# Patient Record
Sex: Male | Born: 1960
Health system: Southern US, Community
[De-identification: ages and names within clinical notes are randomized; demographics above are authoritative.]

## PROBLEM LIST (undated history)

## (undated) DIAGNOSIS — N183 Chronic kidney disease, stage 3 unspecified: Secondary | ICD-10-CM

## (undated) DIAGNOSIS — I255 Ischemic cardiomyopathy: Secondary | ICD-10-CM

## (undated) DIAGNOSIS — I10 Essential (primary) hypertension: Secondary | ICD-10-CM

## (undated) DIAGNOSIS — I951 Orthostatic hypotension: Secondary | ICD-10-CM

## (undated) DIAGNOSIS — E119 Type 2 diabetes mellitus without complications: Secondary | ICD-10-CM

## (undated) DIAGNOSIS — E1140 Type 2 diabetes mellitus with diabetic neuropathy, unspecified: Secondary | ICD-10-CM

## (undated) DIAGNOSIS — I2510 Atherosclerotic heart disease of native coronary artery without angina pectoris: Secondary | ICD-10-CM

## (undated) DIAGNOSIS — I739 Peripheral vascular disease, unspecified: Secondary | ICD-10-CM

## (undated) DIAGNOSIS — I5022 Chronic systolic (congestive) heart failure: Secondary | ICD-10-CM

## (undated) DIAGNOSIS — E7800 Pure hypercholesterolemia, unspecified: Secondary | ICD-10-CM

## (undated) DIAGNOSIS — T148XXA Other injury of unspecified body region, initial encounter: Secondary | ICD-10-CM

## (undated) DIAGNOSIS — D649 Anemia, unspecified: Secondary | ICD-10-CM

## (undated) DIAGNOSIS — L97509 Non-pressure chronic ulcer of other part of unspecified foot with unspecified severity: Secondary | ICD-10-CM

## (undated) DIAGNOSIS — E11621 Type 2 diabetes mellitus with foot ulcer: Secondary | ICD-10-CM

## (undated) DIAGNOSIS — I119 Hypertensive heart disease without heart failure: Secondary | ICD-10-CM

## (undated) DIAGNOSIS — I219 Acute myocardial infarction, unspecified: Secondary | ICD-10-CM

## (undated) DIAGNOSIS — I499 Cardiac arrhythmia, unspecified: Secondary | ICD-10-CM

## (undated) HISTORY — DX: Chronic kidney disease, stage 3 unspecified: N18.30

## (undated) HISTORY — PX: OTHER SURGICAL HISTORY: SHX169

## (undated) HISTORY — PX: CATARACT EXTRACTION: SUR2

## (undated) HISTORY — DX: Hypertensive heart disease without heart failure: I11.9

## (undated) HISTORY — DX: Chronic kidney disease, stage 3 (moderate): N18.3

## (undated) HISTORY — DX: Chronic systolic (congestive) heart failure: I50.22

## (undated) HISTORY — DX: Ischemic cardiomyopathy: I25.5

## (undated) HISTORY — PX: EYE SURGERY: SHX253

## (undated) HISTORY — DX: Atherosclerotic heart disease of native coronary artery without angina pectoris: I25.10

## (undated) HISTORY — DX: Type 2 diabetes mellitus without complications: E11.9

## (undated) HISTORY — PX: TONSILECTOMY/ADENOIDECTOMY WITH MYRINGOTOMY: SHX6125

## (undated) HISTORY — PX: TOE AMPUTATION: SHX809

## (undated) HISTORY — PX: CARDIAC CATHETERIZATION: SHX172

## (undated) HISTORY — DX: Essential (primary) hypertension: I10

## (undated) HISTORY — DX: Peripheral vascular disease, unspecified: I73.9

---

## 2013-07-18 LAB — CBC
HCT: 40.5 % (ref 40.0–52.0)
HGB: 13.7 g/dL (ref 13.0–18.0)
MCH: 30.2 pg (ref 26.0–34.0)
MCV: 89 fL (ref 80–100)
Platelet: 364 10*3/uL (ref 150–440)
RBC: 4.54 10*6/uL (ref 4.40–5.90)
RDW: 12.5 % (ref 11.5–14.5)
WBC: 23.4 10*3/uL — ABNORMAL HIGH (ref 3.8–10.6)

## 2013-07-18 LAB — COMPREHENSIVE METABOLIC PANEL
Alkaline Phosphatase: 101 U/L (ref 50–136)
BUN: 23 mg/dL — ABNORMAL HIGH (ref 7–18)
Chloride: 98 mmol/L (ref 98–107)
Creatinine: 1.18 mg/dL (ref 0.60–1.30)
EGFR (Non-African Amer.): >60
Glucose: 261 mg/dL — ABNORMAL HIGH (ref 65–99)
Potassium: 4 mmol/L (ref 3.5–5.1)
SGOT(AST): 11 U/L — ABNORMAL LOW (ref 15–37)
Sodium: 131 mmol/L — ABNORMAL LOW (ref 136–145)

## 2013-07-19 ENCOUNTER — Inpatient Hospital Stay: Payer: Self-pay | Admitting: Internal Medicine

## 2013-07-19 LAB — SEDIMENTATION RATE: Erythrocyte Sed Rate: 55 mm/hr — ABNORMAL HIGH (ref 0–20)

## 2013-07-19 LAB — HEMOGLOBIN A1C: Hemoglobin A1C: 12.2 % — ABNORMAL HIGH (ref 4.2–6.3)

## 2013-07-20 LAB — BASIC METABOLIC PANEL
Anion Gap: 6 — ABNORMAL LOW (ref 7–16)
Chloride: 100 mmol/L (ref 98–107)
Co2: 26 mmol/L (ref 21–32)
Creatinine: 0.96 mg/dL (ref 0.60–1.30)
EGFR (Non-African Amer.): >60
Glucose: 221 mg/dL — ABNORMAL HIGH (ref 65–99)
Sodium: 132 mmol/L — ABNORMAL LOW (ref 136–145)

## 2013-07-20 LAB — CBC WITH DIFFERENTIAL/PLATELET
Basophil #: 0 10*3/uL (ref 0.0–0.1)
Eosinophil #: 0.1 10*3/uL (ref 0.0–0.7)
HGB: 11.6 g/dL — ABNORMAL LOW (ref 13.0–18.0)
Lymphocyte %: 13.2 %
Monocyte #: 1.8 x10 3/mm — ABNORMAL HIGH (ref 0.2–1.0)
Monocyte %: 9.9 %
Neutrophil #: 13.6 10*3/uL — ABNORMAL HIGH (ref 1.4–6.5)
Platelet: 291 10*3/uL (ref 150–440)
WBC: 17.8 10*3/uL — ABNORMAL HIGH (ref 3.8–10.6)

## 2013-07-21 LAB — BASIC METABOLIC PANEL
Anion Gap: 8 (ref 7–16)
BUN: 13 mg/dL (ref 7–18)
Calcium, Total: 9.1 mg/dL (ref 8.5–10.1)
Chloride: 98 mmol/L (ref 98–107)
Co2: 26 mmol/L (ref 21–32)
Creatinine: 1.04 mg/dL (ref 0.60–1.30)
EGFR (African American): >60
Osmolality: 273 (ref 275–301)
Sodium: 132 mmol/L — ABNORMAL LOW (ref 136–145)

## 2013-07-21 LAB — CBC WITH DIFFERENTIAL/PLATELET
Basophil #: 0.1 10*3/uL (ref 0.0–0.1)
Eosinophil #: 0 10*3/uL (ref 0.0–0.7)
HGB: 12 g/dL — ABNORMAL LOW (ref 13.0–18.0)
Lymphocyte #: 1.8 10*3/uL (ref 1.0–3.6)
Lymphocyte %: 8.6 %
MCH: 31.2 pg (ref 26.0–34.0)
MCV: 87 fL (ref 80–100)
Monocyte %: 8.2 %
Neutrophil %: 82.7 %
Platelet: 296 10*3/uL (ref 150–440)
RBC: 3.86 10*6/uL — ABNORMAL LOW (ref 4.40–5.90)
RDW: 11.9 % (ref 11.5–14.5)
WBC: 21.3 10*3/uL — ABNORMAL HIGH (ref 3.8–10.6)

## 2013-07-21 LAB — VANCOMYCIN, TROUGH: Vancomycin, Trough: 22 ug/mL (ref 10–20)

## 2013-07-22 LAB — BASIC METABOLIC PANEL
Anion Gap: 7 (ref 7–16)
BUN: 16 mg/dL (ref 7–18)
Calcium, Total: 8.4 mg/dL — ABNORMAL LOW (ref 8.5–10.1)
Chloride: 99 mmol/L (ref 98–107)
Co2: 26 mmol/L (ref 21–32)
EGFR (Non-African Amer.): 42 — ABNORMAL LOW
Glucose: 200 mg/dL — ABNORMAL HIGH (ref 65–99)
Osmolality: 271 (ref 275–301)
Sodium: 132 mmol/L — ABNORMAL LOW (ref 136–145)

## 2013-07-22 LAB — VANCOMYCIN, TROUGH: Vancomycin, Trough: 33 ug/mL (ref 10–20)

## 2013-07-22 LAB — CBC WITH DIFFERENTIAL/PLATELET
Basophil #: 0.1 10*3/uL (ref 0.0–0.1)
Basophil %: 0.7 %
Eosinophil #: 0.1 10*3/uL (ref 0.0–0.7)
Eosinophil %: 0.3 %
HGB: 11.4 g/dL — ABNORMAL LOW (ref 13.0–18.0)
Lymphocyte #: 2.3 10*3/uL (ref 1.0–3.6)
MCH: 30 pg (ref 26.0–34.0)
MCHC: 34 g/dL (ref 32.0–36.0)
Monocyte #: 1.9 x10 3/mm — ABNORMAL HIGH (ref 0.2–1.0)
Platelet: 330 10*3/uL (ref 150–440)
RBC: 3.82 10*6/uL — ABNORMAL LOW (ref 4.40–5.90)
RDW: 12.3 % (ref 11.5–14.5)
WBC: 20.4 10*3/uL — ABNORMAL HIGH (ref 3.8–10.6)

## 2013-07-23 LAB — CBC WITH DIFFERENTIAL/PLATELET
Basophil %: 0.3 %
Eosinophil #: 0.1 10*3/uL (ref 0.0–0.7)
Eosinophil %: 0.8 %
HGB: 11.1 g/dL — ABNORMAL LOW (ref 13.0–18.0)
Lymphocyte #: 1.9 10*3/uL (ref 1.0–3.6)
Lymphocyte %: 10.9 %
MCV: 89 fL (ref 80–100)
Monocyte #: 1.8 x10 3/mm — ABNORMAL HIGH (ref 0.2–1.0)
Monocyte %: 10.3 %
Neutrophil #: 13.8 10*3/uL — ABNORMAL HIGH (ref 1.4–6.5)
Neutrophil %: 77.7 %
Platelet: 302 10*3/uL (ref 150–440)
RBC: 3.62 10*6/uL — ABNORMAL LOW (ref 4.40–5.90)
RDW: 12.6 % (ref 11.5–14.5)
WBC: 17.7 10*3/uL — ABNORMAL HIGH (ref 3.8–10.6)

## 2013-07-23 LAB — BASIC METABOLIC PANEL
Anion Gap: 8 (ref 7–16)
Co2: 24 mmol/L (ref 21–32)
Glucose: 127 mg/dL — ABNORMAL HIGH (ref 65–99)
Osmolality: 273 (ref 275–301)
Potassium: 4.9 mmol/L (ref 3.5–5.1)
Sodium: 133 mmol/L — ABNORMAL LOW (ref 136–145)

## 2013-07-23 LAB — CULTURE, BLOOD (SINGLE)

## 2013-07-24 LAB — COMPREHENSIVE METABOLIC PANEL
Albumin: 2 g/dL — ABNORMAL LOW (ref 3.4–5.0)
Alkaline Phosphatase: 99 U/L (ref 50–136)
Anion Gap: 10 (ref 7–16)
BUN: 35 mg/dL — ABNORMAL HIGH (ref 7–18)
Bilirubin,Total: 0.5 mg/dL (ref 0.2–1.0)
Chloride: 102 mmol/L (ref 98–107)
EGFR (African American): 20 — ABNORMAL LOW
EGFR (Non-African Amer.): 17 — ABNORMAL LOW
Glucose: 91 mg/dL (ref 65–99)
Potassium: 3.5 mmol/L (ref 3.5–5.1)
SGPT (ALT): 28 U/L (ref 12–78)
Sodium: 133 mmol/L — ABNORMAL LOW (ref 136–145)
Total Protein: 7.2 g/dL (ref 6.4–8.2)

## 2013-07-24 LAB — CBC WITH DIFFERENTIAL/PLATELET
Eosinophil #: 0.1 10*3/uL (ref 0.0–0.7)
Eosinophil %: 0.7 %
HCT: 34.1 % — ABNORMAL LOW (ref 40.0–52.0)
HGB: 11.6 g/dL — ABNORMAL LOW (ref 13.0–18.0)
MCV: 89 fL (ref 80–100)
Monocyte #: 1.5 x10 3/mm — ABNORMAL HIGH (ref 0.2–1.0)
Neutrophil #: 13.2 10*3/uL — ABNORMAL HIGH (ref 1.4–6.5)
Neutrophil %: 79.8 %
WBC: 16.5 10*3/uL — ABNORMAL HIGH (ref 3.8–10.6)

## 2013-07-24 LAB — VANCOMYCIN, RANDOM: Vancomycin, Random: 24 ug/mL

## 2013-07-25 LAB — URINALYSIS, COMPLETE
Bilirubin,UR: NEGATIVE
Glucose,UR: 50 mg/dL (ref 0–75)
Ketone: NEGATIVE
Leukocyte Esterase: NEGATIVE
Nitrite: NEGATIVE
Ph: 5 (ref 4.5–8.0)
Protein: 30
Specific Gravity: 1.008 (ref 1.003–1.030)
Squamous Epithelial: NONE SEEN
WBC UR: <1 /HPF (ref 0–5)

## 2013-07-25 LAB — BASIC METABOLIC PANEL
Anion Gap: 10 (ref 7–16)
BUN: 41 mg/dL — ABNORMAL HIGH (ref 7–18)
Chloride: 107 mmol/L (ref 98–107)
EGFR (African American): 17 — ABNORMAL LOW
EGFR (Non-African Amer.): 14 — ABNORMAL LOW
Potassium: 3.8 mmol/L (ref 3.5–5.1)

## 2013-07-25 LAB — CBC WITH DIFFERENTIAL/PLATELET
HGB: 11 g/dL — ABNORMAL LOW (ref 13.0–18.0)
Lymphocyte #: 1.5 10*3/uL (ref 1.0–3.6)
MCH: 30.3 pg (ref 26.0–34.0)
MCV: 88 fL (ref 80–100)
Monocyte #: 1.4 x10 3/mm — ABNORMAL HIGH (ref 0.2–1.0)
Neutrophil %: 80.4 %
RBC: 3.62 10*6/uL — ABNORMAL LOW (ref 4.40–5.90)
RDW: 12.8 % (ref 11.5–14.5)
WBC: 15.8 10*3/uL — ABNORMAL HIGH (ref 3.8–10.6)

## 2013-07-25 LAB — PROTEIN / CREATININE RATIO, URINE
Creatinine, Urine: 35.3 mg/dL (ref 30.0–125.0)
Protein, Random Urine: 35 mg/dL — ABNORMAL HIGH (ref 0–12)

## 2013-07-26 LAB — BASIC METABOLIC PANEL
BUN: 54 mg/dL — ABNORMAL HIGH (ref 7–18)
Calcium, Total: 9.2 mg/dL (ref 8.5–10.1)
Chloride: 104 mmol/L (ref 98–107)
Co2: 21 mmol/L (ref 21–32)
Creatinine: 4.88 mg/dL — ABNORMAL HIGH (ref 0.60–1.30)
EGFR (African American): 15 — ABNORMAL LOW
EGFR (Non-African Amer.): 13 — ABNORMAL LOW
Glucose: 130 mg/dL — ABNORMAL HIGH (ref 65–99)
Osmolality: 290 (ref 275–301)
Sodium: 137 mmol/L (ref 136–145)

## 2013-07-26 LAB — CBC WITH DIFFERENTIAL/PLATELET
Basophil #: 0.1 10*3/uL (ref 0.0–0.1)
Basophil %: 0.4 %
Eosinophil #: 0.2 10*3/uL (ref 0.0–0.7)
Eosinophil %: 1.4 %
HCT: 35.1 % — ABNORMAL LOW (ref 40.0–52.0)
Lymphocyte #: 2 10*3/uL (ref 1.0–3.6)
Lymphocyte %: 11.8 %
MCH: 29.8 pg (ref 26.0–34.0)
MCHC: 33.4 g/dL (ref 32.0–36.0)
Monocyte #: 1.2 x10 3/mm — ABNORMAL HIGH (ref 0.2–1.0)
Monocyte %: 7.1 %
Neutrophil #: 13.1 10*3/uL — ABNORMAL HIGH (ref 1.4–6.5)
Platelet: 439 10*3/uL (ref 150–440)
WBC: 16.6 10*3/uL — ABNORMAL HIGH (ref 3.8–10.6)

## 2013-07-27 LAB — CBC WITH DIFFERENTIAL/PLATELET
Basophil #: 0 10*3/uL (ref 0.0–0.1)
Eosinophil #: 0.3 10*3/uL (ref 0.0–0.7)
HCT: 31 % — ABNORMAL LOW (ref 40.0–52.0)
HGB: 10.4 g/dL — ABNORMAL LOW (ref 13.0–18.0)
MCH: 29.5 pg (ref 26.0–34.0)
MCHC: 33.5 g/dL (ref 32.0–36.0)
MCV: 88 fL (ref 80–100)
Monocyte %: 8.9 %
Neutrophil #: 12 10*3/uL — ABNORMAL HIGH (ref 1.4–6.5)
Platelet: 425 10*3/uL (ref 150–440)
RBC: 3.52 10*6/uL — ABNORMAL LOW (ref 4.40–5.90)
RDW: 12.5 % (ref 11.5–14.5)
WBC: 16.1 10*3/uL — ABNORMAL HIGH (ref 3.8–10.6)

## 2013-07-27 LAB — RENAL FUNCTION PANEL
Anion Gap: 11 (ref 7–16)
Calcium, Total: 8.3 mg/dL — ABNORMAL LOW (ref 8.5–10.1)
Co2: 22 mmol/L (ref 21–32)
Creatinine: 5.51 mg/dL — ABNORMAL HIGH (ref 0.60–1.30)
EGFR (African American): 13 — ABNORMAL LOW
Osmolality: 291 (ref 275–301)
Potassium: 3.7 mmol/L (ref 3.5–5.1)
Sodium: 137 mmol/L (ref 136–145)

## 2013-07-28 LAB — BASIC METABOLIC PANEL
BUN: 63 mg/dL — ABNORMAL HIGH (ref 7–18)
Calcium, Total: 8.3 mg/dL — ABNORMAL LOW (ref 8.5–10.1)
Co2: 20 mmol/L — ABNORMAL LOW (ref 21–32)
Glucose: 100 mg/dL — ABNORMAL HIGH (ref 65–99)
Potassium: 3.5 mmol/L (ref 3.5–5.1)

## 2013-07-28 LAB — CBC WITH DIFFERENTIAL/PLATELET
Basophil #: 0 10*3/uL (ref 0.0–0.1)
Basophil %: 0.2 %
Eosinophil #: 0.2 10*3/uL (ref 0.0–0.7)
Eosinophil %: 1.2 %
HCT: 30.6 % — ABNORMAL LOW (ref 40.0–52.0)
HGB: 10.3 g/dL — ABNORMAL LOW (ref 13.0–18.0)
Lymphocyte %: 13.5 %
Monocyte %: 8.9 %
Neutrophil %: 76.2 %
RBC: 3.47 10*6/uL — ABNORMAL LOW (ref 4.40–5.90)
RDW: 12.8 % (ref 11.5–14.5)

## 2013-07-28 LAB — WOUND CULTURE

## 2013-07-29 LAB — CBC WITH DIFFERENTIAL/PLATELET
Basophil #: 0.1 10*3/uL (ref 0.0–0.1)
Eosinophil %: 1.9 %
HCT: 32.8 % — ABNORMAL LOW (ref 40.0–52.0)
HGB: 11 g/dL — ABNORMAL LOW (ref 13.0–18.0)
MCH: 29.8 pg (ref 26.0–34.0)
MCHC: 33.6 g/dL (ref 32.0–36.0)
MCV: 89 fL (ref 80–100)
Monocyte #: 1 x10 3/mm (ref 0.2–1.0)
Monocyte %: 7.1 %

## 2013-07-29 LAB — BASIC METABOLIC PANEL
BUN: 67 mg/dL — ABNORMAL HIGH (ref 7–18)
Osmolality: 297 (ref 275–301)

## 2013-07-30 LAB — BASIC METABOLIC PANEL
Calcium, Total: 8.6 mg/dL (ref 8.5–10.1)
Creatinine: 5.4 mg/dL — ABNORMAL HIGH (ref 0.60–1.30)
EGFR (Non-African Amer.): 11 — ABNORMAL LOW
Glucose: 246 mg/dL — ABNORMAL HIGH (ref 65–99)
Osmolality: 301 (ref 275–301)
Potassium: 4.1 mmol/L (ref 3.5–5.1)
Sodium: 137 mmol/L (ref 136–145)

## 2013-07-31 LAB — CBC WITH DIFFERENTIAL/PLATELET
Basophil %: 0.7 %
Eosinophil #: 0.2 10*3/uL (ref 0.0–0.7)
Eosinophil %: 1.8 %
HGB: 10.8 g/dL — ABNORMAL LOW (ref 13.0–18.0)
Lymphocyte #: 2.7 10*3/uL (ref 1.0–3.6)
Lymphocyte %: 20.8 %
MCH: 29.8 pg (ref 26.0–34.0)
MCHC: 34 g/dL (ref 32.0–36.0)
MCV: 88 fL (ref 80–100)
Monocyte #: 1.2 x10 3/mm — ABNORMAL HIGH (ref 0.2–1.0)
Neutrophil #: 8.7 10*3/uL — ABNORMAL HIGH (ref 1.4–6.5)
Neutrophil %: 67.4 %
Platelet: 303 10*3/uL (ref 150–440)
RBC: 3.61 10*6/uL — ABNORMAL LOW (ref 4.40–5.90)
RDW: 13 % (ref 11.5–14.5)

## 2013-07-31 LAB — BASIC METABOLIC PANEL
Calcium, Total: 8.7 mg/dL (ref 8.5–10.1)
Creatinine: 5.13 mg/dL — ABNORMAL HIGH (ref 0.60–1.30)
Osmolality: 295 (ref 275–301)
Potassium: 3.7 mmol/L (ref 3.5–5.1)
Sodium: 138 mmol/L (ref 136–145)

## 2013-08-01 LAB — CBC WITH DIFFERENTIAL/PLATELET
Basophil #: 0.1 10*3/uL (ref 0.0–0.1)
Eosinophil #: 0.2 10*3/uL (ref 0.0–0.7)
HCT: 32.4 % — ABNORMAL LOW (ref 40.0–52.0)
HGB: 10.9 g/dL — ABNORMAL LOW (ref 13.0–18.0)
Lymphocyte #: 2.6 10*3/uL (ref 1.0–3.6)
Lymphocyte %: 20.4 %
Monocyte #: 1.2 x10 3/mm — ABNORMAL HIGH (ref 0.2–1.0)
Monocyte %: 8.9 %
Neutrophil %: 68.4 %
Platelet: 270 10*3/uL (ref 150–440)
WBC: 12.9 10*3/uL — ABNORMAL HIGH (ref 3.8–10.6)

## 2013-08-01 LAB — BASIC METABOLIC PANEL
Anion Gap: 9 (ref 7–16)
BUN: 69 mg/dL — ABNORMAL HIGH (ref 7–18)
Calcium, Total: 8.9 mg/dL (ref 8.5–10.1)
Chloride: 103 mmol/L (ref 98–107)
Co2: 26 mmol/L (ref 21–32)
EGFR (African American): 14 — ABNORMAL LOW
EGFR (Non-African Amer.): 12 — ABNORMAL LOW
Potassium: 3.9 mmol/L (ref 3.5–5.1)

## 2013-08-02 LAB — CBC WITH DIFFERENTIAL/PLATELET
Eosinophil #: 0.3 10*3/uL (ref 0.0–0.7)
Eosinophil %: 1.9 %
HCT: 30.5 % — ABNORMAL LOW (ref 40.0–52.0)
HGB: 10.3 g/dL — ABNORMAL LOW (ref 13.0–18.0)
Lymphocyte #: 2.7 10*3/uL (ref 1.0–3.6)
MCH: 29.8 pg (ref 26.0–34.0)
MCHC: 33.8 g/dL (ref 32.0–36.0)
MCV: 88 fL (ref 80–100)
Monocyte #: 1.3 x10 3/mm — ABNORMAL HIGH (ref 0.2–1.0)
Monocyte %: 8.9 %
Neutrophil #: 9.8 10*3/uL — ABNORMAL HIGH (ref 1.4–6.5)
Neutrophil %: 69.2 %
RBC: 3.45 10*6/uL — ABNORMAL LOW (ref 4.40–5.90)
WBC: 14.1 10*3/uL — ABNORMAL HIGH (ref 3.8–10.6)

## 2013-08-02 LAB — BASIC METABOLIC PANEL
Anion Gap: 9 (ref 7–16)
Chloride: 102 mmol/L (ref 98–107)
Co2: 26 mmol/L (ref 21–32)
Creatinine: 4.84 mg/dL — ABNORMAL HIGH (ref 0.60–1.30)
EGFR (Non-African Amer.): 13 — ABNORMAL LOW
Potassium: 3.9 mmol/L (ref 3.5–5.1)

## 2013-08-03 LAB — BASIC METABOLIC PANEL
Anion Gap: 7 (ref 7–16)
Chloride: 104 mmol/L (ref 98–107)
Sodium: 137 mmol/L (ref 136–145)

## 2013-08-03 LAB — CBC WITH DIFFERENTIAL/PLATELET
Basophil %: 0.8 %
Eosinophil #: 0.3 10*3/uL (ref 0.0–0.7)
Eosinophil %: 2.2 %
HGB: 10.8 g/dL — ABNORMAL LOW (ref 13.0–18.0)
Lymphocyte #: 2.4 10*3/uL (ref 1.0–3.6)
MCH: 30.2 pg (ref 26.0–34.0)
MCHC: 34.1 g/dL (ref 32.0–36.0)
MCV: 89 fL (ref 80–100)
Monocyte #: 1.1 x10 3/mm — ABNORMAL HIGH (ref 0.2–1.0)
Platelet: 246 10*3/uL (ref 150–440)
WBC: 11.6 10*3/uL — ABNORMAL HIGH (ref 3.8–10.6)

## 2013-08-04 LAB — BASIC METABOLIC PANEL
BUN: 67 mg/dL — ABNORMAL HIGH (ref 7–18)
Calcium, Total: 9 mg/dL (ref 8.5–10.1)
Chloride: 103 mmol/L (ref 98–107)
Co2: 28 mmol/L (ref 21–32)
Creatinine: 4.97 mg/dL — ABNORMAL HIGH (ref 0.60–1.30)
EGFR (African American): 15 — ABNORMAL LOW
EGFR (Non-African Amer.): 13 — ABNORMAL LOW
Glucose: 89 mg/dL (ref 65–99)
Osmolality: 295 (ref 275–301)
Sodium: 138 mmol/L (ref 136–145)

## 2013-08-04 LAB — CBC WITH DIFFERENTIAL/PLATELET
Basophil #: 0.1 10*3/uL (ref 0.0–0.1)
Basophil %: 0.9 %
Eosinophil #: 0.3 10*3/uL (ref 0.0–0.7)
Eosinophil %: 2.4 %
HCT: 31.7 % — ABNORMAL LOW (ref 40.0–52.0)
Lymphocyte #: 2.8 10*3/uL (ref 1.0–3.6)
MCH: 29.6 pg (ref 26.0–34.0)
MCHC: 33.4 g/dL (ref 32.0–36.0)
MCV: 89 fL (ref 80–100)
Monocyte #: 1.2 x10 3/mm — ABNORMAL HIGH (ref 0.2–1.0)
Monocyte %: 9.8 %
Neutrophil #: 7.6 10*3/uL — ABNORMAL HIGH (ref 1.4–6.5)
Neutrophil %: 63.8 %
Platelet: 257 10*3/uL (ref 150–440)
RBC: 3.57 10*6/uL — ABNORMAL LOW (ref 4.40–5.90)
WBC: 11.9 10*3/uL — ABNORMAL HIGH (ref 3.8–10.6)

## 2013-08-05 LAB — BASIC METABOLIC PANEL
Anion Gap: 6 — ABNORMAL LOW (ref 7–16)
BUN: 68 mg/dL — ABNORMAL HIGH (ref 7–18)
Calcium, Total: 8.8 mg/dL (ref 8.5–10.1)
Chloride: 102 mmol/L (ref 98–107)
Creatinine: 5.1 mg/dL — ABNORMAL HIGH (ref 0.60–1.30)
EGFR (Non-African Amer.): 12 — ABNORMAL LOW
Osmolality: 296 (ref 275–301)
Potassium: 3.9 mmol/L (ref 3.5–5.1)
Sodium: 137 mmol/L (ref 136–145)

## 2013-08-06 LAB — CBC WITH DIFFERENTIAL/PLATELET
Basophil %: 1.2 %
Eosinophil %: 2.5 %
HCT: 31.3 % — ABNORMAL LOW (ref 40.0–52.0)
HGB: 10.5 g/dL — ABNORMAL LOW (ref 13.0–18.0)
Lymphocyte #: 2.8 10*3/uL (ref 1.0–3.6)
Lymphocyte %: 27.6 %
MCH: 29.7 pg (ref 26.0–34.0)
MCHC: 33.6 g/dL (ref 32.0–36.0)
MCV: 89 fL (ref 80–100)
Monocyte #: 1 x10 3/mm (ref 0.2–1.0)
Platelet: 240 10*3/uL (ref 150–440)
RBC: 3.53 10*6/uL — ABNORMAL LOW (ref 4.40–5.90)
RDW: 12.8 % (ref 11.5–14.5)
WBC: 10.3 10*3/uL (ref 3.8–10.6)

## 2013-08-06 LAB — COMPREHENSIVE METABOLIC PANEL
Bilirubin,Total: 0.4 mg/dL (ref 0.2–1.0)
Calcium, Total: 8.9 mg/dL (ref 8.5–10.1)
Chloride: 102 mmol/L (ref 98–107)
Creatinine: 4.84 mg/dL — ABNORMAL HIGH (ref 0.60–1.30)
EGFR (African American): 15 — ABNORMAL LOW
EGFR (Non-African Amer.): 13 — ABNORMAL LOW
Glucose: 67 mg/dL (ref 65–99)
Osmolality: 293 (ref 275–301)
SGOT(AST): 13 U/L — ABNORMAL LOW (ref 15–37)
SGPT (ALT): 14 U/L (ref 12–78)
Sodium: 138 mmol/L (ref 136–145)

## 2013-08-07 LAB — CBC WITH DIFFERENTIAL/PLATELET
Eosinophil #: 0.3 10*3/uL (ref 0.0–0.7)
HGB: 10.6 g/dL — ABNORMAL LOW (ref 13.0–18.0)
Lymphocyte #: 2.8 10*3/uL (ref 1.0–3.6)
MCH: 29.3 pg (ref 26.0–34.0)
MCV: 88 fL (ref 80–100)
Monocyte #: 1.2 x10 3/mm — ABNORMAL HIGH (ref 0.2–1.0)
Neutrophil #: 7.2 10*3/uL — ABNORMAL HIGH (ref 1.4–6.5)
RBC: 3.62 10*6/uL — ABNORMAL LOW (ref 4.40–5.90)
WBC: 11.5 10*3/uL — ABNORMAL HIGH (ref 3.8–10.6)

## 2013-08-07 LAB — BASIC METABOLIC PANEL
Anion Gap: 6 — ABNORMAL LOW (ref 7–16)
BUN: 65 mg/dL — ABNORMAL HIGH (ref 7–18)
Creatinine: 4.67 mg/dL — ABNORMAL HIGH (ref 0.60–1.30)
EGFR (Non-African Amer.): 14 — ABNORMAL LOW
Glucose: 117 mg/dL — ABNORMAL HIGH (ref 65–99)
Osmolality: 292 (ref 275–301)
Sodium: 136 mmol/L (ref 136–145)

## 2013-08-07 LAB — PROTEIN / CREATININE RATIO, URINE: Creatinine, Urine: 65.8 mg/dL (ref 30.0–125.0)

## 2013-08-07 LAB — PROTIME-INR: INR: 1.1

## 2013-08-08 LAB — CBC WITH DIFFERENTIAL/PLATELET
Basophil #: 0.1 10*3/uL (ref 0.0–0.1)
Basophil %: 1.3 %
Eosinophil #: 0.3 10*3/uL (ref 0.0–0.7)
Eosinophil %: 2.5 %
HCT: 30.4 % — ABNORMAL LOW (ref 40.0–52.0)
Lymphocyte %: 24.1 %
MCH: 30.2 pg (ref 26.0–34.0)
Monocyte #: 1.1 x10 3/mm — ABNORMAL HIGH (ref 0.2–1.0)
Neutrophil #: 6.4 10*3/uL (ref 1.4–6.5)
Neutrophil %: 61.8 %
Platelet: 237 10*3/uL (ref 150–440)
RBC: 3.45 10*6/uL — ABNORMAL LOW (ref 4.40–5.90)
RDW: 12.9 % (ref 11.5–14.5)
WBC: 10.4 10*3/uL (ref 3.8–10.6)

## 2013-08-08 LAB — URINALYSIS, COMPLETE
Bacteria: NONE SEEN
Bilirubin,UR: NEGATIVE
Glucose,UR: NEGATIVE mg/dL (ref 0–75)
Ketone: NEGATIVE
Leukocyte Esterase: NEGATIVE
Nitrite: NEGATIVE
Ph: 7 (ref 4.5–8.0)
Protein: NEGATIVE
RBC,UR: 331 /HPF (ref 0–5)
Specific Gravity: 1.006 (ref 1.003–1.030)
Squamous Epithelial: NONE SEEN
WBC UR: NONE SEEN /HPF (ref 0–5)

## 2013-08-08 LAB — BASIC METABOLIC PANEL
Anion Gap: 8 (ref 7–16)
BUN: 60 mg/dL — ABNORMAL HIGH (ref 7–18)
Calcium, Total: 8.9 mg/dL (ref 8.5–10.1)
Creatinine: 4.72 mg/dL — ABNORMAL HIGH (ref 0.60–1.30)
EGFR (African American): 16 — ABNORMAL LOW
EGFR (Non-African Amer.): 13 — ABNORMAL LOW
Glucose: 64 mg/dL — ABNORMAL LOW (ref 65–99)
Osmolality: 289 (ref 275–301)
Potassium: 3.8 mmol/L (ref 3.5–5.1)

## 2013-08-09 LAB — CBC WITH DIFFERENTIAL/PLATELET
Basophil #: 0.1 10*3/uL (ref 0.0–0.1)
Basophil %: 1.4 %
Eosinophil %: 2.8 %
HGB: 10.7 g/dL — ABNORMAL LOW (ref 13.0–18.0)
Lymphocyte #: 2.8 10*3/uL (ref 1.0–3.6)
MCH: 30.2 pg (ref 26.0–34.0)
MCHC: 34.4 g/dL (ref 32.0–36.0)
Monocyte #: 1.1 x10 3/mm — ABNORMAL HIGH (ref 0.2–1.0)
Neutrophil #: 5.8 10*3/uL (ref 1.4–6.5)
Neutrophil %: 56.9 %
RDW: 12.9 % (ref 11.5–14.5)
WBC: 10.2 10*3/uL (ref 3.8–10.6)

## 2013-08-09 LAB — BASIC METABOLIC PANEL
Anion Gap: 6 — ABNORMAL LOW (ref 7–16)
Chloride: 100 mmol/L (ref 98–107)
Creatinine: 4.47 mg/dL — ABNORMAL HIGH (ref 0.60–1.30)
EGFR (Non-African Amer.): 14 — ABNORMAL LOW
Osmolality: 286 (ref 275–301)

## 2013-08-10 LAB — BASIC METABOLIC PANEL
BUN: 63 mg/dL — ABNORMAL HIGH (ref 7–18)
Calcium, Total: 9 mg/dL (ref 8.5–10.1)
Chloride: 101 mmol/L (ref 98–107)
Co2: 28 mmol/L (ref 21–32)
EGFR (Non-African Amer.): 14 — ABNORMAL LOW
Sodium: 135 mmol/L — ABNORMAL LOW (ref 136–145)

## 2013-09-19 ENCOUNTER — Ambulatory Visit: Payer: Self-pay | Admitting: Podiatry

## 2013-09-19 LAB — BASIC METABOLIC PANEL
BUN: 40 mg/dL — ABNORMAL HIGH (ref 7–18)
Chloride: 103 mmol/L (ref 98–107)
Co2: 27 mmol/L (ref 21–32)
EGFR (Non-African Amer.): 35 — ABNORMAL LOW
Glucose: 264 mg/dL — ABNORMAL HIGH (ref 65–99)
Osmolality: 289 (ref 275–301)
Potassium: 4.9 mmol/L (ref 3.5–5.1)

## 2013-09-22 ENCOUNTER — Ambulatory Visit: Payer: Self-pay | Admitting: Podiatry

## 2013-09-26 LAB — PATHOLOGY REPORT

## 2014-04-03 ENCOUNTER — Ambulatory Visit: Payer: Self-pay | Admitting: Cardiovascular Disease

## 2014-04-04 ENCOUNTER — Institutional Professional Consult (permissible substitution) (INDEPENDENT_AMBULATORY_CARE_PROVIDER_SITE_OTHER): Payer: BLUE CROSS/BLUE SHIELD | Admitting: Surgery

## 2014-04-04 ENCOUNTER — Encounter: Payer: Self-pay | Admitting: Surgery

## 2014-04-04 ENCOUNTER — Other Ambulatory Visit: Payer: Self-pay

## 2014-04-04 VITALS — BP 99/71 | HR 88 | Resp 20 | Ht 73.0 in | Wt 225.0 lb

## 2014-04-04 DIAGNOSIS — I2510 Atherosclerotic heart disease of native coronary artery without angina pectoris: Secondary | ICD-10-CM

## 2014-04-04 NOTE — Progress Notes (Signed)
Cardiothoracic Surgery consultation   PCP is Tracie Harrier, MD Referring Provider is Thresa Ross, MD  Chief Complaint  Patient presents with  . Coronary Artery Disease    Surgical eval, Cardiac Cath 04/03/14 @ [facility]    HPI:  The patient is a 53 year old gentleman with a long history of diabetes and hypertension and a family history of premature coronary artery disease who underwent surgery on his left foot in September 2014 for a non-healing wound. 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 Postop he reports having shortness of breath and pulmonary edema leading to a cardiac workup at [facility] with an echo showing a low ejection fraction of 15%. His wife says that he developed renal failure with a creatinine that rose to over 5 and it was felt that he had nephrotoxicity due to an antibiotic. He had a kidney biopsy and was treated with steroids with gradual improvement. He was in the hospital for several weeks and discharged with an external defibrillator. He was treated medically and was found to have an improvement in his EF. The external defibrillator was removed. He had a stress cardiolyte in March 2015 showing a fixed inferior and reversible lateral defect with an EF of 40%. He reports intermittent atypical chest discomfort that he describes as a pain on the outside of his chest with sensitivity of the skin to touch. He denies any chest pressure or heaviness. He has had some exertional dyspnea and has been very fatigued. He has had dizziness and some falls that he thinks is due to the medication he is on.  Past Medical History  Diagnosis Date  . CAD (coronary artery disease)   . Cardiomyopathy   . Chest pain   . Congestive heart failure   . Diabetes   . Dyspnea   . Hypertension   . Peripheral vascular disease     Past Surgical History  Procedure Laterality Date  . Left foot osteomyelitis and wound after surgery    . Tonsilectomy/adenoidectomy with myringotomy    .  Cataract extraction      Family History  Problem Relation Age of Onset  . Heart disease Father     CABG in his 83's  . Diabetes type II    . Kidney disease    . Hypertension      Social History History  Substance Use Topics  . Smoking status: Never Smoker   . Smokeless tobacco: Never Used  . Alcohol Use: No    Current Outpatient Prescriptions  Medication Sig Dispense Refill  . aspirin 81 MG tablet Take 81 mg by mouth daily.      . carvedilol (COREG) 25 MG tablet Take 25 mg by mouth 2 (two) times daily with a meal.       . gabapentin (NEURONTIN) 100 MG capsule Take 100 mg by mouth 2 (two) times daily. PRN      . glyBURIDE (DIABETA) 5 MG tablet Take 5 mg by mouth 2 (two) times daily with a meal.       . hydrALAZINE (APRESOLINE) 25 MG tablet Take 25 mg by mouth 3 (three) times daily.      . insulin aspart (NOVOLOG FLEXPEN) 100 UNIT/ML FlexPen Inject into the skin 3 (three) times daily with meals.      . isosorbide mononitrate (IMDUR) 30 MG 24 hr tablet Take 30 mg by mouth daily.        No current facility-administered medications for this visit.    No Known Allergies  Review of Systems  Constitutional: Positive for activity change, appetite change and fatigue. Negative for fever, chills, diaphoresis and unexpected weight change.  HENT: Negative.   Eyes:       Describes intermittent bilateral loss of vision when standing and only sees white color. No amaurosis.   Respiratory: Positive for shortness of breath. Negative for cough and chest tightness.   Cardiovascular: Positive for chest pain. Negative for palpitations and leg swelling.  Gastrointestinal: Negative.   Endocrine: Negative.   Genitourinary: Negative.   Musculoskeletal:       Had amputation of the left 3rd and 4th toe and has been told that he needs amputation of the left 2nd and 5th toes. He has poor vascular supply to the left foot.  Skin: Negative.   Allergic/Immunologic: Negative.   Neurological: Positive  for dizziness and light-headedness. Negative for seizures, syncope, speech difficulty, numbness and headaches.  Hematological: Negative.   Psychiatric/Behavioral: Negative.     BP 99/71  Pulse 88  Resp 20  Ht 6\' 1"  (1.854 m)  Wt 225 lb (102.059 kg)  BMI 29.69 kg/m2  SpO2 94% Physical Exam  Constitutional: He is oriented to person, place, and time.  Chronically-ill appearing white male in no distress.  HENT:  Head: Normocephalic and atraumatic.  Mouth/Throat: Oropharynx is clear and moist.  Eyes: EOM are normal. Pupils are equal, round, and reactive to light.  Neck: Normal range of motion. Neck supple. No JVD present. No thyromegaly present.  Cardiovascular: Normal rate, regular rhythm and normal heart sounds.   No murmur heard. Pedal pulses not palpable  Pulmonary/Chest: Effort normal and breath sounds normal. No respiratory distress. He has no rales. He exhibits no tenderness.  Abdominal: Soft. Bowel sounds are normal. He exhibits no distension and no mass. There is no tenderness.  Musculoskeletal: He exhibits no edema.  Left 3rd and 4th toe amputation with exposed cartilage or bone and open wound. Does not appear infected.  Lymphadenopathy:    He has no cervical adenopathy.  Neurological: He is alert and oriented to person, place, and time. He has normal strength. No cranial nerve deficit or sensory deficit.  Skin: Skin is warm and dry.  Psychiatric: He has a normal mood and affect.     Diagnostic Tests:  Cardiac Cath at [facility] reviewed on CD. This shows diffuse narrowing of the mid LAD to about 70%. The 2nd diagonal is a moderat-sized vessel with a 70% stenosis. The LCX gives off a single large OM with an 80% stenosis at the bifurcation of this vessel from the LCX. The distal LCX is small. The RCA has a 99% proximal stenosis and then is occluded in the mid portion with left to right collat filling the distal vessel. LVEF is 40%.   Impression:  He has severe multi-vessel  coronary artery disease with moderate LV dysfunction and CCS class III symptoms. I agree that CABG is the best treatment for him. His wife says that his creatinine was 1.7 before cath yesterday so I think we should wait at least a weak to let his kidneys recover from the cath before doing surgery. He and wife would like to wait until June 15th when she is finished the school year since she is a Control and instrumentation engineer. I discussed the operative procedure with the patient and his wife including alternatives, benefits and risks; including but not limited to bleeding, blood transfusion, infection, stroke, myocardial infarction, graft failure, heart block requiring a permanent pacemaker, organ dysfunction, and death.  Arnette Norris  Nunzio Cory understands and agrees to proceed.    Plan:  We will schedule CABG surgery for Monday 04/30/2014 at his request. He will contact us if his symptoms worsen so that the date can be moved up. He and wife understand that he could have more problems including an MI and death while waiting for surgery.

## 2014-04-24 ENCOUNTER — Other Ambulatory Visit: Payer: Self-pay | Admitting: *Deleted

## 2014-04-24 DIAGNOSIS — I2510 Atherosclerotic heart disease of native coronary artery without angina pectoris: Secondary | ICD-10-CM

## 2014-04-26 ENCOUNTER — Other Ambulatory Visit (HOSPITAL_COMMUNITY): Payer: BLUE CROSS/BLUE SHIELD

## 2014-04-26 ENCOUNTER — Ambulatory Visit (HOSPITAL_COMMUNITY): Payer: BC Managed Care – PPO

## 2014-04-26 ENCOUNTER — Encounter (HOSPITAL_COMMUNITY): Payer: BLUE CROSS/BLUE SHIELD

## 2014-05-01 ENCOUNTER — Encounter (HOSPITAL_COMMUNITY): Payer: Self-pay | Admitting: Pharmacy Technician

## 2014-05-03 ENCOUNTER — Encounter (HOSPITAL_COMMUNITY)
Admission: RE | Admit: 2014-05-03 | Discharge: 2014-05-03 | Disposition: A | Discharge status: Home / Self Care | Payer: BC Managed Care – PPO | Source: Ambulatory Visit | Attending: Surgery | Admitting: Surgery

## 2014-05-03 ENCOUNTER — Encounter (HOSPITAL_COMMUNITY): Payer: Self-pay

## 2014-05-03 ENCOUNTER — Ambulatory Visit (HOSPITAL_COMMUNITY)
Admission: RE | Admit: 2014-05-03 | Discharge: 2014-05-03 | Disposition: A | Discharge status: Home / Self Care | Payer: BC Managed Care – PPO | Source: Ambulatory Visit | Attending: Surgery | Admitting: Surgery

## 2014-05-03 VITALS — BP 95/68 | HR 79 | Temp 98.2°F | Resp 20 | Ht 73.0 in | Wt 228.0 lb

## 2014-05-03 DIAGNOSIS — I2510 Atherosclerotic heart disease of native coronary artery without angina pectoris: Secondary | ICD-10-CM

## 2014-05-03 DIAGNOSIS — Z01811 Encounter for preprocedural respiratory examination: Secondary | ICD-10-CM | POA: Insufficient documentation

## 2014-05-03 DIAGNOSIS — Z01810 Encounter for preprocedural cardiovascular examination: Secondary | ICD-10-CM

## 2014-05-03 HISTORY — DX: Type 2 diabetes mellitus with foot ulcer: L97.509

## 2014-05-03 HISTORY — DX: Type 2 diabetes mellitus with diabetic neuropathy, unspecified: E11.40

## 2014-05-03 HISTORY — DX: Type 2 diabetes mellitus with foot ulcer: E11.621

## 2014-05-03 HISTORY — DX: Pure hypercholesterolemia, unspecified: E78.00

## 2014-05-03 HISTORY — DX: Acute myocardial infarction, unspecified: I21.9

## 2014-05-03 LAB — URINALYSIS, ROUTINE W REFLEX MICROSCOPIC
GLUCOSE, UA: NEGATIVE mg/dL
Hgb urine dipstick: NEGATIVE
Ketones, ur: NEGATIVE mg/dL
Leukocytes, UA: NEGATIVE
Nitrite: NEGATIVE
Protein, ur: 30 mg/dL — AB
SPECIFIC GRAVITY, URINE: 1.028 (ref 1.005–1.030)
UROBILINOGEN UA: 1 mg/dL (ref 0.0–1.0)
pH: 5 (ref 5.0–8.0)

## 2014-05-03 LAB — CBC
HCT: 33.2 % — ABNORMAL LOW (ref 39.0–52.0)
Hemoglobin: 11 g/dL — ABNORMAL LOW (ref 13.0–17.0)
MCH: 28.9 pg (ref 26.0–34.0)
MCHC: 33.1 g/dL (ref 30.0–36.0)
MCV: 87.4 fL (ref 78.0–100.0)
PLATELETS: 230 10*3/uL (ref 150–400)
RBC: 3.8 MIL/uL — ABNORMAL LOW (ref 4.22–5.81)
RDW: 13.1 % (ref 11.5–15.5)
WBC: 7.4 10*3/uL (ref 4.0–10.5)

## 2014-05-03 LAB — COMPREHENSIVE METABOLIC PANEL
ALT: 12 U/L (ref 0–53)
AST: 14 U/L (ref 0–37)
Albumin: 3.9 g/dL (ref 3.5–5.2)
Alkaline Phosphatase: 61 U/L (ref 39–117)
BUN: 34 mg/dL — ABNORMAL HIGH (ref 6–23)
CALCIUM: 9.6 mg/dL (ref 8.4–10.5)
CO2: 17 mEq/L — ABNORMAL LOW (ref 19–32)
CREATININE: 1.62 mg/dL — AB (ref 0.50–1.35)
Chloride: 104 mEq/L (ref 96–112)
GFR calc Af Amer: 55 mL/min — ABNORMAL LOW (ref >90)
GFR calc non Af Amer: 48 mL/min — ABNORMAL LOW (ref >90)
Glucose, Bld: 128 mg/dL — ABNORMAL HIGH (ref 70–99)
Potassium: 4.5 mEq/L (ref 3.7–5.3)
SODIUM: 138 meq/L (ref 137–147)
TOTAL PROTEIN: 8 g/dL (ref 6.0–8.3)
Total Bilirubin: 0.4 mg/dL (ref 0.3–1.2)

## 2014-05-03 LAB — PROTIME-INR
INR: 1.05 (ref 0.00–1.49)
PROTHROMBIN TIME: 13.5 s (ref 11.6–15.2)

## 2014-05-03 LAB — URINE MICROSCOPIC-ADD ON

## 2014-05-03 LAB — BLOOD GAS, ARTERIAL
Acid-base deficit: 4.3 mmol/L — ABNORMAL HIGH (ref 0.0–2.0)
Bicarbonate: 19.8 mEq/L — ABNORMAL LOW (ref 20.0–24.0)
Drawn by: 344381
FIO2: 0.21 %
O2 Saturation: 99.8 %
PATIENT TEMPERATURE: 98.6
PO2 ART: 115 mmHg — AB (ref 80.0–100.0)
TCO2: 20.8 mmol/L (ref 0–100)
pCO2 arterial: 33.3 mmHg — ABNORMAL LOW (ref 35.0–45.0)
pH, Arterial: 7.392 (ref 7.350–7.450)

## 2014-05-03 LAB — APTT: aPTT: 33 seconds (ref 24–37)

## 2014-05-03 LAB — ABO/RH: ABO/RH(D): A POS

## 2014-05-03 LAB — SURGICAL PCR SCREEN
MRSA, PCR: NEGATIVE
Staphylococcus aureus: POSITIVE — AB

## 2014-05-03 LAB — HEMOGLOBIN A1C
Hgb A1c MFr Bld: 6.5 % — ABNORMAL HIGH (ref <5.7)
MEAN PLASMA GLUCOSE: 140 mg/dL — AB (ref <117)

## 2014-05-03 MED ORDER — ALBUTEROL SULFATE (2.5 MG/3ML) 0.083% IN NEBU
2.5000 mg | INHALATION_SOLUTION | Freq: Once | RESPIRATORY_TRACT | Status: AC
  Administered 2014-05-03: 2.5 mg via RESPIRATORY_TRACT

## 2014-05-03 NOTE — Pre-Procedure Instructions (Signed)
Basil Gadison  05/03/2014   Your procedure is scheduled on: Monday, May 07, 2014  Report to [facility] (use Main Entrance "A'') at 5:30 AM.  Call this number if you have problems the morning of surgery: [phone]   Remember: Bring cardiac booklet and Incentive Spirometer on day of procedure.   Do not eat food or drink liquids after midnight Sunday, May 06, 2014   Take these medicines the morning of surgery with A SIP OF WATER: carvedilol (COREG), gabapentin (NEURONTIN), hydrALAZINE (APRESOLINE)   Do not take any NSAIDs ie: Ibuprofen, Advil, Naproxen or any medication containing Aspirin ; stop now.  Do not wear jewelry, make-up or nail polish.  Do not wear lotions, powders, or perfumes. You may NOT wear deodorant.  Do not shave 48 hours prior to surgery. Men may shave face and neck.  Do not bring valuables to the hospital.  [facility] is not responsible for any belongings or valuables.               Contacts, dentures or bridgework may not be worn into surgery.  Leave suitcase in the car. After surgery it may be brought to your room.  For patients admitted to the hospital, discharge time is determined by your treatment team.               Patients discharged the day of surgery will not be allowed to drive home.  Name and phone number of your driver:   Special Instructions:  Special Instructions:Special Instructions: [facility] - Preparing for Surgery  Before surgery, you can play an important role.  Because skin is not sterile, your skin needs to be as free of germs as possible.  You can reduce the number of germs on you skin by washing with CHG (chlorahexidine gluconate) soap before surgery.  CHG is an antiseptic cleaner which kills germs and bonds with the skin to continue killing germs even after washing.  Please DO NOT use if you have an allergy to CHG or antibacterial soaps.  If your skin becomes reddened/irritated stop using the CHG and inform your nurse when you  arrive at Short Stay.  Do not shave (including legs and underarms) for at least 48 hours prior to the first CHG shower.  You may shave your face.  Please follow these instructions carefully:   1.  Shower with CHG Soap the night before surgery and the morning of Surgery.  2.  If you choose to wash your hair, wash your hair first as usual with your normal shampoo.  3.  After you shampoo, rinse your hair and body thoroughly to remove the Shampoo.  4.  Use CHG as you would any other liquid soap.  You can apply chg directly  to the skin and wash gently with scrungie or a clean washcloth.  5.  Apply the CHG Soap to your body ONLY FROM THE NECK DOWN.  Do not use on open wounds or open sores.  Avoid contact with your eyes, ears, mouth and genitals (private parts).  Wash genitals (private parts) with your normal soap.  6.  Wash thoroughly, paying special attention to the area where your surgery will be performed.  7.  Thoroughly rinse your body with warm water from the neck down.  8.  DO NOT shower/wash with your normal soap after using and rinsing off the CHG Soap.  9.  Pat yourself dry with a clean towel.  10.  Wear clean pajamas.            11.  Place clean sheets on your bed the night of your first shower and do not sleep with pets.  Day of Surgery  Do not apply any lotions/deodorants the morning of surgery.  Please wear clean clothes to the hospital/surgery center.   Please read over the following fact sheets that you were given: Pain Booklet, Coughing and Deep Breathing, Blood Transfusion Information, Open Heart Packet, MRSA Information and Surgical Site Infection Prevention

## 2014-05-03 NOTE — Progress Notes (Signed)
VASCULAR LAB PRELIMINARY  PRELIMINARY  PRELIMINARY  PRELIMINARY  Pre-op Cardiac Surgery  Carotid Findings:  Bilateral:  1-39% ICA stenosis.  Vertebral artery flow is antegrade.      Upper Extremity Right Left  Brachial Pressures 138 Triphasic 139 Triphasic  Radial Waveforms Triphasic Triphasic  Ulnar Waveforms Triphasic Triphasic  Palmar Arch (Allen's Test) Normal Normal   Findings:  Doppler waveforms remained normal bilaterally with both radial and ulnar compressions.    Lower  Extremity Right Left  Dorsalis Pedis 176 Triphasic 171 Triphasic  Posterior Tibial 141 Triphasic 159 Biphasic  Ankle/Brachial Indices 1.26 1.23    Findings:  ABIs and Doppler waveforms are within normal limits bilaterally at rest   SLAUGHTER, [place], RVS 05/03/2014, 1:41 PM

## 2014-05-03 NOTE — Progress Notes (Signed)
Pt stated that he is allergic to an antibiotic ( name unknown); pt will contact prescribing physician to get the name and will provide info on DOS. Pt stated that he had an EKG in May 2015 at [facility]. Results requested and sent; pt needs EKG on DOS. Pt chart forwarded to anesthesia regarding abnormal lab ( creatinine 1.62).

## 2014-05-06 MED ORDER — DEXTROSE 5 % IV SOLN
1.5000 g | INTRAVENOUS | Status: AC
  Administered 2014-05-07: .75 g via INTRAVENOUS
  Administered 2014-05-07: 1.5 g via INTRAVENOUS
  Filled 2014-05-06: qty 1.5

## 2014-05-06 MED ORDER — DEXMEDETOMIDINE HCL IN NACL 400 MCG/100ML IV SOLN
0.1000 ug/kg/h | INTRAVENOUS | Status: AC
  Administered 2014-05-07: 0.3 ug/kg/h via INTRAVENOUS
  Filled 2014-05-06: qty 100

## 2014-05-06 MED ORDER — DOPAMINE-DEXTROSE 3.2-5 MG/ML-% IV SOLN
2.0000 ug/kg/min | INTRAVENOUS | Status: AC
  Administered 2014-05-07: 2 ug/kg/min via INTRAVENOUS
  Filled 2014-05-06: qty 250

## 2014-05-06 MED ORDER — SODIUM CHLORIDE 0.9 % IV SOLN
INTRAVENOUS | Status: AC
  Administered 2014-05-07: 1 [IU]/h via INTRAVENOUS
  Filled 2014-05-06: qty 1

## 2014-05-06 MED ORDER — EPINEPHRINE HCL 1 MG/ML IJ SOLN
0.5000 ug/min | INTRAVENOUS | Status: DC
  Filled 2014-05-06: qty 4

## 2014-05-06 MED ORDER — NITROGLYCERIN IN D5W 200-5 MCG/ML-% IV SOLN
2.0000 ug/min | INTRAVENOUS | Status: AC
  Administered 2014-05-07: 5 ug/min via INTRAVENOUS
  Filled 2014-05-06 (×2): qty 250

## 2014-05-06 MED ORDER — SODIUM CHLORIDE 0.9 % IV SOLN
INTRAVENOUS | Status: AC
  Administered 2014-05-07: 69.8 mL/h via INTRAVENOUS
  Administered 2014-05-07: 10:00:00 via INTRAVENOUS
  Filled 2014-05-06: qty 40

## 2014-05-06 MED ORDER — VANCOMYCIN HCL 10 G IV SOLR
1500.0000 mg | INTRAVENOUS | Status: AC
  Administered 2014-05-07: 1500 mg via INTRAVENOUS
  Filled 2014-05-06: qty 1500

## 2014-05-06 MED ORDER — PHENYLEPHRINE HCL 10 MG/ML IJ SOLN
30.0000 ug/min | INTRAVENOUS | Status: AC
  Administered 2014-05-07: 20 ug/min via INTRAVENOUS
  Filled 2014-05-06: qty 2

## 2014-05-06 MED ORDER — MAGNESIUM SULFATE 50 % IJ SOLN
40.0000 meq | INTRAMUSCULAR | Status: DC
  Filled 2014-05-06: qty 10

## 2014-05-06 MED ORDER — DEXTROSE 5 % IV SOLN
750.0000 mg | INTRAVENOUS | Status: DC
  Filled 2014-05-06: qty 750

## 2014-05-06 MED ORDER — SODIUM CHLORIDE 0.9 % IV SOLN
INTRAVENOUS | Status: DC
  Filled 2014-05-06: qty 30

## 2014-05-06 MED ORDER — POTASSIUM CHLORIDE 2 MEQ/ML IV SOLN
80.0000 meq | INTRAVENOUS | Status: DC
  Filled 2014-05-06: qty 40

## 2014-05-06 MED ORDER — PLASMA-LYTE 148 IV SOLN
INTRAVENOUS | Status: AC
  Administered 2014-05-07: 10:00:00
  Filled 2014-05-06: qty 2.5

## 2014-05-07 ENCOUNTER — Encounter (HOSPITAL_COMMUNITY)
Admission: RE | Disposition: A | Discharge status: Rehabilitation Facility | Payer: BC Managed Care – PPO | Source: Ambulatory Visit | Attending: Surgery

## 2014-05-07 ENCOUNTER — Inpatient Hospital Stay (HOSPITAL_COMMUNITY)
Admission: RE | Admit: 2014-05-07 | Discharge: 2014-05-15 | DRG: 236 | Disposition: A | Discharge status: Rehabilitation Facility | Payer: BC Managed Care – PPO | Source: Ambulatory Visit | Attending: Surgery | Admitting: Surgery

## 2014-05-07 ENCOUNTER — Encounter (HOSPITAL_COMMUNITY): Payer: BC Managed Care – PPO | Admitting: Anesthesiology

## 2014-05-07 ENCOUNTER — Inpatient Hospital Stay (HOSPITAL_COMMUNITY): Payer: BC Managed Care – PPO | Admitting: Anesthesiology

## 2014-05-07 ENCOUNTER — Encounter (HOSPITAL_COMMUNITY): Payer: Self-pay | Admitting: *Deleted

## 2014-05-07 ENCOUNTER — Inpatient Hospital Stay (HOSPITAL_COMMUNITY): Payer: BC Managed Care – PPO

## 2014-05-07 DIAGNOSIS — L97509 Non-pressure chronic ulcer of other part of unspecified foot with unspecified severity: Secondary | ICD-10-CM | POA: Diagnosis present

## 2014-05-07 DIAGNOSIS — J9383 Other pneumothorax: Secondary | ICD-10-CM | POA: Diagnosis not present

## 2014-05-07 DIAGNOSIS — N486 Induration penis plastica: Secondary | ICD-10-CM | POA: Diagnosis present

## 2014-05-07 DIAGNOSIS — E7800 Pure hypercholesterolemia, unspecified: Secondary | ICD-10-CM | POA: Diagnosis present

## 2014-05-07 DIAGNOSIS — M9080 Osteopathy in diseases classified elsewhere, unspecified site: Secondary | ICD-10-CM | POA: Diagnosis present

## 2014-05-07 DIAGNOSIS — Z7982 Long term (current) use of aspirin: Secondary | ICD-10-CM

## 2014-05-07 DIAGNOSIS — I509 Heart failure, unspecified: Secondary | ICD-10-CM | POA: Diagnosis present

## 2014-05-07 DIAGNOSIS — I252 Old myocardial infarction: Secondary | ICD-10-CM

## 2014-05-07 DIAGNOSIS — Z79899 Other long term (current) drug therapy: Secondary | ICD-10-CM

## 2014-05-07 DIAGNOSIS — I129 Hypertensive chronic kidney disease with stage 1 through stage 4 chronic kidney disease, or unspecified chronic kidney disease: Secondary | ICD-10-CM | POA: Diagnosis present

## 2014-05-07 DIAGNOSIS — E1169 Type 2 diabetes mellitus with other specified complication: Secondary | ICD-10-CM

## 2014-05-07 DIAGNOSIS — E1165 Type 2 diabetes mellitus with hyperglycemia: Secondary | ICD-10-CM | POA: Diagnosis present

## 2014-05-07 DIAGNOSIS — Z951 Presence of aortocoronary bypass graft: Secondary | ICD-10-CM

## 2014-05-07 DIAGNOSIS — S98139A Complete traumatic amputation of one unspecified lesser toe, initial encounter: Secondary | ICD-10-CM

## 2014-05-07 DIAGNOSIS — Z888 Allergy status to other drugs, medicaments and biological substances status: Secondary | ICD-10-CM

## 2014-05-07 DIAGNOSIS — I739 Peripheral vascular disease, unspecified: Secondary | ICD-10-CM | POA: Diagnosis present

## 2014-05-07 DIAGNOSIS — Z833 Family history of diabetes mellitus: Secondary | ICD-10-CM

## 2014-05-07 DIAGNOSIS — IMO0002 Reserved for concepts with insufficient information to code with codable children: Secondary | ICD-10-CM | POA: Diagnosis present

## 2014-05-07 DIAGNOSIS — R339 Retention of urine, unspecified: Secondary | ICD-10-CM | POA: Diagnosis not present

## 2014-05-07 DIAGNOSIS — E118 Type 2 diabetes mellitus with unspecified complications: Secondary | ICD-10-CM

## 2014-05-07 DIAGNOSIS — R5381 Other malaise: Secondary | ICD-10-CM | POA: Diagnosis not present

## 2014-05-07 DIAGNOSIS — D62 Acute posthemorrhagic anemia: Secondary | ICD-10-CM | POA: Diagnosis not present

## 2014-05-07 DIAGNOSIS — E1142 Type 2 diabetes mellitus with diabetic polyneuropathy: Secondary | ICD-10-CM | POA: Diagnosis present

## 2014-05-07 DIAGNOSIS — E1149 Type 2 diabetes mellitus with other diabetic neurological complication: Secondary | ICD-10-CM | POA: Diagnosis present

## 2014-05-07 DIAGNOSIS — N189 Chronic kidney disease, unspecified: Secondary | ICD-10-CM | POA: Diagnosis present

## 2014-05-07 DIAGNOSIS — Z8249 Family history of ischemic heart disease and other diseases of the circulatory system: Secondary | ICD-10-CM

## 2014-05-07 DIAGNOSIS — I2510 Atherosclerotic heart disease of native coronary artery without angina pectoris: Principal | ICD-10-CM | POA: Diagnosis present

## 2014-05-07 DIAGNOSIS — M869 Osteomyelitis, unspecified: Secondary | ICD-10-CM | POA: Diagnosis present

## 2014-05-07 DIAGNOSIS — Z8614 Personal history of Methicillin resistant Staphylococcus aureus infection: Secondary | ICD-10-CM

## 2014-05-07 DIAGNOSIS — I428 Other cardiomyopathies: Secondary | ICD-10-CM | POA: Diagnosis present

## 2014-05-07 DIAGNOSIS — Z794 Long term (current) use of insulin: Secondary | ICD-10-CM

## 2014-05-07 DIAGNOSIS — J9819 Other pulmonary collapse: Secondary | ICD-10-CM | POA: Diagnosis not present

## 2014-05-07 DIAGNOSIS — I4949 Other premature depolarization: Secondary | ICD-10-CM | POA: Diagnosis not present

## 2014-05-07 HISTORY — PX: INTRAOPERATIVE TRANSESOPHAGEAL ECHOCARDIOGRAM: SHX5062

## 2014-05-07 HISTORY — PX: CORONARY ARTERY BYPASS GRAFT: SHX141

## 2014-05-07 LAB — POCT I-STAT, CHEM 8
BUN: 29 mg/dL — AB (ref 6–23)
BUN: 27 mg/dL — ABNORMAL HIGH (ref 6–23)
BUN: 27 mg/dL — ABNORMAL HIGH (ref 6–23)
BUN: 27 mg/dL — AB (ref 6–23)
BUN: 25 mg/dL — ABNORMAL HIGH (ref 6–23)
CALCIUM ION: 1.31 mmol/L — AB (ref 1.12–1.23)
CALCIUM ION: 1.13 mmol/L (ref 1.12–1.23)
CHLORIDE: 106 meq/L (ref 96–112)
CREATININE: 1.2 mg/dL (ref 0.50–1.35)
CREATININE: 1.2 mg/dL (ref 0.50–1.35)
Calcium, Ion: 1.15 mmol/L (ref 1.12–1.23)
Calcium, Ion: 1.13 mmol/L (ref 1.12–1.23)
Calcium, Ion: 1.27 mmol/L — ABNORMAL HIGH (ref 1.12–1.23)
Chloride: 107 mEq/L (ref 96–112)
Chloride: 102 mEq/L (ref 96–112)
Chloride: 102 mEq/L (ref 96–112)
Chloride: 103 mEq/L (ref 96–112)
Creatinine, Ser: 1.3 mg/dL (ref 0.50–1.35)
Creatinine, Ser: 1.3 mg/dL (ref 0.50–1.35)
Creatinine, Ser: 1.3 mg/dL (ref 0.50–1.35)
GLUCOSE: 122 mg/dL — AB (ref 70–99)
GLUCOSE: 142 mg/dL — AB (ref 70–99)
GLUCOSE: 200 mg/dL — AB (ref 70–99)
GLUCOSE: 117 mg/dL — AB (ref 70–99)
Glucose, Bld: 105 mg/dL — ABNORMAL HIGH (ref 70–99)
HCT: 31 % — ABNORMAL LOW (ref 39.0–52.0)
HCT: 25 % — ABNORMAL LOW (ref 39.0–52.0)
HCT: 33 % — ABNORMAL LOW (ref 39.0–52.0)
HEMATOCRIT: 22 % — AB (ref 39.0–52.0)
HEMATOCRIT: 28 % — AB (ref 39.0–52.0)
HEMOGLOBIN: 10.5 g/dL — AB (ref 13.0–17.0)
HEMOGLOBIN: 8.5 g/dL — AB (ref 13.0–17.0)
HEMOGLOBIN: 9.5 g/dL — AB (ref 13.0–17.0)
Hemoglobin: 7.5 g/dL — ABNORMAL LOW (ref 13.0–17.0)
Hemoglobin: 11.2 g/dL — ABNORMAL LOW (ref 13.0–17.0)
POTASSIUM: 3.9 meq/L (ref 3.7–5.3)
Potassium: 4.1 mEq/L (ref 3.7–5.3)
Potassium: 4.7 mEq/L (ref 3.7–5.3)
Potassium: 5.7 mEq/L — ABNORMAL HIGH (ref 3.7–5.3)
Potassium: 4.9 mEq/L (ref 3.7–5.3)
SODIUM: 137 meq/L (ref 137–147)
SODIUM: 142 meq/L (ref 137–147)
Sodium: 140 mEq/L (ref 137–147)
Sodium: 138 mEq/L (ref 137–147)
Sodium: 140 mEq/L (ref 137–147)
TCO2: 23 mmol/L (ref 0–100)
TCO2: 20 mmol/L (ref 0–100)
TCO2: 22 mmol/L (ref 0–100)
TCO2: 21 mmol/L (ref 0–100)
TCO2: 22 mmol/L (ref 0–100)

## 2014-05-07 LAB — POCT I-STAT 3, ART BLOOD GAS (G3+)
ACID-BASE DEFICIT: 2 mmol/L (ref 0.0–2.0)
ACID-BASE DEFICIT: 4 mmol/L — AB (ref 0.0–2.0)
Acid-base deficit: 2 mmol/L (ref 0.0–2.0)
Acid-base deficit: 3 mmol/L — ABNORMAL HIGH (ref 0.0–2.0)
BICARBONATE: 20.8 meq/L (ref 20.0–24.0)
Bicarbonate: 22.3 mEq/L (ref 20.0–24.0)
Bicarbonate: 22.8 mEq/L (ref 20.0–24.0)
Bicarbonate: 21.3 mEq/L (ref 20.0–24.0)
O2 SAT: 100 %
O2 SAT: 100 %
O2 SAT: 93 %
O2 Saturation: 98 %
PCO2 ART: 39 mmHg (ref 35.0–45.0)
PCO2 ART: 31.7 mmHg — AB (ref 35.0–45.0)
PO2 ART: 174 mmHg — AB (ref 80.0–100.0)
PO2 ART: 99 mmHg (ref 80.0–100.0)
Patient temperature: 33.3
Patient temperature: 37.2
Patient temperature: 36.2
TCO2: 23 mmol/L (ref 0–100)
TCO2: 24 mmol/L (ref 0–100)
TCO2: 22 mmol/L (ref 0–100)
TCO2: 22 mmol/L (ref 0–100)
pCO2 arterial: 30.5 mmHg — ABNORMAL LOW (ref 35.0–45.0)
pCO2 arterial: 37.1 mmHg (ref 35.0–45.0)
pH, Arterial: 7.457 — ABNORMAL HIGH (ref 7.350–7.450)
pH, Arterial: 7.375 (ref 7.350–7.450)
pH, Arterial: 7.423 (ref 7.350–7.450)
pH, Arterial: 7.364 (ref 7.350–7.450)
pO2, Arterial: 310 mmHg — ABNORMAL HIGH (ref 80.0–100.0)
pO2, Arterial: 62 mmHg — ABNORMAL LOW (ref 80.0–100.0)

## 2014-05-07 LAB — POCT I-STAT 4, (NA,K, GLUC, HGB,HCT)
GLUCOSE: 113 mg/dL — AB (ref 70–99)
GLUCOSE: 187 mg/dL — AB (ref 70–99)
Glucose, Bld: 116 mg/dL — ABNORMAL HIGH (ref 70–99)
Glucose, Bld: 168 mg/dL — ABNORMAL HIGH (ref 70–99)
HCT: 27 % — ABNORMAL LOW (ref 39.0–52.0)
HCT: 27 % — ABNORMAL LOW (ref 39.0–52.0)
HCT: 25 % — ABNORMAL LOW (ref 39.0–52.0)
HCT: 32 % — ABNORMAL LOW (ref 39.0–52.0)
HEMOGLOBIN: 9.2 g/dL — AB (ref 13.0–17.0)
Hemoglobin: 9.2 g/dL — ABNORMAL LOW (ref 13.0–17.0)
Hemoglobin: 8.5 g/dL — ABNORMAL LOW (ref 13.0–17.0)
Hemoglobin: 10.9 g/dL — ABNORMAL LOW (ref 13.0–17.0)
POTASSIUM: 4.1 meq/L (ref 3.7–5.3)
POTASSIUM: 4.2 meq/L (ref 3.7–5.3)
Potassium: 5.6 mEq/L — ABNORMAL HIGH (ref 3.7–5.3)
Potassium: 4.3 mEq/L (ref 3.7–5.3)
SODIUM: 141 meq/L (ref 137–147)
Sodium: 140 mEq/L (ref 137–147)
Sodium: 138 mEq/L (ref 137–147)
Sodium: 138 mEq/L (ref 137–147)

## 2014-05-07 LAB — POCT I-STAT 3, VENOUS BLOOD GAS (G3P V)
ACID-BASE DEFICIT: 4 mmol/L — AB (ref 0.0–2.0)
Bicarbonate: 21.5 mEq/L (ref 20.0–24.0)
O2 SAT: 75 %
PO2 VEN: 32 mmHg (ref 30.0–45.0)
TCO2: 23 mmol/L (ref 0–100)
pCO2, Ven: 32.6 mmHg — ABNORMAL LOW (ref 45.0–50.0)
pH, Ven: 7.41 — ABNORMAL HIGH (ref 7.250–7.300)

## 2014-05-07 LAB — CBC
HCT: 30.1 % — ABNORMAL LOW (ref 39.0–52.0)
HCT: 27 % — ABNORMAL LOW (ref 39.0–52.0)
Hemoglobin: 10.1 g/dL — ABNORMAL LOW (ref 13.0–17.0)
Hemoglobin: 9 g/dL — ABNORMAL LOW (ref 13.0–17.0)
MCH: 29.1 pg (ref 26.0–34.0)
MCH: 28.6 pg (ref 26.0–34.0)
MCHC: 33.6 g/dL (ref 30.0–36.0)
MCHC: 33.3 g/dL (ref 30.0–36.0)
MCV: 86.7 fL (ref 78.0–100.0)
MCV: 85.7 fL (ref 78.0–100.0)
Platelets: 206 10*3/uL (ref 150–400)
Platelets: 172 10*3/uL (ref 150–400)
RBC: 3.47 MIL/uL — AB (ref 4.22–5.81)
RBC: 3.15 MIL/uL — ABNORMAL LOW (ref 4.22–5.81)
RDW: 12.9 % (ref 11.5–15.5)
RDW: 12.8 % (ref 11.5–15.5)
WBC: 17.7 10*3/uL — ABNORMAL HIGH (ref 4.0–10.5)
WBC: 12.6 10*3/uL — ABNORMAL HIGH (ref 4.0–10.5)

## 2014-05-07 LAB — POCT I-STAT GLUCOSE
GLUCOSE: 125 mg/dL — AB (ref 70–99)
OPERATOR ID: 3390

## 2014-05-07 LAB — PROTIME-INR
INR: 1.36 (ref 0.00–1.49)
PROTHROMBIN TIME: 16.4 s — AB (ref 11.6–15.2)

## 2014-05-07 LAB — GLUCOSE, CAPILLARY
GLUCOSE-CAPILLARY: 145 mg/dL — AB (ref 70–99)
GLUCOSE-CAPILLARY: 127 mg/dL — AB (ref 70–99)
GLUCOSE-CAPILLARY: 82 mg/dL (ref 70–99)
Glucose-Capillary: 133 mg/dL — ABNORMAL HIGH (ref 70–99)
Glucose-Capillary: 162 mg/dL — ABNORMAL HIGH (ref 70–99)
Glucose-Capillary: 91 mg/dL (ref 70–99)

## 2014-05-07 LAB — MAGNESIUM: Magnesium: 2.9 mg/dL — ABNORMAL HIGH (ref 1.5–2.5)

## 2014-05-07 LAB — HEMOGLOBIN AND HEMATOCRIT, BLOOD
HCT: 20.2 % — ABNORMAL LOW (ref 39.0–52.0)
Hemoglobin: 6.9 g/dL — CL (ref 13.0–17.0)

## 2014-05-07 LAB — CREATININE, SERUM
Creatinine, Ser: 1.22 mg/dL (ref 0.50–1.35)
GFR calc Af Amer: 78 mL/min — ABNORMAL LOW (ref >90)
GFR, EST NON AFRICAN AMERICAN: 67 mL/min — AB (ref >90)

## 2014-05-07 LAB — PLATELET COUNT: PLATELETS: 129 10*3/uL — AB (ref 150–400)

## 2014-05-07 LAB — APTT: aPTT: 33 seconds (ref 24–37)

## 2014-05-07 SURGERY — CORONARY ARTERY BYPASS GRAFTING (CABG)
Anesthesia: General | Site: Chest

## 2014-05-07 MED ORDER — ARTIFICIAL TEARS OP OINT
TOPICAL_OINTMENT | OPHTHALMIC | Status: DC | PRN
  Administered 2014-05-07: 1 via OPHTHALMIC

## 2014-05-07 MED ORDER — SODIUM BICARBONATE 8.4 % IV SOLN
INTRAVENOUS | Status: AC
  Filled 2014-05-07: qty 50

## 2014-05-07 MED ORDER — VECURONIUM BROMIDE 10 MG IV SOLR
INTRAVENOUS | Status: AC
  Filled 2014-05-07: qty 10

## 2014-05-07 MED ORDER — ASPIRIN 81 MG PO CHEW: 324.0000 mg | CHEWABLE_TABLET | Freq: Every day | ORAL | Status: DC

## 2014-05-07 MED ORDER — LACTATED RINGERS IV SOLN
INTRAVENOUS | Status: DC | PRN
  Administered 2014-05-07: 07:00:00 via INTRAVENOUS

## 2014-05-07 MED ORDER — FENTANYL CITRATE 0.05 MG/ML IJ SOLN
INTRAMUSCULAR | Status: AC
  Filled 2014-05-07: qty 5

## 2014-05-07 MED ORDER — MORPHINE SULFATE 2 MG/ML IJ SOLN
2.0000 mg | INTRAMUSCULAR | Status: DC | PRN
  Administered 2014-05-07 – 2014-05-08 (×5): 2 mg via INTRAVENOUS
  Filled 2014-05-07 (×5): qty 1

## 2014-05-07 MED ORDER — ALBUMIN HUMAN 5 % IV SOLN
250.0000 mL | INTRAVENOUS | Status: AC | PRN
  Administered 2014-05-07 – 2014-05-08 (×3): 250 mL via INTRAVENOUS
  Filled 2014-05-07: qty 250

## 2014-05-07 MED ORDER — PROPOFOL 10 MG/ML IV BOLUS
INTRAVENOUS | Status: DC | PRN
  Administered 2014-05-07: 60 mg via INTRAVENOUS

## 2014-05-07 MED ORDER — SUCCINYLCHOLINE CHLORIDE 20 MG/ML IJ SOLN
INTRAMUSCULAR | Status: AC
  Filled 2014-05-07: qty 1

## 2014-05-07 MED ORDER — EPHEDRINE SULFATE 50 MG/ML IJ SOLN
INTRAMUSCULAR | Status: AC
  Filled 2014-05-07: qty 1

## 2014-05-07 MED ORDER — SODIUM CHLORIDE 0.9 % IV SOLN: 250.0000 mL | INTRAVENOUS | Status: DC

## 2014-05-07 MED ORDER — METOPROLOL TARTRATE 1 MG/ML IV SOLN: 2.5000 mg | INTRAVENOUS | Status: DC | PRN

## 2014-05-07 MED ORDER — SODIUM CHLORIDE 0.45 % IV SOLN
INTRAVENOUS | Status: DC
  Administered 2014-05-07: 14:00:00 via INTRAVENOUS

## 2014-05-07 MED ORDER — THROMBIN 20000 UNITS EX SOLR
CUTANEOUS | Status: AC
  Filled 2014-05-07: qty 20000

## 2014-05-07 MED ORDER — HEPARIN SODIUM (PORCINE) 1000 UNIT/ML IJ SOLN
INTRAMUSCULAR | Status: AC
  Filled 2014-05-07: qty 1

## 2014-05-07 MED ORDER — PHENYLEPHRINE HCL 10 MG/ML IJ SOLN
INTRAMUSCULAR | Status: DC | PRN
  Administered 2014-05-07 (×2): 40 ug via INTRAVENOUS

## 2014-05-07 MED ORDER — BIOTENE DRY MOUTH MT LIQD: 15.0000 mL | Freq: Two times a day (BID) | OROMUCOSAL | Status: DC

## 2014-05-07 MED ORDER — DEXMEDETOMIDINE HCL IN NACL 200 MCG/50ML IV SOLN
0.1000 ug/kg/h | INTRAVENOUS | Status: DC
  Administered 2014-05-07: 0.5 ug/kg/h via INTRAVENOUS
  Filled 2014-05-07: qty 50

## 2014-05-07 MED ORDER — GABAPENTIN 100 MG PO CAPS
100.0000 mg | ORAL_CAPSULE | Freq: Two times a day (BID) | ORAL | Status: DC
  Administered 2014-05-08 – 2014-05-15 (×14): 100 mg via ORAL
  Filled 2014-05-07 (×17): qty 1

## 2014-05-07 MED ORDER — NITROGLYCERIN IN D5W 200-5 MCG/ML-% IV SOLN
0.0000 ug/min | INTRAVENOUS | Status: DC
Start: 2014-05-07 — End: 2014-05-08

## 2014-05-07 MED ORDER — INSULIN REGULAR HUMAN 100 UNIT/ML IJ SOLN
INTRAMUSCULAR | Status: DC
  Administered 2014-05-07: 19:00:00 via INTRAVENOUS
  Filled 2014-05-07 (×2): qty 1

## 2014-05-07 MED ORDER — OXYCODONE HCL 5 MG PO TABS
5.0000 mg | ORAL_TABLET | ORAL | Status: DC | PRN
  Administered 2014-05-08: 5 mg via ORAL
  Administered 2014-05-08: 10 mg via ORAL
  Administered 2014-05-09: 5 mg via ORAL
  Administered 2014-05-09 – 2014-05-11 (×3): 10 mg via ORAL
  Filled 2014-05-07 (×3): qty 2
  Filled 2014-05-07 (×2): qty 1
  Filled 2014-05-07: qty 2

## 2014-05-07 MED ORDER — VECURONIUM BROMIDE 10 MG IV SOLR
INTRAVENOUS | Status: DC | PRN
  Administered 2014-05-07 (×2): 10 mg via INTRAVENOUS

## 2014-05-07 MED ORDER — SODIUM CHLORIDE 0.9 % IJ SOLN
3.0000 mL | Freq: Two times a day (BID) | INTRAMUSCULAR | Status: DC
  Administered 2014-05-08 – 2014-05-11 (×5): 3 mL via INTRAVENOUS

## 2014-05-07 MED ORDER — PANTOPRAZOLE SODIUM 40 MG PO TBEC
40.0000 mg | DELAYED_RELEASE_TABLET | Freq: Every day | ORAL | Status: DC
  Administered 2014-05-09 – 2014-05-11 (×3): 40 mg via ORAL
  Filled 2014-05-07 (×3): qty 1

## 2014-05-07 MED ORDER — ACETAMINOPHEN 160 MG/5ML PO SOLN: 650.0000 mg | Freq: Once | ORAL | Status: AC

## 2014-05-07 MED ORDER — BISACODYL 5 MG PO TBEC
10.0000 mg | DELAYED_RELEASE_TABLET | Freq: Every day | ORAL | Status: DC
  Administered 2014-05-08 – 2014-05-11 (×4): 10 mg via ORAL
  Filled 2014-05-07 (×4): qty 2

## 2014-05-07 MED ORDER — ONDANSETRON HCL 4 MG/2ML IJ SOLN
4.0000 mg | Freq: Four times a day (QID) | INTRAMUSCULAR | Status: DC | PRN
  Administered 2014-05-09: 4 mg via INTRAVENOUS
  Filled 2014-05-07: qty 2

## 2014-05-07 MED ORDER — SODIUM CHLORIDE 0.9 % IR SOLN
Status: DC | PRN
  Administered 2014-05-07: 1

## 2014-05-07 MED ORDER — SODIUM CHLORIDE 0.9 % IV SOLN: INTRAVENOUS | Status: DC

## 2014-05-07 MED ORDER — CARVEDILOL 12.5 MG PO TABS
ORAL_TABLET | ORAL | Status: AC
  Filled 2014-05-07: qty 2

## 2014-05-07 MED ORDER — CARVEDILOL 25 MG PO TABS
25.0000 mg | ORAL_TABLET | Freq: Once | ORAL | Status: AC
  Administered 2014-05-07: 25 mg via ORAL

## 2014-05-07 MED ORDER — LACTATED RINGERS IV SOLN
INTRAVENOUS | Status: DC
  Administered 2014-05-07: 20 mL/h via INTRAVENOUS

## 2014-05-07 MED ORDER — CHLORHEXIDINE GLUCONATE 4 % EX LIQD: 30.0000 mL | CUTANEOUS | Status: DC

## 2014-05-07 MED ORDER — ROCURONIUM BROMIDE 100 MG/10ML IV SOLN
INTRAVENOUS | Status: DC | PRN
  Administered 2014-05-07: 50 mg via INTRAVENOUS

## 2014-05-07 MED ORDER — PHENYLEPHRINE HCL 10 MG/ML IJ SOLN
0.0000 ug/min | INTRAMUSCULAR | Status: DC
  Administered 2014-05-07: 40 ug/min via INTRAVENOUS
  Administered 2014-05-08: 15 ug/min via INTRAVENOUS
  Filled 2014-05-07 (×3): qty 2

## 2014-05-07 MED ORDER — ROCURONIUM BROMIDE 50 MG/5ML IV SOLN
INTRAVENOUS | Status: AC
  Filled 2014-05-07: qty 1

## 2014-05-07 MED ORDER — ALBUMIN HUMAN 5 % IV SOLN
INTRAVENOUS | Status: DC | PRN
  Administered 2014-05-07: 12:00:00 via INTRAVENOUS

## 2014-05-07 MED ORDER — VECURONIUM BROMIDE 10 MG IV SOLR
INTRAVENOUS | Status: AC
  Filled 2014-05-07: qty 20

## 2014-05-07 MED ORDER — INSULIN REGULAR BOLUS VIA INFUSION
0.0000 [IU] | Freq: Three times a day (TID) | INTRAVENOUS | Status: DC
  Filled 2014-05-07: qty 10

## 2014-05-07 MED ORDER — PROTAMINE SULFATE 10 MG/ML IV SOLN
INTRAVENOUS | Status: AC
  Filled 2014-05-07: qty 5

## 2014-05-07 MED ORDER — PROTAMINE SULFATE 10 MG/ML IV SOLN
INTRAVENOUS | Status: AC
  Filled 2014-05-07: qty 25

## 2014-05-07 MED ORDER — HEPARIN SODIUM (PORCINE) 1000 UNIT/ML IJ SOLN
INTRAMUSCULAR | Status: DC | PRN
  Administered 2014-05-07: 34000 [IU] via INTRAVENOUS

## 2014-05-07 MED ORDER — ACETAMINOPHEN 160 MG/5ML PO SOLN
1000.0000 mg | Freq: Four times a day (QID) | ORAL | Status: DC
  Filled 2014-05-07: qty 40

## 2014-05-07 MED ORDER — MORPHINE SULFATE 2 MG/ML IJ SOLN: 1.0000 mg | INTRAMUSCULAR | Status: AC | PRN

## 2014-05-07 MED ORDER — FENTANYL CITRATE 0.05 MG/ML IJ SOLN
INTRAMUSCULAR | Status: DC | PRN
  Administered 2014-05-07: 250 ug via INTRAVENOUS
  Administered 2014-05-07: 100 ug via INTRAVENOUS
  Administered 2014-05-07: 50 ug via INTRAVENOUS
  Administered 2014-05-07: 100 ug via INTRAVENOUS
  Administered 2014-05-07 (×2): 150 ug via INTRAVENOUS
  Administered 2014-05-07: 250 ug via INTRAVENOUS
  Administered 2014-05-07: 150 ug via INTRAVENOUS
  Administered 2014-05-07 (×2): 100 ug via INTRAVENOUS
  Administered 2014-05-07: 250 ug via INTRAVENOUS
  Administered 2014-05-07 (×2): 50 ug via INTRAVENOUS

## 2014-05-07 MED ORDER — MIDAZOLAM HCL 2 MG/2ML IJ SOLN: 2.0000 mg | INTRAMUSCULAR | Status: DC | PRN

## 2014-05-07 MED ORDER — POTASSIUM CHLORIDE 10 MEQ/50ML IV SOLN: 10.0000 meq | INTRAVENOUS | Status: AC

## 2014-05-07 MED ORDER — PROTAMINE SULFATE 10 MG/ML IV SOLN
INTRAVENOUS | Status: DC | PRN
  Administered 2014-05-07 (×4): 50 mg via INTRAVENOUS
  Administered 2014-05-07: 20 mg via INTRAVENOUS
  Administered 2014-05-07: 60 mg via INTRAVENOUS
  Administered 2014-05-07: 50 mg via INTRAVENOUS

## 2014-05-07 MED ORDER — SODIUM CHLORIDE 0.9 % IJ SOLN: 3.0000 mL | INTRAMUSCULAR | Status: DC | PRN

## 2014-05-07 MED ORDER — SODIUM CHLORIDE 0.9 % IJ SOLN
INTRAMUSCULAR | Status: AC
  Filled 2014-05-07: qty 10

## 2014-05-07 MED ORDER — MIDAZOLAM HCL 5 MG/5ML IJ SOLN
INTRAMUSCULAR | Status: DC | PRN
  Administered 2014-05-07: 1 mg via INTRAVENOUS
  Administered 2014-05-07: 3 mg via INTRAVENOUS
  Administered 2014-05-07 (×2): 2 mg via INTRAVENOUS
  Administered 2014-05-07: 1 mg via INTRAVENOUS
  Administered 2014-05-07 (×2): 2 mg via INTRAVENOUS
  Administered 2014-05-07 (×2): 3 mg via INTRAVENOUS
  Administered 2014-05-07: 1 mg via INTRAVENOUS

## 2014-05-07 MED ORDER — ASPIRIN EC 325 MG PO TBEC
325.0000 mg | DELAYED_RELEASE_TABLET | Freq: Every day | ORAL | Status: DC
  Administered 2014-05-08 – 2014-05-11 (×4): 325 mg via ORAL
  Filled 2014-05-07 (×5): qty 1

## 2014-05-07 MED ORDER — PHENYLEPHRINE 40 MCG/ML (10ML) SYRINGE FOR IV PUSH (FOR BLOOD PRESSURE SUPPORT)
PREFILLED_SYRINGE | INTRAVENOUS | Status: AC
  Filled 2014-05-07: qty 10

## 2014-05-07 MED ORDER — FAMOTIDINE IN NACL 20-0.9 MG/50ML-% IV SOLN
20.0000 mg | Freq: Two times a day (BID) | INTRAVENOUS | Status: AC
  Administered 2014-05-07: 20 mg via INTRAVENOUS

## 2014-05-07 MED ORDER — MIDAZOLAM HCL 10 MG/2ML IJ SOLN
INTRAMUSCULAR | Status: AC
  Filled 2014-05-07: qty 2

## 2014-05-07 MED ORDER — DOCUSATE SODIUM 100 MG PO CAPS
200.0000 mg | ORAL_CAPSULE | Freq: Every day | ORAL | Status: DC
  Administered 2014-05-08 – 2014-05-11 (×4): 200 mg via ORAL
  Filled 2014-05-07 (×5): qty 2

## 2014-05-07 MED ORDER — DEXMEDETOMIDINE HCL IN NACL 200 MCG/50ML IV SOLN
0.4000 ug/kg/h | INTRAVENOUS | Status: DC
  Filled 2014-05-07: qty 50

## 2014-05-07 MED ORDER — LIDOCAINE HCL (CARDIAC) 20 MG/ML IV SOLN
INTRAVENOUS | Status: AC
  Filled 2014-05-07: qty 5

## 2014-05-07 MED ORDER — METOPROLOL TARTRATE 25 MG/10 ML ORAL SUSPENSION
12.5000 mg | Freq: Two times a day (BID) | ORAL | Status: DC
  Filled 2014-05-07 (×3): qty 5

## 2014-05-07 MED ORDER — PROPOFOL 10 MG/ML IV BOLUS
INTRAVENOUS | Status: AC
  Filled 2014-05-07: qty 20

## 2014-05-07 MED ORDER — MAGNESIUM SULFATE 4000MG/100ML IJ SOLN
4.0000 g | Freq: Once | INTRAMUSCULAR | Status: AC
  Administered 2014-05-07: 4 g via INTRAVENOUS
  Filled 2014-05-07: qty 100

## 2014-05-07 MED ORDER — SODIUM CHLORIDE 0.9 % IV SOLN
INTRAVENOUS | Status: DC | PRN
  Administered 2014-05-07: 13:00:00 via INTRAVENOUS

## 2014-05-07 MED ORDER — VANCOMYCIN HCL IN DEXTROSE 1-5 GM/200ML-% IV SOLN
1000.0000 mg | Freq: Once | INTRAVENOUS | Status: AC
  Administered 2014-05-07: 1000 mg via INTRAVENOUS
  Filled 2014-05-07: qty 200

## 2014-05-07 MED ORDER — CEFUROXIME SODIUM 1.5 G IJ SOLR
1.5000 g | Freq: Two times a day (BID) | INTRAMUSCULAR | Status: AC
  Administered 2014-05-07 – 2014-05-09 (×4): 1.5 g via INTRAVENOUS
  Filled 2014-05-07 (×4): qty 1.5

## 2014-05-07 MED ORDER — THROMBIN 20000 UNITS EX SOLR
OROMUCOSAL | Status: DC | PRN
  Administered 2014-05-07: 10:00:00 via TOPICAL

## 2014-05-07 MED ORDER — DOPAMINE-DEXTROSE 3.2-5 MG/ML-% IV SOLN
3.0000 ug/kg/min | INTRAVENOUS | Status: DC
  Administered 2014-05-09: 3 ug/kg/min via INTRAVENOUS
  Filled 2014-05-07: qty 250

## 2014-05-07 MED ORDER — SODIUM CHLORIDE 0.9 % IV SOLN
1.0000 g/h | INTRAVENOUS | Status: DC
  Filled 2014-05-07: qty 20

## 2014-05-07 MED ORDER — CHLORHEXIDINE GLUCONATE 0.12 % MT SOLN
15.0000 mL | Freq: Two times a day (BID) | OROMUCOSAL | Status: DC
  Administered 2014-05-07: 15 mL via OROMUCOSAL
  Filled 2014-05-07: qty 15

## 2014-05-07 MED ORDER — LACTATED RINGERS IV SOLN
INTRAVENOUS | Status: DC | PRN
  Administered 2014-05-07 (×2): via INTRAVENOUS

## 2014-05-07 MED ORDER — ARTIFICIAL TEARS OP OINT
TOPICAL_OINTMENT | OPHTHALMIC | Status: AC
  Filled 2014-05-07: qty 3.5

## 2014-05-07 MED ORDER — ACETAMINOPHEN 650 MG RE SUPP
650.0000 mg | Freq: Once | RECTAL | Status: AC
  Administered 2014-05-07: 650 mg via RECTAL

## 2014-05-07 MED ORDER — METOPROLOL TARTRATE 12.5 MG HALF TABLET
12.5000 mg | ORAL_TABLET | Freq: Two times a day (BID) | ORAL | Status: DC
  Filled 2014-05-07 (×3): qty 1

## 2014-05-07 MED ORDER — ACETAMINOPHEN 500 MG PO TABS
1000.0000 mg | ORAL_TABLET | Freq: Four times a day (QID) | ORAL | Status: DC
  Administered 2014-05-08 – 2014-05-11 (×15): 1000 mg via ORAL
  Filled 2014-05-07 (×20): qty 2

## 2014-05-07 MED ORDER — METOPROLOL TARTRATE 12.5 MG HALF TABLET: 12.5000 mg | ORAL_TABLET | Freq: Once | ORAL | Status: DC

## 2014-05-07 MED ORDER — HEMOSTATIC AGENTS (NO CHARGE) OPTIME
TOPICAL | Status: DC | PRN
  Administered 2014-05-07: 1 via TOPICAL

## 2014-05-07 MED ORDER — BISACODYL 10 MG RE SUPP: 10.0000 mg | Freq: Every day | RECTAL | Status: DC

## 2014-05-07 MED ORDER — LACTATED RINGERS IV SOLN: 500.0000 mL | Freq: Once | INTRAVENOUS | Status: AC | PRN

## 2014-05-07 MED ORDER — LIDOCAINE HCL (CARDIAC) 20 MG/ML IV SOLN
INTRAVENOUS | Status: DC | PRN
  Administered 2014-05-07: 100 mg via INTRAVENOUS

## 2014-05-07 SURGICAL SUPPLY — 107 items
ATTRACTOMAT 16X20 MAGNETIC DRP (DRAPES) ×4 IMPLANT
BAG DECANTER FOR FLEXI CONT (MISCELLANEOUS) ×4 IMPLANT
BANDAGE ELASTIC 4 VELCRO ST LF (GAUZE/BANDAGES/DRESSINGS) ×4 IMPLANT
BANDAGE ELASTIC 6 VELCRO ST LF (GAUZE/BANDAGES/DRESSINGS) ×4 IMPLANT
BANDAGE GAUZE ELAST BULKY 4 IN (GAUZE/BANDAGES/DRESSINGS) ×4 IMPLANT
BASKET HEART  (ORDER IN 25'S) (MISCELLANEOUS) ×1
BASKET HEART (ORDER IN 25'S) (MISCELLANEOUS) ×1
BASKET HEART (ORDER IN 25S) (MISCELLANEOUS) ×2 IMPLANT
BLADE STERNUM SYSTEM 6 (BLADE) ×4 IMPLANT
BLADE SURG 11 STRL SS (BLADE) ×4 IMPLANT
CANISTER SUCTION 2500CC (MISCELLANEOUS) ×4 IMPLANT
CARDIAC SUCTION (MISCELLANEOUS) ×4 IMPLANT
CATH ROBINSON RED A/P 18FR (CATHETERS) ×8 IMPLANT
CATH THORACIC 28FR (CATHETERS) ×4 IMPLANT
CATH THORACIC 36FR (CATHETERS) ×4 IMPLANT
CATH THORACIC 36FR RT ANG (CATHETERS) ×4 IMPLANT
CLIP TI MEDIUM 24 (CLIP) IMPLANT
CLIP TI WIDE RED SMALL 24 (CLIP) ×4 IMPLANT
COVER SURGICAL LIGHT HANDLE (MISCELLANEOUS) ×4 IMPLANT
CRADLE DONUT ADULT HEAD (MISCELLANEOUS) ×4 IMPLANT
DERMABOND ADVANCED (GAUZE/BANDAGES/DRESSINGS) ×2
DERMABOND ADVANCED .7 DNX12 (GAUZE/BANDAGES/DRESSINGS) ×2 IMPLANT
DRAPE CARDIOVASCULAR INCISE (DRAPES) ×2
DRAPE SLUSH/WARMER DISC (DRAPES) ×4 IMPLANT
DRAPE SRG 135X102X78XABS (DRAPES) ×2 IMPLANT
DRSG COVADERM 4X14 (GAUZE/BANDAGES/DRESSINGS) ×4 IMPLANT
ELECT CAUTERY BLADE 6.4 (BLADE) ×4 IMPLANT
ELECT REM PT RETURN 9FT ADLT (ELECTROSURGICAL) ×8
ELECTRODE REM PT RTRN 9FT ADLT (ELECTROSURGICAL) ×4 IMPLANT
GLOVE BIO SURGEON STRL SZ 6 (GLOVE) IMPLANT
GLOVE BIO SURGEON STRL SZ 6.5 (GLOVE) IMPLANT
GLOVE BIO SURGEON STRL SZ7 (GLOVE) IMPLANT
GLOVE BIO SURGEON STRL SZ7.5 (GLOVE) IMPLANT
GLOVE BIO SURGEONS STRL SZ 6.5 (GLOVE)
GLOVE BIOGEL M 6.5 STRL (GLOVE) ×8 IMPLANT
GLOVE BIOGEL M STER SZ 6 (GLOVE) ×24 IMPLANT
GLOVE BIOGEL M STRL SZ7.5 (GLOVE) ×8 IMPLANT
GLOVE BIOGEL PI IND STRL 6 (GLOVE) IMPLANT
GLOVE BIOGEL PI IND STRL 6.5 (GLOVE) IMPLANT
GLOVE BIOGEL PI IND STRL 7.0 (GLOVE) ×6 IMPLANT
GLOVE BIOGEL PI INDICATOR 6 (GLOVE)
GLOVE BIOGEL PI INDICATOR 6.5 (GLOVE)
GLOVE BIOGEL PI INDICATOR 7.0 (GLOVE) ×6
GLOVE EUDERMIC 7 POWDERFREE (GLOVE) ×8 IMPLANT
GLOVE ORTHO TXT STRL SZ7.5 (GLOVE) IMPLANT
GOWN STRL REUS W/ TWL LRG LVL3 (GOWN DISPOSABLE) ×12 IMPLANT
GOWN STRL REUS W/ TWL XL LVL3 (GOWN DISPOSABLE) ×2 IMPLANT
GOWN STRL REUS W/TWL LRG LVL3 (GOWN DISPOSABLE) ×12
GOWN STRL REUS W/TWL XL LVL3 (GOWN DISPOSABLE) ×2
HEMOSTAT POWDER SURGIFOAM 1G (HEMOSTASIS) ×12 IMPLANT
HEMOSTAT SURGICEL 2X14 (HEMOSTASIS) ×4 IMPLANT
INSERT FOGARTY 61MM (MISCELLANEOUS) IMPLANT
INSERT FOGARTY XLG (MISCELLANEOUS) IMPLANT
KIT BASIN OR (CUSTOM PROCEDURE TRAY) ×4 IMPLANT
KIT CATH CPB BARTLE (MISCELLANEOUS) ×4 IMPLANT
KIT ROOM TURNOVER OR (KITS) ×4 IMPLANT
KIT SUCTION CATH 14FR (SUCTIONS) ×4 IMPLANT
KIT VASOVIEW W/TROCAR VH 2000 (KITS) ×4 IMPLANT
NS IRRIG 1000ML POUR BTL (IV SOLUTION) ×20 IMPLANT
PACK OPEN HEART (CUSTOM PROCEDURE TRAY) ×4 IMPLANT
PAD ARMBOARD 7.5X6 YLW CONV (MISCELLANEOUS) ×8 IMPLANT
PAD ELECT DEFIB RADIOL ZOLL (MISCELLANEOUS) ×4 IMPLANT
PENCIL BUTTON HOLSTER BLD 10FT (ELECTRODE) ×4 IMPLANT
PUNCH AORTIC ROTATE 4.0MM (MISCELLANEOUS) IMPLANT
PUNCH AORTIC ROTATE 4.5MM 8IN (MISCELLANEOUS) ×4 IMPLANT
PUNCH AORTIC ROTATE 5MM 8IN (MISCELLANEOUS) IMPLANT
SET CARDIOPLEGIA MPS 5001102 (MISCELLANEOUS) ×4 IMPLANT
SPONGE GAUZE 4X4 12PLY (GAUZE/BANDAGES/DRESSINGS) ×8 IMPLANT
SPONGE GAUZE 4X4 12PLY STER LF (GAUZE/BANDAGES/DRESSINGS) ×8 IMPLANT
SPONGE INTESTINAL PEANUT (DISPOSABLE) IMPLANT
SPONGE LAP 18X18 X RAY DECT (DISPOSABLE) ×8 IMPLANT
SPONGE LAP 4X18 X RAY DECT (DISPOSABLE) ×4 IMPLANT
SUT BONE WAX W31G (SUTURE) ×4 IMPLANT
SUT MNCRL AB 4-0 PS2 18 (SUTURE) ×4 IMPLANT
SUT PROLENE 3 0 SH DA (SUTURE) IMPLANT
SUT PROLENE 3 0 SH1 36 (SUTURE) ×4 IMPLANT
SUT PROLENE 4 0 RB 1 (SUTURE)
SUT PROLENE 4 0 SH DA (SUTURE) IMPLANT
SUT PROLENE 4-0 RB1 .5 CRCL 36 (SUTURE) IMPLANT
SUT PROLENE 5 0 C 1 36 (SUTURE) IMPLANT
SUT PROLENE 6 0 C 1 24 (SUTURE) ×12 IMPLANT
SUT PROLENE 6 0 C 1 30 (SUTURE) IMPLANT
SUT PROLENE 7 0 BV 1 (SUTURE) IMPLANT
SUT PROLENE 7 0 BV1 MDA (SUTURE) ×8 IMPLANT
SUT PROLENE 8 0 BV175 6 (SUTURE) IMPLANT
SUT SILK  1 MH (SUTURE)
SUT SILK 1 MH (SUTURE) IMPLANT
SUT STEEL STERNAL CCS#1 18IN (SUTURE) IMPLANT
SUT STEEL SZ 6 DBL 3X14 BALL (SUTURE) IMPLANT
SUT VIC AB 1 CTX 36 (SUTURE) ×4
SUT VIC AB 1 CTX36XBRD ANBCTR (SUTURE) ×4 IMPLANT
SUT VIC AB 2-0 CT1 27 (SUTURE) ×4
SUT VIC AB 2-0 CT1 TAPERPNT 27 (SUTURE) ×4 IMPLANT
SUT VIC AB 2-0 CTX 27 (SUTURE) ×4 IMPLANT
SUT VIC AB 3-0 SH 27 (SUTURE)
SUT VIC AB 3-0 SH 27X BRD (SUTURE) IMPLANT
SUT VIC AB 3-0 X1 27 (SUTURE) IMPLANT
SUT VICRYL 4-0 PS2 18IN ABS (SUTURE) IMPLANT
SUTURE E-PAK OPEN HEART (SUTURE) ×4 IMPLANT
SYSTEM SAHARA CHEST DRAIN ATS (WOUND CARE) ×4 IMPLANT
TAPE CLOTH SURG 4X10 WHT LF (GAUZE/BANDAGES/DRESSINGS) ×8 IMPLANT
TOWEL OR 17X24 6PK STRL BLUE (TOWEL DISPOSABLE) ×4 IMPLANT
TOWEL OR 17X26 10 PK STRL BLUE (TOWEL DISPOSABLE) ×4 IMPLANT
TRAY FOLEY IC TEMP SENS 16FR (CATHETERS) ×4 IMPLANT
TUBING INSUFFLATION 10FT LAP (TUBING) ×4 IMPLANT
UNDERPAD 30X30 INCONTINENT (UNDERPADS AND DIAPERS) ×4 IMPLANT
WATER STERILE IRR 1000ML POUR (IV SOLUTION) ×8 IMPLANT

## 2014-05-07 NOTE — Progress Notes (Signed)
CT surgery p.m. Rounds  53 year old severe diabetic status post CABG x4 Currently sedated on ventilator with stable hemodynamics Creatinine 1.2, urine output adequate Blood sugars adequately controlled

## 2014-05-07 NOTE — Transfer of Care (Signed)
Immediate Anesthesia Transfer of Care Note  Patient: [name] [name]  Procedure(s) Performed: Procedure(s) with comments: CORONARY ARTERY BYPASS GRAFTING (CABG) (N/A) - Times 4 using left internal mammary artery and endoscopically harvested right saphenous vein INTRAOPERATIVE TRANSESOPHAGEAL ECHOCARDIOGRAM (N/A)  Patient Location: SICU  Anesthesia Type:General  Level of Consciousness: sedated and Patient remains intubated per anesthesia plan  Airway & Oxygen Therapy: Patient remains intubated per anesthesia plan and Patient placed on Ventilator (see vital sign flow sheet for setting)  Post-op Assessment: Report given to PACU RN and Post -op Vital signs reviewed and stable  Post vital signs: Reviewed and stable  Complications: No apparent anesthesia complications

## 2014-05-07 NOTE — H&P (Signed)
[address]       [place] [address]             [phone]      Cardiothoracic Surgery History and Physical    PCP is Tracie Harrier, MD  Referring Provider is Thresa Ross, MD  Chief Complaint   Patient presents with   .  Coronary Artery Disease     Surgical eval, Cardiac Cath 04/03/14 @ [facility]    HPI:  The patient is a 53 year old gentleman with a long history of diabetes and hypertension and a family history of premature coronary artery disease who underwent surgery on his left foot in September 2014 for a non-healing wound. 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 Postop he reports having shortness of breath and pulmonary edema leading to a cardiac workup at [facility] with an echo showing a low ejection fraction of 15%. His wife says that he developed renal failure with a creatinine that rose to over 5 and it was felt that he had nephrotoxicity due to an antibiotic. He had a kidney biopsy and was treated with steroids with gradual improvement. He was in the hospital for several weeks and discharged with an external defibrillator. He was treated medically and was found to have an improvement in his EF. The external defibrillator was removed. He had a stress cardiolyte in March 2015 showing a fixed inferior and reversible lateral defect with an EF of 40%. He reports intermittent atypical chest discomfort that he describes as a pain on the outside of his chest with sensitivity of the skin to touch. He denies any chest pressure or heaviness. He has had some exertional dyspnea and has been very fatigued. He has had dizziness and some falls that he thinks is due to the medication he is on.  Past Medical History   Diagnosis  Date   .  CAD (coronary artery disease)    .  Cardiomyopathy    .  Chest pain    .  Congestive heart failure    .  Diabetes    .  Dyspnea    .  Hypertension    .  Peripheral vascular disease     Past Surgical History   Procedure   Laterality  Date   .  Left foot osteomyelitis and wound after surgery     .  Tonsilectomy/adenoidectomy with myringotomy     .  Cataract extraction      Family History   Problem  Relation  Age of Onset   .  Heart disease  Father      CABG in his 72's   .  Diabetes type II     .  Kidney disease     .  Hypertension     Social History  History   Substance Use Topics   .  Smoking status:  Never Smoker   .  Smokeless tobacco:  Never Used   .  Alcohol Use:  No    Current Outpatient Prescriptions   Medication  Sig  Dispense  Refill   .  aspirin 81 MG tablet  Take 81 mg by mouth daily.     .  carvedilol (COREG) 25 MG tablet  Take 25 mg by mouth 2 (two) times daily with a meal.     .  gabapentin (NEURONTIN) 100 MG capsule  Take 100 mg by mouth 2 (two) times daily. PRN     .  glyBURIDE (DIABETA)  5 MG tablet  Take 5 mg by mouth 2 (two) times daily with a meal.     .  hydrALAZINE (APRESOLINE) 25 MG tablet  Take 25 mg by mouth 3 (three) times daily.     .  insulin aspart (NOVOLOG FLEXPEN) 100 UNIT/ML FlexPen  Inject into the skin 3 (three) times daily with meals.     .  isosorbide mononitrate (IMDUR) 30 MG 24 hr tablet  Take 30 mg by mouth daily.      No current facility-administered medications for this visit.    No Known Allergies   Review of Systems   Constitutional: Positive for activity change, appetite change and fatigue. Negative for fever, chills, diaphoresis and unexpected weight change.  HENT: Negative.  Eyes:  Describes intermittent bilateral loss of vision when standing and only sees Dymon Summerhill color. No amaurosis.  Respiratory: Positive for shortness of breath. Negative for cough and chest tightness.  Cardiovascular: Positive for chest pain. Negative for palpitations and leg swelling.  Gastrointestinal: Negative.  Endocrine: Negative.  Genitourinary: Negative.  Musculoskeletal:  Had amputation of the left 3rd and 4th toe and has been told that he needs amputation of the left  2nd and 5th toes. He has poor vascular supply to the left foot.  Skin: Negative.  Allergic/Immunologic: Negative.  Neurological: Positive for dizziness and light-headedness. Negative for seizures, syncope, speech difficulty, numbness and headaches.  Hematological: Negative.  Psychiatric/Behavioral: Negative.   BP 99/71  Pulse 88  Resp 20  Ht 6\' 1"  (1.854 m)  Wt 225 lb (102.059 kg)  BMI 29.69 kg/m2  SpO2 94%  Physical Exam   Constitutional: He is oriented to person, place, and time.  Chronically-ill appearing Nikolus Marczak male in no distress.  HENT:  Head: Normocephalic and atraumatic.  Mouth/Throat: Oropharynx is clear and moist.  Eyes: EOM are normal. Pupils are equal, round, and reactive to light.  Neck: Normal range of motion. Neck supple. No JVD present. No thyromegaly present.  Cardiovascular: Normal rate, regular rhythm and normal heart sounds.  No murmur heard. Pedal pulses not palpable  Pulmonary/Chest: Effort normal and breath sounds normal. No respiratory distress. He has no rales. He exhibits no tenderness.  Abdominal: Soft. Bowel sounds are normal. He exhibits no distension and no mass. There is no tenderness.  Musculoskeletal: He exhibits no edema.  Left 3rd and 4th toe amputation with exposed cartilage or bone and open wound. Does not appear infected.  Lymphadenopathy:  He has no cervical adenopathy.  Neurological: He is alert and oriented to person, place, and time. He has normal strength. No cranial nerve deficit or sensory deficit.  Skin: Skin is warm and dry.  Psychiatric: He has a normal mood and affect.    Diagnostic Tests:   Cardiac Cath at [facility] reviewed on CD. This shows diffuse narrowing of the mid LAD to about 70%. The 2nd diagonal is a moderat-sized vessel with a 70% stenosis. The LCX gives off a single large OM with an 80% stenosis at the bifurcation of this vessel from the LCX. The distal LCX is small. The RCA has a 99% proximal stenosis and then is  occluded in the mid portion with left to right collat filling the distal vessel. LVEF is 40%.  Impression:   He has severe multi-vessel coronary artery disease with moderate LV dysfunction and CCS class III symptoms. I agree that CABG is the best treatment for him. His wife says that his creatinine was 1.7 before cath yesterday so I think we should  wait at least a weak to let his kidneys recover from the cath before doing surgery. He and wife would like to wait until June 15th when she is finished the school year since she is a Control and instrumentation engineer. I discussed the operative procedure with the patient and his wife including alternatives, benefits and risks; including but not limited to bleeding, blood transfusion, infection, stroke, myocardial infarction, graft failure, heart block requiring a permanent pacemaker, organ dysfunction, and death. Ivar Drape understands and agrees to proceed.   Plan:   CABG

## 2014-05-07 NOTE — Procedures (Signed)
Extubation Procedure Note  Patient Details:   Name: [name] [name] DOB: Apr 06, 1961 MRN: [id number]   Evaluation  O2 sats: stable throughout Complications: No apparent complications Patient did tolerate procedure well. BBS: Clear, Diminished  Pt ability to speak: Yes  Pt is able to breath around cuff. NIF -20, FVC 1.1L. ABG WNL. Pt weaned and extubated per SICU rapid wean. Pt placed on 3L N/C. Pt oriented to place and person. No stridor. Doroteo Bradford 05/07/2014, 8:33 PM

## 2014-05-07 NOTE — Op Note (Signed)
CARDIOVASCULAR SURGERY OPERATIVE NOTE  05/07/2014  Surgeon:  Gaye Pollack, MD  First Assistant: Lars Pinks,  PA-C   Preoperative Diagnosis:  Severe multi-vessel coronary artery disease   Postoperative Diagnosis:  Same   Procedure:  1. Median Sternotomy 2. Extracorporeal circulation 3.   Coronary artery bypass grafting x 4   Left internal mammary graft to the LAD  SVG to Diagonal  SVG to OM  SVG to PDA 4.   Endoscopic vein harvest from the right leg   Anesthesia:  General Endotracheal   Clinical History/Surgical Indication:  The patient is a 53 year old gentleman with a long history of diabetes and hypertension and a family history of premature coronary artery disease who underwent surgery on his left foot in September 2014 for a non-healing wound. 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 Postop he reports having shortness of breath and pulmonary edema leading to a cardiac workup at [facility] with an echo showing a low ejection fraction of 15%. His wife says that he developed renal failure with a creatinine that rose to over 5 and it was felt that he had nephrotoxicity due to an antibiotic. He had a kidney biopsy and was treated with steroids with gradual improvement. He was in the hospital for several weeks and discharged with an external defibrillator. He was treated medically and was found to have an improvement in his EF. The external defibrillator was removed. He had a stress cardiolyte in March 2015 showing a fixed inferior and reversible lateral defect with an EF of 40%. He reports intermittent atypical chest discomfort that he describes as a pain on the outside of his chest with sensitivity of the skin to touch. He denies any chest pressure or heaviness. He has had some exertional dyspnea and has been very fatigued. He has had dizziness and some falls that he thinks  is due to the medication he is on. Cardiac Cath at [facility] reviewed on CD. This shows diffuse narrowing of the mid LAD to about 70%. The 2nd diagonal is a moderat-sized vessel with a 70% stenosis. The LCX gives off a single large OM with an 80% stenosis at the bifurcation of this vessel from the LCX. The distal LCX is small. The RCA has a 99% proximal stenosis and then is occluded in the mid portion with left to right collat filling the distal vessel. LVEF is 40%. He has severe multi-vessel coronary artery disease with moderate LV dysfunction and CCS class III symptoms. I agree that CABG is the best treatment for him. His wife says that his creatinine was 1.7 before cath yesterday so I think we should wait at least a weak to let his kidneys recover from the cath before doing surgery. He and wife would like to wait until June 15th when she is finished the school year since she is a Control and instrumentation engineer. I discussed the operative procedure with the patient and his wife including alternatives, benefits and risks; including but not limited to bleeding, blood transfusion, infection, stroke, myocardial infarction, graft failure, heart block requiring a permanent pacemaker, organ dysfunction, and death. [name] [name] understands and agrees to proceed.    Preparation:  The patient was seen in the preoperative holding area and the correct patient, correct operation were confirmed with the patient after reviewing the medical record and catheterization. The consent was signed by me. Preoperative antibiotics were given. A pulmonary arterial line and radial arterial line were placed by the anesthesia team. The patient was taken back to  the operating room and positioned supine on the operating room table. After being placed under general endotracheal anesthesia by the anesthesia team a foley catheter was placed. The neck, chest, abdomen, and both legs were prepped with betadine soap and solution and draped in the usual  sterile manner. A surgical time-out was taken and the correct patient and operative procedure were confirmed with the nursing and anesthesia staff.   Cardiopulmonary Bypass:  A median sternotomy was performed. The pericardium was opened in the midline. Right ventricular function appeared normal. The ascending aorta was of normal size and had no palpable plaque. There were no contraindications to aortic cannulation or cross-clamping. The patient was fully systemically heparinized and the ACT was maintained > 400 sec. The proximal aortic arch was cannulated with a 20 F aortic cannula for arterial inflow. Venous cannulation was performed via the right atrial appendage using a two-staged venous cannula. An antegrade cardioplegia/vent cannula was inserted into the mid-ascending aorta. Aortic occlusion was performed with a single cross-clamp. Systemic cooling to 32 degrees Centigrade and topical cooling of the heart with iced saline were used. Hyperkalemic antegrade cold blood cardioplegia was used to induce diastolic arrest and was then given at about 20 minute intervals throughout the period of arrest to maintain myocardial temperature at or below 10 degrees centigrade. A temperature probe was inserted into the interventricular septum and an insulating pad was placed in the pericardium.   Left internal mammary harvest:  The left side of the sternum was retracted using the Rultract retractor. The left internal mammary artery was harvested as a pedicle graft. All side branches were clipped. It was a medium-sized vessel of good quality with excellent blood flow. It was ligated distally and divided. It was sprayed with topical papaverine solution to prevent vasospasm.   Endoscopic vein harvest:  The right greater saphenous vein was harvested endoscopically through a 2 cm incision medial to the right knee. It was harvested from the upper thigh to below the knee. It was a medium-sized vein of good quality. The  side branches were all ligated with 4-0 silk ties.    Coronary arteries:  The coronary arteries were examined. They were all fragile, relatively small, diabetic type vessels.   LAD:  Intramyocardial throughout its whole length. Located in the mid portion where it was free of disease. The diagonal was a moderate-sized vessel that was long and had no significant distal disease.  LCX:  The OM was a moderate-sized vessel with no distal disease. The distal LCX branch was small.  RCA:  The PDA was small but graftable. There were a few small PL branches that were too small to graft. The inferior wall had patchy scar.   Grafts:  1. LIMA to the LAD: 1.75 mm. It was sewn end to side using 8-0 prolene continuous suture. 2. SVG to Diagonal:  1.6 mm. It was sewn end to side using 7-0 prolene continuous suture. 3. SVG to OM:  1.75 mm. It was sewn end to side using 7-0 prolene continuous suture. 4. SVG to PDA:  1.6 mm. It was sewn end to side using 7-0 prolene continuous suture.  The proximal vein graft anastomoses were performed to the mid-ascending aorta using continuous 6-0 prolene suture. Graft markers were placed around the proximal anastomoses.   Completion:  The patient was rewarmed to 37 degrees Centigrade. The clamp was removed from the LIMA pedicle and there was rapid warming of the septum and return of ventricular fibrillation. The crossclamp was removed  with a time of 102 minutes. There was spontaneous return of sinus rhythm. The distal and proximal anastomoses were checked for hemostasis. The position of the grafts was satisfactory. Two temporary epicardial pacing wires were placed on the right atrium and two on the right ventricle. The patient was weaned from CPB without difficulty on no inotropes. CPB time was 122 minutes. Cardiac output was 8 LPM. Heparin was fully reversed with protamine and the aortic and venous cannulas removed. Hemostasis was achieved. Mediastinal and left pleural  drainage tubes were placed. The sternum was closed with double #6 stainless steel wires. The fascia was closed with continuous # 1 vicryl suture. The subcutaneous tissue was closed with 2-0 vicryl continuous suture. The skin was closed with 3-0 vicryl subcuticular suture. All sponge, needle, and instrument counts were reported correct at the end of the case. Dry sterile dressings were placed over the incisions and around the chest tubes which were connected to pleurevac suction. The patient was then transported to the surgical intensive care unit in critical but stable condition.

## 2014-05-07 NOTE — Anesthesia Preprocedure Evaluation (Addendum)
Anesthesia Evaluation  Patient identified by MRN, date of birth, ID band Patient awake    Reviewed: Allergy & Precautions, H&P , NPO status , Patient's Chart, lab work & pertinent test results  Airway Mallampati: I TM Distance: >3 FB Neck ROM: Full    Dental  (+) Teeth Intact, Dental Advisory Given   Pulmonary shortness of breath,  breath sounds clear to auscultation        Cardiovascular hypertension, Pt. on medications and Pt. on home beta blockers + CAD, + Past MI and +CHF Rhythm:Regular Rate:Normal     Neuro/Psych    GI/Hepatic   Endo/Other  diabetes, Well Controlled, Type 2, Oral Hypoglycemic Agents, Insulin Dependent  Renal/GU Renal disease     Musculoskeletal   Abdominal   Peds  Hematology   Anesthesia Other Findings   Reproductive/Obstetrics                          Anesthesia Physical Anesthesia Plan  ASA: IV  Anesthesia Plan: General   Post-op Pain Management:    Induction: Intravenous  Airway Management Planned: Oral ETT  Additional Equipment: Arterial line, PA Cath and TEE  Intra-op Plan:   Post-operative Plan: Post-operative intubation/ventilation  Informed Consent: I have reviewed the patients History and Physical, chart, labs and discussed the procedure including the risks, benefits and alternatives for the proposed anesthesia with the patient or authorized representative who has indicated his/her understanding and acceptance.   Dental advisory given  Plan Discussed with: CRNA, Anesthesiologist and Surgeon  Anesthesia Plan Comments:         Anesthesia Quick Evaluation

## 2014-05-07 NOTE — Interval H&P Note (Signed)
History and Physical Interval Note:  05/07/2014 7:26 AM  [name] [name]  has presented today for surgery, with the diagnosis of CAD  The various methods of treatment have been discussed with the patient and family. After consideration of risks, benefits and other options for treatment, the patient has consented to  Procedure(s): CORONARY ARTERY BYPASS GRAFTING (CABG) (N/A) INTRAOPERATIVE TRANSESOPHAGEAL ECHOCARDIOGRAM (N/A) as a surgical intervention .  The patient's history has been reviewed, patient examined, no change in status, stable for surgery.  I have reviewed the patient's chart and labs.  Questions were answered to the patient's satisfaction.     Gaye Pollack

## 2014-05-07 NOTE — OR Nursing (Signed)
SICU First call @ 1223

## 2014-05-07 NOTE — Anesthesia Procedure Notes (Addendum)
Procedure Name: Intubation Date/Time: 05/07/2014 7:44 AM Performed by: Susa Loffler Pre-anesthesia Checklist: Patient identified, Timeout performed, Emergency Drugs available, Suction available and Patient being monitored Patient Re-evaluated:Patient Re-evaluated prior to inductionOxygen Delivery Method: Circle system utilized Preoxygenation: Pre-oxygenation with 100% oxygen Intubation Type: IV induction Ventilation: Mask ventilation without difficulty and Oral airway inserted - appropriate to patient size Laryngoscope Size: Mac and 3 Grade View: Grade II Tube type: Oral Tube size: 8.0 mm Number of attempts: 1 Airway Equipment and Method: Stylet and Oral airway Placement Confirmation: ETT inserted through vocal cords under direct vision,  positive ETCO2 and breath sounds checked- equal and bilateral Secured at: 22 cm Tube secured with: Tape Dental Injury: Teeth and Oropharynx as per pre-operative assessment       Right IJ CV Introducer placement.

## 2014-05-07 NOTE — Brief Op Note (Signed)
05/07/2014  11:38 AM  PATIENT:  [name] [name]  53 y.o. male  PRE-OPERATIVE DIAGNOSIS:  CAD  POST-OPERATIVE DIAGNOSIS:  CAD  PROCEDURE:INTRAOPERATIVE TRANSESOPHAGEAL ECHOCARDIOGRAM, CORONARY ARTERY BYPASS GRAFTING (CABG) x  4 (LIMA to LAD, SVG to DIAGONAl, SVG to OM, SVG to PDA) using left internal mammary artery and endoscopically harvested right thigh and lower leg saphenous vein   SURGEON:  Surgeon(s) and Role:    * Gaye Pollack, MD - Primary  PHYSICIAN ASSISTANT: Lars Pinks PA-C   ANESTHESIA:   general  EBL:  Total I/O In: -  Out: 600 [Urine:600]   DRAINS: Chest tubes in the mediastinal and pleural spaces   COUNTS CORRECT:  YES  DICTATION: .Dragon Dictation  PLAN OF CARE: Admit to inpatient   PATIENT DISPOSITION:  ICU - intubated and hemodynamically stable.   Delay start of Pharmacological VTE agent (>24hrs) due to surgical blood loss or risk of bleeding: yes  BASELINE WEIGHT: 103 kg

## 2014-05-07 NOTE — Progress Notes (Signed)
Echocardiogram Echocardiogram Transesophageal has been performed.  Joelene Millin 05/07/2014, 9:17 AM

## 2014-05-08 ENCOUNTER — Inpatient Hospital Stay (HOSPITAL_COMMUNITY): Payer: BC Managed Care – PPO

## 2014-05-08 ENCOUNTER — Encounter (HOSPITAL_COMMUNITY): Payer: Self-pay | Admitting: Surgery

## 2014-05-08 DIAGNOSIS — E118 Type 2 diabetes mellitus with unspecified complications: Secondary | ICD-10-CM

## 2014-05-08 LAB — GLUCOSE, CAPILLARY
GLUCOSE-CAPILLARY: 103 mg/dL — AB (ref 70–99)
GLUCOSE-CAPILLARY: 101 mg/dL — AB (ref 70–99)
GLUCOSE-CAPILLARY: 100 mg/dL — AB (ref 70–99)
GLUCOSE-CAPILLARY: 112 mg/dL — AB (ref 70–99)
GLUCOSE-CAPILLARY: 95 mg/dL (ref 70–99)
GLUCOSE-CAPILLARY: 110 mg/dL — AB (ref 70–99)
Glucose-Capillary: 100 mg/dL — ABNORMAL HIGH (ref 70–99)
Glucose-Capillary: 100 mg/dL — ABNORMAL HIGH (ref 70–99)
Glucose-Capillary: 102 mg/dL — ABNORMAL HIGH (ref 70–99)
Glucose-Capillary: 104 mg/dL — ABNORMAL HIGH (ref 70–99)
Glucose-Capillary: 109 mg/dL — ABNORMAL HIGH (ref 70–99)
Glucose-Capillary: 113 mg/dL — ABNORMAL HIGH (ref 70–99)
Glucose-Capillary: 117 mg/dL — ABNORMAL HIGH (ref 70–99)
Glucose-Capillary: 117 mg/dL — ABNORMAL HIGH (ref 70–99)
Glucose-Capillary: 121 mg/dL — ABNORMAL HIGH (ref 70–99)
Glucose-Capillary: 144 mg/dL — ABNORMAL HIGH (ref 70–99)
Glucose-Capillary: 149 mg/dL — ABNORMAL HIGH (ref 70–99)
Glucose-Capillary: 184 mg/dL — ABNORMAL HIGH (ref 70–99)
Glucose-Capillary: 93 mg/dL (ref 70–99)

## 2014-05-08 LAB — CBC
HCT: 24.5 % — ABNORMAL LOW (ref 39.0–52.0)
HCT: 24 % — ABNORMAL LOW (ref 39.0–52.0)
HEMOGLOBIN: 8 g/dL — AB (ref 13.0–17.0)
Hemoglobin: 8.3 g/dL — ABNORMAL LOW (ref 13.0–17.0)
MCH: 29.3 pg (ref 26.0–34.0)
MCH: 29 pg (ref 26.0–34.0)
MCHC: 33.9 g/dL (ref 30.0–36.0)
MCHC: 33.3 g/dL (ref 30.0–36.0)
MCV: 86.6 fL (ref 78.0–100.0)
MCV: 87 fL (ref 78.0–100.0)
PLATELETS: 159 10*3/uL (ref 150–400)
PLATELETS: 150 10*3/uL (ref 150–400)
RBC: 2.83 MIL/uL — ABNORMAL LOW (ref 4.22–5.81)
RBC: 2.76 MIL/uL — ABNORMAL LOW (ref 4.22–5.81)
RDW: 12.9 % (ref 11.5–15.5)
RDW: 13 % (ref 11.5–15.5)
WBC: 11.9 10*3/uL — ABNORMAL HIGH (ref 4.0–10.5)
WBC: 12.7 10*3/uL — ABNORMAL HIGH (ref 4.0–10.5)

## 2014-05-08 LAB — POCT I-STAT, CHEM 8
BUN: 26 mg/dL — ABNORMAL HIGH (ref 6–23)
CHLORIDE: 105 meq/L (ref 96–112)
CREATININE: 1.4 mg/dL — AB (ref 0.50–1.35)
Calcium, Ion: 1.28 mmol/L — ABNORMAL HIGH (ref 1.12–1.23)
GLUCOSE: 166 mg/dL — AB (ref 70–99)
HEMATOCRIT: 27 % — AB (ref 39.0–52.0)
Hemoglobin: 9.2 g/dL — ABNORMAL LOW (ref 13.0–17.0)
Potassium: 4.7 mEq/L (ref 3.7–5.3)
Sodium: 140 mEq/L (ref 137–147)
TCO2: 20 mmol/L (ref 0–100)

## 2014-05-08 LAB — BASIC METABOLIC PANEL
BUN: 27 mg/dL — AB (ref 6–23)
CALCIUM: 8.4 mg/dL (ref 8.4–10.5)
CO2: 22 mEq/L (ref 19–32)
CREATININE: 1.34 mg/dL (ref 0.50–1.35)
Chloride: 106 mEq/L (ref 96–112)
GFR calc non Af Amer: 60 mL/min — ABNORMAL LOW (ref >90)
GFR, EST AFRICAN AMERICAN: 69 mL/min — AB (ref >90)
Glucose, Bld: 109 mg/dL — ABNORMAL HIGH (ref 70–99)
Potassium: 4.7 mEq/L (ref 3.7–5.3)
Sodium: 140 mEq/L (ref 137–147)

## 2014-05-08 LAB — POCT I-STAT 3, ART BLOOD GAS (G3+)
Acid-base deficit: 4 mmol/L — ABNORMAL HIGH (ref 0.0–2.0)
Bicarbonate: 22 mEq/L (ref 20.0–24.0)
O2 SAT: 97 %
TCO2: 23 mmol/L (ref 0–100)
pCO2 arterial: 40.2 mmHg (ref 35.0–45.0)
pH, Arterial: 7.344 — ABNORMAL LOW (ref 7.350–7.450)
pO2, Arterial: 97 mmHg (ref 80.0–100.0)

## 2014-05-08 LAB — CREATININE, SERUM
CREATININE: 1.36 mg/dL — AB (ref 0.50–1.35)
GFR calc Af Amer: 68 mL/min — ABNORMAL LOW (ref >90)
GFR calc non Af Amer: 59 mL/min — ABNORMAL LOW (ref >90)

## 2014-05-08 LAB — MAGNESIUM
Magnesium: 2.8 mg/dL — ABNORMAL HIGH (ref 1.5–2.5)
Magnesium: 2.8 mg/dL — ABNORMAL HIGH (ref 1.5–2.5)

## 2014-05-08 MED ORDER — MUPIROCIN 2 % EX OINT
1.0000 "application " | TOPICAL_OINTMENT | Freq: Two times a day (BID) | CUTANEOUS | Status: AC
  Administered 2014-05-08 – 2014-05-09 (×3): 1 via NASAL

## 2014-05-08 MED ORDER — INSULIN DETEMIR 100 UNIT/ML ~~LOC~~ SOLN
30.0000 [IU] | Freq: Every day | SUBCUTANEOUS | Status: DC
  Administered 2014-05-08 – 2014-05-09 (×2): 30 [IU] via SUBCUTANEOUS
  Filled 2014-05-08 (×3): qty 0.3

## 2014-05-08 MED ORDER — ENOXAPARIN SODIUM 40 MG/0.4ML ~~LOC~~ SOLN
40.0000 mg | Freq: Every day | SUBCUTANEOUS | Status: DC
  Administered 2014-05-08 – 2014-05-14 (×7): 40 mg via SUBCUTANEOUS
  Filled 2014-05-08 (×8): qty 0.4

## 2014-05-08 MED ORDER — CHLORHEXIDINE GLUCONATE CLOTH 2 % EX PADS
6.0000 | MEDICATED_PAD | Freq: Every day | CUTANEOUS | Status: AC
  Administered 2014-05-08 – 2014-05-11 (×3): 6 via TOPICAL

## 2014-05-08 MED ORDER — INSULIN DETEMIR 100 UNIT/ML ~~LOC~~ SOLN: 30.0000 [IU] | Freq: Every day | SUBCUTANEOUS | Status: DC

## 2014-05-08 MED ORDER — INSULIN ASPART 100 UNIT/ML ~~LOC~~ SOLN
0.0000 [IU] | SUBCUTANEOUS | Status: DC
  Administered 2014-05-08: 4 [IU] via SUBCUTANEOUS
  Administered 2014-05-08 – 2014-05-10 (×4): 2 [IU] via SUBCUTANEOUS

## 2014-05-08 MED FILL — Heparin Sodium (Porcine) Inj 1000 Unit/ML: INTRAMUSCULAR | Qty: 30 | Status: AC

## 2014-05-08 MED FILL — Sodium Chloride IV Soln 0.9%: INTRAVENOUS | Qty: 2000 | Status: AC

## 2014-05-08 MED FILL — Sodium Bicarbonate IV Soln 8.4%: INTRAVENOUS | Qty: 50 | Status: AC

## 2014-05-08 MED FILL — Lidocaine HCl IV Inj 20 MG/ML: INTRAVENOUS | Qty: 5 | Status: AC

## 2014-05-08 MED FILL — Potassium Chloride Inj 2 mEq/ML: INTRAVENOUS | Qty: 40 | Status: AC

## 2014-05-08 MED FILL — Mannitol IV Soln 20%: INTRAVENOUS | Qty: 500 | Status: AC

## 2014-05-08 MED FILL — Electrolyte-R (PH 7.4) Solution: INTRAVENOUS | Qty: 4000 | Status: AC

## 2014-05-08 MED FILL — Magnesium Sulfate Inj 50%: INTRAMUSCULAR | Qty: 10 | Status: AC

## 2014-05-08 NOTE — Progress Notes (Signed)
1 Day Post-Op Procedure(s) (LRB): CORONARY ARTERY BYPASS GRAFTING (CABG) (N/A) INTRAOPERATIVE TRANSESOPHAGEAL ECHOCARDIOGRAM (N/A) Subjective: No complaints   Objective: Vital signs in last 24 hours: Temp:  [96.4 F (35.8 C)-98.6 F (37 C)] 98.4 F (36.9 C) (06/23 0700) Pulse Rate:  [83-103] 90 (06/23 0700) Cardiac Rhythm:  [-] Normal sinus rhythm (06/23 0700) Resp:  [5-23] 15 (06/23 0700) BP: (86-115)/(51-69) 96/59 mmHg (06/23 0700) SpO2:  [95 %-100 %] 100 % (06/23 0700) Arterial Line BP: (85-119)/(49-83) 102/52 mmHg (06/23 0700) FiO2 (%):  [40 %-50 %] 40 % (06/22 2002) Weight:  [109.1 kg (240 lb 8.4 oz)] 109.1 kg (240 lb 8.4 oz) (06/23 0500)  Hemodynamic parameters for last 24 hours: PAP: (18-34)/(6-18) 21/10 mmHg CO:  [4.5 L/min-6.2 L/min] 5.3 L/min CI:  [2 L/min/m2-2.7 L/min/m2] 2.4 L/min/m2  Intake/Output from previous day: 06/22 0701 - 06/23 0700 In: 6776.7 [P.O.:200; I.V.:4736.7; Blood:390; IV Piggyback:1450] Out: [id number]; Blood:1855; Chest Tube:490] Intake/Output this shift:    General appearance: alert and cooperative Neurologic: intact Heart: regular rate and rhythm, S1, S2 normal, no murmur, click, rub or gallop Lungs: clear to auscultation bilaterally Extremities: edema mild Wound: dressings dry  Lab Results:  Recent Labs  05/07/14 1945 05/07/14 1954 05/08/14 0357  WBC 12.6*  --  11.9*  HGB 9.0* 9.5* 8.3*  HCT 27.0* 28.0* 24.5*  PLT 172  --  159   BMET:  Recent Labs  05/07/14 1954 05/08/14 0357  NA 142 140  [name] 3.9 4.7  CL 106 106  CO2  --  22  GLUCOSE 117* 109*  BUN 25* 27*  CREATININE 1.30 1.34  CALCIUM  --  8.4    PT/INR:  Recent Labs  05/07/14 1400  LABPROT 16.4*  INR 1.36   ABG    Component Value Date/Time   PHART 7.344* 05/07/2014 2132   HCO3 22.0 05/07/2014 2132   TCO2 23 05/07/2014 2132   ACIDBASEDEF 4.0* 05/07/2014 2132   O2SAT 97.0 05/07/2014 2132   CBG (last 3)   Recent Labs  05/08/14 0104 05/08/14 0156  05/08/14 0304  GLUCAP 100* 103* 101*    Assessment/Plan: S/P Procedure(s) (LRB): CORONARY ARTERY BYPASS GRAFTING (CABG) (N/A) INTRAOPERATIVE TRANSESOPHAGEAL ECHOCARDIOGRAM (N/A) He is still on low dose dopamine and neo to maintain BP. Wean as tolerated. Moderate LV dysfunction. Hold off on beta blocker until BP stable off pressors. Chronic kidney disease with baseline creatinine 1.62 preop. GFR 48. Observe. Mobilize Type II Diabetes: Start Levemir and SSI Q4.  Expected acute blood loss anemia: observe and start iron. Continue foley due to patient in ICU and urinary output monitoring DC chest tubes. See progression orders   LOS: 1 day    [name] [name],[name] [name] 05/08/2014

## 2014-05-08 NOTE — Anesthesia Postprocedure Evaluation (Signed)
  Anesthesia Post-op Note  Patient: Rakem Rabun  Procedure(s) Performed: Procedure(s) with comments: CORONARY ARTERY BYPASS GRAFTING (CABG) (N/A) - Times 4 using left internal mammary artery and endoscopically harvested right saphenous vein INTRAOPERATIVE TRANSESOPHAGEAL ECHOCARDIOGRAM (N/A)  Patient Location: SICU  Anesthesia Type:General  Level of Consciousness: awake and alert   Airway and Oxygen Therapy: Patient Spontanous Breathing  Post-op Pain: mild  Post-op Assessment: Post-op Vital signs reviewed, Patient's Cardiovascular Status Stable and Respiratory Function Stable  Post-op Vital Signs: Reviewed and stable  Last Vitals:  Filed Vitals:   05/08/14 1700  BP: 98/52  Pulse: 87  Temp:   Resp: 12    Complications: No apparent anesthesia complications

## 2014-05-08 NOTE — Op Note (Signed)
[name], [name] [name] NO.:  [id number]  MEDICAL RECORD NO.:  [id number]  LOCATION:  2S07C                        FACILITY:  [facility]  PHYSICIAN:  Ala Dach, M.D.DATE OF BIRTH:  1961-04-04  DATE OF PROCEDURE:  05/07/2014 DATE OF DISCHARGE:                              OPERATIVE REPORT   INDICATION FOR PROCEDURE:  [name] is a 53 year old gentleman who presents today for coronary artery bypass grafting to be performed by Dr. Gilford Raid.  It has been requested that we place the TEE probe for evaluation of cardiac structures and function.  The patient brought to the holding area on the morning of surgery where under local anesthesia with sedation pulmonary artery and radial arterial lines were started.  He was then to the OR for routine induction of general anesthesia, after which the TEE probe was prepared, then passed oropharyngeal into the stomach for imaging of the cardiac structures.  PRECARDIOPULMONARY BYPASS TEE EXAMINATION:  Left ventricle:  Left ventricular chamber is seen initially in the short axis view.  There is mild-to-moderate reduction of the left ventricular chamber function appreciated.  Overall, left ventricular diameter thickness is within normal.  There is an area pronounced hypokinesis in the septal area. There is an area of significant hypokinesis to akinesis in the inferior wall itself.  Overall, there is good satisfactory contractile pattern appreciated then turned laterally in lateral wall of this chamber seen in the, but the short and long axis views.  Estimated ejection fraction is approximately 35%.  Mitral valve:  Mitral valve is thin, compliant, normal apparatus.  There is satisfactory coaptation point.  There is only trivial mitral regurgitant flow appreciated.  Right and left atrium:  Left atrial chamber is interrogated.  There are no masses appreciated.  The left atrial appendage is clear.  Aortic valve:  Trileaflet  aortic valve.  The cusps were thin, compliant, mobile.  There is neither stenosis or aortic insufficiency appreciated.  Right ventricle:  This is addendum contractile right ventricular chamber structure.  Right atrial and tricuspid valves are normal.  The patient was placed on cardiopulmonary bypass.  Coronary artery bypass grafting is carried out.  POST CARDIOPULMONARY BYPASS TEE EXAMINATION:  Left ventricle:  The left ventricular chamber now is seen in the short axis view again.  The areas of the anterior and anterolateral walls are dynamic and contractile. There is severe hypokinesis noted in the inferior wall.  There is hypokinesis noted in the septal wall.  This is no change from the prebypass.  Overall, the contractile pattern with the addition of inotropes is improved and satisfactory.  The rest of the cardiac examination was unchanged, and the patient returned to the cardiac intensive care unit in stable condition.          ______________________________ Ala Dach, M.D.     JTM/MEDQ  D:  05/07/2014  T:  05/08/2014  Job:  [id number]

## 2014-05-08 NOTE — Consult Note (Signed)
WOC wound consult note Reason for Consult: foot wound. Wife provides most of history at the bedside today. Pt with history of multiple foot surgeries and loss of several toes on the left foot. He is followed by Dr. Vickki Muff in [place]. However with cardiac issues the foot wound has not been really a priority for them.   He has had a NPWT VAC in the past. He has not had any open wound recently.  He has been covering two "stable" areas per the wife for several weeks with dry dressings. He has palpable pulses and no significant edema.  He has neuropathy. Wound type: Neuropathic foot ulcer at the 2nd metatarsal, plantar surface. Pressure Ulcer POA: No Measurement: 2.0cm x 2.5cm x 0.5cm  Wound bed: pink, but very thick hyperkeratotic periwound skin  Drainage (amount, consistency, odor) minimal  Periwound: see above, some scarring noted dorsal foot from previous surgeries, well healed. Dressing procedure/placement/frequency: Silver hydrofiber packing into the open area while hospitalized, cover with dry dressing. Change 3x wk.  Off load if possible. Follow up with Dr. Vickki Muff once recovered from current surgery.   Discussed POC with patient and bedside nurse.  Re consult if needed, will not follow at this time. Thanks  Melody Kellogg, [place] [phone])

## 2014-05-08 NOTE — Progress Notes (Signed)
Utilization Review Completed.Donne Anon T6/23/2015

## 2014-05-09 ENCOUNTER — Inpatient Hospital Stay (HOSPITAL_COMMUNITY): Payer: BC Managed Care – PPO

## 2014-05-09 LAB — GLUCOSE, CAPILLARY
GLUCOSE-CAPILLARY: 103 mg/dL — AB (ref 70–99)
GLUCOSE-CAPILLARY: 83 mg/dL (ref 70–99)
GLUCOSE-CAPILLARY: 92 mg/dL (ref 70–99)
Glucose-Capillary: 157 mg/dL — ABNORMAL HIGH (ref 70–99)
Glucose-Capillary: 68 mg/dL — ABNORMAL LOW (ref 70–99)
Glucose-Capillary: 93 mg/dL (ref 70–99)

## 2014-05-09 LAB — CBC
HCT: 22.1 % — ABNORMAL LOW (ref 39.0–52.0)
Hemoglobin: 7.5 g/dL — ABNORMAL LOW (ref 13.0–17.0)
MCH: 30.1 pg (ref 26.0–34.0)
MCHC: 33.9 g/dL (ref 30.0–36.0)
MCV: 88.8 fL (ref 78.0–100.0)
PLATELETS: 118 10*3/uL — AB (ref 150–400)
RBC: 2.49 MIL/uL — ABNORMAL LOW (ref 4.22–5.81)
RDW: 13.1 % (ref 11.5–15.5)
WBC: 9 10*3/uL (ref 4.0–10.5)

## 2014-05-09 LAB — BASIC METABOLIC PANEL
BUN: 31 mg/dL — AB (ref 6–23)
CO2: 22 meq/L (ref 19–32)
CREATININE: 1.41 mg/dL — AB (ref 0.50–1.35)
Calcium: 8.5 mg/dL (ref 8.4–10.5)
Chloride: 104 mEq/L (ref 96–112)
GFR calc Af Amer: 65 mL/min — ABNORMAL LOW (ref >90)
GFR calc non Af Amer: 56 mL/min — ABNORMAL LOW (ref >90)
Glucose, Bld: 98 mg/dL (ref 70–99)
Potassium: 4.7 mEq/L (ref 3.7–5.3)
Sodium: 139 mEq/L (ref 137–147)

## 2014-05-09 LAB — PREPARE RBC (CROSSMATCH)

## 2014-05-09 MED ORDER — FUROSEMIDE 10 MG/ML IJ SOLN
80.0000 mg | Freq: Once | INTRAMUSCULAR | Status: AC
  Administered 2014-05-09: 80 mg via INTRAVENOUS
  Filled 2014-05-09: qty 8

## 2014-05-09 NOTE — Progress Notes (Signed)
2 Days Post-Op Procedure(s) (LRB): CORONARY ARTERY BYPASS GRAFTING (CABG) (N/A) INTRAOPERATIVE TRANSESOPHAGEAL ECHOCARDIOGRAM (N/A) Subjective:  weak  Objective: Vital signs in last 24 hours: Temp:  [97.7 F (36.5 C)-98.2 F (36.8 C)] 97.8 F (36.6 C) (06/24 0806) Pulse Rate:  [73-90] 86 (06/24 0700) Cardiac Rhythm:  [-] Normal sinus rhythm (06/23 2000) Resp:  [11-25] 20 (06/24 0700) BP: (87-114)/(48-64) 104/61 mmHg (06/24 0700) SpO2:  [88 %-100 %] 97 % (06/24 0700) Arterial Line BP: (82-109)/(41-57) 107/45 mmHg (06/24 0700) Weight:  [109.9 kg (242 lb 4.6 oz)] 109.9 kg (242 lb 4.6 oz) (06/24 0600)  Hemodynamic parameters for last 24 hours: PAP: (26)/(15) 26/15 mmHg  Intake/Output from previous day: 06/23 0701 - 06/24 0700 In: 896 [P.O.:120; I.V.:726; IV Piggyback:50] Out: 1045 [Urine:1025; Chest Tube:20] Intake/Output this shift:    General appearance: fatigued and no distress Neurologic: intact Heart: regular rate and rhythm, S1, S2 normal, no murmur, click, rub or gallop Lungs: clear to auscultation bilaterally Extremities: edema mild Wound: dressing dry  Lab Results:  Recent Labs  05/08/14 1645 05/08/14 1648 05/09/14 0500  WBC 12.7*  --  9.0  HGB 8.0* 9.2* 7.5*  HCT 24.0* 27.0* 22.1*  PLT 150  --  118*   BMET:  Recent Labs  05/08/14 0357  05/08/14 1648 05/09/14 0500  NA 140  --  140 139  K 4.7  --  4.7 4.7  CL 106  --  105 104  CO2 22  --   --  22  GLUCOSE 109*  --  166* 98  BUN 27*  --  26* 31*  CREATININE 1.34  < > 1.40* 1.41*  CALCIUM 8.4  --   --  8.5  < > = values in this interval not displayed.  PT/INR:  Recent Labs  05/07/14 1400  LABPROT 16.4*  INR 1.36   ABG    Component Value Date/Time   PHART 7.344* 05/07/2014 2132   HCO3 22.0 05/07/2014 2132   TCO2 20 05/08/2014 1648   ACIDBASEDEF 4.0* 05/07/2014 2132   O2SAT 97.0 05/07/2014 2132   CBG (last 3)   Recent Labs  05/08/14 1919 05/08/14 2325 05/09/14 0336  GLUCAP 184* 144*  103*    Assessment/Plan: S/P Procedure(s) (LRB): CORONARY ARTERY BYPASS GRAFTING (CABG) (N/A) INTRAOPERATIVE TRANSESOPHAGEAL ECHOCARDIOGRAM (N/A)  Hemodynamically stable. Wean off dopamine  Expected acute blood loss anemia: Hgb lower today. Will transfuse a unit since he is very weak. Required 2 physical therapists to stand.  Chronic kidney disease: stable.  Debilitation: continue PT, order OT. Consider rehab eval depending on how he progresses.  Diabetes: glucose under good control. Continue Levemir and SSI.  LOS: 2 days    [name] [name] K 05/09/2014

## 2014-05-09 NOTE — Progress Notes (Signed)
Patient ID: [name] [name], male   DOB: [birth year], 53 y.o.   MRN: [id number] EVENING ROUNDS NOTE :     [address]       [place] [address]             [phone]                 2 Days Post-Op Procedure(s) (LRB): CORONARY ARTERY BYPASS GRAFTING (CABG) (N/A) INTRAOPERATIVE TRANSESOPHAGEAL ECHOCARDIOGRAM (N/A)  Total Length of Stay:  LOS: 2 days  BP 107/55  Pulse 86  Temp(Src) 97.8 F (36.6 C) (Oral)  Resp 19  Ht 6\' 1"  (1.854 m)  Wt 242 lb 4.6 oz (109.9 kg)  BMI 31.97 kg/m2  SpO2 97%  .Intake/Output     06/23 0701 - 06/24 0700 06/24 0701 - 06/25 0700   P.O. 120 480   I.V. (mL/kg) 751.8 (6.8) 189.7 (1.7)   Blood  307.5   IV Piggyback 50    Total Intake(mL/kg) 921.8 (8.4) 977.2 (8.9)   Urine (mL/kg/hr) 1025 (0.4) 1560 (1.2)   Blood     Chest Tube 20 (0)    Total Output 1045 1560   Net -123.2 -582.8          . sodium chloride 20 mL/hr at 05/08/14 0600  . sodium chloride 20 mL/hr at 05/07/14 1800  . sodium chloride    . DOPamine Stopped (05/09/14 1100)  . lactated ringers 20 mL/hr at 05/09/14 1600  . phenylephrine (NEO-SYNEPHRINE) Adult infusion Stopped (05/08/14 1800)     Lab Results  Component Value Date   WBC 9.0 05/09/2014   HGB 7.5* 05/09/2014   HCT 22.1* 05/09/2014   PLT 118* 05/09/2014   GLUCOSE 98 05/09/2014   ALT 12 05/03/2014   AST 14 05/03/2014   NA 139 05/09/2014   K 4.7 05/09/2014   CL 104 05/09/2014   CREATININE 1.41* 05/09/2014   BUN 31* 05/09/2014   CO2 22 05/09/2014   INR 1.36 05/07/2014   HGBA1C 6.5* 05/03/2014   Elevation of cr 1.41 Off pressors Stable day Repeat hct and cr in am  Grace Isaac MD  Beeper [phone] Office [phone] 05/09/2014 6:45 PM

## 2014-05-09 NOTE — Evaluation (Signed)
Physical Therapy Evaluation Patient Details Name: [name] [name] MRN: [id number] DOB: 02-Jun-1961 Today's Date: 05/09/2014   History of Present Illness  The patient is a 53 year old gentleman with a long history of diabetes and hypertension and a family history of premature coronary artery disease who underwent surgery on his left foot in September 2014 for a non-healing wound. 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 Postop he reports having shortness of breath and pulmonary edema leading to a cardiac workup at [facility] with an echo showing a low ejection fraction of 15%. His wife says that he developed renal failure with a creatinine that rose to over 5 and it was felt that he had nephrotoxicity due to an antibiotic. He had a kidney biopsy and was treated with steroids with gradual improvement. He was in the hospital for several weeks and discharged with an external defibrillator. He was treated medically and was found to have an improvement in his EF. The external defibrillator was removed. He had a stress cardiolyte in March 2015 showing a fixed inferior and reversible lateral defect with an EF of 40%. He reports intermittent atypical chest discomfort that he describes as a pain on the outside of his chest with sensitivity of the skin to touch. He denies any chest pressure or heaviness. He has had some exertional dyspnea and has been very fatigued. He has had dizziness and some falls that he thinks is due to the medication he is on; now s/p CABG  Clinical Impression   Pt admitted with above. Pt currently with functional limitations due to the deficits listed below (see PT Problem List).  Pt will benefit from skilled PT to increase their independence and safety with mobility to allow discharge to the venue listed below.       Follow Up Recommendations Supervision/Assistance - 24 hour (Worth considering post-acute rehab for strengthening and to maximize independence and safety with mobility      Equipment Recommendations  Rolling walker with 5" wheels;3in1 (PT)    Recommendations for Other Services OT consult     Precautions / Restrictions Precautions Precautions: Sternal;Fall Required Braces or Orthoses: Other Brace/Splint Other Brace/Splint: WOC RN is recommending off-loading L foot would on plantar surface near 2nd Metatarsal; as he get s up and walks more, we should consider post-op shoe versus ?Darco Restrictions Weight Bearing Restrictions: Yes (sternal precautions)      Mobility  Bed Mobility                  Transfers Overall transfer level: Needs assistance Equipment used: Pushed w/c Transfers: Sit to/from Stand Sit to Stand: +2 physical assistance;Mod assist         General transfer comment: Heavy mod anti-gravity assist for sit to stand; Difficulty fully extending bil knees due to weakness/deconditioning; this was improved with 2 more reps; Decr control of descent with stand to sit  Ambulation/Gait             General Gait Details: Attempted march in place with Bil support on locked wheelchair in front of him, however unable due to weakness and bil knee buckling  Stairs            Wheelchair Mobility    Modified Rankin (Stroke Patients Only)       Balance  Pertinent Vitals/Pain See vitals flow sheet.     Home Living Family/patient expects to be discharged to:: Private residence Living Arrangements: Spouse/significant other;Children Available Help at Discharge: Family;Available 24 hours/day Type of Home: House Home Access: Stairs to enter Entrance Stairs-Rails: Psychiatric nurse of Steps: 5 Home Layout: One level Home Equipment: Walker - 2 wheels      Prior Function Level of Independence: Independent with assistive device(s)         Comments: Rw prn; limited household distance amb     Hand Dominance        Extremity/Trunk  Assessment   Upper Extremity Assessment: Generalized weakness;Defer to OT evaluation           Lower Extremity Assessment: Generalized weakness         Communication   Communication: No difficulties  Cognition Arousal/Alertness: Awake/alert Behavior During Therapy: WFL for tasks assessed/performed Overall Cognitive Status: Within Functional Limits for tasks assessed (though somewhat slow to answer questions)                      General Comments      Exercises        Assessment/Plan    PT Assessment Patient needs continued PT services  PT Diagnosis Difficulty walking;Generalized weakness   PT Problem List Decreased strength;Decreased range of motion;Decreased activity tolerance;Decreased balance;Decreased mobility;Decreased knowledge of use of DME;Decreased cognition;Decreased knowledge of precautions  PT Treatment Interventions DME instruction;Gait training;Stair training;Functional mobility training;Therapeutic activities;Therapeutic exercise;Balance training;Patient/family education   PT Goals (Current goals can be found in the Care Plan section) Acute Rehab PT Goals Patient Stated Goal: did not state PT Goal Formulation: With patient Time For Goal Achievement: 05/23/14 Potential to Achieve Goals: Good    Frequency Min 3X/week   Barriers to discharge        Co-evaluation               End of Session Equipment Utilized During Treatment: Gait belt;Oxygen Activity Tolerance: Patient limited by fatigue;Other (comment) (Nausea) Patient left: in chair;with call bell/phone within reach Nurse Communication: Mobility status         Time: 0820-0849 PT Time Calculation (min): 29 min   Charges:   PT Evaluation $Initial PT Evaluation Tier I: 1 Procedure PT Treatments $Therapeutic Activity: 23-37 mins   PT G Codes:          [name] 05/09/2014, 10:44 AM  [name], PT  Acute Rehabilitation Services Pager [phone] Office  [phone]

## 2014-05-09 NOTE — Progress Notes (Signed)
Rehab Admissions Coordinator Note:  Patient was screened by Cleatrice Burke for appropriateness for an Inpatient Acute Rehab Consult per PT recommendation. At this time, we are recommending await therapy progress, but pt will likely need an inpt rehab consult if he fails to progress to a level to go home. I will follow.Cleatrice Burke 05/09/2014, 11:03 AM  I can be reached at [phone].

## 2014-05-10 ENCOUNTER — Inpatient Hospital Stay (HOSPITAL_COMMUNITY): Payer: BC Managed Care – PPO

## 2014-05-10 LAB — GLUCOSE, CAPILLARY
GLUCOSE-CAPILLARY: 141 mg/dL — AB (ref 70–99)
GLUCOSE-CAPILLARY: 65 mg/dL — AB (ref 70–99)
GLUCOSE-CAPILLARY: 112 mg/dL — AB (ref 70–99)
GLUCOSE-CAPILLARY: 125 mg/dL — AB (ref 70–99)
Glucose-Capillary: 125 mg/dL — ABNORMAL HIGH (ref 70–99)
Glucose-Capillary: 74 mg/dL (ref 70–99)
Glucose-Capillary: 80 mg/dL (ref 70–99)
Glucose-Capillary: 91 mg/dL (ref 70–99)
Glucose-Capillary: 95 mg/dL (ref 70–99)

## 2014-05-10 LAB — BASIC METABOLIC PANEL
BUN: 34 mg/dL — ABNORMAL HIGH (ref 6–23)
CO2: 22 mEq/L (ref 19–32)
Calcium: 7.5 mg/dL — ABNORMAL LOW (ref 8.4–10.5)
Chloride: 103 mEq/L (ref 96–112)
Creatinine, Ser: 1.6 mg/dL — ABNORMAL HIGH (ref 0.50–1.35)
GFR calc Af Amer: 56 mL/min — ABNORMAL LOW (ref >90)
GFR calc non Af Amer: 48 mL/min — ABNORMAL LOW (ref >90)
Glucose, Bld: 85 mg/dL (ref 70–99)
Potassium: 3.8 mEq/L (ref 3.7–5.3)
Sodium: 137 mEq/L (ref 137–147)

## 2014-05-10 LAB — CBC
HCT: 23.1 % — ABNORMAL LOW (ref 39.0–52.0)
Hemoglobin: 7.7 g/dL — ABNORMAL LOW (ref 13.0–17.0)
MCH: 29.4 pg (ref 26.0–34.0)
MCHC: 33.3 g/dL (ref 30.0–36.0)
MCV: 88.2 fL (ref 78.0–100.0)
Platelets: 114 10*3/uL — ABNORMAL LOW (ref 150–400)
RBC: 2.62 MIL/uL — ABNORMAL LOW (ref 4.22–5.81)
RDW: 13.4 % (ref 11.5–15.5)
WBC: 6.6 10*3/uL (ref 4.0–10.5)

## 2014-05-10 LAB — PREPARE RBC (CROSSMATCH)

## 2014-05-10 MED ORDER — FUROSEMIDE 10 MG/ML IJ SOLN
80.0000 mg | Freq: Once | INTRAMUSCULAR | Status: AC
  Administered 2014-05-10: 80 mg via INTRAVENOUS
  Filled 2014-05-10: qty 8

## 2014-05-10 MED ORDER — INSULIN DETEMIR 100 UNIT/ML ~~LOC~~ SOLN
15.0000 [IU] | Freq: Every day | SUBCUTANEOUS | Status: DC
  Administered 2014-05-10: 15 [IU] via SUBCUTANEOUS
  Filled 2014-05-10 (×2): qty 0.15

## 2014-05-10 NOTE — Progress Notes (Signed)
Orthopedic Tech Progress Note Patient Details:  [name] [name] 1961-04-16 [id number] Shoe fit for size and left in room for patient use with PT Ortho Devices Type of Ortho Device: Darco shoe Ortho Device/Splint Location: LLE Ortho Device/Splint Interventions: Application   Asia R Thompson 05/10/2014, 11:11 AM

## 2014-05-10 NOTE — Progress Notes (Signed)
Attempted to get pt out of bed this AM with 3 person assistance, pt unable to stand or hold weight enough to pivot and transfer to chair. Returned pt to bed. VSS

## 2014-05-10 NOTE — Evaluation (Signed)
Occupational Therapy Evaluation Patient Details Name: [name] [name] MRN: [id number] DOB: 28-May-1961 Today's Date: 05/10/2014    History of Present Illness The patient is a 53 year old gentleman with a long history of diabetes and hypertension and a family history of premature coronary artery disease who underwent surgery on his left foot in September 2014 for a non-healing wound. 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 Postop he reports having shortness of breath and pulmonary edema leading to a cardiac workup at [facility] with an echo showing a low ejection fraction of 15%. His wife says that he developed renal failure with a creatinine that rose to over 5 and it was felt that he had nephrotoxicity due to an antibiotic. He had a kidney biopsy and was treated with steroids with gradual improvement. He was in the hospital for several weeks and discharged with an external defibrillator. He was treated medically and was found to have an improvement in his EF. The external defibrillator was removed. He had a stress cardiolyte in March 2015 showing a fixed inferior and reversible lateral defect with an EF of 40%. He reports intermittent atypical chest discomfort that he describes as a pain on the outside of his chest with sensitivity of the skin to touch. He denies any chest pressure or heaviness. He has had some exertional dyspnea and has been very fatigued. He has had dizziness and some falls that he thinks is due to the medication he is on; now s/p CABG   Clinical Impression   Patient is s/p CABG surgery resulting in functional limitations due to the deficits listed below (see OT problem list). Patient will benefit from skilled OT acutely to increase independence and safety with ADLS to allow discharge CIR. Deficits with static sitting, static standing, stand pivot transfer required stedy and sitting in chair leaning strongly LT. Ot to follow acutely for adl retraining, balance and basic transfer     Follow Up Recommendations  CIR    Equipment Recommendations  Other (comment) (defer )    Recommendations for Other Services Rehab consult     Precautions / Restrictions Precautions Precautions: Sternal;Fall Required Braces or Orthoses: Other Brace/Splint Other Brace/Splint: darco shoe present in room for off loading for LT LE wound Restrictions Weight Bearing Restrictions: Yes      Mobility Bed Mobility Overal bed mobility: Needs Assistance;+2 for physical assistance Bed Mobility: Supine to Sit     Supine to sit: +2 for physical assistance;Max assist        Transfers Overall transfer level: Needs assistance Equipment used: 2 person hand held assist (stedy) Transfers: Sit to/from Stand Sit to Stand: +2 physical assistance;Mod assist         General transfer comment: PT is able to power up into standing but upon standing pushing LT. Pt cued to maintain center of gravity and bil LE buckle. pt with bil LE blocked and upright posture and immediate posterior lean    Balance Overall balance assessment: Needs assistance Sitting-balance support: Feet supported Sitting balance-Leahy Scale: Poor   Postural control: Posterior lean;Left lateral lean Standing balance support: Bilateral upper extremity supported;During functional activity Standing balance-Leahy Scale: Zero                              ADL Overall ADL's : Needs assistance/impaired                 Upper Body Dressing : Maximal assistance;Sitting   Lower Body Dressing: +  2 for physical assistance;Maximal assistance;Bed level   Toilet Transfer: +2 for physical assistance;Total assistance (stedy)             General ADL Comments: Pt completed supine <> sit and with mod cues for sternal precautions. pt at EOB unable to maintain static sitting initially. OT sitting next to patient and asked to sit shoulder to shoulder. Pt was able to use this visual and tactile cue to maintain static  sitting. pt with neck flexion and hip fleixon in this position. pt cued for upright posterior and immediate posterior lean. Pt unable to maintain upright posture and Rt LE on floor. pt required use of stedy for safety. pt with strong LT lean     Vision                     Perception Perception Perception Tested?: Yes Perception Deficits: Inattention/neglect Inattention/Neglect: Does not attend to left side of body   Praxis Praxis Praxis tested?: Deficits Deficits: Limb apraxia    Pertinent Vitals/Pain VSS     Hand Dominance Right   Extremity/Trunk Assessment Upper Extremity Assessment Upper Extremity Assessment: Generalized weakness   Lower Extremity Assessment Lower Extremity Assessment: Defer to PT evaluation   Cervical / Trunk Assessment Cervical / Trunk Assessment: Other exceptions Cervical / Trunk Exceptions: forward neck positioning   Communication Communication Communication: No difficulties   Cognition Arousal/Alertness: Awake/alert Behavior During Therapy: WFL for tasks assessed/performed Overall Cognitive Status: Within Functional Limits for tasks assessed                     General Comments       Exercises       Shoulder Instructions      Home Living Family/patient expects to be discharged to:: Private residence Living Arrangements: Spouse/significant other;Children Available Help at Discharge: Family;Available 24 hours/day Type of Home: House Home Access: Stairs to enter [employer] of Steps: 5 Entrance Stairs-Rails: Right;Left Home Layout: One level               Home Equipment: Walker - 2 wheels          Prior Functioning/Environment Level of Independence: Independent with assistive device(s)        Comments: Rw prn; limited household distance amb    OT Diagnosis: Generalized weakness;Acute pain   OT Problem List: Decreased strength;Decreased activity tolerance;Impaired balance (sitting and/or  standing);Decreased safety awareness;Decreased knowledge of use of DME or AE;Decreased knowledge of precautions;Cardiopulmonary status limiting activity;Obesity;Increased edema;Pain   OT Treatment/Interventions: Self-care/ADL training;Therapeutic exercise;Neuromuscular education;DME and/or AE instruction;Therapeutic activities;Cognitive remediation/compensation;Patient/family education;Balance training    OT Goals(Current goals can be found in the care plan section) Acute Rehab OT Goals Patient Stated Goal: to get out of the bed OT Goal Formulation: With patient Time For Goal Achievement: 05/24/14 Potential to Achieve Goals: Good  OT Frequency: Min 2X/week   Barriers to D/C:            Co-evaluation              End of Session Nurse Communication: Mobility status;Precautions;Need for lift equipment  Activity Tolerance: Patient tolerated treatment well Patient left: in chair;with call bell/phone within reach   Time: [id number] OT Time Calculation (min): 30 min Charges:  OT General Charges $OT Visit: 1 Procedure OT Evaluation $Initial OT Evaluation Tier I: 1 Procedure OT Treatments $Self Care/Home Management : 23-37 mins G-Codes:    Peri Maris May 31, 2014, 4:11 PM Pager: [phone]

## 2014-05-10 NOTE — Progress Notes (Signed)
OT Cancellation Note  Patient Details Name: [name] [name] MRN: [id number] DOB: [birth year]   Cancelled Treatment:    Reason Eval/Treat Not Completed: Patient not medically ready (receiving blood at this time. Ot to reattempt)  OT holding AM evaluation and requesting a shoe for Lt foot. Rn aware and Ot to reattempt late afternoon.  Peri Maris Pager: [phone]  05/10/2014, 9:00 AM

## 2014-05-10 NOTE — Progress Notes (Signed)
3 Days Post-Op Procedure(s) (LRB): CORONARY ARTERY BYPASS GRAFTING (CABG) (N/A) INTRAOPERATIVE TRANSESOPHAGEAL ECHOCARDIOGRAM (N/A) Subjective: Feels weak  Objective: Vital signs in last 24 hours: Temp:  [97.4 F (36.3 C)-98.9 F (37.2 C)] 98 F (36.7 C) (06/25 1600) Pulse Rate:  [77-93] 92 (06/25 1400) Cardiac Rhythm:  [-] Normal sinus rhythm (06/25 0600) Resp:  [12-29] 17 (06/25 1400) BP: (87-133)/(47-71) 119/58 mmHg (06/25 1400) SpO2:  [90 %-98 %] 94 % (06/25 1400) Weight:  [107.6 kg (237 lb 3.4 oz)] 107.6 kg (237 lb 3.4 oz) (06/25 0600)  Hemodynamic parameters for last 24 hours:    Intake/Output from previous day: 06/24 0701 - 06/25 0700 In: 1477.2 [P.O.:900; I.V.:269.7; Blood:307.5] Out: 2955 [Urine:2955] Intake/Output this shift: Total I/O In: 277.5 [P.O.:240; Blood:37.5] Out: 135 [Urine:135]  General appearance: alert and cooperative, looks pale and weak. Neurologic: intact Heart: regular rate and rhythm, S1, S2 normal, no murmur, click, rub or gallop Lungs: clear to auscultation bilaterally Extremities: edema mild Wound: incisions ok  Lab Results:  Recent Labs  05/09/14 0500 05/10/14 0400  WBC 9.0 6.6  HGB 7.5* 7.7*  HCT 22.1* 23.1*  PLT 118* 114*   BMET:  Recent Labs  05/09/14 0500 05/10/14 0400  NA 139 137  K 4.7 3.8  CL 104 103  CO2 22 22  GLUCOSE 98 85  BUN 31* 34*  CREATININE 1.41* 1.60*  CALCIUM 8.5 7.5*    PT/INR: No results found for this basename: LABPROT, INR,  in the last 72 hours ABG    Component Value Date/Time   PHART 7.344* 05/07/2014 2132   HCO3 22.0 05/07/2014 2132   TCO2 20 05/08/2014 1648   ACIDBASEDEF 4.0* 05/07/2014 2132   O2SAT 97.0 05/07/2014 2132   CBG (last 3)   Recent Labs  05/10/14 0807 05/10/14 0854 05/10/14 1154  GLUCAP 65* 112* 125*    Assessment/Plan: S/P Procedure(s) (LRB): CORONARY ARTERY BYPASS GRAFTING (CABG) (N/A) INTRAOPERATIVE TRANSESOPHAGEAL ECHOCARDIOGRAM (N/A)  He is hemodynamically  stable.  Expected acute blood loss anemia: Hgb did not increase much after 1 unit prbc yesterday so will transfuse another unit today.  Diabetes: glucose under good control but dropped to 68 last pm and 65 this am. Will decrease Levemir. He is not eating much.  Debilitation: continue PT.  Volume excess:  diurese   LOS: 3 days    BARTLE,BRYAN K 05/10/2014

## 2014-05-11 DIAGNOSIS — R5381 Other malaise: Secondary | ICD-10-CM

## 2014-05-11 LAB — TYPE AND SCREEN
ABO/RH(D): A POS
Antibody Screen: NEGATIVE
UNIT DIVISION: 0
Unit division: 0

## 2014-05-11 LAB — PULMONARY FUNCTION TEST
DL/VA % pred: 78 %
DL/VA: 3.78 ml/min/mmHg/L
DLCO unc % pred: 69 %
DLCO unc: 25.18 ml/min/mmHg
FEF 25-75 Post: 4.77 L/sec
FEF 25-75 Pre: 3.54 L/sec
FEF2575-%Change-Post: 34 %
FEF2575-%PRED-POST: 128 %
FEF2575-%Pred-Pre: 95 %
FEV1-%CHANGE-POST: 12 %
FEV1-%PRED-POST: 90 %
FEV1-%Pred-Pre: 80 %
FEV1-Post: 3.89 L
FEV1-Pre: 3.46 L
FEV1FVC-%CHANGE-POST: 12 %
FEV1FVC-%Pred-Pre: 99 %
FEV6-%Change-Post: 6 %
FEV6-%PRED-PRE: 79 %
FEV6-%Pred-Post: 84 %
FEV6-Post: 4.54 L
FEV6-Pre: 4.27 L
FEV6FVC-%PRED-POST: 103 %
FEV6FVC-%Pred-Pre: 103 %
FVC-%CHANGE-POST: 0 %
FVC-%Pred-Post: 81 %
FVC-%Pred-Pre: 81 %
FVC-PRE: 4.52 L
FVC-Post: 4.54 L
PRE FEV6/FVC RATIO: 100 %
Post FEV1/FVC ratio: 86 %
Post FEV6/FVC ratio: 100 %
Pre FEV1/FVC ratio: 77 %
RV % PRED: 78 %
RV: 1.75 L
TLC % pred: 84 %
TLC: 6.41 L

## 2014-05-11 LAB — CBC
HEMATOCRIT: 25 % — AB (ref 39.0–52.0)
HEMOGLOBIN: 8.5 g/dL — AB (ref 13.0–17.0)
MCH: 30.2 pg (ref 26.0–34.0)
MCHC: 34 g/dL (ref 30.0–36.0)
MCV: 89 fL (ref 78.0–100.0)
Platelets: 176 10*3/uL (ref 150–400)
RBC: 2.81 MIL/uL — ABNORMAL LOW (ref 4.22–5.81)
RDW: 13.3 % (ref 11.5–15.5)
WBC: 7.2 10*3/uL (ref 4.0–10.5)

## 2014-05-11 LAB — BASIC METABOLIC PANEL
BUN: 35 mg/dL — AB (ref 6–23)
CHLORIDE: 99 meq/L (ref 96–112)
CO2: 26 mEq/L (ref 19–32)
Calcium: 8.4 mg/dL (ref 8.4–10.5)
Creatinine, Ser: 1.57 mg/dL — ABNORMAL HIGH (ref 0.50–1.35)
GFR calc non Af Amer: 49 mL/min — ABNORMAL LOW (ref >90)
GFR, EST AFRICAN AMERICAN: 57 mL/min — AB (ref >90)
Glucose, Bld: 70 mg/dL (ref 70–99)
Potassium: 4.1 mEq/L (ref 3.7–5.3)
Sodium: 137 mEq/L (ref 137–147)

## 2014-05-11 LAB — GLUCOSE, CAPILLARY
GLUCOSE-CAPILLARY: 128 mg/dL — AB (ref 70–99)
GLUCOSE-CAPILLARY: 130 mg/dL — AB (ref 70–99)
Glucose-Capillary: 73 mg/dL (ref 70–99)
Glucose-Capillary: 76 mg/dL (ref 70–99)
Glucose-Capillary: 191 mg/dL — ABNORMAL HIGH (ref 70–99)

## 2014-05-11 MED ORDER — INSULIN ASPART 100 UNIT/ML ~~LOC~~ SOLN
0.0000 [IU] | Freq: Three times a day (TID) | SUBCUTANEOUS | Status: DC
  Administered 2014-05-11: 4 [IU] via SUBCUTANEOUS
  Administered 2014-05-12 (×2): 2 [IU] via SUBCUTANEOUS
  Administered 2014-05-12: 4 [IU] via SUBCUTANEOUS
  Administered 2014-05-13 – 2014-05-14 (×4): 2 [IU] via SUBCUTANEOUS
  Administered 2014-05-14 – 2014-05-15 (×2): 4 [IU] via SUBCUTANEOUS

## 2014-05-11 MED ORDER — INSULIN DETEMIR 100 UNIT/ML ~~LOC~~ SOLN
10.0000 [IU] | Freq: Every day | SUBCUTANEOUS | Status: DC
  Administered 2014-05-11 – 2014-05-15 (×5): 10 [IU] via SUBCUTANEOUS
  Filled 2014-05-11 (×5): qty 0.1

## 2014-05-11 NOTE — Progress Notes (Signed)
We will follow up Monday to pursue insurance approval with [employer] for a possible inpt rehab admission.next week when medically ready. Dionne Milo, Admissions Coordinator will follow up on Monday. [id number]

## 2014-05-11 NOTE — Progress Notes (Signed)
Pt could benefit from an inpt rehab consult to assess if pt would be a candidate for admission to inpt rehab before return home. Please place order. [id number]

## 2014-05-11 NOTE — Progress Notes (Signed)
Physical Therapy Treatment Patient Details Name: [name] [name] MRN: [id number] DOB: [birth year] Today's Date: 05/11/2014    History of Present Illness The patient is a 53 year old gentleman with a long history of diabetes and hypertension and a family history of premature coronary artery disease who underwent surgery on his left foot in September 2014 for a non-healing wound. 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 Postop he reports having shortness of breath and pulmonary edema leading to a cardiac workup at [facility] with an echo showing a low ejection fraction of 15%. His wife says that he developed renal failure with a creatinine that rose to over 5 and it was felt that he had nephrotoxicity due to an antibiotic. He had a kidney biopsy and was treated with steroids with gradual improvement. He was in the hospital for several weeks and discharged with an external defibrillator. He was treated medically and was found to have an improvement in his EF. The external defibrillator was removed. He had a stress cardiolyte in March 2015 showing a fixed inferior and reversible lateral defect with an EF of 40%.Now s/p CABG    PT Comments    Pt  Making steady progress. Feel would be good candidate for CIR.  Follow Up Recommendations  CIR     Equipment Recommendations  Rolling walker with 5" wheels;3in1 (PT)    Recommendations for Other Services       Precautions / Restrictions Precautions Precautions: Sternal;Fall Required Braces or Orthoses: Other Brace/Splint Other Brace/Splint: flat darco shoe Restrictions Other Position/Activity Restrictions: offload lt metatarsals as able    Mobility  Bed Mobility Overal bed mobility: Needs Assistance Bed Mobility: Sit to Supine       Sit to supine: +2 for physical assistance;Min assist   General bed mobility comments: Assist to bring legs up and control trunk  Transfers Overall transfer level: Needs assistance Equipment used: Rolling  walker (2 wheeled) Transfers: Sit to/from [employer] Sit to Stand: +2 physical assistance;Mod assist Stand pivot transfers: +2 physical assistance;Mod assist       General transfer comment: Assist needed to bring hips up. Pt used hands on knees for sternal precautions. Pt with lean to the rt. Used Stedy for pivot to bed  Ambulation/Gait Ambulation/Gait assistance: +2 physical assistance;Mod assist Ambulation Distance (Feet): 6 Feet Assistive device: Rolling walker (2 wheeled) Gait Pattern/deviations: Step-through pattern;Decreased step length - right;Decreased step length - left;Narrow base of support;Decreased weight shift to left Gait velocity: decr Gait velocity interpretation: Below normal speed for age/gender General Gait Details: Pt with rt lateral lean. Stepping with rt foot crossing over midline. Verbal/tactile cues to widen base and to stand more erect.   Stairs            Wheelchair Mobility    Modified Rankin (Stroke Patients Only)       Balance   Sitting-balance support: Bilateral upper extremity supported;Feet supported Sitting balance-Leahy Scale: Poor     Standing balance support: Bilateral upper extremity supported Standing balance-Leahy Scale: Poor                      Cognition Arousal/Alertness: Awake/alert Behavior During Therapy: WFL for tasks assessed/performed Overall Cognitive Status: Within Functional Limits for tasks assessed                      Exercises      General Comments        Pertinent Vitals/Pain VSS    Home Living  Prior Function            PT Goals (current goals can now be found in the care plan section) Progress towards PT goals: Progressing toward goals    Frequency  Min 3X/week    PT Plan Discharge plan needs to be updated    Co-evaluation             End of Session Equipment Utilized During Treatment: Gait belt;Oxygen Activity  Tolerance: Patient limited by fatigue Patient left: in bed;with call bell/phone within reach;with family/visitor present;with bed alarm set     Time: 1110-1135 PT Time Calculation (min): 25 min  Charges:  $Gait Training: 23-37 mins                    G Codes:      Annelisa Ryback 2014/06/08, 5:18 PM  [facility] PT [phone]

## 2014-05-11 NOTE — Progress Notes (Signed)
4 Days Post-Op Procedure(s) (LRB): CORONARY ARTERY BYPASS GRAFTING (CABG) (N/A) INTRAOPERATIVE TRANSESOPHAGEAL ECHOCARDIOGRAM (N/A) Subjective:  No complaints  Objective: Vital signs in last 24 hours: Temp:  [98 F (36.7 C)-99 F (37.2 C)] 98.4 F (36.9 C) (06/26 0743) Pulse Rate:  [83-96] 83 (06/26 0800) Cardiac Rhythm:  [-] Normal sinus rhythm (06/26 0728) Resp:  [14-36] 14 (06/26 0800) BP: (99-133)/(58-77) 119/77 mmHg (06/26 0800) SpO2:  [88 %-98 %] 97 % (06/26 0800) Weight:  [106.6 kg (235 lb 0.2 oz)] 106.6 kg (235 lb 0.2 oz) (06/26 0500)  Hemodynamic parameters for last 24 hours:    Intake/Output from previous day: 06/25 0701 - 06/26 0700 In: 757.5 [P.O.:720; Blood:37.5] Out: 4195 [Urine:4195] Intake/Output this shift: Total I/O In: -  Out: 100 [Urine:100]  General appearance: alert and cooperative Neurologic: intact Heart: regular rate and rhythm, S1, S2 normal, no murmur, click, rub or gallop Lungs: few rhonchi bilat Extremities: edema mild Wound: incision ok  Lab Results:  Recent Labs  05/10/14 0400 05/11/14 0345  WBC 6.6 7.2  HGB 7.7* 8.5*  HCT 23.1* 25.0*  PLT 114* 176   BMET:  Recent Labs  05/10/14 0400 05/11/14 0345  NA 137 137  K 3.8 4.1  CL 103 99  CO2 22 26  GLUCOSE 85 70  BUN 34* 35*  CREATININE 1.60* 1.57*  CALCIUM 7.5* 8.4    PT/INR: No results found for this basename: LABPROT, INR,  in the last 72 hours ABG    Component Value Date/Time   PHART 7.344* 05/07/2014 2132   HCO3 22.0 05/07/2014 2132   TCO2 20 05/08/2014 1648   ACIDBASEDEF 4.0* 05/07/2014 2132   O2SAT 97.0 05/07/2014 2132   CBG (last 3)   Recent Labs  05/10/14 2333 05/11/14 0343 05/11/14 0744  GLUCAP 74 73 76    Assessment/Plan: S/P Procedure(s) (LRB): CORONARY ARTERY BYPASS GRAFTING (CABG) (N/A) INTRAOPERATIVE TRANSESOPHAGEAL ECHOCARDIOGRAM (N/A) Mobilize Diuresis Diabetes control: very tight control. Will decrease levemir further to 10units. Hold off  on oral agent until eating better. Plan for transfer to step-down: see transfer orders Continue PT Inpt rehab consult   LOS: 4 days    Gilford Raid K 05/11/2014

## 2014-05-11 NOTE — Consult Note (Signed)
Physical Medicine and Rehabilitation Consult Reason for Consult: Deconditioning after CABG/multi-medical Referring Physician: Dr. Cyndia Bent   HPI: [name] [name] is a 53 y.o. right hand male with history of diabetes mellitus, hypertension. Patient with complicated medical history who underwent surgery on his left foot in September of 2014 for nonhealing wound he reported having MRSA osteomyelitis and multiple toes amputated. Postoperatively shortness of breath pulmonary edema leading to a cardiac [facility]up [facility] echocardiogram showing ejection fraction of 15%. Hospital course at that time complicated by renal failure felt he had nephrotoxicity due to a antibiotic. He was discharged to home he had undergone an external defibrillator fall per cardiology services with the fibulae are later removed. A stress Cardiolite study showed fixed inferior and reversible lateral defect with ejection fraction of 40% in March of 2015. Presented 05/07/2014 with increasing shortness of breath findings of noted multi-vessel coronary artery disease and underwent CABG x4 05/07/2014 per Dr. Cyndia Bent. Postoperative pain management as well as acute blood loss anemia 7.5 with latest hemoglobin 8.5. Wound care nurse for chronic foot wound and had been followed by Dr. Vickki Muff in [place]. Dressing changes advised the left foot and monitored closely. Placed on Lovenox for DVT prophylaxis. Physical and occupational therapy evaluations completed and ongoing recommendations for physical medicine rehabilitation consult.   Review of Systems  Respiratory: Positive for shortness of breath.   Cardiovascular: Positive for leg swelling.  Gastrointestinal: Positive for constipation.  Neurological: Positive for weakness.  All other systems reviewed and are negative.  Past Medical History  Diagnosis Date  . CAD (coronary artery disease)   . Cardiomyopathy   . Chest pain   . Congestive heart failure   .  Diabetes   . Dyspnea   . Hypertension   . Peripheral vascular disease   . Diabetic foot ulcers     left foot 2nd digit ant 5 th digit 05/03/14  . Hypercholesterolemia   . Myocardial infarction   . Chronic kidney disease   . Neuropathy in diabetes    Past Surgical History  Procedure Laterality Date  . Left foot osteomyelitis and wound after surgery    . Tonsilectomy/adenoidectomy with myringotomy    . Cataract extraction    . Toe amputation      Left 3 and 4 toes  . Cardiac catheterization      03/2014  . Coronary artery bypass graft N/A 05/07/2014    Procedure: CORONARY ARTERY BYPASS GRAFTING (CABG);  Surgeon: Gaye Pollack, MD;  Location: [place];  Service: Open Heart Surgery;  Laterality: N/A;  Times 4 using left internal mammary artery and endoscopically harvested right saphenous vein  . Intraoperative transesophageal echocardiogram N/A 05/07/2014    Procedure: INTRAOPERATIVE TRANSESOPHAGEAL ECHOCARDIOGRAM;  Surgeon: Gaye Pollack, MD;  Location: [facility];  Service: Open Heart Surgery;  Laterality: N/A;   Family History  Problem Relation Age of Onset  . Heart disease Father     CABG in his 61's  . Diabetes type II    . Kidney disease    . Hypertension     Social History:  reports that he has never smoked. He has never used smokeless tobacco. He reports that he does not drink alcohol or use illicit drugs. Allergies:  Allergies  Allergen Reactions  . Other     Pt. And wife state that he had a reaction to "some" antibiotic but they do not know what the name of it was.They are are very apprehensive about him  receiving any antibiotic.   Medications Prior to Admission  Medication Sig Dispense Refill  . carvedilol (COREG) 25 MG tablet Take 25 mg by mouth 2 (two) times daily with a meal.       . gabapentin (NEURONTIN) 100 MG capsule Take 100 mg by mouth 2 (two) times daily.       Marland Kitchen glyBURIDE (DIABETA) 5 MG tablet Take 15 mg by mouth daily with breakfast.       . hydrALAZINE  (APRESOLINE) 25 MG tablet Take 12.5 mg by mouth 2 (two) times daily.       . insulin aspart (NOVOLOG FLEXPEN) 100 UNIT/ML FlexPen Inject 4-8 Units into the skin See admin instructions. Sliding scale      . isosorbide mononitrate (IMDUR) 30 MG 24 hr tablet Take 30 mg by mouth at bedtime.         Home: Home Living Family/patient expects to be discharged to:: Private residence Living Arrangements: Spouse/significant other;Children Available Help at Discharge: Family;Available 24 hours/day Type of Home: House Home Access: Stairs to enter [employer] of Steps: 5 Entrance Stairs-Rails: Right;Left Home Layout: One level Home Equipment: Environmental consultant - 2 wheels  Functional History: Prior Function Level of Independence: Independent with assistive device(s) Comments: Rw prn; limited household distance amb Functional Status:  Mobility: Bed Mobility Overal bed mobility: Needs Assistance;+2 for physical assistance Bed Mobility: Supine to Sit Supine to sit: +2 for physical assistance;Max assist Transfers Overall transfer level: Needs assistance Equipment used: 2 person hand held assist (stedy) Transfers: Sit to/from Stand Sit to Stand: +2 physical assistance;Mod assist General transfer comment: PT is able to power up into standing but upon standing pushing LT. Pt cued to maintain center of gravity and bil LE buckle. pt with bil LE blocked and upright posture and immediate posterior lean Ambulation/Gait General Gait Details: Attempted march in place with Bil support on locked wheelchair in front of him, however unable due to weakness and bil knee buckling    ADL: ADL Overall ADL's : Needs assistance/impaired Upper Body Dressing : Maximal assistance;Sitting Lower Body Dressing: +2 for physical assistance;Maximal assistance;Bed level Toilet Transfer: +2 for physical assistance;Total assistance (stedy) General ADL Comments: Pt completed supine <> sit and with mod cues for sternal  precautions. pt at EOB unable to maintain static sitting initially. OT sitting next to patient and asked to sit shoulder to shoulder. Pt was able to use this visual and tactile cue to maintain static sitting. pt with neck flexion and hip fleixon in this position. pt cued for upright posterior and immediate posterior lean. Pt unable to maintain upright posture and Rt LE on floor. pt required use of stedy for safety. pt with strong LT lean  Cognition: Cognition Overall Cognitive Status: Within Functional Limits for tasks assessed Orientation Level: Oriented X4 Cognition Arousal/Alertness: Awake/alert Behavior During Therapy: WFL for tasks assessed/performed Overall Cognitive Status: Within Functional Limits for tasks assessed  Blood pressure 99/75, pulse 81, temperature 98.4 F (36.9 C), temperature source Oral, resp. rate 14, height 6\' 1"  (1.854 m), weight 106.6 kg (235 lb 0.2 oz), SpO2 96.00%. Physical Exam  Vitals reviewed. Constitutional: He is oriented to person, place, and time.  HENT:  Head: Normocephalic.  Eyes: EOM are normal.  Neck: Normal range of motion. Neck supple. No thyromegaly present.  Cardiovascular: Normal rate and regular rhythm.   Respiratory: Effort normal and breath sounds normal. No respiratory distress.  GI: Soft. He exhibits no distension.  Neurological: He is alert and oriented to person, place, and  time.  Follows commands. UE's 4/5 prox to distal. LLE 2/5 HF, KE, foot, RLE 3/5 HF, KE and foot. Decreased PP and LT in both feet and fingers.   Skin:  Chest incision healing nicely. Left foot wound is dressed without odor, chronic scarring, toe amps noted    Results for orders placed during the hospital encounter of 05/07/14 (from the past 24 hour(s))  GLUCOSE, CAPILLARY     Status: Abnormal   Collection Time    05/10/14 11:54 AM      Result Value Ref Range   Glucose-Capillary 125 (*) 70 - 99 mg/dL   Comment 1 Documented in Chart     Comment 2 Notify RN      GLUCOSE, CAPILLARY     Status: None   Collection Time    05/10/14  3:54 PM      Result Value Ref Range   Glucose-Capillary 91  70 - 99 mg/dL   Comment 1 Documented in Chart     Comment 2 Notify RN    GLUCOSE, CAPILLARY     Status: Abnormal   Collection Time    05/10/14  7:41 PM      Result Value Ref Range   Glucose-Capillary 125 (*) 70 - 99 mg/dL   Comment 1 Documented in Chart     Comment 2 Notify RN    GLUCOSE, CAPILLARY     Status: None   Collection Time    05/10/14 11:33 PM      Result Value Ref Range   Glucose-Capillary 74  70 - 99 mg/dL   Comment 1 Documented in Chart     Comment 2 Notify RN    GLUCOSE, CAPILLARY     Status: None   Collection Time    05/11/14  3:43 AM      Result Value Ref Range   Glucose-Capillary 73  70 - 99 mg/dL   Comment 1 Documented in Chart     Comment 2 Notify RN    BASIC METABOLIC PANEL     Status: Abnormal   Collection Time    05/11/14  3:45 AM      Result Value Ref Range   Sodium 137  137 - 147 mEq/L   Potassium 4.1  3.7 - 5.3 mEq/L   Chloride 99  96 - 112 mEq/L   CO2 26  19 - 32 mEq/L   Glucose, Bld 70  70 - 99 mg/dL   BUN 35 (*) 6 - 23 mg/dL   Creatinine, Ser 1.57 (*) 0.50 - 1.35 mg/dL   Calcium 8.4  8.4 - 10.5 mg/dL   GFR calc non Af Amer 49 (*) >90 mL/min   GFR calc Af Amer 57 (*) >90 mL/min  CBC     Status: Abnormal   Collection Time    05/11/14  3:45 AM      Result Value Ref Range   WBC 7.2  4.0 - 10.5 K/uL   RBC 2.81 (*) 4.22 - 5.81 MIL/uL   Hemoglobin 8.5 (*) 13.0 - 17.0 g/dL   HCT 25.0 (*) 39.0 - 52.0 %   MCV 89.0  78.0 - 100.0 fL   MCH 30.2  26.0 - 34.0 pg   MCHC 34.0  30.0 - 36.0 g/dL   RDW 13.3  11.5 - 15.5 %   Platelets 176  150 - 400 K/uL  GLUCOSE, CAPILLARY     Status: None   Collection Time    05/11/14  7:44 AM  Result Value Ref Range   Glucose-Capillary 76  70 - 99 mg/dL   Dg Chest Port 1 View  05/10/2014   CLINICAL DATA:  Status post cardiac surgery  EXAM: PORTABLE CHEST - 1 VIEW  COMPARISON:   05/09/2014  FINDINGS: A right jugular sheath remains in satisfactory position. Postsurgical changes are noted. The cardiac shadow is stable. The overall inspiratory effort is poor. Persistent left basilar atelectasis is noted. No bony abnormality is noted.  IMPRESSION: Persistent left basilar atelectasis.   Electronically Signed   By: Inez Catalina M.D.   On: 05/10/2014 08:11    Assessment/Plan: Diagnosis: deconditioning after CABG and multiple medical.  1. Does the need for close, 24 hr/day medical supervision in concert with the patient's rehab needs make it unreasonable for this patient to be served in a less intensive setting? Yes 2. Co-Morbidities requiring supervision/potential complications: dm with DPN, toe amps left foot/wound 3. Due to bladder management, bowel management, safety, skin/wound care, disease management, medication administration, pain management and patient education, does the patient require 24 hr/day rehab nursing? Yes 4. Does the patient require coordinated care of a physician, rehab nurse, PT (1-2 hrs/day, 5 days/week) and OT (1-2 hrs/day, 5 days/week) to address physical and functional deficits in the context of the above medical diagnosis(es)? Yes Addressing deficits in the following areas: balance, endurance, locomotion, strength, transferring, bowel/bladder control, bathing, dressing, feeding, grooming, toileting and psychosocial support 5. Can the patient actively participate in an intensive therapy program of at least 3 hrs of therapy per day at least 5 days per week? Yes 6. The potential for patient to make measurable gains while on inpatient rehab is excellent 7. Anticipated functional outcomes upon discharge from inpatient rehab are modified independent and supervision  with PT, modified independent and supervision with OT, n/a with SLP. 8. Estimated rehab length of stay to reach the above functional goals is: 10-14 days 9. Does the patient have adequate social  supports to accommodate these discharge functional goals? Yes 10. Anticipated D/C setting: Home 11. Anticipated post D/C treatments: White therapy 12. Overall Rehab/Functional Prognosis: excellent  RECOMMENDATIONS: This patient's condition is appropriate for continued rehabilitative care in the following setting: CIR Patient has agreed to participate in recommended program. Yes Note that insurance prior authorization may be required for reimbursement for recommended care.  Comment: Rehab Admissions Coordinator to follow up.  Thanks,  Meredith Staggers, MD, Mellody Drown     05/11/2014

## 2014-05-11 NOTE — Progress Notes (Signed)
Patient transferred to 2W-35, hand off completed with 2W staff.

## 2014-05-12 ENCOUNTER — Inpatient Hospital Stay (HOSPITAL_COMMUNITY): Payer: BC Managed Care – PPO

## 2014-05-12 LAB — BASIC METABOLIC PANEL
BUN: 30 mg/dL — AB (ref 6–23)
CHLORIDE: 102 meq/L (ref 96–112)
CO2: 27 mEq/L (ref 19–32)
CREATININE: 1.24 mg/dL (ref 0.50–1.35)
Calcium: 8.8 mg/dL (ref 8.4–10.5)
GFR calc Af Amer: 76 mL/min — ABNORMAL LOW (ref >90)
GFR, EST NON AFRICAN AMERICAN: 66 mL/min — AB (ref >90)
Glucose, Bld: 134 mg/dL — ABNORMAL HIGH (ref 70–99)
Potassium: 5.1 mEq/L (ref 3.7–5.3)
Sodium: 141 mEq/L (ref 137–147)

## 2014-05-12 LAB — CBC
HEMATOCRIT: 28.5 % — AB (ref 39.0–52.0)
Hemoglobin: 9.3 g/dL — ABNORMAL LOW (ref 13.0–17.0)
MCH: 30 pg (ref 26.0–34.0)
MCHC: 32.6 g/dL (ref 30.0–36.0)
MCV: 91.9 fL (ref 78.0–100.0)
Platelets: 231 10*3/uL (ref 150–400)
RBC: 3.1 MIL/uL — ABNORMAL LOW (ref 4.22–5.81)
RDW: 13 % (ref 11.5–15.5)
WBC: 7.2 10*3/uL (ref 4.0–10.5)

## 2014-05-12 LAB — GLUCOSE, CAPILLARY
GLUCOSE-CAPILLARY: 128 mg/dL — AB (ref 70–99)
GLUCOSE-CAPILLARY: 151 mg/dL — AB (ref 70–99)
Glucose-Capillary: 156 mg/dL — ABNORMAL HIGH (ref 70–99)

## 2014-05-12 MED ORDER — MOVING RIGHT ALONG BOOK
Freq: Once | Status: AC
  Administered 2014-05-12: 08:00:00
  Filled 2014-05-12: qty 1

## 2014-05-12 MED ORDER — FUROSEMIDE 40 MG PO TABS
40.0000 mg | ORAL_TABLET | Freq: Every day | ORAL | Status: DC
  Administered 2014-05-12 – 2014-05-13 (×2): 40 mg via ORAL
  Filled 2014-05-12 (×3): qty 1

## 2014-05-12 MED ORDER — LACTULOSE 10 GM/15ML PO SOLN
10.0000 g | Freq: Once | ORAL | Status: AC
  Administered 2014-05-12: 10 g via ORAL
  Filled 2014-05-12: qty 15

## 2014-05-12 MED ORDER — SODIUM CHLORIDE 0.9 % IJ SOLN
3.0000 mL | Freq: Two times a day (BID) | INTRAMUSCULAR | Status: DC
  Administered 2014-05-12 – 2014-05-15 (×7): 3 mL via INTRAVENOUS

## 2014-05-12 MED ORDER — TRAMADOL HCL 50 MG PO TABS: 50.0000 mg | ORAL_TABLET | ORAL | Status: DC | PRN

## 2014-05-12 MED ORDER — ACETAMINOPHEN 325 MG PO TABS
650.0000 mg | ORAL_TABLET | Freq: Four times a day (QID) | ORAL | Status: DC | PRN
  Administered 2014-05-12: 650 mg via ORAL
  Filled 2014-05-12: qty 2

## 2014-05-12 MED ORDER — SODIUM CHLORIDE 0.9 % IJ SOLN: 3.0000 mL | INTRAMUSCULAR | Status: DC | PRN

## 2014-05-12 MED ORDER — BISACODYL 5 MG PO TBEC
10.0000 mg | DELAYED_RELEASE_TABLET | Freq: Every day | ORAL | Status: DC | PRN
  Administered 2014-05-15: 10 mg via ORAL
  Filled 2014-05-12: qty 2

## 2014-05-12 MED ORDER — DOCUSATE SODIUM 100 MG PO CAPS
200.0000 mg | ORAL_CAPSULE | Freq: Every day | ORAL | Status: DC
  Administered 2014-05-12 – 2014-05-15 (×4): 200 mg via ORAL
  Filled 2014-05-12 (×3): qty 2

## 2014-05-12 MED ORDER — TAMSULOSIN HCL 0.4 MG PO CAPS
0.4000 mg | ORAL_CAPSULE | Freq: Every day | ORAL | Status: DC
  Administered 2014-05-12 – 2014-05-15 (×4): 0.4 mg via ORAL
  Filled 2014-05-12 (×4): qty 1

## 2014-05-12 MED ORDER — PANTOPRAZOLE SODIUM 40 MG PO TBEC
40.0000 mg | DELAYED_RELEASE_TABLET | Freq: Every day | ORAL | Status: DC
  Administered 2014-05-12 – 2014-05-15 (×4): 40 mg via ORAL
  Filled 2014-05-12 (×5): qty 1

## 2014-05-12 MED ORDER — SODIUM CHLORIDE 0.9 % IV SOLN
250.0000 mL | INTRAVENOUS | Status: DC | PRN
Start: 2014-05-12 — End: 2014-05-15

## 2014-05-12 MED ORDER — ASPIRIN EC 325 MG PO TBEC
325.0000 mg | DELAYED_RELEASE_TABLET | Freq: Every day | ORAL | Status: DC
  Administered 2014-05-12 – 2014-05-15 (×4): 325 mg via ORAL
  Filled 2014-05-12 (×4): qty 1

## 2014-05-12 MED ORDER — BISACODYL 10 MG RE SUPP
10.0000 mg | Freq: Every day | RECTAL | Status: DC | PRN
  Administered 2014-05-12: 10 mg via RECTAL
  Filled 2014-05-12: qty 1

## 2014-05-12 MED ORDER — POTASSIUM CHLORIDE CRYS ER 20 MEQ PO TBCR
20.0000 meq | EXTENDED_RELEASE_TABLET | Freq: Every day | ORAL | Status: DC
  Administered 2014-05-13: 20 meq via ORAL
  Filled 2014-05-12 (×3): qty 1

## 2014-05-12 MED ORDER — OXYCODONE HCL 5 MG PO TABS
5.0000 mg | ORAL_TABLET | ORAL | Status: DC | PRN
  Administered 2014-05-12 – 2014-05-15 (×6): 10 mg via ORAL
  Filled 2014-05-12 (×7): qty 2

## 2014-05-12 MED ORDER — METOPROLOL TARTRATE 12.5 MG HALF TABLET
12.5000 mg | ORAL_TABLET | Freq: Two times a day (BID) | ORAL | Status: DC
  Administered 2014-05-12 – 2014-05-13 (×4): 12.5 mg via ORAL
  Filled 2014-05-12 (×6): qty 1

## 2014-05-12 NOTE — Progress Notes (Signed)
Intermittent catheterization performed with 1275 ml. dark yellow urine resulting.  No resistance noted and patient tolerated procedure well.  Pt. had had no urine output since prior catheterization at 1800h the previous day.

## 2014-05-12 NOTE — Progress Notes (Addendum)
      [address]       [place] [address]             [phone]      5 Days Post-Op Procedure(s) (LRB): CORONARY ARTERY BYPASS GRAFTING (CABG) (N/A) INTRAOPERATIVE TRANSESOPHAGEAL ECHOCARDIOGRAM (N/A)  Subjective:  [name] complains of inability to urinate.  He states he has the urge to go, but is unable.  He required I/O cath again this morning which revealed 1275 ml of urine present ( has been done 3x).  He is not ambulating much due to ulceration on his left foot which is chronic.  He has been evaluated by CIR for placement at discharge  Objective: Vital signs in last 24 hours: Temp:  [98.1 F (36.7 C)-98.4 F (36.9 C)] 98.2 F (36.8 C) (06/27 0503) Pulse Rate:  [81-89] 83 (06/27 0503) Cardiac Rhythm:  [-]  Resp:  [11-18] 18 (06/27 0503) BP: (99-152)/(63-79) 128/74 mmHg (06/27 0503) SpO2:  [94 %-100 %] 98 % (06/27 0503) Weight:  [237 lb 3.4 oz (107.6 kg)] 237 lb 3.4 oz (107.6 kg) (06/27 0503)  Intake/Output from previous day: 06/26 0701 - 06/27 0700 In: 720 [P.O.:720] Out: 660 [Urine:660] Intake/Output this shift: Total I/O In: -  Out: 1275 [Urine:1275]  General appearance: alert, cooperative and no distress Heart: regular rate and rhythm Lungs: clear to auscultation bilaterally Abdomen: soft, non-tender; bowel sounds normal; no masses,  no organomegaly Extremities: edema 1+ Wound: clean and dry  Lab Results:  Recent Labs  05/10/14 0400 05/11/14 0345  WBC 6.6 7.2  HGB 7.7* 8.5*  HCT 23.1* 25.0*  PLT 114* 176   BMET:  Recent Labs  05/10/14 0400 05/11/14 0345  NA 137 137  K 3.8 4.1  CL 103 99  CO2 22 26  GLUCOSE 85 70  BUN 34* 35*  CREATININE 1.60* 1.57*  CALCIUM 7.5* 8.4    PT/INR: No results found for this basename: LABPROT, INR,  in the last 72 hours ABG    Component Value Date/Time   PHART 7.344* 05/07/2014 2132   HCO3 22.0 05/07/2014 2132   TCO2 20 05/08/2014 1648   ACIDBASEDEF 4.0* 05/07/2014 2132   O2SAT 97.0  05/07/2014 2132   CBG (last 3)   Recent Labs  05/11/14 1540 05/11/14 2128 05/12/14 0642  GLUCAP 130* 191* 128*    Assessment/Plan: S/P Procedure(s) (LRB): CORONARY ARTERY BYPASS GRAFTING (CABG) (N/A) INTRAOPERATIVE TRANSESOPHAGEAL ECHOCARDIOGRAM (N/A)  1. CV- NSR, good rate control, pressure was elevated at 150 this AM, will monitor- continue Lopressor, may require blood pressure agent 2. Pulm- off oxygen, no acute issues, continue IS 3. Renal- creatinine has been elevated, + hypervolemia, will continue Lasix 4. GU- + urinary retention, will start Flomax, patient with knonw Peyronies disease, may require placement of foley 5. DM- sugars improving, continue current regimen 6. Left foot ulceration, chronic, continue wound care 7. Deconditioning- possible CIR placement, awaiting insurance auth 8. Dispo- patient stable, will d/c EPW, need to monitor urinary retention, flomax ordered to start this morning, continue current care   LOS: 5 days    Ellwood Handler 05/12/2014  Agree with plans to start Flomax and to leave Foley in place for least 48 hours.  patient examined and medical record reviewed,agree with above note. VAN TRIGT III,PETER 05/12/2014

## 2014-05-12 NOTE — Progress Notes (Signed)
Patient bladder scan showing 327 cc.  I&O cath attempted by RN and Nurse Tech. Attempts were unsuccessful due to meeting resistance.  Patient is continuing to void.  RN will continue to monitor patient.

## 2014-05-12 NOTE — Progress Notes (Signed)
Patient unable to void.  #16 Fr. Foley inserted without difficulty with 1000 ml clear yellow urine return.  Patient tolerated procedure well.

## 2014-05-12 NOTE — Progress Notes (Addendum)
CARDIAC REHAB PHASE I   PRE:  Rate/Rhythm: 89 sinus rhythm  BP:  Supine:   Sitting: 143/70  Standing:    SaO2: 98% ra  MODE:  Ambulation: 100 ft   POST:  Rate/Rhythem: 98 sinus rhythm  BP:  Supine:   Sitting: 142/78  Standing:    SaO2: 95% ra  1000-1022  Pt ambulated in hallway x 2 assist using rolling walker and boot.  Gait belt used.   `C/o mild dizziness.  Somewhat unsteady gait.  Pt returned to chair, call light in reach.     Rion, [place]

## 2014-05-13 DIAGNOSIS — Z951 Presence of aortocoronary bypass graft: Secondary | ICD-10-CM

## 2014-05-13 LAB — CBC
HCT: 26.5 % — ABNORMAL LOW (ref 39.0–52.0)
Hemoglobin: 8.5 g/dL — ABNORMAL LOW (ref 13.0–17.0)
MCH: 28.7 pg (ref 26.0–34.0)
MCHC: 32.1 g/dL (ref 30.0–36.0)
MCV: 89.5 fL (ref 78.0–100.0)
Platelets: 267 10*3/uL (ref 150–400)
RBC: 2.96 MIL/uL — ABNORMAL LOW (ref 4.22–5.81)
RDW: 12.9 % (ref 11.5–15.5)
WBC: 6.4 10*3/uL (ref 4.0–10.5)

## 2014-05-13 LAB — GLUCOSE, CAPILLARY
GLUCOSE-CAPILLARY: 112 mg/dL — AB (ref 70–99)
GLUCOSE-CAPILLARY: 174 mg/dL — AB (ref 70–99)
GLUCOSE-CAPILLARY: 154 mg/dL — AB (ref 70–99)
Glucose-Capillary: 127 mg/dL — ABNORMAL HIGH (ref 70–99)

## 2014-05-13 LAB — BASIC METABOLIC PANEL
BUN: 31 mg/dL — ABNORMAL HIGH (ref 6–23)
CO2: 27 mEq/L (ref 19–32)
Calcium: 8.8 mg/dL (ref 8.4–10.5)
Chloride: 100 mEq/L (ref 96–112)
Creatinine, Ser: 1.34 mg/dL (ref 0.50–1.35)
GFR calc Af Amer: 69 mL/min — ABNORMAL LOW (ref >90)
GFR calc non Af Amer: 60 mL/min — ABNORMAL LOW (ref >90)
Glucose, Bld: 121 mg/dL — ABNORMAL HIGH (ref 70–99)
Potassium: 4.6 mEq/L (ref 3.7–5.3)
Sodium: 141 mEq/L (ref 137–147)

## 2014-05-13 MED ORDER — ATORVASTATIN CALCIUM 40 MG PO TABS
40.0000 mg | ORAL_TABLET | Freq: Every day | ORAL | Status: DC
  Administered 2014-05-13 – 2014-05-14 (×2): 40 mg via ORAL
  Filled 2014-05-13 (×3): qty 1

## 2014-05-13 NOTE — Progress Notes (Addendum)
      [address]       [place] [address]             [phone]      6 Days Post-Op Procedure(s) (LRB): CORONARY ARTERY BYPASS GRAFTING (CABG) (N/A) INTRAOPERATIVE TRANSESOPHAGEAL ECHOCARDIOGRAM (N/A)  Subjective:  [name] [name] has no new complaints this morning.  He was unable to void yesterday afternoon and foley was reinserted.  He is ambulating some, but it is difficult with his chronic foot ulceration on his left foot. + BM  Objective: Vital signs in last 24 hours: Temp:  [98.5 F (36.9 C)-98.7 F (37.1 C)] 98.7 F (37.1 C) (06/28 0413) Pulse Rate:  [83-88] 83 (06/28 0413) Cardiac Rhythm:  [-] Normal sinus rhythm (06/27 2023) Resp:  [17-19] 17 (06/28 0413) BP: (97-114)/(65-76) 97/65 mmHg (06/28 0413) SpO2:  [93 %-97 %] 93 % (06/28 0413) Weight:  [231 lb 4.8 oz (104.917 kg)] 231 lb 4.8 oz (104.917 kg) (06/28 0413)  Intake/Output from previous day: 06/27 0701 - 06/28 0700 In: 1340 [P.O.:1340] Out: 3650 [Urine:3650]  General appearance: alert, cooperative and no distress Heart: regular rate and rhythm Lungs: clear to auscultation bilaterally Abdomen: soft, non-tender; bowel sounds normal; no masses,  no organomegaly Extremities: edema trace Wound: clean and dry  Lab Results:  Recent Labs  05/12/14 0805 05/13/14 0445  WBC 7.2 6.4  HGB 9.3* 8.5*  HCT 28.5* 26.5*  PLT 231 267   BMET:  Recent Labs  05/12/14 0805 05/13/14 0445  NA 141 141  K 5.1 4.6  CL 102 100  CO2 27 27  GLUCOSE 134* 121*  BUN 30* 31*  CREATININE 1.24 1.34  CALCIUM 8.8 8.8    PT/INR: No results found for this basename: LABPROT, INR,  in the last 72 hours ABG    Component Value Date/Time   PHART 7.344* 05/07/2014 2132   HCO3 22.0 05/07/2014 2132   TCO2 20 05/08/2014 1648   ACIDBASEDEF 4.0* 05/07/2014 2132   O2SAT 97.0 05/07/2014 2132   CBG (last 3)   Recent Labs  05/12/14 1620 05/12/14 2125 05/13/14 0605  GLUCAP 156* 151* 112*    Assessment/Plan: S/P  Procedure(s) (LRB): CORONARY ARTERY BYPASS GRAFTING (CABG) (N/A) INTRAOPERATIVE TRANSESOPHAGEAL ECHOCARDIOGRAM (N/A)  1. CV- NSR, pressure improved-continue Lopressor 2. Pulm- off oxygen, continue IS 3. Renal- creatinine stable, volume status improving, continue lasix 4. GU- Urinary retention, foley in place, continue Flomax 5. DM- sugars are controlled, continue current regimen 6. Left foot ulceration chronic, continue wound care 7. Deconditioning- possible CIR placement, will attempt insurance authorization on Monday 8. Dispo- patient stable, leave foley in place for at least 48 hours, CIR pending   LOS: 6 days    [name] [name] 05/13/2014  The patient continues to have several non-cardiac issues and would benefit from postop inpatient rehabilitation which is pending approval for Monday  patient examined and medical record reviewed,agree with above note. [name] [name],[name] [name] 05/13/2014

## 2014-05-13 NOTE — Progress Notes (Signed)
Pt ambulated in hall with front wheel rolling walker. Pt tolerated well and ambulated 263ft.

## 2014-05-13 NOTE — Discharge Summary (Signed)
Physician Discharge Summary  Patient ID: [name] [name] MRN: [id number] DOB/AGE: Dec 21, 1960 53 y.o.  Admit date: 05/07/2014 Discharge date: 05/15/2014  Admission Diagnoses:  Patient Active Problem List   Diagnosis Date Noted  . S/P CABG x 4 05/13/2014  . Type II or unspecified type diabetes mellitus without mention of complication, uncontrolled 05/08/2014  . CAD (coronary artery disease)    Discharge Diagnoses:   Patient Active Problem List   Diagnosis Date Noted  . S/P CABG x 4 05/13/2014  . Type II or unspecified type diabetes mellitus without mention of complication, uncontrolled 05/08/2014  . CAD (coronary artery disease)    Discharged Condition: good  History of Present Illness:     [name] [name] is a 53 yo white male with long standing history of diabetes, hypertension, and known family history of CAD.  The patient has had a long standing left ulceration on his foot, which he underwent surgery for MRSA Osteomyelitis.  The patient developed post operative shortness of breath and pulmonary edema.  Workup was started at [facility] with an Echocardiogram which showed a low ejection fraction of 15%.  Also post operatively the patient developed renal failure with creatinine rising over to 5 which was felt to be due to Antibiotics.  He underwent kidney biopsy and was treated with steroids with gradual improvement.  He was hospitalized for several weeks and was later discharged with an external defibrillator.  He was initially medically treated with improvement of her EF and he no longer had to use the external defibrillator.  He underwent Cardiolite stress test in March which showed a fixed inferior and reversible and lateral defect.  The patient was also experiencing chest discomfort, exertional dyspnea, and fatigue.  It was felt he should undergo cardiac catheterization which showed severe multi-vessel CAD.  It was felt Coronary Bypass Grafting procedure would be the best treatment option for him.   He was subsequently referred to [facility] for surgical evaluation.  He was evaluated by Dr. Cyndia Bent who was in agreement the patient would benefit from CABG procedure.  The risks and benefits of the procedure were explained to the patient and he was agreeable to proceed.  Hospital Course:   The patient presented to [facility] on 05/07/2014.  He was taken to the operating room and underwent CABG x 4 utilizing LIMA to LAD, SVG to Diagonal, SVG to OM, and SVG to PDA.  He also underwent Endoscopic saphenous vein harvest from his right leg.  He tolerated the procedure well and was taken to the SICU in stable condition.  The patient was extubated the evening of surgery.  During his stay in the SICU the patient was weaned off Dopamine and Neo Synephrine as tolerated.  He was weaned off insulin drip as tolerated and his sugars were tightly maintained post operatively.  His chest tubes and arterial lines were removed without difficulty.  Wound care consult was obtained for his chronic left foot ulceration and recommended dressing changes.  He was transfused a unit of packed cells for expected blood loss anemia.  The patients mobility was limited due to his foot ulceration.  Therefore PT/OT consult was obtained and felt the patient would benefit from CIR placement.  Rehab consult was obtained and felt patient would benefit from inpatient rehab pending insurance authorization.  He was felt to be medically stable and was transferred to the step down unit in stable condition.    The patient developed urinary retention.  He was started on Flomax and  a foley catheter had to be re-inserted.  Foley catheter was later removed 06/30. Unfortunately, he has not voided on his own yet. Bladder scan showed 230 cc. He will likely need the foley re inserted. He has a history of Peyronie's disease. He will need to follow up with a urologist. He is maintaining NSR and his pacing wires have been removed without difficulty.  He his  hypervolemic and has been responding well with Lasix.  He is ambulating as he can, but will ultimately require additional physical therapy.  His diabetes remains well controlled.  He is tolerating a carb modified diet.  His creatinine did increase to 1.55. Nephrotoxic agents were avoided. His creatinine was elevated prior to surgery at 1.62. Creatinine today is down to 1.42. He likely has some underlying minor renal insufficiency. Regarding his diabetes management, he was on glyburide and Insulin pre op. Glyburide has not been restarted since his creatinine was elevated. We will restart this in about one week. H ewill close follow up with his medical doctor. Chest tube sutures need to be removed in the hospital on 05/20/2014. Regarding left foot wound, please apply silver hydrofiber packing into the open area while hospitalized, cover with dry dressing. Change 3x each week. Off load if possible. Follow up with Dr. Vickki Muff once recovered from current surgery. Patient is felt surgically stable for discharge to CIR.      Consults: rehabilitation medicine and wound care  Treatments: surgery:   1. Median Sternotomy 2. Extracorporeal circulation 3. Coronary artery bypass grafting x 4  Left internal mammary graft to the LAD  SVG to Diagonal  SVG to OM  SVG to PDA 4. Endoscopic vein harvest from the right leg   Disposition: CIR  Discharge Medications:    Medication List    STOP taking these medications       hydrALAZINE 25 MG tablet  Commonly known as:  APRESOLINE     isosorbide mononitrate 30 MG 24 hr tablet  Commonly known as:  IMDUR      TAKE these medications       aspirin 325 MG EC tablet  Take 1 tablet (325 mg total) by mouth daily.     atorvastatin 40 MG tablet  Commonly known as:  LIPITOR  Take 1 tablet (40 mg total) by mouth daily at 6 PM.     carvedilol 3.125 MG tablet  Commonly known as:  COREG  Take 1 tablet (3.125 mg total) by mouth 2 (two) times daily with a meal.      gabapentin 100 MG capsule  Commonly known as:  NEURONTIN  Take 100 mg by mouth 2 (two) times daily.     glyBURIDE 5 MG tablet  Commonly known as:  DIABETA  Take 3 tablets (15 mg total) by mouth daily with breakfast.  Start taking on:  05/21/2014     NOVOLOG FLEXPEN 100 UNIT/ML FlexPen  Generic drug:  insulin aspart  Inject 4-8 Units into the skin See admin instructions. Sliding scale     oxyCODONE 5 MG immediate release tablet  Commonly known as:  Oxy IR/ROXICODONE  Take 1-2 tablets (5-10 mg total) by mouth every 4 (four) hours as needed for severe pain.     pantoprazole 40 MG tablet  Commonly known as:  PROTONIX  Take 1 tablet (40 mg total) by mouth daily before breakfast. May stop when discharged from CIR.     tamsulosin 0.4 MG Caps capsule  Commonly known as:  FLOMAX  Take 1  capsule (0.4 mg total) by mouth daily.       The patient has been discharged on:   1.Beta Blocker:  Yes [ x  ]                              No   [   ]                              If No, reason:  2.Ace Inhibitor/ARB: Yes [   ]                                     No  [  x  ]                                     If No, reason: Elevated creatinine, labile blood pressure  3.Statin:   Yes [ x  ]                  No  [   ]                  If No, reason:  4.Ecasa:  Yes  [x ]                  No   [   ]                  If No, reason:      Follow-up Information   Follow up with Gaye Pollack, MD On 06/06/2014. (Appointment time is at 11:30 am)    Specialty:  Cardiothoracic Surgery   Contact information:   [address] [place] [address] [phone]       Follow up with [facility] On 06/06/2014. (Please obtain a PA/LAT CXR (at [place] which is in the same building as Dr. Vivi Martens office) at 10:30 am on this date)    Contact information:   [place]       Follow up with [facility] MD. (Please contact office to set up follow up appointment)    Specialty:   Cardiology   Contact information:   [place] [address] [phone]       Follow up with Nurse On 05/20/2014. (Please remove chest tube sutures on 05/20/2014)       Signed: ZIMMERMAN,DONIELLE M PA-C 05/15/2014, 10:46 AM

## 2014-05-13 NOTE — Discharge Instructions (Signed)
Coronary Artery Bypass Grafting, Care After °Refer to this sheet in the next few weeks. These instructions provide you with information on caring for yourself after your procedure. Your health care provider may also give you more specific instructions. Your treatment has been planned according to current medical practices, but problems sometimes occur. Call your health care provider if you have any problems or questions after your procedure. °WHAT TO EXPECT AFTER THE PROCEDURE °Recovery from surgery will be different for everyone. Some people feel well after 3 or 4 weeks, while for others it takes longer. After your procedure, it is typical to have the following: °· Nausea and a lack of appetite.   °· Constipation. °· Weakness and fatigue.   °· Depression or irritability.   °· Pain or discomfort at your incision site. °HOME CARE INSTRUCTIONS °· Take all medicines as directed by your health care provider. Do not stop taking medicines or start any new medicines without first checking with your health care provider. °· Take your pulse as directed by your health care provider. °· Perform deep breathing as directed by your health care provider. If you were given a device called an incentive spirometer, use it to practice deep breathing several times a day. Support your chest with a pillow or your arms when you take deep breaths or cough. °· Keep incision areas clean, dry, and protected. Remove or change any bandages (dressings) only as directed by your health care provider. You may have skin adhesive strips over the incision areas. Do not take the strips off. They will fall off on their own. °· Check incision areas daily for any swelling, redness, or drainage. °· If incisions were made in your legs, do the following: °¨ Avoid crossing your legs.   °¨ Avoid sitting for long periods of time. Change positions every 30 minutes.   °¨ Elevate your legs when you are sitting. °· Wear compression stockings as directed by your  health care provider. These stockings help keep blood clots from forming in your legs. °· Take showers once your health care provider approves. Until then, only take sponge baths. Pat incisions dry. Do not rub incisions with a washcloth or towel. Do not bathe, swim, or use a hot tub until directed by your health care provider. °· Eat foods that are high in fiber, such as raw fruits and vegetables, whole grains, beans, and nuts. Meats should be lean cut. Avoid canned, processed, and fried foods. °· Drink enough fluids to keep your urine clear or pale yellow. °· Weigh yourself every day. This helps identify if you are retaining fluid that may make your heart and lungs work harder. °· Rest and limit activity as directed by your health care provider. You may be instructed to: °¨ Stop any activity at once if you have chest pain, shortness of breath, irregular heartbeats, or dizziness. Get help right away if you have any of these symptoms. °¨ Move around frequently for short periods or take short walks as directed by your health care provider. Increase your activities gradually. You may need physical therapy or cardiac rehabilitation to help strengthen your muscles and build your endurance. °¨ Avoid lifting, pushing, or pulling anything heavier than 10 lb (4.5 kg) for at least 6 weeks after surgery. °· Do not drive until your health care provider approves.  °· Ask your health care provider when you may return to work. °· Ask your health care provider when you may resume sexual activity. °· Follow up with your health care provider as directed. °SEEK   MEDICAL CARE IF: °· You have swelling, redness, increasing pain, or drainage at the site of an incision. °· You have a fever. °· You have swelling in your ankles or legs. °· You have pain in your legs.   °· You gain 2 or more pounds (0.9 kg) a day. °· You are nauseous or vomit. °· You have diarrhea.  °SEEK IMMEDIATE MEDICAL CARE IF: °· You have chest pain that goes to your jaw  or arms. °· You have shortness of breath.   °· You have a fast or irregular heartbeat.   °· You notice a "clicking" in your breastbone (sternum) when you move.   °· You have numbness or weakness in your arms or legs. °· You feel dizzy or light-headed.   °MAKE SURE YOU: °· Understand these instructions. °· Will watch your condition. °· Will get help right away if you are not doing well or get worse. °Document Released: 05/22/2005 Document Revised: 11/07/2013 Document Reviewed: 04/11/2013 °ExitCare® Patient Information ©2015 [employer]. This information is not intended to replace advice given to you by your health care provider. Make sure you discuss any questions you have with your health care provider. ° °Endoscopic Saphenous Vein Harvesting °Care After °Refer to this sheet in the next few weeks. These instructions provide you with information on caring for yourself after your procedure. Your health care provider may also give you more specific instructions. Your treatment has been planned according to current medical practices, but problems sometimes occur. Call your health care provider if you have any problems or questions after your procedure. °HOME CARE INSTRUCTIONS °Medicine °· Take whatever pain medicine your surgeon prescribes. Follow the directions carefully. Do not take over-the-counter pain medicine unless your surgeon says it is okay. Some pain medicine can cause bleeding problems for several weeks after surgery. °· Follow your surgeon's instructions about driving. You will probably not be permitted to drive after heart surgery. °· Take any medicines your surgeon prescribes. Any medicines you took before your heart surgery should be checked with your health care provider before you start taking them again. °Wound care °· If your surgeon has prescribed an elastic bandage or stocking, ask how long you should wear it. °· Check the area around your surgical cuts (incisions) whenever your bandages  (dressings) are changed. Look for any redness or swelling. °· You will need to return to have the stitches (sutures) or staples taken out. Ask your surgeon when to do that. °· Ask your surgeon when you can shower or bathe. °Activity °· Try to keep your legs raised when you are sitting. °· Do any exercises your health care providers have given you. These may include deep breathing exercises, coughing, walking, or other exercises. °SEEK MEDICAL CARE IF: °· You have any questions about your medicines. °· You have more leg pain, especially if your pain medicine stops working. °· New or growing bruises develop on your leg. °· Your leg swells, feels tight, or becomes red. °· You have numbness in your leg. °SEEK IMMEDIATE MEDICAL CARE IF: °· Your pain gets much worse. °· Blood or fluid leaks from any of the incisions. °· Your incisions become warm, swollen, or red. °· You have chest pain. °· You have trouble breathing. °· You have a fever. °· You have more pain near your leg incision. °MAKE SURE YOU: °· Understand these instructions. °· Will watch your condition. °· Will get help right away if you are not doing well or get worse. °Document Released: 07/15/2011 Document Revised: 11/07/2013 Document Reviewed:   07/15/2011 °ExitCare® Patient Information ©2015 [employer]. This information is not intended to replace advice given to you by your health care provider. Make sure you discuss any questions you have with your health care provider. ° ° °

## 2014-05-14 ENCOUNTER — Inpatient Hospital Stay (HOSPITAL_COMMUNITY): Payer: BC Managed Care – PPO

## 2014-05-14 LAB — CBC
HCT: 27.6 % — ABNORMAL LOW (ref 39.0–52.0)
Hemoglobin: 9.1 g/dL — ABNORMAL LOW (ref 13.0–17.0)
MCH: 29.8 pg (ref 26.0–34.0)
MCHC: 33 g/dL (ref 30.0–36.0)
MCV: 90.5 fL (ref 78.0–100.0)
Platelets: 307 10*3/uL (ref 150–400)
RBC: 3.05 MIL/uL — ABNORMAL LOW (ref 4.22–5.81)
RDW: 12.8 % (ref 11.5–15.5)
WBC: 5.8 10*3/uL (ref 4.0–10.5)

## 2014-05-14 LAB — BASIC METABOLIC PANEL
BUN: 33 mg/dL — ABNORMAL HIGH (ref 6–23)
CO2: 29 mEq/L (ref 19–32)
Calcium: 9.1 mg/dL (ref 8.4–10.5)
Chloride: 100 mEq/L (ref 96–112)
Creatinine, Ser: 1.55 mg/dL — ABNORMAL HIGH (ref 0.50–1.35)
GFR calc Af Amer: 58 mL/min — ABNORMAL LOW (ref >90)
GFR calc non Af Amer: 50 mL/min — ABNORMAL LOW (ref >90)
Glucose, Bld: 100 mg/dL — ABNORMAL HIGH (ref 70–99)
Potassium: 5.3 mEq/L (ref 3.7–5.3)
Sodium: 140 mEq/L (ref 137–147)

## 2014-05-14 LAB — GLUCOSE, CAPILLARY
GLUCOSE-CAPILLARY: 109 mg/dL — AB (ref 70–99)
Glucose-Capillary: 139 mg/dL — ABNORMAL HIGH (ref 70–99)
Glucose-Capillary: 177 mg/dL — ABNORMAL HIGH (ref 70–99)
Glucose-Capillary: 118 mg/dL — ABNORMAL HIGH (ref 70–99)

## 2014-05-14 MED ORDER — CARVEDILOL 3.125 MG PO TABS
3.1250 mg | ORAL_TABLET | Freq: Two times a day (BID) | ORAL | Status: DC
  Administered 2014-05-14 – 2014-05-15 (×3): 3.125 mg via ORAL
  Filled 2014-05-14 (×5): qty 1

## 2014-05-14 NOTE — Progress Notes (Signed)
CARDIAC REHAB PHASE I   PRE:  Rate/Rhythm: 78 SR with PVCs    BP: sitting 111/63    SaO2: 95 RA  MODE:  Ambulation: 200 ft   POST:  Rate/Rhythm: 92     BP: sitting 114/71     SaO2: 97 RA  Pt very willing to walk. Assist x2 to stand from bed due to low position. Unsteady walking, seemingly due to weak legs/knees. Pt sts he has been mostly in the bed since October. No major c/o. To chair after walk, VSS. Will f/u. [id number]   [name] [name] [place] CES, ACSM 05/14/2014 8:58 AM

## 2014-05-14 NOTE — Progress Notes (Addendum)
Inpatient Rehabilitation  I met with [name] [name] at the bedside and spoke with his wife [name] [name] over the phone.  I discussed the post acute rehab options with them and they both prefer [facility]. Wife states she cannot provide physical assist due to her back issues.   I will submit for insurance authorization and will notify medical team as soon as I receive a response from [facility].  I discussed with SW.  My co-worker Danne Baxter will follow up tomorrow in my absence [phone]).  Please call if questions.  [place] Admissions Coordinator Cell [phone] Office [phone]

## 2014-05-14 NOTE — Progress Notes (Signed)
05/14/2014 6:15 PM Nursing note Pt. Ambulated 200 ft with RW, 2 person assist and on room air. Pt. Tolerated well. Encouraged one more walk this evening.  Flippin, Arville Lime

## 2014-05-14 NOTE — Progress Notes (Addendum)
Update: CSW provided bed offers to patient's wife over the phone to inform of SNF options.  CSW spoke to patient and patient's wife by bedside about SNF as a backup plan. Patient's wife states that she is open to the [facility]F's as a backup plan if CIR is denied. Patient's wife states she is hopeful for CI but will look into SNF as a second choice. CSW provided patient and patient's wife with a SNF list and encouraged patient and patient's wife to decide on a facility by tomorrow. Both verbalized understanding. FL2 on chart for MD signature.  Jeanette Caprice, MSW, [place]

## 2014-05-14 NOTE — Progress Notes (Signed)
Physical Therapy Treatment Patient Details Name: [name] [name] MRN: [id number] DOB: [birth year] Today's Date: 05/14/2014    History of Present Illness The patient is a 53 year old gentleman with a long history of diabetes and hypertension and a family history of premature coronary artery disease who underwent surgery on his left foot in September 2014 for a non-healing wound. 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 Postop he reports having shortness of breath and pulmonary edema leading to a cardiac workup at [facility] with an echo showing a low ejection fraction of 15%. His wife says that he developed renal failure with a creatinine that rose to over 5 and it was felt that he had nephrotoxicity due to an antibiotic. He had a kidney biopsy and was treated with steroids with gradual improvement. He was in the hospital for several weeks and discharged with an external defibrillator. He was treated medically and was found to have an improvement in his EF. The external defibrillator was removed. He had a stress cardiolyte in March 2015 showing a fixed inferior and reversible lateral defect with an EF of 40%.Now s/p CABG    PT Comments    Pt admitted with above. Pt currently with functional limitations due to balance and endurance deficits.  Continue to recommend Rehab as pt needs assist with sit to stand and has poor endurance especially as the day progresses and pt fatigues.  Pt will benefit from skilled PT to increase their independence and safety with mobility to allow discharge to the venue listed below.   Follow Up Recommendations  CIR     Equipment Recommendations  Rolling walker with 5" wheels;3in1 (PT)    Recommendations for Other Services OT consult     Precautions / Restrictions Precautions Precautions: Sternal;Fall Required Braces or Orthoses: Other Brace/Splint Other Brace/Splint: flat darco shoe Restrictions Weight Bearing Restrictions: No Other Position/Activity  Restrictions: offload lt metatarsals as able    Mobility  Bed Mobility                  Transfers Overall transfer level: Needs assistance Equipment used: Rolling walker (2 wheeled) Transfers: Sit to/from Stand Sit to Stand: Mod assist;Max assist         General transfer comment: Assist needed to bring hips up. Pt used hands on knees for sternal precautions. Took several attempts on 2nd attempt at sit to stand.  Pt just did not have the strength to push up and lift hips off chair.  Ended up helping a lot more.  Needed cues for sternal precautions with each stand as pt trying to use hands.   Ambulation/Gait Ambulation/Gait assistance: Min assist Ambulation Distance (Feet): 145 Feet Assistive device: Rolling walker (2 wheeled) Gait Pattern/deviations: Step-through pattern;Decreased step length - right;Decreased step length - left;Narrow base of support;Decreased weight shift to left Gait velocity: decr Gait velocity interpretation: Below normal speed for age/gender General Gait Details: Pt initially did well with ambulation. As he fatigued, pt began to have decr left knee control as well as leaning a little more on his hands/arms.  Pt rested and exercised and then walked to sink and brushed teeth.  On the way back to the bed, pt lost balance needing mod assist to regain control of balance.     Stairs            Wheelchair Mobility    Modified Rankin (Stroke Patients Only)       Balance Overall balance assessment: Needs assistance;History of Falls Sitting-balance support: No upper extremity  supported;Feet supported Sitting balance-Leahy Scale: Poor   Postural control: Posterior lean Standing balance support: Bilateral upper extremity supported;During functional activity Standing balance-Leahy Scale: Poor Standing balance comment: Lost balance significantly x 1 on way back to bed.                      Cognition Arousal/Alertness: Awake/alert Behavior  During Therapy: WFL for tasks assessed/performed Overall Cognitive Status: Within Functional Limits for tasks assessed                      Exercises      General Comments        Pertinent Vitals/Pain VSS, no pain    Home Living                      Prior Function            PT Goals (current goals can now be found in the care plan section) Progress towards PT goals: Progressing toward goals    Frequency  Min 3X/week    PT Plan Current plan remains appropriate    Co-evaluation             End of Session Equipment Utilized During Treatment: Gait belt;Oxygen Activity Tolerance: Patient limited by fatigue Patient left: in chair;with call bell/phone within reach     Time: [id number] PT Time Calculation (min): 24 min  Charges:  $Gait Training: 8-22 mins $Therapeutic Exercise: 8-22 mins                    G Codes:      [name] [name],[name] [name] 06-04-2014, 12:59 PM [facility] Acute Rehabilitation [phone] (pager)

## 2014-05-14 NOTE — Progress Notes (Addendum)
      [address]       [place] [address]             [phone]        7 Days Post-Op Procedure(s) (LRB): CORONARY ARTERY BYPASS GRAFTING (CABG) (N/A) INTRAOPERATIVE TRANSESOPHAGEAL ECHOCARDIOGRAM (N/A)  Subjective: Patient without complaints.  Objective: Vital signs in last 24 hours: Temp:  [98.3 F (36.8 C)-98.6 F (37 C)] 98.3 F (36.8 C) (06/29 0444) Pulse Rate:  [81-91] 81 (06/29 0444) Cardiac Rhythm:  [-] Normal sinus rhythm (06/28 2123) Resp:  [16-18] 16 (06/29 0444) BP: (126-139)/(69-70) 126/70 mmHg (06/29 0444) SpO2:  [96 %-97 %] 97 % (06/29 0444) Weight:  [228 lb 4.8 oz (103.556 kg)] 228 lb 4.8 oz (103.556 kg) (06/29 0444)  Pre op weight 103 kg Current Weight  05/14/14 228 lb 4.8 oz (103.556 kg)      Intake/Output from previous day: 06/28 0701 - 06/29 0700 In: 480 [P.O.:480] Out: 2400 [Urine:2400]   Physical Exam:  Cardiovascular: RRR Pulmonary: Clear to auscultation bilaterally; no rales, wheezes, or rhonchi. Abdomen: Soft, non tender, bowel sounds present. Extremities: Trace lower extremity edema. Wounds: Clean and dry.  No erythema or signs of infection.  Lab Results: CBC: Recent Labs  05/13/14 0445 05/14/14 0330  WBC 6.4 5.8  HGB 8.5* 9.1*  HCT 26.5* 27.6*  PLT 267 307   BMET:  Recent Labs  05/13/14 0445 05/14/14 0330  NA 141 140  K 4.6 5.3  CL 100 100  CO2 27 29  GLUCOSE 121* 100*  BUN 31* 33*  CREATININE 1.34 1.55*  CALCIUM 8.8 9.1    PT/INR:  Lab Results  Component Value Date   INR 1.36 05/07/2014   INR 1.05 05/03/2014   ABG:  INR: Will add last result for INR, ABG once components are confirmed Will add last 4 CBG results once components are confirmed  Assessment/Plan:  1. CV - SR/PVCs in the 80's. On Lopressor 12.5 bid. Was on Coreg pre op so will restart. 2.  Pulmonary - CXR appears to show pneumothorax, small bilateral pleural effusions and atelectasis 3. Volume Overload - On Lasix 40  daily. Will stop today. 4.  Acute blood loss anemia - H and H up to 9.1 and 27.6 5. Urinary retention-Foley reinserted on Saturday and on Flomax.  Likely try voiding trial today. 6. Creatinine up from 1.34 to 1.55. Creatinine upon admission was 1.62. Likely some underlying renal insufficiency.Re check in am. Not on ACE or ARB. On low dose Neurontin. 7. Potassium up to 5.3. Stop supplementation 8. DM-CBGs 154/127/109. On Insulin 9. Remove EPW 10. Hopefully, to CIR or SNF in 1-2 days   [name] [name],[name] [name] 05/14/2014,7:47 AM  Looks great Incisions clean CXR improved aeration  patient examined and medical record reviewed,agree with above note. VAN TRIGT III,[name] [name] 05/14/2014

## 2014-05-14 NOTE — Progress Notes (Signed)
Clinical Social Work Department CLINICAL SOCIAL WORK PLACEMENT NOTE 05/14/2014  Patient:  Tolosa,Yerachmiel  Account Number:  [id number] Admit date:  05/07/2014  Clinical Social Worker:  Megan Salon  Date/time:  05/14/2014 12:06 PM  Clinical Social Work is seeking post-discharge placement for this patient at the following level of care:   [place]   (*CSW will update this form in Epic as items are completed)   05/14/2014  Patient/family provided with [place] Department of Clinical Social Work's list of facilities offering this level of care within the geographic area requested by the patient (or if unable, by the patient's family).  05/14/2014  Patient/family informed of their freedom to choose among providers that offer the needed level of care, that participate in Medicare, Medicaid or managed care program needed by the patient, have an available bed and are willing to accept the patient.  05/14/2014  Patient/family informed of [facility]' ownership interest in [facility], as well as of the fact that they are under no obligation to receive care at this facility.  PASARR submitted to EDS on 05/14/2014 PASARR number received on 05/14/2014  FL2 transmitted to all facilities in geographic area requested by pt/family on  05/14/2014 FL2 transmitted to all facilities within larger geographic area on   Patient informed that his/her managed care company has contracts with or will negotiate with  certain facilities, including the following:     Patient/family informed of bed offers received:   Patient chooses bed at  Physician recommends and patient chooses bed at    Patient to be transferred to  on   Patient to be transferred to facility by  Patient and family notified of transfer on  Name of family member notified:    The following physician request were entered in Epic:   Additional Comments:  Jeanette Caprice, MSW, [place]

## 2014-05-14 NOTE — Progress Notes (Signed)
Occupational Therapy Treatment Patient Details Name: [name] [name] MRN: [id number] DOB: Mar 05, 1961 Today's Date: 05/14/2014    History of present illness The patient is a 53 year old gentleman with a long history of diabetes and hypertension and a family history of premature coronary artery disease who underwent surgery on his left foot in September 2014 for a non-healing wound. 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 Postop he reports having shortness of breath and pulmonary edema leading to a cardiac workup at [facility] with an echo showing a low ejection fraction of 15%. His wife says that he developed renal failure with a creatinine that rose to over 5 and it was felt that he had nephrotoxicity due to an antibiotic. He had a kidney biopsy and was treated with steroids with gradual improvement. He was in the hospital for several weeks and discharged with an external defibrillator. He was treated medically and was found to have an improvement in his EF. The external defibrillator was removed. He had a stress cardiolyte in March 2015 showing a fixed inferior and reversible lateral defect with an EF of 40%.Now s/p CABG   OT comments  Pt demonstrates balance deficits and decr activity tolerance during session. Pt could greatly benefit from skilled OT in rehab setting to reach mod I level for adls. Pt progressing toward goals. Pt is fall risk at this time.   Follow Up Recommendations  CIR    Equipment Recommendations  Other (comment)    Recommendations for Other Services Rehab consult    Precautions / Restrictions Precautions Precautions: Sternal;Fall Required Braces or Orthoses: Other Brace/Splint Other Brace/Splint: flat darco shoe Restrictions Weight Bearing Restrictions: No Other Position/Activity Restrictions: offload lt metatarsals as able       Mobility Bed Mobility Overal bed mobility: Needs Assistance Bed Mobility: Supine to Sit;Sit to Supine     Supine to sit: Mod  assist;HOB elevated Sit to supine: Min guard   General bed mobility comments: pt requires (A) from [place] 20 degrees to achieve upright posture EOB  Transfers Overall transfer level: Needs assistance Equipment used: Rolling walker (2 wheeled) Transfers: Sit to/from Stand Sit to Stand: Mod assist;From elevated surface         General transfer comment: Assist needed to bring hips up. Pt used hands on knees for sternal precautions. Took several attempts on 2nd attempt at sit to stand.  Pt just did not have the strength to push up and lift hips off chair.  Ended up helping a lot more.      Balance Overall balance assessment: Needs assistance;History of Falls Sitting-balance support: No upper extremity supported;Feet supported Sitting balance-Leahy Scale: Poor   Postural control: Posterior lean Standing balance support: Bilateral upper extremity supported;During functional activity Standing balance-Leahy Scale: Poor Standing balance comment: Lost balance significantly x 1 on way back to bed.                     ADL Overall ADL's : Needs assistance/impaired Eating/Feeding: Modified independent;Bed level   Grooming: Wash/dry hands;Min guard Grooming Details (indicate cue type and reason): pt very unsteady with bil ue hand use. pt reports feeling unsteady. pt placing hand on counter and alternating due to balance deficits             Lower Body Dressing: Moderate assistance;Sit to/from stand Lower Body Dressing Details (indicate cue type and reason): requires AE reacher to achieve higher level of dressing. Pt is able to cross bil LE to hook feet into pants/ socks,  shoes. Pt don shoe supervision Toilet Transfer: Total assistance;RW Toilet Transfer Details (indicate cue type and reason): pt can not achieve sit<>Stand from standard height. Pt required bed elevated so that hips are higher than knee to achieve sit<>stand mod (A) maintaining sternal precautions         Functional  mobility during ADLs: Minimal assistance;Rolling walker General ADL Comments: Pt fatigued s/p PT session and had returned supine. pt agreeable to OOB and participation. Pt demonstrates deficits with sit<>Stand and unable to achieve from standard height. pt with posterior unsteady lean with RW use. pt needed education on RW safety wtih adls.       Vision                     Perception     Praxis      Cognition   Behavior During Therapy: WFL for tasks assessed/performed Overall Cognitive Status: Within Functional Limits for tasks assessed                       Extremity/Trunk Assessment               Exercises     Shoulder Instructions       General Comments      Pertinent Vitals/ Pain       5-6 out 10 Rn present and providing oral medications  Home Living                                          Prior Functioning/Environment              Frequency Min 2X/week     Progress Toward Goals  OT Goals(current goals can now be found in the care plan section)  Progress towards OT goals: Progressing toward goals  Acute Rehab OT Goals Patient Stated Goal: to get better OT Goal Formulation: With patient Time For Goal Achievement: 05/24/14 Potential to Achieve Goals: Good ADL Goals Pt Will Perform Grooming: with min assist;sitting Pt Will Perform Upper Body Bathing: with min assist;sitting Pt Will Perform Lower Body Bathing: with max assist;sit to/from stand Pt Will Transfer to Toilet: with max assist;bedside commode Additional ADL Goal #1: Pt will complete bed mobility mod (A)  Plan Discharge plan remains appropriate    Co-evaluation                 End of Session     Activity Tolerance Patient tolerated treatment well   Patient Left in bed;with call bell/phone within reach   Nurse Communication Mobility status;Precautions        Time: 1135-1150 OT Time Calculation (min): 15 min  Charges: OT General  Charges $OT Visit: 1 Procedure OT Treatments $Self Care/Home Management : 8-22 mins  Parke Poisson B 05/14/2014, 1:07 PM Pager: [phone]

## 2014-05-14 NOTE — Progress Notes (Signed)
Clinical Social Work Department BRIEF PSYCHOSOCIAL ASSESSMENT 05/14/2014  Patient:  [name],[name]     Account Number:  [id number]     Admit date:  05/07/2014  Clinical Social Worker:  Megan Salon  Date/Time:  05/14/2014 12:02 PM  Referred by:  Physician  Date Referred:  05/14/2014 Referred for  SNF Placement   Other Referral:   Interview type:  Patient Other interview type:    PSYCHOSOCIAL DATA Living Status:  WIFE Admitted from facility:   Level of care:   Primary support name:  [name] [name] Primary support relationship to patient:  SPOUSE Degree of support available:   Good    CURRENT CONCERNS Current Concerns  Post-Acute Placement   Other Concerns:    SOCIAL WORK ASSESSMENT / PLAN Clinical Social Worker received referral for SNF backup placement at d/c. Clinical Social Worker met with patient at bedside to offer support and discuss patient needs at discharge.  CSW introduced self and explained reason for visit. CSW explained SNF process and provided SNF packet to patient. Patient reported he wants to go to CIR and does not really want to go to SNF. Patient states he has had family members at a SNF and did not like it. Patient states he is agreeable for social worker to at least fax his information out, but also wants social worker to speak with his wife. CSW will complete FL2 for MD's signature and will update patient and family when bed offers are received.    CSW remains available for support and to facilitate patient discharge needs once medically ready.   Assessment/plan status:  Psychosocial Support/Ongoing Assessment of Needs Other assessment/ plan:   Information/referral to community resources:   SNF information/CSW contact information    PATIENT'S/FAMILY'S RESPONSE TO PLAN OF CARE: Patient states he wants to go to CIR and is not sure about SNF placement at this time, but was agreeable for social worker to start process.        Jeanette Caprice,  MSW, [place]

## 2014-05-14 NOTE — Progress Notes (Signed)
05/14/2014 12:27 PM Nursing note Epw d/c per orders and per protocol. Ends intact. Pt. Tolerated well. Vs collected per protocol. Call bell within reach. Pt. Advised br x 1 hr. Will continue to monitor patient.  Dima Ferrufino, Arville Lime

## 2014-05-14 NOTE — PMR Pre-admission (Signed)
PMR Admission Coordinator Pre-Admission Assessment  Patient: [name] [name] is an 53 y.o., male MRN: [id number] DOB: 1961-04-06 Height: 6\' 1"  (185.4 cm) Weight: 102.5 kg (225 lb 15.5 oz)              Insurance Information HMO: no   PPO:  yes     PCP:  no     IPA:  no     80/20:  no     OTHER:  No  PRIMARY:  BCBS [place] PPO      Policy#:  [id number]    Subscriber: pt.  CM Name: Phyllis Ginger      Phone#: [phone]     Fax#: [phone] Pre-Cert#: [id number] approved until 7/9. Updates needed by 12 noon on 7/9 or call with d/c date      Employer: not employed/Obamacare/[employer] Benefits:  Phone #: [phone]     Name:  Britni Eff. Date:  01/14/14     Deduct:  $500      Out of Pocket Max:  $700      Life Max:  none CIR:  100%      SNF:  100%, 60 days Outpatient:  100%     Co-Pay:  No copay, no visit limits Home Health:  100%       Co-Pay:  no DME:  100%     Co-Pay:   Providers:  In network   SECONDARY:   none   Medicaid Application Date:       Case Manager:  Disability Application Date:  Pt. Reports he has applied from disability and has been approved.  He is waiting on his first check   06/2014   Case Worker:    Emergency [place]   Name Relation Home Work [place] Spouse [phone]      Current Medical History  Patient Admitting Diagnosis: deconditioning after CABG and multiple medical.   History of Present Illness: [name] is a 53 y.o. 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 Hospital course at that time complicated by AKI due to antibiotics as well as SOB post op and was found to have EF 15% and defibrillator placed prior to d/c. He had a stress cardiolyte in March 2015 showing a fixed inferior and reversible  lateral defect with an EF of 40% and had improvement in EF and ICD removed. He has had intermittent chest discomfort as well as dizziness and falls question due to medication SE. Cardiac cath at [facility] revealed severe multivessel disease and he was admitted on 05/07/14 for CABG by Dr. Cyndia Bent. Post op pulmonary edema treated with diuresis and ABLA being monitored. DARCO shoe ordered for left foot and ailver Hydrofiber for hyperkeratotic periwound with minimal drainage. Foley placed due to urinary retention and voiding trial initiated on 05/14/14.Foley removed 05/15/14. CXR with small bilateral pleural effusions and small PTX--IS encouraged. Therapy ongoing and patient noted to be deconditioned due to multiple medical issues.  Creat 1.42 on 05/15/14. Creat 1.62 on admission. Likely some underlying renal insufficiency.  Past Medical History  Past Medical History  Diagnosis Date  . CAD (coronary artery disease)   . Cardiomyopathy   . Chest pain   . Congestive heart failure   . Diabetes   . Dyspnea   . Hypertension   . Peripheral vascular disease   .  Diabetic foot ulcers     left foot 2nd digit ant 5 th digit 05/03/14  . Hypercholesterolemia   . Myocardial infarction   . Chronic kidney disease   . Neuropathy in diabetes     Family History  family history includes Diabetes type II in an other family member; Heart disease in his father; Hypertension in an other family member; Kidney disease in an other family member.  Prior Rehab/Hospitalizations: per pt., Oct '14 hospitalization for heart and kidney failure.  No IP rehab.   Current Medications  Current facility-administered medications:0.9 %  sodium chloride infusion, 250 mL, Intravenous, PRN, Gaye Pollack, MD;  acetaminophen (TYLENOL) tablet 650 mg, 650 mg, Oral, Q6H PRN, Gaye Pollack, MD, 650 mg at 05/12/14 1240;  aspirin EC tablet 325 mg, 325 mg, Oral, Daily, Gaye Pollack, MD, 325 mg at 05/15/14 0956;  atorvastatin (LIPITOR) tablet 40 mg, 40  mg, Oral, q1800, Erin Barrett, PA-C, 40 mg at 05/14/14 1701 bisacodyl (DULCOLAX) EC tablet 10 mg, 10 mg, Oral, Daily PRN, Gaye Pollack, MD;  bisacodyl (DULCOLAX) suppository 10 mg, 10 mg, Rectal, Daily PRN, Gaye Pollack, MD, 10 mg at 05/12/14 1441;  carvedilol (COREG) tablet 3.125 mg, 3.125 mg, Oral, BID WC, Donielle M Zimmerman, PA-C, 3.125 mg at 05/15/14 [id number];  docusate sodium (COLACE) capsule 200 mg, 200 mg, Oral, Daily, Gaye Pollack, MD, 200 mg at 05/15/14 0956 enoxaparin (LOVENOX) injection 40 mg, 40 mg, Subcutaneous, QHS, Gaye Pollack, MD, 40 mg at 05/14/14 2232;  gabapentin (NEURONTIN) capsule 100 mg, 100 mg, Oral, BID, Donielle M Zimmerman, PA-C, 100 mg at 05/15/14 0956;  insulin aspart (novoLOG) injection 0-24 Units, 0-24 Units, Subcutaneous, TID AC & HS, Gaye Pollack, MD, 4 Units at 05/15/14 1128 insulin detemir (LEVEMIR) injection 10 Units, 10 Units, Subcutaneous, Daily, Gaye Pollack, MD, 10 Units at 05/15/14 [phone];  oxyCODONE (Oxy IR/ROXICODONE) immediate release tablet 5-10 mg, 5-10 mg, Oral, Q3H PRN, Gaye Pollack, MD, 10 mg at 05/15/14 1001;  pantoprazole (PROTONIX) EC tablet 40 mg, 40 mg, Oral, QAC breakfast, Gaye Pollack, MD, 40 mg at 05/15/14 0754 sodium chloride 0.9 % injection 3 mL, 3 mL, Intravenous, Q12H, Gaye Pollack, MD, 3 mL at 05/15/14 1000;  sodium chloride 0.9 % injection 3 mL, 3 mL, Intravenous, PRN, Gaye Pollack, MD;  tamsulosin (FLOMAX) capsule 0.4 mg, 0.4 mg, Oral, Daily, Erin Barrett, PA-C, 0.4 mg at 05/15/14 [id number];  traMADol (ULTRAM) tablet 50-100 mg, 50-100 mg, Oral, Q4H PRN, Gaye Pollack, MD  Patients Current Diet: carb modified, thin liquids  Precautions / Restrictions Precautions Precautions: Sternal;Fall Precaution Comments: sternal precautions Other Brace/Splint: flat darco shoe Restrictions Weight Bearing Restrictions: No Other Position/Activity Restrictions: offload lt metatarsals as able   Prior Activity Level Community (5-7x/wk): Pt.  reports he and his wife have one car .  When he takes her to work, she will do errands several times per week.  He reports he lost his job in November '14 due to his health issues.  Home Assistive Devices / Equipment Home Assistive Devices/Equipment: Gilford Rile (specify type);Eyeglasses Home Equipment: Walker - 2 wheels  Prior Functional Level Prior Function Level of Independence: Independent with assistive device(s) Comments: Pt reports he gets out in the yard most days but has recently been limited by dizziness  Current Functional Level Cognition  Overall Cognitive Status: Within Functional Limits for tasks assessed Orientation Level: Oriented X4    Extremity Assessment (includes Sensation/Coordination)  ADLs  Overall ADL's : Needs assistance/impaired Eating/Feeding: Modified independent;Bed level Grooming: Wash/dry hands;Min guard Grooming Details (indicate cue type and reason): pt very unsteady with bil ue hand use. pt reports feeling unsteady. pt placing hand on counter and alternating due to balance deficits Upper Body Dressing : Maximal assistance;Sitting Lower Body Dressing: Moderate assistance;Sit to/from stand Lower Body Dressing Details (indicate cue type and reason): requires AE reacher to achieve higher level of dressing. Pt is able to cross bil LE to hook feet into pants/ socks, shoes. Pt don shoe supervision Toilet Transfer: Total assistance;RW Toilet Transfer Details (indicate cue type and reason): pt can not achieve sit<>Stand from standard height. Pt required bed elevated so that hips are higher than knee to achieve sit<>stand mod (A) maintaining sternal precautions Functional mobility during ADLs: Minimal assistance;Rolling walker General ADL Comments: Pt fatigued s/p PT session and had returned supine. pt agreeable to OOB and participation. Pt demonstrates deficits with sit<>Stand and unable to achieve from standard height. pt with posterior unsteady lean with  RW use. pt needed education on RW safety wtih adls.     Mobility  Overal bed mobility: Needs Assistance Bed Mobility: Supine to Sit;Sit to Supine Supine to sit: Mod assist;HOB elevated Sit to supine: Min guard General bed mobility comments: pt requires (A) from [place] 20 degrees to achieve upright posture EOB    Transfers  Overall transfer level: Needs assistance Equipment used: Rolling walker (2 wheeled) Transfers: Sit to/from Stand Sit to Stand: Mod assist;From elevated surface Stand pivot transfers: +2 physical assistance;Mod assist General transfer comment: Assist needed to bring hips up. Pt used hands on knees for sternal precautions. Took several attempts on 2nd attempt at sit to stand.  Pt just did not have the strength to push up and lift hips off chair.  Ended up helping a lot more.      Ambulation / Gait / Stairs / Wheelchair Mobility  Ambulation/Gait Ambulation/Gait assistance: Museum/gallery curator (Feet): 145 Feet Assistive device: Rolling walker (2 wheeled) Gait Pattern/deviations: Step-through pattern;Decreased step length - right;Decreased step length - left;Narrow base of support;Decreased weight shift to left Gait velocity: decr Gait velocity interpretation: Below normal speed for age/gender General Gait Details: Pt initially did well with ambulation. As he fatigued, pt began to have decr left knee control as well as leaning a little more on his hands/arms.  Pt rested and exercised and then walked to sink and brushed teeth.  On the way back to the bed, pt lost balance needing mod assist to regain control of balance.      Posture / Balance      Special needs/care consideration BiPAP/CPAP  no CPM  no Continuous Drip IV  no Dialysis  no        Life Vest  no Oxygen  no Special Bed  no Trach Size  No  Wound Vac (area)  no      Skin ulcer 2nd toe left foot                               Bowel mgmt: continent Bladder mgmt: foley for difficulty voiding foley  removed 05/15/14. Diabetic mgmt yes Hgb A1C 6.5    Previous Home Environment Living Arrangements: Other (Comment) (wife's 32 year old son living with pt./wife this summer) Available Help at Discharge: Other (Comment) (wife is a Pharmacist, hospital and off from work for the summer) Type of Home: [place]: One  level Home Access: Stairs to enter Entrance Stairs-Rails: Psychiatric nurse of Steps: 5 Bathroom Shower/Tub: Multimedia programmer: Standard Bathroom Accessibility: Yes How Accessible: Accessible via walker Home Care Services: Other (Comment) Additional Comments: Not currently; Pt. had HHRN for wound vac 11/14 to 2/15 for foot ulcer per pt. Pt and his wife married since 11/2013. She is a Optometrist. Her Mom is at ALF, her brother at SNF with Hospice so she is feeling overwhelmed.  Discharge Living Setting Plans for Discharge Living Setting: Patient's home Type of Home at Discharge: House Discharge Home Layout: One level Discharge Home Access: Stairs to enter Entrance Stairs-Rails: Right;Left Entrance Stairs-Number of Steps: 5 Discharge Bathroom Shower/Tub: Walk-in shower Discharge Bathroom Toilet: Standard Discharge Bathroom Accessibility: Yes How Accessible: Accessible via walker Does the patient have any problems obtaining your medications?:  (pt. reports he cant afford his Crestor)  Social/Family/Support Systems Patient Roles: Spouse;Parent Anticipated Caregiver: wife, Ademir Berenguer Anticipated Caregiver's Contact Information: [phone] Ability/Limitations of Caregiver: wife reports she has back trouble which limits her to lifting less than 10#;  She states she needs her husband to be fully independent in getting up and down steps and to the bathroom. Caregiver Availability: 24/7 Discharge Plan Discussed with Primary Caregiver: Yes Is Caregiver In Agreement with Plan?: Yes Does Caregiver/Family have Issues with Lodging/Transportation  while Pt is in Rehab?: No  Goals/Additional Needs Patient/Family Goal for Rehab: modified independent and supervision for PT/OT Expected length of stay: 10-14 days Cultural Considerations: pt. denies Dietary Needs: carb modified Equipment Needs: TBD Pt/Family Agrees to Admission and willing to participate: Yes   Decrease burden of Care through IP rehab admission:  no   Possible need for SNF placement upon discharge:  Not anticipated   Patient Condition: This patient's medical and functional status has changed since the consult dated: 05/11/14 in which the Rehabilitation Physician determined and documented that the patient's condition is appropriate for intensive rehabilitative care in an inpatient rehabilitation facility. See "History of Present Illness" (above) for medical update. Functional changes are: overall min assist. Patient's medical and functional status update has been discussed with the Rehabilitation physician and patient remains appropriate for inpatient rehabilitation. Will admit to inpatient rehab today.  Preadmission Screen Completed By:  Cleatrice Burke, 05/15/2014 12:06 PM ______________________________________________________________________   Discussed status with Dr. Letta Pate on 05/15/2014 at  1215 and received telephone approval for admission today.  Admission Coordinator:  Cleatrice Burke, time [id number] Date 05/15/2014.

## 2014-05-14 NOTE — Care Management Note (Unsigned)
    Page 1 of 1   05/14/2014     4:34:34 PM CARE MANAGEMENT NOTE 05/14/2014  Patient:  Gencarelli,Floy   Account Number:  [id number]  Date Initiated:  05/14/2014  Documentation initiated by:  AMERSON,JULIE  Subjective/Objective Assessment:   Pt adm on 6/22 s/p CABG x 5.  PTA, pt resides at home with spouse.     Action/Plan:   PT recommending CIR prior to dc home; CSW following for backup SNF should CIR decline admission.  Will follow progress.   Anticipated DC Date:  05/17/2014   Anticipated DC Plan:  IP REHAB FACILITY  In-house referral  Clinical Social Worker      DC Planning Services  CM consult      Choice offered to / List presented to:             Status of service:  In process, will continue to follow Medicare Important Message given?   (If response is "NO", the following Medicare IM given date fields will be blank) Date Medicare IM given:   Date Additional Medicare IM given:    Discharge Disposition:    Per UR Regulation:  Reviewed for med. necessity/level of care/duration of stay  If discussed at [place] of Stay Meetings, dates discussed:    Comments:

## 2014-05-15 ENCOUNTER — Inpatient Hospital Stay (HOSPITAL_COMMUNITY)
Admission: RE | Admit: 2014-05-15 | Discharge: 2014-05-25 | DRG: 945 | Disposition: A | Discharge status: Home Health | Payer: BC Managed Care – PPO | Source: Intra-hospital | Attending: Physical Medicine & Rehabilitation | Admitting: Physical Medicine & Rehabilitation

## 2014-05-15 DIAGNOSIS — N189 Chronic kidney disease, unspecified: Secondary | ICD-10-CM | POA: Diagnosis present

## 2014-05-15 DIAGNOSIS — E1142 Type 2 diabetes mellitus with diabetic polyneuropathy: Secondary | ICD-10-CM

## 2014-05-15 DIAGNOSIS — I951 Orthostatic hypotension: Secondary | ICD-10-CM

## 2014-05-15 DIAGNOSIS — A498 Other bacterial infections of unspecified site: Secondary | ICD-10-CM

## 2014-05-15 DIAGNOSIS — N289 Disorder of kidney and ureter, unspecified: Secondary | ICD-10-CM

## 2014-05-15 DIAGNOSIS — Z951 Presence of aortocoronary bypass graft: Secondary | ICD-10-CM

## 2014-05-15 DIAGNOSIS — Z79899 Other long term (current) drug therapy: Secondary | ICD-10-CM

## 2014-05-15 DIAGNOSIS — I129 Hypertensive chronic kidney disease with stage 1 through stage 4 chronic kidney disease, or unspecified chronic kidney disease: Secondary | ICD-10-CM

## 2014-05-15 DIAGNOSIS — R5381 Other malaise: Secondary | ICD-10-CM

## 2014-05-15 DIAGNOSIS — E1165 Type 2 diabetes mellitus with hyperglycemia: Secondary | ICD-10-CM

## 2014-05-15 DIAGNOSIS — N390 Urinary tract infection, site not specified: Secondary | ICD-10-CM

## 2014-05-15 DIAGNOSIS — S98139A Complete traumatic amputation of one unspecified lesser toe, initial encounter: Secondary | ICD-10-CM

## 2014-05-15 DIAGNOSIS — E1149 Type 2 diabetes mellitus with other diabetic neurological complication: Secondary | ICD-10-CM

## 2014-05-15 DIAGNOSIS — Z8614 Personal history of Methicillin resistant Staphylococcus aureus infection: Secondary | ICD-10-CM

## 2014-05-15 DIAGNOSIS — M86679 Other chronic osteomyelitis, unspecified ankle and foot: Secondary | ICD-10-CM

## 2014-05-15 DIAGNOSIS — M9080 Osteopathy in diseases classified elsewhere, unspecified site: Secondary | ICD-10-CM

## 2014-05-15 DIAGNOSIS — D62 Acute posthemorrhagic anemia: Secondary | ICD-10-CM

## 2014-05-15 DIAGNOSIS — Z5189 Encounter for other specified aftercare: Principal | ICD-10-CM

## 2014-05-15 DIAGNOSIS — Z794 Long term (current) use of insulin: Secondary | ICD-10-CM

## 2014-05-15 DIAGNOSIS — E7800 Pure hypercholesterolemia, unspecified: Secondary | ICD-10-CM

## 2014-05-15 DIAGNOSIS — M86672 Other chronic osteomyelitis, left ankle and foot: Secondary | ICD-10-CM | POA: Diagnosis present

## 2014-05-15 DIAGNOSIS — B9620 Unspecified Escherichia coli [E. coli] as the cause of diseases classified elsewhere: Secondary | ICD-10-CM | POA: Diagnosis not present

## 2014-05-15 DIAGNOSIS — R339 Retention of urine, unspecified: Secondary | ICD-10-CM

## 2014-05-15 DIAGNOSIS — L97529 Non-pressure chronic ulcer of other part of left foot with unspecified severity: Secondary | ICD-10-CM

## 2014-05-15 DIAGNOSIS — E1169 Type 2 diabetes mellitus with other specified complication: Secondary | ICD-10-CM

## 2014-05-15 DIAGNOSIS — K5900 Constipation, unspecified: Secondary | ICD-10-CM

## 2014-05-15 DIAGNOSIS — E118 Type 2 diabetes mellitus with unspecified complications: Secondary | ICD-10-CM | POA: Diagnosis present

## 2014-05-15 DIAGNOSIS — I739 Peripheral vascular disease, unspecified: Secondary | ICD-10-CM

## 2014-05-15 DIAGNOSIS — L97509 Non-pressure chronic ulcer of other part of unspecified foot with unspecified severity: Secondary | ICD-10-CM

## 2014-05-15 DIAGNOSIS — E8779 Other fluid overload: Secondary | ICD-10-CM

## 2014-05-15 DIAGNOSIS — R338 Other retention of urine: Secondary | ICD-10-CM | POA: Diagnosis present

## 2014-05-15 DIAGNOSIS — I252 Old myocardial infarction: Secondary | ICD-10-CM

## 2014-05-15 DIAGNOSIS — E11621 Type 2 diabetes mellitus with foot ulcer: Secondary | ICD-10-CM | POA: Diagnosis present

## 2014-05-15 DIAGNOSIS — IMO0001 Reserved for inherently not codable concepts without codable children: Secondary | ICD-10-CM

## 2014-05-15 DIAGNOSIS — I2510 Atherosclerotic heart disease of native coronary artery without angina pectoris: Secondary | ICD-10-CM

## 2014-05-15 DIAGNOSIS — I25110 Atherosclerotic heart disease of native coronary artery with unstable angina pectoris: Secondary | ICD-10-CM

## 2014-05-15 LAB — BASIC METABOLIC PANEL
BUN: 33 mg/dL — AB (ref 6–23)
CHLORIDE: 100 meq/L (ref 96–112)
CO2: 27 mEq/L (ref 19–32)
Calcium: 8.9 mg/dL (ref 8.4–10.5)
Creatinine, Ser: 1.42 mg/dL — ABNORMAL HIGH (ref 0.50–1.35)
GFR calc Af Amer: 65 mL/min — ABNORMAL LOW (ref >90)
GFR, EST NON AFRICAN AMERICAN: 56 mL/min — AB (ref >90)
Glucose, Bld: 100 mg/dL — ABNORMAL HIGH (ref 70–99)
Potassium: 4.6 mEq/L (ref 3.7–5.3)
Sodium: 140 mEq/L (ref 137–147)

## 2014-05-15 LAB — URINALYSIS, ROUTINE W REFLEX MICROSCOPIC
BILIRUBIN URINE: NEGATIVE
Glucose, UA: NEGATIVE mg/dL
Hgb urine dipstick: NEGATIVE
Ketones, ur: NEGATIVE mg/dL
Leukocytes, UA: NEGATIVE
NITRITE: NEGATIVE
Protein, ur: NEGATIVE mg/dL
Specific Gravity, Urine: 1.01 (ref 1.005–1.030)
UROBILINOGEN UA: 0.2 mg/dL (ref 0.0–1.0)
pH: 7 (ref 5.0–8.0)

## 2014-05-15 LAB — GLUCOSE, CAPILLARY
GLUCOSE-CAPILLARY: 168 mg/dL — AB (ref 70–99)
Glucose-Capillary: 97 mg/dL (ref 70–99)
Glucose-Capillary: 133 mg/dL — ABNORMAL HIGH (ref 70–99)
Glucose-Capillary: 120 mg/dL — ABNORMAL HIGH (ref 70–99)

## 2014-05-15 MED ORDER — OXYCODONE HCL 5 MG PO TABS: 5.0000 mg | ORAL_TABLET | ORAL | Status: DC | PRN

## 2014-05-15 MED ORDER — ENOXAPARIN SODIUM 40 MG/0.4ML ~~LOC~~ SOLN
40.0000 mg | SUBCUTANEOUS | Status: DC
  Administered 2014-05-15 – 2014-05-24 (×10): 40 mg via SUBCUTANEOUS
  Filled 2014-05-15 (×11): qty 0.4

## 2014-05-15 MED ORDER — TAMSULOSIN HCL 0.4 MG PO CAPS: 0.4000 mg | ORAL_CAPSULE | Freq: Every day | ORAL | Status: DC

## 2014-05-15 MED ORDER — POLYSACCHARIDE IRON COMPLEX 150 MG PO CAPS
150.0000 mg | ORAL_CAPSULE | Freq: Two times a day (BID) | ORAL | Status: DC
  Administered 2014-05-15 – 2014-05-24 (×19): 150 mg via ORAL
  Filled 2014-05-15 (×21): qty 1

## 2014-05-15 MED ORDER — FLEET ENEMA 7-19 GM/118ML RE ENEM: 1.0000 | ENEMA | Freq: Once | RECTAL | Status: AC | PRN

## 2014-05-15 MED ORDER — INSULIN ASPART 100 UNIT/ML ~~LOC~~ SOLN
0.0000 [IU] | Freq: Three times a day (TID) | SUBCUTANEOUS | Status: DC
  Administered 2014-05-15 – 2014-05-16 (×3): 2 [IU] via SUBCUTANEOUS
  Administered 2014-05-16 – 2014-05-17 (×2): 4 [IU] via SUBCUTANEOUS
  Administered 2014-05-17 – 2014-05-20 (×5): 2 [IU] via SUBCUTANEOUS
  Administered 2014-05-21: 4 [IU] via SUBCUTANEOUS
  Administered 2014-05-22 – 2014-05-24 (×3): 2 [IU] via SUBCUTANEOUS

## 2014-05-15 MED ORDER — PANTOPRAZOLE SODIUM 40 MG PO TBEC: 40.0000 mg | DELAYED_RELEASE_TABLET | Freq: Every day | ORAL | Status: DC

## 2014-05-15 MED ORDER — ASPIRIN EC 325 MG PO TBEC
325.0000 mg | DELAYED_RELEASE_TABLET | Freq: Every day | ORAL | Status: DC
  Administered 2014-05-16 – 2014-05-25 (×10): 325 mg via ORAL
  Filled 2014-05-15 (×11): qty 1

## 2014-05-15 MED ORDER — TRAMADOL HCL 50 MG PO TABS: 50.0000 mg | ORAL_TABLET | ORAL | Status: DC | PRN

## 2014-05-15 MED ORDER — FLEET ENEMA 7-19 GM/118ML RE ENEM
1.0000 | ENEMA | Freq: Once | RECTAL | Status: AC
  Administered 2014-05-15: 1 via RECTAL
  Filled 2014-05-15: qty 1

## 2014-05-15 MED ORDER — ENOXAPARIN SODIUM 40 MG/0.4ML ~~LOC~~ SOLN
40.0000 mg | SUBCUTANEOUS | Status: DC
  Filled 2014-05-15: qty 0.4

## 2014-05-15 MED ORDER — CARVEDILOL 3.125 MG PO TABS: 3.1250 mg | ORAL_TABLET | Freq: Two times a day (BID) | ORAL | Status: DC

## 2014-05-15 MED ORDER — ACETAMINOPHEN 325 MG PO TABS
325.0000 mg | ORAL_TABLET | ORAL | Status: DC | PRN
  Administered 2014-05-17 – 2014-05-18 (×2): 650 mg via ORAL
  Filled 2014-05-15 (×2): qty 2

## 2014-05-15 MED ORDER — SENNOSIDES-DOCUSATE SODIUM 8.6-50 MG PO TABS
2.0000 | ORAL_TABLET | Freq: Every day | ORAL | Status: DC
  Administered 2014-05-15 – 2014-05-24 (×9): 2 via ORAL
  Filled 2014-05-15 (×14): qty 2

## 2014-05-15 MED ORDER — PROCHLORPERAZINE MALEATE 5 MG PO TABS
5.0000 mg | ORAL_TABLET | Freq: Four times a day (QID) | ORAL | Status: DC | PRN
  Filled 2014-05-15: qty 2

## 2014-05-15 MED ORDER — TAMSULOSIN HCL 0.4 MG PO CAPS
0.4000 mg | ORAL_CAPSULE | Freq: Every day | ORAL | Status: DC
  Administered 2014-05-16: 0.4 mg via ORAL
  Filled 2014-05-15 (×2): qty 1

## 2014-05-15 MED ORDER — GLYBURIDE 5 MG PO TABS: 15.0000 mg | ORAL_TABLET | Freq: Every day | ORAL | Status: DC

## 2014-05-15 MED ORDER — PANTOPRAZOLE SODIUM 40 MG PO TBEC
40.0000 mg | DELAYED_RELEASE_TABLET | Freq: Every day | ORAL | Status: DC
  Administered 2014-05-16 – 2014-05-25 (×10): 40 mg via ORAL
  Filled 2014-05-15 (×15): qty 1

## 2014-05-15 MED ORDER — GLIMEPIRIDE 2 MG PO TABS
2.0000 mg | ORAL_TABLET | Freq: Every day | ORAL | Status: DC
  Administered 2014-05-16 – 2014-05-25 (×10): 2 mg via ORAL
  Filled 2014-05-15 (×12): qty 1

## 2014-05-15 MED ORDER — GUAIFENESIN-DM 100-10 MG/5ML PO SYRP
5.0000 mL | ORAL_SOLUTION | Freq: Four times a day (QID) | ORAL | Status: DC | PRN
Start: 2014-05-15 — End: 2014-05-25

## 2014-05-15 MED ORDER — ACETAMINOPHEN 325 MG PO TABS: 650.0000 mg | ORAL_TABLET | Freq: Four times a day (QID) | ORAL | Status: DC | PRN

## 2014-05-15 MED ORDER — BISACODYL 10 MG RE SUPP: 10.0000 mg | Freq: Every day | RECTAL | Status: DC | PRN

## 2014-05-15 MED ORDER — ASPIRIN 325 MG PO TBEC: 325.0000 mg | DELAYED_RELEASE_TABLET | Freq: Every day | ORAL | Status: DC

## 2014-05-15 MED ORDER — GABAPENTIN 100 MG PO CAPS
100.0000 mg | ORAL_CAPSULE | Freq: Two times a day (BID) | ORAL | Status: DC
  Administered 2014-05-15 – 2014-05-25 (×20): 100 mg via ORAL
  Filled 2014-05-15 (×22): qty 1

## 2014-05-15 MED ORDER — CARVEDILOL 3.125 MG PO TABS
3.1250 mg | ORAL_TABLET | Freq: Two times a day (BID) | ORAL | Status: DC
  Administered 2014-05-15 – 2014-05-25 (×19): 3.125 mg via ORAL
  Filled 2014-05-15 (×22): qty 1

## 2014-05-15 MED ORDER — DIPHENHYDRAMINE HCL 12.5 MG/5ML PO ELIX: 12.5000 mg | ORAL_SOLUTION | Freq: Four times a day (QID) | ORAL | Status: DC | PRN

## 2014-05-15 MED ORDER — ATORVASTATIN CALCIUM 40 MG PO TABS
40.0000 mg | ORAL_TABLET | Freq: Every day | ORAL | Status: DC
  Administered 2014-05-15 – 2014-05-24 (×10): 40 mg via ORAL
  Filled 2014-05-15 (×11): qty 1

## 2014-05-15 MED ORDER — INSULIN DETEMIR 100 UNIT/ML ~~LOC~~ SOLN
10.0000 [IU] | Freq: Every day | SUBCUTANEOUS | Status: AC
  Administered 2014-05-16 – 2014-05-18 (×3): 10 [IU] via SUBCUTANEOUS
  Filled 2014-05-15 (×3): qty 0.1

## 2014-05-15 MED ORDER — ATORVASTATIN CALCIUM 40 MG PO TABS: 40.0000 mg | ORAL_TABLET | Freq: Every day | ORAL | Status: DC

## 2014-05-15 MED ORDER — PROCHLORPERAZINE EDISYLATE 5 MG/ML IJ SOLN
5.0000 mg | Freq: Four times a day (QID) | INTRAMUSCULAR | Status: DC | PRN
  Filled 2014-05-15: qty 2

## 2014-05-15 MED ORDER — PROCHLORPERAZINE 25 MG RE SUPP
12.5000 mg | Freq: Four times a day (QID) | RECTAL | Status: DC | PRN
  Filled 2014-05-15: qty 1

## 2014-05-15 MED ORDER — ALUM & MAG HYDROXIDE-SIMETH 200-200-20 MG/5ML PO SUSP: 30.0000 mL | ORAL | Status: DC | PRN

## 2014-05-15 MED ORDER — OXYCODONE HCL 5 MG PO TABS
5.0000 mg | ORAL_TABLET | ORAL | Status: DC | PRN
  Administered 2014-05-15 – 2014-05-24 (×10): 10 mg via ORAL
  Filled 2014-05-15 (×10): qty 2

## 2014-05-15 MED ORDER — TRAZODONE HCL 50 MG PO TABS
25.0000 mg | ORAL_TABLET | Freq: Every evening | ORAL | Status: DC | PRN
Start: 2014-05-15 — End: 2014-05-25

## 2014-05-15 NOTE — Progress Notes (Addendum)
Update: Patient received approval for CIR. CSW signing off at this time as there are no longer social work needs.  CSW spoke to patient and patient's wife over the phone. Patient and wife stated that if patient is not accepted to CIR, they would want to go home with home health rather than go to SNF. Patient states he thought about SNF and does not want to do that route. Patient's wife asked social worker if patient went home, if EMS could transfer him home to make it easy for patient to get up the steps. CSW explained that social worker can set up PTAR non emergency but explained that it will be billed to insurance. Wife and patient verbalized their understanding. Case Manager made aware of possible Home with Home Health.  Jeanette Caprice, MSW, [place]

## 2014-05-15 NOTE — Progress Notes (Signed)
Physical Medicine and Rehabilitation Consult  Reason for Consult: Deconditioning after CABG/multi-medical  Referring Physician: Dr. Cyndia Bent  HPI: [name] [name] is a 53 y.o. right hand male with history of diabetes mellitus, hypertension. Patient with complicated medical history who underwent surgery on his left foot in September of 2014 for nonhealing wound he reported having MRSA osteomyelitis and multiple toes amputated. Postoperatively shortness of breath pulmonary edema leading to a cardiac workup at [facility] echocardiogram showing ejection fraction of 15%. Hospital course at that time complicated by renal failure felt he had nephrotoxicity due to a antibiotic. He was discharged to home he had undergone an external defibrillator fall per cardiology services with the fibulae are later removed. A stress Cardiolite study showed fixed inferior and reversible lateral defect with ejection fraction of 40% in March of 2015. Presented 05/07/2014 with increasing shortness of breath findings of noted multi-vessel coronary artery disease and underwent CABG x4 05/07/2014 per Dr. Cyndia Bent. Postoperative pain management as well as acute blood loss anemia 7.5 with latest hemoglobin 8.5. Wound care nurse for chronic foot wound and had been followed by Dr. Vickki Muff in [place]. Dressing changes advised the left foot and monitored closely. Placed on Lovenox for DVT prophylaxis. Physical and occupational therapy evaluations completed and ongoing recommendations for physical medicine rehabilitation consult.  Review of Systems  Respiratory: Positive for shortness of breath.  Cardiovascular: Positive for leg swelling.  Gastrointestinal: Positive for constipation.  Neurological: Positive for weakness.  All other systems reviewed and are negative.   Past Medical History   Diagnosis  Date   .  CAD (coronary artery disease)    .  Cardiomyopathy    .  Chest pain    .  Congestive heart failure    .  Diabetes     .  Dyspnea    .  Hypertension    .  Peripheral vascular disease    .  Diabetic foot ulcers      left foot 2nd digit ant 5 th digit 05/03/14   .  Hypercholesterolemia    .  Myocardial infarction    .  Chronic kidney disease    .  Neuropathy in diabetes     Past Surgical History   Procedure  Laterality  Date   .  Left foot osteomyelitis and wound after surgery     .  Tonsilectomy/adenoidectomy with myringotomy     .  Cataract extraction     .  Toe amputation       Left 3 and 4 toes   .  Cardiac catheterization       03/2014   .  Coronary artery bypass graft  N/A  05/07/2014     Procedure: CORONARY ARTERY BYPASS GRAFTING (CABG); Surgeon: Gaye Pollack, MD; Location: [place] [facility]; Service: Open Heart Surgery; Laterality: N/A; Times 4 using left internal mammary artery and endoscopically harvested right saphenous vein   .  Intraoperative transesophageal echocardiogram  N/A  05/07/2014     Procedure: INTRAOPERATIVE TRANSESOPHAGEAL ECHOCARDIOGRAM; Surgeon: Gaye Pollack, MD; Location: [facility] OR; Service: Open Heart Surgery; Laterality: N/A;    Family History   Problem  Relation  Age of Onset   .  Heart disease  Father      CABG in his 70's   .  Diabetes type II     .  Kidney disease     .  Hypertension      Social History: reports that he has never smoked. He has never used  smokeless tobacco. He reports that he does not drink alcohol or use illicit drugs.  Allergies:  Allergies   Allergen  Reactions   .  Other      Pt. And wife state that he had a reaction to "some" antibiotic but they do not know what the name of it was.They are are very apprehensive about him receiving any antibiotic.    Medications Prior to Admission   Medication  Sig  Dispense  Refill   .  carvedilol (COREG) 25 MG tablet  Take 25 mg by mouth 2 (two) times daily with a meal.     .  gabapentin (NEURONTIN) 100 MG capsule  Take 100 mg by mouth 2 (two) times daily.     Marland Kitchen  glyBURIDE (DIABETA) 5 MG tablet  Take 15 mg by mouth  daily with breakfast.     .  hydrALAZINE (APRESOLINE) 25 MG tablet  Take 12.5 mg by mouth 2 (two) times daily.     .  insulin aspart (NOVOLOG FLEXPEN) 100 UNIT/ML FlexPen  Inject 4-8 Units into the skin See admin instructions. Sliding scale     .  isosorbide mononitrate (IMDUR) 30 MG 24 hr tablet  Take 30 mg by mouth at bedtime.      Home:  Home Living  Family/patient expects to be discharged to:: Private residence  Living Arrangements: Spouse/significant other;Children  Available Help at Discharge: Family;Available 24 hours/day  Type of Home: House  Home Access: Stairs to enter  [employer] of Steps: 5  Entrance Stairs-Rails: Right;Left  Home Layout: One level  Home Equipment: Environmental consultant - 2 wheels  Functional History:  Prior Function  Level of Independence: Independent with assistive device(s)  Comments: Rw prn; limited household distance amb  Functional Status:  Mobility:  Bed Mobility  Overal bed mobility: Needs Assistance;+2 for physical assistance  Bed Mobility: Supine to Sit  Supine to sit: +2 for physical assistance;Max assist  Transfers  Overall transfer level: Needs assistance  Equipment used: 2 person hand held assist (stedy)  Transfers: Sit to/from Stand  Sit to Stand: +2 physical assistance;Mod assist  General transfer comment: PT is able to power up into standing but upon standing pushing LT. Pt cued to maintain center of gravity and bil LE buckle. pt with bil LE blocked and upright posture and immediate posterior lean  Ambulation/Gait  General Gait Details: Attempted march in place with Bil support on locked wheelchair in front of him, however unable due to weakness and bil knee buckling   ADL:  ADL  Overall ADL's : Needs assistance/impaired  Upper Body Dressing : Maximal assistance;Sitting  Lower Body Dressing: +2 for physical assistance;Maximal assistance;Bed level  Toilet Transfer: +2 for physical assistance;Total assistance (stedy)  General ADL  Comments: Pt completed supine <> sit and with mod cues for sternal precautions. pt at EOB unable to maintain static sitting initially. OT sitting next to patient and asked to sit shoulder to shoulder. Pt was able to use this visual and tactile cue to maintain static sitting. pt with neck flexion and hip fleixon in this position. pt cued for upright posterior and immediate posterior lean. Pt unable to maintain upright posture and Rt LE on floor. pt required use of stedy for safety. pt with strong LT lean  Cognition:  Cognition  Overall Cognitive Status: Within Functional Limits for tasks assessed  Orientation Level: Oriented X4  Cognition  Arousal/Alertness: Awake/alert  Behavior During Therapy: WFL for tasks assessed/performed  Overall Cognitive Status: Within Functional Limits  for tasks assessed  Blood pressure 99/75, pulse 81, temperature 98.4 F (36.9 C), temperature source Oral, resp. rate 14, height 6\' 1"  (1.854 m), weight 106.6 kg (235 lb 0.2 oz), SpO2 96.00%.  Physical Exam  Vitals reviewed.  Constitutional: He is oriented to person, place, and time.  HENT:  Head: Normocephalic.  Eyes: EOM are normal.  Neck: Normal range of motion. Neck supple. No thyromegaly present.  Cardiovascular: Normal rate and regular rhythm.  Respiratory: Effort normal and breath sounds normal. No respiratory distress.  GI: Soft. He exhibits no distension.  Neurological: He is alert and oriented to person, place, and time.  Follows commands. UE's 4/5 prox to distal. LLE 2/5 HF, KE, foot, RLE 3/5 HF, KE and foot. Decreased PP and LT in both feet and fingers.  Skin:  Chest incision healing nicely. Left foot wound is dressed without odor, chronic scarring, toe amps noted   Results for orders placed during the hospital encounter of 05/07/14 (from the past 24 hour(s))   GLUCOSE, CAPILLARY Status: Abnormal    Collection Time    05/10/14 11:54 AM   Result  Value  Ref Range    Glucose-Capillary  125 (*)  70 - 99  mg/dL    Comment 1  Documented in Chart     Comment 2  Notify RN    GLUCOSE, CAPILLARY Status: None    Collection Time    05/10/14 3:54 PM   Result  Value  Ref Range    Glucose-Capillary  91  70 - 99 mg/dL    Comment 1  Documented in Chart     Comment 2  Notify RN    GLUCOSE, CAPILLARY Status: Abnormal    Collection Time    05/10/14 7:41 PM   Result  Value  Ref Range    Glucose-Capillary  125 (*)  70 - 99 mg/dL    Comment 1  Documented in Chart     Comment 2  Notify RN    GLUCOSE, CAPILLARY Status: None    Collection Time    05/10/14 11:33 PM   Result  Value  Ref Range    Glucose-Capillary  74  70 - 99 mg/dL    Comment 1  Documented in Chart     Comment 2  Notify RN    GLUCOSE, CAPILLARY Status: None    Collection Time    05/11/14 3:43 AM   Result  Value  Ref Range    Glucose-Capillary  73  70 - 99 mg/dL    Comment 1  Documented in Chart     Comment 2  Notify RN    BASIC METABOLIC PANEL Status: Abnormal    Collection Time    05/11/14 3:45 AM   Result  Value  Ref Range    Sodium  137  137 - 147 mEq/L    Potassium  4.1  3.7 - 5.3 mEq/L    Chloride  99  96 - 112 mEq/L    CO2  26  19 - 32 mEq/L    Glucose, Bld  70  70 - 99 mg/dL    BUN  35 (*)  6 - 23 mg/dL    Creatinine, Ser  1.57 (*)  0.50 - 1.35 mg/dL    Calcium  8.4  8.4 - 10.5 mg/dL    GFR calc non Af Amer  49 (*)  >90 mL/min    GFR calc Af Amer  57 (*)  >90 mL/min   CBC Status: Abnormal  Collection Time    05/11/14 3:45 AM   Result  Value  Ref Range    WBC  7.2  4.0 - 10.5 K/uL    RBC  2.81 (*)  4.22 - 5.81 MIL/uL    Hemoglobin  8.5 (*)  13.0 - 17.0 g/dL    HCT  25.0 (*)  39.0 - 52.0 %    MCV  89.0  78.0 - 100.0 fL    MCH  30.2  26.0 - 34.0 pg    MCHC  34.0  30.0 - 36.0 g/dL    RDW  13.3  11.5 - 15.5 %    Platelets  176  150 - 400 K/uL   GLUCOSE, CAPILLARY Status: None    Collection Time    05/11/14 7:44 AM   Result  Value  Ref Range    Glucose-Capillary  76  70 - 99 mg/dL    Dg Chest Port 1 View   05/10/2014 CLINICAL DATA: Status post cardiac surgery EXAM: PORTABLE CHEST - 1 VIEW COMPARISON: 05/09/2014 FINDINGS: A right jugular sheath remains in satisfactory position. Postsurgical changes are noted. The cardiac shadow is stable. The overall inspiratory effort is poor. Persistent left basilar atelectasis is noted. No bony abnormality is noted. IMPRESSION: Persistent left basilar atelectasis. Electronically Signed By: Inez Catalina M.D. On: 05/10/2014 08:11   Assessment/Plan:  Diagnosis: deconditioning after CABG and multiple medical.  1. Does the need for close, 24 hr/day medical supervision in concert with the patient's rehab needs make it unreasonable for this patient to be served in a less intensive setting? Yes 2. Co-Morbidities requiring supervision/potential complications: dm with DPN, toe amps left foot/wound 3. Due to bladder management, bowel management, safety, skin/wound care, disease management, medication administration, pain management and patient education, does the patient require 24 hr/day rehab nursing? Yes 4. Does the patient require coordinated care of a physician, rehab nurse, PT (1-2 hrs/day, 5 days/week) and OT (1-2 hrs/day, 5 days/week) to address physical and functional deficits in the context of the above medical diagnosis(es)? Yes Addressing deficits in the following areas: balance, endurance, locomotion, strength, transferring, bowel/bladder control, bathing, dressing, feeding, grooming, toileting and psychosocial support 5. Can the patient actively participate in an intensive therapy program of at least 3 hrs of therapy per day at least 5 days per week? Yes 6. The potential for patient to make measurable gains while on inpatient rehab is excellent 7. Anticipated functional outcomes upon discharge from inpatient rehab are modified independent and supervision with PT, modified independent and supervision with OT, n/a with SLP. 8. Estimated rehab length of stay to reach the  above functional goals is: 10-14 days 9. Does the patient have adequate social supports to accommodate these discharge functional goals? Yes 10. Anticipated D/C setting: Home 11. Anticipated post D/C treatments: [place] therapy 12. Overall Rehab/Functional Prognosis: excellent RECOMMENDATIONS:  This patient's condition is appropriate for continued rehabilitative care in the following setting: CIR  Patient has agreed to participate in recommended program. Yes  Note that insurance prior authorization may be required for reimbursement for recommended care.  Comment: Rehab Admissions Coordinator to follow up.  Thanks,  Meredith Staggers, MD, Mellody Drown  05/11/2014

## 2014-05-15 NOTE — Progress Notes (Signed)
[id number] Education completed with pt who voiced understanding. Encouraged IS. Encouraged walking with assistance only. Discussed CRP 2 and pt agreed to sending letter of interest to [facility] Phase 2.  Discussed watching carbs. Gave heart healthy and diabetic diets. Graylon Good RN BSN 05/15/2014 1:42 PM

## 2014-05-15 NOTE — Progress Notes (Signed)
CARDIAC REHAB PHASE I   PRE:  Rate/Rhythm: 80 SR    BP: sitting 135/74    SaO2: 95 RA  MODE:  Ambulation: 300 ft   POST:  Rate/Rhythm: 90 SR    BP: sitting 139/75     SaO2: 97 RA  Pt able to stand independently from raised bed. Used RW, gait belt, assist x2. Continues to be unsteady due to weak legs/hips/core. Pt very motivated and wanted to increase distance this am. Rest x1 to conserve energy. To recliner with lack of control descending. Fatigued after walk but pt content. VSS. Will continue to follow. [id number]   Josephina Shih [place] CES, ACSM 05/15/2014 8:47 AM

## 2014-05-15 NOTE — Progress Notes (Signed)
Insurance has approved inpt rehab admission. Pt is aware and in agreement. I will arrange for today. [id number]

## 2014-05-15 NOTE — Progress Notes (Signed)
Patient information reviewed and entered into eRehab system by Jheremy Boger, RN, CRRN, PPS Coordinator.  Information including medical coding and functional independence measure will be reviewed and updated through discharge.    

## 2014-05-15 NOTE — Progress Notes (Signed)
05/15/2014 3:33 PM Nursing note Report called to [place]. Questions and concerns addressed. D/c iv line by NT. D/c tele. D/C IP rehab per orders. Patient family at bedside and updated on plan of care.  Flippin, Arville Lime

## 2014-05-15 NOTE — H&P (Signed)
Physical Medicine and Rehabilitation Admission H&P  CC: Deconditioning past CABG  HPI: [name] [name] is a 53 y.o. 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 Hospital course at that time complicated by AKI due to antibiotics as well as SOB post op and was found to have EF 15% and defibrillator placed prior to d/c. He had a stress cardiolyte in March 2015 showing a fixed inferior and reversible lateral defect with an EF of 40% and had improvement in EF and ICD removed. He has had intermittent chest discomfort as well as dizziness and falls question due to medication SE. Cardiac cath at [facility] revealed severe multivessel disease and he was admitted on 05/07/14 for CABG by Dr. Cyndia Bent. Post op pulmonary edema treated with diuresis and ABLA being monitored. DARCO shoe ordered for left foot and ailver Hydrofiber for hyperkeratotic periwound with minimal drainage. Foley placed due to urinary retention and voiding trial initiated on 05/14/14 but foley replaced due to inabiltiyt to urinate. CXR with small bilateral pleural effusions and small PTX--IS encouraged. Therapy ongoing and patient noted to be deconditioned due to multiple medical issues as well as limited due to sternal precautions. CIR recommended by rehab team and patient admitted today.   Patient has been bed bound for the last 6 months according to his wife. Mainly related to his cardiac issues. Chest pain postop as well controlled.  Review of Systems  HENT: Negative for hearing loss.  Eyes: Positive for blurred vision (when out in bright sun).  Respiratory: Negative for cough, sputum production and shortness of breath.  Cardiovascular: Negative for chest pain, palpitations and leg swelling.  Gastrointestinal: Positive for constipation (No BM X 5 days).  Negative for heartburn and nausea.  Genitourinary:  H/o weak stream  Musculoskeletal: Negative for back pain and myalgias.  Neurological: Positive for dizziness (with increase in activity level) and weakness. Negative for headaches.  Psychiatric/Behavioral: The patient is nervous/anxious and has insomnia.     Past Medical History    Diagnosis  Date    .  CAD (coronary artery disease)     .  Cardiomyopathy     .  Chest pain     .  Congestive heart failure     .  Diabetes     .  Dyspnea     .  Hypertension     .  Peripheral vascular disease     .  Diabetic foot ulcers       left foot 2nd digit ant 5 th digit 05/03/14    .  Hypercholesterolemia     .  Myocardial infarction     .  Chronic kidney disease     .  Neuropathy in diabetes        Past Surgical History    Procedure  Laterality  Date    .  Left foot osteomyelitis and wound after surgery      .  Tonsilectomy/adenoidectomy with myringotomy      .  Cataract extraction      .  Toe amputation        Left 3 and 4 toes    .  Cardiac catheterization        03/2014    .  Coronary artery bypass graft  N/A  05/07/2014      Procedure: CORONARY ARTERY BYPASS  GRAFTING (CABG); Surgeon: Gaye Pollack, MD; Location: [place]; Service: Open Heart Surgery; Laterality: N/A; Times 4 using left internal mammary artery and endoscopically harvested right saphenous vein    .  Intraoperative transesophageal echocardiogram  N/A  05/07/2014      Procedure: INTRAOPERATIVE TRANSESOPHAGEAL ECHOCARDIOGRAM; Surgeon: Gaye Pollack, MD; Location: [facility] OR; Service: Open Heart Surgery; Laterality: N/A;       Family History    Problem  Relation  Age of Onset    .  Heart disease  Father       CABG in his 67's    .  Diabetes type II      .  Kidney disease      .  Hypertension       Social History: reports that he has never smoked. He has never used smokeless tobacco. He reports that he does not drink alcohol or use illicit drugs.    Allergies    Allergen   Reactions    .  Other       Pt. And wife state that he had a reaction to "some" antibiotic but they do not know what the name of it was.They are are very apprehensive about him receiving any antibiotic.       Medications Prior to Admission    Medication  Sig  Dispense  Refill    .  gabapentin (NEURONTIN) 100 MG capsule  Take 100 mg by mouth 2 (two) times daily.      .  hydrALAZINE (APRESOLINE) 25 MG tablet  Take 12.5 mg by mouth 2 (two) times daily.      .  insulin aspart (NOVOLOG FLEXPEN) 100 UNIT/ML FlexPen  Inject 4-8 Units into the skin See admin instructions. Sliding scale      .  isosorbide mononitrate (IMDUR) 30 MG 24 hr tablet  Take 30 mg by mouth at bedtime.      .  [DISCONTINUED] carvedilol (COREG) 25 MG tablet  Take 25 mg by mouth 2 (two) times daily with a meal.      .  [DISCONTINUED] glyBURIDE (DIABETA) 5 MG tablet  Take 15 mg by mouth daily with breakfast.       Home:  Home Living  Family/patient expects to be discharged to:: Private residence  Living Arrangements: Other (Comment) (wife's 60 year old son living with pt./wife this summer)  Available Help at Discharge: Other (Comment) (wife is a Pharmacist, hospital and off from work for the summer)  Type of Home: House  Home Access: Stairs to enter  Technical brewer of Steps: 5  Entrance Stairs-Rails: [place]: One [place]: [place] - 2 wheels  Additional Comments: Not currently; Pt. had Garceno for wound vac 11/14 to 2/15 for foot ulcer per pt.  Functional History:  Prior Function  Level of Independence: Independent with assistive device(s)  Comments: Pt reports he gets out in the yard most days but has recently been limited by dizziness  Functional Status:  Mobility:  Bed Mobility  Overal bed mobility: Needs Assistance  Bed Mobility: Supine to Sit;Sit to Supine  Supine to sit: Mod assist;HOB elevated  Sit to supine: Min guard  General bed mobility comments: pt requires (A) from [place] 20 degrees to  achieve upright posture EOB  Transfers  Overall transfer level: Needs assistance  Equipment used: Rolling walker (2 wheeled)  Transfers: Sit to/from Stand  Sit to Stand: Mod assist;From elevated surface  Stand pivot transfers: +2 physical assistance;Mod assist  General  transfer comment: Assist needed to bring hips up. Pt used hands on knees for sternal precautions. Took several attempts on 2nd attempt at sit to stand. Pt just did not have the strength to push up and lift hips off chair. Ended up helping a lot more.  Ambulation/Gait  Ambulation/Gait assistance: Min assist  Ambulation Distance (Feet): 145 Feet  Assistive device: Rolling walker (2 wheeled)  Gait Pattern/deviations: Step-through pattern;Decreased step length - right;Decreased step length - left;Narrow base of support;Decreased weight shift to left  Gait velocity: decr  Gait velocity interpretation: Below normal speed for age/gender  General Gait Details: Pt initially did well with ambulation. As he fatigued, pt began to have decr left knee control as well as leaning a little more on his hands/arms. Pt rested and exercised and then walked to sink and brushed teeth. On the way back to the bed, pt lost balance needing mod assist to regain control of balance.   ADL:  ADL  Overall ADL's : Needs assistance/impaired  Eating/Feeding: Modified independent;Bed level  Grooming: Wash/dry hands;Min guard  Grooming Details (indicate cue type and reason): pt very unsteady with bil ue hand use. pt reports feeling unsteady. pt placing hand on counter and alternating due to balance deficits  Upper Body Dressing : Maximal assistance;Sitting  Lower Body Dressing: Moderate assistance;Sit to/from stand  Lower Body Dressing Details (indicate cue type and reason): requires AE reacher to achieve higher level of dressing. Pt is able to cross bil LE to hook feet into pants/ socks, shoes. Pt don shoe supervision  Toilet Transfer: Total assistance;RW   Toilet Transfer Details (indicate cue type and reason): pt can not achieve sit<>Stand from standard height. Pt required bed elevated so that hips are higher than knee to achieve sit<>stand mod (A) maintaining sternal precautions  Functional mobility during ADLs: Minimal assistance;Rolling walker  General ADL Comments: Pt fatigued s/p PT session and had returned supine. pt agreeable to OOB and participation. Pt demonstrates deficits with sit<>Stand and unable to achieve from standard height. pt with posterior unsteady lean with RW use. pt needed education on RW safety wtih adls.  Cognition:  Cognition  Overall Cognitive Status: Within Functional Limits for tasks assessed  Orientation Level: Oriented X4  Cognition  Arousal/Alertness: Awake/alert  Behavior During Therapy: WFL for tasks assessed/performed  Overall Cognitive Status: Within Functional Limits for tasks assessed  Physical Exam:  Blood pressure 115/64, pulse 75, temperature 98.5 F (36.9 C), temperature source Oral, resp. rate 19, height 6' 1" (1.854 m), weight 102.5 kg (225 lb 15.5 oz), SpO2 94.00%.  Physical Exam  Nursing note and vitals reviewed.  Constitutional: He is oriented to person, place, and time. He appears well-developed and well-nourished.  HENT:  Head: Normocephalic and atraumatic.  Eyes: Conjunctivae are normal. Pupils are equal, round, and reactive to light.  Neck: Normal range of motion. Neck supple.  Cardiovascular: Normal rate and regular rhythm.  Murmur heard.  Respiratory: Effort normal. No accessory muscle usage. No respiratory distress. He has decreased breath sounds in the left lower field. He has no wheezes.  GI: Soft. Bowel sounds are normal. He exhibits no distension. There is no tenderness.  Musculoskeletal:  Left foot with well healed incision on surface and small callus lateral to 5th toes. S/p 3rd and 4th toe amputation. Left 2nd toe with dime sized diabetic ulcer (on plantar surface) with central  hole packed with dressing and keratotic lesion on top.  Neurological: He is alert and oriented to person, place, and time.  Speech soft but clear. Follows commands without difficulty. BUE's 4/5 prox to distal. LLE 2/5 HF, KE, foot, RLE 3/5 HF, KE and foot. Decreased PP and LT in both feet and fingers.  Skin: Skin is warm and dry.  RLE graft incisions with skin glue.     Results for orders placed during the hospital encounter of 05/07/14 (from the past 48 hour(s))    GLUCOSE, CAPILLARY Status: Abnormal     Collection Time     05/13/14 4:33 PM    Result  Value  Ref Range     Glucose-Capillary  154 (*)  70 - 99 mg/dL     Comment 1  Documented in Chart      Comment 2  Notify RN     GLUCOSE, CAPILLARY Status: Abnormal     Collection Time     05/13/14 9:17 PM    Result  Value  Ref Range     Glucose-Capillary  127 (*)  70 - 99 mg/dL    CBC Status: Abnormal     Collection Time     05/14/14 3:30 AM    Result  Value  Ref Range     WBC  5.8  4.0 - 10.5 K/uL     RBC  3.05 (*)  4.22 - 5.81 MIL/uL     Hemoglobin  9.1 (*)  13.0 - 17.0 g/dL     HCT  27.6 (*)  39.0 - 52.0 %     MCV  90.5  78.0 - 100.0 fL     MCH  29.8  26.0 - 34.0 pg     MCHC  33.0  30.0 - 36.0 g/dL     RDW  12.8  11.5 - 15.5 %     Platelets  307  150 - 400 K/uL    BASIC METABOLIC PANEL Status: Abnormal     Collection Time     05/14/14 3:30 AM    Result  Value  Ref Range     Sodium  140  137 - 147 mEq/L     Potassium  5.3  3.7 - 5.3 mEq/L     Chloride  100  96 - 112 mEq/L     CO2  29  19 - 32 mEq/L     Glucose, Bld  100 (*)  70 - 99 mg/dL     BUN  33 (*)  6 - 23 mg/dL     Creatinine, Ser  1.55 (*)  0.50 - 1.35 mg/dL     Calcium  9.1  8.4 - 10.5 mg/dL     GFR calc non Af Amer  50 (*)  >90 mL/min     GFR calc Af Amer  58 (*)  >90 mL/min     Comment:  (NOTE)      The eGFR has been calculated using the CKD EPI equation.      This calculation has not been validated in all clinical situations.      eGFR's persistently <90  mL/min signify possible Chronic Kidney      Disease.    GLUCOSE, CAPILLARY Status: Abnormal     Collection Time     05/14/14 6:25 AM    Result  Value  Ref Range     Glucose-Capillary  109 (*)  70 - 99 mg/dL    GLUCOSE, CAPILLARY Status: Abnormal     Collection Time     05/14/14 11:47 AM    Result  Value  Ref Range  Glucose-Capillary  139 (*)  70 - 99 mg/dL    GLUCOSE, CAPILLARY Status: Abnormal     Collection Time     05/14/14 4:09 PM    Result  Value  Ref Range     Glucose-Capillary  177 (*)  70 - 99 mg/dL    GLUCOSE, CAPILLARY Status: Abnormal     Collection Time     05/14/14 9:04 PM    Result  Value  Ref Range     Glucose-Capillary  118 (*)  70 - 99 mg/dL    BASIC METABOLIC PANEL Status: Abnormal     Collection Time     05/15/14 3:36 AM    Result  Value  Ref Range     Sodium  140  137 - 147 mEq/L     Potassium  4.6  3.7 - 5.3 mEq/L     Chloride  100  96 - 112 mEq/L     CO2  27  19 - 32 mEq/L     Glucose, Bld  100 (*)  70 - 99 mg/dL     BUN  33 (*)  6 - 23 mg/dL     Creatinine, Ser  1.42 (*)  0.50 - 1.35 mg/dL     Calcium  8.9  8.4 - 10.5 mg/dL     GFR calc non Af Amer  56 (*)  >90 mL/min     GFR calc Af Amer  65 (*)  >90 mL/min     Comment:  (NOTE)      The eGFR has been calculated using the CKD EPI equation.      This calculation has not been validated in all clinical situations.      eGFR's persistently <90 mL/min signify possible Chronic Kidney      Disease.    GLUCOSE, CAPILLARY Status: None     Collection Time     05/15/14 6:49 AM    Result  Value  Ref Range     Glucose-Capillary  97  70 - 99 mg/dL    GLUCOSE, CAPILLARY Status: Abnormal     Collection Time     05/15/14 10:56 AM    Result  Value  Ref Range     Glucose-Capillary  168 (*)  70 - 99 mg/dL     Dg Chest 2 View  05/14/2014 CLINICAL DATA: Coronary artery disease EXAM: CHEST 2 VIEW COMPARISON: May 12, 2014 FINDINGS: There is persistent patchy left lower lobe consolidation with small left  effusion. There is slightly more consolidation in the left base compared to recent prior study. Elsewhere lungs are clear. Heart is mildly enlarged with normal pulmonary vascularity. No pneumothorax. No adenopathy. Patient is status post coronary artery bypass grafting. IMPRESSION: Persistent left base consolidation with small left effusion. Elsewhere lungs clear. There is slightly more left base opacity compared to recent prior study. Electronically Signed By: Lowella Grip M.D. On: 05/14/2014 07:59   Medical Problem List and Plan:  1. Functional deficits secondary to Deconditioning after CABG and multiple medical  2. DVT Prophylaxis/Anticoagulation: Pharmaceutical: Lovenox  3. Pain Management: prn oxycodone is effective.  4. Mood: Has a supportive wife who is off for the summer. LCSW to follow for evaluation and support.  5. Neuropsych: This patient is capable of making decisions on his own behalf.  6. Fluid overload: Low salt diet. Check daily weights as well as close monitoring for signs of overload. Off lasix at this time. 7. DM type 2: Glyburide on hold due to  renal insufficiency---will change to amaryl and wean off levemir. Monitor BS with ac/hs checks and use SSI for elevated BS. Titrate oral medication as indicated.  8. PVD with multiple left foot ulcers: Continue DARCO shoe for pressure relief.  9. ABLA: H/H with slow improvement. Recheck in am. Add iron supplement  10. Acute on CKD: Will continue to monitor renal status for now. Push po fluids. Avoid nephrotoxic medications  11. Urinary retention: Continue foley for now and work on alleviating constipation. Continue flomax. Check UA/UCS.  12. CAD: Continue coreg, lipitor and ASA.  13. Constipation: Will argument bowel regimen. Enema today if no results with am laxatives.  Post Admission Physician Evaluation:  1. Functional deficits secondary to Deconditioning past CABG and multiple medical issues.  2. Patient is admitted to receive  collaborative, interdisciplinary care between the physiatrist, rehab nursing staff, and therapy team. 3. Patient's level of medical complexity and substantial therapy needs in context of that medical necessity cannot be provided at a lesser intensity of care such as a SNF. 4. Patient has experienced substantial functional loss from his/her baseline which was documented above under the "Functional History" and "Functional Status" headings. Judging by the patient's diagnosis, physical exam, and functional history, the patient has potential for functional progress which will result in measurable gains while on inpatient rehab. These gains will be of substantial and practical use upon discharge in facilitating mobility and self-care at the household level. 5. Physiatrist will provide 24 hour management of medical needs as well as oversight of the therapy plan/treatment and provide guidance as appropriate regarding the interaction of the two. 6. 24 hour rehab nursing will assist with bladder management, bowel management, safety, skin/wound care, disease management, medication administration, pain management and patient education and help integrate therapy concepts, techniques,education, etc. 7. PT will assess and treat for/with: pre gait, gait training, endurance , safety, equipment,  upper and lower extremity strengthening and range of motion. Goals are: Sup/Mod I including 5 steps with handrails. 8. OT will assess and treat for/with: ADLs, Cognitive perceptual skills,  upper body strengthening and range of motion , safety, endurance, equipment. Goals are: modified independent/supervision. 9. SLP will assess and treat for/with: NA. Goals are: NA. 10. Case Management and Social Worker will assess and treat for psychological issues and discharge planning. 11. Team conference will be held weekly to assess progress toward goals and to determine barriers to discharge. 12. Patient will receive at least 3 hours of  therapy per day at least 5 days per week. 13. ELOS: 7-10 days  14. Prognosis: excellent Charlett Blake M.D. [facility] FAAPM&R (Sports Med, Neuromuscular Med) Diplomate [place] Board of Electrodiagnostic Med  05/15/2014

## 2014-05-15 NOTE — Progress Notes (Signed)
      [address]       [place] [address]             [phone]        8 Days Post-Op Procedure(s) (LRB): CORONARY ARTERY BYPASS GRAFTING (CABG) (N/A) INTRAOPERATIVE TRANSESOPHAGEAL ECHOCARDIOGRAM (N/A)  Subjective: Patient without complaints.  Objective: Vital signs in last 24 hours: Temp:  [98.5 F (36.9 C)-98.8 F (37.1 C)] 98.5 F (36.9 C) (06/30 0511) Pulse Rate:  [75-88] 75 (06/30 0511) Cardiac Rhythm:  [-]  Resp:  [18-20] 19 (06/30 0511) BP: (103-137)/(54-79) 116/76 mmHg (06/30 0511) SpO2:  [94 %-98 %] 94 % (06/30 0511) Weight:  [225 lb 15.5 oz (102.5 kg)] 225 lb 15.5 oz (102.5 kg) (06/30 0511)  Pre op weight 103 kg Current Weight  05/15/14 225 lb 15.5 oz (102.5 kg)      Intake/Output from previous day: 06/29 0701 - 06/30 0700 In: 603 [P.O.:600; I.V.:3] Out: 550 [Urine:550]   Physical Exam:  Cardiovascular: RRR Pulmonary: Clear to auscultation bilaterally; no rales, wheezes, or rhonchi. Abdomen: Soft, non tender, bowel sounds present. Extremities: No lower extremity edema. Wounds: Clean and dry.  No erythema or signs of infection.  Lab Results: CBC:  Recent Labs  05/13/14 0445 05/14/14 0330  WBC 6.4 5.8  HGB 8.5* 9.1*  HCT 26.5* 27.6*  PLT 267 307   BMET:   Recent Labs  05/14/14 0330 05/15/14 0336  NA 140 140  K 5.3 4.6  CL 100 100  CO2 29 27  GLUCOSE 100* 100*  BUN 33* 33*  CREATININE 1.55* 1.42*  CALCIUM 9.1 8.9    PT/INR:  Lab Results  Component Value Date   INR 1.36 05/07/2014   INR 1.05 05/03/2014   ABG:  INR: Will add last result for INR, ABG once components are confirmed Will add last 4 CBG results once components are confirmed  Assessment/Plan:  1. CV - SR in the 80's. On Coreg 3.125 bid 2.  Pulmonary - Encourage incentive spirometer 3.  Acute blood loss anemia - H and H up to 9.1 and 27.6 4. Urinary retention-Foley reinserted on Saturday and on Flomax.  Voiding trial today. 5. Creatinine  down to 1.42. Creatinine upon admission was 1.62. Likely some underlying renal insufficiency. 6. Potassium down to 4.6 7. DM-CBGs 139/177/118. On Insulin. Glyburide not restarted yet as creatinine still elevated. Pre op HGA1C 6.5 8. Remove EPW 9. Hopefully, to CIR today   ZIMMERMAN,DONIELLE MPA-C 05/15/2014,7:52 AM

## 2014-05-15 NOTE — Progress Notes (Signed)
PMR Admission Coordinator Pre-Admission Assessment  Patient: [name] [name] is an 53 y.o., male  MRN: [id number]  DOB: [birth year]  Height: 6\' 1"  (185.4 cm)  Weight: 102.5 kg (225 lb 15.5 oz)  Insurance Information  HMO: no PPO: yes PCP: no IPA: no 80/20: no OTHER: No  PRIMARY: [employer] [place] PPO Policy#: [id number] Subscriber: pt.  CM Name: Phyllis Ginger Phone#: [phone] Fax#: [phone]  Pre-Cert#: [id number] approved until 7/9. Updates needed by 12 noon on 7/9 or call with d/c date Employer: not employed/Obamacare/[employer]  Benefits: Phone #: [phone] Name: Britni  Eff. Date: 01/14/14 Deduct: $500 Out of Pocket Max: $700 Life Max: none  CIR: 100% SNF: 100%, 60 days  Outpatient: 100% Co-Pay: No copay, no visit limits  Home Health: 100% Co-Pay: no  DME: 100% Co-Pay:  Providers: In network   SECONDARY: none  Medicaid Application Date: Case Manager:  Disability Application Date: Pt. Reports he has applied from disability and has been approved. He is waiting on his first check 06/2014 Case Worker:  Emer[employer] [place]    Name  Relation  Home  Work  [place]  Spouse  [phone]  [phone]       Current Medical History  Patient Admitting Diagnosis: deconditioning after CABG and multiple medical.  History of Present Illness: [name] [name] is a 53 y.o. 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 Hospital course at that time complicated by AKI due to antibiotics as well as SOB post op and was found to have EF 15% and defibrillator placed prior to d/c. He had a stress cardiolyte in March 2015 showing a fixed inferior and reversible lateral defect with an EF of 40% and had improvement in EF and ICD removed. He has had intermittent chest discomfort  as well as dizziness and falls question due to medication SE. Cardiac cath at [facility] revealed severe multivessel disease and he was admitted on 05/07/14 for CABG by Dr. Cyndia Bent. Post op pulmonary edema treated with diuresis and ABLA being monitored. DARCO shoe ordered for left foot and ailver Hydrofiber for hyperkeratotic periwound with minimal drainage. Foley placed due to urinary retention and voiding trial initiated on 05/14/14.Foley removed 05/15/14. CXR with small bilateral pleural effusions and small PTX--IS encouraged. Therapy ongoing and patient noted to be deconditioned due to multiple medical issues.  Creat 1.42 on 05/15/14. Creat 1.62 on admission. Likely some underlying renal insufficiency.  Past Medical History  Past Medical History   Diagnosis  Date   .  CAD (coronary artery disease)    .  Cardiomyopathy    .  Chest pain    .  Congestive heart failure    .  Diabetes    .  Dyspnea    .  Hypertension    .  Peripheral vascular disease    .  Diabetic foot ulcers      left foot 2nd digit ant 5 th digit 05/03/14   .  Hypercholesterolemia    .  Myocardial infarction    .  Chronic kidney disease    .  Neuropathy in diabetes     Family History  family history includes Diabetes type II in an other family member; Heart disease in his father; Hypertension in an other family member; Kidney disease in an other family member.  Prior  Rehab/Hospitalizations: per pt., Oct '14 hospitalization for heart and kidney failure. No IP rehab.  Current Medications  Current facility-administered medications:0.9 % sodium chloride infusion, 250 mL, Intravenous, PRN, Gaye Pollack, MD; acetaminophen (TYLENOL) tablet 650 mg, 650 mg, Oral, Q6H PRN, Gaye Pollack, MD, 650 mg at 05/12/14 1240; aspirin EC tablet 325 mg, 325 mg, Oral, Daily, Gaye Pollack, MD, 325 mg at 05/15/14 0956; atorvastatin (LIPITOR) tablet 40 mg, 40 mg, Oral, q1800, Erin Barrett, PA-C, 40 mg at 05/14/14 1701  bisacodyl (DULCOLAX) EC tablet 10  mg, 10 mg, Oral, Daily PRN, Gaye Pollack, MD; bisacodyl (DULCOLAX) suppository 10 mg, 10 mg, Rectal, Daily PRN, Gaye Pollack, MD, 10 mg at 05/12/14 1441; carvedilol (COREG) tablet 3.125 mg, 3.125 mg, Oral, BID WC, Donielle M Zimmerman, PA-C, 3.125 mg at 05/15/14 [id number]; docusate sodium (COLACE) capsule 200 mg, 200 mg, Oral, Daily, Gaye Pollack, MD, 200 mg at 05/15/14 0956  enoxaparin (LOVENOX) injection 40 mg, 40 mg, Subcutaneous, QHS, Gaye Pollack, MD, 40 mg at 05/14/14 2232; gabapentin (NEURONTIN) capsule 100 mg, 100 mg, Oral, BID, Donielle M Zimmerman, PA-C, 100 mg at 05/15/14 0956; insulin aspart (novoLOG) injection 0-24 Units, 0-24 Units, Subcutaneous, TID AC & HS, Gaye Pollack, MD, 4 Units at 05/15/14 1128  insulin detemir (LEVEMIR) injection 10 Units, 10 Units, Subcutaneous, Daily, Gaye Pollack, MD, 10 Units at 05/15/14 [phone]; oxyCODONE (Oxy IR/ROXICODONE) immediate release tablet 5-10 mg, 5-10 mg, Oral, Q3H PRN, Gaye Pollack, MD, 10 mg at 05/15/14 1001; pantoprazole (PROTONIX) EC tablet 40 mg, 40 mg, Oral, QAC breakfast, Gaye Pollack, MD, 40 mg at 05/15/14 0754  sodium chloride 0.9 % injection 3 mL, 3 mL, Intravenous, Q12H, Gaye Pollack, MD, 3 mL at 05/15/14 1000; sodium chloride 0.9 % injection 3 mL, 3 mL, Intravenous, PRN, Gaye Pollack, MD; tamsulosin (FLOMAX) capsule 0.4 mg, 0.4 mg, Oral, Daily, Erin Barrett, PA-C, 0.4 mg at 05/15/14 [id number]; traMADol (ULTRAM) tablet 50-100 mg, 50-100 mg, Oral, Q4H PRN, Gaye Pollack, MD  Patients Current Diet: carb modified, thin liquids  Precautions / Restrictions  Precautions  Precautions: Sternal;Fall  Precaution Comments: sternal precautions  Other Brace/Splint: flat darco shoe  Restrictions  Weight Bearing Restrictions: No  Other Position/Activity Restrictions: offload lt metatarsals as able  Prior Activity Level  Community (5-7x/wk): Pt. reports he and his wife have one car . When he takes her to work, she will do errands several times  per week. He reports he lost his job in November '14 due to his health issues.  Home Assistive Devices / Equipment  Home Assistive Devices/Equipment: Gilford Rile (specify type);Eyeglasses  Home Equipment: Walker - 2 wheels  Prior Functional Level  Prior Function  Level of Independence: Independent with assistive device(s)  Comments: Pt reports he gets out in the yard most days but has recently been limited by dizziness  Current Functional Level  Cognition  Overall Cognitive Status: Within Functional Limits for tasks assessed  Orientation Level: Oriented X4   Extremity Assessment  (includes Sensation/Coordination)     ADLs  Overall ADL's : Needs assistance/impaired  Eating/Feeding: Modified independent;Bed level  Grooming: Wash/dry hands;Min guard  Grooming Details (indicate cue type and reason): pt very unsteady with bil ue hand use. pt reports feeling unsteady. pt placing hand on counter and alternating due to balance deficits  Upper Body Dressing : Maximal assistance;Sitting  Lower Body Dressing: Moderate assistance;Sit to/from stand  Lower Body Dressing Details (indicate cue type and reason): requires AE  reacher to achieve higher level of dressing. Pt is able to cross bil LE to hook feet into pants/ socks, shoes. Pt don shoe supervision  Toilet Transfer: Total assistance;RW  Toilet Transfer Details (indicate cue type and reason): pt can not achieve sit<>Stand from standard height. Pt required bed elevated so that hips are higher than knee to achieve sit<>stand mod (A) maintaining sternal precautions  Functional mobility during ADLs: Minimal assistance;Rolling walker  General ADL Comments: Pt fatigued s/p PT session and had returned supine. pt agreeable to OOB and participation. Pt demonstrates deficits with sit<>Stand and unable to achieve from standard height. pt with posterior unsteady lean with RW use. pt needed education on RW safety wtih adls.   Mobility  Overal bed mobility: Needs  Assistance  Bed Mobility: Supine to Sit;Sit to Supine  Supine to sit: Mod assist;HOB elevated  Sit to supine: Min guard  General bed mobility comments: pt requires (A) from [place] 20 degrees to achieve upright posture EOB   Transfers  Overall transfer level: Needs assistance  Equipment used: Rolling walker (2 wheeled)  Transfers: Sit to/from Stand  Sit to Stand: Mod assist;From elevated surface  Stand pivot transfers: +2 physical assistance;Mod assist  General transfer comment: Assist needed to bring hips up. Pt used hands on knees for sternal precautions. Took several attempts on 2nd attempt at sit to stand. Pt just did not have the strength to push up and lift hips off chair. Ended up helping a lot more.   Ambulation / Gait / Stairs / Wheelchair Mobility  Ambulation/Gait  Ambulation/Gait assistance: Fish farm manager (Feet): 145 Feet  Assistive device: Rolling walker (2 wheeled)  Gait Pattern/deviations: Step-through pattern;Decreased step length - right;Decreased step length - left;Narrow base of support;Decreased weight shift to left  Gait velocity: decr  Gait velocity interpretation: Below normal speed for age/gender  General Gait Details: Pt initially did well with ambulation. As he fatigued, pt began to have decr left knee control as well as leaning a little more on his hands/arms. Pt rested and exercised and then walked to sink and brushed teeth. On the way back to the bed, pt lost balance needing mod assist to regain control of balance.   Posture / Balance    Special needs/care consideration  BiPAP/CPAP no  CPM no  Continuous Drip IV no  Dialysis no  Life Vest no  Oxygen no  Special Bed no  Trach Size No  Wound Vac (area) no  Skin ulcer 2nd toe left foot  Bowel mgmt: continent  Bladder mgmt: foley for difficulty voiding foley removed 05/15/14.  Diabetic mgmt yes Hgb A1C 6.5   Previous Home Environment  Living Arrangements: Other (Comment) (wife's 51 year old son  living with pt./wife this summer)  Available Help at Discharge: Other (Comment) (wife is a Pharmacist, hospital and off from work for the summer)  Type of Home: [place]: One level  Home Access: Stairs to enter  Entrance Stairs-Rails: Nurse, mental health of Steps: 5  Bathroom Shower/Tub: Tourist information centre manager: [place] Accessibility: Yes  How Accessible: Accessible via [place]: Other (Comment)  Additional Comments: Not currently; Pt. had HHRN for wound vac 11/14 to 2/15 for foot ulcer per pt.  Pt and his wife married since 11/2013. She is a Optometrist. Her Mom is at ALF, her brother at SNF with Hospice so she is feeling overwhelmed.  Discharge Living Setting  Plans for Discharge Living Setting:  Patient's home  Type of Home at Discharge: House  Discharge Home Layout: One level  Discharge Home Access: Stairs to enter  Entrance Stairs-Rails: Right;Left  Entrance Stairs-Number of Steps: 5  Discharge Bathroom Shower/Tub: Walk-in shower  Discharge Bathroom Toilet: Standard  Discharge Bathroom Accessibility: Yes  How Accessible: Accessible via walker  Does the patient have any problems obtaining your medications?: (pt. reports he cant afford his Crestor)  Social/Family/Support Systems  Patient Roles: Spouse;Parent  Anticipated Caregiver: wife, Daschel Larochelle  Anticipated Caregiver's Contact Information: [phone]  Ability/Limitations of Caregiver: wife reports she has back trouble which limits her to lifting less than 10#; She states she needs her husband to be fully independent in getting up and down steps and to the bathroom.  Caregiver Availability: 24/7  Discharge Plan Discussed with Primary Caregiver: Yes  Is Caregiver In Agreement with Plan?: Yes  Does Caregiver/Family have Issues with Lodging/Transportation while Pt is in Rehab?: No  Goals/Additional Needs  Patient/Family Goal for Rehab: modified independent and  supervision for PT/OT  Expected length of stay: 10-14 days  Cultural Considerations: pt. denies  Dietary Needs: carb modified  Equipment Needs: TBD  Pt/Family Agrees to Admission and willing to participate: Yes  Decrease burden of Care through IP rehab admission: no  Possible need for SNF placement upon discharge: Not anticipated  Patient Condition: This patient's medical and functional status has changed since the consult dated: 05/11/14 in which the Rehabilitation Physician determined and documented that the patient's condition is appropriate for intensive rehabilitative care in an inpatient rehabilitation facility. See "History of Present Illness" (above) for medical update. Functional changes are: overall min assist. Patient's medical and functional status update has been discussed with the Rehabilitation physician and patient remains appropriate for inpatient rehabilitation. Will admit to inpatient rehab today.  Preadmission Screen Completed By: Cleatrice Burke, 05/15/2014 12:06 PM  ______________________________________________________________________  Discussed status with Dr. Letta Pate on 05/15/2014 at 1215 and received telephone approval for admission today.  Admission Coordinator: Cleatrice Burke, time [id number] Date 05/15/2014.  Cosigned by: Charlett Blake, MD [05/15/2014 12:10 PM]

## 2014-05-15 NOTE — Progress Notes (Signed)
05/15/2014 1230 Nursing note Received call from [facility] if pt. Had not voided in 4 hours after foley removal to perform bladder scan and call back. At 1330, pt. Had not voided nor felt the urge to void. Bladder scan performed showing 238 ml of urine in bladder. PA called back with results and verbal orders received to place foley if unable to void by 1400. Pt. Unable to void by stated time so foley replaced using sterile technique per prior orders.  Flippin, Arville Lime

## 2014-05-16 ENCOUNTER — Inpatient Hospital Stay (HOSPITAL_COMMUNITY): Payer: BC Managed Care – PPO | Admitting: Occupational Therapy

## 2014-05-16 ENCOUNTER — Inpatient Hospital Stay (HOSPITAL_COMMUNITY): Payer: BC Managed Care – PPO | Admitting: Physical Therapy

## 2014-05-16 LAB — COMPREHENSIVE METABOLIC PANEL
ALK PHOS: 47 U/L (ref 39–117)
ALT: 11 U/L (ref 0–53)
AST: 10 U/L (ref 0–37)
Albumin: 2.9 g/dL — ABNORMAL LOW (ref 3.5–5.2)
BUN: 28 mg/dL — ABNORMAL HIGH (ref 6–23)
CALCIUM: 8.9 mg/dL (ref 8.4–10.5)
CHLORIDE: 99 meq/L (ref 96–112)
CO2: 29 meq/L (ref 19–32)
Creatinine, Ser: 1.42 mg/dL — ABNORMAL HIGH (ref 0.50–1.35)
GFR calc Af Amer: 65 mL/min — ABNORMAL LOW (ref >90)
GFR, EST NON AFRICAN AMERICAN: 56 mL/min — AB (ref >90)
Glucose, Bld: 101 mg/dL — ABNORMAL HIGH (ref 70–99)
POTASSIUM: 5.1 meq/L (ref 3.7–5.3)
SODIUM: 139 meq/L (ref 137–147)
Total Bilirubin: 0.4 mg/dL (ref 0.3–1.2)
Total Protein: 6.9 g/dL (ref 6.0–8.3)

## 2014-05-16 LAB — GLUCOSE, CAPILLARY
GLUCOSE-CAPILLARY: 122 mg/dL — AB (ref 70–99)
GLUCOSE-CAPILLARY: 91 mg/dL (ref 70–99)
Glucose-Capillary: 104 mg/dL — ABNORMAL HIGH (ref 70–99)
Glucose-Capillary: 185 mg/dL — ABNORMAL HIGH (ref 70–99)

## 2014-05-16 LAB — CBC WITH DIFFERENTIAL/PLATELET
Basophils Absolute: 0 10*3/uL (ref 0.0–0.1)
Basophils Relative: 0 % (ref 0–1)
EOS PCT: 2 % (ref 0–5)
Eosinophils Absolute: 0.2 10*3/uL (ref 0.0–0.7)
HCT: 28.5 % — ABNORMAL LOW (ref 39.0–52.0)
Hemoglobin: 9.3 g/dL — ABNORMAL LOW (ref 13.0–17.0)
LYMPHS ABS: 2.5 10*3/uL (ref 0.7–4.0)
LYMPHS PCT: 38 % (ref 12–46)
MCH: 29.5 pg (ref 26.0–34.0)
MCHC: 32.6 g/dL (ref 30.0–36.0)
MCV: 90.5 fL (ref 78.0–100.0)
Monocytes Absolute: 0.6 10*3/uL (ref 0.1–1.0)
Monocytes Relative: 9 % (ref 3–12)
Neutro Abs: 3.3 10*3/uL (ref 1.7–7.7)
Neutrophils Relative %: 51 % (ref 43–77)
Platelets: 348 10*3/uL (ref 150–400)
RBC: 3.15 MIL/uL — ABNORMAL LOW (ref 4.22–5.81)
RDW: 12.6 % (ref 11.5–15.5)
WBC: 6.6 10*3/uL (ref 4.0–10.5)

## 2014-05-16 LAB — URINE CULTURE
COLONY COUNT: NO GROWTH
Culture: NO GROWTH

## 2014-05-16 MED ORDER — BIOTENE DRY MOUTH MT LIQD
15.0000 mL | Freq: Four times a day (QID) | OROMUCOSAL | Status: DC
  Administered 2014-05-16 – 2014-05-24 (×21): 15 mL via OROMUCOSAL

## 2014-05-16 MED ORDER — SORBITOL 70 % SOLN
960.0000 mL | TOPICAL_OIL | Freq: Once | ORAL | Status: DC
  Filled 2014-05-16: qty 240

## 2014-05-16 MED ORDER — CHLORHEXIDINE GLUCONATE 0.12 % MT SOLN
15.0000 mL | Freq: Two times a day (BID) | OROMUCOSAL | Status: DC
  Administered 2014-05-16 – 2014-05-25 (×19): 15 mL via OROMUCOSAL
  Filled 2014-05-16 (×21): qty 15

## 2014-05-16 MED ORDER — TAMSULOSIN HCL 0.4 MG PO CAPS
0.4000 mg | ORAL_CAPSULE | Freq: Every day | ORAL | Status: DC
  Administered 2014-05-17: 0.4 mg via ORAL
  Filled 2014-05-16 (×2): qty 1

## 2014-05-16 MED ORDER — MAGNESIUM CITRATE PO SOLN
1.0000 | Freq: Once | ORAL | Status: AC
  Administered 2014-05-16: 1 via ORAL
  Filled 2014-05-16: qty 296

## 2014-05-16 NOTE — Progress Notes (Signed)
Physical Therapy Session Note  Patient Details  Name: [name] [name] MRN: [id number] Date of Birth: Jul 23, 1961  Today's Date: 05/16/2014 Time: 1530-1600 Time Calculation (min): 30 min  Short Term Goals: Week 1:  PT Short Term Goal 1 (Week 1): = LTG of mod I overall secondary to ELOS  Skilled Therapeutic Interventions/Progress Updates:   Pt resting in bed with wife and daughter present.  Discussed goals and focus for therapy with pt and family.  Pt reporting improved energy this pm and decreased pain.  Performed supine > sit hugging pillow on flat bed, no rail with mod A to maintain roll to L and to bring trunk fully upright from side > sit.  Performed stand pivot bed <> w/c mod A without AD.  Performed w/c mobility training for LE strength/endurance x 100' with bilat foot propulsion with supervision and verbal cues for obstacle negotiation.  Performed stair negotiation training up/down 5 stairs x 2 reps with bilat UE support on rails and mod A during step to sequence secondary to buckling of L knee.  Following rest break pt performed gait x 100' with RW and min A; gait noted to have narrow BOS and intermittent L knee buckling.   Back in room pt transferred back to supine in bed with supervision.  Therapy Documentation Precautions:  Precautions Precautions: Sternal;Fall Precaution Comments: sternal precautions Required Braces or Orthoses: Other Brace/Splint Other Brace/Splint: flat darco shoe L foot Restrictions Weight Bearing Restrictions: Yes (sternal prec) Pain: Pain Assessment Pain Assessment: No/denies pain Pain Score: 4  Pain Type: Acute pain Pain Location: Leg Pain Orientation: Right;Left Pain Descriptors / Indicators: Sore Pain Frequency: Constant Pain Onset: On-going Patients Stated Pain Goal: 3 Pain Intervention(s): Medication (See eMAR) Locomotion : Ambulation Ambulation/Gait Assistance: 4: Min assist   See FIM for current functional status  Therapy/Group: Individual  Therapy  Raylene Everts [facility] 05/16/2014, 4:16 PM

## 2014-05-16 NOTE — Evaluation (Signed)
Occupational Therapy Assessment and Plan & Session Notes  Patient Details  Name: Xayvier Vallez MRN: [id number] Date of Birth: 1961/06/15  OT Diagnosis: acute pain and muscle weakness (generalized) Rehab Potential: Rehab Potential: Good ELOS: 10-12 days   Today's Date: 05/16/2014  Problem List:  Patient Active Problem List   Diagnosis Date Noted  . Physical deconditioning 05/15/2014  . S/P CABG x 4 05/13/2014  . Type II or unspecified type diabetes mellitus without mention of complication, uncontrolled 05/08/2014  . CAD (coronary artery disease)     Past Medical History:  Past Medical History  Diagnosis Date  . CAD (coronary artery disease)   . Cardiomyopathy   . Chest pain   . Congestive heart failure   . Diabetes   . Dyspnea   . Hypertension   . Peripheral vascular disease   . Diabetic foot ulcers     left foot 2nd digit ant 5 th digit 05/03/14  . Hypercholesterolemia   . Myocardial infarction   . Chronic kidney disease   . Neuropathy in diabetes    Past Surgical History:  Past Surgical History  Procedure Laterality Date  . Left foot osteomyelitis and wound after surgery    . Tonsilectomy/adenoidectomy with myringotomy    . Cataract extraction    . Toe amputation      Left 3 and 4 toes  . Cardiac catheterization      03/2014  . Coronary artery bypass graft N/A 05/07/2014    Procedure: CORONARY ARTERY BYPASS GRAFTING (CABG);  Surgeon: Gaye Pollack, MD;  Location: [facility];  Service: Open Heart Surgery;  Laterality: N/A;  Times 4 using left internal mammary artery and endoscopically harvested right saphenous vein  . Intraoperative transesophageal echocardiogram N/A 05/07/2014    Procedure: INTRAOPERATIVE TRANSESOPHAGEAL ECHOCARDIOGRAM;  Surgeon: Gaye Pollack, MD;  Location: [facility] OR;  Service: Open Heart Surgery;  Laterality: N/A;    Clinical Impression: Roben Schliep is a 53 y.o. right hand male with history of diabetes mellitus with peripheral neuropathy, PVD,  hypertension who underwent surgery on his left foot in September of 2014 for nonhealing wound due to MRSA osteomyelitis and required amputation of multiple toes. Hospital course at that time complicated by AKI due to antibiotics as well as SOB post op and was found to have EF 15% and defibrillator placed prior to d/c. He had a stress cardiolyte in March 2015 showing a fixed inferior and reversible lateral defect with an EF of 40% and had improvement in EF and ICD removed. He has had intermittent chest discomfort as well as dizziness and falls question due to medication SE. Cardiac cath at [facility] revealed severe multivessel disease and he was admitted on 05/07/14 for CABG by Dr. Cyndia Bent. Post op pulmonary edema treated with diuresis and ABLA being monitored. DARCO shoe ordered for left foot and ailver Hydrofiber for hyperkeratotic periwound with minimal drainage. Foley placed due to urinary retention and voiding trial initiated on 05/14/14 but foley replaced due to inabiltiyt to urinate. CXR with small bilateral pleural effusions and small PTX--IS encouraged. Therapy ongoing and patient noted to be deconditioned due to multiple medical issues as well as limited due to sternal precautions. CIR recommended by rehab team and patient admitted today. Patient transferred to CIR on 05/15/2014 .    Patient currently requires mod assist with basic self-care skills secondary to muscle weakness and muscle joint tightness and decreased standing balance, decreased postural control, decreased balance strategies and difficulty maintaining precautions.  Prior to hospitalization, patient  could complete ADLs and simple IADLs independently. Patient states he would become dizzy during ambulation or standing PTA.   Patient will benefit from skilled intervention to increase independence with basic self-care skills prior to discharge home with care partner.  Anticipate patient will require intermittent supervision and additional OT is  TBD.  OT - End of Session Activity Tolerance: Tolerates 10 - 20 min activity with multiple rests Endurance Deficit: Yes Endurance Deficit Description: patient presents with increased anxiety during mobility tasks OT Assessment Rehab Potential: Good Barriers to Discharge:  (none known at this time) OT Patient demonstrates impairments in the following area(s): Balance;Edema;Endurance;Motor;Pain;Perception;Safety;Sensory;Skin Integrity;Vision OT Basic ADL's Functional Problem(s): Grooming;Bathing;Dressing;Toileting OT Advanced ADL's Functional Problem(s): Simple Meal Preparation OT Transfers Functional Problem(s): Toilet;Tub/Shower OT Additional Impairment(s): None (strengthening > BUEs) OT Plan OT Intensity: Minimum of 1-2 x/day, 45 to 90 minutes OT Frequency: 5 out of 7 days OT Duration/Estimated Length of Stay: 10-12 days OT Treatment/Interventions: Medical illustrator training;Community reintegration;Discharge planning;Disease mangement/prevention;DME/adaptive equipment instruction;Functional mobility training;Psychosocial support;Patient/family education;Pain management;Self Care/advanced ADL retraining;Skin care/wound managment;Splinting/orthotics;UE/LE Strength taining/ROM;Therapeutic Exercise;Therapeutic Activities;Wheelchair propulsion/positioning;UE/LE Coordination activities OT Self Feeding Anticipated Outcome(s): independent OT Basic Self-Care Anticipated Outcome(s): mod I OT Toileting Anticipated Outcome(s): mod I OT Bathroom Transfers Anticipated Outcome(s): mod I OT Recommendation Recommendations for Other Services:  (none at this time) Patient destination: Home Follow Up Recommendations: Other (comment) (OT TBD) Equipment Recommended: 3 in 1 bedside comode;Tub/shower seat;Tub/shower bench Equipment Details: tub transfer bench vs shower seat  Precautions/Restrictions  Precautions Precautions: Sternal;Fall Precaution Comments: sternal precautions Required Braces or  Orthoses: Other Brace/Splint Other Brace/Splint: flat darco shoe Restrictions Weight Bearing Restrictions: No  General Chart Reviewed: Yes Family/Caregiver Present: No  Vital Signs Therapy Vitals Temp: 96.3 F (35.7 C) Temp src: Oral Pulse Rate: 80 Resp: 18 BP: 125/73 mmHg Patient Position (if appropriate): Lying Oxygen Therapy SpO2: 98 % O2 Device: None (Room air)  Home Living/Prior Functioning Home Living Available Help at Discharge: Family (wife is a Pharmacist, hospital and is able to assist patient during summer ) Type of Home: House Home Access: Stairs to enter Technical brewer of Steps: 5 Entrance Stairs-Rails: Right;Left Home Layout: One level  Lives With: Spouse IADL History Homemaking Responsibilities: No Current License: Yes Occupation: On disability Type of Occupation: work around [employer] and Hobbies: fish, golf, watch TV Prior Function Level of Independence: Independent with basic ADLs;Independent with transfers;Independent with gait  Able to Take Stairs?: Reciprically Driving: No Comments: Pt reports he gets out in the yard most days but has recently been limited by dizziness  ADL - See FIM  Vision/Perception  Vision- History Baseline Vision/History: No visual deficits Patient Visual Report: Blurring of vision (patient with complaints of "all white" when going outside with sun) Vision- Assessment Vision Assessment?: Vision impaired- to be further tested in functional context   Cognition Overall Cognitive Status: Within Functional Limits for tasks assessed Orientation Level: Oriented X4 Memory: Appears intact Awareness: Appears intact Problem Solving: Appears intact Safety/Judgment: Appears intact  Sensation Sensation Additional Comments: BUEs appear intact Coordination Gross Motor Movements are Fluid and Coordinated: Yes Fine Motor Movements are Fluid and Coordinated: Yes  Motor  Motor Motor: Other (comment) Motor - Skilled  Clinical Observations: Generalized weakness  Mobility  Bed Mobility Bed Mobility: Sit to Supine Sit to Supine: 5: Supervision Sit to Supine - Details (indicate cue type and reason): Sit> Supine on flat bed, no rail with verbal cues to maintain sternal precautions   Trunk/Postural Assessment  Cervical Assessment Cervical Assessment: Within Functional Limits Thoracic  Assessment Thoracic Assessment: Within Functional Limits Lumbar Assessment Lumbar Assessment: Within Functional Limits Postural Control Postural Control: Deficits on evaluation (L foot toe amputations; impaired balance reactions in standing)   Balance - Patient requires min assist to maintain static & dynamic standing balance with UE support  Extremity/Trunk Assessment RUE Assessment RUE Assessment: Within Functional Limits (can benefit from BUE functional strengthening) LUE Assessment LUE Assessment: Within Functional Limits (can benefit from BUE functional strengthening)  FIM:  FIM - Eating Eating Activity: 0: Activity did not occur FIM - Grooming Grooming Steps: Wash, rinse, dry face;Wash, rinse, dry hands;Oral care, brush teeth, clean dentures;Brush, comb hair;Shave or apply make-up Grooming: 5: Set-up assist to obtain items FIM - Bathing Bathing Steps Patient Completed: Chest;Right Arm;Left Arm;Abdomen;Front perineal area;Buttocks;Right upper leg;Left upper leg Bathing: 4: Min-Patient completes 8-9 [id number] parts or 75+ percent (patient mod assist for sit<>stand) FIM - Upper Body Dressing/Undressing Upper body dressing/undressing: 0: Wears gown/pajamas-no public clothing FIM - Lower Body Dressing/Undressing Lower body dressing/undressing: 0: Wears gown/pajamas-no public clothing FIM - Toileting Toileting: 0: Activity did not occur FIM - Bed/Chair Transfer Bed/Chair Transfer: 3: Supine > Sit: Mod A (lifting assist/Pt. 50-74%/lift 2 legs;3: Bed > Chair or W/C: Mod A (lift or lower assist) (patient with sternal  precautions and requires min verbal cues to adhere to them during bed mobility) FIM - Toilet Transfers Toilet Transfers: 0-Activity did not occur FIM - Tub/Shower Transfers Tub/shower Transfers: 0-Activity did not occur or was simulated   Refer to Care Plan for Long Term Goals  Recommendations for other services: None at this time.   Discharge Criteria: Patient will be discharged from OT if patient refuses treatment 3 consecutive times without medical reason, if treatment goals not met, if there is a change in medical status, if patient makes no progress towards goals or if patient is discharged from hospital.  The above assessment, treatment plan, treatment alternatives and goals were discussed and mutually agreed upon: by patient  ----------------------------------------------------------------------------------------------------------------------------------  SESSION NOTES  Session #1 [phone] [phone] Minutes Individual Therapy No complaints of pain Initial 1:1 occupational therapy evaluation completed. Patient received supine in bed. Therapist discussed patient's PLOF and discharge planning regarding home set-up. Patient unable to verbalize sternal precautions when asked, therapist educated patient on sternal precautions and importance of adhering to them. Patient then engaged in bed mobility, requiring moderate physical assistance and minimal verbal cues to adhere to sternal precautions. Patient sat EOB with no complaints of dizziness. Patient donned Darco boot with set-up assistance and from here, patient stood with moderate assistance (after 3rd attempt and therapist cueing patient for correct body mechanics). Patient ambulated > chair ~3 feet and performed stand>sit with moderate assistance. Patient completed UB/LB bathing in sit<>stand position and grooming tasks seated in w/c. Patient with increased anxiety during sit<>stands. Patient with only minimal complaints of dizziness during  entire session. At end of session, left patient seated in w/c beside bed with all needs within reach. Therapist discussed goals and ELOS with patient; goals set at overall mod I with an ELOS=10-12 days.   Session #2 [id number] Individual Therapy No complaints of pain Patient received seated on elevated toilet seat, RN assisted patient into bathroom. Patient stood without UE support with moderate assistance and transferred into w/c. Patient donned bilateral shoes; darco boot > LLE. Patient washed hands at sink and then propelled self > therapy gym. Patient worked on sit<>stands from w/c. Patient required mod assist > mod assist X2; fluctuating secondary to weakness  and poor body mechanics during sit<>stands. Patient able to independently adhere to sternal precautions during sit<>stands. Patient transferred onto NuStep machine and engaged in exercises for 3 minutes 52 seconds, unable to finish the whole 4 minutes. Patient took seated rest break then transferred back to w/c. Therapist propelled patient back to room secondary to patient with complaints of fatigue > BLEs. Patient left seated in w/c with all needs within reach.   Garron Eline 05/16/2014, 3:12 PM

## 2014-05-16 NOTE — Progress Notes (Signed)
53 y.o. 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 Hospital course at that time complicated by AKI due to antibiotics as well as SOB post op and was found to have EF 15% and defibrillator placed prior to d/c. He had a stress cardiolyte in March 2015 showing a fixed inferior and reversible lateral defect with an EF of 40% and had improvement in EF and ICD removed. He has had intermittent chest discomfort as well as dizziness and falls question due to medication SE. Cardiac cath at [facility] revealed severe multivessel disease and he was admitted on 05/07/14 for CABG by Dr. Cyndia Bent.  Subjective/Complaints:   Objective: Vital Signs: Blood pressure 125/73, pulse 80, temperature 96.3 F (35.7 C), temperature source Oral, resp. rate 18, weight 103.103 kg (227 lb 4.8 oz), SpO2 98.00%. Dg Chest 2 View  05/14/2014   CLINICAL DATA:  Coronary artery disease  EXAM: CHEST  2 VIEW  COMPARISON:  May 12, 2014  FINDINGS: There is persistent patchy left lower lobe consolidation with small left effusion. There is slightly more consolidation in the left base compared to recent prior study. Elsewhere lungs are clear. Heart is mildly enlarged with normal pulmonary vascularity. No pneumothorax. No adenopathy. Patient is status post coronary artery bypass grafting.  IMPRESSION: Persistent left base consolidation with small left effusion. Elsewhere lungs clear. There is slightly more left base opacity compared to recent prior study.   Electronically Signed   By: Lowella Grip M.D.   On: 05/14/2014 07:59   Results for orders placed during the hospital encounter of 05/15/14 (from the past 72 hour(s))  GLUCOSE, CAPILLARY     Status: Abnormal   Collection Time    05/15/14  4:16 PM      Result Value Ref Range   Glucose-Capillary 133  (*) 70 - 99 mg/dL   Comment 1 Notify RN    URINALYSIS, ROUTINE W REFLEX MICROSCOPIC     Status: None   Collection Time    05/15/14  6:48 PM      Result Value Ref Range   Color, Urine YELLOW  YELLOW   APPearance CLEAR  CLEAR   Specific Gravity, Urine 1.010  1.005 - 1.030   pH 7.0  5.0 - 8.0   Glucose, UA NEGATIVE  NEGATIVE mg/dL   Hgb urine dipstick NEGATIVE  NEGATIVE   Bilirubin Urine NEGATIVE  NEGATIVE   Ketones, ur NEGATIVE  NEGATIVE mg/dL   Protein, ur NEGATIVE  NEGATIVE mg/dL   Urobilinogen, UA 0.2  0.0 - 1.0 mg/dL   Nitrite NEGATIVE  NEGATIVE   Leukocytes, UA NEGATIVE  NEGATIVE   Comment: MICROSCOPIC NOT DONE ON URINES WITH NEGATIVE PROTEIN, BLOOD, LEUKOCYTES, NITRITE, OR GLUCOSE <1000 mg/dL.  GLUCOSE, CAPILLARY     Status: Abnormal   Collection Time    05/15/14  9:31 PM      Result Value Ref Range   Glucose-Capillary 120 (*) 70 - 99 mg/dL      Constitutional: He is oriented to person, place, and time. He appears well-developed and well-nourished.  HENT:  Head: Normocephalic and atraumatic.  Eyes: Conjunctivae are normal. Pupils are equal, round, and reactive to light.  Neck: Normal range of motion. Neck supple.  Cardiovascular: Normal rate and regular rhythm.  Murmur heard.  Respiratory: Effort normal. No accessory muscle usage. No respiratory distress. He has  decreased breath sounds in the left lower field. He has no wheezes.  GI: Soft. Bowel sounds are normal. He exhibits no distension. There is no tenderness.  Musculoskeletal:  Left foot with well healed incision on surface and small callus lateral to 5th toes. S/p 3rd and 4th toe amputation. Left 2nd toe with dime sized diabetic ulcer (on plantar surface) with central hole packed with dressing and keratotic lesion on top.  Neurological: He is alert and oriented to person, place, and time.  Speech soft but clear. Follows commands without difficulty. BUE's 4/5 prox to distal. LLE 2/5 HF, KE, foot, RLE 3/5 HF, KE and  foot. Decreased PP and LT in both feet and fingers.  Skin: Skin is warm and dry.  RLE graft incisions with skin glue. Drain site sutures intact without drainage   Assessment/Plan: 1. Functional deficits secondary to deconditioning after CABG which require 3+ hours per day of interdisciplinary therapy in a comprehensive inpatient rehab setting. Physiatrist is providing close team supervision and 24 hour management of active medical problems listed below. Physiatrist and rehab team continue to assess barriers to discharge/monitor patient progress toward functional and medical goals. FIM:                   Comprehension Comprehension Mode: Auditory Comprehension: 7-Follows complex conversation/direction: With no assist  Expression Expression Mode: Verbal Expression: 7-Expresses complex ideas: With no assist  Social Interaction Social Interaction: 7-Interacts appropriately with others - No medications needed.  Problem Solving Problem Solving: 7-Solves complex problems: Recognizes & self-corrects  Memory Memory: 7-Complete Independence: No helper  Medical Problem List and Plan:  1. Functional deficits secondary to Deconditioning after CABG and multiple medical  2. DVT Prophylaxis/Anticoagulation: Pharmaceutical: Lovenox  3. Pain Management: prn oxycodone is effective.  4. Mood: Has a supportive wife who is off for the summer. LCSW to follow for evaluation and support.  5. Neuropsych: This patient is capable of making decisions on his own behalf.  6. Fluid overload: Low salt diet. Check daily weights as well as close monitoring for signs of overload. Off lasix at this time. 7. DM type 2: Glyburide on hold due to renal insufficiency---will change to amaryl and wean off levemir. Monitor BS with ac/hs checks and use SSI for elevated BS. Titrate oral medication as indicated.  8. PVD with multiple left foot ulcers: Continue DARCO shoe for pressure relief.  9. ABLA: H/H with slow  improvement.  Add iron supplement  10. Acute on CKD: Will continue to monitor renal status for now. Push po fluids. Avoid nephrotoxic medications  11. Urinary retention: Continue foley for now and work on alleviating constipation. Continue flomax. UA neg  12. CAD: Continue coreg, lipitor and ASA.  13. Constipation: Will argument bowel regimen. Enema today if no results with am laxatives.    LOS (Days) 1 A FACE TO FACE EVALUATION WAS PERFORMED  Micaila Ziemba E 05/16/2014, 5:55 AM

## 2014-05-16 NOTE — Plan of Care (Signed)
Problem: RH KNOWLEDGE DEFICIT Goal: RH STG INCREASE KNOWLEDGE OF DIABETES Patient & family will be able to identify signs and symptoms of hypo/hyperglycemia, its management & treatment, as well as demonstration of proper blood glucose monitoring & administration techniques. Needs reinforcement Goal: RH STG INCREASE KNOWLEDGE OF HYPERTENSION Patient & family will be able to identify signs and symptoms of hypo/hypertension, and will be able to describe information about medication, dosage, schedule, purpose and adverse effects.  Needs reinforcement

## 2014-05-16 NOTE — Evaluation (Signed)
Physical Therapy Assessment and Plan  Patient Details  Name: [name] [name] MRN: [id number] Date of Birth: [birth year]  PT Diagnosis: Difficulty walking, Dizziness and giddiness, Impaired sensation, Muscle weakness and Pain in sternum Rehab Potential: Good ELOS: 10-12 days   Today's Date: 05/16/2014 Time: 1012-1100 Time Calculation (min): 48 min  Problem List:  Patient Active Problem List   Diagnosis Date Noted  . Physical deconditioning 05/15/2014  . S/P CABG x 4 05/13/2014  . Type II or unspecified type diabetes mellitus without mention of complication, uncontrolled 05/08/2014  . CAD (coronary artery disease)     Past Medical History:  Past Medical History  Diagnosis Date  . CAD (coronary artery disease)   . Cardiomyopathy   . Chest pain   . Congestive heart failure   . Diabetes   . Dyspnea   . Hypertension   . Peripheral vascular disease   . Diabetic foot ulcers     left foot 2nd digit ant 5 th digit 05/03/14  . Hypercholesterolemia   . Myocardial infarction   . Chronic kidney disease   . Neuropathy in diabetes    Past Surgical History:  Past Surgical History  Procedure Laterality Date  . Left foot osteomyelitis and wound after surgery    . Tonsilectomy/adenoidectomy with myringotomy    . Cataract extraction    . Toe amputation      Left 3 and 4 toes  . Cardiac catheterization      03/2014  . Coronary artery bypass graft N/A 05/07/2014    Procedure: CORONARY ARTERY BYPASS GRAFTING (CABG);  Surgeon: Gaye Pollack, MD;  Location: [facility];  Service: Open Heart Surgery;  Laterality: N/A;  Times 4 using left internal mammary artery and endoscopically harvested right saphenous vein  . Intraoperative transesophageal echocardiogram N/A 05/07/2014    Procedure: INTRAOPERATIVE TRANSESOPHAGEAL ECHOCARDIOGRAM;  Surgeon: Gaye Pollack, MD;  Location: [facility] OR;  Service: Open Heart Surgery;  Laterality: N/A;    Assessment & Plan Clinical Impression: Patient is a 53 y.o. 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 Hospital course at that time complicated by AKI due to antibiotics as well as SOB post op and was found to have EF 15% and defibrillator placed prior to d/c. He had a stress cardiolyte in March 2015 showing a fixed inferior and reversible lateral defect with an EF of 40% and had improvement in EF and ICD removed. He has had intermittent chest discomfort as well as dizziness and falls question due to medication SE. Cardiac cath at [facility] revealed severe multivessel disease and he was admitted on 05/07/14 for CABG by Dr. Cyndia Bent. Post op pulmonary edema treated with diuresis and ABLA being monitored. DARCO shoe ordered for left foot and ailver Hydrofiber for hyperkeratotic periwound with minimal drainage. Foley placed due to urinary retention and voiding trial initiated on 05/14/14 but foley replaced due to inabiltiyt to urinate. CXR with small bilateral pleural effusions and small PTX--IS encouraged.  Patient transferred to CIR on 05/15/2014 .   Patient currently requires mod with mobility secondary to muscle weakness, decreased cardiorespiratoy endurance and decreased standing balance, decreased balance strategies and sternal precautions.  Prior to hospitalization, patient was independent  with mobility and lived with Spouse in a House home.  Home access is 5Stairs to enter.  Patient will benefit from skilled PT  intervention to maximize safe functional mobility, minimize fall risk and decrease caregiver burden for planned discharge home with intermittent assist from wife who can provide 24/7 at the moment but will only be able to provide intermittent supervision when she returns to teaching assistant job in August.  Anticipate patient will benefit from follow up [facility] at discharge.  PT -  End of Session Activity Tolerance: Decreased this session Endurance Deficit: Yes Endurance Deficit Description: dizziness in standing, low BP; impaired endurance/activity tolerance from 6 months immobility, CABG  PT Assessment Rehab Potential: Good PT Patient demonstrates impairments in the following area(s): Balance;Endurance;Pain;Sensory;Motor PT Transfers Functional Problem(s): Bed Mobility;Bed to Chair;Car;Furniture PT Locomotion Functional Problem(s): Ambulation;Stairs PT Plan PT Intensity: Minimum of 1-2 x/day ,45 to 90 minutes PT Frequency: 5 out of 7 days PT Duration Estimated Length of Stay: 10-12 days PT Treatment/Interventions: Ambulation/gait training;Balance/vestibular training;Discharge planning;Disease management/prevention;DME/adaptive equipment instruction;Functional mobility training;Pain management;Patient/family education;Psychosocial support;Stair training;Therapeutic Activities;Therapeutic Exercise;UE/LE Strength taining/ROM PT Transfers Anticipated Outcome(s): Mod I PT Locomotion Anticipated Outcome(s): Mod I PT Recommendation Follow Up Recommendations: Home health PT Patient destination: Home Equipment Recommended: Wheelchair (measurements);Wheelchair cushion (measurements) Equipment Details: w/c for community distances  Skilled Therapeutic Intervention Pt presenting with dizziness in standing initially. Vitals assessed with Dinamap and then manually.  KHT donned and standing tolerance re-assessed.  Pt with minimal c/o dizziness with KHT donned.  Pt able to verbalize all sternal precautions.  Pt educated on role of PT and PT goals and pt personal goals discussed.  Pt would like to be able to do stairs and ambulate without a RW at D/C.  Performed transfer training mat > w/c > bed stand pivot without use of RW with mod lifting assistance to stand from mat secondary to sternal precautions and for balance when pivoting.  Pt performed sit > supine with supervision with  verbal cues to maintain sternal precautions.  Pt positioned in bed to rest until next session in pm.    PT Evaluation Precautions/Restrictions Precautions Precautions: Sternal;Fall Precaution Comments: sternal precautions Other Brace/Splint: flat darco shoe L foot Restrictions Weight Bearing Restrictions: Yes (sternal prec) General Chart Reviewed: Yes Amount of Missed PT Time (min): 13 Minutes Response to Previous Treatment: Patient reporting fatigue but able to participate. Family/Caregiver Present: No Vital SignsTherapy Vitals Patient Position (if appropriate): Orthostatic Vitals Oxygen Therapy SpO2: 98 % O2 Device: None (Room air) Pain Pain Assessment Pain Assessment: No/denies pain Pain Score: 0-No pain Home Living/Prior Functioning Home Living Available Help at Discharge: Family Type of Home: House Home Access: Stairs to enter [employer] of Steps: 5 Entrance Stairs-Rails: Right;Left Home Layout: One level Additional Comments: Not currently; Pt. had HHRN for wound vac 11/14 to 2/15 for foot ulcer per pt.  Lives With: Spouse Prior Function Level of Independence: Independent with gait;Independent with transfers  Able to Take Stairs?: Yes Driving: Yes Vocation: Unemployed (secondary to medical condition) Comments: Pt reports he gets out in the yard most days but has recently been limited by dizziness  Cognition Orientation Level: Oriented X4 Sensation Sensation Light Touch: Impaired Detail Light Touch Impaired Details: Impaired RLE;Impaired LLE;Absent RLE;Absent LLE Stereognosis: Not tested Hot/Cold: Not tested Proprioception: Not tested Additional Comments: bilat LE impaired to light touch but absent bilat feet; L > R impaired sensation Coordination Gross Motor Movements are Fluid and Coordinated: Yes Motor  Motor Motor: Other (comment) Motor - Skilled Clinical Observations: Generalized weakness  Mobility Bed Mobility Bed Mobility: Sit to  Supine Sit to Supine: 5: Supervision Sit to Supine - Details (indicate  cue type and reason): Sit> Supine on flat bed, no rail with verbal cues to maintain sternal precautions Transfers Transfers: Yes Stand Pivot Transfers: 3: Mod assist Stand Pivot Transfer Details (indicate cue type and reason): Mod A for sit <> Stand from w/c or elevated mat + mod A for stand pivot transfers mat <> w/c <> bed with lifting assistance secondary to pt unable to use bilat UE due to sternal precautions and LE weakness and assistance + assistance during pivot with and without UE support on RW for balance and safety Locomotion  Ambulation Ambulation/Gait Assistance: 4: Min assist Ambulation Distance (Feet): 25 Feet Ambulation/Gait Assistance Details: With RW pt ambulated 25' with min A for safety/balance.  Pt reports his goal is to D/C ambulatory without a RW.  Will assess gait without RW during second session. Wheelchair Mobility Wheelchair Mobility: No (due to sternal precautions) Distance: 150  Will assess stairs in pm Trunk/Postural Assessment  Cervical Assessment Cervical Assessment: Within Functional Limits Thoracic Assessment Thoracic Assessment: Within Functional Limits Lumbar Assessment Lumbar Assessment: Within Functional Limits Postural Control Postural Control: Deficits on evaluation (L foot toe amputations; impaired balance reactions in standing)  Balance  To be assessed further in pm; not assessed in am due to fatigue Extremity Assessment  RLE Assessment RLE Assessment: Exceptions to WFL (4/5 overall) LLE Assessment LLE Assessment: Exceptions to WFL (4/5 except hip flexion 3/5)  FIM:  FIM - Bed/Chair Transfer Bed/Chair Transfer: 5: Sit > Supine: Supervision (verbal cues/safety issues);3: Bed > Chair or W/C: Mod A (lift or lower assist);3: Chair or W/C > Bed: Mod A (lift or lower assist) FIM - Locomotion: Wheelchair Distance: 150 Locomotion: Wheelchair: 1: Total Assistance/staff pushes  wheelchair (Pt<25%) FIM - Locomotion: Ambulation Locomotion: Ambulation Assistive Devices: Walker - Rolling Ambulation/Gait Assistance: 4: Min assist Locomotion: Ambulation: 1: Travels less than 50 ft with minimal assistance (Pt.>75%)   Refer to Care Plan for Long Term Goals  Recommendations for other services: None  Discharge Criteria: Patient will be discharged from PT if patient refuses treatment 3 consecutive times without medical reason, if treatment goals not met, if there is a change in medical status, if patient makes no progress towards goals or if patient is discharged from hospital.  The above assessment, treatment plan, treatment alternatives and goals were discussed and mutually agreed upon: by patient  Hall, Audra Faucette 05/16/2014, 12:19 PM  

## 2014-05-17 ENCOUNTER — Encounter (HOSPITAL_COMMUNITY): Payer: BC Managed Care – PPO | Admitting: Occupational Therapy

## 2014-05-17 ENCOUNTER — Inpatient Hospital Stay (HOSPITAL_COMMUNITY): Payer: BC Managed Care – PPO

## 2014-05-17 ENCOUNTER — Inpatient Hospital Stay (HOSPITAL_COMMUNITY): Payer: BC Managed Care – PPO | Admitting: Occupational Therapy

## 2014-05-17 DIAGNOSIS — R5381 Other malaise: Secondary | ICD-10-CM

## 2014-05-17 LAB — GLUCOSE, CAPILLARY
GLUCOSE-CAPILLARY: 182 mg/dL — AB (ref 70–99)
GLUCOSE-CAPILLARY: 107 mg/dL — AB (ref 70–99)
Glucose-Capillary: 109 mg/dL — ABNORMAL HIGH (ref 70–99)
Glucose-Capillary: 126 mg/dL — ABNORMAL HIGH (ref 70–99)

## 2014-05-17 MED ORDER — SORBITOL 70 % SOLN
30.0000 mL | Freq: Four times a day (QID) | Status: DC | PRN
  Administered 2014-05-17: 30 mL via ORAL
  Filled 2014-05-17: qty 30

## 2014-05-17 NOTE — Care Management Note (Signed)
Inpatient [facility] Individual Statement of Services  Patient Name:  [name] [name]  Date:  05/17/2014  Welcome to the [facility].  Our goal is to provide you with an individualized program based on your diagnosis and situation, designed to meet your specific needs.  With this comprehensive rehabilitation program, you will be expected to participate in at least 3 hours of rehabilitation therapies Monday-Friday, with modified therapy programming on the weekends.  Your rehabilitation program will include the following services:  Physical Therapy (PT), Occupational Therapy (OT), 24 hour per day rehabilitation nursing, Therapeutic Recreaction (TR), Case Management (Social Worker), Rehabilitation Medicine, Nutrition Services and Pharmacy Services  Weekly team conferences will be held on Tuesdays to discuss your progress.  Your Social Worker will talk with you frequently to get your input and to update you on team discussions.  Team conferences with you and your family in attendance may also be held.  Expected length of stay: 10 days  Overall anticipated outcome: modified independent  Depending on your progress and recovery, your program may change. Your Social Worker will coordinate services and will keep you informed of any changes. Your Social Worker's name and contact numbers are listed  below.  The following services may also be recommended but are not provided by the [facility] will be made to provide these services after discharge if needed.  Arrangements include referral to agencies that provide these services.  Your insurance has been verified to be:  BC[employer] ov [place] Your primary doctor is:  Dr. Ginette Pitman  Pertinent information will be shared with your doctor and your insurance company.  Social Worker:  [place], [place]  or ([phone]   Information discussed with and copy given to patient by: Lennart Pall, 05/17/2014, 3:41 PM

## 2014-05-17 NOTE — Progress Notes (Signed)
Changed patients dressing on left foot per MD order.  Wife at bedside upon assessment, reports "wound looks worse than it did 2 days ago."  There is a blackened area shadowing the existing foot ulcer in a ring shape.  Overall the entire foot has peeling skin.  Foul smelling odor noted upon removal of existing dressing.  Purulent drainage noted at a spot on lateral foot under a peeling part of skin, which is new according to wife.  Wife seems very concerned that he could be developing an infection that could "go to his heart" and wants an MD to examine for need in treatment changes.  Patient tolerated treatment well, no complaints of pain due to neuropathy.  Will continue to monitor.  Brita Romp, RN

## 2014-05-17 NOTE — Progress Notes (Addendum)
Physical Therapy Session Note  Patient Details  Name: Beric Panagakos MRN: [id number] Date of Birth: 1961-03-23  Today's Date: 05/17/2014 Time: [id number] Time Calculation (min): 55 min  Short Term Goals: Week 1:  PT Short Term Goal 1 (Week 1): = LTG of mod I overall secondary to ELOS  Skilled Therapeutic Interventions/Progress Updates:  Pt. Received in gym, supine on mat table. Pt. agreeable to physical therapy. Pt. Indicates BLE discomfort, later rated at 2/10 due to soreness (not pain).  NMRe-ed:  Performed Berg Balance Test on pt with RW on standby for safety. Pt. Required frequent rest/recovery breaks due to feeling lightheaded. Pt. supervision to max A for testing due to frequent LOBs. Patient demonstrates increased fall risk as noted by score of  16/56 on Berg Balance Scale.  (<36= high risk for falls, close to 100%; 37-45 significant >80%; 46-51 moderate >50%; 52-55 lower >25%)   Therapeutic Activities:  Transfers: Supine<>sit x 4; Mod I to I for activity and adheres to sternal precautions 80% of time. Sit<>stand x 20 with verbal cues for hand use; min A with RW for support. AAROM BUE and stretching for "tightness" described by pt and OT. Pt. Had decreased ROM due to limited use and PLOF. Pt. Has L>R discomfort with AAROM. Stairs: 10 steps with 2 rails mod A with step-to pattern for safety. Pt. Required verbal and tactile cues for safety.  Gait: 12' with RW min A for safety and stability. Pt. Requires verbal cues for step width; displays scissoring pattern with fatigue. Pt. Attempted one standing rest break, but unable to maintain BLE support; pt. Attempts to compensate with ataxic-like weight shifting between LEs. Pt. Unaware of energy conservation/limits; coached on being more aware.  See Pain below  Pt. Supine in bed with breakfast, and all needs within reach. Pt. Verbalized sternal precautions and indicated family would be present this afternoon. Pt. Reports no c/o pain, and is  looking forward to resting.  Therapy Documentation Precautions:  Precautions Precautions: Sternal;Fall Precaution Comments: sternal precautions Required Braces or Orthoses: Other Brace/Splint Other Brace/Splint: flat darco shoe L foot Restrictions Weight Bearing Restrictions: Yes (sternal prec) General:   Vital Signs: HR: 89-98 SpO2: 96-98 Pain: Pt states 2/10 pain associated with BLE soreness due to exercise/activity from prior day. Pain/discomfort subsided with rest. Locomotion : Ambulation Ambulation/Gait Assistance: 4: Min assist   Balance: Balance Balance Assessed: Yes Standardized Balance Assessment Standardized Balance Assessment: Berg Balance Test Berg Balance Test Sit to Stand: Able to stand using hands after several tries Standing Unsupported: Able to stand 30 seconds unsupported Sitting with Back Unsupported but Feet Supported on Floor or Stool: Able to sit safely and securely 2 minutes Stand to Sit: Controls descent by using hands Transfers: Able to transfer safely, definite need of hands Standing Unsupported with Eyes Closed: Needs help to keep from falling Standing Ubsupported with Feet Together: Able to place feet together independently but unable to hold for 30 seconds From Standing, Reach Forward with Outstretched Arm: Loses balance while trying/requires external support From Standing Position, Pick up Object from Floor: Unable to try/needs assist to keep balance From Standing Position, Turn to Look Behind Over each Shoulder: Needs assist to keep from losing balance and falling Turn 360 Degrees: Needs assistance while turning Standing Unsupported, Alternately Place Feet on Step/Stool: Needs assistance to keep from falling or unable to try Standing Unsupported, One Foot in Front: Loses balance while stepping or standing Standing on One Leg: Unable to try or needs assist to prevent fall  Total Score: 16  See FIM for current functional status  Therapy/Group:  Individual Therapy  Juluis Mire 05/17/2014, 9:42 AM

## 2014-05-17 NOTE — Progress Notes (Signed)
53 y.o. 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 Hospital course at that time complicated by AKI due to antibiotics as well as SOB post op and was found to have EF 15% and defibrillator placed prior to d/c. He had a stress cardiolyte in March 2015 showing a fixed inferior and reversible lateral defect with an EF of 40% and had improvement in EF and ICD removed. He has had intermittent chest discomfort as well as dizziness and falls question due to medication SE. Cardiac cath at [facility] revealed severe multivessel disease and he was admitted on 05/07/14 for CABG by Dr. Cyndia Bent.  Subjective/Complaints: Voiding trial earlier in week was not successful Good response to Mg Citrate  Objective: Vital Signs: Blood pressure 118/65, pulse 88, temperature 98.8 F (37.1 C), temperature source Oral, resp. rate 18, weight 103.284 kg (227 lb 11.2 oz), SpO2 97.00%. No results found. Results for orders placed during the hospital encounter of 05/15/14 (from the past 72 hour(s))  GLUCOSE, CAPILLARY     Status: Abnormal   Collection Time    05/15/14  4:16 PM      Result Value Ref Range   Glucose-Capillary 133 (*) 70 - 99 mg/dL   Comment 1 Notify RN    URINALYSIS, ROUTINE W REFLEX MICROSCOPIC     Status: None   Collection Time    05/15/14  6:48 PM      Result Value Ref Range   Color, Urine YELLOW  YELLOW   APPearance CLEAR  CLEAR   Specific Gravity, Urine 1.010  1.005 - 1.030   pH 7.0  5.0 - 8.0   Glucose, UA NEGATIVE  NEGATIVE mg/dL   Hgb urine dipstick NEGATIVE  NEGATIVE   Bilirubin Urine NEGATIVE  NEGATIVE   Ketones, ur NEGATIVE  NEGATIVE mg/dL   Protein, ur NEGATIVE  NEGATIVE mg/dL   Urobilinogen, UA 0.2  0.0 - 1.0 mg/dL   Nitrite NEGATIVE  NEGATIVE   Leukocytes, UA NEGATIVE  NEGATIVE   Comment:  MICROSCOPIC NOT DONE ON URINES WITH NEGATIVE PROTEIN, BLOOD, LEUKOCYTES, NITRITE, OR GLUCOSE <1000 mg/dL.  URINE CULTURE     Status: None   Collection Time    05/15/14  6:48 PM      Result Value Ref Range   Specimen Description URINE, CATHETERIZED     Special Requests NONE     Culture  Setup Time       Value: 05/15/2014 19:28     Performed at [facility]       Value: NO GROWTH     Performed at [facility]   Culture       Value: NO GROWTH     Performed at [facility]   Report Status 05/16/2014 FINAL    GLUCOSE, CAPILLARY     Status: Abnormal   Collection Time    05/15/14  9:31 PM      Result Value Ref Range   Glucose-Capillary 120 (*) 70 - 99 mg/dL  GLUCOSE, CAPILLARY     Status: Abnormal   Collection Time    05/16/14  7:07 AM      Result Value Ref Range   Glucose-Capillary 104 (*) 70 - 99 mg/dL   Comment 1 Notify RN    CBC WITH DIFFERENTIAL     Status: Abnormal  Collection Time    05/16/14  7:15 AM      Result Value Ref Range   WBC 6.6  4.0 - 10.5 K/uL   RBC 3.15 (*) 4.22 - 5.81 MIL/uL   Hemoglobin 9.3 (*) 13.0 - 17.0 g/dL   HCT 28.5 (*) 39.0 - 52.0 %   MCV 90.5  78.0 - 100.0 fL   MCH 29.5  26.0 - 34.0 pg   MCHC 32.6  30.0 - 36.0 g/dL   RDW 12.6  11.5 - 15.5 %   Platelets 348  150 - 400 K/uL   Neutrophils Relative % 51  43 - 77 %   Neutro Abs 3.3  1.7 - 7.7 K/uL   Lymphocytes Relative 38  12 - 46 %   Lymphs Abs 2.5  0.7 - 4.0 K/uL   Monocytes Relative 9  3 - 12 %   Monocytes Absolute 0.6  0.1 - 1.0 K/uL   Eosinophils Relative 2  0 - 5 %   Eosinophils Absolute 0.2  0.0 - 0.7 K/uL   Basophils Relative 0  0 - 1 %   Basophils Absolute 0.0  0.0 - 0.1 K/uL  COMPREHENSIVE METABOLIC PANEL     Status: Abnormal   Collection Time    05/16/14  7:15 AM      Result Value Ref Range   Sodium 139  137 - 147 mEq/L   Potassium 5.1  3.7 - 5.3 mEq/L   Chloride 99  96 - 112 mEq/L   CO2 29  19 - 32 mEq/L   Glucose, Bld 101 (*) 70 - 99  mg/dL   BUN 28 (*) 6 - 23 mg/dL   Creatinine, Ser 1.42 (*) 0.50 - 1.35 mg/dL   Calcium 8.9  8.4 - 10.5 mg/dL   Total Protein 6.9  6.0 - 8.3 g/dL   Albumin 2.9 (*) 3.5 - 5.2 g/dL   AST 10  0 - 37 U/L   ALT 11  0 - 53 U/L   Alkaline Phosphatase 47  39 - 117 U/L   Total Bilirubin 0.4  0.3 - 1.2 mg/dL   GFR calc non Af Amer 56 (*) >90 mL/min   GFR calc Af Amer 65 (*) >90 mL/min   Comment: (NOTE)     The eGFR has been calculated using the CKD EPI equation.     This calculation has not been validated in all clinical situations.     eGFR's persistently <90 mL/min signify possible Chronic Kidney     Disease.  GLUCOSE, CAPILLARY     Status: Abnormal   Collection Time    05/16/14 11:14 AM      Result Value Ref Range   Glucose-Capillary 185 (*) 70 - 99 mg/dL   Comment 1 Notify RN    GLUCOSE, CAPILLARY     Status: Abnormal   Collection Time    05/16/14  4:08 PM      Result Value Ref Range   Glucose-Capillary 122 (*) 70 - 99 mg/dL   Comment 1 Notify RN    GLUCOSE, CAPILLARY     Status: None   Collection Time    05/16/14 10:25 PM      Result Value Ref Range   Glucose-Capillary 91  70 - 99 mg/dL      Constitutional: He is oriented to person, place, and time. He appears well-developed and well-nourished.  HENT:  Head: Normocephalic and atraumatic.  Eyes: Conjunctivae are normal. Pupils are equal, round, and reactive to light.  Neck: Normal range of motion. Neck supple.  Cardiovascular: Normal rate and regular rhythm.  Murmur heard.  Respiratory: Effort normal. No accessory muscle usage. No respiratory distress. He has decreased breath sounds in the left lower field. He has no wheezes.  GI: Soft. Bowel sounds are normal. He exhibits no distension. There is no tenderness.  Musculoskeletal:  Left foot with well healed incision on surface and small callus lateral to 5th toes. S/p 3rd and 4th toe amputation.  Neurological: He is alert and oriented to person, place, and time.  Speech soft  but clear. Follows commands without difficulty. BUE's 4/5 prox to distal. LLE 2/5 HF, KE, foot, RLE 3/5 HF, KE and foot. Decreased PP and LT in both feet and fingers.  Skin: Skin is warm and dry.  RLE graft incisions with skin glue.    Assessment/Plan: 1. Functional deficits secondary to deconditioning after CABG which require 3+ hours per day of interdisciplinary therapy in a comprehensive inpatient rehab setting. Physiatrist is providing close team supervision and 24 hour management of active medical problems listed below. Physiatrist and rehab team continue to assess barriers to discharge/monitor patient progress toward functional and medical goals. FIM: FIM - Bathing Bathing Steps Patient Completed: Chest;Right Arm;Left Arm;Abdomen;Front perineal area;Buttocks;Right upper leg;Left upper leg Bathing: 4: Min-Patient completes 8-9 42f10 parts or 75+ percent (patient mod assist for sit<>stand)  FIM - Upper Body Dressing/Undressing Upper body dressing/undressing: 0: Wears gown/pajamas-no public clothing FIM - Lower Body Dressing/Undressing Lower body dressing/undressing: 0: Wears gown/pajamas-no public clothing  FIM - Toileting Toileting steps completed by patient: Performs perineal hygiene Toileting Assistive Devices: Grab bar or rail for support Toileting: 2: Max-Patient completed 1 of 3 steps  FIM - TAir cabin crewTransfers: 0-Activity did not occur  FIM - BIT sales professionalTransfer: 5: Sit > Supine: Supervision (verbal cues/safety issues);3: Bed > Chair or W/C: Mod A (lift or lower assist);3: Chair or W/C > Bed: Mod A (lift or lower assist)  FIM - Locomotion: Wheelchair Distance: 150 Locomotion: Wheelchair: 1: Total Assistance/staff pushes wheelchair (Pt<25%) FIM - Locomotion: Ambulation Locomotion: Ambulation Assistive Devices: WAdministratorAmbulation/Gait Assistance: 4: Min assist Locomotion: Ambulation: 2: Travels 50 - 149 ft with minimal assistance  (Pt.>75%)  Comprehension Comprehension Mode: Auditory Comprehension: 6-Follows complex conversation/direction: With extra time/assistive device  Expression Expression Mode: Verbal Expression: 6-Expresses complex ideas: With extra time/assistive device  Social Interaction Social Interaction: 6-Interacts appropriately with others with medication or extra time (anti-anxiety, antidepressant).  Problem Solving Problem Solving: 6-Solves complex problems: With extra time  Memory Memory: 6-More than reasonable amt of time  Medical Problem List and Plan:  1. Functional deficits secondary to Deconditioning after CABG and multiple medical  2. DVT Prophylaxis/Anticoagulation: Pharmaceutical: Lovenox  3. Pain Management: prn oxycodone is effective.  4. Mood: Has a supportive wife who is off for the summer. LCSW to follow for evaluation and support.  5. Neuropsych: This patient is capable of making decisions on his own behalf.  6. Fluid overload: Low salt diet. Check daily weights as well as close monitoring for signs of overload. Off lasix at this time. 7. DM type 2: Glyburide on hold due to renal insufficiency---will change to amaryl and wean off levemir. Monitor BS with ac/hs checks and use SSI for elevated BS. Titrate oral medication as indicated.  8. PVD with multiple left foot ulcers: Continue DARCO shoe for pressure relief.  9. ABLA: H/H with slow improvement.  Add iron supplement  10. Acute on CKD: Will  continue to monitor renal status for now. Push po fluids. Avoid nephrotoxic medications  11. Urinary retention: Continue foley voiding trial at end of week. Continue flomax. UA neg  12. CAD: Continue coreg, lipitor and ASA.  13. Constipation: improved , monitor    LOS (Days) 2 A FACE TO FACE EVALUATION WAS PERFORMED  KIRSTEINS,ANDREW E 05/17/2014, 6:39 AM

## 2014-05-17 NOTE — Progress Notes (Signed)
Occupational Therapy Session Notes  Patient Details  Name: [name] [name] MRN: [id number] Date of Birth: 06-02-1961  Today's Date: 05/17/2014  Short Term Goals: Week 1:  OT Short Term Goal 1 (Week 1): Patient will perform sit<>stand with supervision in order to increase independence with LB ADLs OT Short Term Goal 2 (Week 1): Patient will perform toilet transfers with supervision OT Short Term Goal 3 (Week 1): Patient will perform UB/LB dressing with supervision OT Short Term Goal 4 (Week 1): Patient will adere to sternal precautions at least 75% of the time independently  Skilled Therapeutic Interventions/Progress Updates:   Session #1 [id number] - 55 Minutes Individual Therapy No complaints of pain Patient received supine in bed. Patient eager to work with therapy this am, stating he felt better today and felt he had a good workout yesterday. Patient transferred EOB>w/c, therapist taught patient squat pivot technique to increase independence; patient able to perform this transfer with minimal assistance. From here, patient worked on propelling self into bathroom. Patient performed stand pivot transfer w/c > toilet seat with minimal assistance. Patient very pleased with progress so far. Patient with no successful BM. Patient transferred back to w/c with minimal assistance and therapist assisted patient > sink for UB/LB bathing & dressing tasks in sit<>stand position. After dressing, patient propelled self > therapy gym using BLEs, no arms secondary to sternal precautions. Patient transferred onto NuStep machine with minimal assistance and without using arms, worked on BLE strengthening and overall endurance. Patient able to engage in NuStep exercise for 6 minutes this am on level 4. Patient transferred from [place] and therapist educated patient on BUE body weight exercises in seated and supine positions. Patient with increased stiffness throughout entire body. Patient able to adhere to sternal  precautions with supervision/min verbal cues. Therapist made sure not to let patient break sternal precautions during exercises. At end of session, left patient supine on mat with PT present for next therapy session.   Session #2 [id number] - 40 Minutes Individual Therapy No complaints of pain Patient received seated in recliner. Patient stood with RW and ambulated from room > ADL apartment with minimal assistance, Patient sat EOB for a small break. Therapist set-up simulated shower stall and educated patient on safe & effective shower stall transfer using RW(ambulting in shower backwards). Patient performed transfer with minimal assistance. From here, patient ambulated > therapy gym for therapeutic activity focusing on sit<>stands, dynamic standing balance/tolerance/endurance(with & without UE support), and overall activity tolerance/endurance. Patient ambulated back to room and therapist left patient seated in recliner with all needs within reach. Patient with complaints of fatigue at end of session.   Precautions:  Precautions Precautions: Sternal;Fall Precaution Comments: sternal precautions Required Braces or Orthoses: Other Brace/Splint Other Brace/Splint: flat darco shoe L foot Restrictions Weight Bearing Restrictions: Yes (sternal prec)  See FIM for current functional status  [name] [name] 05/17/2014, 7:20 AM

## 2014-05-17 NOTE — Progress Notes (Signed)
Physical Therapy Session Note  Patient Details  Name: Atilla Barraclough MRN: [id number] Date of Birth: 1961/11/12  Today's Date: 05/17/2014 Time: [id number] Time Calculation (min): 31 min  Short Term Goals: Week 1:  PT Short Term Goal 1 (Week 1): = LTG of mod I overall secondary to ELOS  Skilled Therapeutic Interventions/Progress Updates:   Pt. Supine in bed with HOB elevated. Pt. Agreeable to therapy.  Therapeutic Activities: Transfers: supine<>sit (S) x 8 for increased endurance and trunk strengthening; sit<>stand x 5 with RW min A for stability for increased sequencing and muscle reeducation; bed<>recliner x1 RW min A for stability. Pt. Requires therapeutic rest/recovery breaks due to deconditioned state.  Pt. C/o soreness in BLE, shoulders, and back due to increased activity levels. Pt. Does not indicated pain, but states pain meds diminish symptoms.  Pain: pt. Indicates he will call for medication if he needs it; currently comfortable and resting seated in recliner in preparation for next therapist. All needs are within reach.  Therapy Documentation Precautions:  Precautions Precautions: Sternal;Fall Precaution Comments: sternal precautions Required Braces or Orthoses: Other Brace/Splint Other Brace/Splint: flat darco shoe L foot Restrictions Weight Bearing Restrictions: Yes (sternal prec)  See FIM for current functional status  Therapy/Group: Individual Therapy  Juluis Mire 05/17/2014, 3:54 PM

## 2014-05-17 NOTE — Progress Notes (Signed)
Patient noted to have what appears to be blood in foley drainage bag with few small clots and sediment.  Assessed catheter, found catheter not to be properly secured.  Patient states he felt it "tug" when he was lying in bed earlier but it is not causing him any discomfort at this time.  Re-secured device to inner thigh.  Algis Liming, PA noticed this finding also passing by room and recommended patient increase fluid intake and to monitor.  Encouraged fluids per request.  Will continue to monitor.  Brita Romp, RN

## 2014-05-18 ENCOUNTER — Inpatient Hospital Stay (HOSPITAL_COMMUNITY): Payer: BC Managed Care – PPO

## 2014-05-18 ENCOUNTER — Inpatient Hospital Stay (HOSPITAL_COMMUNITY): Payer: BC Managed Care – PPO | Admitting: Occupational Therapy

## 2014-05-18 ENCOUNTER — Encounter (HOSPITAL_COMMUNITY): Payer: BC Managed Care – PPO | Admitting: Occupational Therapy

## 2014-05-18 LAB — CBC WITH DIFFERENTIAL/PLATELET
BASOS ABS: 0 10*3/uL (ref 0.0–0.1)
BASOS PCT: 0 % (ref 0–1)
EOS PCT: 0 % (ref 0–5)
Eosinophils Absolute: 0.1 10*3/uL (ref 0.0–0.7)
HEMATOCRIT: 28.9 % — AB (ref 39.0–52.0)
HEMOGLOBIN: 9.4 g/dL — AB (ref 13.0–17.0)
Lymphocytes Relative: 8 % — ABNORMAL LOW (ref 12–46)
Lymphs Abs: 1.3 10*3/uL (ref 0.7–4.0)
MCH: 29.4 pg (ref 26.0–34.0)
MCHC: 32.5 g/dL (ref 30.0–36.0)
MCV: 90.3 fL (ref 78.0–100.0)
MONOS PCT: 8 % (ref 3–12)
Monocytes Absolute: 1.3 10*3/uL — ABNORMAL HIGH (ref 0.1–1.0)
Neutro Abs: 14 10*3/uL — ABNORMAL HIGH (ref 1.7–7.7)
Neutrophils Relative %: 84 % — ABNORMAL HIGH (ref 43–77)
Platelets: 344 10*3/uL (ref 150–400)
RBC: 3.2 MIL/uL — ABNORMAL LOW (ref 4.22–5.81)
RDW: 12.9 % (ref 11.5–15.5)
WBC: 16.8 10*3/uL — ABNORMAL HIGH (ref 4.0–10.5)

## 2014-05-18 LAB — URINE MICROSCOPIC-ADD ON

## 2014-05-18 LAB — URINALYSIS, ROUTINE W REFLEX MICROSCOPIC
BILIRUBIN URINE: NEGATIVE
Glucose, UA: NEGATIVE mg/dL
Ketones, ur: NEGATIVE mg/dL
Nitrite: POSITIVE — AB
Protein, ur: 100 mg/dL — AB
SPECIFIC GRAVITY, URINE: 1.02 (ref 1.005–1.030)
UROBILINOGEN UA: 1 mg/dL (ref 0.0–1.0)
pH: 7.5 (ref 5.0–8.0)

## 2014-05-18 LAB — GLUCOSE, CAPILLARY
Glucose-Capillary: 97 mg/dL (ref 70–99)
Glucose-Capillary: 150 mg/dL — ABNORMAL HIGH (ref 70–99)
Glucose-Capillary: 84 mg/dL (ref 70–99)
Glucose-Capillary: 76 mg/dL (ref 70–99)

## 2014-05-18 MED ORDER — VANCOMYCIN HCL 10 G IV SOLR
1500.0000 mg | Freq: Two times a day (BID) | INTRAVENOUS | Status: DC
  Filled 2014-05-18: qty 1500

## 2014-05-18 MED ORDER — CEPHALEXIN 500 MG PO CAPS
500.0000 mg | ORAL_CAPSULE | Freq: Three times a day (TID) | ORAL | Status: DC
  Administered 2014-05-18 – 2014-05-21 (×9): 500 mg via ORAL
  Filled 2014-05-18 (×13): qty 1

## 2014-05-18 MED ORDER — CEPHALEXIN 250 MG PO CAPS: 250.0000 mg | ORAL_CAPSULE | Freq: Three times a day (TID) | ORAL | Status: DC

## 2014-05-18 NOTE — Progress Notes (Signed)
Social Work  Social Work Assessment and Plan  Patient Details  Name: [name] [name] MRN: [id number] Date of Birth: [birth year]  Today's Date: 05/18/2014  Problem List:  Patient Active Problem List   Diagnosis Date Noted  . Physical deconditioning 05/15/2014  . S/P CABG x 4 05/13/2014  . Type II or unspecified type diabetes mellitus without mention of complication, uncontrolled 05/08/2014  . CAD (coronary artery disease)    Past Medical History:  Past Medical History  Diagnosis Date  . CAD (coronary artery disease)   . Cardiomyopathy   . Chest pain   . Congestive heart failure   . Diabetes   . Dyspnea   . Hypertension   . Peripheral vascular disease   . Diabetic foot ulcers     left foot 2nd digit ant 5 th digit 05/03/14  . Hypercholesterolemia   . Myocardial infarction   . Chronic kidney disease   . Neuropathy in diabetes    Past Surgical History:  Past Surgical History  Procedure Laterality Date  . Left foot osteomyelitis and wound after surgery    . Tonsilectomy/adenoidectomy with myringotomy    . Cataract extraction    . Toe amputation      Left 3 and 4 toes  . Cardiac catheterization      03/2014  . Coronary artery bypass graft N/A 05/07/2014    Procedure: CORONARY ARTERY BYPASS GRAFTING (CABG);  Surgeon: Gaye Pollack, MD;  Location: [facility];  Service: Open Heart Surgery;  Laterality: N/A;  Times 4 using left internal mammary artery and endoscopically harvested right saphenous vein  . Intraoperative transesophageal echocardiogram N/A 05/07/2014    Procedure: INTRAOPERATIVE TRANSESOPHAGEAL ECHOCARDIOGRAM;  Surgeon: Gaye Pollack, MD;  Location: [facility] OR;  Service: Open Heart Surgery;  Laterality: N/A;   Social History:  reports that he has never smoked. He has never used smokeless tobacco. He reports that he does not drink alcohol or use illicit drugs.  Family / Support Systems Marital Status: Married How Long?: Jan 2015 Patient Roles: Spouse;Parent Spouse/Significant  Other: wife, Diezel Schorr @ [phone]  Children: Pt has one 20 yo daughter who lives with her mother;  wife has two sons ages 63 and 30 - younger son at home over the summer (break from college) Anticipated Caregiver: wife, Zared Camelo Ability/Limitations of Caregiver: wife reports she has back trouble which limits her to lifting less than 10#;  She states she needs her husband to be fully independent in getting up and down steps and to the bathroom. Caregiver Availability: 24/7 Family Dynamics: pt describes wife as supportive, however, does have physical limitations in assist she can provide  Social History Preferred language: English Religion: None Cultural Background: NA Education: HS Read: Yes Write: Yes Employment Status: Disabled Date Retired/Disabled/Unemployed: just approved this month and has received first payment Legal Hisotry/Current Legal Issues: none Guardian/Conservator: none  - per MD, pt capable of making decisions on his own behalf   Abuse/Neglect Physical Abuse: Denies Verbal Abuse: Denies Sexual Abuse: Denies Exploitation of patient/patient's resources: Denies Self-Neglect: Denies  Emotional Status Pt's affect, behavior adn adjustment status: pt with rather flat affect and little conversation.  Appears fatigued and admits that he has had a "full day."  Does feel that his overall strength is improved and "they think I'm doing better than they (therapies) thought I would."  Denies any s/s of significant emotional distress, however does note feeling "not quite myself (cognitively..." since surgery.  Will monitor.  May benefit from neuropsych  at some point. Recent Psychosocial Issues: Lost job in Nov 2014 due to health issues.  Married in jan 2015.  Just approved for SSD. Pyschiatric History: None Substance Abuse History: None  Patient / Family Perceptions, Expectations & Goals Pt/Family understanding of illness & functional limitations: pt and wife with basic  understanding of surgery performed and current functional limitations due to significant deconditioning/ need for CIR Premorbid pt/family roles/activities: Pt independent overall, however, physically weak due to cardiac issues. Anticipated changes in roles/activities/participation: little change anticipated if reaches mod i goals as wife is able to continue in support role Pt/family expectations/goals: "I just hope they're (therapies) right about me doing better than they thought and I can go home sooner."  [employer]: None Premorbid Home Care/DME Agencies: None Transportation available at discharge: yes Resource referrals recommended: Psychology  Discharge Planning Living Arrangements: Other (Comment);Spouse/significant other (wife's 66 year old son living with pt./wife this summer) Support Systems: Spouse/significant other;Children Type of Residence: Private residence Insurance Resources: Multimedia programmer (specify) Nurse, mental health) Financial Resources: SSD Financial Screen Referred: No Living Expenses: Higher education careers adviser Management: Spouse Does the patient have any problems obtaining your medications?: No (pt. reports he cant afford his Crestor) Home Management: pt and wife Patient/Family Preliminary Plans: pt to return home with wife and her 81 yo son providing assist as needed Social Work Anticipated Follow Up Needs: HH/OP;Other (comment) (medication assist program (Crestor)?) Expected length of stay: 10-12 days  Clinical Impression Pleasant, oriented gentleman here following CABG and with significant deconditioning.  Admits to feeling "not quite right" cognitively since surgery, however, this appears to be improving.  Will monitor and refer to neuropsych for screening if needed.  Wife supportive and willing to assist, however, limited in physical support she can provide.  Will follow for d/c planning and support needs.  Shelbee Apgar 05/18/2014, 10:21 AM

## 2014-05-18 NOTE — Progress Notes (Signed)
ANTIBIOTIC CONSULT NOTE - INITIAL  Pharmacy Consult for vancomycin Indication: wound infection  Allergies  Allergen Reactions  . Other     Pt. And wife state that he had a reaction to "some" antibiotic but they do not know what the name of it was.They are are very apprehensive about him receiving any antibiotic.    Patient Measurements: Height: 6' 0.83" (185 cm) Weight: 224 lb 4.8 oz (101.742 kg) IBW/kg (Calculated) : 79.52  Vital Signs: Temp: 101.6 F (38.7 C) (07/03 0606) Temp src: Oral (07/03 0606) BP: 117/65 mmHg (07/02 2221) Pulse Rate: 85 (07/02 2221)  Labs:  Recent Labs  05/16/14 0715  WBC 6.6  HGB 9.3*  PLT 348  CREATININE 1.42*   Estimated Creatinine Clearance: 77 ml/min (by C-G formula based on Cr of 1.42).   Microbiology: Recent Results (from the past 720 hour(s))  SURGICAL PCR SCREEN     Status: Abnormal   Collection Time    05/03/14  3:32 PM      Result Value Ref Range Status   MRSA, PCR NEGATIVE  NEGATIVE Final   Staphylococcus aureus POSITIVE (*) NEGATIVE Final   Comment:            The Xpert SA Assay (FDA     approved for NASAL specimens     in patients over 22 years of age),     is one component of     a comprehensive surveillance     program.  Test performance has     been validated by [employer] for patients greater     than or equal to 56 year old.     It is not intended     to diagnose infection nor to     guide or monitor treatment.  URINE CULTURE     Status: None   Collection Time    05/15/14  6:48 PM      Result Value Ref Range Status   Specimen Description URINE, CATHETERIZED   Final   Special Requests NONE   Final   Culture  Setup Time     Final   Value: 05/15/2014 19:28     Performed at [facility]     Final   Value: NO GROWTH     Performed at [facility]   Culture     Final   Value: NO GROWTH     Performed at [facility]   Report Status 05/16/2014 FINAL   Final     Medical History: Past Medical History  Diagnosis Date  . CAD (coronary artery disease)   . Cardiomyopathy   . Chest pain   . Congestive heart failure   . Diabetes   . Dyspnea   . Hypertension   . Peripheral vascular disease   . Diabetic foot ulcers     left foot 2nd digit ant 5 th digit 05/03/14  . Hypercholesterolemia   . Myocardial infarction   . Chronic kidney disease   . Neuropathy in diabetes     Medications:  Prescriptions prior to admission  Medication Sig Dispense Refill  . aspirin EC 325 MG EC tablet Take 1 tablet (325 mg total) by mouth daily.  30 tablet  0  . atorvastatin (LIPITOR) 40 MG tablet Take 1 tablet (40 mg total) by mouth daily at 6 PM.      . carvedilol (COREG) 3.125 MG tablet Take 1 tablet (3.125 mg total) by mouth  2 (two) times daily with a meal.      . gabapentin (NEURONTIN) 100 MG capsule Take 100 mg by mouth 2 (two) times daily.       Derrill Memo ON 05/21/2014] glyBURIDE (DIABETA) 5 MG tablet Take 3 tablets (15 mg total) by mouth daily with breakfast.      . insulin aspart (NOVOLOG FLEXPEN) 100 UNIT/ML FlexPen Inject 4-8 Units into the skin See admin instructions. Sliding scale      . oxyCODONE (OXY IR/ROXICODONE) 5 MG immediate release tablet Take 1-2 tablets (5-10 mg total) by mouth every 4 (four) hours as needed for severe pain.  30 tablet  0  . pantoprazole (PROTONIX) 40 MG tablet Take 1 tablet (40 mg total) by mouth daily before breakfast. May stop when discharged from CIR.      Marland Kitchen tamsulosin (FLOMAX) 0.4 MG CAPS capsule Take 1 capsule (0.4 mg total) by mouth daily.  30 capsule     Scheduled:  . antiseptic oral rinse  15 mL Mouth Rinse QID  . aspirin EC  325 mg Oral Daily  . atorvastatin  40 mg Oral q1800  . carvedilol  3.125 mg Oral BID WC  . cephALEXin  250 mg Oral 3 times per day  . chlorhexidine  15 mL Mouth Rinse BID  . enoxaparin (LOVENOX) injection  40 mg Subcutaneous Q24H  . gabapentin  100 mg Oral BID  . glimepiride  2 mg Oral Q  breakfast  . insulin aspart  0-24 Units Subcutaneous TID AC & HS  . insulin detemir  10 Units Subcutaneous Daily  . iron polysaccharides  150 mg Oral q12n4p  . pantoprazole  40 mg Oral QAC breakfast  . senna-docusate  2 tablet Oral QHS  . sorbitol, milk of mag, mineral oil, glycerin (SMOG) enema  960 mL Rectal Once  . vancomycin  1,500 mg Intravenous Q12H    Assessment: 53yo male was admitted 6/22 for CABG, now in inpt rehab, has had non-healing wound of left foot for quite some time and pt notes that he is supposed to have more digits amputated; this am RN was changing dressing and pt's wife noted that the wound looks worse than usual, also with foul-smelling purulent drainage, to begin IV ABX.  Goal of Therapy:  Vanc trough 10-20  Plan:  Will begin vancomycin 1500mg  IV Q12H and monitor CBC, Cx, levels prn.  Wynona Neat, PharmD, BCPS  05/18/2014,7:36 AM

## 2014-05-18 NOTE — Progress Notes (Signed)
Physical Therapy Session Note  Patient Details  Name: Zykel Ozment MRN: [id number] Date of Birth: 1961/02/23  Today's Date: 05/18/2014 Time: [id number] Time Calculation (min): 24 min  Short Term Goals: Week 1:  PT Short Term Goal 1 (Week 1): = LTG of mod I overall secondary to ELOS  Skilled Therapeutic Interventions/Progress Updates:  Pt. Supine in bed with HOB elevated. Agreeable to therapy with advisement that he is not feeling well due to pt. Reported UTI and Orthostatic difficulties this a.m. PA present and providing care on L foot.  Therapeutic Activities: Bed mobility: Scooting up in bed with RLE to raise hips with BUE elbows for upper trunk control with (S). AAROM/Stretching: In Supine with bed flat: bilateral shoulder stretching (flexion, internal rotation, external rotation) for increased functional range and patient comfort. Pt. Performed gravity assisted stretching with instruction from therapist. Pt. Stated he would continue to stretch Bilateral UEs throughout the day.  Pain: No C/o pain during treatment  Pt. Supine in bed with HOB elevated. All needs within reach and pt. Comfortable.   Therapy Documentation Precautions:  Precautions Precautions: Sternal;Fall Precaution Comments: sternal precautions Required Braces or Orthoses: Other Brace/Splint Other Brace/Splint: flat darco shoe L foot Restrictions Weight Bearing Restrictions: Yes (sternal prec)  See FIM for current functional status  Therapy/Group: Individual Therapy  Juluis Mire 05/18/2014, 8:57 AM

## 2014-05-18 NOTE — Progress Notes (Signed)
53 y.o. 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 Hospital course at that time complicated by AKI due to antibiotics as well as SOB post op and was found to have EF 15% and defibrillator placed prior to d/c. He had a stress cardiolyte in March 2015 showing a fixed inferior and reversible lateral defect with an EF of 40% and had improvement in EF and ICD removed. He has had intermittent chest discomfort as well as dizziness and falls question due to medication SE. Cardiac [facility] at [facility] revealed severe multivessel disease and he was admitted on 05/07/14 for CABG by Dr. Cyndia Bent.  Subjective/Complaints: UA Pos Feels sweaty RN notes drainage from L foot  Good response to Mg Citrate  Objective: Vital Signs: Blood pressure 117/65, pulse 85, temperature 101.6 F (38.7 C), temperature source Oral, resp. rate 18, weight 101.742 kg (224 lb 4.8 oz), SpO2 97.00%. No results found. Results for orders placed during the hospital encounter of 05/15/14 (from the past 72 hour(s))  GLUCOSE, CAPILLARY     Status: Abnormal   Collection Time    05/15/14  4:16 PM      Result Value Ref Range   Glucose-Capillary 133 (*) 70 - 99 mg/dL   Comment 1 Notify RN    URINALYSIS, ROUTINE W REFLEX MICROSCOPIC     Status: None   Collection Time    05/15/14  6:48 PM      Result Value Ref Range   Color, Urine YELLOW  YELLOW   APPearance CLEAR  CLEAR   Specific Gravity, Urine 1.010  1.005 - 1.030   pH 7.0  5.0 - 8.0   Glucose, UA NEGATIVE  NEGATIVE mg/dL   Hgb urine dipstick NEGATIVE  NEGATIVE   Bilirubin Urine NEGATIVE  NEGATIVE   Ketones, ur NEGATIVE  NEGATIVE mg/dL   Protein, ur NEGATIVE  NEGATIVE mg/dL   Urobilinogen, UA 0.2  0.0 - 1.0 mg/dL   Nitrite NEGATIVE  NEGATIVE   Leukocytes, UA NEGATIVE  NEGATIVE   Comment:  MICROSCOPIC NOT DONE ON URINES WITH NEGATIVE PROTEIN, BLOOD, LEUKOCYTES, NITRITE, OR GLUCOSE <1000 mg/dL.  URINE CULTURE     Status: None   Collection Time    05/15/14  6:48 PM      Result Value Ref Range   Specimen Description URINE, CATHETERIZED     Special Requests NONE     Culture  Setup Time       Value: 05/15/2014 19:28     Performed at [facility]       Value: NO GROWTH     Performed at [facility]   Culture       Value: NO GROWTH     Performed at [facility]   Report Status 05/16/2014 FINAL    GLUCOSE, CAPILLARY     Status: Abnormal   Collection Time    05/15/14  9:31 PM      Result Value Ref Range   Glucose-Capillary 120 (*) 70 - 99 mg/dL  GLUCOSE, CAPILLARY     Status: Abnormal   Collection Time    05/16/14  7:07 AM      Result Value Ref Range   Glucose-Capillary 104 (*) 70 - 99 mg/dL   Comment 1 Notify RN    CBC WITH DIFFERENTIAL  Status: Abnormal   Collection Time    05/16/14  7:15 AM      Result Value Ref Range   WBC 6.6  4.0 - 10.5 K/uL   RBC 3.15 (*) 4.22 - 5.81 MIL/uL   Hemoglobin 9.3 (*) 13.0 - 17.0 g/dL   HCT 28.5 (*) 39.0 - 52.0 %   MCV 90.5  78.0 - 100.0 fL   MCH 29.5  26.0 - 34.0 pg   MCHC 32.6  30.0 - 36.0 g/dL   RDW 12.6  11.5 - 15.5 %   Platelets 348  150 - 400 K/uL   Neutrophils Relative % 51  43 - 77 %   Neutro Abs 3.3  1.7 - 7.7 K/uL   Lymphocytes Relative 38  12 - 46 %   Lymphs Abs 2.5  0.7 - 4.0 K/uL   Monocytes Relative 9  3 - 12 %   Monocytes Absolute 0.6  0.1 - 1.0 K/uL   Eosinophils Relative 2  0 - 5 %   Eosinophils Absolute 0.2  0.0 - 0.7 K/uL   Basophils Relative 0  0 - 1 %   Basophils Absolute 0.0  0.0 - 0.1 K/uL  COMPREHENSIVE METABOLIC PANEL     Status: Abnormal   Collection Time    05/16/14  7:15 AM      Result Value Ref Range   Sodium 139  137 - 147 mEq/L   Potassium 5.1  3.7 - 5.3 mEq/L   Chloride 99  96 - 112 mEq/L   CO2 29  19 - 32 mEq/L   Glucose, Bld 101 (*) 70 - 99  mg/dL   BUN 28 (*) 6 - 23 mg/dL   Creatinine, Ser 1.42 (*) 0.50 - 1.35 mg/dL   Calcium 8.9  8.4 - 10.5 mg/dL   Total Protein 6.9  6.0 - 8.3 g/dL   Albumin 2.9 (*) 3.5 - 5.2 g/dL   AST 10  0 - 37 U/L   ALT 11  0 - 53 U/L   Alkaline Phosphatase 47  39 - 117 U/L   Total Bilirubin 0.4  0.3 - 1.2 mg/dL   GFR calc non Af Amer 56 (*) >90 mL/min   GFR calc Af Amer 65 (*) >90 mL/min   Comment: (NOTE)     The eGFR has been calculated using the CKD EPI equation.     This calculation has not been validated in all clinical situations.     eGFR's persistently <90 mL/min signify possible Chronic Kidney     Disease.  GLUCOSE, CAPILLARY     Status: Abnormal   Collection Time    05/16/14 11:14 AM      Result Value Ref Range   Glucose-Capillary 185 (*) 70 - 99 mg/dL   Comment 1 Notify RN    GLUCOSE, CAPILLARY     Status: Abnormal   Collection Time    05/16/14  4:08 PM      Result Value Ref Range   Glucose-Capillary 122 (*) 70 - 99 mg/dL   Comment 1 Notify RN    GLUCOSE, CAPILLARY     Status: None   Collection Time    05/16/14 10:25 PM      Result Value Ref Range   Glucose-Capillary 91  70 - 99 mg/dL  GLUCOSE, CAPILLARY     Status: Abnormal   Collection Time    05/17/14  7:08 AM      Result Value Ref Range   Glucose-Capillary 109 (*) 70 -  99 mg/dL  GLUCOSE, CAPILLARY     Status: Abnormal   Collection Time    05/17/14 11:13 AM      Result Value Ref Range   Glucose-Capillary 182 (*) 70 - 99 mg/dL   Comment 1 Notify RN    GLUCOSE, CAPILLARY     Status: Abnormal   Collection Time    05/17/14  4:43 PM      Result Value Ref Range   Glucose-Capillary 126 (*) 70 - 99 mg/dL  GLUCOSE, CAPILLARY     Status: Abnormal   Collection Time    05/17/14  9:28 PM      Result Value Ref Range   Glucose-Capillary 107 (*) 70 - 99 mg/dL  GLUCOSE, CAPILLARY     Status: None   Collection Time    05/18/14  6:04 AM      Result Value Ref Range   Glucose-Capillary 97  70 - 99 mg/dL  URINALYSIS, ROUTINE W  REFLEX MICROSCOPIC     Status: Abnormal   Collection Time    05/18/14  6:59 AM      Result Value Ref Range   Color, Urine YELLOW  YELLOW   APPearance CLOUDY (*) CLEAR   Specific Gravity, Urine 1.020  1.005 - 1.030   pH 7.5  5.0 - 8.0   Glucose, UA NEGATIVE  NEGATIVE mg/dL   Hgb urine dipstick MODERATE (*) NEGATIVE   Bilirubin Urine NEGATIVE  NEGATIVE   Ketones, ur NEGATIVE  NEGATIVE mg/dL   Protein, ur 100 (*) NEGATIVE mg/dL   Urobilinogen, UA 1.0  0.0 - 1.0 mg/dL   Nitrite POSITIVE (*) NEGATIVE   Leukocytes, UA LARGE (*) NEGATIVE  URINE MICROSCOPIC-ADD ON     Status: Abnormal   Collection Time    05/18/14  6:59 AM      Result Value Ref Range   Squamous Epithelial / LPF RARE  RARE   WBC, UA TOO NUMEROUS TO COUNT  <3 WBC/hpf   RBC / HPF 11-20  <3 RBC/hpf   Bacteria, UA MANY (*) RARE      Constitutional: He is oriented to person, place, and time. He appears well-developed and well-nourished.  HENT:  Head: Normocephalic and atraumatic.  Eyes: Conjunctivae are normal. Pupils are equal, round, and reactive to light.  Neck: Normal range of motion. Neck supple.  Cardiovascular: Normal rate and regular rhythm.  Murmur heard.  Respiratory: Effort normal. No accessory muscle usage. No respiratory distress. He has decreased breath sounds in the left lower field. He has no wheezes.  GI: Soft. Bowel sounds are normal. He exhibits no distension. There is no tenderness.  Musculoskeletal:  Left foot with well healed incision on surface and small callus lateral to 5th MTP, no erythema min clear drainage. S/p 3rd and 4th toe amputation.  Neurological: He is alert and oriented to person, place, and time.  Speech soft but clear. Follows commands without difficulty. BUE's 4/5 prox to distal. LLE 2/5 HF, KE, foot, RLE 3/5 HF, KE and foot. Decreased PP and LT in both feet and fingers.  Skin: Skin is warm and dry.  RLE graft incisions with skin glue.    Assessment/Plan: 1. Functional deficits  secondary to deconditioning after CABG which require 3+ hours per day of interdisciplinary therapy in a comprehensive inpatient rehab setting. Physiatrist is providing close team supervision and 24 hour management of active medical problems listed below. Physiatrist and rehab team continue to assess barriers to discharge/monitor patient progress toward functional and medical goals.  FIM: FIM - Bathing Bathing Steps Patient Completed: Chest;Right Arm;Left Arm;Abdomen;Front perineal area;Buttocks;Right upper leg;Left upper leg Bathing: 4: Min-Patient completes 8-9 66f10 parts or 75+ percent  FIM - Upper Body Dressing/Undressing Upper body dressing/undressing steps patient completed: Thread/unthread right sleeve of pullover shirt/dresss;Put head through opening of pull over shirt/dress;Thread/unthread left sleeve of pullover shirt/dress;Pull shirt over trunk Upper body dressing/undressing: 5: Set-up assist to: Obtain clothing/put away FIM - Lower Body Dressing/Undressing Lower body dressing/undressing steps patient completed: Thread/unthread right pants leg;Thread/unthread left pants leg;Pull pants up/down;Don/Doff right shoe;Don/Doff left shoe;Fasten/unfasten right shoe;Fasten/unfasten left shoe Lower body dressing/undressing: 4: Min-Patient completed 75 plus % of tasks  FIM - Toileting Toileting steps completed by patient: Performs perineal hygiene;Adjust clothing after toileting Toileting Assistive Devices: Grab bar or rail for support Toileting: 3: Mod-Patient completed 2 of 3 steps  FIM - TRadio producerDevices: Grab bars Toilet Transfers: 4-To toilet/BSC: Min A (steadying Pt. > 75%);4-From toilet/BSC: Min A (steadying Pt. > 75%)  FIM - Bed/Chair Transfer Bed/Chair Transfer Assistive Devices: WCopy 5: Supine > Sit: Supervision (verbal cues/safety issues);4: Bed > Chair or W/C: Min A (steadying Pt. > 75%)  FIM - Locomotion:  Wheelchair Distance: 150 Locomotion: Wheelchair: 0: Activity did not occur FIM - Locomotion: Ambulation Locomotion: Ambulation Assistive Devices: WAdministratorAmbulation/Gait Assistance: 4: Min assist Locomotion: Ambulation: 0: Activity did not occur  Comprehension Comprehension Mode: Auditory Comprehension: 6-Follows complex conversation/direction: With extra time/assistive device  Expression Expression Mode: Verbal Expression: 6-Expresses complex ideas: With extra time/assistive device  Social Interaction Social Interaction: 6-Interacts appropriately with others with medication or extra time (anti-anxiety, antidepressant).  Problem Solving Problem Solving: 6-Solves complex problems: With extra time  Memory Memory: 6-More than reasonable amt of time  Medical Problem List and Plan:  1. Functional deficits secondary to Deconditioning after CABG and multiple medical  2. DVT Prophylaxis/Anticoagulation: Pharmaceutical: Lovenox  3. Pain Management: prn oxycodone is effective.  4. Mood: Has a supportive wife who is off for the summer. LCSW to follow for evaluation and support.  5. Neuropsych: This patient is capable of making decisions on his own behalf.  6. Fluid overload: Low salt diet. Check daily weights as well as close monitoring for signs of overload. Off lasix at this time. 7. DM type 2: Glyburide on hold due to renal insufficiency---will change to amaryl and wean off levemir. Monitor BS with ac/hs checks and use SSI for elevated BS. Titrate oral medication as indicated.  8. PVD with multiple left foot ulcers: Continue DARCO shoe for pressure relief.  9. ABLA: H/H with slow improvement.  Add iron supplement  10. Acute on CKD: Will continue to monitor renal status for now. Push po fluids. Avoid nephrotoxic medications  11. Urinary retention: discontinue foley voiding trial  Continue flomax. UA neg  12. CAD: Continue coreg, lipitor and ASA.  13. Constipation: improved ,  monitor  14.orthostatic hypotension, adjust med  LOS (Days) 3 A FACE TO FACE EVALUATION WAS PERFORMED  KIRSTEINS,ANDREW E 05/18/2014, 7:32 AM

## 2014-05-18 NOTE — IPOC Note (Addendum)
Overall Plan of Care [facility] Patient Details Name: [name] [name] MRN: [id number] DOB: Jun 25, 1961  Admitting Diagnosis: cabg deconditioning  Hospital Problems: Active Problems:   Physical deconditioning     Functional Problem List: Nursing Endurance;Medication Management;Safety  PT Balance;Endurance;Pain;Sensory;Motor  OT Balance;Edema;Endurance;Motor;Pain;Perception;Safety;Sensory;Skin Integrity;Vision  SLP    TR  Activity tolerance, functional mobility, balance, cognition, safety,        Basic ADL's: OT Grooming;Bathing;Dressing;Toileting     Advanced  ADL's: OT Simple Meal Preparation     Transfers: PT Bed Mobility;Bed to Chair;Car;Furniture  OT Toilet;Tub/Shower     Locomotion: PT Ambulation;Stairs     Additional Impairments: OT None (strengthening > BUEs)  SLP        TR      Anticipated Outcomes Item Anticipated Outcome  Self Feeding independent  Swallowing      Basic self-care  mod I  Toileting  mod I   Bathroom Transfers mod I  Bowel/Bladder  Mod assist for bowel; Max for bladder  Transfers  Mod I  Locomotion  Mod I  Communication     Cognition     Pain  Pain managed at or below 3  Safety/Judgment  Min Assist   Therapy Plan: PT Intensity: Minimum of 1-2 x/day ,45 to 90 minutes PT Frequency: 5 out of 7 days PT Duration Estimated Length of Stay: 10-12 days OT Intensity: Minimum of 1-2 x/day, 45 to 90 minutes OT Frequency: 5 out of 7 days OT Duration/Estimated Length of Stay: 10-12 days         Team Interventions: Nursing Interventions Patient/Family Education;Bladder Management;Bowel Management;Pain Management;Medication Management;Skin Care/Wound Management;Psychosocial Support;Discharge Planning  PT interventions Ambulation/gait training;Balance/vestibular training;Discharge planning;Disease management/prevention;DME/adaptive equipment instruction;Functional mobility training;Pain management;Patient/family education;Psychosocial  support;Stair training;Therapeutic Activities;Therapeutic Exercise;UE/LE Strength taining/ROM  OT Interventions Balance/vestibular training;Community reintegration;Discharge planning;Disease mangement/prevention;DME/adaptive equipment instruction;Functional mobility training;Psychosocial support;Patient/family education;Pain management;Self Care/advanced ADL retraining;Skin care/wound managment;Splinting/orthotics;UE/LE Strength taining/ROM;Therapeutic Exercise;Therapeutic Activities;Wheelchair propulsion/positioning;UE/LE Coordination activities  SLP Interventions    TR Interventions Recreation/leisure participation, Balance/Vestibular training, functional mobility, therapeutic activities, UE/LE strength/coordination, w/c mobility, community reintegration, pt/family education, adaptive equipment instruction/use, discharge planning, psychosocial support  SW/CM Interventions Psychosocial Support;Patient/Family Education;Discharge Planning;Disease Management/Prevention    Team Discharge Planning: Destination: PT-Home ,OT- Home , SLP-  Projected Follow-up: PT-Home health PT, OT-  Other (comment) (OT TBD), SLP-  Projected Equipment Needs: PT-Wheelchair (measurements);Wheelchair cushion (measurements), OT- 3 in 1 bedside comode;Tub/shower seat;Tub/shower bench, SLP-  Equipment Details: PT-w/c for community distances, OT-tub transfer bench vs shower seat Patient/family involved in discharge planning: PT- Patient,  OT-Patient, SLP-   MD ELOS: 7-10d Medical Rehab Prognosis:  Good Assessment: Cardiac cath at [facility] revealed severe multivessel disease and he was admitted on 05/07/14 for CABG by Dr. Cyndia Bent. Post op pulmonary edema treated with diuresis and ABLA being monitored.   Now requiring 24/7 Rehab RN,MD, as well as CIR level PT, OT .  Treatment team will focus on ADLs, endurance and mobility with goals set at Mod I   See Team Conference Notes for weekly updates to the plan of care

## 2014-05-18 NOTE — Progress Notes (Signed)
Physical Therapy Session Note  Patient Details  Name: [name] [name] MRN: [id number] Date of Birth: 02-21-1961  Today's Date: 05/18/2014 Time: 1400-1520 Time Calculation (min): 80 min  Short Term Goals: Week 1:  PT Short Term Goal 1 (Week 1): = LTG of mod I overall secondary to ELOS  Skilled Therapeutic Interventions/Progress Updates:   Pt. In W/C with family present (wife and daughter). Pt. Ready for therapy; family have questions on care and mobility.  NMReEd: Incorporated balance activities during gait to increase patients proprioception, both dynamic and static, for 2 x 150 feet ambulation trials. Pt. Demonstrates improved technique and requires min A verbal cues for step/stance width during activities. Pt. Requires constant verbal cues for posture in both ambulation and sitting balance trials.  Therapeutic Activities: Transfers bed<>w/c x4, sit<>stand x12, sit>supine x1 incorporating bed mobility and self repositioning. All transfers min to mod A for safety with NDT techniques incorporated for weight shifting and task repetition. Pt. Demonstrates carry-over after therapeutic rest/recovery breaks that occurred during session.  Self-care: Family educated on patient mobility, functional tasks, proper techniques, and coached on keeping patient active and not spending hours in bed, etc. On return home. Posture cues, standing cues, energy conservation and movement strategies were also incorporated throughout the treatment session for the patient and the family members.  Pt. Denies or has no c/o pain pre, during, or post tx.  Pt. Supine in bed with HOB elevated with all needs present. Wife is present and requests door to room be closed.  Therapy Documentation Precautions:  Precautions Precautions: Sternal;Fall Precaution Comments: sternal precautions Required Braces or Orthoses: Other Brace/Splint Other Brace/Splint: flat darco shoe L foot Restrictions Weight Bearing Restrictions: Yes  (sternal prec) Locomotion : Ambulation Ambulation/Gait Assistance: 4: Min assist   See FIM for current functional status  Therapy/Group: Individual Therapy  Juluis Mire 05/18/2014, 5:06 PM

## 2014-05-18 NOTE — Progress Notes (Signed)
Occupational Therapy Session Notes  Patient Details  Name: [name] [name] MRN: [id number] Date of Birth: 03-18-1961  Today's Date: 05/18/2014  Short Term Goals: Week 1:  OT Short Term Goal 1 (Week 1): Patient will perform sit<>stand with supervision in order to increase independence with LB ADLs OT Short Term Goal 2 (Week 1): Patient will perform toilet transfers with supervision OT Short Term Goal 3 (Week 1): Patient will perform UB/LB dressing with supervision OT Short Term Goal 4 (Week 1): Patient will adere to sternal precautions at least 75% of the time independently  Skilled Therapeutic Interventions/Progress Updates:   Session #1 [phone] - 37 Minutes Missed 23 Minutes secondary to complaints of dizziness and not feeling well; RN aware Individual Therapy No direct complaints of pain Patient received supine in bed with RN and DR present. Patient with fever last night and orthostatic hypotension. Therapist checked patient's BP=126/59 in supine position. Patient then engaged in bed mobility with difficulty and breaking sternal precautions. Therapist educated patient on sternal precautions, however patient kept pushing with BUEs to go from side lying > sit. Therapist provided education regarding this. Patient sat EOB and BP=111/54 and patient with minimal complaints of dizziness/lightheadedness. Patient then transferred EOB>w/c with close supervision (squat pivot technique). Patient eager to perform ADL this am. Patient propelled self to sink and engaged in UB/LB bathing & dressing tasks in sit<>stand position and grooming tasks in seated position. BP rechecked=107/56. Patient requested to transfer back to bed secondary to not feeling well. Patient required min verbal cues for correct w/c placement and transferred w/c > EOB with supervision. Therapist left all needs within reach and notified RN of missed time. This OT discussed patient's decrease in bed mobility independence with primary  PT.  Session #2 [id number] - 13 Minutes Individual Therapy Patient with no complaints of pain Patient received supine in bed. Patient engaged in bed mobility with supervision, moderate verbal cues to adhere to sternal precautions. Patient then sat EOB, donned socks and shoes and then transferred > w/c, stand pivot technique with supervision. Patient with no complaints of dizziness or lightheadedness. Patient propelled self from room > therapy gym and transferred onto NuStep machine, patient required moderate verbal cues to correctly place w/c for a safe & effective transfer onto machine. BP=137/79 once patient seated on NuStep. Patient completed therapeutic exercise on NuStep for 10 minutes of level 4. Patient transferred back to w/c>therapy mat and therapist engaged patient in therapeutic activity focusing on bed mobility and adhering to sternal precautions during bed mobility. Therapist had patient hold onto pillow and this seemed to increase patient's independence with this task while adhering to precautions. Patient transferred back to w/c and propelled self back to room. Therapist left patient seated in w/c with all needs within reach.   Precautions:  Precautions Precautions: Sternal;Fall Precaution Comments: sternal precautions Required Braces or Orthoses: Other Brace/Splint Other Brace/Splint: flat darco shoe L foot Restrictions Weight Bearing Restrictions: Yes (sternal precautions)  See FIM for current functional status  [name] [name] 05/18/2014, 7:21 AM

## 2014-05-18 NOTE — Consult Note (Signed)
WOC wound follow up Asked to reeval this patient wound on the left plantar foot and new wound left lateral foot.  Wound type: Neuropathic foot ulceration x 2. Pt with extensive history in the left foot, multiple surgeries including amputations and use of NPWT dressing. Hx of osteomyelitis and MRSA. Pt with significant peripheral neuropathy and hx of  PAD.  When I saw wound at the time of his admission was open on the plantar surface minimally with no undermining  Measurement: Open area is 1.0cm x 0.75cm x 1.0cm and now has undermining circumferentially that is at least 1.0 cm.  Wound [id number] base with hard hyperkeratotic callous surrounding the centrally open area.  New area lateral left foot: was calleoused skin; removed by PA now clean, pink, base. No depth.  Drainage (amount, consistency, odor) minimal at the time of my assessment  Periwound: see notes, but no erythema or otherwise indication of soft tissue infection Dressing procedure/placement/frequency: Silver hydrofiber to be packed into the wound bed on the plantar surface, making sure to pack into the undermined area.  And to be applied to the surface of the left lateral foot wound.   Discussed care with rehab PA, suggested xray for update on status of this foot with his history of osteomylitis.  She agreed.  May need follow up quicker than expected with podiatry wound worsens.    WOC will follow along with you for weekly wound assessments Noela Brothers Lynita Lombard, [place]

## 2014-05-18 NOTE — Progress Notes (Signed)
Foot wound examined--left later foot callus with dry skin that flaked off with clean beefy red base. Left diabetic ulcer under 2nd toe with small amount of purulent drainage expressed. Patient with 1 cm X 1 cm opening with tracking upto 1.5 cm at 3 o' clock. Had not been packed as had closed up superficially.  Will change to iodoform guaze to help with packing. Cover later callused area with Aquacell dressing to absorb any drainage. Will have WOC re-eval  for input

## 2014-05-19 ENCOUNTER — Encounter (HOSPITAL_COMMUNITY): Payer: BC Managed Care – PPO | Admitting: Occupational Therapy

## 2014-05-19 ENCOUNTER — Inpatient Hospital Stay (HOSPITAL_COMMUNITY): Payer: BC Managed Care – PPO

## 2014-05-19 ENCOUNTER — Inpatient Hospital Stay (HOSPITAL_COMMUNITY): Payer: BC Managed Care – PPO | Admitting: Physical Therapy

## 2014-05-19 DIAGNOSIS — I2510 Atherosclerotic heart disease of native coronary artery without angina pectoris: Secondary | ICD-10-CM

## 2014-05-19 DIAGNOSIS — R5381 Other malaise: Secondary | ICD-10-CM

## 2014-05-19 DIAGNOSIS — E1165 Type 2 diabetes mellitus with hyperglycemia: Secondary | ICD-10-CM

## 2014-05-19 DIAGNOSIS — IMO0001 Reserved for inherently not codable concepts without codable children: Secondary | ICD-10-CM

## 2014-05-19 DIAGNOSIS — Z951 Presence of aortocoronary bypass graft: Secondary | ICD-10-CM

## 2014-05-19 DIAGNOSIS — I200 Unstable angina: Secondary | ICD-10-CM

## 2014-05-19 DIAGNOSIS — K5900 Constipation, unspecified: Secondary | ICD-10-CM

## 2014-05-19 LAB — GLUCOSE, CAPILLARY
GLUCOSE-CAPILLARY: 103 mg/dL — AB (ref 70–99)
Glucose-Capillary: 153 mg/dL — ABNORMAL HIGH (ref 70–99)
Glucose-Capillary: 88 mg/dL (ref 70–99)
Glucose-Capillary: 92 mg/dL (ref 70–99)

## 2014-05-19 MED ORDER — LACTULOSE 10 GM/15ML PO SOLN
30.0000 g | Freq: Every day | ORAL | Status: DC | PRN
  Administered 2014-05-21: 30 g via ORAL
  Filled 2014-05-19 (×2): qty 45

## 2014-05-19 NOTE — Progress Notes (Signed)
Physical Therapy Session Note  Patient Details  Name: [name] [name] MRN: [id number] Date of Birth: 1961-07-23  Today's Date: 05/19/2014 Time: 1345-1430, [id number] Time Calculation (min): 45 min, 30 min  Short Term Goals: Week 1:  PT Short Term Goal 1 (Week 1): = LTG of mod I overall secondary to ELOS  Skilled Therapeutic Interventions/Progress Updates:   Pt demonstrates improvements in session requiring decreased assist for transfers. Pt able to carryover ventilation cues to decrease valsalva with weight shifting. Pt with decreased glute activation noted in session limiting ability to perform transfers while maintaining sternal precautions. Pt with some limited improvement noted s/p manual techniques and neuro re-ed but unable to carryover into function in session. Pt would continue to benefit from skilled PT services to increase functional mobility.  Therapy Documentation Precautions:  Precautions Precautions: Sternal;Fall Precaution Comments: sternal precautions Required Braces or Orthoses: Other Brace/Splint Other Brace/Splint: flat darco shoe L foot Restrictions Weight Bearing Restrictions: No Other Position/Activity Restrictions: offload lt metatarsals as able Pain: Pain Assessment Pain Assessment: 0-10 Pain Score: 5  Pain Type: Acute pain Pain Location: Leg Pain Orientation: Right Pain Descriptors / Indicators: Aching;Sore Pain Frequency: Intermittent Pain Onset: On-going Patients Stated Pain Goal: 2 Pain Intervention(s): Medication (See eMAR) Mobility:  Pt performs transfers Mod A with cues for weight shift, glute activation, core activation, and ventilation Locomotion : Ambulation Ambulation/Gait Assistance: 4: Min assist Wheelchair Mobility Distance: 100  Other Treatments:  Treatment 1: Pt performs weight shifting 4x10 with cues for ventilation. Pt performs swiss ball roll-outs 2x10, followed by swiss ball roll-outs 2x10 with glute clearance. Piriformis release  performed B/L. Glute sets 2x10 performed. Repeated sit to stands x5 Pt and wife educated on avoiding valsalva, rehab plan, and progressive mobility.  Treatment 2: Pt performs weight shifts with cues/facilitation of neutral spine 2x10. Repeated sit to stands performed 2x5. Pt performs standing repeated L/S ext 2x10, seated anterior pelvic tilt 2x10. L/S soft tissue stretch performed 3x30". Stairs performed with cues for sequencing and hand placement 4 steps with R rail. Pt and wife educated on rehab process. Pt left sitting in wheelchair with needs with wife.  See FIM for current functional status  Therapy/Group: Individual Therapy  Monia Pouch 05/19/2014, 4:16 PM

## 2014-05-19 NOTE — Progress Notes (Addendum)
Physical Therapy Note  Patient Details  Name: [name] [name] MRN: [id number] Date of Birth: March 06, 1961 Today's Date: 05/19/2014 1000-1100, 60 min individual therapy  Therapeutic activities in sitting: scooting L><R with mod assist without use of bil UEs, either folded across chest or Bobath style in front of pt.  Dynamic sitting balance/trunk strengthening in unsupported sitting, feet off floor reaching in all directions with L and R hand, with unsteadiness but not LOB. Pelvic tilts x 5 in sitting without use of UEs and sit> stand x 5 without use of UEs with mod assist.  Gait training with RW x 87' x 2, Min guard assist, cues for upright trunk and forward gaze.   Therapetic ex on NuStep at level 4 x 10 minutes, rated 13 on BORG; cool down at level 1 x 2 minutes; ; supine bil bridging, lower trunk rotation x 10 each. Also, w/c propulsion x 100' modified independent, using bil LEs for strengthening/activity tolerance.  Pt resting in bed with all needs in reach at end of session.  [name] 05/19/2014, 8:14 AM

## 2014-05-19 NOTE — Progress Notes (Signed)
Occupational Therapy Session Note  Patient Details  Name: [name] [name] MRN: [id number] Date of Birth: Mar 26, 1961  Today's Date: 05/19/2014 Time: 0905-1000 Time Calculation (min): 55 min  Short Term Goals: Week 1:  OT Short Term Goal 1 (Week 1): Patient will perform sit<>stand with supervision in order to increase independence with LB ADLs OT Short Term Goal 2 (Week 1): Patient will perform toilet transfers with supervision OT Short Term Goal 3 (Week 1): Patient will perform UB/LB dressing with supervision OT Short Term Goal 4 (Week 1): Patient will adere to sternal precautions at least 75% of the time independently  Skilled Therapeutic Interventions/Progress Updates:  Patient received seated in w/c with no complaints of pain. Patient feeling better today as opposed to yesterday. Therapist handed patient darco boot and patient donned with set-up assistance. Patient then stood with RW and ambulated into bathroom with minimal assistance for shower stall transfer. Patient with LOB episode when ambulating and therapist assisted with re-correcting for safety and to decrease risk of fall. Therapist covered LLE. Patient completed UB/LB bathing in seated position in shower stall on tub transfer bench, no standing secondary to patient not wearing darco boot. Patient dried, then ambulated back to w/c for UB/LB dressing in sit<>stand position. Patient then completed grooming tasks while seated in w/c. Patient with decreased RW safety awareness and requires min>mod multimodal cues to perform safe & effective ambulation, sit<>stands, and transfers with RW. Patient also with decreased dynamic standing balance which increases his risk of falls using RW. After grooming, patient propelled self > therapy gym. Patient transferred w/c > therapy mat with RW. Therapist educated patient on importance of using RW for stand pivot transfers. Patient worked on sit<>stands without UE support (putting bilateral hands on knees) and  BUE strengthening exercises using 2lb dumbbells; therapist made sure patient adhered to sternal precautions during exercises. At end of session, left patient seated edge of mat with PT present for next therapy session.   Precautions:  Precautions Precautions: Sternal;Fall Precaution Comments: sternal precautions Required Braces or Orthoses: Other Brace/Splint Other Brace/Splint: flat darco shoe L foot Restrictions Weight Bearing Restrictions: No  See FIM for current functional status  Therapy/Group: Individual Therapy  Menaal Russum 05/19/2014, 10:04 AM

## 2014-05-19 NOTE — Plan of Care (Signed)
Problem: RH KNOWLEDGE DEFICIT Goal: RH STG INCREASE KNOWLEDGE OF DIABETES Patient & family will be able to identify signs and symptoms of hypo/hyperglycemia, its management & treatment, as well as demonstration of proper blood glucose monitoring & administration techniques.  Reinforcement needed    Goal: RH STG INCREASE KNOWLEDGE OF HYPERTENSION Patient & family will be able to identify signs and symptoms of hypo/hypertension, and will be able to describe information about medication, dosage, schedule, purpose and adverse effects.  Reinforcement needed

## 2014-05-19 NOTE — Progress Notes (Signed)
[name] [name] is a 53 y.o. male 1961/07/18 [id number]  Subjective: C/o constipation  Slept well. Feeling OK.  Objective: Vital signs in last 24 hours: Temp:  [98.2 F (36.8 C)-98.7 F (37.1 C)] 98.2 F (36.8 C) (07/04 0635) Pulse Rate:  [81] 81 (07/03 2100) Resp:  [18-19] 19 (07/04 0635) BP: (118-127)/(64-68) 127/64 mmHg (07/03 2100) SpO2:  [98 %] 98 % (07/04 0635) Weight:  [225 lb 9.6 oz (102.331 kg)] 225 lb 9.6 oz (102.331 kg) (07/04 0635) Weight change: 1 lb 4.8 oz (0.59 kg) Last BM Date: 05/16/14  Intake/Output from previous day: 07/03 0701 - 07/04 0700 In: 840 [P.O.:840] Out: 2000 [Urine:2000] Last cbgs: CBG (last 3)   Recent Labs  05/18/14 1648 05/18/14 2135 05/19/14 0729  GLUCAP 84 76 103*     Physical Exam General: No apparent distress   HEENT: not dry Lungs: Normal effort. Lungs clear to auscultation, no crackles or wheezes. Cardiovascular: Regular rate and rhythm, no edema Abdomen: S/NT/ND; BS(+) Musculoskeletal:  unchanged Neurological: No new neurological deficits Wounds: clean    Skin: clear  Mental state: Alert, oriented, cooperative    Lab Results: BMET    Component Value Date/Time   NA 139 05/16/2014 0715   K 5.1 05/16/2014 0715   CL 99 05/16/2014 0715   CO2 29 05/16/2014 0715   GLUCOSE 101* 05/16/2014 0715   BUN 28* 05/16/2014 0715   CREATININE 1.42* 05/16/2014 0715   CALCIUM 8.9 05/16/2014 0715   GFRNONAA 56* 05/16/2014 0715   GFRAA 65* 05/16/2014 0715   CBC    Component Value Date/Time   WBC 16.8* 05/18/2014 1120   RBC 3.20* 05/18/2014 1120   HGB 9.4* 05/18/2014 1120   HCT 28.9* 05/18/2014 1120   PLT 344 05/18/2014 1120   MCV 90.3 05/18/2014 1120   MCH 29.4 05/18/2014 1120   MCHC 32.5 05/18/2014 1120   RDW 12.9 05/18/2014 1120   LYMPHSABS 1.3 05/18/2014 1120   MONOABS 1.3* 05/18/2014 1120   EOSABS 0.1 05/18/2014 1120   BASOSABS 0.0 05/18/2014 1120    Studies/Results: Dg Chest 2 View  05/18/2014   CLINICAL DATA:  Fever and UTIs  EXAM: CHEST  2 VIEW  COMPARISON:   05/14/2014  FINDINGS: Cardiac shadow is mildly enlarged. Postsurgical changes are again seen. No focal infiltrate or sizable effusion is noted. No bony abnormality is seen.  IMPRESSION: No acute abnormality noted.   Electronically Signed   By: Inez Catalina M.D.   On: 05/18/2014 10:12   Dg Foot 2 Views Left  05/18/2014   CLINICAL DATA:  Plantar ulcer with drainage  EXAM: LEFT FOOT - 2 VIEW  COMPARISON:  07/25/2013  FINDINGS: There is been amputation of the third and fourth toes at the distal metatarsal level. Age is of those amputations appear unremarkable. There has been lytic destructive change affecting the second metatarsal head and the proximal phalanges of the second and fifth toes. Great toe does not show any abnormality.  IMPRESSION: Interval amputation of the third and fourth toes. Evidence of osteomyelitis affecting the metatarsal head of the second toe and the proximal phalanges of the second and fifth toes.   Electronically Signed   By: Nelson Chimes M.D.   On: 05/18/2014 12:48    Medications: I have reviewed the patient's current medications.  Assessment/Plan:  1. Functional deficits secondary to Deconditioning after CABG and multiple medical  2. DVT Prophylaxis/Anticoagulation: Pharmaceutical: Lovenox  3. Pain Management: prn oxycodone is effective.  4. Mood: Has a supportive wife who  is off for the summer. LCSW to follow for evaluation and support.  5. Neuropsych: This patient is capable of making decisions on his own behalf.  6. Fluid overload: Low salt diet. Check daily weights as well as close monitoring for signs of overload. Off lasix at this time. 7. DM type 2: Glyburide on hold due to renal insufficiency---will change to amaryl and wean off levemir. Monitor BS with ac/hs checks and use SSI for elevated BS. Titrate oral medication as indicated.  8. PVD with multiple left foot ulcers: Continue DARCO shoe for pressure relief.  9. ABLA: H/H with slow improvement. Add iron supplement   10. Acute on CKD: Will continue to monitor renal status for now. Push po fluids. Avoid nephrotoxic medications  11. Urinary retention: discontinue foley voiding trial Continue flomax. UA neg  12. CAD: Continue coreg, lipitor and ASA.  13. Constipation: improved , monitor - see Meds 14.orthostatic hypotension, adjust med      Length of stay, days: [place] , MD 05/19/2014, 8:20 AM

## 2014-05-20 ENCOUNTER — Inpatient Hospital Stay (HOSPITAL_COMMUNITY): Payer: BC Managed Care – PPO | Admitting: Physical Therapy

## 2014-05-20 LAB — URINE CULTURE: Colony Count: >=100000

## 2014-05-20 LAB — GLUCOSE, CAPILLARY
GLUCOSE-CAPILLARY: 151 mg/dL — AB (ref 70–99)
GLUCOSE-CAPILLARY: 123 mg/dL — AB (ref 70–99)
Glucose-Capillary: 127 mg/dL — ABNORMAL HIGH (ref 70–99)
Glucose-Capillary: 113 mg/dL — ABNORMAL HIGH (ref 70–99)

## 2014-05-20 LAB — WOUND CULTURE: Special Requests: NORMAL

## 2014-05-20 NOTE — Progress Notes (Signed)
Physical Therapy Session Note  Patient Details  Name: [name] [name] MRN: [id number] Date of Birth: 10/06/1961  Today's Date: 05/20/2014 Time: [id number] Time Calculation (min): 45 min  Short Term Goals: Week 1:  PT Short Term Goal 1 (Week 1): = LTG of mod I overall secondary to ELOS  Skilled Therapeutic Interventions/Progress Updates:    Pt demonstrates improvement able to perform with to stands without UE on mat. Pt limited in session by orthostasis, with symptoms worst in sustained standing. Pt able to tolerate other activities with rest breaks including sit to stands with minimal provocation of symptoms. Pt with L quad tightness limiting full knee ext that improves with patellar mobs and quad soft tissue release.  Therapy Documentation Precautions:  Precautions Precautions: Sternal;Fall Precaution Comments: sternal precautions Required Braces or Orthoses: Other Brace/Splint Other Brace/Splint: flat darco shoe L foot Restrictions Weight Bearing Restrictions: No Other Position/Activity Restrictions: offload lt metatarsals as able General:   Vital Signs: Therapy Vitals BP: 88/60 mmHg (sitting BP 118/68 and BP 122/63 Pulse 84 sitting s/p activity) Patient Position (if appropriate): Standing Pain: Pain Assessment Pain Assessment: 0-10 Pain Score: 3  Pain Location: Hip Pain Orientation: Left Pain Intervention(s): Other (Comment) (manual and graded activity) Mobility:  Pt is MinGuard for all transfers in session with cues for weight shift, ventilation, and technique Balance:  Min Guard for static and dynamic balance to mod excursion Other Treatments:  Pt performs repeated sit to stands 5x3 in session. Pt educated on orthostasis and hypotension and possible contributions. Therex for orthostasis discussed. Legs crossed with hip add isometrics performed 2x10, manually resisted LAQs 2x10, and hip abd isometrics manually resisted 2x10. Patellar mobs, quad release performed on LLE  followed by LAQs 3x10 with cues for ventilation. HS stretch 3x30" with contract relax performed. Static standing 2'x2 performed.  See FIM for current functional status  Therapy/Group: Individual Therapy  Monia Pouch 05/20/2014, 11:03 AM

## 2014-05-20 NOTE — Progress Notes (Signed)
[name] [name] is a 53 y.o. male [birth year] [id number]  Subjective: C/o constipation - better  Slept well. Feeling OK.  Objective: Vital signs in last 24 hours: Temp:  [97.9 F (36.6 C)-98.2 F (36.8 C)] 97.9 F (36.6 C) (07/05 [id number]) Pulse Rate:  [78-107] 78 (07/04 2113) Resp:  [18-19] 19 (07/05 0608) BP: (102-124)/(62-76) 124/62 mmHg (07/04 2113) SpO2:  [97 %-99 %] 97 % (07/05 0608) Weight:  [224 lb 3.2 oz (101.696 kg)] 224 lb 3.2 oz (101.696 kg) (07/05 [id number]) Weight change: -1 lb 6.4 oz (-0.635 kg) Last BM Date: 05/19/14  Intake/Output from previous day: 07/04 0701 - 07/05 0700 In: 720 [P.O.:720] Out: 1450 [Urine:1450] Last cbgs: CBG (last 3)   Recent Labs  05/19/14 1622 05/19/14 2058 05/20/14 0718  GLUCAP 88 92 127*     Physical Exam General: No apparent distress   HEENT: not dry Lungs: Normal effort. Lungs clear to auscultation, no crackles or wheezes. Cardiovascular: Regular rate and rhythm, no edema Abdomen: S/NT/ND; BS(+) Musculoskeletal:  unchanged Neurological: No new neurological deficits Wounds: clean    Skin: clear  Mental state: Alert, oriented, cooperative    Lab Results: BMET    Component Value Date/Time   NA 139 05/16/2014 0715   K 5.1 05/16/2014 0715   CL 99 05/16/2014 0715   CO2 29 05/16/2014 0715   GLUCOSE 101* 05/16/2014 0715   BUN 28* 05/16/2014 0715   CREATININE 1.42* 05/16/2014 0715   CALCIUM 8.9 05/16/2014 0715   GFRNONAA 56* 05/16/2014 0715   GFRAA 65* 05/16/2014 0715   CBC    Component Value Date/Time   WBC 16.8* 05/18/2014 1120   RBC 3.20* 05/18/2014 1120   HGB 9.4* 05/18/2014 1120   HCT 28.9* 05/18/2014 1120   PLT 344 05/18/2014 1120   MCV 90.3 05/18/2014 1120   MCH 29.4 05/18/2014 1120   MCHC 32.5 05/18/2014 1120   RDW 12.9 05/18/2014 1120   LYMPHSABS 1.3 05/18/2014 1120   MONOABS 1.3* 05/18/2014 1120   EOSABS 0.1 05/18/2014 1120   BASOSABS 0.0 05/18/2014 1120    Studies/Results: Dg Chest 2 View  05/18/2014   CLINICAL DATA:  Fever and UTIs  EXAM: CHEST   2 VIEW  COMPARISON:  05/14/2014  FINDINGS: Cardiac shadow is mildly enlarged. Postsurgical changes are again seen. No focal infiltrate or sizable effusion is noted. No bony abnormality is seen.  IMPRESSION: No acute abnormality noted.   Electronically Signed   By: Inez Catalina M.D.   On: 05/18/2014 10:12   Dg Foot 2 Views Left  05/18/2014   CLINICAL DATA:  Plantar ulcer with drainage  EXAM: LEFT FOOT - 2 VIEW  COMPARISON:  07/25/2013  FINDINGS: There is been amputation of the third and fourth toes at the distal metatarsal level. Age is of those amputations appear unremarkable. There has been lytic destructive change affecting the second metatarsal head and the proximal phalanges of the second and fifth toes. Great toe does not show any abnormality.  IMPRESSION: Interval amputation of the third and fourth toes. Evidence of osteomyelitis affecting the metatarsal head of the second toe and the proximal phalanges of the second and fifth toes.   Electronically Signed   By: Nelson Chimes M.D.   On: 05/18/2014 12:48    Medications: I have reviewed the patient's current medications.  Assessment/Plan:  1. Functional deficits secondary to Deconditioning after CABG and multiple medical  2. DVT Prophylaxis/Anticoagulation: Pharmaceutical: Lovenox  3. Pain Management: prn oxycodone is effective.  4. Mood: Has a  supportive wife who is off for the summer. LCSW to follow for evaluation and support.  5. Neuropsych: This patient is capable of making decisions on his own behalf.  6. Fluid overload: Low salt diet. Check daily weights as well as close monitoring for signs of overload. Off lasix at this time. 7. DM type 2: Glyburide on hold due to renal insufficiency---will change to amaryl and wean off levemir. Monitor BS with ac/hs checks and use SSI for elevated BS. Titrate oral medication as indicated.  8. PVD with multiple left foot ulcers: Continue DARCO shoe for pressure relief.  9. ABLA: H/H with slow  improvement. Add iron supplement  10. Acute on CKD: Will continue to monitor renal status for now. Push po fluids. Avoid nephrotoxic medications  11. Urinary retention: discontinue foley voiding trial Continue flomax. UA neg  12. CAD: Continue coreg, lipitor and ASA.  13. Constipation: improved , monitor - see Meds. Better 14. Orthostatic hypotension, adjusted med      Length of stay, days: [place] , MD 05/20/2014, 9:01 AM

## 2014-05-20 NOTE — Progress Notes (Signed)
Pt voided 200cc. Bladder scanned for 795cc. Per orders pt I and O cathed for 575. Pt tolerated well with clear yellow urine return. Cont to monitor.

## 2014-05-20 NOTE — Progress Notes (Signed)
Pt voided 450 cc at 1330. PVR was 325cc. Pt given one more hour to see if he could double void, but unable to do so. I&O cath at 1445 for 400cc. Continue to monitor. Hortencia Conradi RN

## 2014-05-21 ENCOUNTER — Inpatient Hospital Stay (HOSPITAL_COMMUNITY): Payer: BC Managed Care – PPO

## 2014-05-21 ENCOUNTER — Encounter (HOSPITAL_COMMUNITY): Payer: BC Managed Care – PPO

## 2014-05-21 DIAGNOSIS — N189 Chronic kidney disease, unspecified: Secondary | ICD-10-CM | POA: Diagnosis present

## 2014-05-21 DIAGNOSIS — R5381 Other malaise: Secondary | ICD-10-CM

## 2014-05-21 DIAGNOSIS — B9620 Unspecified Escherichia coli [E. coli] as the cause of diseases classified elsewhere: Secondary | ICD-10-CM | POA: Diagnosis not present

## 2014-05-21 DIAGNOSIS — R338 Other retention of urine: Secondary | ICD-10-CM | POA: Diagnosis present

## 2014-05-21 DIAGNOSIS — N390 Urinary tract infection, site not specified: Secondary | ICD-10-CM

## 2014-05-21 DIAGNOSIS — M86672 Other chronic osteomyelitis, left ankle and foot: Secondary | ICD-10-CM | POA: Diagnosis present

## 2014-05-21 DIAGNOSIS — L97529 Non-pressure chronic ulcer of other part of left foot with unspecified severity: Secondary | ICD-10-CM

## 2014-05-21 DIAGNOSIS — E11621 Type 2 diabetes mellitus with foot ulcer: Secondary | ICD-10-CM | POA: Diagnosis present

## 2014-05-21 LAB — BASIC METABOLIC PANEL
Anion gap: 11 (ref 5–15)
BUN: 23 mg/dL (ref 6–23)
CALCIUM: 9.2 mg/dL (ref 8.4–10.5)
CO2: 27 mEq/L (ref 19–32)
Chloride: 101 mEq/L (ref 96–112)
Creatinine, Ser: 1.45 mg/dL — ABNORMAL HIGH (ref 0.50–1.35)
GFR calc non Af Amer: 54 mL/min — ABNORMAL LOW (ref >90)
GFR, EST AFRICAN AMERICAN: 63 mL/min — AB (ref >90)
GLUCOSE: 98 mg/dL (ref 70–99)
POTASSIUM: 5.2 meq/L (ref 3.7–5.3)
SODIUM: 139 meq/L (ref 137–147)

## 2014-05-21 LAB — CBC WITH DIFFERENTIAL/PLATELET
BASOS ABS: 0 10*3/uL (ref 0.0–0.1)
BASOS PCT: 1 % (ref 0–1)
EOS ABS: 0.2 10*3/uL (ref 0.0–0.7)
EOS PCT: 2 % (ref 0–5)
HCT: 29.7 % — ABNORMAL LOW (ref 39.0–52.0)
Hemoglobin: 9.6 g/dL — ABNORMAL LOW (ref 13.0–17.0)
Lymphocytes Relative: 35 % (ref 12–46)
Lymphs Abs: 2.2 10*3/uL (ref 0.7–4.0)
MCH: 28.7 pg (ref 26.0–34.0)
MCHC: 32.3 g/dL (ref 30.0–36.0)
MCV: 88.7 fL (ref 78.0–100.0)
Monocytes Absolute: 0.7 10*3/uL (ref 0.1–1.0)
Monocytes Relative: 11 % (ref 3–12)
NEUTROS PCT: 51 % (ref 43–77)
Neutro Abs: 3.3 10*3/uL (ref 1.7–7.7)
Platelets: 405 10*3/uL — ABNORMAL HIGH (ref 150–400)
RBC: 3.35 MIL/uL — AB (ref 4.22–5.81)
RDW: 12.6 % (ref 11.5–15.5)
WBC: 6.4 10*3/uL (ref 4.0–10.5)

## 2014-05-21 LAB — GLUCOSE, CAPILLARY
GLUCOSE-CAPILLARY: 93 mg/dL (ref 70–99)
GLUCOSE-CAPILLARY: 173 mg/dL — AB (ref 70–99)
Glucose-Capillary: 117 mg/dL — ABNORMAL HIGH (ref 70–99)
Glucose-Capillary: 82 mg/dL (ref 70–99)

## 2014-05-21 MED ORDER — CIPROFLOXACIN HCL 500 MG PO TABS
500.0000 mg | ORAL_TABLET | Freq: Two times a day (BID) | ORAL | Status: DC
  Administered 2014-05-21 – 2014-05-25 (×9): 500 mg via ORAL
  Filled 2014-05-21 (×11): qty 1

## 2014-05-21 NOTE — Progress Notes (Signed)
Patient declined to have right leg incisions painted with betadine. Patient stated" they haven't been doing that" incisions scabbed, slightly red. [place]on[place], [name] [name]

## 2014-05-21 NOTE — Progress Notes (Addendum)
Dressing changed per orders.  Rowe Pavy, RN

## 2014-05-21 NOTE — Progress Notes (Signed)
Occupational Therapy Session Note  Patient Details  Name: [name] [name] MRN: [id number] Date of Birth: 02-26-1961  Today's Date: 05/21/2014 Time: 0930-1030 Time Calculation (min): 60 min  Short Term Goals: Week 1:  OT Short Term Goal 1 (Week 1): Patient will perform sit<>stand with supervision in order to increase independence with LB ADLs OT Short Term Goal 2 (Week 1): Patient will perform toilet transfers with supervision OT Short Term Goal 3 (Week 1): Patient will perform UB/LB dressing with supervision OT Short Term Goal 4 (Week 1): Patient will adere to sternal precautions at least 75% of the time independently  Skilled Therapeutic Interventions/Progress Updates:  ADL @ shower level with focus on adherence to sternal precautions, re-ed on use of AE, sit-stands.   Patient received in his recliner awaiting therapist.  Patient agreed to shower level ADL with use of protective covering for left foot.   Patient required mod assist to stand from low seat due to LE weakness and he admits to disregarding precautions, stating he uses his arms "slightly" to assist to push up to stand otherwise.   Patient rejected use of new DRACO shoe provided and substitutes his DRACO shoe from home due to poor balance and discomfort when using new shoe.   Patient ambulated to tub bench and performed shower without use of AE.    Patient also dressed without AE and stated he will likely not need AE (reacher, sock aid, etc.) at discharge.   Patient completed grooming at sink and then performed 3 sit<>stands with mod assist to improve leg strength and minimize use of arms while attempting to stand.     Therapy Documentation Precautions:  Precautions Precautions: Sternal;Fall Precaution Comments: sternal precautions Required Braces or Orthoses: Other Brace/Splint Other Brace/Splint: flat darco shoe L foot Restrictions Weight Bearing Restrictions: No Other Position/Activity Restrictions: offload lt metatarsals as  able  Pain: Pain Assessment Pain Assessment: No/denies pain  See FIM for current functional status  Therapy/Group: Individual Therapy  [place] 05/21/2014, 11:21 AM

## 2014-05-21 NOTE — Progress Notes (Signed)
Physical Therapy Session Note  Patient Details  Name: [name] [name] MRN: [id number] Date of Birth: 1960-12-02  Today's Date: 05/21/2014 Time: 0828-0925, [id number], & [phone] Time Calculation (min):  57 min, 42 min, & 30 min  Short Term Goals: Week 1:  PT Short Term Goal 1 (Week 1): = LTG of mod I overall secondary to ELOS  Skilled Therapeutic Interventions/Progress Updates:  Session 1 (57 min): Pt. Supine in bed with HOB elevated. No c/o pain at present, with pt. Agreeable to therapy. Required assistance for [facility] due to placement in the room. Pt. Room<>gym mod I with RW. Gait: 80' x 2 with RW at (S) due to lightheadedness. Pt. Had no LOBs, only requiring short standing rest/recovery breaks. Therapeutic Exercise (TE): mini-squats 3x10 with min A for safety and RW positioned anterior to pt. Pt. Required increasing height bed/plinth with fatigue to complete TE sets/reps. Pt. Required frequent rest/recovery breaks and constant verbal cues  for breathing without valsalva occuring during TE. Side-lying hip abduction against gravity 2x10 ea. Therapeutic Activities (TA): Transfers: sit<>stand x8 Mod I with RW; Car x4 with RW and Mod I with (S) for safety. Pt. Given instruction, demonstration, and able to teach-back proper techniques for car entry and exit. Toilet: x2 sit<>stands, adjusting for position with (s) for safety. Pt. Displays decreased awareness of sternal precautions with urgency. Pt. Seated in room recliner with all needs within reach. Pt. Has no C/O pain.  Session 2 (42 min): Pt. Supine in bed with HOB elevated, states he is ready for therapy. Pt. Room <> gym mod I with RW. Therapeutic Activities: Gait: Pt. Ambulated >150' x 2 with RW at (S) with occasional standing rest break with verbal cues to increase step width. Pt. Requires (s) for endurance tasks due to pt. Decreased ability to estimate energy conservation needs. Pt. Pushes himself and gait will become ataxic-like with fatigue.  Pt. Requires occasional verbal cue to deep breathe and pt. States it helps with lightheadedness. Pt. Has no C/O pain.  Session 3 (30 min): Pt. Seated in w/c, agreeable to therapy but stating he feels tired, stating "had a large meal." Pt. States he's feeling a little more lightheaded than usual. Gait: Pt. Ambulated 68' with RW at (s) due to lightheadedness. Pt. Experienced one LOB with symptoms that quickly dissipated with seated rest break. Pt. Ambulated 180' with RW at (s) with no LOBs. Pt. Propelled w/c with BUE/BLEs back to room 100' mod I to conclude gait session. Therapeutic Exercise: Seated in W/C BLE exercises for improved motor coordination: timed 3 minutes each, heel rolling theraball clockwise/counterclockwise, Hip flexion, LAQ 3x10, Hip add with pillow/theraball 2x10 with 3 second holds for increased BLE strengthening. Pt. Seated on toilet with RN tech notified. Pt. Alert/aware and will use call bell/string when done. Pt. Has no c/o pain.  Therapy Documentation Precautions:  Precautions Precautions: Sternal;Fall Precaution Comments: sternal precautions Required Braces or Orthoses: Other Brace/Splint Other Brace/Splint: flat darco shoe L foot Restrictions Weight Bearing Restrictions: No Other Position/Activity Restrictions: offload lt metatarsals as able  Vital Signs:  SpO2: 98 % O2 Device: None (Room air)  Locomotion : Ambulation Ambulation/Gait Assistance: 5: Supervision Wheelchair Mobility Distance: 120   See FIM for current functional status  Therapy/Group: Individual Therapy  Juluis Mire 05/21/2014, 3:48 PM

## 2014-05-21 NOTE — Progress Notes (Signed)
53 y.o. 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 Hospital course at that time complicated by AKI due to antibiotics as well as SOB post op and was found to have EF 15% and defibrillator placed prior to d/c. He had a stress cardiolyte in March 2015 showing a fixed inferior and reversible lateral defect with an EF of 40% and had improvement in EF and ICD removed. He has had intermittent chest discomfort as well as dizziness and falls question due to medication SE. Cardiac cath at [facility] revealed severe multivessel disease and he was admitted on 05/07/14 for CABG by Dr. Cyndia Bent.  Subjective/Complaints: Emptying bladder. Denies pain, sob, cough,   Objective: Vital Signs: Blood pressure 133/72, pulse 76, temperature 97.9 F (36.6 C), temperature source Oral, resp. rate 18, height 6' 0.84" (1.85 m), weight 100.517 kg (221 lb 9.6 oz), SpO2 97.00%. No results found. Results for orders placed during the hospital encounter of 05/15/14 (from the past 72 hour(s))  CULTURE, BLOOD (ROUTINE X 2)     Status: None   Collection Time    05/18/14  8:15 AM      Result Value Ref Range   Specimen Description BLOOD LEFT HAND     Special Requests BOTTLES DRAWN AEROBIC AND ANAEROBIC 10CC     Culture  Setup Time       Value: 05/18/2014 14:58     Performed at [facility]   Culture       Value:        BLOOD CULTURE RECEIVED NO GROWTH TO DATE CULTURE WILL BE HELD FOR 5 DAYS BEFORE ISSUING A FINAL NEGATIVE REPORT     Performed at [facility]   Report Status PENDING    CULTURE, BLOOD (ROUTINE X 2)     Status: None   Collection Time    05/18/14  8:24 AM      Result Value Ref Range   Specimen Description BLOOD LEFT ANTECUBITAL     Special Requests BOTTLES DRAWN AEROBIC ONLY 3CC     Culture  Setup Time        Value: 05/18/2014 14:58     Performed at [facility]   Culture       Value:        BLOOD CULTURE RECEIVED NO GROWTH TO DATE CULTURE WILL BE HELD FOR 5 DAYS BEFORE ISSUING A FINAL NEGATIVE REPORT     Performed at [facility]   Report Status PENDING    WOUND CULTURE     Status: None   Collection Time    05/18/14  8:27 AM      Result Value Ref Range   Specimen Description WOUND LEFT FOOT     Special Requests Normal     Gram Stain       Value: RARE WBC PRESENT,BOTH PMN AND MONONUCLEAR     NO SQUAMOUS EPITHELIAL CELLS SEEN     FEW GRAM POSITIVE COCCI IN PAIRS     FEW GRAM POSITIVE RODS     RARE GRAM NEGATIVE RODS   Culture       Value: MULTIPLE ORGANISMS PRESENT, NONE PREDOMINANT NO STAPHYLOCOCCUS AUREUS ISOLATED NO GROUP A STREP (S.PYOGENES) ISOLATED     Performed at [facility]   Report Status 05/20/2014 FINAL    CBC WITH DIFFERENTIAL  Status: Abnormal   Collection Time    05/18/14 11:20 AM      Result Value Ref Range   WBC 16.8 (*) 4.0 - 10.5 K/uL   RBC 3.20 (*) 4.22 - 5.81 MIL/uL   Hemoglobin 9.4 (*) 13.0 - 17.0 g/dL   HCT 28.9 (*) 39.0 - 52.0 %   MCV 90.3  78.0 - 100.0 fL   MCH 29.4  26.0 - 34.0 pg   MCHC 32.5  30.0 - 36.0 g/dL   RDW 12.9  11.5 - 15.5 %   Platelets 344  150 - 400 K/uL   Neutrophils Relative % 84 (*) 43 - 77 %   Neutro Abs 14.0 (*) 1.7 - 7.7 K/uL   Lymphocytes Relative 8 (*) 12 - 46 %   Lymphs Abs 1.3  0.7 - 4.0 K/uL   Monocytes Relative 8  3 - 12 %   Monocytes Absolute 1.3 (*) 0.1 - 1.0 K/uL   Eosinophils Relative 0  0 - 5 %   Eosinophils Absolute 0.1  0.0 - 0.7 K/uL   Basophils Relative 0  0 - 1 %   Basophils Absolute 0.0  0.0 - 0.1 K/uL  GLUCOSE, CAPILLARY     Status: Abnormal   Collection Time    05/18/14 11:28 AM      Result Value Ref Range   Glucose-Capillary 150 (*) 70 - 99 mg/dL   Comment 1 Notify RN    GLUCOSE, CAPILLARY     Status: None   Collection Time    05/18/14  4:48 PM      Result Value Ref Range    Glucose-Capillary 84  70 - 99 mg/dL  GLUCOSE, CAPILLARY     Status: None   Collection Time    05/18/14  9:35 PM      Result Value Ref Range   Glucose-Capillary 76  70 - 99 mg/dL  GLUCOSE, CAPILLARY     Status: Abnormal   Collection Time    05/19/14  7:29 AM      Result Value Ref Range   Glucose-Capillary 103 (*) 70 - 99 mg/dL  GLUCOSE, CAPILLARY     Status: Abnormal   Collection Time    05/19/14 11:34 AM      Result Value Ref Range   Glucose-Capillary 153 (*) 70 - 99 mg/dL  GLUCOSE, CAPILLARY     Status: None   Collection Time    05/19/14  4:22 PM      Result Value Ref Range   Glucose-Capillary 88  70 - 99 mg/dL  GLUCOSE, CAPILLARY     Status: None   Collection Time    05/19/14  8:58 PM      Result Value Ref Range   Glucose-Capillary 92  70 - 99 mg/dL  GLUCOSE, CAPILLARY     Status: Abnormal   Collection Time    05/20/14  7:18 AM      Result Value Ref Range   Glucose-Capillary 127 (*) 70 - 99 mg/dL  GLUCOSE, CAPILLARY     Status: Abnormal   Collection Time    05/20/14 11:08 AM      Result Value Ref Range   Glucose-Capillary 151 (*) 70 - 99 mg/dL  GLUCOSE, CAPILLARY     Status: Abnormal   Collection Time    05/20/14  4:41 PM      Result Value Ref Range   Glucose-Capillary 113 (*) 70 - 99 mg/dL  GLUCOSE, CAPILLARY     Status: Abnormal  Collection Time    05/20/14  8:04 PM      Result Value Ref Range   Glucose-Capillary 123 (*) 70 - 99 mg/dL  GLUCOSE, CAPILLARY     Status: None   Collection Time    05/21/14  7:03 AM      Result Value Ref Range   Glucose-Capillary 93  70 - 99 mg/dL  BASIC METABOLIC PANEL     Status: Abnormal   Collection Time    05/21/14  7:09 AM      Result Value Ref Range   Sodium 139  137 - 147 mEq/L   Potassium 5.2  3.7 - 5.3 mEq/L   Chloride 101  96 - 112 mEq/L   CO2 27  19 - 32 mEq/L   Glucose, Bld 98  70 - 99 mg/dL   BUN 23  6 - 23 mg/dL   Creatinine, Ser 1.45 (*) 0.50 - 1.35 mg/dL   Calcium 9.2  8.4 - 10.5 mg/dL   GFR calc non Af  Amer 54 (*) >90 mL/min   GFR calc Af Amer 63 (*) >90 mL/min   Comment: (NOTE)     The eGFR has been calculated using the CKD EPI equation.     This calculation has not been validated in all clinical situations.     eGFR's persistently <90 mL/min signify possible Chronic Kidney     Disease.   Anion gap 11  5 - 15  CBC WITH DIFFERENTIAL     Status: Abnormal   Collection Time    05/21/14  7:09 AM      Result Value Ref Range   WBC 6.4  4.0 - 10.5 K/uL   RBC 3.35 (*) 4.22 - 5.81 MIL/uL   Hemoglobin 9.6 (*) 13.0 - 17.0 g/dL   HCT 29.7 (*) 39.0 - 52.0 %   MCV 88.7  78.0 - 100.0 fL   MCH 28.7  26.0 - 34.0 pg   MCHC 32.3  30.0 - 36.0 g/dL   RDW 12.6  11.5 - 15.5 %   Platelets 405 (*) 150 - 400 K/uL   Neutrophils Relative % 51  43 - 77 %   Neutro Abs 3.3  1.7 - 7.7 K/uL   Lymphocytes Relative 35  12 - 46 %   Lymphs Abs 2.2  0.7 - 4.0 K/uL   Monocytes Relative 11  3 - 12 %   Monocytes Absolute 0.7  0.1 - 1.0 K/uL   Eosinophils Relative 2  0 - 5 %   Eosinophils Absolute 0.2  0.0 - 0.7 K/uL   Basophils Relative 1  0 - 1 %   Basophils Absolute 0.0  0.0 - 0.1 K/uL      Constitutional: He is oriented to person, place, and time. He appears well-developed and well-nourished.  HENT:  Head: Normocephalic and atraumatic.  Eyes: Conjunctivae are normal. Pupils are equal, round, and reactive to light.  Neck: Normal range of motion. Neck supple.  Cardiovascular: Normal rate and regular rhythm.  Murmur heard.  Respiratory: Effort normal. No accessory muscle usage. No respiratory distress. He has decreased breath sounds in the left lower field. He has no wheezes.  GI: Soft. Bowel sounds are normal. He exhibits no distension. There is no tenderness.  Musculoskeletal:  Left foot with well healed incision  S/p 3rd and 4th toe amputation.  Neurological: He is alert and oriented to person, place, and time.  Speech soft but clear. Follows commands without difficulty. BUE's 4/5 prox to distal.  LLE 2/5  HF, KE, foot, RLE 3/5 HF, KE and foot. Decreased PP and LT in both feet and fingers.  Skin: Skin is warm and dry.  RLE graft incisions with skin glue.    Assessment/Plan: 1. Functional deficits secondary to deconditioning after CABG which require 3+ hours per day of interdisciplinary therapy in a comprehensive inpatient rehab setting. Physiatrist is providing close team supervision and 24 hour management of active medical problems listed below. Physiatrist and rehab team continue to assess barriers to discharge/monitor patient progress toward functional and medical goals. FIM: FIM - Bathing Bathing Steps Patient Completed: Chest;Right Arm;Left Arm;Abdomen;Front perineal area;Buttocks;Right upper leg;Left upper leg;Right lower leg (including foot) Bathing: 5: Supervision: Safety issues/verbal cues (seated in shower)  FIM - Upper Body Dressing/Undressing Upper body dressing/undressing steps patient completed: Thread/unthread right sleeve of pullover shirt/dresss;Put head through opening of pull over shirt/dress;Thread/unthread left sleeve of pullover shirt/dress;Pull shirt over trunk Upper body dressing/undressing: 5: Set-up assist to: Obtain clothing/put away FIM - Lower Body Dressing/Undressing Lower body dressing/undressing steps patient completed: Thread/unthread right pants leg;Thread/unthread left pants leg;Pull pants up/down;Don/Doff right shoe;Don/Doff left shoe;Fasten/unfasten right shoe;Fasten/unfasten left shoe;Thread/unthread right underwear leg;Thread/unthread left underwear leg;Pull underwear up/down;Don/Doff right sock;Don/Doff left sock Lower body dressing/undressing: 4: Steadying Assist (in standing using RW)  FIM - Toileting Toileting steps completed by patient: Performs perineal hygiene;Adjust clothing after toileting;Adjust clothing prior to toileting Toileting Assistive Devices: Grab bar or rail for support Toileting: 4: Steadying assist  FIM - Sport and exercise psychologist Devices: Grab bars Toilet Transfers: 4-To toilet/BSC: Min A (steadying Pt. > 75%);4-From toilet/BSC: Min A (steadying Pt. > 75%)  FIM - Bed/Chair Transfer Bed/Chair Transfer Assistive Devices: Copy: 4: Chair or W/C > Bed: Min A (steadying Pt. > 75%)  FIM - Locomotion: Wheelchair Distance: 100 Locomotion: Wheelchair: 5: Travels 150 ft or more: maneuvers on rugs and over door sills with supervision, cueing or coaxing FIM - Locomotion: Ambulation Locomotion: Ambulation Assistive Devices: Administrator Ambulation/Gait Assistance: 4: Min assist Locomotion: Ambulation: 0: Activity did not occur  Comprehension Comprehension Mode: Auditory Comprehension: 6-Follows complex conversation/direction: With extra time/assistive device  Expression Expression Mode: Verbal Expression: 6-Expresses complex ideas: With extra time/assistive device  Social Interaction Social Interaction: 6-Interacts appropriately with others with medication or extra time (anti-anxiety, antidepressant).  Problem Solving Problem Solving: 6-Solves complex problems: With extra time  Memory Memory: 6-Assistive device: No helper  Medical Problem List and Plan:  1. Functional deficits secondary to Deconditioning after CABG and multiple medical  2. DVT Prophylaxis/Anticoagulation: Pharmaceutical: Lovenox  3. Pain Management: prn oxycodone is effective.  4. Mood: Has a supportive wife who is off for the summer. LCSW to follow for evaluation and support.  5. Neuropsych: This patient is capable of making decisions on his own behalf.  6. Fluid overload: Low salt diet.   Off lasix at this time. Appears balanced to negative 7. DM type 2: amaryl with good control 8. PVD with multiple left foot ulcers: Continue DARCO shoe for pressure relief.  9. ABLA: H/H with slow improvement.  Add iron supplement  10. Acute on CKD: Will continue to monitor renal status for now. Push po fluids. Avoid  nephrotoxic medications  11. Urinary retention: discontinue foley voiding trial  Continue flomax. UA neg  12. CAD: Continue coreg, lipitor and ASA.  13. Constipation: improved , monitor  14.orthostatic hypotension, adjust med  LOS (Days) 6 A FACE TO FACE EVALUATION WAS PERFORMED  Estephany Perot T 05/21/2014, 8:14 AM

## 2014-05-21 NOTE — Progress Notes (Signed)
Recreational Therapy Assessment and Plan  Patient Details  Name: [name] [name] MRN: [id number] Date of Birth: [birth year] Today's Date: 05/21/2014  Rehab Potential: Good ELOS: 10 days   Assessment Clinical Impression: Problem List:  Patient Active Problem List    Diagnosis  Date Noted   .  Physical deconditioning  05/15/2014   .  S/P CABG x 4  05/13/2014   .  Type II or unspecified type diabetes mellitus without mention of complication, uncontrolled  05/08/2014   .  CAD (coronary artery disease)     Past Medical History:  Past Medical History   Diagnosis  Date   .  CAD (coronary artery disease)    .  Cardiomyopathy    .  Chest pain    .  Congestive heart failure    .  Diabetes    .  Dyspnea    .  Hypertension    .  Peripheral vascular disease    .  Diabetic foot ulcers      left foot 2nd digit ant 5 th digit 05/03/14   .  Hypercholesterolemia    .  Myocardial infarction    .  Chronic kidney disease    .  Neuropathy in diabetes     Past Surgical History:  Past Surgical History   Procedure  Laterality  Date   .  Left foot osteomyelitis and wound after surgery     .  Tonsilectomy/adenoidectomy with myringotomy     .  Cataract extraction     .  Toe amputation       Left 3 and 4 toes   .  Cardiac catheterization       03/2014   .  Coronary artery bypass graft  N/A  05/07/2014     Procedure: CORONARY ARTERY BYPASS GRAFTING (CABG); Surgeon: Gaye Pollack, MD; Location: [place]; Service: Open Heart Surgery; Laterality: N/A; Times 4 using left internal mammary artery and endoscopically harvested right saphenous vein   .  Intraoperative transesophageal echocardiogram  N/A  05/07/2014     Procedure: INTRAOPERATIVE TRANSESOPHAGEAL ECHOCARDIOGRAM; Surgeon: Gaye Pollack, MD; Location: [facility] OR; Service: Open Heart Surgery; Laterality: N/A;    Clinical Impression: [name] [name] is a 53 y.o. right hand male with history of diabetes mellitus with peripheral neuropathy, PVD, hypertension who  underwent surgery on his left foot in September of 2014 for nonhealing wound due to MRSA osteomyelitis and required amputation of multiple toes. Hospital course at that time complicated by AKI due to antibiotics as well as SOB post op and was found to have EF 15% and defibrillator placed prior to d/c. He had a stress cardiolyte in March 2015 showing a fixed inferior and reversible lateral defect with an EF of 40% and had improvement in EF and ICD removed. He has had intermittent chest discomfort as well as dizziness and falls question due to medication SE. Cardiac [facility] at [facility] revealed severe multivessel disease and he was admitted on 05/07/14 for CABG by Dr. Cyndia Bent. Post op pulmonary edema treated with diuresis and ABLA being monitored. DARCO shoe ordered for left foot and ailver Hydrofiber for hyperkeratotic periwound with minimal drainage. Foley placed due to urinary retention and voiding trial initiated on 05/14/14 but foley replaced due to inabiltiyt to urinate. CXR with small bilateral pleural effusions and small PTX--IS encouraged. Therapy ongoing and patient noted to be deconditioned due to multiple medical issues as well as limited due to sternal precautions. CIR recommended by rehab team and patient  admitted today. Patient transferred to CIR on 05/15/2014.  Pt presents with decreased activity tolerance, decreased functional mobility, decreased balance, & difficulty maintaining precautions Limiting pt's independence with leisure/community pursuits.   Leisure History/Participation Premorbid leisure interest/current participation: Sports - Golf;Nature - Fishing;Community - Doctor, hospital - Building control surveyor - Travel (Comment) Other Leisure Interests: Television Leisure Participation Style: Alone;With Family/Friends Awareness of Community Resources: Good-identify 3 post discharge leisure resources Psychosocial / Spiritual Social interaction - Mood/Behavior: Cooperative Production manager Appropriate for Education?: Yes Recreational Therapy Orientation Orientation -Reviewed with patient: Available activity resources Strengths/Weaknesses Patient Strengths/Abilities: Willingness to participate;Active premorbidly Patient weaknesses: Physical limitations TR Patient demonstrates impairments in the following area(s): Edema;Endurance;Motor;Pain;Safety TR Additional Impairment(s): None  Plan Rec Therapy Plan Is patient appropriate for Therapeutic Recreation?: Yes Rehab Potential: Good Treatment times per week: Min 1 time per week >20 minutes Estimated Length of Stay: 10 days TR Treatment/Interventions: Adaptive equipment instruction;1:1 session;Balance/vestibular training;Functional mobility training;Community reintegration;Patient/family education;Therapeutic activities;Recreation/leisure participation;Therapeutic exercise;UE/LE Coordination activities;Wheelchair propulsion/positioning  Recommendations for other services: None  Discharge Criteria: Patient will be discharged from TR if patient refuses treatment 3 consecutive times without medical reason.  If treatment goals not met, if there is a change in medical status, if patient makes no progress towards goals or if patient is discharged from hospital.  The above assessment, treatment plan, treatment alternatives and goals were discussed and mutually agreed upon: by patient  [place] 05/21/2014, 2:47 PM

## 2014-05-22 ENCOUNTER — Encounter (HOSPITAL_COMMUNITY): Payer: BC Managed Care – PPO | Admitting: Occupational Therapy

## 2014-05-22 ENCOUNTER — Inpatient Hospital Stay (HOSPITAL_COMMUNITY): Payer: BC Managed Care – PPO | Admitting: *Deleted

## 2014-05-22 ENCOUNTER — Inpatient Hospital Stay (HOSPITAL_COMMUNITY): Payer: BC Managed Care – PPO

## 2014-05-22 ENCOUNTER — Inpatient Hospital Stay (HOSPITAL_COMMUNITY): Payer: BC Managed Care – PPO | Admitting: Occupational Therapy

## 2014-05-22 LAB — GLUCOSE, CAPILLARY
GLUCOSE-CAPILLARY: 133 mg/dL — AB (ref 70–99)
Glucose-Capillary: 109 mg/dL — ABNORMAL HIGH (ref 70–99)
Glucose-Capillary: 120 mg/dL — ABNORMAL HIGH (ref 70–99)
Glucose-Capillary: 95 mg/dL (ref 70–99)

## 2014-05-22 NOTE — Progress Notes (Signed)
Recreational Therapy Session Note  Patient Details  Name: [name] [name] MRN: [id number] Date of Birth: May 22, 1961 Today's Date: 05/22/2014  Pain: no c/o Skilled Therapeutic Interventions/Progress Updates: Session focused on activity tolerance, w/c mobility, standing balance.  Pt propelled w/c using BUE's with supervision.  Pt stood without UE support to putt golf balls with contact guard assist.    Therapy/Group: Individual Therapy  Labrenda Lasky 05/22/2014, 4:16 PM

## 2014-05-22 NOTE — Progress Notes (Signed)
Occupational Therapy Session Note  Patient Details  Name: [name] [name] MRN: [id number] Date of Birth: Jul 11, 1961  Today's Date: 05/22/2014 Time: 1135-1200 Time Calculation (min): 25 min  Short Term Goals: Week 1:  OT Short Term Goal 1 (Week 1): Patient will perform sit<>stand with supervision in order to increase independence with LB ADLs OT Short Term Goal 2 (Week 1): Patient will perform toilet transfers with supervision OT Short Term Goal 3 (Week 1): Patient will perform UB/LB dressing with supervision OT Short Term Goal 4 (Week 1): Patient will adere to sternal precautions at least 75% of the time independently  Skilled Therapeutic Interventions/Progress Updates:    Pt worked initially on sit to stand and squating to stand transitions during the session with pt maintaining sternal precautions and keeping his hands on his knees.  He was able to perform sit to stand transitions from the mat with min assist.  Increased posterior lean noted when transitioning sit to stand as well.  Performed 2 intervals of 3-4 squat to stands with min assist and then transitioned to functional task of picking up rings from the mat and from rolling stool placed in front of him.  Pt with one LOB posteriorly when reaching down to the mat on his right side.  Pt attempted to retrieve one ring from the floor as well but needed mod assist for balance and return to standing position.  Educated pt on use of a reacher for retrieving items from the floor as pt is currently a high fall risk and has history of recent falls at home.  He was able to use the reacher to retrieve rings that were tossed on the floor with supervision and RW for support.  To conclude session had him ambulate to the room with the RW.   Therapy Documentation Precautions:  Precautions Precautions: Sternal;Fall Precaution Comments: sternal precautions Required Braces or Orthoses: Other Brace/Splint Other Brace/Splint: flat darco shoe L  foot Restrictions Weight Bearing Restrictions: No Other Position/Activity Restrictions: offload lt metatarsals as able  Pain: Pain Assessment Pain Assessment: No/denies pain ADL: See FIM for current functional status  Therapy/Group: Individual Therapy  Sharmeka Palmisano OTR/L 05/22/2014, 12:58 PM

## 2014-05-22 NOTE — Progress Notes (Signed)
Patient voided 225 ml at 0628. PVR: 729. Spoke to [employer], [place]. Per order, patient straight cath. Output: 675. Con Arganbright A. Lavada Langsam, RN...Marland Kitchen

## 2014-05-22 NOTE — Progress Notes (Signed)
53 y.o. 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 Hospital course at that time complicated by AKI due to antibiotics as well as SOB post op and was found to have EF 15% and defibrillator placed prior to d/c. He had a stress cardiolyte in March 2015 showing a fixed inferior and reversible lateral defect with an EF of 40% and had improvement in EF and ICD removed. He has had intermittent chest discomfort as well as dizziness and falls question due to medication SE. Cardiac cath at [facility] revealed severe multivessel disease and he was admitted on 05/07/14 for CABG by Dr. Cyndia Bent.  Subjective/Complaints: orthostatic still. Denies pain. No fever, chills, sob A  review of systems has been performed and if not noted above is otherwise negative.   Objective: Vital Signs: Blood pressure 126/75, pulse 76, temperature 98.7 F (37.1 C), temperature source Oral, resp. rate 18, height 6' 0.84" (1.85 m), weight 100.1 kg (220 lb 10.9 oz), SpO2 97.00%. No results found. Results for orders placed during the hospital encounter of 05/15/14 (from the past 72 hour(s))  GLUCOSE, CAPILLARY     Status: Abnormal   Collection Time    05/19/14 11:34 AM      Result Value Ref Range   Glucose-Capillary 153 (*) 70 - 99 mg/dL  GLUCOSE, CAPILLARY     Status: None   Collection Time    05/19/14  4:22 PM      Result Value Ref Range   Glucose-Capillary 88  70 - 99 mg/dL  GLUCOSE, CAPILLARY     Status: None   Collection Time    05/19/14  8:58 PM      Result Value Ref Range   Glucose-Capillary 92  70 - 99 mg/dL  GLUCOSE, CAPILLARY     Status: Abnormal   Collection Time    05/20/14  7:18 AM      Result Value Ref Range   Glucose-Capillary 127 (*) 70 - 99 mg/dL  GLUCOSE, CAPILLARY     Status: Abnormal   Collection Time   05/20/14 11:08 AM      Result Value Ref Range   Glucose-Capillary 151 (*) 70 - 99 mg/dL  GLUCOSE, CAPILLARY     Status: Abnormal   Collection Time    05/20/14  4:41 PM      Result Value Ref Range   Glucose-Capillary 113 (*) 70 - 99 mg/dL  GLUCOSE, CAPILLARY     Status: Abnormal   Collection Time    05/20/14  8:04 PM      Result Value Ref Range   Glucose-Capillary 123 (*) 70 - 99 mg/dL  GLUCOSE, CAPILLARY     Status: None   Collection Time    05/21/14  7:03 AM      Result Value Ref Range   Glucose-Capillary 93  70 - 99 mg/dL  BASIC METABOLIC PANEL     Status: Abnormal   Collection Time    05/21/14  7:09 AM      Result Value Ref Range   Sodium 139  137 - 147 mEq/L   Potassium 5.2  3.7 - 5.3 mEq/L   Chloride 101  96 - 112 mEq/L   CO2 27  19 - 32 mEq/L   Glucose, Bld 98  70 - 99 mg/dL   BUN 23  6 - 23 mg/dL  Creatinine, Ser 1.45 (*) 0.50 - 1.35 mg/dL   Calcium 9.2  8.4 - 10.5 mg/dL   GFR calc non Af Amer 54 (*) >90 mL/min   GFR calc Af Amer 63 (*) >90 mL/min   Comment: (NOTE)     The eGFR has been calculated using the CKD EPI equation.     This calculation has not been validated in all clinical situations.     eGFR's persistently <90 mL/min signify possible Chronic Kidney     Disease.   Anion gap 11  5 - 15  CBC WITH DIFFERENTIAL     Status: Abnormal   Collection Time    05/21/14  7:09 AM      Result Value Ref Range   WBC 6.4  4.0 - 10.5 K/uL   RBC 3.35 (*) 4.22 - 5.81 MIL/uL   Hemoglobin 9.6 (*) 13.0 - 17.0 g/dL   HCT 29.7 (*) 39.0 - 52.0 %   MCV 88.7  78.0 - 100.0 fL   MCH 28.7  26.0 - 34.0 pg   MCHC 32.3  30.0 - 36.0 g/dL   RDW 12.6  11.5 - 15.5 %   Platelets 405 (*) 150 - 400 K/uL   Neutrophils Relative % 51  43 - 77 %   Neutro Abs 3.3  1.7 - 7.7 K/uL   Lymphocytes Relative 35  12 - 46 %   Lymphs Abs 2.2  0.7 - 4.0 K/uL   Monocytes Relative 11  3 - 12 %   Monocytes Absolute 0.7  0.1 - 1.0 K/uL   Eosinophils Relative 2  0 - 5 %   Eosinophils Absolute 0.2  0.0  - 0.7 K/uL   Basophils Relative 1  0 - 1 %   Basophils Absolute 0.0  0.0 - 0.1 K/uL  GLUCOSE, CAPILLARY     Status: Abnormal   Collection Time    05/21/14 11:14 AM      Result Value Ref Range   Glucose-Capillary 173 (*) 70 - 99 mg/dL   Comment 1 Notify RN    GLUCOSE, CAPILLARY     Status: Abnormal   Collection Time    05/21/14  4:51 PM      Result Value Ref Range   Glucose-Capillary 117 (*) 70 - 99 mg/dL  GLUCOSE, CAPILLARY     Status: None   Collection Time    05/21/14  9:23 PM      Result Value Ref Range   Glucose-Capillary 82  70 - 99 mg/dL  GLUCOSE, CAPILLARY     Status: Abnormal   Collection Time    05/22/14  7:16 AM      Result Value Ref Range   Glucose-Capillary 109 (*) 70 - 99 mg/dL   Comment 1 Notify RN        Constitutional: He is oriented to person, place, and time. He appears well-developed and well-nourished.  HENT:  Head: Normocephalic and atraumatic.  Eyes: Conjunctivae are normal. Pupils are equal, round, and reactive to light.  Neck: Normal range of motion. Neck supple.  Cardiovascular: Normal rate and regular rhythm.  Murmur heard.  Respiratory: Effort normal. No accessory muscle usage. No respiratory distress. He has decreased breath sounds in the left lower field. He has no wheezes.  GI: Soft. Bowel sounds are normal. He exhibits no distension. There is no tenderness.  Musculoskeletal:  Left foot with dried wound, no drainage.  Neurological: He is alert and oriented to person, place, and time.  Speech soft but clear.  Follows commands without difficulty. BUE's 4/5 prox to distal. LLE 2/5 HF, KE, foot, RLE 3/5 HF, KE and foot. Decreased PP and LT in both feet and fingers.  Skin: Skin is warm and dry.  RLE graft incisions with skin glue.    Assessment/Plan: 1. Functional deficits secondary to deconditioning after CABG which require 3+ hours per day of interdisciplinary therapy in a comprehensive inpatient rehab setting. Physiatrist is providing close  team supervision and 24 hour management of active medical problems listed below. Physiatrist and rehab team continue to assess barriers to discharge/monitor patient progress toward functional and medical goals. FIM: FIM - Bathing Bathing Steps Patient Completed: Chest;Right Arm;Left Arm;Abdomen;Front perineal area;Buttocks;Right upper leg;Left upper leg;Right lower leg (including foot) Bathing: 6: Assistive device (Comment)  FIM - Upper Body Dressing/Undressing Upper body dressing/undressing steps patient completed: Thread/unthread right sleeve of pullover shirt/dresss;Put head through opening of pull over shirt/dress;Thread/unthread left sleeve of pullover shirt/dress;Pull shirt over trunk Upper body dressing/undressing: 0: Wears gown/pajamas-no public clothing FIM - Lower Body Dressing/Undressing Lower body dressing/undressing steps patient completed: Thread/unthread right pants leg;Thread/unthread left pants leg;Pull pants up/down;Don/Doff right sock;Don/Doff left sock Lower body dressing/undressing: 6: More than reasonable amount of time  FIM - Toileting Toileting steps completed by patient: Adjust clothing prior to toileting;Performs perineal hygiene;Adjust clothing after toileting Toileting Assistive Devices: Grab bar or rail for support Toileting: 5: Supervision: Safety issues/verbal cues  FIM - Radio producer Devices: Grab bars Toilet Transfers: 5-To toilet/BSC: Supervision (verbal cues/safety issues)  FIM - Control and instrumentation engineer Devices: Copy: 5: Chair or W/C > Bed: Supervision (verbal cues/safety issues);5: Bed > Chair or W/C: Supervision (verbal cues/safety issues);5: Sit > Supine: Supervision (verbal cues/safety issues);5: Supine > Sit: Supervision (verbal cues/safety issues)  FIM - Locomotion: Wheelchair Distance: 120 Locomotion: Wheelchair: 5: Travels 150 ft or more: maneuvers on rugs and over door  sills with supervision, cueing or coaxing FIM - Locomotion: Ambulation Locomotion: Ambulation Assistive Devices: Administrator Ambulation/Gait Assistance: 5: Supervision Locomotion: Ambulation: 5: Travels 150 ft or more with supervision/safety issues  Comprehension Comprehension Mode: Auditory Comprehension: 7-Follows complex conversation/direction: With no assist  Expression Expression Mode: Verbal Expression: 7-Expresses complex ideas: With no assist  Social Interaction Social Interaction: 7-Interacts appropriately with others - No medications needed.  Problem Solving Problem Solving: 6-Solves complex problems: With extra time  Memory Memory: 5-Requires cues to use assistive device  Medical Problem List and Plan:  1. Functional deficits secondary to Deconditioning after CABG and multiple medical  2. DVT Prophylaxis/Anticoagulation: Pharmaceutical: Lovenox  3. Pain Management: prn oxycodone is effective.  4. Mood: Has a supportive wife who is off for the summer. LCSW to follow for evaluation and support.  5. Neuropsych: This patient is capable of making decisions on his own behalf.  6. Fluid overload: Low salt diet.   Off lasix at this time. Appears  Negative still 7. DM type 2: amaryl with good control at present 8. PVD with multiple left foot ulcers: Continue DARCO shoe for pressure relief.  9. ABLA: H/H with slow improvement.  Added iron supplement  10. Acute on CKD: Will continue to monitor renal status for now. Push po fluids. Avoid nephrotoxic medications  11. E Coli UT---cipro for 7 days 12. CAD: Continue coreg, lipitor and ASA.  13. Constipation: improved , monitor  14.orthostatic hypotension---probably a little volume depleted  -encourage fluids  -continue coreg  -abd binder, teds,   LOS (Days) 7 A FACE TO FACE EVALUATION WAS  PERFORMED  Dwaine Pringle T 05/22/2014, 8:06 AM

## 2014-05-22 NOTE — Progress Notes (Signed)
Addendum: Patient voided 225 ml at 0628. PVR: 729. Spoke to [employer], [place]. Per order, patient straight cath for 675 ml; clear yellow urine return. Patient tolerated well. Cervando Durnin A. Janny Crute, RN...Marland Kitchen

## 2014-05-22 NOTE — Progress Notes (Signed)
Patient educated on intermittent self-catheterization using the following methods: teach-back, video and handout. Patient verbalized understanding. Soila Printup A. Maela Takeda, RN

## 2014-05-22 NOTE — Progress Notes (Signed)
Orthopedic Tech Progress Note Patient Details:  [name] [name] 1961-01-02 [id number]  Ortho Devices Type of Ortho Device: Abdominal binder Ortho Device/Splint Interventions: Application   Cammer, Theodoro Parma 05/22/2014, 1:57 PM

## 2014-05-22 NOTE — Progress Notes (Addendum)
Physical Therapy Session Note  Patient Details  Name: [name] [name] MRN: [id number] Date of Birth: Mar 13, 1961  Today's Date: 05/22/2014 Time:0825-0928 & [id number]  Time Calculation (min): 63 min &  49 min   Short Term Goals: Week 1:  PT Short Term Goal 1 (Week 1): = LTG of mod I overall secondary to ELOS  Skilled Therapeutic Interventions/Progress Updates:   Session 1 (63 min): Pt. Supine in bed having just completed breakfast. Pt. Agreeable to therapy; states abdominal binder arrived for use.  Gait: 250' x2, 70' x1, 180' x 1, 400' x1, 200' x 1 (total 1350') with RW at supervision. Pt. Required occasional verbal cue for step width related to increasing fatigue. Pt. Demonstrates vastly improved endurance and tolerance ambulation; attribute performance increase to donning abdominal binder to control orthostatic hypotension. Pt. Limited by respiration that became increasingly labored during long ambulation trials.  NMReEd: single leg stance, tandem stance, dynamic balance with anterior reaching utilizing ankle strategy only. Pt. Had one LOB requiring stepping strategy to maintain balance. Pt. States he can see an improvement in performance due to abdominal binder; therapist agrees.  Therapeutic Activities (TA): Transfers: supine>sit x1 (S) for sternal precautions; sit<>stand x 8 at (S) during ambulation post rest/recovery breaks. Pt. Demonstrating improved transfers due to improved balance and increased BLE strength. Steps 8 up/down with (S) and use of x2 rails for support/balance; Pt. Uses step-to gait pattern on stairs.  No c/o pain.  Session 2 (49 min): Pt. Supine in bed having just completed breakfast. Pt. Agreeable to therapy; states abdominal binder arrived for use. Toileted prior to session. RN notified.  NMReEd: Administered Berg Balance Scale testing during tx sesison due to stability of Orthostatic Hypotension status. Patient demonstrates increased fall risk as noted by score of  36/56 on Berg Balance Scale.  (<36= high risk for falls, close to 100%; 37-45 significant >80%; 46-51 moderate >50%; 52-55 lower >25%)  Gait: Pt. Ambulated 150' x 6 with occasional standing or seated rest/recovery break. Pt. Demonstrates increased gait velocity with improved balance and trunk control. Pt had no LOBs, and uses appropriate energy conservation strategies.  No c/o of pain.  Therapy Documentation Precautions:  Precautions Precautions: Sternal;Fall Precaution Comments: sternal precautions Required Braces or Orthoses: Other Brace/Splint Other Brace/Splint: flat darco shoe L foot Restrictions Weight Bearing Restrictions: No Other Position/Activity Restrictions: offload lt metatarsals as able  Pain: Pain Assessment Pain Assessment: No/denies pain  Locomotion : Ambulation Ambulation/Gait Assistance: 5: Supervision Wheelchair Mobility Distance: 225     See FIM for current functional status  Therapy/Group: Individual Therapy  Juluis Mire 05/22/2014, 3:38 PM

## 2014-05-22 NOTE — Progress Notes (Signed)
Patient has been requiring cath early am. Reports was able to void small amount but unable to need to urinate. Does better when up and about. He does have history of hesitancy. Was cath for 700 cc this am. Will teach patient and wife cath techniques.

## 2014-05-22 NOTE — Progress Notes (Signed)
Occupational Therapy Session Note  Patient Details  Name: [name] [name] MRN: [id number] Date of Birth: 1961-08-21  Today's Date: 05/22/2014 Time: 1035-1130 Time Calculation (min): 55 min  Short Term Goals: Week 1:  OT Short Term Goal 1 (Week 1): Patient will perform sit<>stand with supervision in order to increase independence with LB ADLs OT Short Term Goal 2 (Week 1): Patient will perform toilet transfers with supervision OT Short Term Goal 3 (Week 1): Patient will perform UB/LB dressing with supervision OT Short Term Goal 4 (Week 1): Patient will adere to sternal precautions at least 75% of the time independently  Skilled Therapeutic Interventions/Progress Updates:  Patient received seated in recliner with no complaints of pain. Patient transferred recliner > w/c using RW with min verbal cues for safety. Patient then performed UB/LB bathing & dressing in sit<>stand position from w/c at sink. Therapist wrapped LLE with ace secondary to patient with hypotension orthostatic issues over CIR course, TED > RLE. Therapist also donned abdominal binder. Patient then ambulated from room > therapy gym and transferred onto NuStep machine. Patient engaged in therapeutic exercise on NuStep on level 5 for 12 minutes for focus on BLE functional strengthening in order to increase patient's independence with functional mobility/transfers & overall strength/endurance. No UE usage on NuStep. Patient then ambulated > therapy mat for exercise focusing on sit<>stands without breaking sternal precautions. Patient continues to require min assist to adhere to sternal precautions during functional tasks and functional mobility (including bed mobility, sit<>stands, etc). At end of session, left patient seated on therapy mat waiting on therapist for next session.  Precautions:  Precautions Precautions: Sternal;Fall Precaution Comments: sternal precautions Required Braces or Orthoses: Other Brace/Splint Other  Brace/Splint: flat darco shoe L foot Restrictions Weight Bearing Restrictions: No Other Position/Activity Restrictions: offload lt metatarsals as able  See FIM for current functional status  Therapy/Group: Individual Therapy  Dontez Hauss 05/22/2014, 11:57 AM

## 2014-05-23 ENCOUNTER — Inpatient Hospital Stay (HOSPITAL_COMMUNITY): Payer: BC Managed Care – PPO

## 2014-05-23 ENCOUNTER — Encounter (HOSPITAL_COMMUNITY): Payer: BC Managed Care – PPO | Admitting: Occupational Therapy

## 2014-05-23 ENCOUNTER — Inpatient Hospital Stay (HOSPITAL_COMMUNITY): Payer: BC Managed Care – PPO | Admitting: *Deleted

## 2014-05-23 DIAGNOSIS — R5381 Other malaise: Secondary | ICD-10-CM

## 2014-05-23 LAB — GLUCOSE, CAPILLARY
GLUCOSE-CAPILLARY: 108 mg/dL — AB (ref 70–99)
Glucose-Capillary: 140 mg/dL — ABNORMAL HIGH (ref 70–99)
Glucose-Capillary: 88 mg/dL (ref 70–99)
Glucose-Capillary: 92 mg/dL (ref 70–99)

## 2014-05-23 MED ORDER — TAMSULOSIN HCL 0.4 MG PO CAPS
0.4000 mg | ORAL_CAPSULE | Freq: Every day | ORAL | Status: DC
  Administered 2014-05-23 – 2014-05-24 (×2): 0.4 mg via ORAL
  Filled 2014-05-23 (×4): qty 1

## 2014-05-23 NOTE — Progress Notes (Signed)
Recreational Therapy Discharge Summary Patient Details  Name: [name] [name] MRN: [id number] Date of Birth: Sep 23, 1961 Today's Date: 05/23/2014  Long term goals set: 1  Long term goals met: 1  Comments on progress toward goals: Pt has made great progress toward goal meeting set up/supervision level for simple TR tasks seated.  Pt is discharging home with wife to provide 24 hour supervision. Reasons for discharge: discharge from hospital Patient/family agrees with progress made and goals achieved: Yes  Marieke Lubke 05/23/2014, 5:10 PM

## 2014-05-23 NOTE — Progress Notes (Signed)
Recreational Therapy Session Note  Patient Details  Name: [name] [name] MRN: [id number] Date of Birth: Mar 29, 1961 Today's Date: 05/23/2014  Pain: no c/o Skilled Therapeutic Interventions/Progress Updates: Session focused on discharge planning & education on community reintegration.  Pt requesting to go outside.  Once outside discussion about energy conservation techniques in which pt able to state potential methods of conservation.  Pt also able to identify potential obstacles to community pursuits listing activity tolerance as primary concern.  Pt discussed potential use of Rollator (4wheel walker) in community that allowed seated rest breaks as needed.  PT had introduced rollator to pt in previous session & is checking with SW to see if an option for pt at discharge.  Pt supervision for simple TR tasks seated needing set up assist.    Therapy/Group: Individual Therapy   Shelanda Duvall 05/23/2014, 10:45 AM

## 2014-05-23 NOTE — Progress Notes (Signed)
53 y.o. 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 Hospital course at that time complicated by AKI due to antibiotics as well as SOB post op and was found to have EF 15% and defibrillator placed prior to d/c. He had a stress cardiolyte in March 2015 showing a fixed inferior and reversible lateral defect with an EF of 40% and had improvement in EF and ICD removed. He has had intermittent chest discomfort as well as dizziness and falls question due to medication SE. Cardiac cath at [facility] revealed severe multivessel disease and he was admitted on 05/07/14 for CABG by Dr. Cyndia Bent.  Subjective/Complaints: orthostasis better yesterday with binder/teds/ace. Still retaining urine A  review of systems has been performed and if not noted above is otherwise negative.   Objective: Vital Signs: Blood pressure 131/76, pulse 79, temperature 98.3 F (36.8 C), temperature source Oral, resp. rate 19, height 6' 0.84" (1.85 m), weight 99.9 kg (220 lb 3.8 oz), SpO2 98.00%. No results found. Results for orders placed during the hospital encounter of 05/15/14 (from the past 72 hour(s))  GLUCOSE, CAPILLARY     Status: Abnormal   Collection Time    05/20/14 11:08 AM      Result Value Ref Range   Glucose-Capillary 151 (*) 70 - 99 mg/dL  GLUCOSE, CAPILLARY     Status: Abnormal   Collection Time    05/20/14  4:41 PM      Result Value Ref Range   Glucose-Capillary 113 (*) 70 - 99 mg/dL  GLUCOSE, CAPILLARY     Status: Abnormal   Collection Time    05/20/14  8:04 PM      Result Value Ref Range   Glucose-Capillary 123 (*) 70 - 99 mg/dL  GLUCOSE, CAPILLARY     Status: None   Collection Time    05/21/14  7:03 AM      Result Value Ref Range   Glucose-Capillary 93  70 - 99 mg/dL  BASIC METABOLIC PANEL     Status: Abnormal   Collection Time    05/21/14  7:09 AM      Result Value Ref Range   Sodium 139  137 - 147 mEq/L   Potassium 5.2  3.7 - 5.3 mEq/L   Chloride 101  96 - 112 mEq/L   CO2 27  19 - 32 mEq/L   Glucose, Bld 98  70 - 99 mg/dL   BUN 23  6 - 23 mg/dL   Creatinine, Ser 1.45 (*) 0.50 - 1.35 mg/dL   Calcium 9.2  8.4 - 10.5 mg/dL   GFR calc non Af Amer 54 (*) >90 mL/min   GFR calc Af Amer 63 (*) >90 mL/min   Comment: (NOTE)     The eGFR has been calculated using the CKD EPI equation.     This calculation has not been validated in all clinical situations.     eGFR's persistently <90 mL/min signify possible Chronic Kidney     Disease.   Anion gap 11  5 - 15  CBC WITH DIFFERENTIAL     Status: Abnormal   Collection Time    05/21/14  7:09 AM      Result Value Ref Range   WBC 6.4  4.0 - 10.5 K/uL   RBC 3.35 (*) 4.22 - 5.81 MIL/uL   Hemoglobin 9.6 (*)  13.0 - 17.0 g/dL   HCT 29.7 (*) 39.0 - 52.0 %   MCV 88.7  78.0 - 100.0 fL   MCH 28.7  26.0 - 34.0 pg   MCHC 32.3  30.0 - 36.0 g/dL   RDW 12.6  11.5 - 15.5 %   Platelets 405 (*) 150 - 400 K/uL   Neutrophils Relative % 51  43 - 77 %   Neutro Abs 3.3  1.7 - 7.7 K/uL   Lymphocytes Relative 35  12 - 46 %   Lymphs Abs 2.2  0.7 - 4.0 K/uL   Monocytes Relative 11  3 - 12 %   Monocytes Absolute 0.7  0.1 - 1.0 K/uL   Eosinophils Relative 2  0 - 5 %   Eosinophils Absolute 0.2  0.0 - 0.7 K/uL   Basophils Relative 1  0 - 1 %   Basophils Absolute 0.0  0.0 - 0.1 K/uL  GLUCOSE, CAPILLARY     Status: Abnormal   Collection Time    05/21/14 11:14 AM      Result Value Ref Range   Glucose-Capillary 173 (*) 70 - 99 mg/dL   Comment 1 Notify RN    GLUCOSE, CAPILLARY     Status: Abnormal   Collection Time    05/21/14  4:51 PM      Result Value Ref Range   Glucose-Capillary 117 (*) 70 - 99 mg/dL  GLUCOSE, CAPILLARY     Status: None   Collection Time    05/21/14  9:23 PM      Result Value Ref Range   Glucose-Capillary 82  70 - 99 mg/dL  GLUCOSE, CAPILLARY      Status: Abnormal   Collection Time    05/22/14  7:16 AM      Result Value Ref Range   Glucose-Capillary 109 (*) 70 - 99 mg/dL   Comment 1 Notify RN    GLUCOSE, CAPILLARY     Status: Abnormal   Collection Time    05/22/14 11:59 AM      Result Value Ref Range   Glucose-Capillary 120 (*) 70 - 99 mg/dL   Comment 1 Notify RN    GLUCOSE, CAPILLARY     Status: Abnormal   Collection Time    05/22/14  4:23 PM      Result Value Ref Range   Glucose-Capillary 133 (*) 70 - 99 mg/dL  GLUCOSE, CAPILLARY     Status: None   Collection Time    05/22/14  9:03 PM      Result Value Ref Range   Glucose-Capillary 95  70 - 99 mg/dL  GLUCOSE, CAPILLARY     Status: Abnormal   Collection Time    05/23/14  7:09 AM      Result Value Ref Range   Glucose-Capillary 108 (*) 70 - 99 mg/dL   Comment 1 Notify RN        Constitutional: He is oriented to person, place, and time. He appears well-developed and well-nourished.  HENT:  Head: Normocephalic and atraumatic.  Eyes: Conjunctivae are normal. Pupils are equal, round, and reactive to light.  Neck: Normal range of motion. Neck supple.  Cardiovascular: Normal rate and regular rhythm.  Murmur heard.  Respiratory: Effort normal. No accessory muscle usage. No respiratory distress. He has decreased breath sounds in the left lower field. He has no wheezes.  GI: Soft. Bowel sounds are normal. He exhibits no distension. There is no tenderness.  Musculoskeletal:  Left foot with dry wound, no  drainage.  Neurological: He is alert and oriented to person, place, and time.  Speech soft but clear. Follows commands without difficulty. BUE's 4/5 prox to distal. LLE 2/5 HF, KE, foot, RLE 3/5 HF, KE and foot. Decreased PP and LT in both feet and fingers.  Skin: Skin is warm and dry.  RLE graft incisions with skin glue.    Assessment/Plan: 1. Functional deficits secondary to deconditioning after CABG which require 3+ hours per day of interdisciplinary therapy in a  comprehensive inpatient rehab setting. Physiatrist is providing close team supervision and 24 hour management of active medical problems listed below. Physiatrist and rehab team continue to assess barriers to discharge/monitor patient progress toward functional and medical goals. FIM: FIM - Bathing Bathing Steps Patient Completed: Chest;Right Arm;Left Arm;Abdomen;Front perineal area;Buttocks;Right upper leg;Left upper leg;Right lower leg (including foot) Bathing: 5: Supervision: Safety issues/verbal cues  FIM - Upper Body Dressing/Undressing Upper body dressing/undressing steps patient completed: Thread/unthread right sleeve of pullover shirt/dresss;Put head through opening of pull over shirt/dress;Thread/unthread left sleeve of pullover shirt/dress;Pull shirt over trunk Upper body dressing/undressing: 5: Set-up assist to: Obtain clothing/put away FIM - Lower Body Dressing/Undressing Lower body dressing/undressing steps patient completed: Thread/unthread right pants leg;Thread/unthread left pants leg;Pull pants up/down;Don/Doff right sock;Don/Doff left sock;Don/Doff right shoe;Don/Doff left shoe;Fasten/unfasten right shoe;Fasten/unfasten left shoe Lower body dressing/undressing: 5: Supervision: Safety issues/verbal cues  FIM - Toileting Toileting steps completed by patient: Adjust clothing prior to toileting;Performs perineal hygiene;Adjust clothing after toileting Toileting Assistive Devices: Grab bar or rail for support Toileting: 5: Supervision: Safety issues/verbal cues  FIM - Radio producer Devices: Grab bars Toilet Transfers: 5-To toilet/BSC: Supervision (verbal cues/safety issues)  FIM - Control and instrumentation engineer Devices: Bed rails;Walker Bed/Chair Transfer: 5: Chair or W/C > Bed: Supervision (verbal cues/safety issues);5: Bed > Chair or W/C: Supervision (verbal cues/safety issues);5: Supine > Sit: Supervision (verbal cues/safety  issues)  FIM - Locomotion: Wheelchair Distance: 225 Locomotion: Wheelchair: 5: Travels 150 ft or more: maneuvers on rugs and over door sills with supervision, cueing or coaxing FIM - Locomotion: Ambulation Locomotion: Ambulation Assistive Devices: Administrator Ambulation/Gait Assistance: 5: Supervision Locomotion: Ambulation: 5: Travels 150 ft or more with supervision/safety issues  Comprehension Comprehension Mode: Auditory Comprehension: 7-Follows complex conversation/direction: With no assist  Expression Expression Mode: Verbal Expression: 7-Expresses complex ideas: With no assist  Social Interaction Social Interaction: 7-Interacts appropriately with others - No medications needed.  Problem Solving Problem Solving: 6-Solves complex problems: With extra time  Memory Memory: 5-Requires cues to use assistive device  Medical Problem List and Plan:  1. Functional deficits secondary to Deconditioning after CABG and multiple medical  2. DVT Prophylaxis/Anticoagulation: Pharmaceutical: Lovenox  3. Pain Management: prn oxycodone is effective.  4. Mood: Has a supportive wife who is off for the summer. LCSW to follow for evaluation and support.  5. Neuropsych: This patient is capable of making decisions on his own behalf.  6. Fluid overload: Low salt diet.   Off lasix at this time. Appears  Negative still 7. DM type 2: amaryl with good control at present 8. PVD with multiple left foot ulcers: Continue DARCO shoe for pressure relief.  9. ABLA: H/H with slow improvement.  Added iron supplement  10. Acute on CKD: Will continue to monitor renal status for now. Push po fluids. Avoid nephrotoxic medications  11. E Coli UT---cipro for 7 days  -added flomax qhs for urine retention  -needs to be up to toilet to empty 12. CAD: Continue coreg,  lipitor and ASA.  13. Constipation: improved , monitor  14.orthostatic hypotension--  a little volume depleted  -encourage fluids  -continue  coreg  -abd binder, teds,   LOS (Days) 8 A FACE TO FACE EVALUATION WAS PERFORMED  [name] [name] T 05/23/2014, 8:22 AM

## 2014-05-23 NOTE — Consult Note (Signed)
WOC wound follow up  Xray left foot: Interval amputation of the third and fourth toes. Evidence of osteomyelitis affecting the metatarsal head of the second toe and the proximal phalanges of the second and fifth toes. Not surprising and plans for podiatry to address with amputations once he has recovered from his heart surgery.   Wound type: Neuropathic foot ulcer x 2;left foot Measurement: Open area plantar surface at the base of the 2nd metatarsal is 1.0cmx 1.0cm by 2.5cm now. The hyperkeratotic area is larger but not open.  It tunnels anterior which is the 2.5 cm depth. Lateral wound at the fifth metatrsal is 0.5cm x 0.75cm x 0.2cm   Wound bed: plantar surface wound is hard to visualize, appears pink but does have some yellow drainage; lateral wound is clean and pink  Drainage (amount, consistency, odor) more drainage from the plantar surface wound however controlled well with silver hydrofiber and the dressing is not saturated thru Periwound:intact with scarring from previous amputation and hyperkeratosis as described above.  Dressing procedure/placement/frequency: Continue silver hydrofiber packing to the plantar wound, making sure to pack fully. Cover the lateral wound with silver hydrofiber also. Change daily. Offload with Darco shoe as much as possible.  Discussed importance of follow up when he is discharged with Dr. Vickki Muff.  He will contact him for appt. As soon as dc to home.  Discussed POC with patient and bedside nurse.  Re consult if needed, will not follow at this time. Thanks  Deztiny Sarra Kellogg, [place] [phone])

## 2014-05-23 NOTE — Progress Notes (Signed)
Occupational Therapy Session Note  Patient Details  Name: [name] [name] MRN: [id number] Date of Birth: 08/23/1961  Today's Date: 05/23/2014 Time: 1035-1130 Time Calculation (min): 55 min  Short Term Goals: Week 1:  OT Short Term Goal 1 (Week 1): Patient will perform sit<>stand with supervision in order to increase independence with LB ADLs OT Short Term Goal 2 (Week 1): Patient will perform toilet transfers with supervision OT Short Term Goal 3 (Week 1): Patient will perform UB/LB dressing with supervision OT Short Term Goal 4 (Week 1): Patient will adere to sternal precautions at least 75% of the time independently  Skilled Therapeutic Interventions/Progress Updates:  No complaints of pain Patient received seated in recliner. Therapist talked with patient about recommended 24/7 supervision and encouraged patient's wife to come in for family education. Plan is for patient's wife to come in tomorrow(7/9) from 0800-1000 for OT/PT education. Patient continues to present with poor safety awareness, decreased RW safety, and decreased memory for sternal precautions. Goals were downgraded yesterday to an overall supervision level secondary to the above stated safety concerns. Patient requires min verbal cues in order to adhere to sternal precautions at times. During OT ADL session, patient stood from recliner and stopped to doff shirt. Patient lost his balance, therapist provided min assist to help patient re-gain balance. Therapist recommended patient sit for donning/doffing of shirt. Patient continued to ambulate > shower stall. Patient covered L foot and completed UB/LB bathing in seated position. Patient then ambulated back to w/c for UB/LB dressing. Patient with poor RW safety awareness during session, pushing walker to the side when sitting; therapist educated patient on safety with RW. Patient sat at sink for grooming tasks independently. Therapist donned TED hose > RLE and ace wrap > LLE. Patient  wife present at end of session. This OT discussed sternal precautions, safety with RW, and patient's overall decreased RW safety with patient's wife. Also discussed importance of 24/7 supervision at home. Patient's wife understood and receptive to this information. At end of session, left patient seated in w/c with all needs within reach.   Precautions:  Precautions Precautions: Sternal;Fall Precaution Comments: sternal precautions Required Braces or Orthoses: Other Brace/Splint Other Brace/Splint: flat darco shoe L foot Restrictions Weight Bearing Restrictions: No Other Position/Activity Restrictions: offload lt metatarsals as able  See FIM for current functional status  Therapy/Group: Individual Therapy  Ila Landowski 05/23/2014, 12:05 PM

## 2014-05-23 NOTE — Progress Notes (Signed)
Nutrition Brief Note  Patient identified on the Malnutrition Screening Tool (MST) Report  Wt Readings from Last 15 Encounters:  05/23/14 220 lb 3.8 oz (99.9 kg)  05/15/14 225 lb 15.5 oz (102.5 kg)  05/15/14 225 lb 15.5 oz (102.5 kg)  05/03/14 228 lb (103.42 kg)  04/04/14 225 lb (102.059 kg)    Body mass index is 29.19 kg/(m^2). Patient meets criteria for Overweight based on current BMI. Pt denies any recent weight loss stating he maintains his weight around 220-225 lbs.  Current diet order is Heart Healthy/Carb Modified, patient is consuming approximately 100% of meals at this time. Blood glucose WNL. Pt states he follows a diabetic diet at home. He denies any questions or concerns regarding diet/nutrition. Pt requesting evening snack. Labs and medications reviewed.   No nutrition interventions warranted at this time. If nutrition issues arise, please consult RD.   Pryor Ochoa RD, LDN Inpatient Clinical Dietitian Pager: [phone] After Hours Pager: [phone]

## 2014-05-23 NOTE — Progress Notes (Signed)
Physical Therapy Session Note  Patient Details  Name: [name] [name] MRN: [id number] Date of Birth: 1961-03-24  Today's Date: 05/23/2014 Time: 0830-0928 Time Calculation (min): 58 min  Short Term Goals: Week 1:  PT Short Term Goal 1 (Week 1): = LTG of mod I overall secondary to ELOS  Skilled Therapeutic Interventions/Progress Updates:  Pt. Supine in bed with HOB elevated. Pt. States he had a good breakfast, but feeling a little dizzy. Pt. Toileted prior to tx session.  Gait: Pt. Focus on controled movements with RW and rolator with verbal cues and demonstrations as needed for safety. Pt. Ambulated multiple distances: with RW - 450' x 2, 150' x 4; with - rolator 150' x 2 with (S) for safety. Pt. Displays improving endurance with maintained balance and has fewer rest/recovry breaks with a shorter periods. Pt. Has no c/o dizziness during gait/ambulation. Pt. Coached on step width; states he's always walked with heels striking stance leg during ambulation. Pt. Improving gait velocity with increased control of LE, verbal cues for safety when turning in place to maintain proper BOS, and verbal cues to increase breathing/pre-breathing for task/demand.   No c/o pain during session.  Pt. In room recliner with all needs within reach. RT present with patient for next session.  Therapy Documentation Precautions:  Precautions Precautions: Sternal;Fall Precaution Comments: sternal precautions Required Braces or Orthoses: Other Brace/Splint Other Brace/Splint: flat darco shoe L foot Restrictions Weight Bearing Restrictions: No Other Position/Activity Restrictions: offload lt metatarsals as able  Locomotion : Ambulation Ambulation/Gait Assistance: 5: Supervision   See FIM for current functional status  Therapy/Group: Individual Therapy  Juluis Mire 05/23/2014, 9:35 AM

## 2014-05-23 NOTE — Patient Care Conference (Signed)
Inpatient RehabilitationTeam Conference and Plan of Care Update Date: 05/22/2014   Time: 2:40 PM    Patient Name: [name] [name]      Medical Record Number: [id number]  Date of Birth: 1961/03/29 Sex: Male         Room/Bed: 4M01C/4M01C-01 Payor Info: Payor: [place] / Plan: [employer]  PPO / Product Type: *No Product type* /    Admitting Diagnosis: cabg deconditioning  Admit Date/Time:  05/15/2014  4:10 PM Admission Comments: No comment available   Primary Diagnosis:  Physical deconditioning Principal Problem: Physical deconditioning  Patient Active Problem List   Diagnosis Date Noted  . Acute urinary retention 05/21/2014  . E. coli UTI 05/21/2014  . Chronic kidney disease 05/21/2014  . Chronic osteomyelitis of toe of left foot 05/21/2014  . Diabetic ulcer of left foot 05/21/2014  . Physical deconditioning 05/15/2014  . S/P CABG x 4 05/13/2014  . Type II or unspecified type diabetes mellitus without mention of complication, uncontrolled 05/08/2014  . CAD (coronary artery disease)     Expected Discharge Date: Expected Discharge Date: 05/25/14  Team Members Present: Physician leading conference: Dr. Alger Simons Social Worker Present: Lennart Pall, LCSW Nurse Present: Elliot Cousin, RN PT Present: Other (comment) Rada Hay, PT and [place] Shi, PT) OT Present: Salome Spotted, OT;Patricia Lissa Hoard, OT SLP Present: Weston Anna, SLP Other (Discipline and Name): Danne Baxter, RN [facility]) PPS Coordinator present : Daiva Nakayama, RN, CRRN;Becky Alwyn Ren, PT     Current Status/Progress Goal Weekly Team Focus  Medical   deconditioning after CABG and multiple medical  improving safety and adherence to sternal precautions  improve safety, folllow through, wound care   Bowel/Bladder   Continent of bowel and bladder. LBM: 05/21/2014  Patient to remain cont of B&B  Monitor for constipation/diarrhea   Swallow/Nutrition/ Hydration             ADL's   overall supervision>min  assist  overall mod I, may downgrade > supervision  ADL retraining, sit<>stands, adhereing to sternal precautions, functional mobility/transfers, overall activity tolerance/endurance   Mobility   supervision - min A  Mod I; Anticipated d/c home within current week; mild hesitation due to persistent Orthostatic Hypotension LOBs unmanaged by patient.  balance, BLE strengthening, gait, sternal precautions   Communication             Safety/Cognition/ Behavioral Observations            Pain   Tyl 325-650mg  q 4 hrs prn, Oxy IR 5-10mg  q 3 hrs prn, Tramadol 50mg  q 4 hrs prn. Denied pain  Pain managed at 3 or less  Assess and monitor pain and provide appropriate intervention   Skin   Mid sternal incision healing. CDI, OTA. 3 sets of incisions to right of sternal incision. Sutures in place. 3rd & 4th digits on LLE amputated. Drsg intact. L. foot wound. Drsg intact.  No new breakdown; no s/sx of infection  Educate patient/family on the importance of dressing change and skin care    Rehab Goals Patient on target to meet rehab goals: Yes *See Care Plan and progress notes for long and short-term goals.  Barriers to Discharge: safety, balance issues    Possible Resolutions to Barriers:  increased routine, improve stamina and adherence to precautions, wife to supervise at home    Discharge Planning/Teaching Needs:  home with wife who can provide 24/7 supervision      Team Discussion:  Deconditioned pt after CABG.  Foot wounds.  May need to  increase fluids.  BS controlled and UTI treated.  Not following sternal precautions very well and with poor balance.  Hope to be able to end I/O caths.  Feel he can reach supervision level.    Revisions to Treatment Plan:  None   Continued Need for Acute Rehabilitation Level of Care: The patient requires daily medical management by a physician with specialized training in physical medicine and rehabilitation for the following conditions: Daily direction of a  multidisciplinary physical rehabilitation program to ensure safe treatment while eliciting the highest outcome that is of practical value to the patient.: Yes Daily medical management of patient stability for increased activity during participation in an intensive rehabilitation regime.: Yes Daily analysis of laboratory values and/or radiology reports with any subsequent need for medication adjustment of medical intervention for : Neurological problems;Post surgical problems;Pulmonary problems  [name] [name] 05/23/2014, 9:56 AM

## 2014-05-23 NOTE — Progress Notes (Signed)
Physical Therapy Session Note  Patient Details  Name: [name] [name] MRN: [id number] Date of Birth: Dec 15, 1960  Today's Date: 05/23/2014 Time: 1300-1351 & [id number] Time Calculation (min): 59 & 33 min  Short Term Goals: Week 1:  PT Short Term Goal 1 (Week 1): = LTG of mod I overall secondary to ELOS  Skilled Therapeutic Interventions/Progress Updates:  Afternoon Session 1 (59 min):  Pt. Supine in bed with HOB elevated with wife present in room recliner. Wife open to education/participation with patient's session. Pt. Agreeable to therapy.  Gait: Pt. Ambulated >800' with rollator (s) to mod I with increasing skill set. Pt. Ambulated controlled surfaces, uncontrolled/unlevel surfaces, ramps and transitions between surface levels with rollator and minimal verbal cues for setup/prep for safety. Pt. Demonstrates increased mobility and confidence with ambulation, improving endurance quickly and gaining greater BLE step accuracy/surface adaptation. Pt. Has intermittent rest/recovery breaks utilizing seat on rollator. Pt. Demonstrates safe use and turning when sit<>stand transfers on/with rollator. Pt. Wife receptive to instruction, able to provide appropriate verbal cues to patient when needed.   Therapeutic Activities: Transfers: supine<>sit x 1, sit<>Stand x 8+ with (S) with support (RW, rollator, or rails) to mod I. Pt. Performance increasing with greater proprioception awareness, increased BLE muscle strength, and increasing activity endurance.  Pt. To benefit from rollator use in community activities due to reduced stress about having place to sit when fatigued. (pt. Has large frame and hips; large/bariatric rollator facilitates safe ambulation, regular rollator interferes with ambulation--not recommended due to LOB and step interference).  No c/o pain during session.  Pt. Seated in room recliner awaiting next therapy session. All needs are within reach.    Afternoon Session 2 (33 min):   Therapeutic Activities: Stand<>floor transfer with sternal precautions. Activity required full session due to reduced pt. Strength in BLE and limited BUE use to avoid sternal pain. Multiple sit<>Stand transfers with RW related to room<>gym and activity preparation/completion.  Pt. Reports sternal discomfort, BLE discomfort and states "it's been a long day"  Pain: unrated and diminishing with bed rest/ repositioning.   Therapy Documentation Precautions:  Precautions Precautions: Sternal;Fall Precaution Comments: sternal precautions Required Braces or Orthoses: Other Brace/Splint Other Brace/Splint: flat darco shoe L foot Restrictions Weight Bearing Restrictions: No Other Position/Activity Restrictions: offload lt metatarsals as able  Locomotion : Ambulation Ambulation/Gait Assistance: 5: Supervision   See FIM for current functional status  Therapy/Group: Individual Therapy  Juluis Mire 05/23/2014, 4:29 PM

## 2014-05-24 ENCOUNTER — Inpatient Hospital Stay (HOSPITAL_COMMUNITY): Payer: BC Managed Care – PPO | Admitting: Physical Therapy

## 2014-05-24 ENCOUNTER — Inpatient Hospital Stay (HOSPITAL_COMMUNITY): Payer: BC Managed Care – PPO | Admitting: Occupational Therapy

## 2014-05-24 ENCOUNTER — Encounter (HOSPITAL_COMMUNITY): Payer: BC Managed Care – PPO | Admitting: Occupational Therapy

## 2014-05-24 ENCOUNTER — Ambulatory Visit (HOSPITAL_COMMUNITY): Payer: BC Managed Care – PPO | Admitting: Physical Therapy

## 2014-05-24 LAB — GLUCOSE, CAPILLARY
GLUCOSE-CAPILLARY: 132 mg/dL — AB (ref 70–99)
GLUCOSE-CAPILLARY: 120 mg/dL — AB (ref 70–99)
Glucose-Capillary: 103 mg/dL — ABNORMAL HIGH (ref 70–99)
Glucose-Capillary: 84 mg/dL (ref 70–99)

## 2014-05-24 LAB — CULTURE, BLOOD (ROUTINE X 2)
CULTURE: NO GROWTH
Culture: NO GROWTH

## 2014-05-24 NOTE — Progress Notes (Signed)
Occupational Therapy Discharge Summary  Patient Details  Name: Toribio Seiber MRN: [id number] Date of Birth: [birth year]  Today's Date: 05/25/2014  Patient has met 11 of 11 long term goals due to improved activity tolerance, improved balance, postural control, ability to compensate for deficits, improved attention, improved awareness and improved coordination.  Patient to discharge at overall Supervision level.  Patient's care partner is independent to provide the necessary supervision assistance at discharge.    Reasons goals not met: n/a, all goals met at this time.  Recommendation:  Patient will benefit from ongoing skilled OT services in home health setting to continue to advance functional skills in the area of BADL, iADL and Reduce care partner burden.  Equipment: BSC, patient states he has a shower seat for use at home.  Reasons for discharge: treatment goals met and discharge from hospital  Patient/family agrees with progress made and goals achieved: Yes  Precautions/Restrictions  Precautions Precautions: Sternal;Fall Precaution Comments: Patient with hypotension orthostatic symptoms and low BP at times, patient with left TED hose, right ace wrap>knee, and abdominal binder to help regulate this Required Braces or Orthoses: Other Brace/Splint Other Brace/Splint: flat darco shoe L foot Restrictions Weight Bearing Restrictions: No Other Position/Activity Restrictions: offload at metatarsals as able  ADL - See FIM  Vision/Perception  Vision- History Baseline Vision/History: No visual deficits Patient Visual Report: No change from baseline Vision- Assessment Vision Assessment?: No apparent visual deficits   Cognition Overall Cognitive Status: Within Functional Limits for tasks assessed Orientation Level: Oriented X4 Memory: Impaired Memory Impairment: Decreased recall of new information Awareness: Appears intact Problem Solving: Appears intact Safety/Judgment: Impaired  (slightly impaired, patient requires supervision for safety)  Sensation Sensation Light Touch: Impaired Detail Light Touch Impaired Details: Impaired RLE;Impaired LLE;Absent RLE;Absent LLE Stereognosis: Not tested Hot/Cold: Not tested Proprioception: Not tested Additional Comments: bilat LE impaired to light touch but absent bilat feet; L > R impaired sensation; RUE appear intact Coordination Gross Motor Movements are Fluid and Coordinated: Yes Fine Motor Movements are Fluid and Coordinated: Yes  Motor  Motor Motor: Within Functional Limits  Mobility  Bed Mobility Bed Mobility: Supine to Sit;Sit to Supine Supine to Sit: 6: Modified independent (Device/Increase time) Sit to Supine: 6: Modified independent (Device/Increase time)   Trunk/Postural Assessment  Cervical Assessment Cervical Assessment: Within Functional Limits Thoracic Assessment Thoracic Assessment: Within Functional Limits Lumbar Assessment Lumbar Assessment: Within Functional Limits Postural Control Postural Control:  (same as eval)   Balance Balance Balance Assessed: Yes Standardized Balance Assessment Standardized Balance Assessment: Berg Balance Test Berg Balance Test Sit to Stand: Able to stand  independently using hands Standing Unsupported: Able to stand 2 minutes with supervision Sitting with Back Unsupported but Feet Supported on Floor or Stool: Able to sit safely and securely 2 minutes Stand to Sit: Controls descent by using hands Transfers: Able to transfer safely, minor use of hands Standing Unsupported with Eyes Closed: Able to stand 10 seconds with supervision Standing Ubsupported with Feet Together: Able to place feet together independently and stand for 1 minute with supervision From Standing, Reach Forward with Outstretched Arm: Can reach forward >12 cm safely (5") From Standing Position, Pick up Object from Floor: Able to pick up shoe, needs supervision From Standing Position, Turn to  Look Behind Over each Shoulder: Needs supervision when turning Turn 360 Degrees: Able to turn 360 degrees safely but slowly Standing Unsupported, Alternately Place Feet on Step/Stool: Able to complete >2 steps/needs minimal assist Standing Unsupported, One Foot in Front: Able to plae  foot ahead of the other independently and hold 30 seconds Standing on One Leg: Unable to try or needs assist to prevent fall Total Score: 36  Extremity/Trunk Assessment RUE Assessment RUE Assessment: Within Functional Limits LUE Assessment LUE Assessment: Within Functional Limits  See FIM for current functional status  Nyle Limb 05/25/2014, 7:28 AM

## 2014-05-24 NOTE — Progress Notes (Signed)
Social Work Patient ID: [name] [name], male   DOB: May 31, 1961, 53 y.o.   MRN: [id number]   Met yesterday with pt and wife to review team conference.  Both aware and agreeable with targeted d/c date of 7/10 with supervision goals overall.  Wife in this morning to complete family education.  Preparing for d/c tomorrow.  Vandora Jaskulski, LCSW

## 2014-05-24 NOTE — Progress Notes (Signed)
53 y.o. 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 Hospital course at that time complicated by AKI due to antibiotics as well as SOB post op and was found to have EF 15% and defibrillator placed prior to d/c. He had a stress cardiolyte in March 2015 showing a fixed inferior and reversible lateral defect with an EF of 40% and had improvement in EF and ICD removed. He has had intermittent chest discomfort as well as dizziness and falls question due to medication SE. Cardiac cath at [facility] revealed severe multivessel disease and he was admitted on 05/07/14 for CABG by Dr. Cyndia Bent.  Subjective/Complaints: Still some orthostasis noted. Pt does note some dizziness when standing up. Emptying bladder better A  review of systems has been performed and if not noted above is otherwise negative.   Objective: Vital Signs: Blood pressure 149/75, pulse 77, temperature 98.1 F (36.7 C), temperature source Oral, resp. rate 19, height 6' 0.84" (1.85 m), weight 100.472 kg (221 lb 8 oz), SpO2 100.00%. No results found. Results for orders placed during the hospital encounter of 05/15/14 (from the past 72 hour(s))  GLUCOSE, CAPILLARY     Status: Abnormal   Collection Time    05/21/14 11:14 AM      Result Value Ref Range   Glucose-Capillary 173 (*) 70 - 99 mg/dL   Comment 1 Notify RN    GLUCOSE, CAPILLARY     Status: Abnormal   Collection Time    05/21/14  4:51 PM      Result Value Ref Range   Glucose-Capillary 117 (*) 70 - 99 mg/dL  GLUCOSE, CAPILLARY     Status: None   Collection Time    05/21/14  9:23 PM      Result Value Ref Range   Glucose-Capillary 82  70 - 99 mg/dL  GLUCOSE, CAPILLARY     Status: Abnormal   Collection Time    05/22/14  7:16 AM      Result Value Ref Range   Glucose-Capillary 109 (*) 70 - 99  mg/dL   Comment 1 Notify RN    GLUCOSE, CAPILLARY     Status: Abnormal   Collection Time    05/22/14 11:59 AM      Result Value Ref Range   Glucose-Capillary 120 (*) 70 - 99 mg/dL   Comment 1 Notify RN    GLUCOSE, CAPILLARY     Status: Abnormal   Collection Time    05/22/14  4:23 PM      Result Value Ref Range   Glucose-Capillary 133 (*) 70 - 99 mg/dL  GLUCOSE, CAPILLARY     Status: None   Collection Time    05/22/14  9:03 PM      Result Value Ref Range   Glucose-Capillary 95  70 - 99 mg/dL  GLUCOSE, CAPILLARY     Status: Abnormal   Collection Time    05/23/14  7:09 AM      Result Value Ref Range   Glucose-Capillary 108 (*) 70 - 99 mg/dL   Comment 1 Notify RN    GLUCOSE, CAPILLARY     Status: Abnormal   Collection Time    05/23/14 11:34 AM      Result Value Ref Range   Glucose-Capillary 140 (*) 70 - 99 mg/dL   Comment 1 Notify RN  GLUCOSE, CAPILLARY     Status: None   Collection Time    05/23/14  4:28 PM      Result Value Ref Range   Glucose-Capillary 88  70 - 99 mg/dL  GLUCOSE, CAPILLARY     Status: None   Collection Time    05/23/14  8:47 PM      Result Value Ref Range   Glucose-Capillary 92  70 - 99 mg/dL  GLUCOSE, CAPILLARY     Status: Abnormal   Collection Time    05/24/14  7:07 AM      Result Value Ref Range   Glucose-Capillary 103 (*) 70 - 99 mg/dL   Comment 1 Notify RN        Constitutional: He is oriented to person, place, and time. He appears well-developed and well-nourished.  HENT:  Head: Normocephalic and atraumatic.  Eyes: Conjunctivae are normal. Pupils are equal, round, and reactive to light.  Neck: Normal range of motion. Neck supple.  Cardiovascular: Normal rate and regular rhythm.  Murmur heard.  Respiratory: Effort normal. No accessory muscle usage. No respiratory distress. He has decreased breath sounds in the left lower field. He has no wheezes.  GI: Soft. Bowel sounds are normal. He exhibits no distension. There is no tenderness.   Musculoskeletal:  Left foot with dry wound, no drainage.  Neurological: He is alert and oriented to person, place, and time.  Speech soft but clear. Follows commands without difficulty. BUE's 4/5 prox to distal. LLE 2/5 HF, KE, foot, RLE 3/5 HF, KE and foot. Decreased PP and LT in both feet and fingers.  Skin: Skin is warm and dry.  RLE graft incisions intact    Assessment/Plan: 1. Functional deficits secondary to deconditioning after CABG which require 3+ hours per day of interdisciplinary therapy in a comprehensive inpatient rehab setting. Physiatrist is providing close team supervision and 24 hour management of active medical problems listed below. Physiatrist and rehab team continue to assess barriers to discharge/monitor patient progress toward functional and medical goals. FIM: FIM - Bathing Bathing Steps Patient Completed: Chest;Right Arm;Left Arm;Abdomen;Front perineal area;Buttocks;Right upper leg;Left upper leg;Right lower leg (including foot) Bathing: 6: Assistive device (Comment) (seated)  FIM - Upper Body Dressing/Undressing Upper body dressing/undressing steps patient completed: Thread/unthread right sleeve of pullover shirt/dresss;Put head through opening of pull over shirt/dress;Thread/unthread left sleeve of pullover shirt/dress;Pull shirt over trunk Upper body dressing/undressing: 5: Supervision: Safety issues/verbal cues FIM - Lower Body Dressing/Undressing Lower body dressing/undressing steps patient completed: Thread/unthread right pants leg;Thread/unthread left pants leg;Pull pants up/down;Don/Doff right sock;Don/Doff left sock;Don/Doff right shoe;Don/Doff left shoe;Fasten/unfasten right shoe;Fasten/unfasten left shoe Lower body dressing/undressing: 5: Supervision: Safety issues/verbal cues  FIM - Toileting Toileting steps completed by patient: Adjust clothing prior to toileting;Performs perineal hygiene;Adjust clothing after toileting Toileting Assistive Devices:  Grab bar or rail for support Toileting: 6: Assistive device: No helper  FIM - Radio producer Devices: Product manager Transfers: 6-Assistive device: No helper  FIM - Control and instrumentation engineer Devices: Environmental consultant;Arm rests Bed/Chair Transfer: 6: Sit > Supine: No assist  FIM - Locomotion: Wheelchair Distance: 225 Locomotion: Wheelchair: 0: Activity did not occur FIM - Locomotion: Ambulation Locomotion: Ambulation Assistive Devices: Other (comment) (rollator) Ambulation/Gait Assistance: 5: Supervision Locomotion: Ambulation: 5: Travels 150 ft or more with supervision/safety issues  Comprehension Comprehension Mode: Auditory Comprehension: 7-Follows complex conversation/direction: With no assist  Expression Expression Mode: Verbal Expression: 7-Expresses complex ideas: With no assist  Social Interaction Social Interaction: 6-Interacts appropriately with others with  medication or extra time (anti-anxiety, antidepressant).  Problem Solving Problem Solving: 6-Solves complex problems: With extra time  Memory Memory: 5-Recognizes or recalls 90% of the time/requires cueing < 10% of the time  Medical Problem List and Plan:  1. Functional deficits secondary to Deconditioning after CABG and multiple medical  2. DVT Prophylaxis/Anticoagulation: Pharmaceutical: Lovenox  3. Pain Management: prn oxycodone is effective.  4. Mood: Has a supportive wife who is off for the summer. LCSW to follow for evaluation and support.  5. Neuropsych: This patient is capable of making decisions on his own behalf.  6. Fluid overload: Low salt diet.   Off lasix at this time. Appears  Negative still 7. DM type 2: amaryl with good control at present 8. PVD with multiple left foot ulcers: Continue DARCO shoe for pressure relief.  9. ABLA: H/H with slow improvement.  Added iron supplement  10. Acute on CKD: Will continue to monitor renal status for now. Push po  fluids. Avoid nephrotoxic medications  11. E Coli UTI/urine retention---cipro for 7 days  -added flomax qhs for urine retention, pvr's better  -pt capable of self-cathing if needed  -needs to be up to toilet to empty 12. CAD: Continue coreg, lipitor and ASA.  13. Constipation: improved , monitor  14.orthostatic hypotension--     -encouraging fluids  -continue coreg  -abd binder, teds helping  LOS (Days) 9 A FACE TO FACE EVALUATION WAS PERFORMED  SWARTZ,ZACHARY T 05/24/2014, 8:17 AM

## 2014-05-24 NOTE — Progress Notes (Signed)
Occupational Therapy Session Note  Patient Details  Name: [name] [name] MRN: [id number] Date of Birth: 03/12/1961  Today's Date: 05/24/2014 Time: [id number] Time Calculation (min): 38 min   Skilled Therapeutic Interventions/Progress Updates:    Patient seen this pm to address self care / home management skills in ADL kitchen.  Patient discussed simple meals that he would make for himself once home; e.g. Cold cereal, sandwich, etc.  Patient able to safely use rollator to maneuver around kitchen, to sit on at kitchen table, and kitchen countertop, and to transport items from refrigerator to table. Patient needed infrequent cueing for technique, for energy conservation, and for safety.  Patient completed simple meal prep at supervision / cueing level.  Patient with report of lightheadedness.  BP 101/70.  Patient indicates that is fairly common although a bit lower.  Patient wearing right knee high ted hose, left leg ace wrap, and abdominal binder.  Patient able to request rest breaks as needed.    Therapy Documentation Precautions:  Precautions Precautions: Sternal;Fall Precaution Comments: sternal precautions Required Braces or Orthoses: Other Brace/Splint Other Brace/Splint: flat darco shoe L foot Restrictions Weight Bearing Restrictions: No Other Position/Activity Restrictions: offload lt metatarsals as able   Pain:  No report of pain    See FIM for current functional status  Therapy/Group: Individual Therapy  Mariah Milling 05/24/2014, 2:23 PM

## 2014-05-24 NOTE — Progress Notes (Signed)
Physical Therapy Note  Patient Details  Name: Osmar Bartsch MRN: [id number] Date of Birth: April 26, 1961 Today's Date: 05/24/2014  Time: 1125-1155 30 minutes  1:1 No c/o pain.  Treatment session focused on dynamic balance activities.  Pt performed cone tapping in a variety of patterns with 1 LE and alternating LE.  Pt requires min A to correct frequent LOB with SLS.  Tap ups and step ups to 4'' step with alternating LEs with min A needed to step up with L, min A for balance throughout activity.  Lateral stepping and laterally stepping over obstacles with steadying assist.  Pt supervision with gait with rollator > 150'.  Pt states he feels ready for d/c home tomorrow.   [name] [name] 05/24/2014, 11:49 AM

## 2014-05-24 NOTE — Progress Notes (Signed)
Occupational Therapy Session Note  Patient Details  Name: [name] [name] MRN: [id number] Date of Birth: 11-Nov-1961  Today's Date: 05/24/2014 Time: 0800-0855 Time Calculation (min): 55 min  Short Term Goals: Week 1:  OT Short Term Goal 1 (Week 1): Patient will perform sit<>stand with supervision in order to increase independence with LB ADLs OT Short Term Goal 2 (Week 1): Patient will perform toilet transfers with supervision OT Short Term Goal 3 (Week 1): Patient will perform UB/LB dressing with supervision OT Short Term Goal 4 (Week 1): Patient will adere to sternal precautions at least 75% of the time independently  Skilled Therapeutic Interventions/Progress Updates:  [phone] - 55 Minutes No complaints of pain Patient received supine in bed with wife present. Patient engaged in bed mobility with supervision, min assist required for adherence to sternal precautions. Patient then stood with RW and ambulated into bathroom for shower stall transfer. Patient's wife present and provided supervision. Patient completed UB/LB bathing in seated position at mod I level. Patient then ambulated back to room for UB/LB dressing & grooming tasks. Patient ambulated from room > ADL apartment. Focused on simulated shower stall transfer like at home, functional ambulation, and bed mobility; all while adhering to sternal precautions. Therapist also went over UE HEP, patient independent. Patient ambulated back to room using rollator and left seated edge of bed with wife present.  Patient continues to require supervision secondary to decreased safety&RW/rollator awareness and poor dynamic standing balance. Therapist reiterated importance of 24/7 supervision to patient and patient's wife.   Precautions:  Precautions Precautions: Sternal;Fall Precaution Comments: sternal precautions Required Braces or Orthoses: Other Brace/Splint Other Brace/Splint: flat darco shoe L foot Restrictions Weight Bearing Restrictions:  No Other Position/Activity Restrictions: offload lt metatarsals as able  See FIM for current functional status  Therapy/Group: Individual Therapy  [name] [name] 05/24/2014, 9:24 AM

## 2014-05-24 NOTE — Progress Notes (Signed)
Physical Therapy Discharge Summary  Patient Details  Name: [name] [name] MRN: [id number] Date of Birth: 1961-05-14  Today's Date: 05/24/2014 Time: 0900-1000 Time Calculation (min): 60 min  Patient has met 8 of 8 long term goals due to improved activity tolerance, improved balance, improved postural control, increased strength, decreased pain and functional use of  right lower extremity and left lower extremity.  Patient to discharge at an ambulatory level Supervision.   Patient's care partner is independent to provide the necessary supervision assistance at discharge.  Reasons goals not met: All goals met   Recommendation:  Patient will benefit from ongoing skilled PT services in home health and then cardiac rehabilitation to continue to advance safe functional mobility, address ongoing impairments in activity tolerance/endurance, impaired strength, balance, postural control, gait, and minimize fall risk.  Equipment: [phone]  Reasons for discharge: treatment goals met and discharge from hospital  Patient/family agrees with progress made and goals achieved: Yes  PT Discharge Family education session with wife with focus on continued safety and adherence to sternal precautions with all transfers (bed, furniture, car) with wife cuing patient for safety 50% of the time.  Also focused on safe stair negotiation and choosing most appropriate AD for home/community ambulation.  Pt and wife would like 4WW secondary to seat for rest breaks but are requesting a wide 4WW.  Informed pt and wife that insurance would not cover a wide 4WW due to pt not meeting weight requirement.  Trialled pt use of regular 4WW and pt was able to safely sit on regular seat.  Also performed home ambulation training with [phone] with pt navigating kitchen safely, use of brakes when reaching for objects in kitchen and use of 4WW to safely obtain and carry food/drink from one room to another.  Pt did require min cues to perform safely.   Also discussed use of abdominal binder and proper donning and doffing during the day for BP management.  No further questions or concerns from family or pt.      Vital Signs Therapy Vitals Temp: 98.3 F (36.8 C) Temp src: Oral Pulse Rate: 80 Resp: 18 BP: 130/67 mmHg Patient Position (if appropriate): Lying Pain Pain Assessment Pain Assessment: No/denies pain Sensation Sensation Light Touch: Impaired Detail Light Touch Impaired Details: Impaired RLE;Impaired LLE;Absent RLE;Absent LLE Stereognosis: Not tested Hot/Cold: Not tested Additional Comments: bilat LE impaired to light touch but absent bilat feet; L > R impaired sensation Coordination Gross Motor Movements are Fluid and Coordinated: Yes Motor  Motor Motor - Discharge Observations: Generalized weakness  Mobility Bed Mobility Bed Mobility: Supine to Sit;Sit to Supine Supine to Sit: 6: Modified independent (Device/Increase time) Sit to Supine: 6: Modified independent (Device/Increase time) Transfers Stand Pivot Transfers: 5: Supervision Stand Pivot Transfer Details (indicate cue type and reason): Stand pivot transfers from bed, low furniture in ADL apartment and to/from car with [phone] and supervision with 50% cues from wife to adhere to sternal precautions during sit <> stand Locomotion  Ambulation Ambulation/Gait Assistance: 5: Supervision Ambulation Distance (Feet): 300 Feet Ambulation/Gait Assistance Details: use of 4WW for ambulation in home, community and controlled environments with supervision; use of 4WW seat for rest breaks when pt experiences dizziness Stairs / Additional Locomotion Stairs: Yes Stairs Assistance: 5: Supervision Stairs Assistance Details (indicate cue type and reason): Performed stair negotiation training with wife with pt performing up/down 8 steps with 2 rails with supervision and verbal cues for safe step to sequence; pt reports rails are wide at home; educated pt and  wife on use of one rail and  navigating laterally with pt return demonstrating with supervision and verbal cues for safe stepping sequence Stair Management Technique: One rail Left;Two rails;Step to pattern;Forwards;Sideways Number of Stairs: 12 Height of Stairs: 6.5 Wheelchair Mobility Wheelchair Mobility: No  Trunk/Postural Assessment  Cervical Assessment Cervical Assessment: Within Functional Limits Thoracic Assessment Thoracic Assessment: Within Functional Limits Lumbar Assessment Lumbar Assessment: Within Functional Limits Postural Control Postural Control:  (same as eval)  Balance Balance Balance Assessed: Yes Standardized Balance Assessment Standardized Balance Assessment: Berg Balance Test Berg Balance Test Sit to Stand: Able to stand  independently using hands Standing Unsupported: Able to stand 2 minutes with supervision Sitting with Back Unsupported but Feet Supported on Floor or Stool: Able to sit safely and securely 2 minutes Stand to Sit: Controls descent by using hands Transfers: Able to transfer safely, minor use of hands Standing Unsupported with Eyes Closed: Able to stand 10 seconds with supervision Standing Ubsupported with Feet Together: Able to place feet together independently and stand for 1 minute with supervision From Standing, Reach Forward with Outstretched Arm: Can reach forward >12 cm safely (5") From Standing Position, Pick up Object from Floor: Able to pick up shoe, needs supervision From Standing Position, Turn to Look Behind Over each Shoulder: Needs supervision when turning Turn 360 Degrees: Able to turn 360 degrees safely but slowly Standing Unsupported, Alternately Place Feet on Step/Stool: Able to complete >2 steps/needs minimal assist Standing Unsupported, One Foot in Front: Able to plae foot ahead of the other independently and hold 30 seconds Standing on One Leg: Unable to try or needs assist to prevent fall Total Score: 36 Patient demonstrates increased fall risk as  noted by score of 36/56 (improved from 16/56 on eval) on Berg Balance Scale.  (<36= high risk for falls, close to 100%; 37-45 significant >80%; 46-51 moderate >50%; 52-55 lower >25%)  Extremity Assessment  RLE Assessment RLE Assessment: Exceptions to [facility] (4+/5 except hip flexion remains 4-/5) LLE Assessment LLE Assessment: Exceptions to [facility] (4+/5 except hip flexion remains 4-/5)  See FIM for current functional status  Raylene Everts [facility] 05/24/2014, 4:27 PM

## 2014-05-25 DIAGNOSIS — R5381 Other malaise: Secondary | ICD-10-CM

## 2014-05-25 LAB — GLUCOSE, CAPILLARY: GLUCOSE-CAPILLARY: 98 mg/dL (ref 70–99)

## 2014-05-25 MED ORDER — ATORVASTATIN CALCIUM 40 MG PO TABS: 40.0000 mg | ORAL_TABLET | Freq: Every day | ORAL | Status: DC

## 2014-05-25 MED ORDER — ACETAMINOPHEN 325 MG PO TABS: 325.0000 mg | ORAL_TABLET | ORAL | Status: DC | PRN

## 2014-05-25 MED ORDER — SENNOSIDES-DOCUSATE SODIUM 8.6-50 MG PO TABS: 2.0000 | ORAL_TABLET | Freq: Every day | ORAL | Status: DC

## 2014-05-25 MED ORDER — GLIMEPIRIDE 2 MG PO TABS: 2.0000 mg | ORAL_TABLET | Freq: Every day | ORAL | Status: DC

## 2014-05-25 MED ORDER — TAMSULOSIN HCL 0.4 MG PO CAPS: 0.4000 mg | ORAL_CAPSULE | Freq: Every day | ORAL | Status: DC

## 2014-05-25 MED ORDER — POLYSACCHARIDE IRON COMPLEX 150 MG PO CAPS: 150.0000 mg | ORAL_CAPSULE | Freq: Two times a day (BID) | ORAL | Status: DC

## 2014-05-25 MED ORDER — OXYCODONE HCL 5 MG PO TABS: 5.0000 mg | ORAL_TABLET | ORAL | Status: DC | PRN

## 2014-05-25 NOTE — Progress Notes (Signed)
Social Work  Discharge Note  The overall goal for the admission was met for:   Discharge location: Yes - home with wife who can provide supervision  Length of Stay: Yes - 10 days  Discharge activity level: Yes - supervision  Home/community participation: Yes  Services provided included: MD, RD, PT, OT, RN, TR, Pharmacy and [place]: Private Insurance: [employer] of [place]  Follow-up services arranged: Home Health: RN, PT, OT via [facility], DME: rolling walker, 3n1 - [place] and Patient/Family has no preference for HH/DME agencies  Comments (or additional information):  Patient/Family verbalized understanding of follow-up arrangements: Yes  Individual responsible for coordination of the follow-up plan: patient  Confirmed correct DME delivered: [name] [name] 05/25/2014    [place], [place]

## 2014-05-25 NOTE — Discharge Planning (Signed)
Discharge instructions discusses with wife and patient. Catechization was done by patient with wife observing. Wife observed wound care by Algis Liming PA for foot ulcers. Instructions discussed with patient and wife. Indicate understanding.

## 2014-05-25 NOTE — Discharge Instructions (Signed)
Inpatient Rehab Discharge Instructions  [name] [name] Discharge date and time:  05/24/14  Activities/Precautions/ Functional Status: Activity: no lifting, driving, or strenuous exercise for till cleared by Dr. Cyndia Bent. Continue sternal precautions. Diet: cardiac diet with diabetic restrictions.  Wound Care:Keep chest wall wound clean and dry.  Functional status:  ___ No restrictions     ___ Walk up steps independently _X__ 24/7 supervision/assistance   ___ Walk up steps with assistance ___ Intermittent supervision/assistance  ___ Bathe/dress independently ___ Walk with walker     _X__ Bathe/dress with assistance ___ Walk Independently    ___ Shower independently ___ Walk with assistance    ___ Shower with assistance __X_ No alcohol     ___ Return to work/school ________    COMMUNITY REFERRALS UPON DISCHARGE:    Home Health:   PT     OT    RN                    Agency: [facility] Phone: [phone]   Medical Equipment/Items Ordered: rollator, 3n1 commode                                                     Agency/Supplier: [facility] [phone]     Special Instructions: 1. Pack left foot wound with silver alginate dressing and change daily. Wear Darco shoe when walking.  2. Check blood sugars 2-3 times a day--before meals and/or at bedtime. 3. Use additional laxatives if no BM in three days.  4. Cath in morning daily to empty bladder.  5. Call Podiatrist today for follow up to evaluate foot ulcer next week.    My questions have been answered and I understand these instructions. I will adhere to these goals and the provided educational materials after my discharge from the hospital.   Patient/Caregiver Signature _______________________________ Date __________  Clinician Signature _______________________________________ Date __________  Please bring this form and your medication list with you to all your follow-up doctor's appointments.

## 2014-05-25 NOTE — Discharge Summary (Signed)
Physician Discharge Summary  Patient ID: [name] [name] MRN: [id number] DOB/AGE: 51-Oct-1962 51 y.o.  Admit date: 05/15/2014 Discharge date: 05/25/2014  Discharge Diagnoses:  Principal Problem:   Physical deconditioning Active Problems:   Type II or unspecified type diabetes mellitus without mention of complication, uncontrolled   S/P CABG x 4   Acute urinary retention   E. coli UTI   Chronic kidney disease   Chronic osteomyelitis of toe of left foot   Diabetic ulcer of left foot   Discharged Condition:  Stable  Significant Diagnostic Studies: Dg Chest 2 View  05/18/2014   CLINICAL DATA:  Fever and UTIs  EXAM: CHEST  2 VIEW  COMPARISON:  05/14/2014  FINDINGS: Cardiac shadow is mildly enlarged. Postsurgical changes are again seen. No focal infiltrate or sizable effusion is noted. No bony abnormality is seen.  IMPRESSION: No acute abnormality noted.   Electronically Signed   By: Inez Catalina M.D.   On: 05/18/2014 10:12   Dg Foot 2 Views Left  05/18/2014   CLINICAL DATA:  Plantar ulcer with drainage  EXAM: LEFT FOOT - 2 VIEW  COMPARISON:  07/25/2013  FINDINGS: There is been amputation of the third and fourth toes at the distal metatarsal level. Age is of those amputations appear unremarkable. There has been lytic destructive change affecting the second metatarsal head and the proximal phalanges of the second and fifth toes. Great toe does not show any abnormality.  IMPRESSION: Interval amputation of the third and fourth toes. Evidence of osteomyelitis affecting the metatarsal head of the second toe and the proximal phalanges of the second and fifth toes.   Electronically Signed   By: Nelson Chimes M.D.   On: 05/18/2014 12:48    Labs:  Basic Metabolic Panel:  Recent Labs Lab 05/21/14 0709  NA 139  K 5.2  CL 101  CO2 27  GLUCOSE 98  BUN 23  CREATININE 1.45*  CALCIUM 9.2    CBC:  Recent Labs Lab 05/21/14 0709  WBC 6.4  NEUTROABS 3.3  HGB 9.6*  HCT 29.7*  MCV 88.7  PLT 405*     CBG:  Recent Labs Lab 05/24/14 0707 05/24/14 1116 05/24/14 1631 05/24/14 2056 05/25/14 0709  GLUCAP 103* 132* 120* 84 98    Brief HPI:   [name] [name] is a 53 y.o. right hand male with history of diabetes mellitus with peripheral neuropathy, PVD, hypertension, severe multivessel CAD with intermittent chest discomfort as well as dizziness. He was admitted on 05/07/14 for CABG by Dr. Cyndia Bent. Post op pulmonary edema treated with diuresis and ABLA being monitored. DARCO shoe ordered for left foot and silver Hydrofiber for hyperkeratotic periwound with minimal drainage. Foley placed due to urinary retention and voiding trial initiated on 05/14/14 but foley replaced due to inabiltiyt to urinate. CXR with small bilateral pleural effusions and small PTX--IS encouraged. Therapy ongoing and patient noted to be deconditioned due to multiple medical issues as well as limited due to sternal precautions. CIR was recommended by rehab team.     Hospital Course: Sampson Lyndaker was admitted to rehab 05/15/2014 for inpatient therapies to consist of PT, ST and OT at least three hours five days a week. Past admission physiatrist, therapy team and rehab RN have worked together to provide customized collaborative inpatient rehab. Sternal incision has been healing well without signs or symptoms of infection. Blood pressures were monitored every 8 hours and he continued to have orthostatic symptoms. . Abdominal binder as well as thigh high TEDs were ordered  to help with symptoms. He was encouraged to push po fluids to help maintain adequate hydration. Serial check of H/H shows that ABLA is slowly improving. He was noted to be uroseptic on 05/18/14 with fevers due to E coli. He was treated with Cipro X 7 days and deffervesced with treatment. Diabetes has been monitored with ac/hs cbg checks nad glyburide was changed to Amaryl to help decrease SE due to CKD. Po intake has improved and BS are well controlled on current dose  Amaryl. Pain control has been reasonable with use of oxycodone on prn basis.   He was started on bowel program to help with acute on chronic constipation. Foley was discontinued and he has required catheterization in am due to intermittent urinary retention. PVR in early am range from  300-700 cc. Flomax was resumed prior to discharge as orthostatic symptoms have improved. Patient and wife were educated on importance of cath in am to help with bladder decompression as well as monitoring for SE from Flomax. Patient reported problems with hesitancy prior to admission and shows some evidence of enlarged prostate during in and out cath's. Urology follow up has been set for follow up past discharge. Left foot ulcers have been monitored with daily wound care. Xray's of left foot showed chronic osteomyelitis affecting the metatarsal head of the second toe and the proximal phalanges of the second and fifth toes.  Chronic diabetic ulcer on plantar surface is being packed with silver dressing and drainage has resolved with decrease ischemic area around the edges. Left lateral foot ulcer shows clean wound bed and is to be cover by silver dressing. Patient and Wife were educated on in and out cath's as well as wound care. Patient has progressed to supervision level and will continue to receive HHPT, [place] and HHOT by [facility] past discharge.    Rehab course: During patient's stay in rehab weekly team conferences were held to monitor patient's progress, set goals and discuss barriers to discharge.  Patient was requiring moderate assistance with ADL tasks as well as mobility at admission. He has had improvement in activity tolerance, balance, postural control, as well as ability to compensate for deficits. He is ambulating at supervision level with use of RW. He is able to complete upper body care independently and requires supervision for lower body care as well as simple home management tasks. Family education was  done with wife who will provide assistance as needed past discharge.     Disposition: Home   Diet: Heart Healthy/Carb Modified.  Special Instructions: 1. Pack left foot wound with silver alginate dressing and change daily. Wear Darco shoe when walking.  2. Check blood sugars 2-3 times a day--before meals and/or at bedtime. 3. Use additional laxatives if no BM in three days.  4. Cath in morning daily to empty bladder.  5. Call Podiatrist today for follow up to evaluate foot ulcer next week.       Medication List    STOP taking these medications       glyBURIDE 5 MG tablet  Commonly known as:  DIABETA      TAKE these medications       acetaminophen 325 MG tablet  Commonly known as:  TYLENOL  Take 1-2 tablets (325-650 mg total) by mouth every 4 (four) hours as needed for mild pain.     aspirin 325 MG EC tablet  Take 1 tablet (325 mg total) by mouth daily.     atorvastatin 40 MG tablet  Commonly known as:  LIPITOR  Take 1 tablet (40 mg total) by mouth daily at 6 PM.     atorvastatin 40 MG tablet  Commonly known as:  LIPITOR  Take 1 tablet (40 mg total) by mouth daily at 6 PM.     carvedilol 3.125 MG tablet  Commonly known as:  COREG  Take 1 tablet (3.125 mg total) by mouth 2 (two) times daily with a meal.     gabapentin 100 MG capsule  Commonly known as:  NEURONTIN  Take 100 mg by mouth 2 (two) times daily.     glimepiride 2 MG tablet  Commonly known as:  AMARYL  Take 1 tablet (2 mg total) by mouth daily with breakfast.     iron polysaccharides 150 MG capsule  Commonly known as:  NIFEREX  Take 1 capsule (150 mg total) by mouth 2 times daily at 12 noon and 4 pm. For anemia     NOVOLOG FLEXPEN 100 UNIT/ML FlexPen  Generic drug:  insulin aspart  Inject 4-8 Units into the skin See admin instructions. Sliding scale     oxyCODONE 5 MG immediate release tablet--Rx 30 pills  Commonly known as:  Oxy IR/ROXICODONE  Take 1-2 tablets (5-10 mg total) by mouth every 3  (three) hours as needed for severe pain.     pantoprazole 40 MG tablet  Commonly known as:  PROTONIX  Take 1 tablet (40 mg total) by mouth daily before breakfast. May stop when discharged from CIR.     senna-docusate 8.6-50 MG per tablet  Commonly known as:  Senokot-S  Take 2 tablets by mouth at bedtime. For constipation     tamsulosin 0.4 MG Caps capsule  Commonly known as:  FLOMAX  Take 1 capsule (0.4 mg total) by mouth daily after supper.       Follow-up Information   Call Meredith Staggers, MD. (As needed)    Specialty:  Physical Medicine and Rehabilitation   Contact information:   [address] [place] [address] [phone]       Follow up with Gaye Pollack, MD On 06/06/2014. (Appointment at 11:30 am)    Specialty:  Cardiothoracic Surgery   Contact information:   [address] [place] [address] [phone]       Follow up with Zara Council, MD On 06/13/2014. (Be there at  9:45. (Voiding issues))    Specialty:  Urology   Contact information:   [address] [place] [address] [phone]       Follow up with Tracie Harrier, MD. Call today. (for post hospital follow up in 2 weeks)    Specialty:  Internal Medicine   Contact information:   [facility] [place] [address] [phone]       Signed: Bary Leriche 05/25/2014, 12:09 PM

## 2014-05-25 NOTE — Progress Notes (Signed)
53 y.o. 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 Hospital course at that time complicated by AKI due to antibiotics as well as SOB post op and was found to have EF 15% and defibrillator placed prior to d/c. He had a stress cardiolyte in March 2015 showing a fixed inferior and reversible lateral defect with an EF of 40% and had improvement in EF and ICD removed. He has had intermittent chest discomfort as well as dizziness and falls question due to medication SE. Cardiac cath at [facility] revealed severe multivessel disease and he was admitted on 05/07/14 for CABG by Dr. Cyndia Bent.  Subjective/Complaints: Some retention noted. 300cc.this am, only 248 yesterday. Pt has not learned to self-cath  Objective: Vital Signs: Blood pressure 123/69, pulse 72, temperature 98 F (36.7 C), temperature source Oral, resp. rate 18, height 6' 0.84" (1.85 m), weight 102.2 kg (225 lb 5 oz), SpO2 100.00%. No results found. Results for orders placed during the hospital encounter of 05/15/14 (from the past 72 hour(s))  GLUCOSE, CAPILLARY     Status: Abnormal   Collection Time    05/22/14 11:59 AM      Result Value Ref Range   Glucose-Capillary 120 (*) 70 - 99 mg/dL   Comment 1 Notify RN    GLUCOSE, CAPILLARY     Status: Abnormal   Collection Time    05/22/14  4:23 PM      Result Value Ref Range   Glucose-Capillary 133 (*) 70 - 99 mg/dL  GLUCOSE, CAPILLARY     Status: None   Collection Time    05/22/14  9:03 PM      Result Value Ref Range   Glucose-Capillary 95  70 - 99 mg/dL  GLUCOSE, CAPILLARY     Status: Abnormal   Collection Time    05/23/14  7:09 AM      Result Value Ref Range   Glucose-Capillary 108 (*) 70 - 99 mg/dL   Comment 1 Notify RN    GLUCOSE, CAPILLARY     Status: Abnormal   Collection Time   05/23/14 11:34 AM      Result Value Ref Range   Glucose-Capillary 140 (*) 70 - 99 mg/dL   Comment 1 Notify RN    GLUCOSE, CAPILLARY     Status: None   Collection Time    05/23/14  4:28 PM      Result Value Ref Range   Glucose-Capillary 88  70 - 99 mg/dL  GLUCOSE, CAPILLARY     Status: None   Collection Time    05/23/14  8:47 PM      Result Value Ref Range   Glucose-Capillary 92  70 - 99 mg/dL  GLUCOSE, CAPILLARY     Status: Abnormal   Collection Time    05/24/14  7:07 AM      Result Value Ref Range   Glucose-Capillary 103 (*) 70 - 99 mg/dL   Comment 1 Notify RN    GLUCOSE, CAPILLARY     Status: Abnormal   Collection Time    05/24/14 11:16 AM      Result Value Ref Range   Glucose-Capillary 132 (*) 70 - 99 mg/dL   Comment 1 Notify RN    GLUCOSE, CAPILLARY     Status: Abnormal   Collection Time    05/24/14  4:31 PM  Result Value Ref Range   Glucose-Capillary 120 (*) 70 - 99 mg/dL  GLUCOSE, CAPILLARY     Status: None   Collection Time    05/24/14  8:56 PM      Result Value Ref Range   Glucose-Capillary 84  70 - 99 mg/dL  GLUCOSE, CAPILLARY     Status: None   Collection Time    05/25/14  7:09 AM      Result Value Ref Range   Glucose-Capillary 98  70 - 99 mg/dL   Comment 1 Notify RN        Constitutional: He is oriented to person, place, and time. He appears well-developed and well-nourished.  HENT:  Head: Normocephalic and atraumatic.  Eyes: Conjunctivae are normal. Pupils are equal, round, and reactive to light.  Neck: Normal range of motion. Neck supple.  Cardiovascular: Normal rate and regular rhythm.  Murmur heard.  Respiratory: Effort normal. No accessory muscle usage. No respiratory distress. He has decreased breath sounds in the left lower field. He has no wheezes.  GI: Soft. Bowel sounds are normal. He exhibits no distension. There is no tenderness.  Musculoskeletal:  Left foot with dry wound, no drainage.  Neurological: He is alert and oriented to  person, place, and time.  Speech soft but clear. Follows commands without difficulty. BUE's 4/5 prox to distal. LLE 2/5 HF, KE, foot, RLE 3/5 HF, KE and foot. Decreased PP and LT in both feet and fingers.  Skin: Skin is warm and dry.  RLE graft incisions intact. Sutures out of abdomen   Assessment/Plan: 1. Functional deficits secondary to deconditioning after CABG which require 3+ hours per day of interdisciplinary therapy in a comprehensive inpatient rehab setting. Physiatrist is providing close team supervision and 24 hour management of active medical problems listed below. Physiatrist and rehab team continue to assess barriers to discharge/monitor patient progress toward functional and medical goals. FIM: FIM - Bathing Bathing Steps Patient Completed: Chest;Right Arm;Left Arm;Abdomen;Front perineal area;Buttocks;Right upper leg;Left upper leg;Right lower leg (including foot) Bathing: 6: Assistive device (Comment) (seated)  FIM - Upper Body Dressing/Undressing Upper body dressing/undressing steps patient completed: Thread/unthread right sleeve of pullover shirt/dresss;Put head through opening of pull over shirt/dress;Thread/unthread left sleeve of pullover shirt/dress;Pull shirt over trunk Upper body dressing/undressing: 7: Complete Independence: No helper FIM - Lower Body Dressing/Undressing Lower body dressing/undressing steps patient completed: Thread/unthread right pants leg;Thread/unthread left pants leg;Pull pants up/down;Don/Doff right sock;Don/Doff left sock;Don/Doff right shoe;Don/Doff left shoe;Fasten/unfasten right shoe;Fasten/unfasten left shoe Lower body dressing/undressing: 5: Supervision: Safety issues/verbal cues  FIM - Toileting Toileting steps completed by patient: Adjust clothing prior to toileting;Performs perineal hygiene;Adjust clothing after toileting Toileting Assistive Devices: Grab bar or rail for support Toileting: 5: Supervision: Safety issues/verbal cues  FIM  - Radio producer Devices: Bedside commode;Grab bars Toilet Transfers: 5-To toilet/BSC: Supervision (verbal cues/safety issues);5-From toilet/BSC: Supervision (verbal cues/safety issues)  FIM - Control and instrumentation engineer Devices: Copy: 6: Supine > Sit: No assist;5: Bed > Chair or W/C: Supervision (verbal cues/safety issues);6: Sit > Supine: No assist;5: Chair or W/C > Bed: Supervision (verbal cues/safety issues)  FIM - Locomotion: Wheelchair Distance: 225 Locomotion: Wheelchair: 0: Activity did not occur FIM - Locomotion: Ambulation Locomotion: Ambulation Assistive Devices: Administrator Ambulation/Gait Assistance: 5: Supervision Locomotion: Ambulation: 5: Travels 150 ft or more with supervision/safety issues  Comprehension Comprehension Mode: Auditory Comprehension: 6-Follows complex conversation/direction: With extra time/assistive device  Expression Expression Mode: Verbal Expression: 6-Expresses complex ideas: With extra time/assistive  device  Social Interaction Social Interaction: 6-Interacts appropriately with others with medication or extra time (anti-anxiety, antidepressant).  Problem Solving Problem Solving: 6-Solves complex problems: With extra time  Memory Memory: 5-Recognizes or recalls 90% of the time/requires cueing < 10% of the time  Medical Problem List and Plan:  1. Functional deficits secondary to Deconditioning after CABG and multiple medical  2. DVT Prophylaxis/Anticoagulation: Pharmaceutical: Lovenox  3. Pain Management: prn oxycodone is effective.  4. Mood: Has a supportive wife who is off for the summer. LCSW to follow for evaluation and support.  5. Neuropsych: This patient is capable of making decisions on his own behalf.  6. Fluid overload: Low salt diet.   Off lasix at this time. Appears  Negative still 7. DM type 2: amaryl with good control at present 8. PVD with multiple left  foot ulcers: Continue DARCO shoe for pressure relief.  9. ABLA: H/H with slow improvement.  Added iron supplement  10. Acute on CKD: Will continue to monitor renal status for now. Push po fluids. Avoid nephrotoxic medications  11. E Coli UTI/urine retention---cipro for 7 day course  -added flomax qhs for urine retention, pvr's better  -pt capable of self-cathing if needed---RN to review with pt today  -needs to be up to toilet to empty 12. CAD: Continue coreg, lipitor and ASA.  13. Constipation: improved  14.orthostatic hypotension--     -encouraging fluids  -continue coreg  -abd binder, teds helping  LOS (Days) 10 A FACE TO FACE EVALUATION WAS PERFORMED  SWARTZ,ZACHARY T 05/25/2014, 8:31 AM

## 2014-06-02 ENCOUNTER — Emergency Department: Payer: Self-pay | Admitting: Emergency Medicine

## 2014-06-04 ENCOUNTER — Other Ambulatory Visit: Payer: Self-pay | Admitting: Surgery

## 2014-06-04 DIAGNOSIS — I2510 Atherosclerotic heart disease of native coronary artery without angina pectoris: Secondary | ICD-10-CM

## 2014-06-06 ENCOUNTER — Ambulatory Visit (INDEPENDENT_AMBULATORY_CARE_PROVIDER_SITE_OTHER): Payer: BC Managed Care – PPO | Admitting: Surgery

## 2014-06-06 ENCOUNTER — Encounter: Payer: Self-pay | Admitting: Surgery

## 2014-06-06 ENCOUNTER — Ambulatory Visit: Payer: BC Managed Care – PPO | Admitting: Surgery

## 2014-06-06 ENCOUNTER — Ambulatory Visit
Admission: RE | Admit: 2014-06-06 | Discharge: 2014-06-06 | Disposition: A | Discharge status: Home / Self Care | Payer: BC Managed Care – PPO | Source: Ambulatory Visit | Attending: Surgery | Admitting: Surgery

## 2014-06-06 VITALS — BP 90/63 | HR 90 | Resp 16 | Ht 73.0 in | Wt 225.0 lb

## 2014-06-06 DIAGNOSIS — I2510 Atherosclerotic heart disease of native coronary artery without angina pectoris: Secondary | ICD-10-CM

## 2014-06-06 DIAGNOSIS — Z951 Presence of aortocoronary bypass graft: Secondary | ICD-10-CM

## 2014-06-06 DIAGNOSIS — I2584 Coronary atherosclerosis due to calcified coronary lesion: Secondary | ICD-10-CM

## 2014-06-06 NOTE — Progress Notes (Signed)
HPI:  Patient returns for routine postoperative follow-up having undergone CABG x 4 on 05/07/2014. The patient's early postoperative recovery while in the hospital was notable for a very slow postop recovery due to preoperative debilitation. He was transferred to inpatient rehab at [facility]. Since hospital discharge the patient reports that he has been slowly improving. He has still had some orthostatic hypotension with dizziness when standing. He fell recently while leaving the mall and hit his head.   Current Outpatient Prescriptions  Medication Sig Dispense Refill  . acetaminophen (TYLENOL) 325 MG tablet Take 1-2 tablets (325-650 mg total) by mouth every 4 (four) hours as needed for mild pain.      Marland Kitchen aspirin EC 325 MG EC tablet Take 1 tablet (325 mg total) by mouth daily.  30 tablet  0  . atorvastatin (LIPITOR) 40 MG tablet Take 1 tablet (40 mg total) by mouth daily at 6 PM.      . atorvastatin (LIPITOR) 40 MG tablet Take 1 tablet (40 mg total) by mouth daily at 6 PM.  30 tablet  1  . carvedilol (COREG) 3.125 MG tablet Take 1 tablet (3.125 mg total) by mouth 2 (two) times daily with a meal.      . glimepiride (AMARYL) 2 MG tablet Take 1 tablet (2 mg total) by mouth daily with breakfast.  30 tablet  1  . insulin aspart (NOVOLOG FLEXPEN) 100 UNIT/ML FlexPen Inject 4-8 Units into the skin See admin instructions. Sliding scale      . iron polysaccharides (NIFEREX) 150 MG capsule Take 1 capsule (150 mg total) by mouth 2 times daily at 12 noon and 4 pm. For anemia  60 capsule  1  . oxyCODONE (OXY IR/ROXICODONE) 5 MG immediate release tablet Take 1-2 tablets (5-10 mg total) by mouth every 3 (three) hours as needed for severe pain.  30 tablet  0  . pantoprazole (PROTONIX) 40 MG tablet Take 1 tablet (40 mg total) by mouth daily before breakfast. May stop when discharged from CIR.      Marland Kitchen senna-docusate (SENOKOT-S) 8.6-50 MG per tablet Take 2 tablets by mouth at bedtime. For constipation  100  tablet  1  . tamsulosin (FLOMAX) 0.4 MG CAPS capsule Take 1 capsule (0.4 mg total) by mouth daily after supper.  30 capsule  1   No current facility-administered medications for this visit.    Physical Exam: BP 90/63  Pulse 90  Resp 16  Ht 6\' 1"  (1.854 m)  Wt 225 lb (102.059 kg)  BMI 29.69 kg/m2  SpO2 98% He looks well. Lung exam is clear. Cardiac exam shows a regular rate and rhythm with normal heart sounds. Chest incision is healing well and sternum is stable. The leg incisions are healing well and there is no peripheral edema.  Diagnostic Tests:  CLINICAL DATA: Recent heart surgery. Shortness of breath.  EXAM:  CHEST 2 VIEW  COMPARISON: 05/18/2014.  FINDINGS:  Prior median sternotomy. Cardiomegaly. Normal pulmonary vascularity.  No focal infiltrate. No pleural effusion or pneumothorax. No acute  bony abnormality.  IMPRESSION:  1. Prior median sternotomy. Cardiomegaly. Normal pulmonary  vascularity.  2. No acute cardiopulmonary disease. Chest is stable from  05/18/2014.  Electronically Signed  By: Marcello Moores Register  On: 06/06/2014 11:22   Impression:  Overall I think he is making a slow but satisfactory recovery from his surgery. He looked chronically ill and debilitated when I saw him initially. Hopefully with continued rehab he will return to  a functional state. His BP is a little low today in the office but his wife says that his BP is usually in the 140-150 range. It does drop when he stands up. He is only on a small dose of Coreg but I think he should hold this for now.  Plan:  He is going to follow up with his primary physician, Dr. Ginette Pitman and is going to make an appointment to see Dr. Rockey Situ for cardiology follow up. He was initially seen by Dr. Chancy Milroy but says that he is going to make a change. He will return to see me if he develops any problems with his incisions.

## 2014-06-08 DIAGNOSIS — I2510 Atherosclerotic heart disease of native coronary artery without angina pectoris: Secondary | ICD-10-CM | POA: Diagnosis not present

## 2014-06-08 DIAGNOSIS — Z48812 Encounter for surgical aftercare following surgery on the circulatory system: Secondary | ICD-10-CM | POA: Diagnosis not present

## 2014-06-08 DIAGNOSIS — R32 Unspecified urinary incontinence: Secondary | ICD-10-CM

## 2014-06-08 DIAGNOSIS — I10 Essential (primary) hypertension: Secondary | ICD-10-CM

## 2014-06-08 DIAGNOSIS — E1159 Type 2 diabetes mellitus with other circulatory complications: Secondary | ICD-10-CM | POA: Diagnosis not present

## 2014-06-08 DIAGNOSIS — L97409 Non-pressure chronic ulcer of unspecified heel and midfoot with unspecified severity: Secondary | ICD-10-CM | POA: Diagnosis not present

## 2014-06-08 DIAGNOSIS — E1149 Type 2 diabetes mellitus with other diabetic neurological complication: Secondary | ICD-10-CM

## 2014-06-08 DIAGNOSIS — G609 Hereditary and idiopathic neuropathy, unspecified: Secondary | ICD-10-CM

## 2014-06-08 DIAGNOSIS — R413 Other amnesia: Secondary | ICD-10-CM

## 2014-06-08 DIAGNOSIS — Z951 Presence of aortocoronary bypass graft: Secondary | ICD-10-CM

## 2014-06-15 ENCOUNTER — Encounter: Payer: Self-pay | Admitting: Cardiovascular Disease

## 2014-06-15 ENCOUNTER — Ambulatory Visit (INDEPENDENT_AMBULATORY_CARE_PROVIDER_SITE_OTHER): Payer: BC Managed Care – PPO | Admitting: Cardiovascular Disease

## 2014-06-15 VITALS — BP 112/82 | HR 94 | Ht 73.0 in | Wt 227.0 lb

## 2014-06-15 DIAGNOSIS — E1165 Type 2 diabetes mellitus with hyperglycemia: Secondary | ICD-10-CM

## 2014-06-15 DIAGNOSIS — I2510 Atherosclerotic heart disease of native coronary artery without angina pectoris: Secondary | ICD-10-CM

## 2014-06-15 DIAGNOSIS — IMO0001 Reserved for inherently not codable concepts without codable children: Secondary | ICD-10-CM

## 2014-06-15 DIAGNOSIS — Z951 Presence of aortocoronary bypass graft: Secondary | ICD-10-CM

## 2014-06-15 DIAGNOSIS — E1169 Type 2 diabetes mellitus with other specified complication: Secondary | ICD-10-CM

## 2014-06-15 DIAGNOSIS — I209 Angina pectoris, unspecified: Secondary | ICD-10-CM

## 2014-06-15 DIAGNOSIS — N189 Chronic kidney disease, unspecified: Secondary | ICD-10-CM

## 2014-06-15 DIAGNOSIS — I951 Orthostatic hypotension: Secondary | ICD-10-CM

## 2014-06-15 DIAGNOSIS — E11621 Type 2 diabetes mellitus with foot ulcer: Secondary | ICD-10-CM

## 2014-06-15 DIAGNOSIS — I25118 Atherosclerotic heart disease of native coronary artery with other forms of angina pectoris: Secondary | ICD-10-CM

## 2014-06-15 DIAGNOSIS — R0789 Other chest pain: Secondary | ICD-10-CM

## 2014-06-15 DIAGNOSIS — L97509 Non-pressure chronic ulcer of other part of unspecified foot with unspecified severity: Secondary | ICD-10-CM

## 2014-06-15 DIAGNOSIS — L97529 Non-pressure chronic ulcer of other part of left foot with unspecified severity: Secondary | ICD-10-CM

## 2014-06-15 MED ORDER — FLUDROCORTISONE ACETATE 0.1 MG PO TABS: 0.1000 mg | ORAL_TABLET | Freq: Every day | ORAL | Status: DC

## 2014-06-15 NOTE — Assessment & Plan Note (Signed)
Severe three-vessel disease, left main disease. We'll need to continue aggressive cholesterol management

## 2014-06-15 NOTE — Assessment & Plan Note (Signed)
Recovered well from his bypass surgery 05/07/2013. Recently seen by Dr. Cyndia Bent. No recent symptoms of chest pain apart from musculoskeletal discomfort from recent fall 2 weeks ago

## 2014-06-15 NOTE — Assessment & Plan Note (Addendum)
Significant orthostatic hypotension in today's visit. Supine blood pressure 144/90, sitting 113/82, standing 90/64 with climb in his heart rate from 97 up to 116. Blood pressure did not recover with standing after several minutes.  We have talked to him about the various treatment options. He does still spend significant time in bed supine and because of this we will not start midodrine. We will start Florinef to avoid spiking blood pressures. 0.1 mg daily. Also suggested he hold the Cialis and the Flomax. We have suggested he closely monitor his blood pressure at home when sitting and standing and contact our office with numbers in the next week or 2

## 2014-06-15 NOTE — Progress Notes (Signed)
Patient ID: [name] [name], male    DOB: February 22, 1961, 53 y.o.   MRN: [id number]  HPI Comments: [name] [name] is a 53 year old gentleman with history of diabetes, hyperlipidemia, coronary artery disease with bypass surgery x46 weeks ago at the end of June 2015 with Dr. Cyndia Bent, with prior history of orthostatic hypotension who presents to establish care in the [facility] office. He reports history in October 2014 of MRSA, requiring amputation of several of his toes, severe renal dysfunction at that time felt secondary to antibiotics with subsequent improvement of his renal function.  Prior echocardiogram September 2014 showing ejection fraction less than 20% Positive stress test at that time he was unable to undergo cardiac catheterization secondary to renal dysfunction  He reports having recent falls, dizziness. 2 weeks ago fell, hit his ribs, as well as his head. He continues to have bilateral lower rib pain which she attributes to prior trauma from the fall. Does not seem to have pain around the central mediastinal incision site, only around the lower ribs bilaterally.  Reports having low blood pressures for quite some time. He is learning to rise to a standing position very slowly. Report systolic pressures in the 70 and 80 range a regular basis.  recently seen by urology for trouble voiding while he was in the hospital, started on Flomax, recently started on Cialis 2 days ago 5 mg daily.  Reports seeing bright light when he gets outside in the sun. [name]. He feels that this is also contributing to his falls. Does not seem to wear sunglasses  Carvedilol was recently held by Dr. Cyndia Bent for hypotension  Prior cardiac catheterization may 2015 showing 70% left main disease, 70% diagonal #2 disease, 80% proximal circumflex disease, 80% mid circumflex disease, 99% proximal RCA disease, 99% PL branch disease, ejection fraction 40%  EKG today showed normal sinus rhythm with  rate 94 beats per minute with poor R wave program precordial leads, old inferior MI   Outpatient Encounter Prescriptions as of 06/15/2014  Medication Sig  . acetaminophen (TYLENOL) 325 MG tablet Take 1-2 tablets (325-650 mg total) by mouth every 4 (four) hours as needed for mild pain.  Marland Kitchen aspirin EC 325 MG EC tablet Take 1 tablet (325 mg total) by mouth daily.  Marland Kitchen atorvastatin (LIPITOR) 40 MG tablet Take 1 tablet (40 mg total) by mouth daily at 6 PM.  . Ferrous Sulfate (IRON) 325 (65 FE) MG TABS Take two tablets twice a day.  Marland Kitchen glimepiride (AMARYL) 2 MG tablet Take 1 tablet (2 mg total) by mouth daily with breakfast.  . insulin aspart (NOVOLOG FLEXPEN) 100 UNIT/ML FlexPen Inject 4-8 Units into the skin See admin instructions. Sliding scale  . senna-docusate (SENOKOT-S) 8.6-50 MG per tablet Take 2 tablets by mouth at bedtime. For constipation  . tadalafil (CIALIS) 5 MG tablet Take 5 mg by mouth daily.  . tamsulosin (FLOMAX) 0.4 MG CAPS capsule Take 1 capsule (0.4 mg total) by mouth daily after supper.  . zolpidem (AMBIEN) 5 MG tablet Take 5 mg by mouth at bedtime as needed for sleep.    Review of Systems  HENT: Negative.   Eyes: Negative.   Respiratory: Negative.   Cardiovascular: Positive for chest pain.  Gastrointestinal: Negative.   Endocrine: Negative.   Musculoskeletal: Negative.   Skin: Negative.   Allergic/Immunologic: Negative.   Neurological: Positive for dizziness and weakness.  Hematological: Negative.   Psychiatric/Behavioral: Negative.   All other systems reviewed and are negative.  BP 112/82  Pulse 94  Ht 6\' 1"  (1.854 m)  Wt 227 lb (102.967 kg)  BMI 29.96 kg/m2  Physical Exam  Nursing note and vitals reviewed. Constitutional: He is oriented to person, place, and time. He appears well-developed and well-nourished.  HENT:  Head: Normocephalic.  Nose: Nose normal.  Mouth/Throat: Oropharynx is clear and moist.  Eyes: Conjunctivae are normal. Pupils are equal,  round, and reactive to light.  Neck: Normal range of motion. Neck supple. No JVD present.  Cardiovascular: Normal rate, regular rhythm, S1 normal, S2 normal, normal heart sounds and intact distal pulses.  Exam reveals no gallop and no friction rub.   No murmur heard. Pulmonary/Chest: Effort normal and breath sounds normal. No respiratory distress. He has no wheezes. He has no rales. He exhibits no tenderness.  Abdominal: Soft. Bowel sounds are normal. He exhibits no distension. There is no tenderness.  Musculoskeletal: Normal range of motion. He exhibits no edema and no tenderness.  Lymphadenopathy:    He has no cervical adenopathy.  Neurological: He is alert and oriented to person, place, and time. Coordination normal.  Skin: Skin is warm and dry. No rash noted. No erythema.  Psychiatric: He has a normal mood and affect. His behavior is normal. Judgment and thought content normal.      Assessment and Plan

## 2014-06-15 NOTE — Assessment & Plan Note (Signed)
Renal function is improved back to his baseline

## 2014-06-15 NOTE — Patient Instructions (Addendum)
You are doing well.  Please stop the cialis  Consider holding the flomax  Start florinef one a day Monitor your blood pressure at home  Please call us if you have new issues that need to be addressed before your next appt.  Your physician wants you to follow-up in: 2 weeks

## 2014-06-15 NOTE — Assessment & Plan Note (Signed)
We have encouraged continued exercise, careful diet management in an effort to lose weight. 

## 2014-06-15 NOTE — Assessment & Plan Note (Signed)
Prior amputation.

## 2014-06-22 ENCOUNTER — Telehealth: Payer: Self-pay

## 2014-06-22 NOTE — Telephone Encounter (Signed)
Pt states medication Florinef is not helping. BP is "still doing the same thing"

## 2014-06-22 NOTE — Telephone Encounter (Signed)
Spoke w/ pt.  He reports that his blood pressure is continuing to drop when he stands up.  Reports BP sitting 135/89, 90/62 on standing.  He would like to know if he should increase his florinef.  He has not increased the amount of salt in his diet but much. Please advise.  Thank you.

## 2014-06-24 NOTE — Telephone Encounter (Signed)
I think he is taking florinef 0.1 mg daily  Would start midodrine 5 mg TID Would call in 10 mg tid #90, refills 6 Cut in 1/2 to start  Stay on florinef 0.1 daily

## 2014-06-25 MED ORDER — MIDODRINE HCL 10 MG PO TABS: 10.0000 mg | ORAL_TABLET | Freq: Three times a day (TID) | ORAL | Status: DC

## 2014-06-25 NOTE — Telephone Encounter (Signed)
Spoke w/ pt.  Advised him of Dr. Donivan Scull recommendation.  He verbalizes understanding and will continue to monitor BP and call w/ further questions or concerns.

## 2014-06-29 ENCOUNTER — Ambulatory Visit (INDEPENDENT_AMBULATORY_CARE_PROVIDER_SITE_OTHER): Payer: BC Managed Care – PPO | Admitting: Cardiovascular Disease

## 2014-06-29 ENCOUNTER — Encounter: Payer: Self-pay | Admitting: Cardiovascular Disease

## 2014-06-29 VITALS — BP 150/82 | HR 79 | Ht 73.0 in | Wt 227.2 lb

## 2014-06-29 DIAGNOSIS — R000 Tachycardia, unspecified: Secondary | ICD-10-CM

## 2014-06-29 DIAGNOSIS — I209 Angina pectoris, unspecified: Secondary | ICD-10-CM

## 2014-06-29 DIAGNOSIS — Z951 Presence of aortocoronary bypass graft: Secondary | ICD-10-CM

## 2014-06-29 DIAGNOSIS — I25708 Atherosclerosis of coronary artery bypass graft(s), unspecified, with other forms of angina pectoris: Secondary | ICD-10-CM

## 2014-06-29 DIAGNOSIS — R5381 Other malaise: Secondary | ICD-10-CM

## 2014-06-29 DIAGNOSIS — I951 Orthostatic hypotension: Secondary | ICD-10-CM

## 2014-06-29 DIAGNOSIS — I25810 Atherosclerosis of coronary artery bypass graft(s) without angina pectoris: Secondary | ICD-10-CM

## 2014-06-29 DIAGNOSIS — IMO0001 Reserved for inherently not codable concepts without codable children: Secondary | ICD-10-CM

## 2014-06-29 DIAGNOSIS — E1165 Type 2 diabetes mellitus with hyperglycemia: Secondary | ICD-10-CM

## 2014-06-29 NOTE — Patient Instructions (Addendum)
Please take midodrine at 9 to 10 AM and 3 to 4 pm Ok to hold the 11 pm pill Stay on florinef one a day  If blood pressure tolerates, Start coreg 3.125 mg in the AM If blod pressure drops, You could increase the midodrine up to 10 mg (whole pill)  Your appt for Cardiac Rehab Orientation is Tuesday, Aug 18 @ 10:00 Please call [phone] if you are unable to keep this appt  Please call us if you have new issues that need to be addressed before your next appt.  Your physician wants you to follow-up in: 1 month.

## 2014-06-29 NOTE — Assessment & Plan Note (Signed)
He continues to have significant orthostasis. With Florinef 0.1 mg daily and midodrine 5 mg 3 times a day, blood pressure has significantly improved, still with a major drop with standing. Suggested he only take midodrine 5 mg at 9 AM and 3 PM, hold the midodrine at 11 PM that he has been taking.

## 2014-06-29 NOTE — Assessment & Plan Note (Signed)
We have encouraged continued exercise, careful diet management in an effort to lose weight. 

## 2014-06-29 NOTE — Progress Notes (Signed)
Patient ID: [name] [name], male    DOB: 05-Mar-1961, 53 y.o.   MRN: [id number]  HPI Comments: [name] [name] is a 53 year old gentleman with history of diabetes, hyperlipidemia, coronary artery disease with bypass surgery x4 at the end of June 2015 with Dr. Cyndia Bent, with prior history of orthostatic hypotension who presents to establish care in the [facility] office. He reports history in October 2014 of MRSA, requiring amputation of several of his toes, severe renal dysfunction at that time felt secondary to antibiotics with subsequent improvement of his renal function. Prior echocardiogram September 2014 showing ejection fraction less than 20% Positive stress test at that time he was unable to undergo cardiac catheterization secondary to renal dysfunction  On his last clinic visit, he has severe orthostatic hypotension Is unable to complete or start his cardiac rehabilitation secondary to dizziness. Carvedilol previously held for hypotension He was started on Florinef 0.1 mg daily. Continued to have orthostatic symptoms and was started on midodrine 5 mg 3 times a day On this combination, he has had improvement of his symptoms, now with no lightheadedness or dizziness.  Blood pressure typically runs in the [id number] systolic range sometimes higher when supine. Rare systolic pressures in the 90s with standing, typically 100  Orthostatic in the office today shows supine pressure 162/97 with heart rate 80, sitting 116/77 with heart rate 93, standing 119/77 with heart rate 102, 3 minutes standing 100/67 with heart rate 114.  [name], would like to start cardiac rehabilitation. He still spends some of his daytime relaxing in a supine position Cialis in Flomax previously held for hypotension  Reports seeing bright light when he gets outside in the sun. [name]. He feels that this is also contributing to his falls. Does not seem to wear sunglasses  Prior  cardiac catheterization may 2015 showing 70% left main disease, 70% diagonal #2 disease, 80% proximal circumflex disease, 80% mid circumflex disease, 99% proximal RCA disease, 99% PL branch disease, ejection fraction 40%  EKG today showed normal sinus rhythm with rate 79 beats per minute with poor R wave program precordial leads, old inferior MI   Outpatient Encounter Prescriptions as of 06/29/2014  Medication Sig  . acetaminophen (TYLENOL) 325 MG tablet Take 1-2 tablets (325-650 mg total) by mouth every 4 (four) hours as needed for mild pain.  Marland Kitchen aspirin EC 325 MG EC tablet Take 1 tablet (325 mg total) by mouth daily.  Marland Kitchen atorvastatin (LIPITOR) 40 MG tablet Take 1 tablet (40 mg total) by mouth daily at 6 PM.  . Ferrous Sulfate (IRON) 325 (65 FE) MG TABS Take two tablets twice a day.  . fludrocortisone (FLORINEF) 0.1 MG tablet Take 1 tablet (0.1 mg total) by mouth daily.  Marland Kitchen glimepiride (AMARYL) 2 MG tablet Take 1 tablet (2 mg total) by mouth daily with breakfast.  . insulin aspart (NOVOLOG FLEXPEN) 100 UNIT/ML FlexPen Inject 4-8 Units into the skin See admin instructions. Sliding scale  . midodrine (PROAMATINE) 10 MG tablet Take 1 tablet (10 mg total) by mouth 3 (three) times daily.  Marland Kitchen senna-docusate (SENOKOT-S) 8.6-50 MG per tablet Take 2 tablets by mouth at bedtime. For constipation  . tadalafil (CIALIS) 5 MG tablet Take 5 mg by mouth daily.  . tamsulosin (FLOMAX) 0.4 MG CAPS capsule Take 1 capsule (0.4 mg total) by mouth daily after supper.  . zolpidem (AMBIEN) 5 MG tablet Take 5 mg by mouth at bedtime as needed for sleep.  Review of Systems  HENT: Negative.   Eyes: Negative.   Respiratory: Negative.   Cardiovascular: Negative.   Gastrointestinal: Negative.   Endocrine: Negative.   Musculoskeletal: Negative.   Skin: Negative.   Allergic/Immunologic: Negative.   Neurological: Positive for weakness.  Hematological: Negative.   Psychiatric/Behavioral: Negative.   All other systems  reviewed and are negative.   BP 150/82  Ht 6\' 1"  (1.854 m)  Wt 227 lb 4 oz (103.08 kg)  BMI 29.99 kg/m2  Physical Exam  Nursing note and vitals reviewed. Constitutional: He is oriented to person, place, and time. He appears well-developed and well-nourished.  HENT:  Head: Normocephalic.  Nose: Nose normal.  Mouth/Throat: Oropharynx is clear and moist.  Eyes: Conjunctivae are normal. Pupils are equal, round, and reactive to light.  Neck: Normal range of motion. Neck supple. No JVD present.  Cardiovascular: Normal rate, regular rhythm, S1 normal, S2 normal, normal heart sounds and intact distal pulses.  Exam reveals no gallop and no friction rub.   No murmur heard. Pulmonary/Chest: Effort normal and breath sounds normal. No respiratory distress. He has no wheezes. He has no rales. He exhibits no tenderness.  Abdominal: Soft. Bowel sounds are normal. He exhibits no distension. There is no tenderness.  Musculoskeletal: Normal range of motion. He exhibits no edema and no tenderness.  Lymphadenopathy:    He has no cervical adenopathy.  Neurological: He is alert and oriented to person, place, and time. Coordination normal.  Skin: Skin is warm and dry. No rash noted. No erythema.  Psychiatric: He has a normal mood and affect. His behavior is normal. Judgment and thought content normal.      Assessment and Plan

## 2014-06-29 NOTE — Assessment & Plan Note (Signed)
Currently with no symptoms of angina. We'll start Coreg 3.125 mg one pill in the morning if tolerated

## 2014-06-29 NOTE — Assessment & Plan Note (Signed)
Currently with no symptoms of angina. No further workup at this time. Continue current medication regimen. 

## 2014-06-29 NOTE — Assessment & Plan Note (Signed)
We will enroll him back in a cardiac rehabilitation now that blood pressure is adequate

## 2014-06-30 LAB — BASIC METABOLIC PANEL
BUN / CREAT RATIO: 17 (ref 9–20)
BUN: 21 mg/dL (ref 6–24)
CO2: 23 mmol/L (ref 18–29)
CREATININE: 1.24 mg/dL (ref 0.76–1.27)
Calcium: 9.3 mg/dL (ref 8.7–10.2)
Chloride: 103 mmol/L (ref 97–108)
GFR calc Af Amer: 77 mL/min/{1.73_m2} (ref >59)
GFR calc non Af Amer: 67 mL/min/{1.73_m2} (ref >59)
Glucose: 184 mg/dL — ABNORMAL HIGH (ref 65–99)
Potassium: 5 mmol/L (ref 3.5–5.2)
Sodium: 140 mmol/L (ref 134–144)

## 2014-07-03 ENCOUNTER — Encounter: Payer: Self-pay | Admitting: Cardiovascular Disease

## 2014-07-17 ENCOUNTER — Encounter: Payer: Self-pay | Admitting: Cardiovascular Disease

## 2014-07-21 ENCOUNTER — Other Ambulatory Visit (HOSPITAL_COMMUNITY): Payer: Self-pay | Admitting: Physical Medicine and Rehabilitation

## 2014-07-31 ENCOUNTER — Ambulatory Visit (INDEPENDENT_AMBULATORY_CARE_PROVIDER_SITE_OTHER): Payer: BC Managed Care – PPO | Admitting: Cardiovascular Disease

## 2014-07-31 ENCOUNTER — Encounter: Payer: Self-pay | Admitting: Cardiovascular Disease

## 2014-07-31 VITALS — BP 128/88 | HR 80 | Ht 73.0 in | Wt 230.5 lb

## 2014-07-31 DIAGNOSIS — IMO0001 Reserved for inherently not codable concepts without codable children: Secondary | ICD-10-CM

## 2014-07-31 DIAGNOSIS — I209 Angina pectoris, unspecified: Secondary | ICD-10-CM

## 2014-07-31 DIAGNOSIS — E1165 Type 2 diabetes mellitus with hyperglycemia: Secondary | ICD-10-CM

## 2014-07-31 DIAGNOSIS — Z951 Presence of aortocoronary bypass graft: Secondary | ICD-10-CM

## 2014-07-31 DIAGNOSIS — I25810 Atherosclerosis of coronary artery bypass graft(s) without angina pectoris: Secondary | ICD-10-CM

## 2014-07-31 DIAGNOSIS — E11621 Type 2 diabetes mellitus with foot ulcer: Secondary | ICD-10-CM

## 2014-07-31 DIAGNOSIS — E1169 Type 2 diabetes mellitus with other specified complication: Secondary | ICD-10-CM

## 2014-07-31 DIAGNOSIS — I25708 Atherosclerosis of coronary artery bypass graft(s), unspecified, with other forms of angina pectoris: Secondary | ICD-10-CM

## 2014-07-31 DIAGNOSIS — I428 Other cardiomyopathies: Secondary | ICD-10-CM

## 2014-07-31 DIAGNOSIS — L97509 Non-pressure chronic ulcer of other part of unspecified foot with unspecified severity: Secondary | ICD-10-CM

## 2014-07-31 DIAGNOSIS — I420 Dilated cardiomyopathy: Secondary | ICD-10-CM

## 2014-07-31 DIAGNOSIS — L97529 Non-pressure chronic ulcer of other part of left foot with unspecified severity: Secondary | ICD-10-CM

## 2014-07-31 DIAGNOSIS — I951 Orthostatic hypotension: Secondary | ICD-10-CM

## 2014-07-31 DIAGNOSIS — E785 Hyperlipidemia, unspecified: Secondary | ICD-10-CM | POA: Insufficient documentation

## 2014-07-31 NOTE — Assessment & Plan Note (Signed)
Currently with no symptoms of angina. No further workup at this time. Continue current medication regimen. 

## 2014-07-31 NOTE — Patient Instructions (Addendum)
You are doing well. No medication changes were made.  We will schedule an echocardiogram for hx of cardiomyopathy  Please call us if you have new issues that need to be addressed before your next appt.  Your physician wants you to follow-up in: 6 months.  You will receive a reminder letter in the mail two months in advance. If you don't receive a letter, please call our office to schedule the follow-up appointment.

## 2014-07-31 NOTE — Assessment & Plan Note (Signed)
Encouraged him to stay on his Lipitor. Goal LDL less than 70 

## 2014-07-31 NOTE — Assessment & Plan Note (Signed)
Followed by podiatry. Recommended strict diabetes control.

## 2014-07-31 NOTE — Progress Notes (Signed)
Patient ID: [name] [name], male    DOB: December 16, 1960, 53 y.o.   MRN: [id number]  HPI Comments: [name] is a 53 year old gentleman with history of diabetes, hyperlipidemia, coronary artery disease with bypass surgery x4 at the end of June 2015 with Dr. Cyndia Bent, with prior history of orthostatic hypotension who presents to establish care in the [facility] office. He reports history in October 2014 of MRSA, requiring amputation of several of his toes, severe renal dysfunction at that time felt secondary to antibiotics with subsequent improvement of his renal function. Prior echocardiogram September 2014 showing ejection fraction less than 20% Positive stress test at that time he was unable to undergo cardiac catheterization secondary to renal dysfunction  In followup today, he reports that he has stopped his midodrine and Florinef. He denies any lightheadedness or dizziness. He is participating in cardiac rehabilitation 3 days per week for the past several weeks. He feels stronger. He was concerned the midodrine was causing leg discomfort. Legs are feeling better with rehabilitation. He has continued also on his left foot, still wears a boot. Denies having any significant shortness of breath, chest pain or leg edema. No orthopnea or PND. Blood pressure has been running higher when supine. He does report taking carvedilol at nighttime  Orthostatics in the office today shows supine blood pressure 166/95 on the setting 143/89, standing 125/87, after 3 minutes of standing 115/80, heart rate up from 80 up to 102 with standing  On prior clinic visits he had severe orthostatic hypotension  Orthostatic in the office today shows supine pressure 162/97 with heart rate 80, sitting 116/77 with heart rate 93, standing 119/77 with heart rate 102, 3 minutes standing 100/67 with heart rate 114.  Cialis and Flomax previously held for hypotension  Prior cardiac catheterization may 2015 showing 70% left main  disease, 70% diagonal #2 disease, 80% proximal circumflex disease, 80% mid circumflex disease, 99% proximal RCA disease, 99% PL branch disease, ejection fraction 40%   Outpatient Encounter Prescriptions as of 07/31/2014  Medication Sig  . acetaminophen (TYLENOL) 325 MG tablet Take 1-2 tablets (325-650 mg total) by mouth every 4 (four) hours as needed for mild pain.  Marland Kitchen aspirin EC 325 MG EC tablet Take 1 tablet (325 mg total) by mouth daily.  Marland Kitchen atorvastatin (LIPITOR) 40 MG tablet Take 1 tablet (40 mg total) by mouth daily at 6 PM.  . Ferrous Sulfate (IRON) 325 (65 FE) MG TABS Take two tablets twice a day.  Marland Kitchen glimepiride (AMARYL) 2 MG tablet Take 1 tablet (2 mg total) by mouth daily with breakfast.  . insulin aspart (NOVOLOG FLEXPEN) 100 UNIT/ML FlexPen Inject 4-8 Units into the skin See admin instructions. Sliding scale  . senna-docusate (SENOKOT-S) 8.6-50 MG per tablet Take 2 tablets by mouth at bedtime. For constipation  . tadalafil (CIALIS) 5 MG tablet Take 5 mg by mouth daily.  . tamsulosin (FLOMAX) 0.4 MG CAPS capsule Take 1 capsule (0.4 mg total) by mouth daily after supper.  . zolpidem (AMBIEN) 5 MG tablet Take 5 mg by mouth at bedtime as needed for sleep.    Review of Systems  Constitutional: Negative.   HENT: Negative.   Eyes: Negative.   Respiratory: Negative.   Cardiovascular: Negative.   Gastrointestinal: Negative.   Endocrine: Negative.   Musculoskeletal: Negative.   Skin: Negative.   Allergic/Immunologic: Negative.   Neurological: Negative.   Hematological: Negative.   Psychiatric/Behavioral: Negative.   All other systems reviewed and are negative.   BP 128/88  Pulse 80  Ht 6\' 1"  (1.854 m)  Wt 230 lb 8 oz (104.554 kg)  BMI 30.42 kg/m2  Physical Exam  Nursing note and vitals reviewed. Constitutional: He is oriented to person, place, and time. He appears well-developed and well-nourished.  HENT:  Head: Normocephalic.  Nose: Nose normal.  Mouth/Throat: Oropharynx  is clear and moist.  Eyes: Conjunctivae are normal. Pupils are equal, round, and reactive to light.  Neck: Normal range of motion. Neck supple. No JVD present.  Cardiovascular: Normal rate, regular rhythm, S1 normal, S2 normal, normal heart sounds and intact distal pulses.  Exam reveals no gallop and no friction rub.   No murmur heard. Pulmonary/Chest: Effort normal and breath sounds normal. No respiratory distress. He has no wheezes. He has no rales. He exhibits no tenderness.  Abdominal: Soft. Bowel sounds are normal. He exhibits no distension. There is no tenderness.  Musculoskeletal: Normal range of motion. He exhibits no edema and no tenderness.  Lymphadenopathy:    He has no cervical adenopathy.  Neurological: He is alert and oriented to person, place, and time. Coordination normal.  Skin: Skin is warm and dry. No rash noted. No erythema.  Psychiatric: He has a normal mood and affect. His behavior is normal. Judgment and thought content normal.      Assessment and Plan

## 2014-07-31 NOTE — Assessment & Plan Note (Signed)
He continues to have tenderness around the drain sites in his upper epigastric area. Otherwise doing well at rehabilitation

## 2014-07-31 NOTE — Assessment & Plan Note (Signed)
Blood pressure continues to show signs of orthostasis, though he is asymptomatic. He stopped midodrine and Florinef on his own. Suggested he continue to monitor his pressure, restart midodrine as needed for orthostatic symptoms. Encouraged fluid and salt intake

## 2014-07-31 NOTE — Assessment & Plan Note (Signed)
We have encouraged continued exercise, careful diet management in an effort to lose weight. 

## 2014-08-02 ENCOUNTER — Other Ambulatory Visit (INDEPENDENT_AMBULATORY_CARE_PROVIDER_SITE_OTHER): Payer: BC Managed Care – PPO

## 2014-08-02 ENCOUNTER — Other Ambulatory Visit: Payer: Self-pay

## 2014-08-02 DIAGNOSIS — I420 Dilated cardiomyopathy: Secondary | ICD-10-CM

## 2014-08-02 DIAGNOSIS — Z951 Presence of aortocoronary bypass graft: Secondary | ICD-10-CM

## 2014-08-02 DIAGNOSIS — I428 Other cardiomyopathies: Secondary | ICD-10-CM

## 2014-08-02 DIAGNOSIS — I2510 Atherosclerotic heart disease of native coronary artery without angina pectoris: Secondary | ICD-10-CM

## 2014-08-16 ENCOUNTER — Encounter: Payer: Self-pay | Admitting: Cardiovascular Disease

## 2014-08-21 ENCOUNTER — Ambulatory Visit: Payer: Self-pay

## 2014-08-21 LAB — CBC WITH DIFFERENTIAL/PLATELET
BASOS ABS: 0.1 10*3/uL (ref 0.0–0.1)
Basophil %: 0.5 %
Eosinophil #: 0.1 10*3/uL (ref 0.0–0.7)
Eosinophil %: 1.3 %
HCT: 30.8 % — ABNORMAL LOW (ref 40.0–52.0)
HGB: 9.8 g/dL — ABNORMAL LOW (ref 13.0–18.0)
Lymphocyte #: 2.3 10*3/uL (ref 1.0–3.6)
Lymphocyte %: 22.5 %
MCH: 28.6 pg (ref 26.0–34.0)
MCHC: 31.8 g/dL — ABNORMAL LOW (ref 32.0–36.0)
MCV: 90 fL (ref 80–100)
Monocyte #: 1 x10 3/mm (ref 0.2–1.0)
Monocyte %: 9.8 %
NEUTROS ABS: 6.8 10*3/uL — AB (ref 1.4–6.5)
Neutrophil %: 65.9 %
Platelet: 270 10*3/uL (ref 150–440)
RBC: 3.42 10*6/uL — ABNORMAL LOW (ref 4.40–5.90)
RDW: 13.9 % (ref 11.5–14.5)
WBC: 10.3 10*3/uL (ref 3.8–10.6)

## 2014-08-24 ENCOUNTER — Inpatient Hospital Stay: Payer: Self-pay | Admitting: Internal Medicine

## 2014-08-24 LAB — COMPREHENSIVE METABOLIC PANEL
ALBUMIN: 3.1 g/dL — AB (ref 3.4–5.0)
ALK PHOS: 68 U/L
ANION GAP: 8 (ref 7–16)
BUN: 30 mg/dL — AB (ref 7–18)
Bilirubin,Total: 0.4 mg/dL (ref 0.2–1.0)
CO2: 24 mmol/L (ref 21–32)
Calcium, Total: 8.6 mg/dL (ref 8.5–10.1)
Chloride: 109 mmol/L — ABNORMAL HIGH (ref 98–107)
Creatinine: 1.48 mg/dL — ABNORMAL HIGH (ref 0.60–1.30)
EGFR (African American): >60
GFR CALC NON AF AMER: 53 — AB
Glucose: 148 mg/dL — ABNORMAL HIGH (ref 65–99)
Osmolality: 290 (ref 275–301)
Potassium: 4.2 mmol/L (ref 3.5–5.1)
SGOT(AST): 11 U/L — ABNORMAL LOW (ref 15–37)
SGPT (ALT): 20 U/L
Sodium: 141 mmol/L (ref 136–145)
TOTAL PROTEIN: 8.4 g/dL — AB (ref 6.4–8.2)

## 2014-08-24 LAB — CBC WITH DIFFERENTIAL/PLATELET
BASOS ABS: 0 10*3/uL (ref 0.0–0.1)
BASOS PCT: 0.4 %
EOS ABS: 0.2 10*3/uL (ref 0.0–0.7)
EOS PCT: 1.6 %
HCT: 30.9 % — ABNORMAL LOW (ref 40.0–52.0)
HGB: 9.8 g/dL — ABNORMAL LOW (ref 13.0–18.0)
LYMPHS ABS: 2.3 10*3/uL (ref 1.0–3.6)
Lymphocyte %: 19.1 %
MCH: 28.3 pg (ref 26.0–34.0)
MCHC: 31.6 g/dL — AB (ref 32.0–36.0)
MCV: 90 fL (ref 80–100)
Monocyte #: 1.3 x10 3/mm — ABNORMAL HIGH (ref 0.2–1.0)
Monocyte %: 10.6 %
NEUTROS ABS: 8.1 10*3/uL — AB (ref 1.4–6.5)
Neutrophil %: 68.3 %
Platelet: 287 10*3/uL (ref 150–440)
RBC: 3.46 10*6/uL — AB (ref 4.40–5.90)
RDW: 13.8 % (ref 11.5–14.5)
WBC: 11.9 10*3/uL — AB (ref 3.8–10.6)

## 2014-08-24 LAB — SEDIMENTATION RATE

## 2014-08-25 LAB — BASIC METABOLIC PANEL
Anion Gap: 6 — ABNORMAL LOW (ref 7–16)
BUN: 26 mg/dL — AB (ref 7–18)
CHLORIDE: 108 mmol/L — AB (ref 98–107)
CO2: 25 mmol/L (ref 21–32)
Calcium, Total: 8.2 mg/dL — ABNORMAL LOW (ref 8.5–10.1)
Creatinine: 1.61 mg/dL — ABNORMAL HIGH (ref 0.60–1.30)
EGFR (African American): 59 — ABNORMAL LOW
EGFR (Non-African Amer.): 48 — ABNORMAL LOW
GLUCOSE: 79 mg/dL (ref 65–99)
OSMOLALITY: 281 (ref 275–301)
Potassium: 4.6 mmol/L (ref 3.5–5.1)
Sodium: 139 mmol/L (ref 136–145)

## 2014-08-25 LAB — CBC WITH DIFFERENTIAL/PLATELET
BASOS ABS: 0 10*3/uL (ref 0.0–0.1)
Basophil %: 0.6 %
EOS ABS: 0.2 10*3/uL (ref 0.0–0.7)
EOS PCT: 3 %
HCT: 26.4 % — AB (ref 40.0–52.0)
HGB: 8.5 g/dL — ABNORMAL LOW (ref 13.0–18.0)
LYMPHS PCT: 28.9 %
Lymphocyte #: 2.1 10*3/uL (ref 1.0–3.6)
MCH: 28.7 pg (ref 26.0–34.0)
MCHC: 32.1 g/dL (ref 32.0–36.0)
MCV: 90 fL (ref 80–100)
Monocyte #: 0.8 x10 3/mm (ref 0.2–1.0)
Monocyte %: 11.3 %
NEUTROS PCT: 56.2 %
Neutrophil #: 4 10*3/uL (ref 1.4–6.5)
Platelet: 252 10*3/uL (ref 150–440)
RBC: 2.95 10*6/uL — ABNORMAL LOW (ref 4.40–5.90)
RDW: 13.7 % (ref 11.5–14.5)
WBC: 7.1 10*3/uL (ref 3.8–10.6)

## 2014-08-26 LAB — VANCOMYCIN, TROUGH
VANCOMYCIN, TROUGH: 25 ug/mL — AB (ref 10–20)
VANCOMYCIN, TROUGH: 17 ug/mL (ref 10–20)

## 2014-08-26 LAB — BASIC METABOLIC PANEL
Anion Gap: 7 (ref 7–16)
BUN: 26 mg/dL — ABNORMAL HIGH (ref 7–18)
Calcium, Total: 7.7 mg/dL — ABNORMAL LOW (ref 8.5–10.1)
Chloride: 104 mmol/L (ref 98–107)
Co2: 25 mmol/L (ref 21–32)
Creatinine: 1.86 mg/dL — ABNORMAL HIGH (ref 0.60–1.30)
EGFR (African American): 50 — ABNORMAL LOW
GFR CALC NON AF AMER: 41 — AB
Glucose: 124 mg/dL — ABNORMAL HIGH (ref 65–99)
Osmolality: 278 (ref 275–301)
Potassium: 4.3 mmol/L (ref 3.5–5.1)
Sodium: 136 mmol/L (ref 136–145)

## 2014-08-27 LAB — CREATININE, SERUM
CREATININE: 1.68 mg/dL — AB (ref 0.60–1.30)
EGFR (African American): 56 — ABNORMAL LOW
GFR CALC NON AF AMER: 46 — AB

## 2014-08-28 LAB — BASIC METABOLIC PANEL
ANION GAP: 8 (ref 7–16)
BUN: 25 mg/dL — ABNORMAL HIGH (ref 7–18)
CALCIUM: 7.6 mg/dL — AB (ref 8.5–10.1)
CHLORIDE: 106 mmol/L (ref 98–107)
CO2: 22 mmol/L (ref 21–32)
Creatinine: 1.57 mg/dL — ABNORMAL HIGH (ref 0.60–1.30)
EGFR (African American): >60
EGFR (Non-African Amer.): 50 — ABNORMAL LOW
Glucose: 145 mg/dL — ABNORMAL HIGH (ref 65–99)
OSMOLALITY: 279 (ref 275–301)
POTASSIUM: 4.1 mmol/L (ref 3.5–5.1)
SODIUM: 136 mmol/L (ref 136–145)

## 2014-08-28 LAB — CBC WITH DIFFERENTIAL/PLATELET
BASOS PCT: 0.5 %
Basophil #: 0 10*3/uL (ref 0.0–0.1)
Eosinophil #: 0.2 10*3/uL (ref 0.0–0.7)
Eosinophil %: 2.1 %
HCT: 20.3 % — AB (ref 40.0–52.0)
HGB: 6.6 g/dL — ABNORMAL LOW (ref 13.0–18.0)
Lymphocyte #: 1.6 10*3/uL (ref 1.0–3.6)
Lymphocyte %: 20.5 %
MCH: 28.9 pg (ref 26.0–34.0)
MCHC: 32.3 g/dL (ref 32.0–36.0)
MCV: 89 fL (ref 80–100)
MONO ABS: 0.9 x10 3/mm (ref 0.2–1.0)
MONOS PCT: 11.6 %
Neutrophil #: 5 10*3/uL (ref 1.4–6.5)
Neutrophil %: 65.3 %
Platelet: 225 10*3/uL (ref 150–440)
RBC: 2.27 10*6/uL — ABNORMAL LOW (ref 4.40–5.90)
RDW: 13.3 % (ref 11.5–14.5)
WBC: 7.7 10*3/uL (ref 3.8–10.6)

## 2014-08-28 LAB — VANCOMYCIN, TROUGH: VANCOMYCIN, TROUGH: 21 ug/mL — AB (ref 10–20)

## 2014-08-29 LAB — CULTURE, BLOOD (SINGLE)

## 2014-08-29 LAB — WOUND CULTURE

## 2014-08-30 LAB — WOUND CULTURE

## 2014-08-30 LAB — PATHOLOGY REPORT

## 2014-09-01 LAB — CULTURE, BLOOD (SINGLE)

## 2014-09-02 LAB — CULTURE, BLOOD (SINGLE)

## 2014-09-16 ENCOUNTER — Encounter: Payer: Self-pay | Admitting: Cardiovascular Disease

## 2014-09-19 ENCOUNTER — Ambulatory Visit (INDEPENDENT_AMBULATORY_CARE_PROVIDER_SITE_OTHER): Payer: BC Managed Care – PPO | Admitting: Cardiovascular Disease

## 2014-09-19 ENCOUNTER — Encounter: Payer: Self-pay | Admitting: Cardiovascular Disease

## 2014-09-19 VITALS — BP 118/70 | HR 90 | Ht 73.0 in | Wt 227.0 lb

## 2014-09-19 DIAGNOSIS — E118 Type 2 diabetes mellitus with unspecified complications: Secondary | ICD-10-CM

## 2014-09-19 DIAGNOSIS — I5043 Acute on chronic combined systolic (congestive) and diastolic (congestive) heart failure: Secondary | ICD-10-CM | POA: Insufficient documentation

## 2014-09-19 DIAGNOSIS — E785 Hyperlipidemia, unspecified: Secondary | ICD-10-CM

## 2014-09-19 DIAGNOSIS — M86672 Other chronic osteomyelitis, left ankle and foot: Secondary | ICD-10-CM

## 2014-09-19 DIAGNOSIS — I5022 Chronic systolic (congestive) heart failure: Secondary | ICD-10-CM

## 2014-09-19 DIAGNOSIS — Z951 Presence of aortocoronary bypass graft: Secondary | ICD-10-CM

## 2014-09-19 DIAGNOSIS — I951 Orthostatic hypotension: Secondary | ICD-10-CM

## 2014-09-19 DIAGNOSIS — R5381 Other malaise: Secondary | ICD-10-CM

## 2014-09-19 DIAGNOSIS — I25709 Atherosclerosis of coronary artery bypass graft(s), unspecified, with unspecified angina pectoris: Secondary | ICD-10-CM

## 2014-09-19 MED ORDER — CARVEDILOL 6.25 MG PO TABS: 6.2500 mg | ORAL_TABLET | Freq: Two times a day (BID) | ORAL | Status: DC

## 2014-09-19 MED ORDER — ATORVASTATIN CALCIUM 40 MG PO TABS: 40.0000 mg | ORAL_TABLET | Freq: Every day | ORAL | Status: DC

## 2014-09-19 NOTE — Progress Notes (Signed)
Patient ID: [name] [name], male    DOB: 15-Jan-1961, 53 y.o.   MRN: [id number]  HPI Comments: [name] [name] is a 53 year old gentleman with history of diabetes, hyperlipidemia, coronary artery disease with bypass surgery x4 at the end of June 2015 with Dr. Cyndia Bent, with prior history of orthostatic hypotension who presents for routine follow-up  history in October 2014 of MRSA, requiring amputation of several of his toes, severe renal dysfunction at that time felt secondary to antibiotics with subsequent improvement of his renal function. Prior echocardiogram September 2014 showing ejection fraction less than 20% Most recent ejection fraction was 30-35%  In follow-up today, he reports that 3 weeks ago he had amputation of 3 more toes for nonhealing infection. He has been at [facility] since that time. He is scheduled to go home tomorrow. Overall he reports that his diabetes numbers have been well-controlled, blood pressure has been good. He denies having any lightheadedness or dizziness concerning for orthostatic hypotension. Midodrine and Florinef was held in the past and he has not needed these recently He reports creatinine 1.1 He's been maintained on Coreg 6.25 mg twice a day, no other blood pressure medications On his prior clinic visit, although he was asymptomatic, he was very orthostatic Orthostatics not checked today,  he is in his wheelchair  Past medical data Positive stress test at that time he was unable to undergo cardiac catheterization secondary to renal dysfunction  Prior cardiac catheterization may 2015 showing 70% left main disease, 70% diagonal #2 disease, 80% proximal circumflex disease, 80% mid circumflex disease, 99% proximal RCA disease, 99% PL branch disease, ejection fraction 40%  EKG done today shows normal sinus rhythm with rate 88 bpm, consider old anterior MI, old inferior MI, ST and T-wave abnormality in the anterolateral leads   Outpatient Encounter  Prescriptions as of 09/19/2014  Medication Sig  . aspirin EC 325 MG EC tablet Take 1 tablet (325 mg total) by mouth daily.  Marland Kitchen atorvastatin (LIPITOR) 40 MG tablet Take 1 tablet (40 mg total) by mouth daily at 6 PM.  . carvedilol (COREG) 6.25 MG tablet Take 6.25 mg by mouth 2 (two) times daily with a meal.  . Ferrous Sulfate (IRON) 325 (65 FE) MG TABS Take two tablets twice a day.  Marland Kitchen glimepiride (AMARYL) 2 MG tablet Take 1 tablet (2 mg total) by mouth daily with breakfast.  . oxyCODONE (OXY IR/ROXICODONE) 5 MG immediate release tablet Take 5 mg by mouth every 6 (six) hours as needed.   . zolpidem (AMBIEN) 5 MG tablet Take 5 mg by mouth at bedtime as needed for sleep.  . [DISCONTINUED] acetaminophen (TYLENOL) 325 MG tablet Take 1-2 tablets (325-650 mg total) by mouth every 4 (four) hours as needed for mild pain.      In terms of his social history  reports that he has never smoked. He has never used smokeless tobacco. He reports that he does not drink alcohol or use illicit drugs.  Review of Systems  Constitutional: Negative.   Respiratory: Negative.   Cardiovascular: Negative.   Musculoskeletal:       Left foot tenderness from recent surgery  Neurological: Negative.   All other systems reviewed and are negative.   BP 118/70 mmHg  Pulse 90  Ht 6\' 1"  (1.854 m)  Wt 227 lb (102.967 kg)  BMI 29.96 kg/m2  Physical Exam  Constitutional: He is oriented to person, place, and time. He appears well-developed and well-nourished.  Presents today in his wheelchair, left  foot in a bandage with protective shoe  HENT:  Head: Normocephalic.  Nose: Nose normal.  Mouth/Throat: Oropharynx is clear and moist.  Eyes: Conjunctivae are normal. Pupils are equal, round, and reactive to light.  Neck: Normal range of motion. Neck supple. No JVD present.  Cardiovascular: Normal rate, regular rhythm, S1 normal, S2 normal, normal heart sounds and intact distal pulses.  Exam reveals no gallop and no friction  rub.   No murmur heard. Pulmonary/Chest: Effort normal and breath sounds normal. No respiratory distress. He has no wheezes. He has no rales. He exhibits no tenderness.  Abdominal: Soft. Bowel sounds are normal. He exhibits no distension. There is no tenderness.  Musculoskeletal: Normal range of motion. He exhibits no edema or tenderness.  Lymphadenopathy:    He has no cervical adenopathy.  Neurological: He is alert and oriented to person, place, and time. Coordination normal.  Skin: Skin is warm and dry. No rash noted. No erythema.  Psychiatric: He has a normal mood and affect. His behavior is normal. Judgment and thought content normal.      Assessment and Plan   Nursing note and vitals reviewed.

## 2014-09-19 NOTE — Assessment & Plan Note (Signed)
Encouraged him to stay on his Lipitor. Goal LDL less than 70 Most recent lab work done through primary care

## 2014-09-19 NOTE — Patient Instructions (Addendum)
Your next appointment will be scheduled in our new office located at :  [place]  [address], [place], [place] [address]  You are doing well. Decrease the aspirin down to 81 mg x 2 daily  Please call us if you have new issues that need to be addressed before your next appt.  Your physician wants you to follow-up in: 6 months.  You will receive a reminder letter in the mail two months in advance. If you don't receive a letter, please call our office to schedule the follow-up appointment.

## 2014-09-19 NOTE — Assessment & Plan Note (Signed)
Currently with no symptoms of angina. No further workup at this time. Continue current medication regimen. 

## 2014-09-19 NOTE — Assessment & Plan Note (Signed)
He denies any lightheadedness. Previous orthostasis likely secondary to sepsis. Currently not on midodrine or Florinef. Unable to test his orthostatics on today's visit as he is in his wheelchair, recovering from surgery. Will not advance his medications for cardiomyopathy given prior orthostatic numbers on his last visit.

## 2014-09-19 NOTE — Assessment & Plan Note (Signed)
Ejection fraction 30-35%. He appears relatively euvolemic on today's visit. Coronary on carvedilol 6.25 g twice a day. In follow-up visits, would consider adding entresto 49/51 mg by mouth twice a day with slow titration upwards as tolerated

## 2014-09-19 NOTE — Assessment & Plan Note (Signed)
Recommended close follow-up with endocrine, Dr. Gabriel Carina

## 2014-09-19 NOTE — Assessment & Plan Note (Signed)
Status post amputation of 3 more toes 3 weeks ago, reports it is healing slowly

## 2014-09-19 NOTE — Assessment & Plan Note (Signed)
He has completed rehabilitation at [facility], going home tomorrow

## 2015-03-08 NOTE — Consult Note (Signed)
Brief Consult Note: Diagnosis: CHF/cardiomyopathy with severe systolic dysfx.   Consult note dictated.   Recommend further assessment or treatment.   Orders entered.   Comments: Patient with DM,PAD, possible osteomyelitis, ARF developed orthopnea and had echo with LVEF,20% and L pleural effusion. Patient does not have any h/o previous MI, has had HTN in past and now with mild tachycardia which could be secondary to infection. Will need to get 12 lead EKG, place on tele as at high risk of arrhythmias, will start low dose BB and titrate up if BP allows consider adding low hydralazine/nitrates. With worsening renal fx, will hold ACE-I/ARB, digoxin, diuretics, but may benefit from IV lasix if renal fx allows. Had worsening creatinine today and nephrology following. Patient is CP free at this time, can get outpatient Lexiscan stress myoview to r/o ischemic cardiomyopathy.  Electronic Signatures: Angelica Ran (MD)   (Signed 09-Sep-14 15:59)  Co-Signer: Brief Consult Note Gabriel Rung A (PA-C)   (Signed 09-Sep-14 08:31)  Authored: Brief Consult Note  Last Updated: 09-Sep-14 15:59 by Angelica Ran (MD)

## 2015-03-08 NOTE — Consult Note (Signed)
Admit Diagnosis:   DIABETIC FOOT INFECTION.: Onset Date: 20-Jul-2013, Status: Active, Description: DIABETIC FOOT INFECTION.      Admit Reason:   Uncontrolled type 2 diabetes with neuropathy (250.62): Status: Active, Coding System: ICD9, Coded Name: Type II or unspecified type diabetes mellitus with neurological manifestations, uncontrolled   DM type 2 causing complication (99991111): Status: Active, Coding System: ICD9, Coded Name: Type II or unspecified type diabetes mellitus with other specified manifestations, not stated as uncontrolled   Diabetic foot infection (250.80): Status: Active, Coding System: ICD9, Coded Name: Type II or unspecified type diabetes mellitus with other specified manifestations, not stated as uncontrolled    HTN:    diabetes:   Home Medications: Medication Instructions Status  Cipro 750 mg oral tablet 1 tab(s) orally every 12 hours Active  glyBURIDE-metFORMIN 5 mg-500 mg oral tablet 1 tab(s) orally 2 times a day Active  Vitamin B12 500 mcg oral tablet 1 tab(s) orally once a day Active   Lab Results: Routine Micro:  02-Sep-14 19:30   Micro Text Report BLOOD CULTURE   COMMENT                   NO GROWTH IN 18-24 HOURS   ANTIBIOTIC                         22:53   Micro Text Report BLOOD CULTURE   COMMENT                   NO GROWTH IN 18-24 HOURS   ANTIBIOTIC                       Routine Chem:  04-Sep-14 05:01   Glucose, Serum  221  BUN 14  Routine Hem:  02-Sep-14 19:30   WBC (CBC)  23.4  03-Sep-14 04:43   Erythrocyte Sed Rate  55 (Result(s) reported on 19 Jul 2013 at [facility].)  04-Sep-14 05:01   WBC (CBC)  17.8  Hemoglobin (CBC)  11.6  Hematocrit (CBC)  34.1   Radiology Results:  Radiology Results: XRay:    02-Sep-14 23:02, Foot Left Complete  Foot Left Complete  REASON FOR EXAM:    diabetic, swelling, redness eval osteo  COMMENTS:       PROCEDURE: DXR - DXR FOOT LT COMP W/OBLIQUES  - Jul 18 2013 11:02PM     RESULT: Comparison:   None    Findings:    AP, oblique, and lateral views of the right foot demonstrates no fracture   or dislocation. There is a bipartite medial hallux sesamoid. There is a   small amount of soft tissue gas a along the plantar aspect of the first   MTP joint likely from recent debridement. Infection is not excluded.    IMPRESSION:   Please see above.    Dictation Site: 1        Verified By: Jennette Banker, M.D., MD    No Known Allergies:    General Aspect Pt admitted with left foot diabetic ulceration.   Present Illness Pt with hx uncontrolled diabetes with recent ulceration .  Had worsening of sypmtoms after being placed on doxycyline and cipro.  Noticed darkening of toe on Tuesday pm and sent to er for evaluation.  Noted to have red swollen left foot with elevated wbc.  consulted for evaluation of left foot.   Case History and Physical Exam:  Primary Care Provider [facility]  Family History Diabetes Mellitus   HEENT PERLA   Cardiovascular Barely palpable dp pulse.  Palpable PT pulse   Neurological absent protective sensation   Skin Erythema dorsal to midfoot with lyphangitis to leg.  Not superficial bloody blisters, esp to 3rd toe.  Plantar ulcreration has purulence draining.    Impression Abscess left foot with possible osteomyelitis.   Plan Will need I & D left foot. MRI non-conclusive 2ndary to motion. Awaiting bone scan results. Will plan to OR tomorrow.  d/W family and patient possible need for bone removal if infected. NPO p midnight.   Electronic Signatures: Samara Deist (MD)  (Signed 04-Sep-14 13:20)  Authored: Health Issues, Significant Events - History, Home Medications, Labs, Radiology Results, Allergies, General Aspect/Present Illness, History and Physical Exam, Impression/Plan   Last Updated: 04-Sep-14 13:20 by Samara Deist (MD)

## 2015-03-08 NOTE — Op Note (Signed)
PATIENT NAME:  [name] [name], [name] [name] MR#:  [id number] DATE OF BIRTH:  1961-06-13  DATE OF PROCEDURE:  08/03/2013  PREOPERATIVE DIAGNOSES:  1.  Diabetic foot infection. 2.  Peripheral vascular disease. 3.  Renal insufficiency.   POSTOPERATIVE DIAGNOSES: 1.  Diabetic foot infection. 2.  Peripheral vascular disease. 3.  Renal insufficiency.   PROCEDURES:  1. Ultrasound guidance for vascular access to right basilic vein.  2. Fluoroscopic guidance for placement of catheter.  3. Insertion of peripherally inserted central venous catheter, right arm.  SURGEON: Algernon Huxley, MD  ANESTHESIA: Local.   ESTIMATED BLOOD LOSS: Minimal.   INDICATION FOR PROCEDURE: A 54 year old white male with diabetic foot infection and needs extended IV antibiotics. We are asked to place a PICC line.   DESCRIPTION OF PROCEDURE: The patient's right arm was sterilely prepped and draped, and a sterile surgical field was created. The right basilic vein was accessed under direct ultrasound guidance without difficulty with a micropuncture needle and permanent image was recorded. 0.018 wire was then placed into the superior vena cava. Peel-away sheath was placed over the wire. A single lumen peripherally inserted central venous catheter was then placed over the wire and the wire and peel-away sheath were removed. The catheter tip was placed into the superior vena cava and was secured at the skin at 39 cm with a sterile dressing. The catheter withdrew blood well and flushed easily with heparinized saline. The patient tolerated procedure well.   ____________________________ Algernon Huxley, MD jsd:sb D: 08/03/2013 15:31:40 ET T: 08/03/2013 17:16:46 ET JOB#: [id number]  cc: Algernon Huxley, MD, <Dictator> Algernon Huxley MD ELECTRONICALLY SIGNED 08/09/2013 12:59

## 2015-03-08 NOTE — Consult Note (Signed)
Brief Consult Note: Diagnosis: Diabetic foot infection with probable osteomyelitis.   Patient was seen by consultant.   Consult note dictated.   Recommend to proceed with surgery or procedure.   Recommend further assessment or treatment.   Orders entered.   Discussed with Attending MD.   Comments: Cont vanco zosyn start clinda (ordered) Agree with debridement WIll likely need picc and 4-6 wks iv abx Thank you for consult.  Electronic Signatures: Angelena Form (MD)  (Signed 05-Sep-14 09:14)  Authored: Brief Consult Note   Last Updated: 05-Sep-14 09:14 by Angelena Form (MD)

## 2015-03-08 NOTE — Op Note (Signed)
PATIENT NAME:  [name] [name], [name] [name] MR#:  [id number] DATE OF BIRTH:  26-Sep-1961  DATE OF PROCEDURE:  09/22/2013  PREOPERATIVE DIAGNOSES:  1.  Gangrene, left third and fourth toes.  2.  Full-thickness ulceration, dorsal left foot.   POSTOPERATIVE DIAGNOSES:  1.  Gangrene, left third and fourth toes.  2.  Full-thickness ulceration, dorsal left foot.   PROCEDURES: 1.  Third partial ray amputation, left foot.  2.  Fourth partial ray amputation, left foot.  3.  Excisional debridement to tendon and muscle, dorsal left foot.   SURGEON: Dimonique Bourdeau A. Vickki Muff, DPM  ANESTHESIA: IV sedation with local.   HEMOSTASIS: None.   COMPLICATIONS: None.   SPECIMEN: Gangrenous third and fourth toes for pathology.   ESTIMATED BLOOD LOSS: 25 mL.  OPERATIVE INDICATIONS: This is a 54 year old gentleman who has been followed in the outpatient clinic with gangrenous third and fourth toes with a full-thickness deep ulceration to his left forefoot region. He has undergone long-standing conservative treatment and presents today for surgery. All risks, benefits, alternatives and complications associated with surgery were discussed with the patient and full consent has been given.   DESCRIPTION OF PROCEDURE: The patient was brought into the OR and placed on the operating table in the supine position. IV sedation was administered by the anesthesia team. A local block was placed around the surgical site. After sterile prep and drape, attention was directed to the dorsal wound, over the dorsal and dorsal lateral aspect of the foot that measured 10 x 3.5 cm in length and width with a depth full thickness down to muscle and tendon. Excisional debridement was performed initially with a 15 blade removing all the necrotic tissue at this time. The third and fourth toes were noted to be gangrenous and these were then amputated at the MTPJ themselves. There was a large defect in the central aspect down to the metatarsal heads, which were  obviously exposed at this point. Next, with use of a VersaJet, I was able to remove all the residual necrotic fibrotic contaminated tissue to good healthy bleeding tissue. At this time, I decided to remove the third and fourth metatarsal heads to good bleeding bone, at the surgical neck, to assist in healing, as well as to offload pressure under the metatarsal heads where the previous ulcers had begun. Final debridement was then performed with a VersaJet. All bleeding was stopped with Surgiflo. The distal portion of the skin was reapproximated with a 2-0 nylon. Next, a wound VAC was placed to the dorsal aspect of the left foot overlying the excisional debridement site. The patient tolerated the procedure and anesthesia well and was transported from the OR to the PACU with all vital signs stable and neurovascular status intact. I will see him in the outpatient clinic in 5 to 7 days. He is to remain as nonweightbearing to his left foot as possible. We will get him a prescription for Vicodin for pain.  ____________________________ Pete Glatter. Vickki Muff, DPM jaf:sb D: 09/22/2013 10:09:56 ET T: 09/22/2013 10:34:29 ET JOB#: [id number]  cc: Larkin Ina A. Vickki Muff, DPM, <Dictator> Ariday Brinker DPM ELECTRONICALLY SIGNED 10/06/2013 16:21

## 2015-03-08 NOTE — H&P (Signed)
PATIENT NAME:  [name] [name], [name] [name] MR#:  [id number] DATE OF BIRTH:  Oct 17, 1961  DATE OF ADMISSION:  07/19/2013  REFERRING PHYSICIAN:  Dr. Owens Shark  PRIMARY CARE PHYSICIAN:  Dr. Ginette Pitman  PATIENT'S PODIATRIST:  Dr. Vickki Muff   HISTORY OF PRESENT ILLNESS:  [name] [name] is a 54 year old Caucasian gentleman with past medical history of type 2 diabetes, which has been poorly controlled, who is presenting with a 3 to 4 week ulceration of his foot, which has been acutely worsening.  Once again, he has noted this foot ulceration for about 4 weeks in total duration and it was evaluated by his podiatrist who did a local debridement,  when he noticed approximately 1 week ago increased erythema and edema, as well as pain. He was evaluated by his PCP, who placed him on Cipro for antibiotic coverage. His symptoms, however, have been worsening since that time. Currently, he is experiencing pain of the left foot, which is throbbing in nature,  8 to 10 in intensity, which is now 0 to 10 after morphine was provided by the Emergency Department. His pain was without radiation ,worsened by ambulation and improved with rest and pain medications. He, however, denies any fevers, chills, but does mention polyuria and polydipsia.    REVIEW OF SYSTEMS: CONSTITUTIONAL: Denies any fevers, chills, weight changes.  EYES: Denies any vision changes or eye pain.  ENT AND MOUTH: Denies any dysphagia, odynophagia.  CARDIOVASCULAR: Denies palpitations or chest pain.  RESPIRATORY: Denies shortness of breath or cough.  GASTROINTESTINAL: Denies nausea, vomiting, diarrhea.  GENITOURINARY: Polyuria as mentioned above. Denies any dysuria.  MUSCULOSKELETAL: Pain localized to the left foot. Denies any back pain.  SKIN: Skin changes over left foot as described, however, no other rashes.  NEUROLOGICAL: Denies having paresthesias or paralysis.  PSYCHIATRIC: Denies anxiety, depression.  ENDOCRINE: Does have polyuria, as well as polydipsia.   HEMATOLOGICAL/LYMPATICS: Denies easy bleeding or bruisability.  ALLERGY/IMMUNOLOGY: No active symptoms.   Otherwise, full review of systems performed by me is negative.   PAST MEDICAL HISTORY: Significant for type 2 diabetes; approximately 25 year duration. He has never been on insulin therapy; but  has been noncompliant with medications for many years.   SOCIAL HISTORY: Denies any alcohol, tobacco or drug usage.   FAMILY HISTORY: Significant for ovarian cancer in his mother who is deceased, as well as type 2 diabetes in his father, who is deceased.   ALLERGIES: No known allergies.   HOME MEDICATIONS: Include vitamin B12 1 tablet p.o. daily, glyburide/metformin 5/500 mg p.o. b.i.d..  PHYSICAL EXAMINATION: VITAL SIGNS:  Temperature 98.8, heart rate 104, respirations 20, blood pressure 123/81, saturating 98% on room air.  GENERAL: No acute distress, awake and oriented x 3.  HEENT: Normocephalic, atraumatic. Extraocular muscles intact. Pupils equal and reactive to light as well as accommodation. Moist mucosal membranes.  CARDIOVASCULAR: S1, S2, regular rate and rhythm. No murmurs, rubs or gallops.  PULMONARY: Clear to auscultation bilaterally, without wheezes, rubs or rhonchi.  ABDOMEN: Soft, nontender, nondistended, positive bowel sounds.  EXTREMITIES: Reveal no signs of cyanosis or clubbing. Left lower extremity: There is an approximately 1 x 1 cm ulceration which is clean-based on the sole of the foot, also the dorsum of the foot is erythematous, warm, tender to palpation with trace edema.  NEUROVASCULAR: He has regular neurovascular of the foot including capillary refill and pulses and has gross sensation intact, but diminished light sensation to the feet.  NEUROLOGIC: Cranial nerves II through XII intact. Decreased point sensation to the  feet bilaterally.  SKIN: As described above.   LABORATORY DATA: Sodium 131, potassium 4, chloride 98, bicarb 24, BUN 23, creatinine 1.18, glucose 261,  calcium 9.5, total protein 8.3, albumin 3.1, bilirubin 0.6, ALT is 20, AST 11, alk phos 101. WBC 23.4, hemoglobin 13.7, hematocrit 40, platelets 364.   ASSESSMENT AND PLAN: A 54 year old gentleman history of type 2 diabetes, diagnosed approximately 25 years ago, who is presenting with acute and chronic left foot pain. He has had known ulceration of the left foot for approximately 4 weeks with evaluation by podiatry, who did local debridement. He has had worsening symptoms of erythema, edema and pain. He saw his PCP who prescribed Cipro antibiotic therapy about 1 week ago; his symptoms, however, persisted.  1. Diabetic foot ulcer. X-ray did not reveal any bony involvement. Final read is pending. Though, given the duration of symptoms, the likelihood of osteomyelitis is high. I have  ordered an MRI to be performed to rule this out as it will greatly affect his treatment course and antibiotic time course.  In the meantime, check an ESR for antibiotic coverage, vancomycin for MRSA coverage as he has had a history of MRSA abscess in the past, otherwise,  Zosyn  every 6 hours for pseudomonal coverage, as well as p.r.n. pain control.  2.  Uncontrolled type 2 diabetes, complicated by neuropathy and foot ulceration. He is only on metformin and glyburide at home p.o. agent. Start insulin sliding scale higher dose. He will likely need basal insulin coverage as persistently high blood glucoses. Will add Lantus 5 units now. 3.  Hypernatremia in the setting of hyperglycemia.  4.  The patient is Full Code.   Total time: 35 minutes    ____________________________ Aaron Mose. Erbie Arment, MD dkh:nts D: 07/19/2013 01:51:50 ET T: 07/19/2013 02:27:06 ET JOB#: [id number]  cc: Aaron Mose. Aleya Durnell, MD, <Dictator> I[place]pa Geffre Woodfin Ganja MD ELECTRONICALLY SIGNED 07/26/2013 22:06

## 2015-03-08 NOTE — Discharge Summary (Signed)
PATIENT NAME:  [name] [name], [name] [name] MR#:  [id number] DATE OF BIRTH:  06-06-1961  DATE OF ADMISSION:  07/19/2013 DATE OF DISCHARGE:  08/10/2013  DISCHARGE DIAGNOSES: 1. Diabetic foot ulcer with gangrenous second and third toes.  2. Type 2 diabetes.  3. Peripheral vascular disease.  4. Cardiomyopathy.  5. Acute renal failure secondary to interstitial nephritis and ATN 6. Chronic ulcer involving anterior abdominal wall.   CHIEF COMPLAINT: Ulceration of the left foot that started approximately 4 weeks prior to admission.   The patient is a 54 year old male with history of type 2 diabetes, poorly controlled, presents to the Emergency Room with a diabetic foot ulcer that started four weeks prior to admission. The patient had seen Dr. Vickki Muff, who had done local debridement and also had been started on antibiotics, but complained of pain in the left foot which he describes as throbbing in nature. The patient denied any fevers or chills, but does have symptoms of polyuria and polydipsia.   PAST MEDICAL HISTORY: Significant for type 2 diabetes approximately 25 years, never been on insulin. He had not been compliant with the medications.   SOCIAL HISTORY: Denies any use of tobacco or alcohol.   HOME MEDICATIONS: Include glyburide and metformin 500 b.i.d. and vitamin B12.   PHYSICAL EXAMINATION: VITAL SIGNS: Temperature 98.8, heart rate 104, respirations 20, blood pressure 122/81, O2 sat 98% on room air.  GENERAL: He was not in distress.  HEENT: Mars, AT. HEART: S1, S2.  LUNGS: Clear.  ABDOMEN: Soft.  EXTREMITIES: Evidence of an ulcer with a clean base in the sole of the foot with a erythematous, warm area, tender to palpation. Subsequently, developed gangrene of the second and third toes.   LABORATORIES: Sodium 131, potassium 4, chloride 98, bicarbonate 24, BUN 23, creatinine 1.18 on admission. Glucose 261, calcium 9.5, total protein 8.3, albumin 3.1, bilirubin 0.6, ALT 20, AST 11, alkaline phosphatase  101. WBC count 23.4, hemoglobin 13.7, hematocrit 40, platelets 364.   HOSPITAL COURSE: The patient was admitted to [facility] and was seen in consultation by Dr. Vickki Muff. He was started initially on Zosyn and vancomycin; this was subsequently switched to Unasyn. Cultures from the wound grew moderate growth, Streptococcus agalactiae group B, and also Enterococcus faecalis and Prevotella bivia. The patient was seen in consultation also by Dr. Candiss Norse, nephrologist, and Dr. Ola Spurr ID. Debridement was done of the foot, but he subsequently had acute renal failure and his creatinine went up to 3.39, went all the way up to 5.74 before it started to slowly trend down. Unfortunately, he was short of breath with IV fluids and an echocardiogram showed evidence for significant congestive heart failure. Echocardiogram showed EF of less than 20% with severely decreased global LV systolic function, moderately increased left ventricular internal cavity size, moderate pleural effusion on the left, mild mitral regurgitation, mild elevated pulmonary artery pressure, mild tricuspid regurgitation.   The patient was also seen in consultation by cardiologist, Dr. Humphrey Rolls. Cardiac catheterization could not be done because of acute renal failure. A nuclear medicine myocardial scan showed ejection fraction estimation of 32%. Left ventricular global function was severely reduced. Pharmacological myocardial perfusion study did not show evidence of significant ischemia. The patient's antibiotics subsequently switched to Cipro and clindamycin because of possible interstitial nephritis. A renal biopsy was also done, which confirmed evidence of interstitial cystitis. The patient's renal function gradually improved and on discharge, serum creatinine was 4.64. Home health nursing was also arranged for him. Physical therapy was also arranged. He was seen  in consultation also by vascular, Dr. Delana Meyer, who felt that he needed a vascular study to rule out  significant peripheral vascular disease, but was unable to give him dye because of the renal failure. It was felt that this could be done as an outpatient after his renal function improved. He was discharged home on the following medications:   DISCHARGE MEDICATIONS: Omeprazole 20 mg once a day, isosorbide mononitrate 30 mg once a day, carvedilol 6.25 mg b.i.d., furosemide 40 mg once a day, ciprofloxacin 500 mg every 18 hours, glyburide 15 mg once a day and 5 mg in the evening, prednisone 20 mg once a day, Cleocin HCl 300 mg every 8 hours.   He has been advised to follow up with Dr. Gabriel Carina,  Dr. Humphrey Rolls,  Dr. Candiss Norse, Dr. Delana Meyer, Dr. Vickki Muff and myself, Dr. Ginette Pitman.  TOTAL TIME SPENT IN DISCHARGE OF THIS PATIENT: 45 minutes.   The patient and his wife were advised to call back if there are any questions or concerns.   ____________________________ Tracie Harrier, MD vh:sg D: 08/13/2013 13:23:00 ET T: 08/13/2013 14:00:51 ET JOB#: [id number]  cc: Tracie Harrier, MD, <Dictator> Tracie Harrier MD ELECTRONICALLY SIGNED 08/21/2013 18:29

## 2015-03-08 NOTE — Consult Note (Signed)
PATIENT NAME:  [name] [name], [name] [name] MR#:  [id number] DATE OF BIRTH:  1961/06/19  DATE OF CONSULTATION:  08/02/2013  REFERRING PHYSICIAN:  Tracie Harrier, MD CONSULTING PHYSICIAN:  A. Lavone Orn, MD  CHIEF COMPLAINT: Uncontrolled diabetes.   HISTORY OF PRESENT ILLNESS: This is a 54 year old male seen in consultation for uncontrolled diabetes at the request of Dr. Ginette Pitman. The patient was admitted on September 3rd with a nonhealing foot ulceration which had progressed despite outpatient antibiotics. He has gangrenous development of the left second and third digits. He has been on a prolonged course of antibiotics and over this course has developed acute renal failure. His creatinine is now 4.8. He has had diabetes for about 25 years. He is currently on a regimen of glyburide 5 mg t.i.d. as well as a NovoLog insulin sliding scale. Over the last 24 hours, blood sugars have been in the range of 112-242. Prior to hospital admission, he had been on a regimen of glyburide/metformin 5/500 mg twice daily. The patient admits that he was not very compliant with diabetes care in the past. He was not compliant with a diabetic diet. His last hemoglobin A1c on 07/03/2013 was 15.2%. Diabetes is uncontrolled.   MEDICAL HISTORY:  1. Type 2 diabetes.  2. Diabetic peripheral neuropathy.  3. Peripheral vascular disease.  4. Hypertension.  5. Hyperlipidemia.   PAST SURGICAL HISTORY:  1. Tonsillectomy.  2. Bilateral cataract repair.   FAMILY HISTORY: Father had diabetes. Mother had ovarian cancer.   ALLERGIES: No known drug allergies.   SOCIAL HISTORY: The patient is married. He denies tobacco use.   CURRENT INPATIENT MEDICATIONS:  1. Glyburide 5 mg t.i.d.  2. NovoLog insulin sliding scale.  3. Isosorbide 30 mg daily.  4. Unasyn 3 g once daily.  5. Coreg 6.25 mg b.i.d.  6. Lasix 60 mg daily.  7. Hydralazine 25 mg b.i.d.   REVIEW OF SYSTEMS:  GENERAL: No weight loss. No fever.  HEENT: No blurred vision.  No sore throat.  NECK: No neck pain. No dysphasia.  CARDIAC: No chest pain. No palpitation.  PULMONARY: No cough. No shortness of breath.  ABDOMEN: No abdominal pain. No nausea.  EXTREMITIES: No leg edema.  SKIN: No rash or recent skin changes.   PHYSICAL EXAMINATION:  VITAL SIGNS: Height 72.9 inches, weight 221 pounds, BMI 29.2, temp 97.7, pulse 91, respirations 18, blood pressure 106/72.  GENERAL: White male, appears chronically ill, no acute distress.  HEENT: EOMI. Oropharynx is clear.  NECK: Supple. No thyromegaly.  CARDIAC: Regular rate and rhythm. No murmur.  PULMONARY: Clear to auscultation bilaterally. No wheeze.  ABDOMEN: Diffusely soft, nontender, nondistended. In the mid abdomen, there is a small ulceration which is dressed, dressing is clean and dry.  EXTREMITIES: No edema is present. The left second and third toes are blackened. Skin on both feet is dry. Hammertoes are present bilaterally.  NEUROLOGIC: No gross deficits.  PSYCHIATRIC: Alert and oriented, calm and cooperative.   LABORATORY: Blood glucose 246, BUN 65, creatinine 5.4, sodium 137, potassium 4.1, calcium 8.6.   ASSESSMENT: A 54 year old male with uncontrolled diabetes in the past, due to nonadherence with his prescribed regimen. Currently, sugars are still not ideally controlled.   RECOMMENDATIONS:  1. Would add Lantus 14 units at bedtime.  2. Decrease glyburide to 5 mg b.i.d.   3. Add NovoLog 4 units t.i.d. a.c.  4. Adjust NovoLog sliding scale 2 units per 50 over a blood sugar of 150.  5. He would benefit from a low-carbohydrate  diet as you are doing.   Thanks for the kind request for consultation. I will follow along with you.   ____________________________ A. Lavone Orn, MD ams:gb D: 08/02/2013 17:46:14 ET T: 08/02/2013 19:47:52 ET JOB#: [id number]  cc: A. Lavone Orn, MD, <Dictator> Sherlon Handing MD ELECTRONICALLY SIGNED 08/11/2013 15:44

## 2015-03-08 NOTE — Op Note (Signed)
PATIENT NAME:  KERIC, [name] [name] MR#:  [id number] DATE OF BIRTH:  09-21-1961  DATE OF PROCEDURE:  07/21/2013  PREOPERATIVE DIAGNOSIS: Left foot infection with abscess.   PROCEDURE: Incision and drainage, multiple spaces, dorsal and plantar left foot.   SURGEON: Tali Coster A. Alette Kataoka, DPM.   HEMOSTASIS: Mid-calf tourniquet inflated to 250 mmHg for approximately 40 minutes.   COMPLICATIONS: None.   SPECIMEN: Wound culture, left foot.   OPERATIVE INDICATIONS: This is a 54 year old gentleman with worsening symptoms from a diabetic foot ulcer to his left foot, where he has had increasing redness, swelling and darkened discoloration to his third toe. He had admitted with an elevated white blood cell count with noted obvious infection to his left foot. He has had an MRI that was inconclusive secondary to motion artifact. He had a bone scan that showed multiple areas of uptake throughout the ankle and foot that really did not clinically correlate with where this site of infection was. At this time, we brought him to the OR for operative I and D, drainage of infection and possible bone excision if there was any obvious bony infection.   DESCRIPTION OF PROCEDURE: The patient was brought into the Operating Room and placed on the operating table in the supine position. General intubation was administered by the anesthesia team. The left lower extremity was then prepped and draped in the usual sterile fashion. Attention was directed to the plantar aspect of the left foot, where after inflation of the tourniquet, the ulcer that was just plantar to the third metatarsophalangeal joint was probed. This was probed directly from plantar to dorsal into the foot. An incision was made that was a web space splitting incision between the third and fourth MTPJs. This was taken dorsally. A large amount of purulent drainage was noted at this time. This did not track under the plantar forefoot region but definitely tracked dorsally  to the proximal mid foot, coursed dorsolateral to about the level of the calcaneocuboid joint region. All purulent drainage was noted and removed. At this time, the deep web space of the third intermetatarsal space was then debrided with a Versajet. This was an excisional debridement removing all infected necrotic tissue. There was obvious superficial necrosis of skin on the left third toe. I performed an incision to the plantar aspect of this, opened this up and drained purulence that had entered into the tip of the third toe. In no area was any obvious bone exposed. There were no erosive changes that were obvious at this time. All wounds were then flushed with 3 liters of sterile saline with a pulsed lavage. The wound was packed and the dorsal and plantar incisions were closed loosely with a 3-0 nylon. A well compressive sterile bulky dressing was placed on the left foot and ankle. The patient tolerated the procedure and was taken from the Operating Room to the PACU with all vital signs stable and neurovascular status intact. He will be readmitted to the floor. Will continue to monitor him. He understands he might undergo further debridement as needed. He may need a wound VAC in the near future as well.    ____________________________ Pete Glatter. Vickki Muff, DPM jaf:jm D: 07/21/2013 14:14:30 ET T: 07/21/2013 16:14:29 ET JOB#: [id number]  cc: Larkin Ina A. Vickki Muff, DPM, <Dictator> Shadman Tozzi DPM ELECTRONICALLY SIGNED 07/26/2013 9:53

## 2015-03-08 NOTE — Consult Note (Signed)
PATIENT NAME:  [name] [name], [name] [name] MR#:  [id number] DATE OF BIRTH:  1961/06/18  DATE OF CONSULTATION:  07/25/2013  REFERRING PHYSICIAN:  Tracie Harrier, MD CONSULTING PHYSICIAN:  Merla Riches, PA-C  PRIMARY CARE PHYSICIAN: Tracie Harrier, MD  PODIATRIST: Samara Deist, MD  HISTORY OF PRESENT ILLNESS: [name] [name] is a 54 year old white male with a past medical history significant for diabetes mellitus type II (poorly controlled), worsening left foot ulceration, who was admitted and later found to be in acute renal failure. During the patient's hospital admission, he developed orthopnea and required oxygen and echocardiogram was performed which showed severe left ventricular systolic dysfunction with an ejection fraction of less than 20%, moderate pleural effusion, mild mitral and tricuspid regurgitation. The patient denies any chest pain, chest pressure, jaw pain or left arm pain. About 2 years ago, he had 1 episode where he felt shortness of breath and flushed, but his symptoms soon resolved and he never got any medical treatment for this. To his knowledge he has never had a myocardial infarction and has never had previous cardiac work-up such as stress test, echo or cardiac catheterization. The patient notes that he has not had any orthopnea until this admission. He has had a nonproductive cough, but denies wheezing, PND, edema of the right foot, palpitations or fast heart rate. In the past, the patient did have hypertension and was on medication for it. The patient knows that lately his blood pressure has been well controlled.   PAST MEDICAL HISTORY: 1.  Diabetes mellitus type 2 (poorly controlled).  2.  Hyperlipidemia.   PAST SURGICAL HISTORY: 1.  Left foot surgery for possible osteomyelitis/nonhealing wound.  2.  Bilateral cataract extraction with lens implantation.  3.  Tonsillectomy as a child.   ALLERGIES: No known drug allergies.   HOME MEDICATIONS: 1.  Vitamin B12 1 tablet  p.o. daily. 2.  Glyburide/metformin 5/500 mg p.o. b.i.d.   SOCIAL HISTORY: The patient has never smoked in the past. Denies alcohol use. Denies illicit drug use. Drinks 1 cup of coffee daily.   FAMILY HISTORY: Father with coronary artery disease, renal failure and diabetes mellitus. Mother with ovarian cancer.   REVIEW OF SYSTEMS: CONSTITUTIONAL: The patient denies any fevers, chills.  EYES: The patient denies any double vision, blurry vision.  EARS, NOSE AND THROAT: The patient denies any dysphasia or odynophagia, tinnitus or epistaxis.  RESPIRATORY: The patient is with shortness of breath, nonproductive cough, orthopnea. CARDIOVASCULAR: The patient denies any chest pain or palpitations.  GASTROINTESTINAL: The patient denies abdominal pain, nausea or vomiting.   PHYSICAL EXAMINATION: GENERAL: This is a pleasant male who is not in any acute distress. He is sitting up eating breakfast. He is alert and oriented x 3.  VITAL SIGNS: Temperature 98.2 degrees Fahrenheit, heart rate 105, respiratory rate 20, blood pressure 136/93 and O2 sat is 96% on room air.  HEENT: Head atraumatic, normocephalic. Pupils are equal. Conjunctivae are pale, pink. Ears and nose normal to external inspection. Mouth with moist mucous membranes. NECK: Supple. Trachea is midline. No carotid bruits. No JVD.  LUNGS: No accessory muscle use. The patient has crackles at the bases bilaterally.  HEART: Tachycardic rate. No murmurs, rubs, or gallops can be appreciated.  ABDOMEN: Nondistended. Bowel sounds present in all 4 quadrants. Soft to palpation.  EXTREMITIES: Right leg with no cyanosis, clubbing or edema. Left foot is wrapped status post surgery.   ANCILLARY DATA: Bilateral kidney ultrasound done on 09/07 with normal appearing kidneys.   Nuclear  bone imaging done on 09/04 with findings concerning for cellulitis and osteomyelitis in the left foot and ankle.   Echocardiogram done on 07/24/2013: Left ventricular ejection  fraction by visual estimation less than 20%, severely decreased global left ventricular systolic function, moderately increased left ventricular internal cavity size, moderate pleural effusion in the left lateral region, mild mitral regurgitation, mild tricuspid regurgitation, mildly elevated pulmonary artery systolic pressures.   LABORATORY DATA: Glucose 127, BUN 41, creatinine 4.46, sodium 136, potassium 3.8, chloride 107. Estimated GFR 14. Osmolality 284. White blood cell count 15.8, hemoglobin 11.0, hematocrit 31.7, platelet count 276,000.   ASSESSMENT/PLAN: 1.  Congestive heart failure/cardiomyopathy. The patient has developed orthopnea on this admission and an ejection fraction less than 20% visually. The patient does not have any previous history of myocardial infarction and has never had cardiac evaluation such as nuclear stress testing, echocardiogram or cardiac catheterization. It is unknown what the etiology of his cardiomyopathy is. Will need to rule out ischemia. He has had previous hypertension in the past, but his blood pressure has been well controlled recently. Tachycardia is likely due to  underlying infection at this time. We will add a low-dose beta blocker and titrate up as his blood pressure allows. Can consider adding hydralazine, isosorbide mononitrate combination as we are unable to use ACE inhibitors, ARBs, digoxin and diuretic therapy with his current renal function. The patient is at high risk for cardiac arrhythmias with this ejection fraction. Will get a 12-lead EKG and place on telemetry. Once the patient's acute infection has resolved, can get Lexiscan stress Myoview to rule out ischemic cardiomyopathy.  2.  Diabetes mellitus. Better controlled on this admission with blood sugar from this morning 132. Hospitalist following. 3.  Acute renal failure. Nephrology has been following this patient. No dialysis has been recommended at this point. 4.  Peripheral arterial disease with  osteomyelitis. Both vascular surgery and infectious disease have been following the patient. He is currently on IV antibiotics. Arteriogram is contraindicated at this time due to his renal insufficiency.   Thank you very much for this consultation and allowing [place] to participate in this patient's care.  ____________________________ Merla Riches, PA-C mam:sb D: 07/25/2013 08:41:03 ET T: 07/25/2013 08:53:24 ET JOB#: [id number]  cc: Merla Riches, PA-C, <Dictator> Tracie Harrier, MD Katha Cabal, MD Eiden Bagot A [facility] [place] ELECTRONICALLY SIGNED 07/26/2013 21:15

## 2015-03-08 NOTE — Consult Note (Signed)
Chief Complaint and History:  Referring Physician Dr. Ginette Pitman   Chief Complaint Uncontrolled diabetes   Allergies:  No Known Allergies:   Assessment/Plan:  Assessment/Plan 54 y/o M with type 2 diabetes, uncontrolled, admitted with left foot ulcer and gangrene of the left 2nd and 3rd digit as well as acute renal failure. Sugars have been in the 112-242 range in the last 24 hours. He is receiving glipizide and NovoLog SSI. In total he has received 24 units on the SSi in the last 24 hours. He is tol his diet. He has seen the CDEs.   A/ Uncontrolled diabetes  P/ Add Lantus 14 units qhs Add NovoLog 4 units tid AC Change NovoLog SSI to 2 units per 50 over a target of 150. Goal sugars should be 80-180. Will arrange out-pt f/u and will follow along with you.  Full consult to be dictated.   Electronic Signatures: Judi Cong (MD)  (Signed 17-Sep-14 17:39)  Authored: Chief Complaint and History, ALLERGIES, HOME MEDICATIONS, Assessment/Plan   Last Updated: 17-Sep-14 17:39 by Judi Cong (MD)

## 2015-03-08 NOTE — Consult Note (Signed)
PATIENT NAME:  [name] [name], [name] [name] MR#:  [id number] DATE OF BIRTH:  1961/09/03  DATE OF CONSULTATION:  07/21/2013  REFERRING PHYSICIAN:  Dr. Geralyn Corwin. CONSULTING PHYSICIAN:  Cheral Marker. Ola Spurr, MD  CONSULTING PODIATRIST:  Dr. Vickki Muff.  HISTORY OF PRESENT ILLNESS: This is a 54 year old gentleman we are asked to see for diabetic foot infection on the left. He has a history of poorly controlled diabetes. He reports that he has had ulceration on the bottom of his left foot for about 1 month now. He said he was in the pool at home and the callus softened and he pulled it off. It has, again, been worsening now over the last several days to 1 week. He was seen by podiatry and had some local debridement. He has very poor sensation there. He was given ciprofloxacin for antibiotic coverage; however, it continued to worsen. He came to the ER due to the pain. He denied any fevers or chills.   PAST MEDICAL HISTORY: 1.  Diabetes, poorly controlled, 25 year duration. He has been noncompliant for many years.  PAST SURGICAL HISTORY: None.   SOCIAL HISTORY: The patient works Film/video editor filters in [employer] He is married. He has grown children. He does not smoke, drink or use drugs.   FAMILY HISTORY: Positive for ovarian cancer in his mother and type 2 diabetes in his father.   ALLERGIES: No known drug allergies.   MEDICATIONS: At home included vitamin B12 and glyburide, metformin.   ANTIBIOTICS SINCE ADMISSION: Include:  1.  Zosyn, begun on September 2nd.  2.  Clindamycin, he received 1 dose of on September 2nd.  3.  Vancomycin, begun on September 2nd as well.   OTHER MEDICATIONS: Include sliding scale insulin, glyburide, acetaminophen, Toradol, Zofran, Roxicodone.   PHYSICAL EXAMINATION: VITAL SIGNS: T-max last 24 hours 100.6. Blood pressure 114/77, respirations 18, pulse of 111, sat of 93%.  GENERAL: He is slightly overweight, in no acute distress, slightly disheveled, lying in bed.  HEENT:  Pupils equal, round, reactive to light and accommodation. Extraocular movements are intact. Sclerae anicteric.  Oropharynx is clear. NECK: Supple.  HEART: Regular but tachy.  LUNGS: Clear to auscultation bilaterally.  ABDOMEN: Soft, nontender, nondistended. No hepatosplenomegaly.  EXTREMITIES: In his left lower extremity, he has 2+ edema up to his ankle. He has a plantar ulcer that is draining some thin pus that is over his second metatarsal head. He has diffuse erythema, as well as a black blister on the dorsum of his foot. There is some streaking. On his right leg, he has healed scab over the plantar surface of his foot.  NEUROLOGIC: He is alert and oriented x 3. He has profound peripheral neuropathy and minimal sensation in his distal feet.   LABORATORY DATA: White blood count on admission was 23.4, decreased to 17.8 and is now up to 21.3. Hemoglobin 12.0, platelets 296. He has a total neutrophil count of 17,000. Vancomycin trough done yesterday was 11. Sed rate is 55. Renal function is normal with a creatinine of 0.96. LFTs are normal. His blood cultures x 2 from September 2nd are negative. Wound culture done shows a few white cells and a few gram-positive cocci in pairs. Culture is pending.   IMAGING: The patient initially had x-ray of his foot on September 2nd. This shows a small amount of soft tissue gas along the plantar aspect of the right MTP joint likely from recent debridement. Infection is not excluded. MRI done September 3rd showed excessive foot movement making the  evaluation uninterpretable. Ultrasound of the left leg for edema suggested no DVT. Bone scan done September 4th showed findings concerning for cellulitis and osteomyelitis in the left foot and ankle region. There were extensive areas of abnormality, especially in the area of the lateral malleolus, medial malleolus and the proximal fourth and fifth metatarsals, as well as the first digit proximal phalanx near the first  metatarsophalangeal joint.   IMPRESSION: A 54 year old gentleman with poorly controlled diabetes who has a foot ulcer on the plantar surface that has progressed to an extensive cellulitis and likely osteomyelitis. He had a white count that is quite elevated. His ESR is 50. He has been seen by podiatry and will be going for debridement today.   RECOMMENDATIONS: 1.  At this point, he will need to continue the vancomycin and Zosyn pending cultures. I would restart him on clindamycin as well 600 q.8 hours given that he has some gas noted on the x-ray, and his white count has been increasing. This is first concern of some toxin-producing organisms.  2.  It is quite likely he will need a PICC line and IV antibiotics, but this will depend partially on what is found at surgery. I would recommend that we go ahead and place a PICC line and tailor antibiotics to cultures from his surgery. Regardless of what grows, he will need broad coverage, given the exposed nature on the foot and the diabetes and severe peripheral neuropathy. This is most likely a mixed infection with gram-positive, gram-negative and anaerobes.   Thank you for the consult. I will be glad to follow with you. This was a moderately complex consult.  ____________________________ Cheral Marker. Ola Spurr, MD dpf:dmm D: 07/21/2013 09:12:00 ET T: 07/21/2013 09:36:27 ET JOB#: [id number]  cc: Cheral Marker. Ola Spurr, MD, <Dictator> Dorina Ribaudo Ola Spurr MD ELECTRONICALLY SIGNED 07/23/2013 20:44

## 2015-03-08 NOTE — Consult Note (Signed)
Present Illness is a 54 year old Caucasian gentleman with past medical history of type 2 diabetes, which has been poorly controlled, who is presenting with a 3 to 4 week ulceration of his foot, which has been acutely worsening.  Once again, he has noted this foot ulceration for about 4 weeks in total duration and it was evaluated by his podiatrist who did a local debridement,  when he noticed approximately 1 week ago increased erythema and edema, as well as pain. He was evaluated by his PCP, who placed him on Cipro for antibiotic coverage. His symptoms, however, have been worsening since that time.  Yesterday he had further debridement and on inspection today several of the toes on the left foot now appear dusky.  I am asked to evaluate his arterial perfusion.  He denies claudication symptoms. No prior history of ulcerations.  PAST MEDICAL HISTORY: Significant for type 2 diabetes; approximately 25 year duration. He has never been on insulin therapy; but  has been noncompliant with medications for many years.   Home Medications: Medication Instructions Status  Vitamin B12 500 mcg oral tablet 1 tab(s) orally once a day Active  glyBURIDE-metFORMIN 5 mg-500 mg oral tablet 1 tab(s) orally 2 times a day Active  Cipro 750 mg oral tablet 1 tab(s) orally every 12 hours Active    No Known Allergies:   Case History:  Family History Non-Contributory   Social History negative tobacco, negative ETOH, negative Illicit drugs   Review of Systems:  Fever/Chills No   Cough No   Sputum No   Abdominal Pain No   Diarrhea No   Constipation No   Nausea/Vomiting No   SOB/DOE No   Chest Pain No   Telemetry Reviewed NSR   Dysuria No   Physical Exam:  GEN well developed, well nourished, no acute distress   HEENT PERRL, hearing intact to voice   NECK supple  trachea midline   RESP normal resp effort  no use of accessory muscles   CARD regular rate  no JVD   ABD denies tenderness  soft   nondistended   EXTR negative cyanosis/clubbing, positive edema, 1+ popliteal pulses bialterally; nonpalpable DP and PT pulses bilaterally   SKIN positive ulcers, left foot multiple ulcers 3rd and 4th toes dusky   NEURO cranial nerves intact, follows commands   PSYCH alert, A+O to time, place, person   Nursing/Ancillary Notes: **Vital Signs.:   06-Sep-14 04:00  Vital Signs Type Routine  Temperature Temperature (F) 99  Celsius 37.2  Temperature Source oral  Pulse Pulse 110  Respirations Respirations 20  Systolic BP Systolic BP 229  Diastolic BP (mmHg) Diastolic BP (mmHg) 80  Mean BP 93  Pulse Ox % Pulse Ox % 92  Pulse Ox Activity Level  At rest  Oxygen Delivery Room Air/ 21 %   Routine Chem:  06-Sep-14 04:57   Glucose, Serum  200  BUN 16  Creatinine (comp)  1.85  Sodium, Serum  132  Potassium, Serum 3.7  Chloride, Serum 99  CO2, Serum 26  Calcium (Total), Serum  8.4  Anion Gap 7  Osmolality (calc) 271  eGFR (African American)  48  eGFR (Non-African American)  42 (eGFR values <72m/min/1.73 m2 may be an indication of chronic kidney disease (CKD). Calculated eGFR is useful in patients with stable renal function. The eGFR calculation will not be reliable in acutely ill patients when serum creatinine is changing rapidly. It is not useful in  patients on dialysis. The eGFR calculation may  not be applicable to patients at the low and high extremes of body sizes, pregnant women, and vegetarians.)  Routine Hem:  06-Sep-14 04:57   WBC (CBC)  20.4  RBC (CBC)  3.82  Hemoglobin (CBC)  11.4  Hematocrit (CBC)  33.7  Platelet Count (CBC) 330  MCV 88  MCH 30.0  MCHC 34.0  RDW 12.3  Neutrophil % 78.3  Lymphocyte % 11.5  Monocyte % 9.2  Eosinophil % 0.3  Basophil % 0.7  Neutrophil #  16.0  Lymphocyte # 2.3  Monocyte #  1.9  Eosinophil # 0.1  Basophil # 0.1 (Result(s) reported on 22 Jul 2013 at 05:32AM.)   XRay:    04-Sep-14 15:22, Bone Imaging 3 phase study  Bone  Imaging 3 phase study   REASON FOR EXAM:    part 2  COMMENTS:       PROCEDURE: NM  - NM BONE IMAGING 3 PHASE STUDY  - Jul 20 2013  3:22PM     RESULT: The patient received a dose of 23.68 mCi of technetium 3 M MDP.   Triphasic imaging is performed with angiographic phase imaging showing   extremely increased flow over the left foot and ankle region compare to   the right. The blood pool and delayed phase images also show increased   localization of the left foot in multiple foci extending into the ankle   region compareto the right. SPECT CT was performed after the 3 hour   delayed images. The low-dose noncontrast CT images, attenuation corrected   SPECT images and fused SPECT CT data sets are reconstructed in the axial,   coronal and sagittal planes by the [employer] software. A rotating   3-dimensional images also created from the SPECT data.  There are extensive areas of abnormality on the SPECT images especially   in the area of the lateral malleolus, medial malleolus and of the   proximal fourth and fifth metatarsals as well as in the first digit   optimal phalanx proximal portion near the first metatarsal phalangeal   joint. Other less intense areas are seen at the base of the first and   second metatarsals and in the talonavicular region laterally as well is   in the tibiotalar region worse laterally and in the talocalcaneal joint   and to a lesser extent in the subtalar region. The other bony structures   show less intense fairly diffuse increased localization. No definite bony   destruction is evident. Correlate radiographically. MRI is available for   further evaluation if desired as is a labeled white cell scan.    IMPRESSION:  Findings concerning for cellulitis and osteomyelitis in the   left foot and ankle region. Correlate with clinicaland laboratory data.   Radiographic correlation and possibly MRI followup could also be     considered.     Dictation Site:  2        Verified By: Sundra Aland, M.D., MD    Impression 1.  PAD associated with ulceration        will need angiogram and possible intervention to maximize perfusion for would healing and limb salvage.        risks and benefits as well as alternatives were reviewed patient agree to proceed with angiography 2.  Renal insufficiency         will continue hydration for now and avoid nephrotoxic drugs         bicarb before angiogram 3.  Cellulitis and possible  osteomyelitis         on Abx          Podiatry and ID on consult 4.  Diabetes         monitor diet and sliding scale 5.  Obesity         nutrional consult   Plan level 5 consult   Electronic Signatures: Hortencia Pilar (MD)  (Signed 06-Sep-14 15:49)  Authored: General Aspect/Present Illness, Home Medications, Allergies, History and Physical Exam, Vital Signs, Labs, Radiology, Impression/Plan   Last Updated: 06-Sep-14 15:49 by Hortencia Pilar (MD)

## 2015-03-09 NOTE — Discharge Summary (Signed)
PATIENT NAME:  [name], [name] MR#:  [id number] DATE OF BIRTH:  22-Jun-1961  DATE OF ADMISSION:  08/24/2014 DATE OF DISCHARGE:    DISCHARGE DIAGNOSES:  1.  Diabetic foot ulcer with Staphylococcus aureus infection and osteomyelitis status post transmetatarsal amputation.  2.  Methicillin-sensitive Staphylococcus aureus bacteremia.  3.  Type 2 diabetes.  4.  Coronary artery disease.  5.  Hypertension.  6.  Hyperlipidemia.  7.  Benign prostatic hypertrophy.  8.  Anemia.  9.  Hyperlipidemia.   CHIEF COMPLAINT: Fever, ulceration of the left foot with serosanguinous drainage.   HISTORY OF PRESENT ILLNESS: [name] [name] is a 54 year old gentleman with a history of type 2 diabetes, CAD, ischemic cardiomyopathy, PAD, chronic kidney disease, who presented with a 1 week history of low-grade fevers, general malaise. The patient has been having persistent left foot ulcer which has been slow to heal and lately he has noticed some drainage associated with some bleeding as well. The patient has a history of prior amputation of third and fourth digits, but now has developed an ulcer on the lateral aspect as well as plantar aspect of the mid ball of the foot. Denies any headache, no sore throat. No chest pain. No shortness of breath. No abdominal pain, nausea, vomiting, or diarrhea. His sugars had been running around 200. The patient has a history of previous gangrenous second and third toes and had received IV antibiotics and subsequent to that had interstitial nephritis and acute renal failure. He has also had bypass surgery at [facility] approximately 4 months back.   PAST MEDICAL HISTORY: Significant for type 2 diabetes with diabetic peripheral neuropathy, diabetic foot ulcers, chronic renal failure, hypertension, CAD, vitamin D deficiency, cardiomyopathy with an ejection fraction of 34% in September of 2014. Please see H and P for full details.   HOSPITAL COURSE:  The patient was admitted to [facility] and  started on intravenous Zosyn and vancomycin.  He was also seen in consultation by Dr. Vickki Muff and subsequently underwent a transmetatarsal amputation. MRI of the left foot had shown evidence of fifth metatarsal osteomyelitis with bony destruction of the metatarsal head, local fracturing and extensive edema signal throughout the fifth metatarsal and proximal minor and middle phalanges of the small toe. There was also evidence for myositis of the foot with extensive cellulitis, gas locules, tracking along the soft tissue of the ball of the foot, and suspected low grade osteomyelitis causing erosions of the head of the second metatarsal. The patient did have an elevation of his serum creatinine to 1.86 postoperative, but subsequently he started to trend downwards and it is 1.57 on day of discharge. He was also noted to be anemic with hemoglobin dropping to 6.6 and received 1 unit of packed red blood transfusion. The patient was seen by infectious disease consultant Dr. Ola Spurr who started him on intravenous Rocephin and recommended a 2 week course of IV Rocephin and also p.o. Flagyl. The patient was stable at the time of discharge and discharged to  [facility] for rehabilitation on the following medications.    DISCHARGE MEDICATIONS:   1.  Coreg 3.125 mg once a day.  2.  Atorvastatin 40 mg once a day.  3.  Aspirin 325 mg a day.  4.  Glimepiride 2 mg once a day.    5.  Iron 150 mg once a day.  6.  Ambien 5 mg p.o. at bedtime p.r.n.  7.  Oxycodone 5 mg 1 tablet q. 6 p.r.n. for pain.  8.  Metronidazole 5 mg every 8 hours for 14 days.  9.  Ceftriaxone 1 gram every 12 hours for 14 days.   DISCHARGE INSTRUCTIONS: The patient was advised to follow a strict low-carbohydrate diet. Wound care and management as per podiatry. The patient has been advised to follow up with me, Dr. Ginette Pitman, and also follow up with Dr. Vickki Muff, podiatrist, and also follow with Dr. Ola Spurr in 2-3 weeks time.   TOTAL TIME SPENT ON  DISCHARGING PATIENT: 35 minutes.    ____________________________ Tracie Harrier, MD vh:bu D: 08/28/2014 12:56:15 ET T: 08/28/2014 13:50:11 ET JOB#: [id number]  cc: Tracie Harrier, MD, <Dictator> Tracie Harrier MD ELECTRONICALLY SIGNED 09/11/2014 15:55

## 2015-03-09 NOTE — H&P (Signed)
PATIENT NAME:  [name] [name], [name] [name] MR#:  [id number] DATE OF BIRTH:  24-Mar-1961  DATE OF ADMISSION:  08/24/2014   CHIEF COMPLAINT: Low grade fever.   HISTORY OF PRESENT ILLNESS: A 54 year old gentleman with diabetes, heart disease, cardiomyopathy, peripheral vascular disease, chronic kidney disease, who presented with k week of low-grade fevers and general malaise. He has been followed closely by podiatry and the wound care team for a persistent left diabetic foot ulcer, slow to heal. He has had increasing soreness to the foot with some swelling and redness over the last week. His fever has been subjective. He presents to the clinic with some drainage from the left foot lateral wound. He has a prior history of amputation of the third and fourth digits. His ulcers are on the lateral aspect, as well as at the plantar surface under the mid ball of the foot. He denies any headache, head congestion, sore throat, ear pain. He denies any chest pain, cough, shortness of breath. He denies any abdominal pain, nausea, vomiting or diarrhea. He reports his bowels have been sluggish. He says his sugars have been running under 200. He has a history of gangrenous second and third toes last September, resulting in amputation. At that time he had IV antibiotics with subsequent interstitial nephritis and acute renal failure. His creatinine went above 5, but now has been running around 1.5. He was seen in consultation with nephrology, Dr. Candiss Norse. It is my understanding he also had biopsies performed, suggesting an antibiotic trigger to his acute renal failure. He also underwent echocardiogram during his hospitalization back in September of last year showing a diminished EF. He was diagnosed with cardiomyopathy. He underwent bypass surgery at [facility] approximately 4 months ago. Once again, he is not having any chest pain or shortness of breath now.   REVIEW OF SYSTEMS: As per HPI.   PAST MEDICAL HISTORY:  1.  Diabetes type 2 with  diabetic peripheral neuropathy and diabetic foot ulcers.  2.  Chronic renal failure. Suffered acute renal failure back in September 2014 due to interstitial nephritis.  3.  Hypertension.  4.  Coronary artery disease, with bypass surgery x 4 at [facility] in June 2015.  5.  Vitamin D deficiency.  6.  Cardiomyopathy. Echocardiogram in September 2014 with EF of 34%.   PAST SURGICAL HISTORY:  1.  Tonsillectomy.  2.  Cataract extraction.  3.  Toe amputation, left third and fourth digits, November 2014.  4.  Coronary artery bypass surgery.   CURRENT MEDICATIONS:  1.  Coreg 3.125 mg b.i.d.  2.  Lipitor 40 mg at bedtime.  3.  Aspirin 325 mg daily.  4.  Glimepiride 2 mg daily with breakfast.  5.  NovoLog insulin sliding scale.  6.  Ferrex iron supplement 150 mg daily.  7.  Ambien 5 mg at bedtime p.r.n.  8.  Vitamin B12 tablets 500 mcg 2 tablets daily.  9.  Flomax 0.4 mg daily.    ALLERGIES: UNASYN, POSSIBLE TRIGGER INTERSTITIAL NEPHRITIS.   PHYSICAL EXAMINATION:  GENERAL: No acute distress.  VITAL SIGNS: Blood pressure 130/70, pulse 90, weight is 233 pounds.  HEENT: Head is normocephalic, atraumatic. Pupils equal, round, and reactive to light. EOMs are intact. Sclerae non icteric. Noninjected conjunctiva. Oropharynx is clear with uvula midline. No erythema. Normal dentition. Ears are clear with normal landmarks. No erythema.  NECK: Supple, with no adenopathy. No thyromegaly.  LUNGS: Clear to auscultation bilaterally.  HEART: Regular rate and rhythm. No murmurs, rubs, or gallops.  ABDOMEN: Soft, nontender, positive bowel sounds.  EXTREMITIES: Left foot with prior amputation third and fourth digits. Erythema on the lateral aspect with ulcer at the fifth MTP joint with drainage, foul odor. Also plantar ulcer under the mid ball of the foot without drainage. Some mild swelling over the foot, and erythema around the lateral wound.  NEUROLOGIC: Nonfocal. Cranial nerves II through XII grossly  intact. Alert and oriented x 3.   SOCIAL HISTORY: Nonsmoker. No alcohol. He has 1 daughter; currently not married. He has worked as a Hotel manager.   FAMILY HISTORY: Mother with ovarian cancer. Father with chronic kidney disease and diabetes.   ASSESSMENT AND PLAN:  1.  Diabetic foot ulcer on left lateral foot, followed by Dr. Vickki Muff. We will consult. Initiate IV Zosyn and IV vancomycin. We will obtain wound cultures. Blood cultures x 2, as well. Also, CBC with METC sedimentation rate.  2.  Diabetes. We will hold Amaryl. Give Accu-Cheks before meals and at bedtime with sliding scale coverage.  3.  Coronary artery disease. History of bypass x 4 in June 2015. Continue Coreg. We will hold aspirin today.  4.  Hypertension. Continue Coreg.  5.  Hyperlipidemia. Continue Lipitor.  6.  Benign prostatic hypertrophy. We will hold Flomax today.     ____________________________ Marily Lente. Ozella Comins, PA-C rjt:MT D: 08/24/2014 13:06:56 ET T: 08/24/2014 13:41:04 ET JOB#: [id number]  cc: Marily Lente. Lolita Lenz, <Dictator> Leonard Downing PA ELECTRONICALLY SIGNED 09/03/2014 12:43

## 2015-03-09 NOTE — Op Note (Signed)
PATIENT NAME:  [name] [name], [name] [name] MR#:  [id number] DATE OF BIRTH:  07-28-1961  DATE OF PROCEDURE:  08/25/2014  PREOPERATIVE DIAGNOSIS: Left fifth and second metatarsophalangeal joint osteomyelitis.   POSTOPERATIVE DIAGNOSIS: Left fifth and second metatarsophalangeal joint osteomyelitis.   PROCEDURE: Transmetatarsal amputation of the left foot.   SURGEON: Aniqua Briere A. Vickki Muff, DPM  ANESTHESIA: General.   COMPLICATIONS: None.   SPECIMEN: Transmetatarsal amputation with wound culture of the fifth ray.   ESTIMATED BLOOD LOSS: 75 mL   HEMOSTASIS: None.   OPERATIVE INDICATIONS: This is a 54 year old gentleman with a history of peripheral vascular disease, diabetic neuropathy, with recent ulcerations to his left forefoot under the second and fifth MTPJs. He developed osteomyelitis with worsening cellulitis of his left forefoot. MRI showed osteomyelitis of the second MTPJ as well as the fifth MTPJ, with gas in the forefoot region. He was taken to the Operating Room today for a transmetatarsal amputation. The risks, benefits, alternatives, and complications associated with surgery were discussed with the patient, and informed consent has been given.   OPERATIVE PROCEDURE: The patient was brought into the Operating Room and placed on the operating table in the supine position. General intubation was administered by the anesthesia team. The left lower extremity was prepped and draped in the usual sterile fashion. Attention was directed to the left forefoot where a fishmouth incision was made from medial to lateral, across the dorsal and plantar aspect of the foot. Full-thickness flaps were made, dorsal and plantar. The incisions were made to approximately the mid metatarsal region. This time with a power saw, the transmetatarsal amputation was completed from medial to lateral. This forefoot was removed en toto. All bleeders were Bovie cauterized. The wound was flushed with copious amounts of irrigation with a  bulb syringe. Closure was then performed with a 3-0 nylon to the forefoot. An incisional Wound VAC was then placed along the incision site. Then 0.25% Marcaine was placed around all areas.   The patient tolerated the procedure and anesthesia well, and was transported from the OR to the PACU with all vital signs stable and neurovascular status intact. We will see him in the outpatient clinic upon discharge. He will be readmitted to the floor for continued IV antibiotics.    ____________________________ Pete Glatter Vickki Muff, DPM jaf:MT D: 08/25/2014 09:35:21 ET T: 08/25/2014 11:22:05 ET JOB#: [id number]  cc: Larkin Ina A. Vickki Muff, DPM, <Dictator> Amir Glaus DPM ELECTRONICALLY SIGNED 08/30/2014 12:38

## 2015-03-09 NOTE — Consult Note (Signed)
PATIENT NAME:  [name] [name], [name] [name] MR#:  [id number] DATE OF BIRTH:  [birth year]  DATE OF CONSULTATION:  08/27/2014  REFERRING PHYSICIAN:   CONSULTING PHYSICIAN:  Cheral Marker. Ola Spurr, MD  REQUESTING PHYSICIAN:  Dr. Ginette Pitman.    REASON FOR CONSULTATION: MSSA bacteremia and foot infection.   HISTORY OF PRESENT ILLNESS: This is a pleasant 54 year old gentleman with a history of diabetes, coronary artery disease, cardiomyopathy, peripheral vascular disease, chronic kidney disease, and prior AIN from antibiotic use, who presented with fevers and malaise. He had been following with podiatry. He was also getting hyperbaric oxygen for a persistent left diabetic foot ulcer. He then developed worsening redness and swelling and subjective fevers and drainage from the wound. He was admitted and ended up having MSSA bacteremia as well as MSSA growth from his foot. He underwent transmetatarsal amputation on October 10 by Dr. Vickki Muff. He currently has a Wound VAC in place. We are consulted for further antibiotic management. Currently he is on Zosyn.   PAST MEDICAL HISTORY:  1. Diabetes type 2 with peripheral neuropathy and diabetic foot ulcers.  2. Chronic renal failure. He had acute renal failure from AIN likely due to Unasyn in September 2014.  3. Hypertension.  4. Coronary artery disease status post bypass surgery in June 2015.  5. Vitamin D deficiency.  6. Cardiomyopathy, EF 30% in September 2014.   PAST SURGICAL HISTORY:  1. CABG.   2. Toe amputation left fourth and fifth digits November of 2014.  3. Cataract extraction.  4. Tonsillectomy.  5. Renal biopsy.   ALLERGIES: SHE IS LISTED AS BEING ALLERGIC TO UNASYN WHICH POSSIBLY TRIGGERED INTERSTITIAL NEPHRITIS.   SOCIAL HISTORY: The patient does not smoke or drink. He has a daughter. He worked as a Hotel manager in the past.   FAMILY HISTORY: Noncontributory.   REVIEW OF SYSTEMS: 11 systems reviewed and negative except as per HPI.   ANTIBIOTICS SINCE  ADMISSION: Include vancomycin and Zosyn. Currently he is on Zosyn alone.   PHYSICAL EXAMINATION:  VITAL SIGNS: Temperature 100.4 on October 11, currently 98.7, pulse 86, blood pressure 126/73, respirations 18, saturation 92% on room air.  GENERAL:  He is pleasant, disheveled, lying in bed, in no acute distress.  HEENT: Pupils equal, round, and reactive to light and accommodation. Extraocular movements are intact. Sclerae anicteric.  OROPHARYNX: Clear.  NECK: Supple.  HEART: Regular.  LUNGS: Clear.  ABDOMEN: Soft, nontender, nondistended. No hepatosplenomegaly.  EXTREMITIES: Left leg is post transmetatarsal amputation. He has a Wound VAC in place. There is some surrounding tenderness.  NEUROLOGIC: He is alert and oriented x 3. Grossly nonfocal neurologic exam.   LABORATORY DATA: Blood cultures October 9 grew 1 of 2 with Staph aureus, methicillin sensitive. Wound culture October 9 grew heavy growth of Staph aureus, withholding for possible anaerobes. This was methicillin sensitive Staph aureus. October 10 culture from the left foot during surgery ulcer grew moderate growth of Staph aureus and holding for possible anaerobes. There were also many gram-negative rods identified. The methicillin sensitive Staph aureus was resistant to clindamycin. White blood count on admission was 10.3. It elevated to 11.9, is now 7.1. Sedimentation rate was quite elevated at greater than 140. Renal function on admission showed a creatinine of 1.48. It peaked at 1.86, but is currently 1.68 with an estimated GFR of 46. Hemoglobin is 8.5, platelets 252,000.   IMAGING: MRI of the left foot showed fifth metatarsal osteomyelitis with bony destruction of metatarsal head, extensive edema throughout the fifth metatarsal and proximal minor  and middle phalanges of the small toes, suspected low-grade osteomyelitis causing erosions in the head of the second metatarsal. There is myositis in the foot and extensive cellulitis especially  laterally. There were gas locules along the soft tissue of the ball of the foot.   IMPRESSION: A 54 year old gentleman with history of diabetes complicated by peripheral vascular disease and neuropathy, admitted with methicillin-sensitive Staphylococcus aureus bacteremia and foot infection. He is now status post transmetatarsal amputation by Dr. Vickki Muff on October 10. Cultures of the foot are growing methicillin-sensitive Staphylococcus aureus but also are being held for possible anaerobes. Gram-negative rods were noted on the gram stain, but no gram-negatives have grown at this point. He has a complicated history in that he had acute interstitial nephritis, biopsy proven, in 2014 presumably due to Unasyn. At that time his creatinine peaked around 5. I am hesitant to use any penicillins in him given this issue with the prior acute interstitial nephritis. However his creatinine has remained stable this hospitalization and appears to be at his baseline. I did discuss it with Dr. Candiss Norse. He has seen him as an outpatient, but the last time was in April of 2015.   RECOMMENDATIONS:  1.  I am repeating blood cultures to insure clearance of the Staph aureus bacteremia.  2.  Check an echocardiogram due to the Staph aureus bacteremia and concern for endocarditis, although I think this is unlikely. If the TTE is negative for vegetations I would not proceed with a TEE in this patient as he will likely need prolonged antibiotic treatment for the foot.  3.  I have discontinued the Zosyn due to the concern for acute interstitial nephritis. Since this was methicillin sensitive Staphylococcus aureus we can treat with ceftriaxone 1 gram q. 12 hours for the MSSA infection.  I am also going to add Flagyl 500 mg q. 8 hour for possible anaerobic infection.  4.  We will closely follow his renal function. After discussion with Dr. Candiss Norse I am going to check urine eosinophils today to insure we have not had any recurrence of the  interstitial nephritis with the Zosyn use recently.  5.  I would suggest at least a 2 week IV antibiotic course from the time of surgery, October 10. Depending on how the wound heals we may be able to switch him to oral antibiotics or decide to continue this at that time. I will see him in followup in 2 weeks time.  6.  Thank you for the consult. I will be glad to follow with you.      ____________________________ Cheral Marker. Ola Spurr, MD dpf:bu D: 08/27/2014 16:58:10 ET T: 08/27/2014 17:31:50 ET JOB#: [id number]  cc: Cheral Marker. Ola Spurr, MD, <Dictator> DAVID Ola Spurr MD ELECTRONICALLY SIGNED 08/28/2014 13:30

## 2015-04-27 IMAGING — CR DG CHEST 1V PORT
1 series · 1 of 1 positions shown · non-contrast
Comparison: 05/09/2014

CLINICAL DATA: Status post cardiac surgery

EXAM:
PORTABLE CHEST - 1 VIEW

[AP]
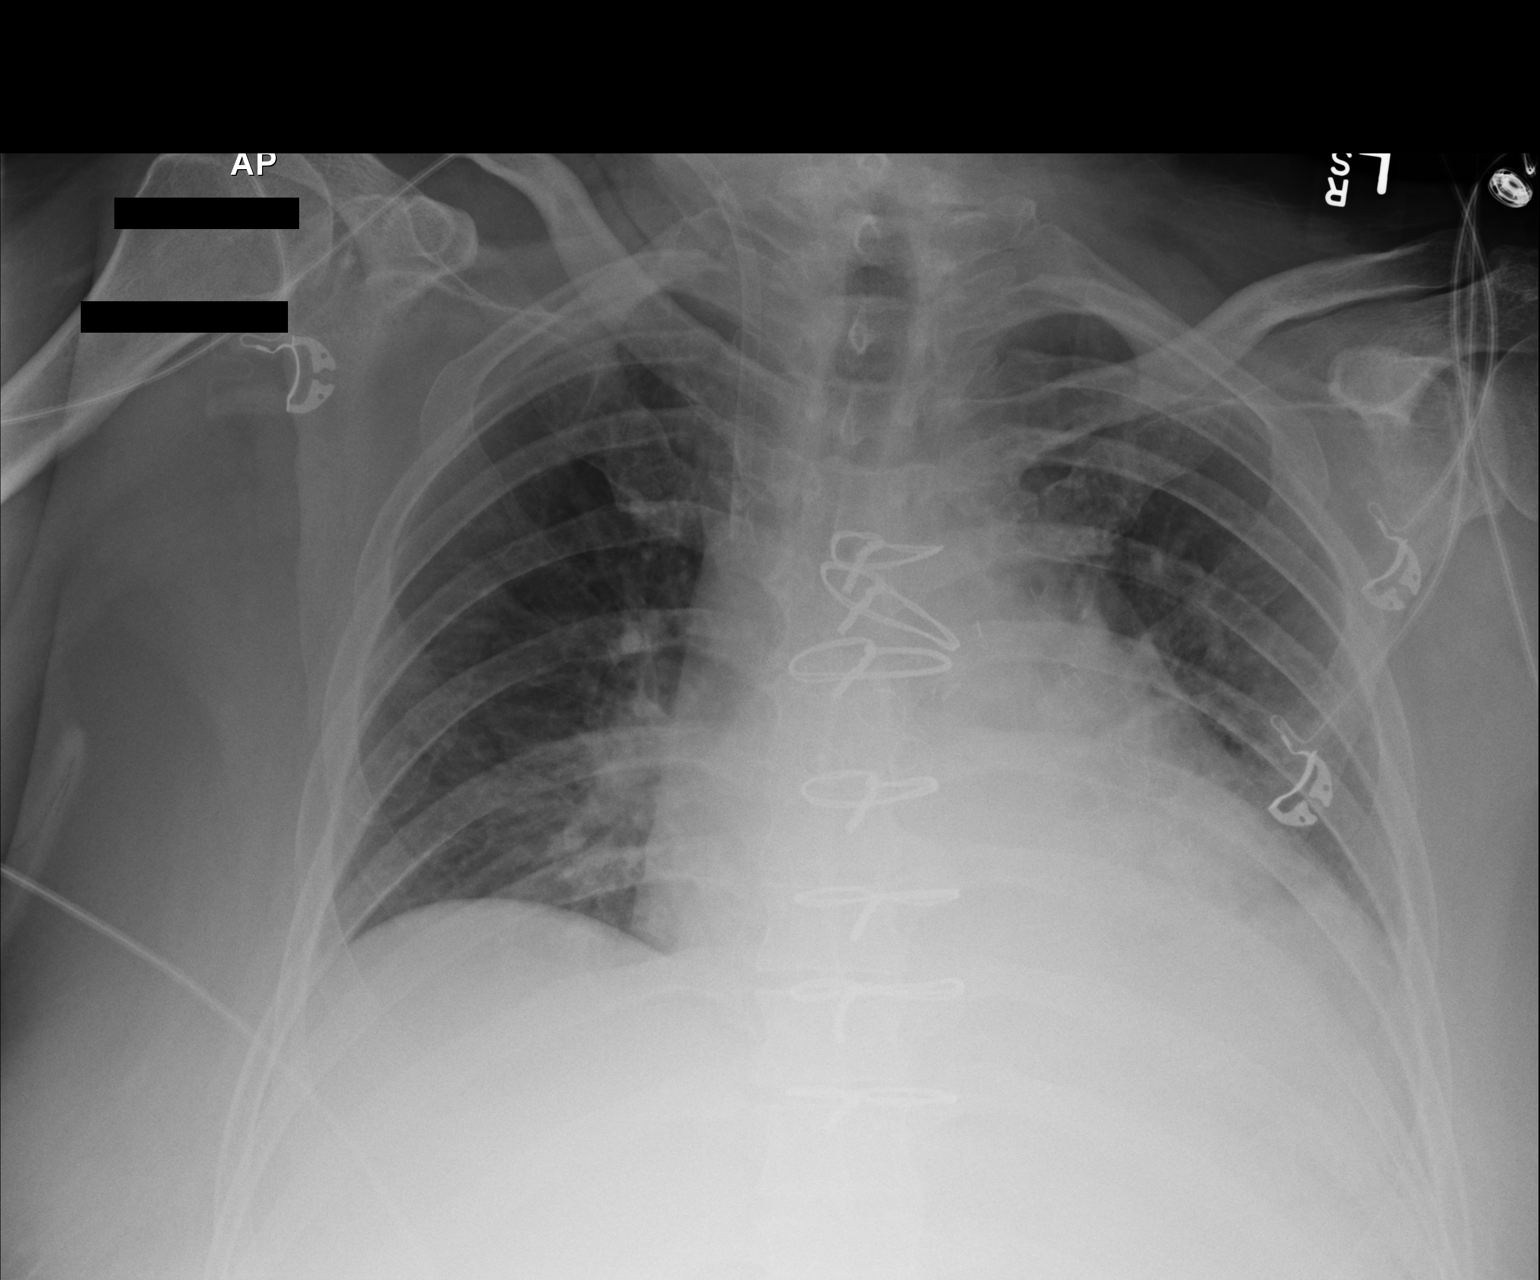

[1 of 1 positions shown; findings below may reference images not displayed]

FINDINGS: A right jugular sheath remains in satisfactory position.
Postsurgical changes are noted. The cardiac shadow is stable. The
overall inspiratory effort is poor. Persistent left basilar
atelectasis is noted. No bony abnormality is noted.
IMPRESSION: Persistent left basilar atelectasis.

## 2015-05-01 IMAGING — CR DG CHEST 2V
2 series · 2 of 2 positions shown · non-contrast
Comparison: May 12, 2014

CLINICAL DATA: Coronary artery disease

EXAM:
CHEST  2 VIEW

[x chest ap]
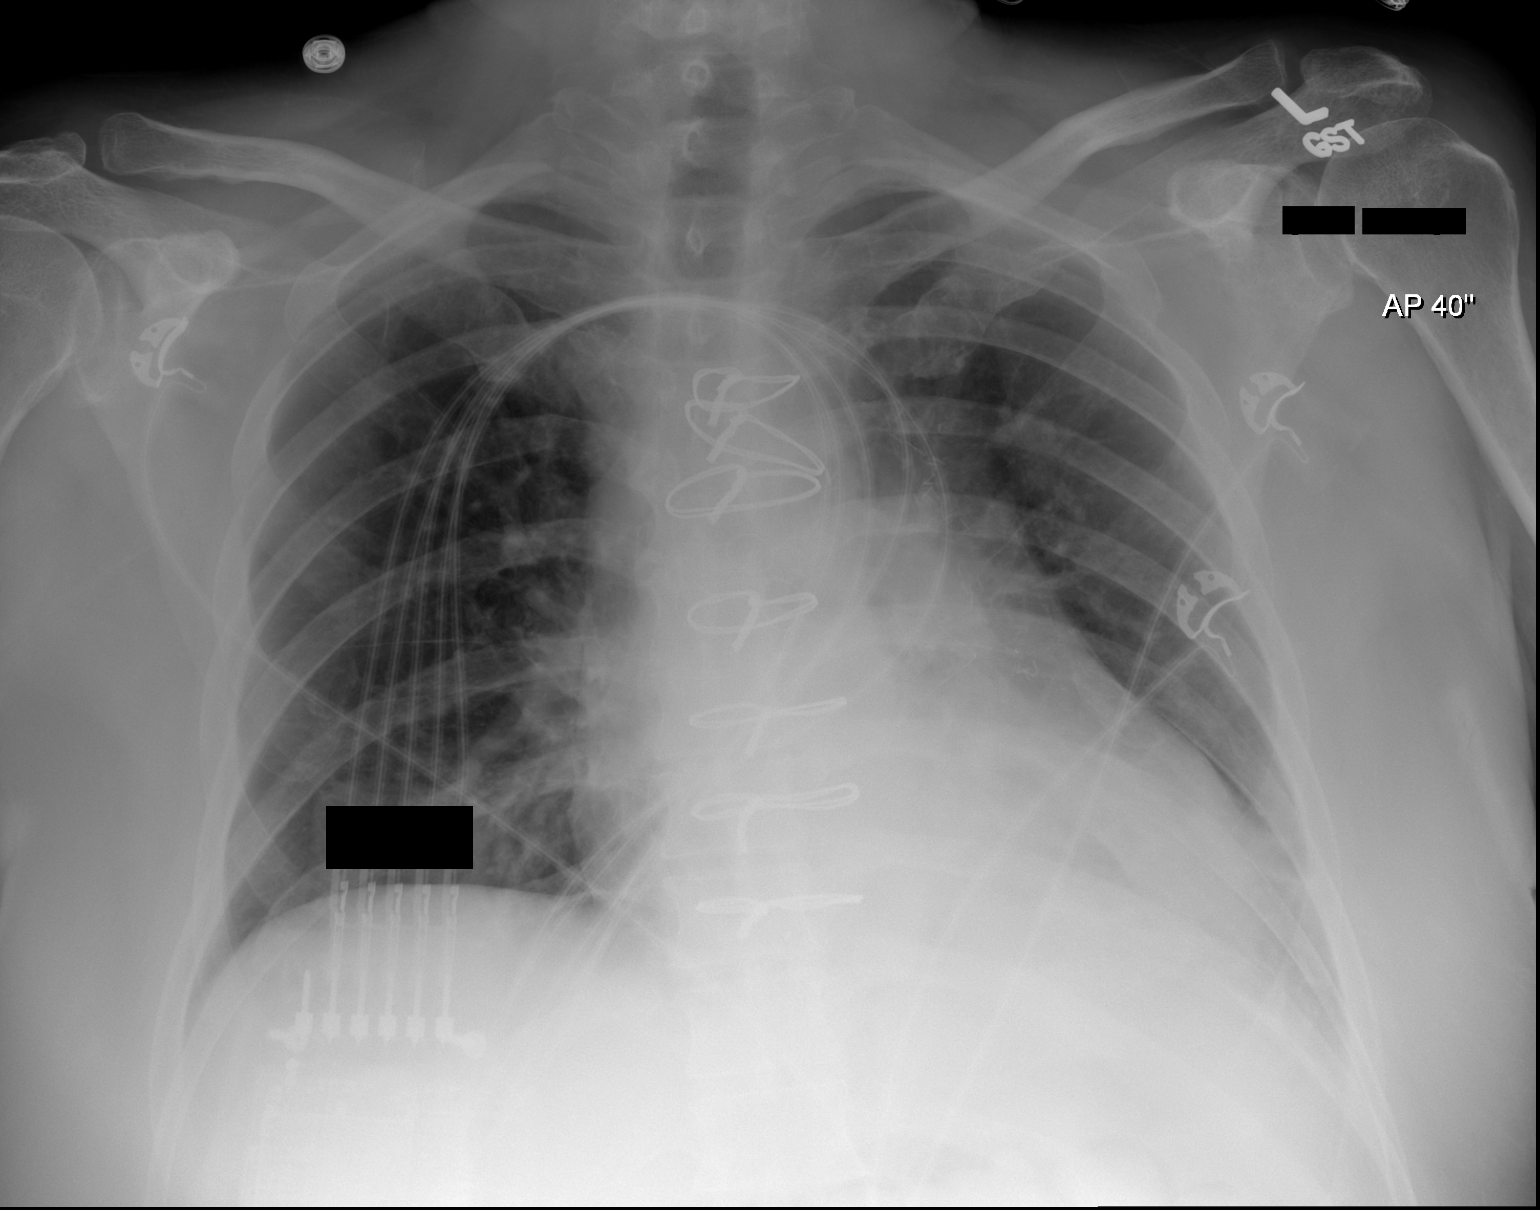

[w chest lat]
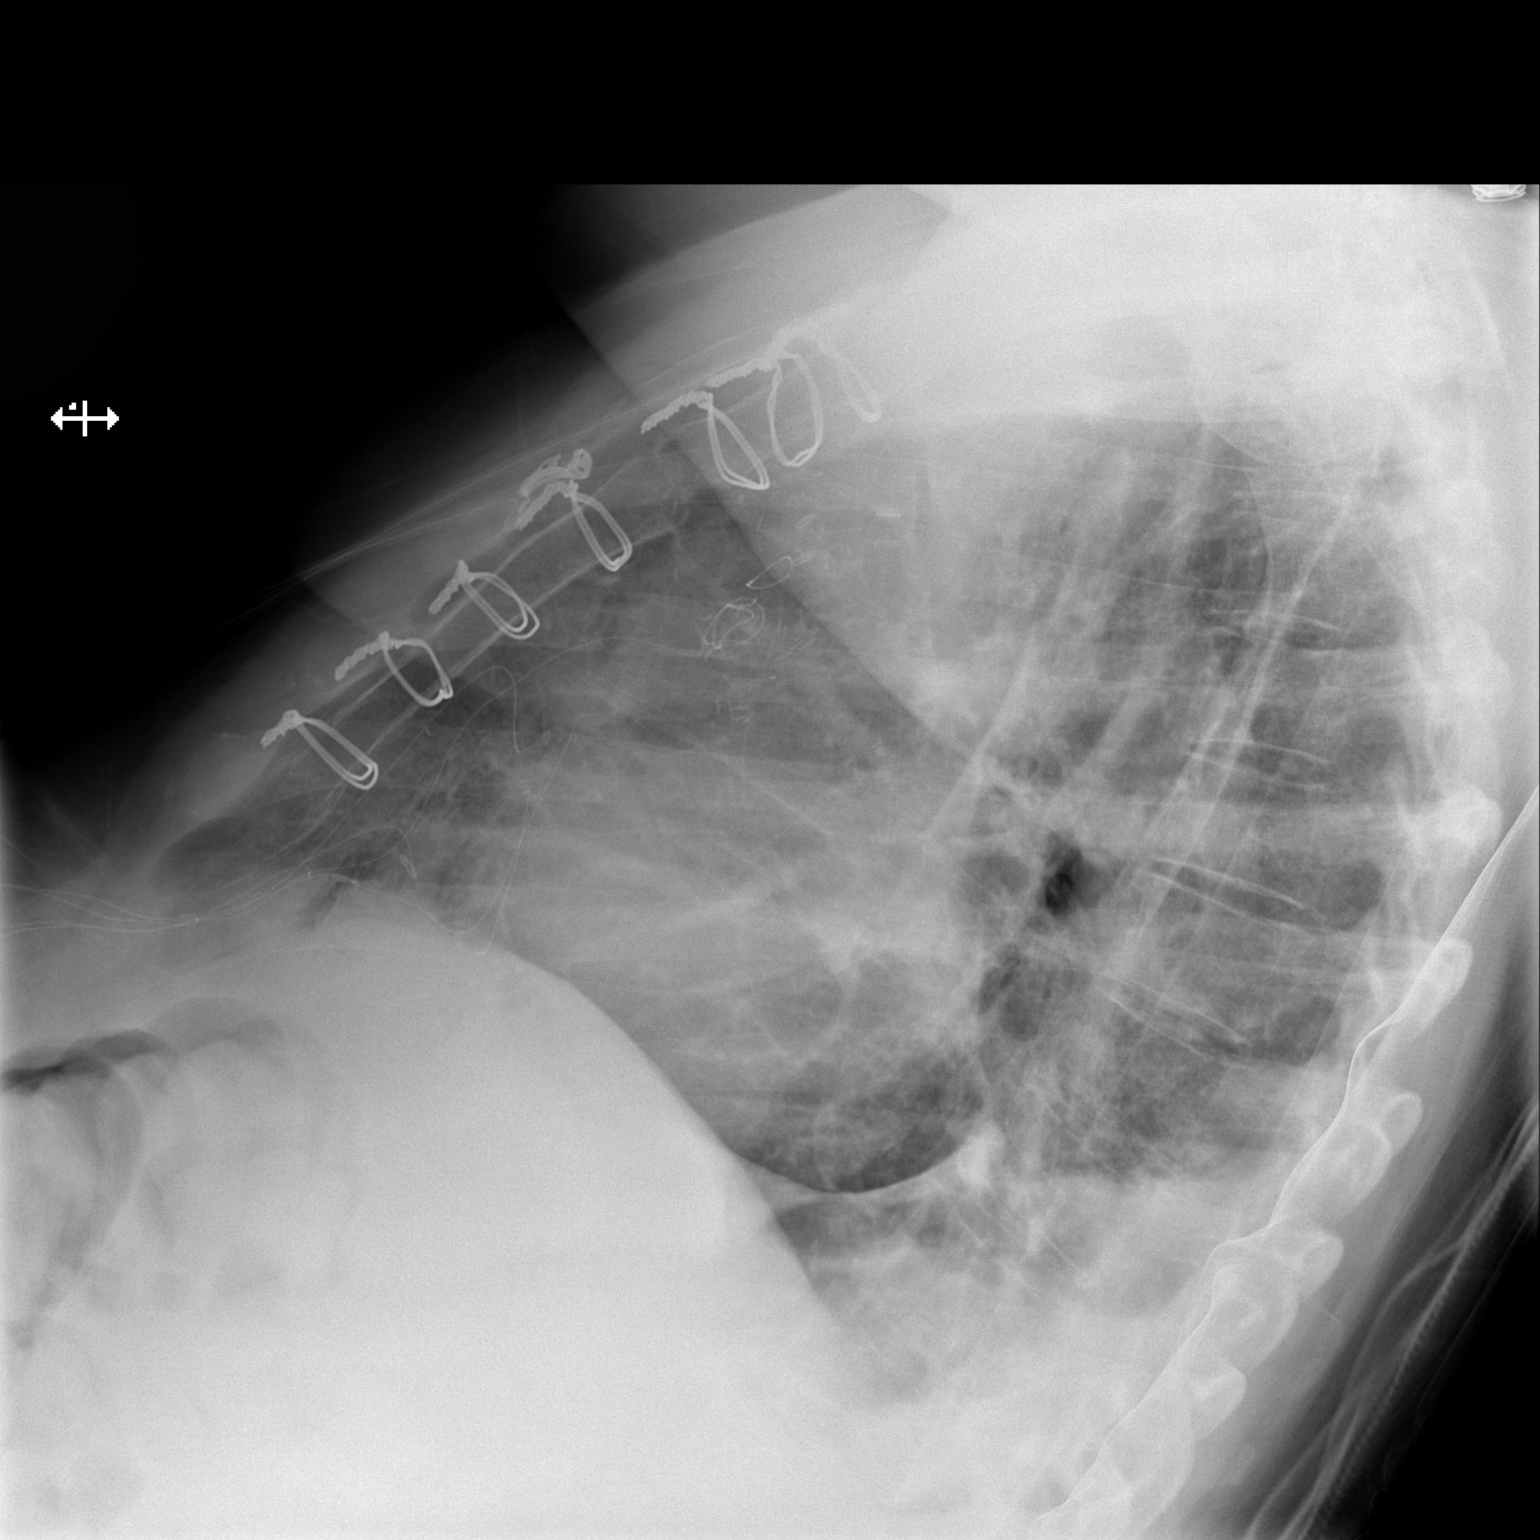

[2 of 2 positions shown; findings below may reference images not displayed]

FINDINGS: There is persistent patchy left lower lobe consolidation with small
left effusion. There is slightly more consolidation in the left base
compared to recent prior study. Elsewhere lungs are clear. Heart is
mildly enlarged with normal pulmonary vascularity. No pneumothorax.
No adenopathy. Patient is status post coronary artery bypass
grafting.
IMPRESSION: Persistent left base consolidation with small left effusion.
Elsewhere lungs clear. There is slightly more left base opacity
compared to recent prior study.

## 2015-05-05 IMAGING — CR DG CHEST 2V
2 series · 2 of 2 positions shown · non-contrast
Comparison: 05/14/2014

CLINICAL DATA: Fever and UTIs

EXAM:
CHEST  2 VIEW

[w chest lat]
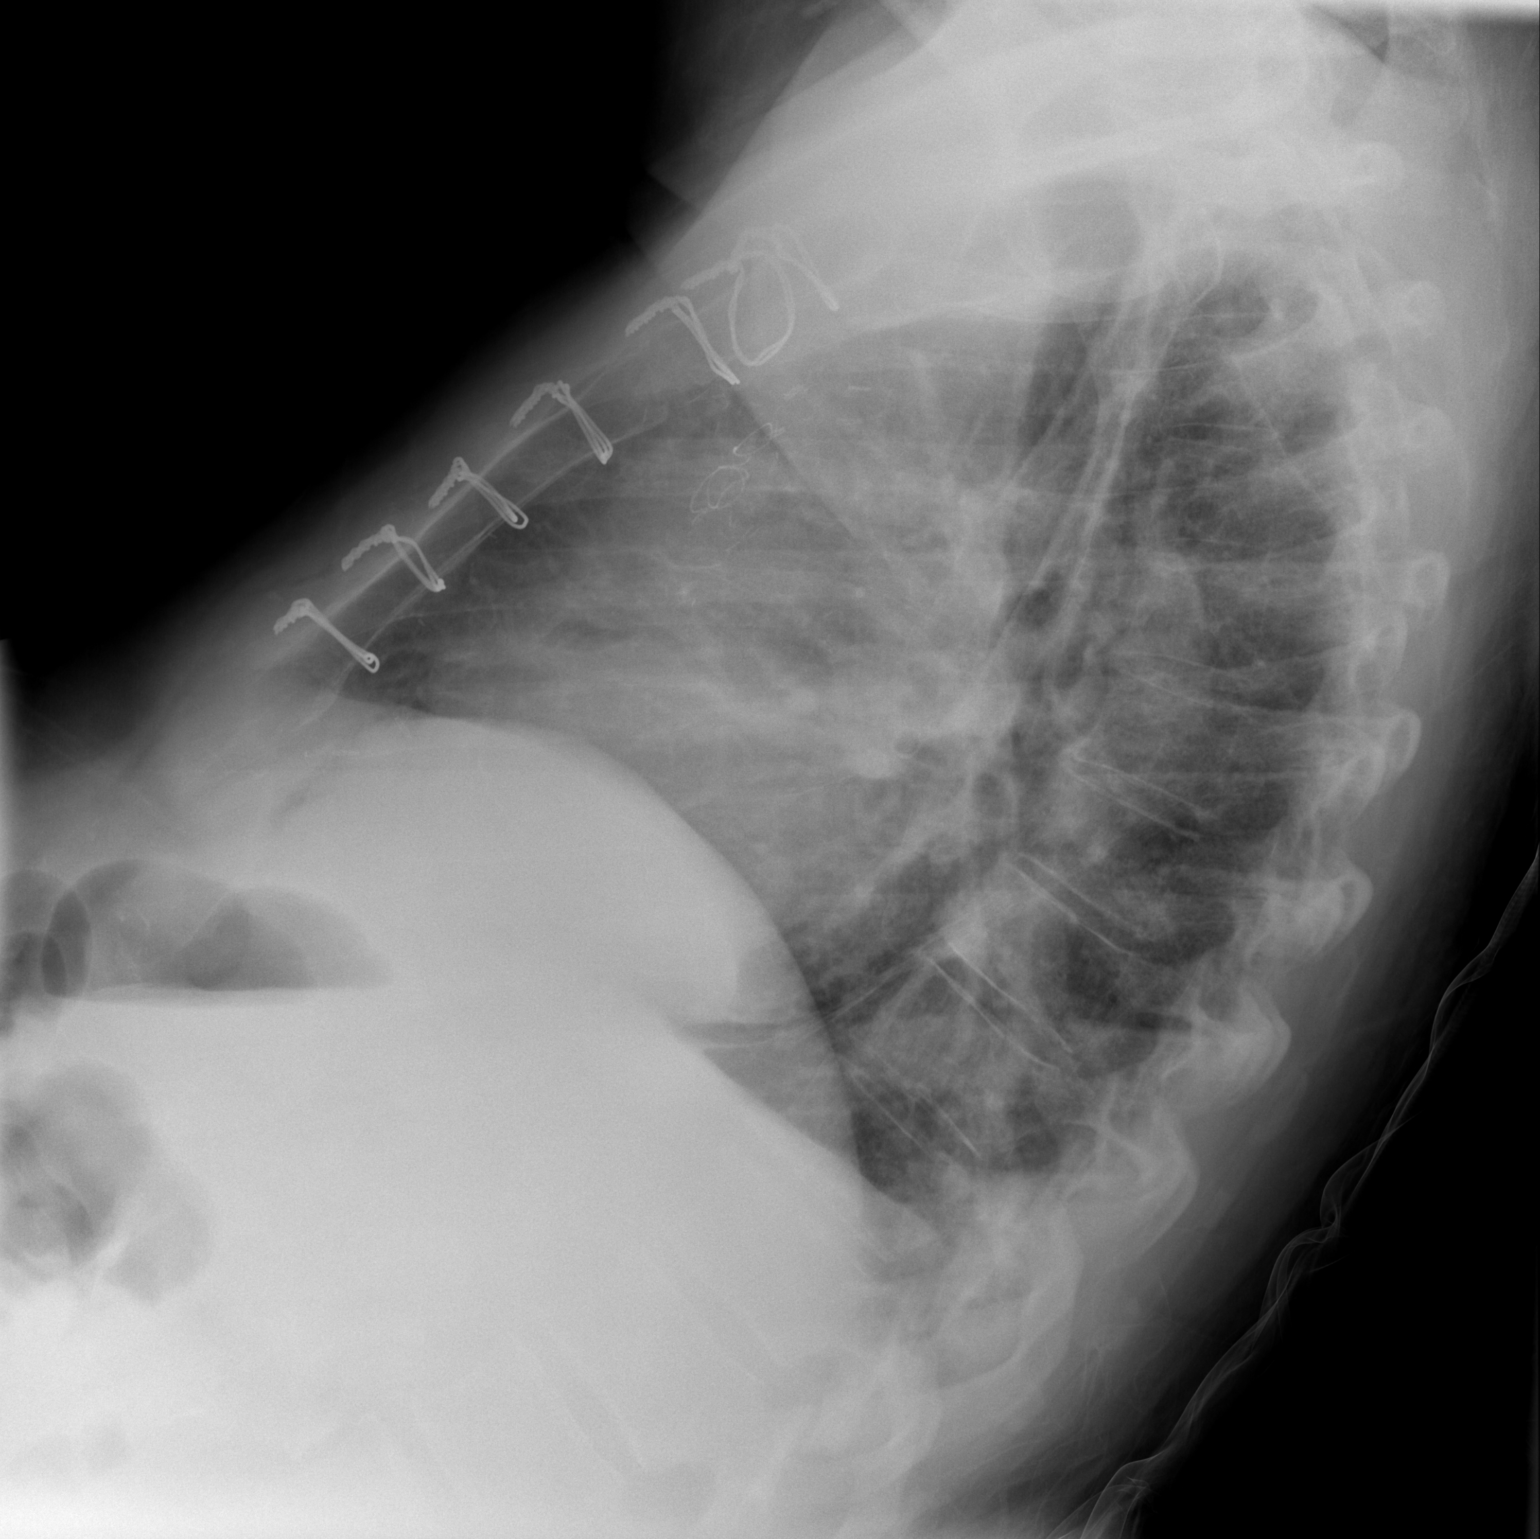

[w chest pa]
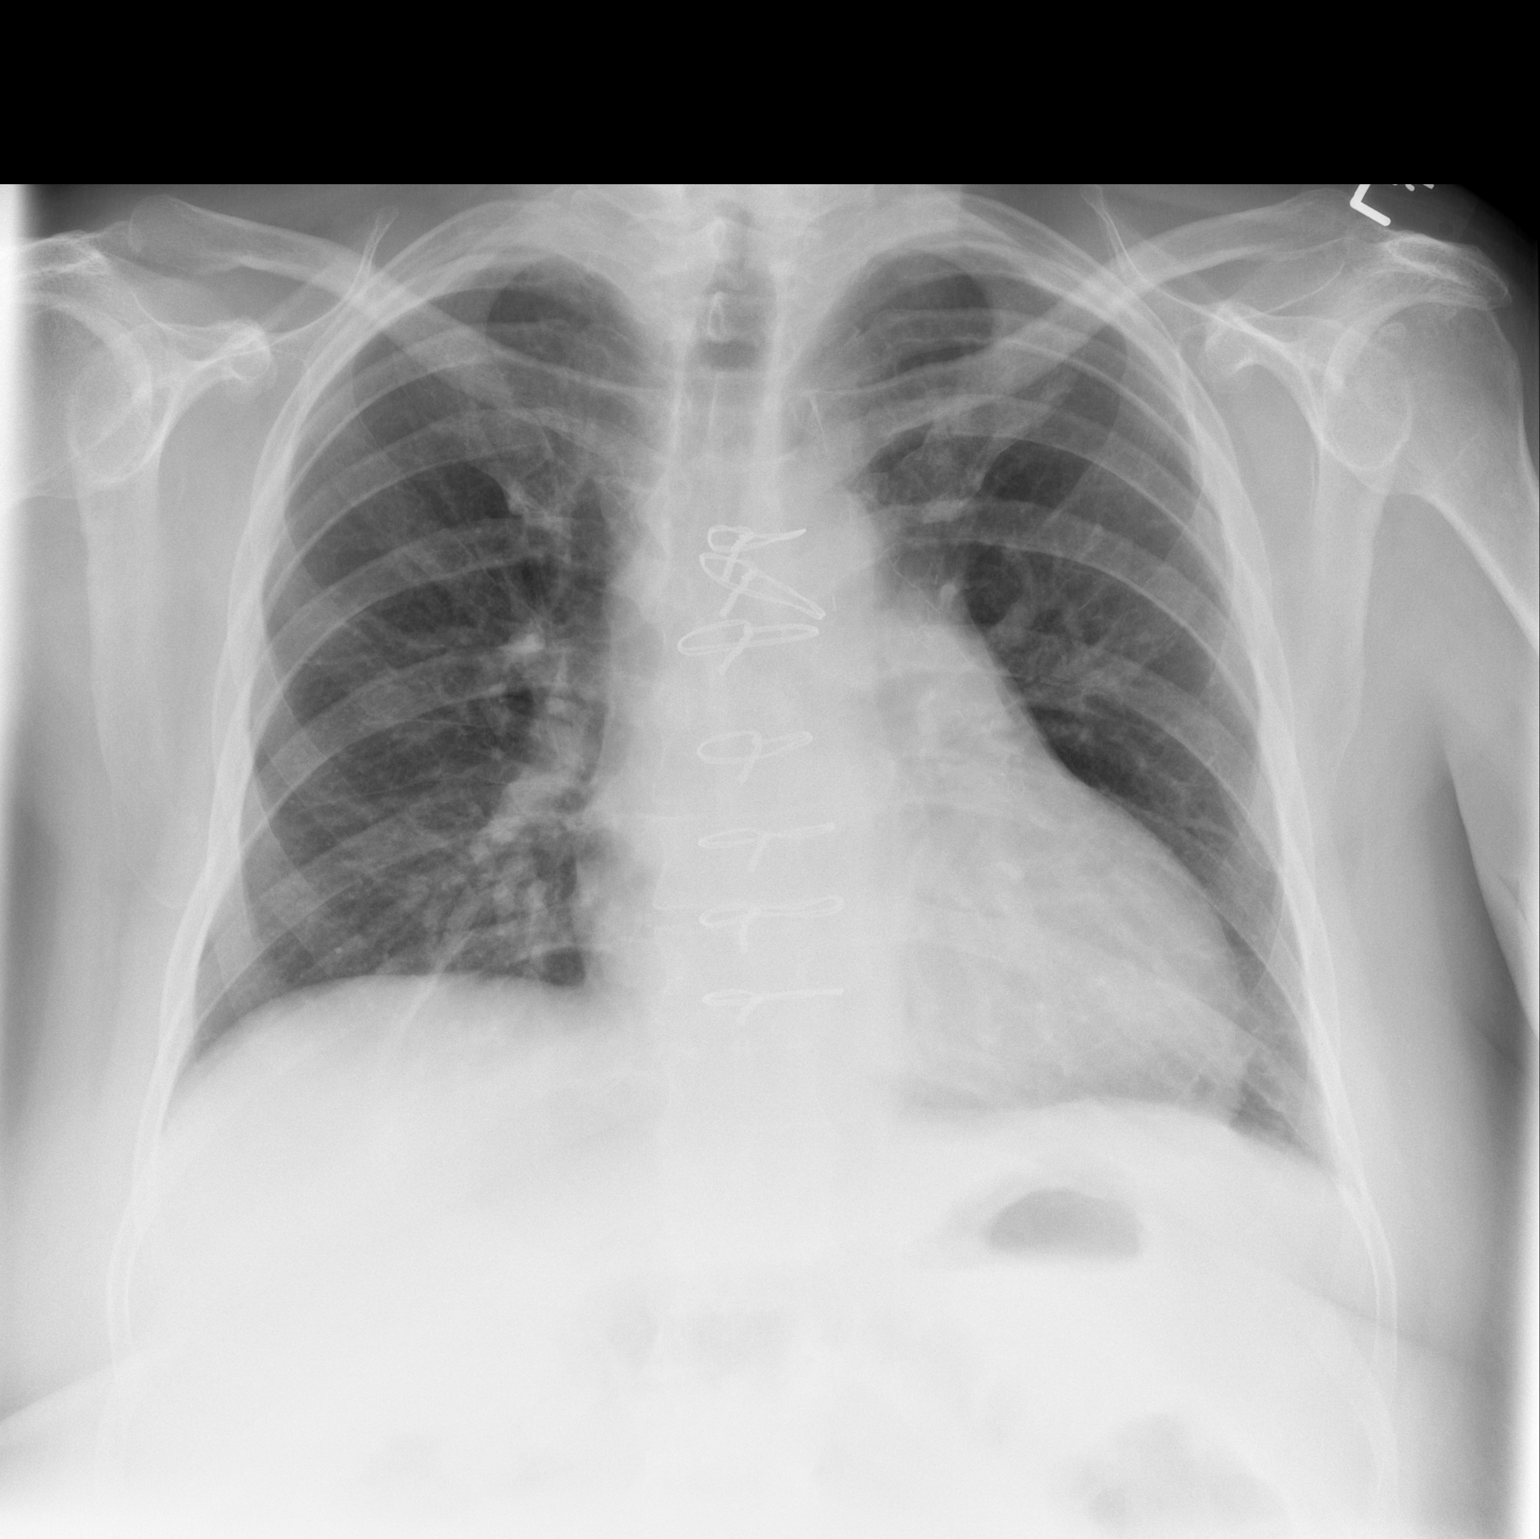

[2 of 2 positions shown; findings below may reference images not displayed]

FINDINGS: Cardiac shadow is mildly enlarged. Postsurgical changes are again
seen. No focal infiltrate or sizable effusion is noted. No bony
abnormality is seen.
IMPRESSION: No acute abnormality noted.

## 2015-08-30 ENCOUNTER — Telehealth: Payer: Self-pay | Admitting: Cardiovascular Disease

## 2015-08-30 NOTE — Telephone Encounter (Signed)
3 attempts made to call and schedule over due appt from recall.  lmov for patient to call office for scheduling.  Deleting recall.

## 2015-11-04 ENCOUNTER — Other Ambulatory Visit: Payer: Self-pay | Admitting: Cardiovascular Disease

## 2015-12-19 ENCOUNTER — Telehealth: Payer: Self-pay | Admitting: *Deleted

## 2015-12-19 DIAGNOSIS — I429 Cardiomyopathy, unspecified: Secondary | ICD-10-CM | POA: Diagnosis not present

## 2015-12-19 DIAGNOSIS — I25810 Atherosclerosis of coronary artery bypass graft(s) without angina pectoris: Secondary | ICD-10-CM | POA: Diagnosis not present

## 2015-12-19 DIAGNOSIS — L97509 Non-pressure chronic ulcer of other part of unspecified foot with unspecified severity: Secondary | ICD-10-CM | POA: Diagnosis not present

## 2015-12-19 DIAGNOSIS — E11621 Type 2 diabetes mellitus with foot ulcer: Secondary | ICD-10-CM | POA: Diagnosis not present

## 2015-12-19 DIAGNOSIS — I10 Essential (primary) hypertension: Secondary | ICD-10-CM | POA: Diagnosis not present

## 2015-12-19 NOTE — Telephone Encounter (Signed)
Pt is overdue for an appointment and was

## 2015-12-23 DIAGNOSIS — L97509 Non-pressure chronic ulcer of other part of unspecified foot with unspecified severity: Secondary | ICD-10-CM | POA: Diagnosis not present

## 2015-12-23 DIAGNOSIS — I25810 Atherosclerosis of coronary artery bypass graft(s) without angina pectoris: Secondary | ICD-10-CM | POA: Diagnosis not present

## 2015-12-23 DIAGNOSIS — I255 Ischemic cardiomyopathy: Secondary | ICD-10-CM | POA: Diagnosis not present

## 2015-12-23 DIAGNOSIS — E11621 Type 2 diabetes mellitus with foot ulcer: Secondary | ICD-10-CM | POA: Diagnosis not present

## 2015-12-23 DIAGNOSIS — E786 Lipoprotein deficiency: Secondary | ICD-10-CM | POA: Diagnosis not present

## 2015-12-23 DIAGNOSIS — N183 Chronic kidney disease, stage 3 (moderate): Secondary | ICD-10-CM | POA: Diagnosis not present

## 2015-12-23 DIAGNOSIS — Z794 Long term (current) use of insulin: Secondary | ICD-10-CM | POA: Diagnosis not present

## 2015-12-24 ENCOUNTER — Encounter: Payer: Self-pay | Admitting: Nurse Practitioner

## 2015-12-24 ENCOUNTER — Encounter (INDEPENDENT_AMBULATORY_CARE_PROVIDER_SITE_OTHER): Payer: Self-pay

## 2015-12-24 ENCOUNTER — Ambulatory Visit (INDEPENDENT_AMBULATORY_CARE_PROVIDER_SITE_OTHER): Payer: Commercial Managed Care - HMO | Admitting: Nurse Practitioner

## 2015-12-24 VITALS — BP 136/82 | HR 77 | Ht 73.0 in | Wt 273.0 lb

## 2015-12-24 DIAGNOSIS — I119 Hypertensive heart disease without heart failure: Secondary | ICD-10-CM | POA: Insufficient documentation

## 2015-12-24 DIAGNOSIS — I2510 Atherosclerotic heart disease of native coronary artery without angina pectoris: Secondary | ICD-10-CM | POA: Diagnosis not present

## 2015-12-24 DIAGNOSIS — I110 Hypertensive heart disease with heart failure: Secondary | ICD-10-CM

## 2015-12-24 DIAGNOSIS — I5022 Chronic systolic (congestive) heart failure: Secondary | ICD-10-CM | POA: Diagnosis not present

## 2015-12-24 DIAGNOSIS — I25709 Atherosclerosis of coronary artery bypass graft(s), unspecified, with unspecified angina pectoris: Secondary | ICD-10-CM

## 2015-12-24 DIAGNOSIS — E1122 Type 2 diabetes mellitus with diabetic chronic kidney disease: Secondary | ICD-10-CM

## 2015-12-24 DIAGNOSIS — I255 Ischemic cardiomyopathy: Secondary | ICD-10-CM | POA: Diagnosis not present

## 2015-12-24 DIAGNOSIS — E7800 Pure hypercholesterolemia, unspecified: Secondary | ICD-10-CM | POA: Diagnosis not present

## 2015-12-24 DIAGNOSIS — N183 Chronic kidney disease, stage 3 unspecified: Secondary | ICD-10-CM

## 2015-12-24 DIAGNOSIS — Z794 Long term (current) use of insulin: Secondary | ICD-10-CM

## 2015-12-24 DIAGNOSIS — R0789 Other chest pain: Secondary | ICD-10-CM | POA: Diagnosis not present

## 2015-12-24 MED ORDER — CARVEDILOL 6.25 MG PO TABS: 6.2500 mg | ORAL_TABLET | Freq: Two times a day (BID) | ORAL | Status: DC

## 2015-12-24 MED ORDER — ATORVASTATIN CALCIUM 40 MG PO TABS: ORAL_TABLET | ORAL | Status: DC

## 2015-12-24 MED ORDER — NITROGLYCERIN 0.4 MG SL SUBL: 0.4000 mg | SUBLINGUAL_TABLET | SUBLINGUAL | Status: DC | PRN

## 2015-12-24 NOTE — Patient Instructions (Signed)
Medication Instructions:  Your physician has recommended you make the following change in your medication:  YOU MAY take sublingual nitro every 5 minutes as needed for chest pain. If you still experience symptoms after 3 doses, please go to ER to be evaluated   Labwork: none  Testing/Procedures: none  Follow-Up: Your physician wants you to follow-up in: six months with Dr. Rockey Situ.  You will receive a reminder letter in the mail two months in advance. If you don't receive a letter, please call our office to schedule the follow-up appointment.   Any Other Special Instructions Will Be Listed Below (If Applicable).     If you need a refill on your cardiac medications before your next appointment, please call your pharmacy.

## 2015-12-24 NOTE — Progress Notes (Signed)
Office Visit    Patient Name: [name] [name] Date of Encounter: 12/24/2015  Primary Care Provider:  Tracie Harrier, MD Primary Cardiologist:  Johnny Bridge, MD   Chief Complaint    55 year old male with history of coronary artery disease status post bypass in 2015, who presents for follow-up.  Past Medical History    Past Medical History  Diagnosis Date  . CAD (coronary artery disease)     a. 04/2014 CABG x 4 (LIMA->LAD, VG->Diag, VG->OM, VG->PDA).  . Ischemic cardiomyopathy     a. 07/2013 Echo: EF 20%; b. 07/2014 Echo: EF 30-35%, diff HK, Gr1 DD, mildly dil LA, nl PASP.  Marland Kitchen Chronic systolic CHF (congestive heart failure) ([place])     a. 07/2014 Echo: EF 30-35%.  . Type II diabetes mellitus ([place])     a. 12/2015 HbA1c = 13.9.  . Hypertensive heart disease   . Peripheral vascular disease ([place])   . Diabetic foot ulcers ([place])     a. left foot 2nd digit ant 5 th digit 05/03/14; b. 08/2014 s/p amputation of toes on L foot.  . Hypercholesterolemia     a. 12/2015 TC 116, TG 154, HDL 24, LDL 61-->Atorvastatin 40.  Marland Kitchen CKD (chronic kidney disease), stage III     a. 12/2015 Creat 1.8.  . Neuropathy in diabetes [facility])    Past Surgical History  Procedure Laterality Date  . Left foot osteomyelitis and wound after surgery    . Tonsilectomy/adenoidectomy with myringotomy    . Cataract extraction    . Toe amputation      Left 3 and 4 toes  . Coronary artery bypass graft N/A 05/07/2014    Procedure: CORONARY ARTERY BYPASS GRAFTING (CABG);  Surgeon: Gaye Pollack, MD;  Location: [place];  Service: Open Heart Surgery;  Laterality: N/A;  Times 4 using left internal mammary artery and endoscopically harvested right saphenous vein  . Intraoperative transesophageal echocardiogram N/A 05/07/2014    Procedure: INTRAOPERATIVE TRANSESOPHAGEAL ECHOCARDIOGRAM;  Surgeon: Gaye Pollack, MD;  Location[facility] OR;  Service: Open Heart Surgery;  Laterality: N/A;  . Cardiac catheterization      03/2014     Allergies  Allergies  Allergen Reactions  . Other     Pt. And wife state that he had a reaction to "some" antibiotic but they do not know what the name of it was.They are are very apprehensive about him receiving any antibiotic.    History of Present Illness    55 year old male with the above complex past medical history. He has a history of diabetes, hypertension, hyperlipidemia, morbid obesity, stage III chronic kidney disease, and also coronary artery disease status post four-vessel bypass in June 2015.  Last documented EF was 30-35% in September 2015.  He was last seen here in clinic in November 2015. Since that time, he reports that he has done reasonably well from a cardiac standpoint. He has gained some weight and also came off of his insulin. He recently followed up with his primary care provider and was found to have a hemoglobin A1c of 13.9. He is now back on insulin and oral Amaryl. He realizes that he needs to improve his health and has begun exercising at a local gym. He is riding an exercise bicycle for 30 minutes 5 days a week. In doing so, he has not been having any chest pain or significant dyspnea on exertion. Further, he denies PND, orthopnea, dizziness, syncope, edema, or early satiety.  Home Medications  Prior to Admission medications   Medication Sig Start Date End Date Taking? Authorizing Provider  aspirin 81 MG tablet Take 81 mg by mouth daily.   Yes Historical Provider, MD  atorvastatin (LIPITOR) 40 MG tablet TAKE ONE TABLET BY MOUTH ONCE DAILY AT  6PM 12/24/15  Yes Rogelia Mire, NP  carvedilol (COREG) 6.25 MG tablet Take 1 tablet (6.25 mg total) by mouth 2 (two) times daily with a meal. 12/24/15  Yes Rogelia Mire, NP  Ferrous Sulfate (IRON) 325 (65 FE) MG TABS Take two tablets twice a day.   Yes Historical Provider, MD  glimepiride (AMARYL) 4 MG tablet Take 1 tablet (2 mg total) BID 05/25/14  Yes Ivan Anchors Love, PA-C  insulin aspart (NOVOLOG) 100 UNIT/ML  FlexPen As per Sliding scale 12/23/15  Yes Historical Provider, MD  losartan (COZAAR) 50 MG tablet Take 50 mg by mouth daily.  12/04/15 12/03/16 Yes Historical Provider, MD  zolpidem (AMBIEN) 5 MG tablet Take 5 mg by mouth at bedtime as needed for sleep.   Yes Historical Provider, MD  nitroGLYCERIN (NITROSTAT) 0.4 MG SL tablet Place 1 tablet (0.4 mg total) under the tongue every 5 (five) minutes as needed for chest pain. 12/24/15   Rogelia Mire, NP    Review of Systems    He has been exercising without significant limitations.  He denies chest pain, palpitations, dyspnea, pnd, orthopnea, n, v, dizziness, syncope, edema, weight gain, or early satiety.  All other systems reviewed and are otherwise negative except as noted above.  Physical Exam    VS:  BP 136/82 mmHg  Pulse 77  Ht 6\' 1"  (1.854 m)  Wt 273 lb (123.832 kg)  BMI 36.03 kg/m2 , BMI Body mass index is 36.03 kg/(m^2). GEN: Obese in no acute distress. HEENT: normal. Neck: Obese, difficult to gauge JVP, no carotid bruits, or masses. Cardiac: RRR, no murmurs, rubs, or gallops. No clubbing, cyanosis, edema.  Radials/DP/PT 2+ and equal bilaterally.  Respiratory:  Respirations regular and unlabored, clear to auscultation bilaterally. GI: Soft, nontender, nondistended, BS + x 4. MS: no deformity or atrophy. Skin: warm and dry, no rash. Neuro:  Strength and sensation are intact. Psych: Normal affect.  Accessory Clinical Findings    ECG - regular sinus rhythm, 75, inferior infarct, lateral T-wave inversion, delayed R-wave progression-no acute ST or T changes.  Assessment & Plan    1.  Coronary artery disease status post coronary artery bypass grafting: Patient is status post CABG 4 in June 2015. He has been doing reasonably well from a cardiac standpoint though he has not been managing his risk factors well. His weight is up and his hemoglobin A1c was recently found to be 13.9. In that setting, he has been inspired to exercise and  lose weight. He is currently going to the gym about 5 times a week and riding an exercise bicycle for 30 minutes without significant limitations, chest pain, or dyspnea. He remains on aspirin, statin, beta blocker, and ARB therapy.  2. Ischemic cardiopathy/chronic systolic congestive heart failure: Patient is euvolemic on exam. It sounds as though he has been doing reasonably well at home from a volume standpoint. His weight is up overall but this is attributed to inactivity and deconditioning. Last documented EF was 30-35% in September 2015, approximately 3 months postoperatively. He has been on beta blocker and ARB therapy since that time. I recommended repeat echocardiogram at this time to reevaluate LV function and if he remains below 35%, I would  refer him to electrophysiology for consideration of AICD placement. At this time, he is not interested in either the echo or the referral. He would like to continue exercising to see if there is any improvement in his functional status. He may be interested in repeating his echo at some point in the future but clearly says he does not wish to schedule that today. Continue beta blocker and ARB therapy.  3. Hypertensive heart disease: Blood pressure is stable on beta blocker and ARB therapy.   4. Hyperlipidemia: He recently had lipids with his primary care provider. Total cholesterol 116, tragus was 154, HDL 24, LDL 61. He remains on Lipitor 40. LFTs were within normal limits. I have encouraged him to increase his activity and exercise, as he is currently doing.  5. Poorly controlled type 2 diabetes mellitus: Recently seen by primary care with finding of hemoglobin A1c of 13.9. He probably was not taking insulin as he was supposed to be. He is committed to being more compliant. He is hopeful to lose weight and has been exercising.  6. Morbid obesity:  He is up nearly 40 pounds since his last visit in November 2015.  As above, patient has been exercising with a  goal of losing weight.  I have encouraged him to continue along this path.  7. CKD III:  Recent creat 1.8.  He is on ARB Rx and this is followed by primary care.  8.  Disposition: Follow-up in 6 months or sooner if necessary.  Murray Hodgkins, NP 12/24/2015, 2:09 PM

## 2016-01-17 DIAGNOSIS — Z89422 Acquired absence of other left toe(s): Secondary | ICD-10-CM | POA: Diagnosis not present

## 2016-01-17 DIAGNOSIS — G629 Polyneuropathy, unspecified: Secondary | ICD-10-CM | POA: Diagnosis not present

## 2016-01-17 DIAGNOSIS — E1041 Type 1 diabetes mellitus with diabetic mononeuropathy: Secondary | ICD-10-CM | POA: Diagnosis not present

## 2016-05-27 DIAGNOSIS — E11621 Type 2 diabetes mellitus with foot ulcer: Secondary | ICD-10-CM | POA: Diagnosis not present

## 2016-05-27 DIAGNOSIS — E786 Lipoprotein deficiency: Secondary | ICD-10-CM | POA: Diagnosis not present

## 2016-05-27 DIAGNOSIS — I25810 Atherosclerosis of coronary artery bypass graft(s) without angina pectoris: Secondary | ICD-10-CM | POA: Diagnosis not present

## 2016-05-27 DIAGNOSIS — Z794 Long term (current) use of insulin: Secondary | ICD-10-CM | POA: Diagnosis not present

## 2016-05-27 DIAGNOSIS — I255 Ischemic cardiomyopathy: Secondary | ICD-10-CM | POA: Diagnosis not present

## 2016-05-27 DIAGNOSIS — N183 Chronic kidney disease, stage 3 (moderate): Secondary | ICD-10-CM | POA: Diagnosis not present

## 2016-05-27 DIAGNOSIS — L97509 Non-pressure chronic ulcer of other part of unspecified foot with unspecified severity: Secondary | ICD-10-CM | POA: Diagnosis not present

## 2016-06-03 DIAGNOSIS — E1122 Type 2 diabetes mellitus with diabetic chronic kidney disease: Secondary | ICD-10-CM | POA: Diagnosis not present

## 2016-06-03 DIAGNOSIS — E875 Hyperkalemia: Secondary | ICD-10-CM | POA: Diagnosis not present

## 2016-06-03 DIAGNOSIS — N183 Chronic kidney disease, stage 3 (moderate): Secondary | ICD-10-CM | POA: Diagnosis not present

## 2016-06-03 DIAGNOSIS — I10 Essential (primary) hypertension: Secondary | ICD-10-CM | POA: Diagnosis not present

## 2016-06-03 DIAGNOSIS — I255 Ischemic cardiomyopathy: Secondary | ICD-10-CM | POA: Diagnosis not present

## 2016-06-03 DIAGNOSIS — E11621 Type 2 diabetes mellitus with foot ulcer: Secondary | ICD-10-CM | POA: Diagnosis not present

## 2016-06-03 DIAGNOSIS — E1140 Type 2 diabetes mellitus with diabetic neuropathy, unspecified: Secondary | ICD-10-CM | POA: Diagnosis not present

## 2016-06-03 DIAGNOSIS — L97412 Non-pressure chronic ulcer of right heel and midfoot with fat layer exposed: Secondary | ICD-10-CM | POA: Diagnosis not present

## 2016-06-03 DIAGNOSIS — I25810 Atherosclerosis of coronary artery bypass graft(s) without angina pectoris: Secondary | ICD-10-CM | POA: Diagnosis not present

## 2016-08-20 DIAGNOSIS — E11621 Type 2 diabetes mellitus with foot ulcer: Secondary | ICD-10-CM | POA: Diagnosis not present

## 2016-08-20 DIAGNOSIS — E1142 Type 2 diabetes mellitus with diabetic polyneuropathy: Secondary | ICD-10-CM | POA: Diagnosis not present

## 2016-08-20 DIAGNOSIS — Z794 Long term (current) use of insulin: Secondary | ICD-10-CM | POA: Diagnosis not present

## 2016-08-20 DIAGNOSIS — L97511 Non-pressure chronic ulcer of other part of right foot limited to breakdown of skin: Secondary | ICD-10-CM | POA: Diagnosis not present

## 2016-08-20 DIAGNOSIS — L97509 Non-pressure chronic ulcer of other part of unspecified foot with unspecified severity: Secondary | ICD-10-CM | POA: Diagnosis not present

## 2016-09-10 DIAGNOSIS — E1142 Type 2 diabetes mellitus with diabetic polyneuropathy: Secondary | ICD-10-CM | POA: Diagnosis not present

## 2016-09-10 DIAGNOSIS — L97509 Non-pressure chronic ulcer of other part of unspecified foot with unspecified severity: Secondary | ICD-10-CM | POA: Diagnosis not present

## 2016-09-10 DIAGNOSIS — Z794 Long term (current) use of insulin: Secondary | ICD-10-CM | POA: Diagnosis not present

## 2016-09-10 DIAGNOSIS — L97511 Non-pressure chronic ulcer of other part of right foot limited to breakdown of skin: Secondary | ICD-10-CM | POA: Diagnosis not present

## 2016-09-10 DIAGNOSIS — E11621 Type 2 diabetes mellitus with foot ulcer: Secondary | ICD-10-CM | POA: Diagnosis not present

## 2016-09-11 DIAGNOSIS — L97509 Non-pressure chronic ulcer of other part of unspecified foot with unspecified severity: Secondary | ICD-10-CM | POA: Diagnosis not present

## 2016-09-29 ENCOUNTER — Telehealth: Payer: Self-pay | Admitting: Cardiovascular Disease

## 2016-09-29 NOTE — Telephone Encounter (Signed)
3 attempts to schedule fu from recall.  Deleting recall.

## 2016-10-13 DIAGNOSIS — Z794 Long term (current) use of insulin: Secondary | ICD-10-CM | POA: Diagnosis not present

## 2016-10-13 DIAGNOSIS — E1142 Type 2 diabetes mellitus with diabetic polyneuropathy: Secondary | ICD-10-CM | POA: Diagnosis not present

## 2016-10-13 DIAGNOSIS — L97509 Non-pressure chronic ulcer of other part of unspecified foot with unspecified severity: Secondary | ICD-10-CM | POA: Diagnosis not present

## 2016-10-13 DIAGNOSIS — L97511 Non-pressure chronic ulcer of other part of right foot limited to breakdown of skin: Secondary | ICD-10-CM | POA: Diagnosis not present

## 2016-11-20 DIAGNOSIS — N183 Chronic kidney disease, stage 3 (moderate): Secondary | ICD-10-CM | POA: Diagnosis not present

## 2016-11-20 DIAGNOSIS — E1165 Type 2 diabetes mellitus with hyperglycemia: Secondary | ICD-10-CM | POA: Diagnosis not present

## 2016-11-20 DIAGNOSIS — I255 Ischemic cardiomyopathy: Secondary | ICD-10-CM | POA: Diagnosis not present

## 2016-11-20 DIAGNOSIS — I25810 Atherosclerosis of coronary artery bypass graft(s) without angina pectoris: Secondary | ICD-10-CM | POA: Diagnosis not present

## 2016-11-20 DIAGNOSIS — E11621 Type 2 diabetes mellitus with foot ulcer: Secondary | ICD-10-CM | POA: Diagnosis not present

## 2016-11-20 DIAGNOSIS — L97412 Non-pressure chronic ulcer of right heel and midfoot with fat layer exposed: Secondary | ICD-10-CM | POA: Diagnosis not present

## 2016-11-20 DIAGNOSIS — E1140 Type 2 diabetes mellitus with diabetic neuropathy, unspecified: Secondary | ICD-10-CM | POA: Diagnosis not present

## 2016-11-20 DIAGNOSIS — E1122 Type 2 diabetes mellitus with diabetic chronic kidney disease: Secondary | ICD-10-CM | POA: Diagnosis not present

## 2016-11-20 DIAGNOSIS — Z125 Encounter for screening for malignant neoplasm of prostate: Secondary | ICD-10-CM | POA: Diagnosis not present

## 2016-11-24 DIAGNOSIS — E1142 Type 2 diabetes mellitus with diabetic polyneuropathy: Secondary | ICD-10-CM | POA: Diagnosis not present

## 2016-11-24 DIAGNOSIS — L97509 Non-pressure chronic ulcer of other part of unspecified foot with unspecified severity: Secondary | ICD-10-CM | POA: Diagnosis not present

## 2016-11-24 DIAGNOSIS — M216X1 Other acquired deformities of right foot: Secondary | ICD-10-CM | POA: Diagnosis not present

## 2016-11-24 DIAGNOSIS — E11621 Type 2 diabetes mellitus with foot ulcer: Secondary | ICD-10-CM | POA: Diagnosis not present

## 2016-11-24 DIAGNOSIS — Z794 Long term (current) use of insulin: Secondary | ICD-10-CM | POA: Diagnosis not present

## 2016-11-24 DIAGNOSIS — L97512 Non-pressure chronic ulcer of other part of right foot with fat layer exposed: Secondary | ICD-10-CM | POA: Diagnosis not present

## 2016-11-27 DIAGNOSIS — E11621 Type 2 diabetes mellitus with foot ulcer: Secondary | ICD-10-CM | POA: Diagnosis not present

## 2016-11-27 DIAGNOSIS — N183 Chronic kidney disease, stage 3 (moderate): Secondary | ICD-10-CM | POA: Diagnosis not present

## 2016-11-27 DIAGNOSIS — Z794 Long term (current) use of insulin: Secondary | ICD-10-CM | POA: Diagnosis not present

## 2016-11-27 DIAGNOSIS — E875 Hyperkalemia: Secondary | ICD-10-CM | POA: Diagnosis not present

## 2016-11-27 DIAGNOSIS — Z0000 Encounter for general adult medical examination without abnormal findings: Secondary | ICD-10-CM | POA: Diagnosis not present

## 2016-11-27 DIAGNOSIS — Z23 Encounter for immunization: Secondary | ICD-10-CM | POA: Diagnosis not present

## 2016-11-27 DIAGNOSIS — Z6837 Body mass index (BMI) 37.0-37.9, adult: Secondary | ICD-10-CM | POA: Diagnosis not present

## 2016-11-27 DIAGNOSIS — I255 Ischemic cardiomyopathy: Secondary | ICD-10-CM | POA: Diagnosis not present

## 2016-11-27 DIAGNOSIS — E118 Type 2 diabetes mellitus with unspecified complications: Secondary | ICD-10-CM | POA: Diagnosis not present

## 2016-11-30 ENCOUNTER — Other Ambulatory Visit: Payer: Self-pay | Admitting: Nurse Practitioner

## 2016-11-30 DIAGNOSIS — L97509 Non-pressure chronic ulcer of other part of unspecified foot with unspecified severity: Secondary | ICD-10-CM | POA: Diagnosis not present

## 2016-12-03 ENCOUNTER — Ambulatory Visit: Payer: Commercial Managed Care - HMO | Admitting: Cardiology

## 2016-12-10 ENCOUNTER — Ambulatory Visit: Payer: Commercial Managed Care - HMO | Admitting: Cardiology

## 2016-12-10 ENCOUNTER — Ambulatory Visit (INDEPENDENT_AMBULATORY_CARE_PROVIDER_SITE_OTHER): Payer: Medicare HMO | Admitting: Cardiovascular Disease

## 2016-12-10 ENCOUNTER — Encounter: Payer: Self-pay | Admitting: Cardiovascular Disease

## 2016-12-10 VITALS — BP 152/88 | HR 70 | Ht 73.0 in | Wt 278.5 lb

## 2016-12-10 DIAGNOSIS — I951 Orthostatic hypotension: Secondary | ICD-10-CM

## 2016-12-10 DIAGNOSIS — E118 Type 2 diabetes mellitus with unspecified complications: Secondary | ICD-10-CM

## 2016-12-10 DIAGNOSIS — E7800 Pure hypercholesterolemia, unspecified: Secondary | ICD-10-CM

## 2016-12-10 DIAGNOSIS — I255 Ischemic cardiomyopathy: Secondary | ICD-10-CM

## 2016-12-10 DIAGNOSIS — N183 Chronic kidney disease, stage 3 unspecified: Secondary | ICD-10-CM

## 2016-12-10 DIAGNOSIS — I110 Hypertensive heart disease with heart failure: Secondary | ICD-10-CM

## 2016-12-10 DIAGNOSIS — I2510 Atherosclerotic heart disease of native coronary artery without angina pectoris: Secondary | ICD-10-CM | POA: Diagnosis not present

## 2016-12-10 DIAGNOSIS — I5022 Chronic systolic (congestive) heart failure: Secondary | ICD-10-CM

## 2016-12-10 DIAGNOSIS — Z951 Presence of aortocoronary bypass graft: Secondary | ICD-10-CM

## 2016-12-10 DIAGNOSIS — M86672 Other chronic osteomyelitis, left ankle and foot: Secondary | ICD-10-CM

## 2016-12-10 DIAGNOSIS — Z01810 Encounter for preprocedural cardiovascular examination: Secondary | ICD-10-CM

## 2016-12-10 DIAGNOSIS — Z794 Long term (current) use of insulin: Secondary | ICD-10-CM

## 2016-12-10 MED ORDER — ISOSORBIDE MONONITRATE ER 30 MG PO TB24: 30.0000 mg | ORAL_TABLET | Freq: Every day | ORAL | 3 refills | Status: DC

## 2016-12-10 MED ORDER — CARVEDILOL 12.5 MG PO TABS: 12.5000 mg | ORAL_TABLET | Freq: Two times a day (BID) | ORAL | 3 refills | Status: DC

## 2016-12-10 NOTE — Patient Instructions (Addendum)
Medication Instructions:   Please increase the coreg up to 12.5 mg twice a day  Start isosorbide one a day  Stay on losartan  monitor pressure at home Call the office if they run high, >140   Labwork:  No new labs needed  Testing/Procedures:  Echo in 6 months before the next visit   I recommend watching educational videos on topics of interest to you at:       www.goemmi.com  Enter code: [email or web address]    Follow-Up: It was a pleasure seeing you in the office today. Please call us if you have new issues that need to be addressed before your next appt.  [phone]  Your physician wants you to follow-up in: 6 months.  You will receive a reminder letter in the mail two months in advance. If you don't receive a letter, please call our office to schedule the follow-up appointment.  If you need a refill on your cardiac medications before your next appointment, please call your pharmacy.

## 2016-12-10 NOTE — Progress Notes (Signed)
Cardiology Office Note  Date:  12/10/2016   ID:  PAETON [name] [name], DOB August 14, 1961, MRN [id number]  PCP:  Tracie Harrier, MD   Chief Complaint  Patient presents with  . other    [name], NP 12/2015 for cardiomyopathy. Meds reviewed by the pt. verbally. Needs a cardiac clearance for right foot surgery with Dr. Vickki Muff at [facility] clinic. Pt. c/o feeling dizzy and off balance when he first gets up.     HPI:  [name] [name] is a 56 year old gentleman with history of diabetes, hyperlipidemia, coronary artery disease with bypass surgery x4 at the end of June 2015 with Dr. Cyndia Bent, with prior history of orthostatic hypotension who presents for routine follow-up Of his coronary artery disease, history of bypass   history in October 2014 of MRSA, requiring amputation of several of his toes, severe renal dysfunction at that time felt secondary to antibiotics with subsequent improvement of his renal function. Prior echocardiogram September 2014 showing ejection fraction less than 20% Most recent ejection fraction was 30-35% by echo 08/02/2014  He has not been seen in several years, presents today for follow-up, preoperative evaluation before possible surgery on his right foot by Dr. Vickki Muff  Reports his diabetes is typically poorly controlled, most recent hemoglobin A1c 10.8, was previously 13. Reports his diet is better in the past 2 weeks, sugars running better  He has a nonhealing ulcer on his right foot. He is hoping not to have surgery. He admits to significant dietary indiscretion, has been behaving better recently  Denies any significant chest pain on exertion, no shortness of breath, no symptoms concerning for angina. Has been told to get off his feet, does admit to playing golf yesterday, walked around with no symptoms  Lab work reviewed with him CR 2.1, potassium 5.4 Sees Dr. Candiss Norse nephrology next month  Takes iron pill, he is not anemic Total chol 114, LDL 69, tolerating  cholesterol medication  EKG on today's visit shows normal sinus rhythm with rate 70 bpm, old anterior MI, old inferior MI, ST and T wave abnormality 1 and aVL  Blood pressure on today's visit markedly elevated when supine, 180/98 when supine, down to 144/88 when standing He does report prior history orthostasis, 50 point drops  Other past medical history reviewed cardiac catheterization may 2015 showing 70% left main disease, 70% diagonal #2 disease, 80% proximal circumflex disease, 80% mid circumflex disease, 99% proximal RCA disease, 99% PL branch disease, ejection fraction 40%    PMH:   has a past medical history of CAD (coronary artery disease); Chronic systolic CHF (congestive heart failure) ([place]); CKD (chronic kidney disease), stage III; Diabetic foot ulcers ([place]); Hypercholesterolemia; Hypertensive heart disease; Ischemic cardiomyopathy; Neuropathy in diabetes ([place]); Peripheral vascular disease ([place]); and Type II diabetes mellitus ([place]).  PSH:    Past Surgical History:  Procedure Laterality Date  . CARDIAC CATHETERIZATION     03/2014  . CATARACT EXTRACTION    . CORONARY ARTERY BYPASS GRAFT N/A 05/07/2014   Procedure: CORONARY ARTERY BYPASS GRAFTING (CABG);  Surgeon: Gaye Pollack, MD;  Location: [place];  Service: Open Heart Surgery;  Laterality: N/A;  Times 4 using left internal mammary artery and endoscopically harvested right saphenous vein  . INTRAOPERATIVE TRANSESOPHAGEAL ECHOCARDIOGRAM N/A 05/07/2014   Procedure: INTRAOPERATIVE TRANSESOPHAGEAL ECHOCARDIOGRAM;  Surgeon: Gaye Pollack, MD;  Location: [facility] OR;  Service: Open Heart Surgery;  Laterality: N/A;  . left foot osteomyelitis and wound after surgery    . TOE AMPUTATION  Left 3 and 4 toes  . TONSILECTOMY/ADENOIDECTOMY WITH MYRINGOTOMY      Current Outpatient Prescriptions  Medication Sig Dispense Refill  . aspirin 81 MG tablet Take 81 mg by mouth daily.    Marland Kitchen atorvastatin (LIPITOR) 40 MG tablet TAKE 1 TABLET ONCE DAILY  AT 6PM (PATIENT NEEDS TO CONTACT OFFICE TO SCHEDULE FUTURE APPOINTMENT AND REFILLS. THANK YOU) 30 tablet 0  . carvedilol (COREG) 12.5 MG tablet Take 1 tablet (12.5 mg total) by mouth 2 (two) times daily with a meal. 180 tablet 3  . ferrous sulfate 325 (65 FE) MG tablet Take 325 mg by mouth daily with breakfast.    . glimepiride (AMARYL) 2 MG tablet Take 1 tablet (2 mg total) by mouth daily with breakfast. 30 tablet 1  . insulin aspart (NOVOLOG) 100 UNIT/ML FlexPen As per Sliding scale    . losartan (COZAAR) 50 MG tablet Take 50 mg by mouth daily.     . nitroGLYCERIN (NITROSTAT) 0.4 MG SL tablet Place 1 tablet (0.4 mg total) under the tongue every 5 (five) minutes as needed for chest pain. 30 tablet 1  . zolpidem (AMBIEN) 5 MG tablet Take 5 mg by mouth at bedtime as needed for sleep.    . isosorbide mononitrate (IMDUR) 30 MG 24 hr tablet Take 1 tablet (30 mg total) by mouth daily. 90 tablet 3   No current facility-administered medications for this visit.      Allergies:   Other   Social History:  The patient  reports that he has never smoked. He has never used smokeless tobacco. He reports that he does not drink alcohol or use drugs.   Family History:   family history includes Heart disease in his father.    Review of Systems: Review of Systems  Constitutional: Negative.   Respiratory: Negative.   Cardiovascular: Negative.   Gastrointestinal: Negative.   Musculoskeletal: Negative.   Skin:       Nonhealing ulcer on his foot  Neurological: Negative.   Psychiatric/Behavioral: Negative.   All other systems reviewed and are negative.    PHYSICAL EXAM: VS:  BP (!) 152/88 (BP Location: Left Arm, Patient Position: Sitting, Cuff Size: Normal)   Pulse 70   Ht 6\' 1"  (1.854 m)   Wt 278 lb 8 oz (126.3 kg)   BMI 36.74 kg/m  , BMI Body mass index is 36.74 kg/m. GEN: Well nourished, well developed, in no acute distress  HEENT: normal  Neck: no JVD, carotid bruits, or masses Cardiac:  RRR; no murmurs, rubs, or gallops,no edema  Respiratory:  clear to auscultation bilaterally, normal work of breathing GI: soft, nontender, nondistended, + BS MS: no deformity or atrophy  Skin: warm and dry, no rash Neuro:  Strength and sensation are intact Psych: euthymic mood, full affect    Recent Labs: No results found for requested labs within last 8760 hours.    Lipid Panel No results found for: CHOL, HDL, LDLCALC, TRIG    Wt Readings from Last 3 Encounters:  12/10/16 278 lb 8 oz (126.3 kg)  12/24/15 273 lb (123.8 kg)  09/19/14 227 lb (103 kg)       ASSESSMENT AND PLAN:  Coronary artery disease involving native coronary artery of native heart without angina pectoris - Plan: EKG 12-Lead, ECHOCARDIOGRAM COMPLETE Currently with no symptoms of angina. No further workup at this time. Continue current medication regimen.  Chronic systolic CHF (congestive heart failure) ([place]) - Plan: EKG 12-Lead, ECHOCARDIOGRAM COMPLETE Currently not on diuretics, will  avoid them if possible given creatinine 2.1  Ischemic cardiomyopathy - Plan: EKG 12-Lead, ECHOCARDIOGRAM COMPLETE Appears relatively euvolemic, if not prerenal  Orthostatic hypotension Discussed drop in blood pressure with him May need to increase fluid intake  Hypertensive heart disease with heart failure ([place]) Recommended he increase Coreg up to 12.5 mill grams twice a day, start isosorbide 30 mg daily  Hypercholesterolemia Cholesterol is at goal on the current lipid regimen. No changes to the medications were made.  Type 2 diabetes mellitus with complication, with long-term current use of insulin ([place]) Long discussion concerning his diabetes Symptoms stemming from dietary indiscretion Doing better in the past 2 weeks as he is trying to do watch his diet  S/P CABG x 4 Prior surgical anatomy discussed with him Stressed importance of more aggressive diabetes control  Stage 3 chronic kidney disease Recommended he  continue to work aggressively on his diabetes Avoid NSAIDs, he may need to increase his fluid intake Suggested he talk with nephrology about whether to stay on losartan given creatinine 2.1  Chronic osteomyelitis of toe of left foot ([place]) Prior history of amputation left foot, now with ulcer right foot that is nonhealing All of these issues stemming from poorly controlled diabetes  Preop cardiovascular exam He has coronary disease, history of bypass,  Currently with no symptoms of angina He would be acceptable risk for upcoming foot surgery if needed No further testing needed, as this would potentially dramatically delay timing of surgery    Total encounter time more than 45 minutes  Greater than 50% was spent in counseling and coordination of care with the patient  Disposition:   F/U  6 months   Orders Placed This Encounter  Procedures  . EKG 12-Lead  . ECHOCARDIOGRAM COMPLETE     Signed, Esmond Plants, M.D., Ph.D. 12/10/2016  [place], [place]

## 2016-12-11 ENCOUNTER — Other Ambulatory Visit: Payer: Self-pay | Admitting: *Deleted

## 2016-12-11 MED ORDER — CARVEDILOL 12.5 MG PO TABS: 12.5000 mg | ORAL_TABLET | Freq: Two times a day (BID) | ORAL | 3 refills | Status: DC

## 2016-12-15 DIAGNOSIS — E1142 Type 2 diabetes mellitus with diabetic polyneuropathy: Secondary | ICD-10-CM | POA: Diagnosis not present

## 2016-12-15 DIAGNOSIS — Z794 Long term (current) use of insulin: Secondary | ICD-10-CM | POA: Diagnosis not present

## 2016-12-15 DIAGNOSIS — L97512 Non-pressure chronic ulcer of other part of right foot with fat layer exposed: Secondary | ICD-10-CM | POA: Diagnosis not present

## 2016-12-15 DIAGNOSIS — M216X1 Other acquired deformities of right foot: Secondary | ICD-10-CM | POA: Diagnosis not present

## 2017-01-13 DIAGNOSIS — E1122 Type 2 diabetes mellitus with diabetic chronic kidney disease: Secondary | ICD-10-CM | POA: Diagnosis not present

## 2017-01-13 DIAGNOSIS — N183 Chronic kidney disease, stage 3 (moderate): Secondary | ICD-10-CM | POA: Diagnosis not present

## 2017-01-13 DIAGNOSIS — I10 Essential (primary) hypertension: Secondary | ICD-10-CM | POA: Diagnosis not present

## 2017-01-13 DIAGNOSIS — E875 Hyperkalemia: Secondary | ICD-10-CM | POA: Diagnosis not present

## 2017-01-14 DIAGNOSIS — Z794 Long term (current) use of insulin: Secondary | ICD-10-CM | POA: Diagnosis not present

## 2017-01-14 DIAGNOSIS — E875 Hyperkalemia: Secondary | ICD-10-CM | POA: Diagnosis not present

## 2017-01-14 DIAGNOSIS — L97512 Non-pressure chronic ulcer of other part of right foot with fat layer exposed: Secondary | ICD-10-CM | POA: Diagnosis not present

## 2017-01-14 DIAGNOSIS — N10 Acute tubulo-interstitial nephritis: Secondary | ICD-10-CM | POA: Diagnosis not present

## 2017-01-14 DIAGNOSIS — E1142 Type 2 diabetes mellitus with diabetic polyneuropathy: Secondary | ICD-10-CM | POA: Diagnosis not present

## 2017-01-14 DIAGNOSIS — M216X1 Other acquired deformities of right foot: Secondary | ICD-10-CM | POA: Diagnosis not present

## 2017-01-14 DIAGNOSIS — I10 Essential (primary) hypertension: Secondary | ICD-10-CM | POA: Diagnosis not present

## 2017-01-14 DIAGNOSIS — N183 Chronic kidney disease, stage 3 (moderate): Secondary | ICD-10-CM | POA: Diagnosis not present

## 2017-01-19 DIAGNOSIS — L97511 Non-pressure chronic ulcer of other part of right foot limited to breakdown of skin: Secondary | ICD-10-CM | POA: Diagnosis not present

## 2017-01-19 DIAGNOSIS — M216X1 Other acquired deformities of right foot: Secondary | ICD-10-CM | POA: Diagnosis not present

## 2017-01-20 ENCOUNTER — Encounter
Admission: RE | Admit: 2017-01-20 | Discharge: 2017-01-20 | Disposition: A | Discharge status: Home / Self Care | Payer: Medicare HMO | Source: Ambulatory Visit | Attending: Podiatry | Admitting: Podiatry

## 2017-01-20 DIAGNOSIS — I2510 Atherosclerotic heart disease of native coronary artery without angina pectoris: Secondary | ICD-10-CM | POA: Diagnosis not present

## 2017-01-20 DIAGNOSIS — E1140 Type 2 diabetes mellitus with diabetic neuropathy, unspecified: Secondary | ICD-10-CM | POA: Diagnosis not present

## 2017-01-20 DIAGNOSIS — E1151 Type 2 diabetes mellitus with diabetic peripheral angiopathy without gangrene: Secondary | ICD-10-CM | POA: Diagnosis not present

## 2017-01-20 DIAGNOSIS — Z833 Family history of diabetes mellitus: Secondary | ICD-10-CM | POA: Diagnosis not present

## 2017-01-20 DIAGNOSIS — Z794 Long term (current) use of insulin: Secondary | ICD-10-CM | POA: Diagnosis not present

## 2017-01-20 DIAGNOSIS — L97519 Non-pressure chronic ulcer of other part of right foot with unspecified severity: Secondary | ICD-10-CM | POA: Diagnosis not present

## 2017-01-20 DIAGNOSIS — Z7982 Long term (current) use of aspirin: Secondary | ICD-10-CM | POA: Diagnosis not present

## 2017-01-20 DIAGNOSIS — I255 Ischemic cardiomyopathy: Secondary | ICD-10-CM | POA: Diagnosis not present

## 2017-01-20 DIAGNOSIS — Z841 Family history of disorders of kidney and ureter: Secondary | ICD-10-CM | POA: Diagnosis not present

## 2017-01-20 DIAGNOSIS — M216X1 Other acquired deformities of right foot: Secondary | ICD-10-CM | POA: Diagnosis not present

## 2017-01-20 DIAGNOSIS — E785 Hyperlipidemia, unspecified: Secondary | ICD-10-CM | POA: Diagnosis not present

## 2017-01-20 DIAGNOSIS — N183 Chronic kidney disease, stage 3 (moderate): Secondary | ICD-10-CM | POA: Diagnosis not present

## 2017-01-20 DIAGNOSIS — Z01818 Encounter for other preprocedural examination: Secondary | ICD-10-CM | POA: Diagnosis not present

## 2017-01-20 DIAGNOSIS — E7800 Pure hypercholesterolemia, unspecified: Secondary | ICD-10-CM | POA: Diagnosis not present

## 2017-01-20 DIAGNOSIS — Z951 Presence of aortocoronary bypass graft: Secondary | ICD-10-CM | POA: Diagnosis not present

## 2017-01-20 DIAGNOSIS — E11621 Type 2 diabetes mellitus with foot ulcer: Secondary | ICD-10-CM | POA: Diagnosis not present

## 2017-01-20 DIAGNOSIS — Z79899 Other long term (current) drug therapy: Secondary | ICD-10-CM | POA: Diagnosis not present

## 2017-01-20 DIAGNOSIS — Z8249 Family history of ischemic heart disease and other diseases of the circulatory system: Secondary | ICD-10-CM | POA: Diagnosis not present

## 2017-01-20 DIAGNOSIS — E1122 Type 2 diabetes mellitus with diabetic chronic kidney disease: Secondary | ICD-10-CM | POA: Diagnosis not present

## 2017-01-20 DIAGNOSIS — I5022 Chronic systolic (congestive) heart failure: Secondary | ICD-10-CM | POA: Diagnosis not present

## 2017-01-20 DIAGNOSIS — Z89422 Acquired absence of other left toe(s): Secondary | ICD-10-CM | POA: Diagnosis not present

## 2017-01-20 DIAGNOSIS — I130 Hypertensive heart and chronic kidney disease with heart failure and stage 1 through stage 4 chronic kidney disease, or unspecified chronic kidney disease: Secondary | ICD-10-CM | POA: Diagnosis not present

## 2017-01-20 HISTORY — DX: Orthostatic hypotension: I95.1

## 2017-01-20 HISTORY — DX: Other injury of unspecified body region, initial encounter: T14.8XXA

## 2017-01-20 LAB — BASIC METABOLIC PANEL
ANION GAP: 4 — AB (ref 5–15)
BUN: 46 mg/dL — ABNORMAL HIGH (ref 6–20)
CHLORIDE: 113 mmol/L — AB (ref 101–111)
CO2: 23 mmol/L (ref 22–32)
Calcium: 8.8 mg/dL — ABNORMAL LOW (ref 8.9–10.3)
Creatinine, Ser: 1.85 mg/dL — ABNORMAL HIGH (ref 0.61–1.24)
GFR calc non Af Amer: 40 mL/min — ABNORMAL LOW (ref >60)
GFR, EST AFRICAN AMERICAN: 46 mL/min — AB (ref >60)
Glucose, Bld: 202 mg/dL — ABNORMAL HIGH (ref 65–99)
POTASSIUM: 5.1 mmol/L (ref 3.5–5.1)
Sodium: 140 mmol/L (ref 135–145)

## 2017-01-20 LAB — SURGICAL PCR SCREEN
MRSA, PCR: NEGATIVE
Staphylococcus aureus: NEGATIVE

## 2017-01-20 NOTE — Patient Instructions (Signed)
Your procedure is scheduled on: 01/22/17 Fri Report to Same Day Surgery 2nd floor medical mall [facility] Entrance-take elevator on left to 2nd floor.  Check in with surgery information desk.) To find out your arrival time please call [phone] between 1PM - 3PM on 01/21/17  Remember: Instructions that are not followed completely may result in serious medical risk, up to and including death, or upon the discretion of your surgeon and anesthesiologist your surgery may need to be rescheduled.    _x___ 1. Do not eat food or drink liquids after midnight. No gum chewing or                              hard candies.     __x__ 2. No Alcohol for 24 hours before or after surgery.   __x__3. No Smoking for 24 prior to surgery.   ____  4. Bring all medications with you on the day of surgery if instructed.    __x__ 5. Notify your doctor if there is any change in your medical condition     (cold, fever, infections).     Do not wear jewelry, make-up, hairpins, clips or nail polish.  Do not wear lotions, powders, or perfumes. You may wear deodorant.  Do not shave 48 hours prior to surgery. Men may shave face and neck.  Do not bring valuables to the hospital.    [facility] is not responsible for any belongings or valuables.               Contacts, dentures or bridgework may not be worn into surgery.  Leave your suitcase in the car. After surgery it may be brought to your room.  For patients admitted to the hospital, discharge time is determined by your                       treatment team.   Patients discharged the day of surgery will not be allowed to drive home.  You will need someone to drive you home and stay with you the night of your procedure.    Please read over the following fact sheets that you were given:   [facility] Preparing for Surgery and or MRSA Information   _x___ Take anti-hypertensive (unless it includes a diuretic), cardiac, seizure, asthma,     anti-reflux and psychiatric  medicines. These include:  1. carvedilol (COREG  2.isosorbide mononitrate (IMDUR  3.  4.  5.  6.  ____Fleets enema or Magnesium Citrate as directed.   _x___ Use CHG Soap or sage wipes as directed on instruction sheet   ____ Use inhalers on the day of surgery and bring to hospital day of surgery  ____ Stop Metformin and Janumet 2 days prior to surgery.    ____ Take 1/2 of usual insulin dose the night before surgery and none on the morning     surgery.   _x___ Follow recommendations from Cardiologist, Pulmonologist or PCP regarding          stopping Aspirin, Coumadin, Pllavix ,Eliquis, Effient, or Pradaxa, and Pletal.  X____Stop Anti-inflammatories such as Advil, Aleve, Ibuprofen, Motrin, Naproxen, Naprosyn, Goodies powders or aspirin products. OK to take Tylenol and                          Celebrex.   _x___ Stop supplements until after surgery.  But may continue Vitamin D,  Vitamin B,       and multivitamin.   ____ Bring C-Pap to the hospital.

## 2017-01-21 MED ORDER — CEFAZOLIN SODIUM-DEXTROSE 2-4 GM/100ML-% IV SOLN
2.0000 g | Freq: Once | INTRAVENOUS | Status: AC
  Administered 2017-01-22: 2 g via INTRAVENOUS

## 2017-01-22 ENCOUNTER — Ambulatory Visit: Payer: Medicare HMO | Admitting: Anesthesiology

## 2017-01-22 ENCOUNTER — Encounter
Admission: RE | Disposition: A | Discharge status: Home / Self Care | Payer: Self-pay | Source: Ambulatory Visit | Attending: Podiatry

## 2017-01-22 ENCOUNTER — Ambulatory Visit
Admission: RE | Admit: 2017-01-22 | Discharge: 2017-01-22 | Disposition: A | Discharge status: Home / Self Care | Payer: Medicare HMO | Source: Ambulatory Visit | Attending: Podiatry | Admitting: Podiatry

## 2017-01-22 ENCOUNTER — Encounter: Payer: Self-pay | Admitting: *Deleted

## 2017-01-22 DIAGNOSIS — Z7982 Long term (current) use of aspirin: Secondary | ICD-10-CM | POA: Insufficient documentation

## 2017-01-22 DIAGNOSIS — N183 Chronic kidney disease, stage 3 (moderate): Secondary | ICD-10-CM | POA: Insufficient documentation

## 2017-01-22 DIAGNOSIS — R6889 Other general symptoms and signs: Secondary | ICD-10-CM | POA: Diagnosis not present

## 2017-01-22 DIAGNOSIS — E11621 Type 2 diabetes mellitus with foot ulcer: Secondary | ICD-10-CM | POA: Diagnosis not present

## 2017-01-22 DIAGNOSIS — E1122 Type 2 diabetes mellitus with diabetic chronic kidney disease: Secondary | ICD-10-CM | POA: Insufficient documentation

## 2017-01-22 DIAGNOSIS — I2510 Atherosclerotic heart disease of native coronary artery without angina pectoris: Secondary | ICD-10-CM | POA: Insufficient documentation

## 2017-01-22 DIAGNOSIS — M216X1 Other acquired deformities of right foot: Secondary | ICD-10-CM | POA: Insufficient documentation

## 2017-01-22 DIAGNOSIS — Z8249 Family history of ischemic heart disease and other diseases of the circulatory system: Secondary | ICD-10-CM | POA: Insufficient documentation

## 2017-01-22 DIAGNOSIS — I255 Ischemic cardiomyopathy: Secondary | ICD-10-CM | POA: Insufficient documentation

## 2017-01-22 DIAGNOSIS — Z841 Family history of disorders of kidney and ureter: Secondary | ICD-10-CM | POA: Insufficient documentation

## 2017-01-22 DIAGNOSIS — Z951 Presence of aortocoronary bypass graft: Secondary | ICD-10-CM | POA: Insufficient documentation

## 2017-01-22 DIAGNOSIS — Z833 Family history of diabetes mellitus: Secondary | ICD-10-CM | POA: Insufficient documentation

## 2017-01-22 DIAGNOSIS — I5022 Chronic systolic (congestive) heart failure: Secondary | ICD-10-CM | POA: Insufficient documentation

## 2017-01-22 DIAGNOSIS — Z79899 Other long term (current) drug therapy: Secondary | ICD-10-CM | POA: Insufficient documentation

## 2017-01-22 DIAGNOSIS — L97519 Non-pressure chronic ulcer of other part of right foot with unspecified severity: Secondary | ICD-10-CM | POA: Insufficient documentation

## 2017-01-22 DIAGNOSIS — Z01818 Encounter for other preprocedural examination: Secondary | ICD-10-CM | POA: Diagnosis not present

## 2017-01-22 DIAGNOSIS — E785 Hyperlipidemia, unspecified: Secondary | ICD-10-CM | POA: Insufficient documentation

## 2017-01-22 DIAGNOSIS — E7800 Pure hypercholesterolemia, unspecified: Secondary | ICD-10-CM | POA: Insufficient documentation

## 2017-01-22 DIAGNOSIS — I130 Hypertensive heart and chronic kidney disease with heart failure and stage 1 through stage 4 chronic kidney disease, or unspecified chronic kidney disease: Secondary | ICD-10-CM | POA: Diagnosis not present

## 2017-01-22 DIAGNOSIS — Z89422 Acquired absence of other left toe(s): Secondary | ICD-10-CM | POA: Insufficient documentation

## 2017-01-22 DIAGNOSIS — L97511 Non-pressure chronic ulcer of other part of right foot limited to breakdown of skin: Secondary | ICD-10-CM | POA: Diagnosis not present

## 2017-01-22 DIAGNOSIS — M24571 Contracture, right ankle: Secondary | ICD-10-CM | POA: Diagnosis not present

## 2017-01-22 DIAGNOSIS — E1140 Type 2 diabetes mellitus with diabetic neuropathy, unspecified: Secondary | ICD-10-CM | POA: Insufficient documentation

## 2017-01-22 DIAGNOSIS — Z794 Long term (current) use of insulin: Secondary | ICD-10-CM | POA: Insufficient documentation

## 2017-01-22 DIAGNOSIS — E1151 Type 2 diabetes mellitus with diabetic peripheral angiopathy without gangrene: Secondary | ICD-10-CM | POA: Insufficient documentation

## 2017-01-22 HISTORY — PX: ACHILLES TENDON SURGERY: SHX542

## 2017-01-22 LAB — GLUCOSE, CAPILLARY
Glucose-Capillary: 139 mg/dL — ABNORMAL HIGH (ref 65–99)
Glucose-Capillary: 170 mg/dL — ABNORMAL HIGH (ref 65–99)

## 2017-01-22 LAB — POCT I-STAT 4, (NA,K, GLUC, HGB,HCT)
Glucose, Bld: 153 mg/dL — ABNORMAL HIGH (ref 65–99)
HCT: 35 % — ABNORMAL LOW (ref 39.0–52.0)
Hemoglobin: 11.9 g/dL — ABNORMAL LOW (ref 13.0–17.0)
Potassium: 4.4 mmol/L (ref 3.5–5.1)
Sodium: 143 mmol/L (ref 135–145)

## 2017-01-22 SURGERY — KIDNER PROCEDURE, MODIFIED
Anesthesia: Monitor Anesthesia Care | Site: Foot | Laterality: Right | Wound class: Clean

## 2017-01-22 MED ORDER — FENTANYL CITRATE (PF) 100 MCG/2ML IJ SOLN
INTRAMUSCULAR | Status: DC | PRN
  Administered 2017-01-22 (×2): 25 ug via INTRAVENOUS
  Administered 2017-01-22: 50 ug via INTRAVENOUS

## 2017-01-22 MED ORDER — PROPOFOL 10 MG/ML IV BOLUS
INTRAVENOUS | Status: DC | PRN
  Administered 2017-01-22: 30 mg via INTRAVENOUS

## 2017-01-22 MED ORDER — PROPOFOL 500 MG/50ML IV EMUL
INTRAVENOUS | Status: DC | PRN
  Administered 2017-01-22: 50 ug/kg/min via INTRAVENOUS

## 2017-01-22 MED ORDER — PROPOFOL 10 MG/ML IV BOLUS
INTRAVENOUS | Status: AC
  Filled 2017-01-22: qty 40

## 2017-01-22 MED ORDER — MIDAZOLAM HCL 2 MG/2ML IJ SOLN
INTRAMUSCULAR | Status: AC
  Filled 2017-01-22: qty 2

## 2017-01-22 MED ORDER — FENTANYL CITRATE (PF) 100 MCG/2ML IJ SOLN
INTRAMUSCULAR | Status: AC
  Filled 2017-01-22: qty 2

## 2017-01-22 MED ORDER — ONDANSETRON HCL 4 MG/2ML IJ SOLN: 4.0000 mg | Freq: Once | INTRAMUSCULAR | Status: DC | PRN

## 2017-01-22 MED ORDER — BUPIVACAINE HCL (PF) 0.25 % IJ SOLN
INTRAMUSCULAR | Status: AC
  Filled 2017-01-22: qty 30

## 2017-01-22 MED ORDER — FENTANYL CITRATE (PF) 100 MCG/2ML IJ SOLN
25.0000 ug | INTRAMUSCULAR | Status: DC | PRN
  Administered 2017-01-22 (×2): 25 ug via INTRAVENOUS

## 2017-01-22 MED ORDER — BUPIVACAINE HCL (PF) 0.5 % IJ SOLN
INTRAMUSCULAR | Status: AC
  Filled 2017-01-22: qty 30

## 2017-01-22 MED ORDER — HYDROCODONE-ACETAMINOPHEN 5-325 MG PO TABS: 1.0000 | ORAL_TABLET | Freq: Four times a day (QID) | ORAL | 0 refills | Status: DC | PRN

## 2017-01-22 MED ORDER — CEFAZOLIN SODIUM-DEXTROSE 2-4 GM/100ML-% IV SOLN
INTRAVENOUS | Status: AC
  Filled 2017-01-22: qty 100

## 2017-01-22 MED ORDER — SEVOFLURANE IN SOLN
RESPIRATORY_TRACT | Status: AC
  Filled 2017-01-22: qty 250

## 2017-01-22 MED ORDER — LIDOCAINE-EPINEPHRINE 0.5 %-1:200000 IJ SOLN
INTRAMUSCULAR | Status: DC | PRN
  Administered 2017-01-22: 4 mL

## 2017-01-22 MED ORDER — LIDOCAINE HCL (PF) 1 % IJ SOLN
INTRAMUSCULAR | Status: AC
  Filled 2017-01-22: qty 30

## 2017-01-22 MED ORDER — MIDAZOLAM HCL 2 MG/2ML IJ SOLN
INTRAMUSCULAR | Status: DC | PRN
  Administered 2017-01-22: 2 mg via INTRAVENOUS

## 2017-01-22 MED ORDER — LIDOCAINE-EPINEPHRINE 0.5 %-1:200000 IJ SOLN
INTRAMUSCULAR | Status: AC
  Filled 2017-01-22: qty 1

## 2017-01-22 MED ORDER — LIDOCAINE HCL (CARDIAC) 20 MG/ML IV SOLN
INTRAVENOUS | Status: DC | PRN
  Administered 2017-01-22: 100 mg via INTRAVENOUS

## 2017-01-22 MED ORDER — FENTANYL CITRATE (PF) 100 MCG/2ML IJ SOLN
INTRAMUSCULAR | Status: AC
  Administered 2017-01-22: 25 ug via INTRAVENOUS
  Filled 2017-01-22: qty 2

## 2017-01-22 MED ORDER — SODIUM CHLORIDE 0.9 % IV SOLN
INTRAVENOUS | Status: DC
  Administered 2017-01-22: 08:00:00 via INTRAVENOUS

## 2017-01-22 SURGICAL SUPPLY — 52 items
BANDAGE ELASTIC 4 LF NS (GAUZE/BANDAGES/DRESSINGS) ×4 IMPLANT
BANDAGE STRETCH 3X4.1 STRL (GAUZE/BANDAGES/DRESSINGS) ×2 IMPLANT
BLADE SURG 15 STRL LF DISP TIS (BLADE) ×2 IMPLANT
BLADE SURG 15 STRL SS (BLADE) ×2
BLADE SURG MINI STRL (BLADE) ×2 IMPLANT
BNDG ESMARK 4X12 TAN STRL LF (GAUZE/BANDAGES/DRESSINGS) ×2 IMPLANT
BNDG ESMARK 6X12 TAN STRL LF (GAUZE/BANDAGES/DRESSINGS) ×2 IMPLANT
CANISTER SUCT 1200ML W/VALVE (MISCELLANEOUS) ×2 IMPLANT
DRAPE FLUOR MINI C-ARM 54X84 (DRAPES) ×2 IMPLANT
DURAPREP 26ML APPLICATOR (WOUND CARE) ×2 IMPLANT
ELECT REM PT RETURN 9FT ADLT (ELECTROSURGICAL) ×2
ELECTRODE REM PT RTRN 9FT ADLT (ELECTROSURGICAL) ×1 IMPLANT
GAUZE PETRO XEROFOAM 1X8 (MISCELLANEOUS) ×2 IMPLANT
GAUZE SPONGE 4X4 12PLY STRL (GAUZE/BANDAGES/DRESSINGS) ×2 IMPLANT
GAUZE STRETCH 2X75IN STRL (MISCELLANEOUS) ×2 IMPLANT
GLOVE BIO SURGEON STRL SZ7.5 (GLOVE) ×2 IMPLANT
GLOVE INDICATOR 8.0 STRL GRN (GLOVE) ×2 IMPLANT
GOWN STRL REUS W/ TWL LRG LVL3 (GOWN DISPOSABLE) ×2 IMPLANT
GOWN STRL REUS W/TWL LRG LVL3 (GOWN DISPOSABLE) ×2
HANDLE YANKAUER SUCT BULB TIP (MISCELLANEOUS) ×2 IMPLANT
KIT RM TURNOVER STRD PROC AR (KITS) ×2 IMPLANT
LABEL OR SOLS (LABEL) ×2 IMPLANT
NDL MAYO CATGUT SZ5 (NEEDLE) ×1
NDL SUT 5 .5 CRC TPR PNT MAYO (NEEDLE) ×1 IMPLANT
NEEDLE FILTER BLUNT 18X 1/2SAF (NEEDLE) ×1
NEEDLE FILTER BLUNT 18X1 1/2 (NEEDLE) ×1 IMPLANT
NEEDLE HYPO 25X1 1.5 SAFETY (NEEDLE) ×6 IMPLANT
NS IRRIG 500ML POUR BTL (IV SOLUTION) ×2 IMPLANT
PACK EXTREMITY ARMC (MISCELLANEOUS) ×2 IMPLANT
PAD CAST CTTN 4X4 STRL (SOFTGOODS) ×1 IMPLANT
PADDING CAST COTTON 4X4 STRL (SOFTGOODS) ×1
RASP SM TEAR CROSS CUT (RASP) ×2 IMPLANT
SPLINT CAST 1 STEP 5X30 WHT (MISCELLANEOUS) ×2 IMPLANT
SPLINT FAST PLASTER 5X30 (CAST SUPPLIES) ×1
SPLINT PLASTER CAST FAST 5X30 (CAST SUPPLIES) ×1 IMPLANT
SPONGE LAP 18X18 5 PK (GAUZE/BANDAGES/DRESSINGS) ×2 IMPLANT
STOCKINETTE M/LG 89821 (MISCELLANEOUS) ×2 IMPLANT
STRIP CLOSURE SKIN 1/2X4 (GAUZE/BANDAGES/DRESSINGS) ×2 IMPLANT
SUT MNCRL+ 5-0 VIOLET P-3 (SUTURE) ×1 IMPLANT
SUT MONOCRYL 5-0 (SUTURE) ×1
SUT PDS AB 0 CT1 27 (SUTURE) IMPLANT
SUT VIC AB 0 SH 27 (SUTURE) ×2 IMPLANT
SUT VIC AB 2-0 SH 27 (SUTURE) ×2
SUT VIC AB 2-0 SH 27XBRD (SUTURE) ×2 IMPLANT
SUT VIC AB 3-0 SH 27 (SUTURE) ×1
SUT VIC AB 3-0 SH 27X BRD (SUTURE) ×1 IMPLANT
SUT VIC AB 4-0 FS2 27 (SUTURE) ×2 IMPLANT
SUT VICRYL AB 3-0 FS1 BRD 27IN (SUTURE) ×2 IMPLANT
SWABSTK COMLB BENZOIN TINCTURE (MISCELLANEOUS) ×2 IMPLANT
SYR 3ML LL SCALE MARK (SYRINGE) ×2 IMPLANT
SYRINGE 10CC LL (SYRINGE) ×4 IMPLANT
WIRE MAGNUM (SUTURE) ×2 IMPLANT

## 2017-01-22 NOTE — Anesthesia Post-op Follow-up Note (Cosign Needed)
Anesthesia QCDR form completed.        

## 2017-01-22 NOTE — Anesthesia Preprocedure Evaluation (Signed)
Anesthesia Evaluation  Patient identified by MRN, date of birth, ID band Patient awake    Reviewed: Allergy & Precautions, H&P , NPO status , Patient's Chart, lab work & pertinent test results, reviewed documented beta blocker date and time   Airway Mallampati: III   Neck ROM: full    Dental  (+) Poor Dentition, Teeth Intact   Pulmonary neg pulmonary ROS,    Pulmonary exam normal        Cardiovascular + CAD, + Past MI, + CABG, + Peripheral Vascular Disease and +CHF  negative cardio ROS Normal cardiovascular exam Rhythm:regular Rate:Normal     Neuro/Psych negative neurological ROS  negative psych ROS   GI/Hepatic negative GI ROS, Neg liver ROS,   Endo/Other  negative endocrine ROSdiabetes  Renal/GU Renal diseasenegative Renal ROS  negative genitourinary   Musculoskeletal   Abdominal   Peds  Hematology negative hematology ROS (+)   Anesthesia Other Findings Past Medical History: No date: CAD (coronary artery disease)     Comment: a. 04/2014 CABG x 4 (LIMA->LAD, VG->Diag,               VG->OM, VG->PDA). No date: Chronic systolic CHF (congestive heart failure*     Comment: a. 07/2014 Echo: EF 30-35%. No date: CKD (chronic kidney disease), stage III     Comment: a. 12/2015 Creat 1.8. No date: Diabetic foot ulcers ([place])     Comment: a. left foot 2nd digit ant 5 th digit 05/03/14;              b. 08/2014 s/p amputation of toes on L foot. No date: Hypercholesterolemia     Comment: a. 12/2015 TC 116, TG 154, HDL 24, LDL               61-->Atorvastatin 40. No date: Hypertensive heart disease No date: Ischemic cardiomyopathy     Comment: a. 07/2013 Echo: EF 20%; b. 07/2014 Echo: EF               30-35%, diff HK, Gr1 DD, mildly dil LA, nl               PASP. No date: Myocardial infarction No date: Neuropathy in diabetes ([place]) No date: Open wound     Comment: foot No date: Orthostatic hypotension No date: Peripheral  vascular disease ([place]) No date: Type II diabetes mellitus ([place])     Comment: a. 12/2015 HbA1c = 13.9. Past Surgical History: No date: CARDIAC CATHETERIZATION     Comment: 03/2014 No date: CATARACT EXTRACTION 05/07/2014: CORONARY ARTERY BYPASS GRAFT N/A     Comment: Procedure: CORONARY ARTERY BYPASS GRAFTING               (CABG);  Surgeon: Gaye Pollack, MD;                Location: [facility];  Service: Open Heart Surgery;               Laterality: N/A;  Times 4 using left internal               mammary artery and endoscopically harvested               right saphenous vein 05/07/2014: INTRAOPERATIVE TRANSESOPHAGEAL ECHOCARDIOGRAM N/A     Comment: Procedure: INTRAOPERATIVE TRANSESOPHAGEAL               ECHOCARDIOGRAM;  Surgeon: Gaye Pollack, MD;  Location: [facility];  Service: Open Heart Surgery;               Laterality: N/A; No date: left foot osteomyelitis and wound after surgery No date: TOE AMPUTATION     Comment: Left 3 and 4 toes No date: TONSILECTOMY/ADENOIDECTOMY WITH MYRINGOTOMY BMI    Body Mass Index:  36.94 kg/m     Reproductive/Obstetrics negative OB ROS                             Anesthesia Physical Anesthesia Plan  ASA: III  Anesthesia Plan: General and MAC   Post-op Pain Management:    Induction:   Airway Management Planned:   Additional Equipment:   Intra-op Plan:   Post-operative Plan:   Informed Consent: I have reviewed the patients History and Physical, chart, labs and discussed the procedure including the risks, benefits and alternatives for the proposed anesthesia with the patient or authorized representative who has indicated his/her understanding and acceptance.   Dental Advisory Given  Plan Discussed with: CRNA  Anesthesia Plan Comments:         Anesthesia Quick Evaluation

## 2017-01-22 NOTE — H&P (Signed)
HISTORY AND PHYSICAL INTERVAL NOTE:  01/22/2017  8:50 AM  [name]  has presented today for surgery, with the diagnosis of Gastrocnemius equinus/ M21.6X1.  The various methods of treatment have been discussed with the patient.  No guarantees were given.  After consideration of risks, benefits and other options for treatment, the patient has consented to surgery.  I have reviewed the patients' chart and labs.    Patient Vitals for the past 24 hrs:  BP Temp Temp src Pulse Resp SpO2 Height Weight  01/22/17 0728 (!) 153/92 (!) 96.4 F (35.8 C) Tympanic 83 18 98 % 6\' 1"  (1.854 m) 127 kg (280 lb)    A history and physical examination was performed in my office.  The patient was reexamined.  There have been no changes to this history and physical examination.  [name] [name] A

## 2017-01-22 NOTE — Discharge Instructions (Signed)
[facility] °[facility] ° °POST OPERATIVE INSTRUCTIONS FOR DR. TROXLER AND DR. FOWLER °[facility] ° ° °1. Take your medication as prescribed.  Pain medication should be taken only as needed. ° °2. Keep the dressing clean, dry and intact. ° °3. Keep your foot elevated above the heart level for the first 48 hours. ° °4. Walking to the bathroom and brief periods of walking are acceptable, unless we have instructed you to be non-weight bearing. ° °5. Always wear your post-op shoe when walking.  Always use your crutches if you are to be non-weight bearing. ° °6. Do not take a shower. Baths are permissible as long as the foot is kept out of the water.  ° °7. Every hour you are awake:  °- Bend your knee 15 times. °- Flex foot 15 times °- Massage calf 15 times ° °8. Call [facility] ([phone]) if any of the following problems occur: °- You develop a temperature or fever. °- The bandage becomes saturated with blood. °- Medication does not stop your pain. °- Injury of the foot occurs. °- Any symptoms of infection including redness, odor, or red streaks running from wound. ° ° ° ° °AMBULATORY SURGERY  °DISCHARGE INSTRUCTIONS ° ° °1) The drugs that you were given will stay in your system until tomorrow so for the next 24 hours you should not: ° °A) Drive an automobile °B) Make any legal decisions °C) Drink any alcoholic beverage ° ° °2) You may resume regular meals tomorrow.  Today it is better to start with liquids and gradually work up to solid foods. ° °You may eat anything you prefer, but it is better to start with liquids, then soup and crackers, and gradually work up to solid foods. ° ° °3) Please notify your doctor immediately if you have any unusual bleeding, trouble breathing, redness and pain at the surgery site, drainage, fever, or pain not relieved by medication. ° ° ° °4) Additional Instructions: ° ° ° ° ° ° ° °Please contact your physician with any  problems or Same Day Surgery at [phone], Monday through Friday 6 am to 4 pm, or [place] [facility] number at [phone]. ° °

## 2017-01-22 NOTE — Transfer of Care (Signed)
Immediate Anesthesia Transfer of Care Note  Patient: [name] [name]  Procedure(s) Performed: Procedure(s): ACHILLES LENGTHENING/KIDNER/TAL/Teno achilles lengthening (Right)  Patient Location: PACU  Anesthesia Type:MAC  Level of Consciousness: awake, alert , oriented and patient cooperative  Airway & Oxygen Therapy: Patient Spontanous Breathing and Patient connected to nasal cannula oxygen  Post-op Assessment: Report given to RN, Post -op Vital signs reviewed and stable and Patient moving all extremities  Post vital signs: Reviewed and stable  Last Vitals:  Vitals:   01/22/17 0728 01/22/17 0945  BP: (!) 153/92 114/61  Pulse: 83 71  Resp: 18 13  Temp: (!) 35.8 C 36.3 C    Last Pain:  Vitals:   01/22/17 0728  TempSrc: Tympanic         Complications: No apparent anesthesia complications

## 2017-01-22 NOTE — Op Note (Signed)
Operative note   Surgeon:Mykal Batiz    Assistant:none    Preop diagnosis:. 1.  Equinus deformity right lower extremity 2. Superficial ulceration plantar right second MTPJ    Postop diagnosis:same    Procedure:1. Percutaneous tendo Achilles lengthening right lower extremity 2. Limited excisional debridement plantar right second MTPJ ulceration    ZCH:[id number]    Anesthesia:local and IV sedation    Hemostasis:epinephrine infiltratethe incision site with lidocaine    Specimen:none    Complications:none    Operative indications:[name] [name] is an 56 y.o. that presents today for surgical intervention.  The risks/benefits/alternatives/complications have been discussed and consent has been given.    Procedure:  Patient was brought into the OR and placed on the operating table in thesupine position. After anesthesia was obtained theright lower extremity was prepped and draped in usual sterile fashion.  Attention was initially directed to the posterior aspect of the Achilles insertional site. At one 3 and 5 cm from its insertion 3 percutaneous hemi-resections were performed. The proximal and distalincisions were medially based in the central was laterally based. Helpful excursion was noted. Good dorsiflexion of the foot was noted. The Achilles was found to be intact. The incisions were then closed with 3-0 nylon.  Next a superficial excisional debridement with a 15 blade of a once a medial ulceration to the plantar aspect of the second metatarsal head was performed. This was limited to breakdown of the skin.    After debridement a large bulky dressing was then applied. He was then placed in his equalizer walker boot that had previously been dispensed.    Patient tolerated the procedure and anesthesia well.  Was transported from the OR to the PACU with all vital signs stable and vascular status intact. To be discharged per routine protocol.  Will follow up in approximately 1 week in the  outpatient clinic.

## 2017-01-26 DIAGNOSIS — R6889 Other general symptoms and signs: Secondary | ICD-10-CM | POA: Diagnosis not present

## 2017-02-02 DIAGNOSIS — R6889 Other general symptoms and signs: Secondary | ICD-10-CM | POA: Diagnosis not present

## 2017-02-02 NOTE — Anesthesia Postprocedure Evaluation (Signed)
Anesthesia Post Note  Patient: [name] [name]  Procedure(s) Performed: Procedure(s) (LRB): ACHILLES LENGTHENING/KIDNER/TAL/Teno achilles lengthening (Right)  Patient location during evaluation: PACU Anesthesia Type: MAC Level of consciousness: awake and alert Pain management: pain level controlled Vital Signs Assessment: post-procedure vital signs reviewed and stable Respiratory status: spontaneous breathing, nonlabored ventilation, respiratory function stable and patient connected to nasal cannula oxygen Cardiovascular status: blood pressure returned to baseline and stable Postop Assessment: no signs of nausea or vomiting Anesthetic complications: no     Last Vitals:  Vitals:   01/22/17 1039 01/22/17 1115  BP: 123/72 139/76  Pulse: 69 65  Resp:  16  Temp: 36.1 C     Last Pain:  Vitals:   01/22/17 1115  TempSrc:   PainSc: 0-No pain                 Molli Barrows

## 2017-02-04 ENCOUNTER — Other Ambulatory Visit: Payer: Self-pay | Admitting: Cardiovascular Disease

## 2017-02-23 DIAGNOSIS — R6889 Other general symptoms and signs: Secondary | ICD-10-CM | POA: Diagnosis not present

## 2017-02-23 DIAGNOSIS — L97511 Non-pressure chronic ulcer of other part of right foot limited to breakdown of skin: Secondary | ICD-10-CM | POA: Diagnosis not present

## 2017-03-22 DIAGNOSIS — Z794 Long term (current) use of insulin: Secondary | ICD-10-CM | POA: Diagnosis not present

## 2017-03-22 DIAGNOSIS — I255 Ischemic cardiomyopathy: Secondary | ICD-10-CM | POA: Diagnosis not present

## 2017-03-22 DIAGNOSIS — E118 Type 2 diabetes mellitus with unspecified complications: Secondary | ICD-10-CM | POA: Diagnosis not present

## 2017-03-22 DIAGNOSIS — N183 Chronic kidney disease, stage 3 (moderate): Secondary | ICD-10-CM | POA: Diagnosis not present

## 2017-03-22 DIAGNOSIS — Z0000 Encounter for general adult medical examination without abnormal findings: Secondary | ICD-10-CM | POA: Diagnosis not present

## 2017-03-22 DIAGNOSIS — Z6837 Body mass index (BMI) 37.0-37.9, adult: Secondary | ICD-10-CM | POA: Diagnosis not present

## 2017-03-22 DIAGNOSIS — L97411 Non-pressure chronic ulcer of right heel and midfoot limited to breakdown of skin: Secondary | ICD-10-CM | POA: Diagnosis not present

## 2017-03-22 DIAGNOSIS — E875 Hyperkalemia: Secondary | ICD-10-CM | POA: Diagnosis not present

## 2017-03-22 DIAGNOSIS — E11621 Type 2 diabetes mellitus with foot ulcer: Secondary | ICD-10-CM | POA: Diagnosis not present

## 2017-03-29 DIAGNOSIS — Z794 Long term (current) use of insulin: Secondary | ICD-10-CM | POA: Diagnosis not present

## 2017-03-29 DIAGNOSIS — D649 Anemia, unspecified: Secondary | ICD-10-CM | POA: Diagnosis not present

## 2017-03-29 DIAGNOSIS — I255 Ischemic cardiomyopathy: Secondary | ICD-10-CM | POA: Diagnosis not present

## 2017-03-29 DIAGNOSIS — Z951 Presence of aortocoronary bypass graft: Secondary | ICD-10-CM | POA: Diagnosis not present

## 2017-03-29 DIAGNOSIS — M5431 Sciatica, right side: Secondary | ICD-10-CM | POA: Diagnosis not present

## 2017-03-29 DIAGNOSIS — E118 Type 2 diabetes mellitus with unspecified complications: Secondary | ICD-10-CM | POA: Diagnosis not present

## 2017-03-29 DIAGNOSIS — I25810 Atherosclerosis of coronary artery bypass graft(s) without angina pectoris: Secondary | ICD-10-CM | POA: Diagnosis not present

## 2017-04-06 DIAGNOSIS — E1129 Type 2 diabetes mellitus with other diabetic kidney complication: Secondary | ICD-10-CM | POA: Diagnosis not present

## 2017-04-06 DIAGNOSIS — I10 Essential (primary) hypertension: Secondary | ICD-10-CM | POA: Diagnosis not present

## 2017-04-06 DIAGNOSIS — N184 Chronic kidney disease, stage 4 (severe): Secondary | ICD-10-CM | POA: Diagnosis not present

## 2017-04-06 DIAGNOSIS — E875 Hyperkalemia: Secondary | ICD-10-CM | POA: Diagnosis not present

## 2017-04-06 DIAGNOSIS — I5022 Chronic systolic (congestive) heart failure: Secondary | ICD-10-CM | POA: Diagnosis not present

## 2017-05-21 ENCOUNTER — Other Ambulatory Visit: Payer: Self-pay | Admitting: Cardiovascular Disease

## 2017-05-25 DIAGNOSIS — E1142 Type 2 diabetes mellitus with diabetic polyneuropathy: Secondary | ICD-10-CM | POA: Diagnosis not present

## 2017-05-25 DIAGNOSIS — Z89432 Acquired absence of left foot: Secondary | ICD-10-CM | POA: Diagnosis not present

## 2017-05-25 DIAGNOSIS — L97509 Non-pressure chronic ulcer of other part of unspecified foot with unspecified severity: Secondary | ICD-10-CM | POA: Diagnosis not present

## 2017-05-25 DIAGNOSIS — Z794 Long term (current) use of insulin: Secondary | ICD-10-CM | POA: Diagnosis not present

## 2017-05-25 DIAGNOSIS — E11621 Type 2 diabetes mellitus with foot ulcer: Secondary | ICD-10-CM | POA: Diagnosis not present

## 2017-06-09 ENCOUNTER — Ambulatory Visit (INDEPENDENT_AMBULATORY_CARE_PROVIDER_SITE_OTHER): Payer: Medicare HMO

## 2017-06-09 ENCOUNTER — Other Ambulatory Visit: Payer: Self-pay

## 2017-06-09 DIAGNOSIS — I255 Ischemic cardiomyopathy: Secondary | ICD-10-CM | POA: Diagnosis not present

## 2017-06-09 DIAGNOSIS — I2510 Atherosclerotic heart disease of native coronary artery without angina pectoris: Secondary | ICD-10-CM | POA: Diagnosis not present

## 2017-06-09 DIAGNOSIS — I5022 Chronic systolic (congestive) heart failure: Secondary | ICD-10-CM

## 2017-07-20 DIAGNOSIS — E875 Hyperkalemia: Secondary | ICD-10-CM | POA: Diagnosis not present

## 2017-07-20 DIAGNOSIS — I10 Essential (primary) hypertension: Secondary | ICD-10-CM | POA: Diagnosis not present

## 2017-07-20 DIAGNOSIS — I255 Ischemic cardiomyopathy: Secondary | ICD-10-CM | POA: Diagnosis not present

## 2017-07-20 DIAGNOSIS — M5431 Sciatica, right side: Secondary | ICD-10-CM | POA: Diagnosis not present

## 2017-07-20 DIAGNOSIS — Z794 Long term (current) use of insulin: Secondary | ICD-10-CM | POA: Diagnosis not present

## 2017-07-20 DIAGNOSIS — I5022 Chronic systolic (congestive) heart failure: Secondary | ICD-10-CM | POA: Diagnosis not present

## 2017-07-20 DIAGNOSIS — E118 Type 2 diabetes mellitus with unspecified complications: Secondary | ICD-10-CM | POA: Diagnosis not present

## 2017-07-20 DIAGNOSIS — Z951 Presence of aortocoronary bypass graft: Secondary | ICD-10-CM | POA: Diagnosis not present

## 2017-07-20 DIAGNOSIS — D510 Vitamin B12 deficiency anemia due to intrinsic factor deficiency: Secondary | ICD-10-CM | POA: Diagnosis not present

## 2017-07-20 DIAGNOSIS — D649 Anemia, unspecified: Secondary | ICD-10-CM | POA: Diagnosis not present

## 2017-07-20 DIAGNOSIS — N183 Chronic kidney disease, stage 3 (moderate): Secondary | ICD-10-CM | POA: Diagnosis not present

## 2017-07-20 DIAGNOSIS — I25810 Atherosclerosis of coronary artery bypass graft(s) without angina pectoris: Secondary | ICD-10-CM | POA: Diagnosis not present

## 2017-07-20 DIAGNOSIS — E1129 Type 2 diabetes mellitus with other diabetic kidney complication: Secondary | ICD-10-CM | POA: Diagnosis not present

## 2017-07-27 DIAGNOSIS — Z0000 Encounter for general adult medical examination without abnormal findings: Secondary | ICD-10-CM | POA: Diagnosis not present

## 2017-07-27 DIAGNOSIS — E1122 Type 2 diabetes mellitus with diabetic chronic kidney disease: Secondary | ICD-10-CM | POA: Diagnosis not present

## 2017-07-27 DIAGNOSIS — E7800 Pure hypercholesterolemia, unspecified: Secondary | ICD-10-CM | POA: Diagnosis not present

## 2017-07-27 DIAGNOSIS — R7989 Other specified abnormal findings of blood chemistry: Secondary | ICD-10-CM | POA: Diagnosis not present

## 2017-07-27 DIAGNOSIS — I255 Ischemic cardiomyopathy: Secondary | ICD-10-CM | POA: Diagnosis not present

## 2017-07-27 DIAGNOSIS — E1140 Type 2 diabetes mellitus with diabetic neuropathy, unspecified: Secondary | ICD-10-CM | POA: Diagnosis not present

## 2017-07-27 DIAGNOSIS — I25810 Atherosclerosis of coronary artery bypass graft(s) without angina pectoris: Secondary | ICD-10-CM | POA: Diagnosis not present

## 2017-07-27 DIAGNOSIS — Z794 Long term (current) use of insulin: Secondary | ICD-10-CM | POA: Diagnosis not present

## 2017-07-27 DIAGNOSIS — N183 Chronic kidney disease, stage 3 (moderate): Secondary | ICD-10-CM | POA: Diagnosis not present

## 2017-11-16 ENCOUNTER — Other Ambulatory Visit: Payer: Self-pay | Admitting: Cardiovascular Disease

## 2017-11-19 DIAGNOSIS — E1140 Type 2 diabetes mellitus with diabetic neuropathy, unspecified: Secondary | ICD-10-CM | POA: Diagnosis not present

## 2017-11-19 DIAGNOSIS — E7800 Pure hypercholesterolemia, unspecified: Secondary | ICD-10-CM | POA: Diagnosis not present

## 2017-11-19 DIAGNOSIS — I25810 Atherosclerosis of coronary artery bypass graft(s) without angina pectoris: Secondary | ICD-10-CM | POA: Diagnosis not present

## 2017-11-19 DIAGNOSIS — E538 Deficiency of other specified B group vitamins: Secondary | ICD-10-CM | POA: Diagnosis not present

## 2017-11-19 DIAGNOSIS — E1122 Type 2 diabetes mellitus with diabetic chronic kidney disease: Secondary | ICD-10-CM | POA: Diagnosis not present

## 2017-11-19 DIAGNOSIS — Z794 Long term (current) use of insulin: Secondary | ICD-10-CM | POA: Diagnosis not present

## 2017-11-19 DIAGNOSIS — N183 Chronic kidney disease, stage 3 (moderate): Secondary | ICD-10-CM | POA: Diagnosis not present

## 2017-11-19 DIAGNOSIS — I255 Ischemic cardiomyopathy: Secondary | ICD-10-CM | POA: Diagnosis not present

## 2017-11-26 DIAGNOSIS — N183 Chronic kidney disease, stage 3 (moderate): Secondary | ICD-10-CM | POA: Diagnosis not present

## 2017-11-26 DIAGNOSIS — Z794 Long term (current) use of insulin: Secondary | ICD-10-CM | POA: Diagnosis not present

## 2017-11-26 DIAGNOSIS — I255 Ischemic cardiomyopathy: Secondary | ICD-10-CM | POA: Diagnosis not present

## 2017-11-26 DIAGNOSIS — E118 Type 2 diabetes mellitus with unspecified complications: Secondary | ICD-10-CM | POA: Diagnosis not present

## 2017-11-26 DIAGNOSIS — I25810 Atherosclerosis of coronary artery bypass graft(s) without angina pectoris: Secondary | ICD-10-CM | POA: Diagnosis not present

## 2017-12-08 DIAGNOSIS — E875 Hyperkalemia: Secondary | ICD-10-CM | POA: Diagnosis not present

## 2017-12-08 DIAGNOSIS — N183 Chronic kidney disease, stage 3 (moderate): Secondary | ICD-10-CM | POA: Diagnosis not present

## 2017-12-08 DIAGNOSIS — E1129 Type 2 diabetes mellitus with other diabetic kidney complication: Secondary | ICD-10-CM | POA: Diagnosis not present

## 2018-02-11 ENCOUNTER — Other Ambulatory Visit: Payer: Self-pay | Admitting: Internal Medicine

## 2018-02-11 DIAGNOSIS — R202 Paresthesia of skin: Secondary | ICD-10-CM | POA: Diagnosis not present

## 2018-02-11 DIAGNOSIS — Z794 Long term (current) use of insulin: Secondary | ICD-10-CM

## 2018-02-11 DIAGNOSIS — E118 Type 2 diabetes mellitus with unspecified complications: Secondary | ICD-10-CM | POA: Diagnosis not present

## 2018-02-11 DIAGNOSIS — R200 Anesthesia of skin: Secondary | ICD-10-CM | POA: Diagnosis not present

## 2018-02-11 DIAGNOSIS — I25810 Atherosclerosis of coronary artery bypass graft(s) without angina pectoris: Secondary | ICD-10-CM

## 2018-02-11 DIAGNOSIS — R109 Unspecified abdominal pain: Secondary | ICD-10-CM

## 2018-02-11 DIAGNOSIS — R1084 Generalized abdominal pain: Secondary | ICD-10-CM | POA: Diagnosis not present

## 2018-02-11 DIAGNOSIS — I255 Ischemic cardiomyopathy: Secondary | ICD-10-CM | POA: Diagnosis not present

## 2018-02-11 DIAGNOSIS — Z951 Presence of aortocoronary bypass graft: Secondary | ICD-10-CM | POA: Diagnosis not present

## 2018-02-11 DIAGNOSIS — R10A2 Flank pain, left side: Secondary | ICD-10-CM

## 2018-02-15 ENCOUNTER — Ambulatory Visit: Payer: Medicare HMO | Admitting: Cardiovascular Disease

## 2018-02-15 ENCOUNTER — Encounter: Payer: Self-pay | Admitting: Cardiovascular Disease

## 2018-02-15 VITALS — BP 152/90 | HR 77 | Ht 73.0 in | Wt 270.8 lb

## 2018-02-15 DIAGNOSIS — E782 Mixed hyperlipidemia: Secondary | ICD-10-CM

## 2018-02-15 DIAGNOSIS — E118 Type 2 diabetes mellitus with unspecified complications: Secondary | ICD-10-CM

## 2018-02-15 DIAGNOSIS — Z794 Long term (current) use of insulin: Secondary | ICD-10-CM | POA: Diagnosis not present

## 2018-02-15 DIAGNOSIS — N183 Chronic kidney disease, stage 3 unspecified: Secondary | ICD-10-CM

## 2018-02-15 DIAGNOSIS — Z951 Presence of aortocoronary bypass graft: Secondary | ICD-10-CM | POA: Diagnosis not present

## 2018-02-15 DIAGNOSIS — I255 Ischemic cardiomyopathy: Secondary | ICD-10-CM | POA: Diagnosis not present

## 2018-02-15 DIAGNOSIS — I2510 Atherosclerotic heart disease of native coronary artery without angina pectoris: Secondary | ICD-10-CM | POA: Diagnosis not present

## 2018-02-15 DIAGNOSIS — I951 Orthostatic hypotension: Secondary | ICD-10-CM

## 2018-02-15 NOTE — Patient Instructions (Addendum)
Medication Instructions:   No medication changes made  Labwork:  No new labs needed  Testing/Procedures:  We will order a lexiscan myoview for chest pain/arm pain, known CAD, CABG [place]  Your caregiver has ordered a Stress Test with nuclear imaging. The purpose of this test is to evaluate the blood supply to your heart muscle. This procedure is referred to as a "Non-Invasive Stress Test." This is because other than having an IV started in your vein, nothing is inserted or "invades" your body. Cardiac stress tests are done to find areas of poor blood flow to the heart by determining the extent of coronary artery disease (CAD). Some patients exercise on a treadmill, which naturally increases the blood flow to your heart, while others who are  unable to walk on a treadmill due to physical limitations have a pharmacologic/chemical stress agent called Lexiscan . This medicine will mimic walking on a treadmill by temporarily increasing your coronary blood flow.   Please note: these test may take anywhere between 2-4 hours to complete  PLEASE REPORT TO [facility] AT THE FIRST DESK WILL DIRECT YOU WHERE TO GO  Date of Procedure:_____________________________________  Arrival Time for Procedure:______________________________  Instructions regarding medication:    _XX_:  Hold Carvedilol & Isosorbide Mononitrate the night before procedure and morning of procedure   PLEASE NOTIFY THE OFFICE AT LEAST 24 HOURS IN ADVANCE IF YOU ARE UNABLE TO KEEP YOUR APPOINTMENT.  [phone] AND  PLEASE NOTIFY NUCLEAR MEDICINE AT [facility] AT LEAST 24 HOURS IN ADVANCE IF YOU ARE UNABLE TO KEEP YOUR APPOINTMENT. [phone]  How to prepare for your Myoview test:  1. Do not eat or drink after midnight 2. No caffeine for 24 hours prior to test 3. No smoking 24 hours prior to test. 4. Your medication may be taken with water.  If your doctor stopped a medication because of  this test, do not take that medication. 5. Ladies, please do not wear dresses.  Skirts or pants are appropriate. Please wear a short sleeve shirt. 6. No perfume, cologne or lotion. 7. Wear comfortable walking shoes. No heels!   Follow-Up: It was a pleasure seeing you in the office today. Please call us if you have new issues that need to be addressed before your next appt.  [phone]  Your physician wants you to follow-up in: 12 months.  You will receive a reminder letter in the mail two months in advance. If you don't receive a letter, please call our office to schedule the follow-up appointment.  If you need a refill on your cardiac medications before your next appointment, please call your pharmacy.  For educational health videos Log in to : [email or web address] Or : [email or web address], password : [email or web address]

## 2018-02-15 NOTE — Progress Notes (Signed)
Cardiology Office Note  Date:  02/15/2018   ID:  [name] [name], DOB Dec 23, 1960, MRN [id number]  PCP:  Tracie Harrier, MD   Chief Complaint  Patient presents with  . other    6 month f/u c/o irregular heart beat, dizziness and left arm/leg numbness. Meds reviewed verbally with pt.    HPI:  [name] [name] is a 57 year old gentleman with history of  diabetes, poorly controlled with poor diet, finances limiting medication compliance hyperlipidemia,  coronary artery disease with bypass surgery x4 at the end of June 2015 with Dr. Cyndia Bent, orthostatic hypotension  EF at first <20%, 05/2017: EF 40 to 45% who presents for routine follow-up Of his coronary artery disease, history of bypass  Issues staying on his medications, finances problems affording insulin In follow-up today he reports having numbness and tingling left hand and leg Concerned it might be his heart and blockage Also reports having chronic low left back pain , though improved after antibiotic provided by memory care  Thinks that maybe he had an infection that cleared up  Not very active, limited by his feet  Asymptomatic orthostasis Drop in his numbers today from 160 supine down to 130 standing over 80 with no improvement after 3 minutes  Lab work reviewed with him Total chol 122, HBA1C 12.9, hemoglobin A1c up from 9 Ran out of medications  Potassium high, greater than 5 Was given medication by  Nephrology, unable to afford it Off losartan Creatinine stable 1.9 up to 2  EKG personally reviewed by myself on todays visit Shows normal sinus rhythm rate 77 bpm old anterior MI nonspecific T wave abnormality old inferior MI  Other past medical history reviewed  history in October 2014 of MRSA, requiring amputation of several of his toes, severe renal dysfunction at that time felt secondary to antibiotics with subsequent improvement of his renal function. Prior echocardiogram September 2014 showing ejection fraction less  than 20% ejection fraction was 30-35% by echo 08/02/2014  cardiac catheterization may 2015 showing 70% left main disease, 70% diagonal #2 disease, 80% proximal circumflex disease, 80% mid circumflex disease, 99% proximal RCA disease, 99% PL branch disease, ejection fraction 40%    PMH:   has a past medical history of CAD (coronary artery disease), Chronic systolic CHF (congestive heart failure) ([place]), CKD (chronic kidney disease), stage III ([place]), Diabetic foot ulcers ([place]), Hypercholesterolemia, Hypertensive heart disease, Ischemic cardiomyopathy, Myocardial infarction ([place]), Neuropathy in diabetes ([place]), Open wound, Orthostatic hypotension, Peripheral vascular disease ([place]), and Type II diabetes mellitus ([place]).  PSH:    Past Surgical History:  Procedure Laterality Date  . ACHILLES TENDON SURGERY Right 01/22/2017   Procedure: ACHILLES LENGTHENING/KIDNER/TAL/Teno achilles lengthening;  Surgeon: Samara Deist, DPM;  Location: [facility];  Service: Podiatry;  Laterality: Right;  . CARDIAC CATHETERIZATION     03/2014  . CATARACT EXTRACTION    . CORONARY ARTERY BYPASS GRAFT N/A 05/07/2014   Procedure: CORONARY ARTERY BYPASS GRAFTING (CABG);  Surgeon: Gaye Pollack, MD;  Location: [facility];  Service: Open Heart Surgery;  Laterality: N/A;  Times 4 using left internal mammary artery and endoscopically harvested right saphenous vein  . INTRAOPERATIVE TRANSESOPHAGEAL ECHOCARDIOGRAM N/A 05/07/2014   Procedure: INTRAOPERATIVE TRANSESOPHAGEAL ECHOCARDIOGRAM;  Surgeon: Gaye Pollack, MD;  Location: [facility];  Service: Open Heart Surgery;  Laterality: N/A;  . left foot osteomyelitis and wound after surgery    . TOE AMPUTATION     Left 3 and 4 toes  . TONSILECTOMY/ADENOIDECTOMY WITH MYRINGOTOMY  Current Outpatient Medications  Medication Sig Dispense Refill  . aspirin EC 81 MG tablet Take 81 mg by mouth daily.    Marland Kitchen atorvastatin (LIPITOR) 40 MG tablet TAKE 1 TABLET EVERY DAY 90 tablet 3  . carvedilol (COREG)  12.5 MG tablet TAKE 1 TABLET TWO TIMES DAILY WITH A MEAL 180 tablet 3  . ciprofloxacin (CIPRO) 500 MG tablet Take 500 mg by mouth 2 (two) times daily.     . fludrocortisone (FLORINEF) 0.1 MG tablet Take 0.1 mg by mouth daily.     Marland Kitchen glimepiride (AMARYL) 4 MG tablet Take 4 mg by mouth 2 (two) times daily.    Marland Kitchen HYDROcodone-acetaminophen (NORCO) 5-325 MG tablet Take 1 tablet by mouth every 6 (six) hours as needed for moderate pain. 30 tablet 0  . insulin aspart (NOVOLOG FLEXPEN) 100 UNIT/ML FlexPen Inject 0-16 Units into the skin 3 (three) times daily with meals. Sliding scale insulin    . isosorbide mononitrate (IMDUR) 30 MG 24 hr tablet TAKE 1 TABLET EVERY DAY 90 tablet 3  . metroNIDAZOLE (FLAGYL) 500 MG tablet Take 500 mg by mouth 2 (two) times daily.     . nitroGLYCERIN (NITROSTAT) 0.4 MG SL tablet Place 1 tablet (0.4 mg total) under the tongue every 5 (five) minutes as needed for chest pain. 30 tablet 1  . zolpidem (AMBIEN) 5 MG tablet Take 5 mg by mouth at bedtime as needed for sleep.     No current facility-administered medications for this visit.      Allergies:   Other   Social History:  The patient  reports that he has never smoked. He has never used smokeless tobacco. He reports that he does not drink alcohol or use drugs.   Family History:   family history includes Diabetes type II in his unknown relative; Heart disease in his father; Hypertension in his unknown relative; Kidney disease in his unknown relative.    Review of Systems: Review of Systems  Constitutional: Negative.   Respiratory: Negative.   Cardiovascular: Positive for chest pain.       Left arm tingling  Gastrointestinal: Negative.   Musculoskeletal: Negative.        Gait instability secondary to toe amputation  Skin:       Nonhealing ulcer on his foot  Neurological: Negative.   Psychiatric/Behavioral: Negative.   All other systems reviewed and are negative.    PHYSICAL EXAM: VS:  BP (!) 152/90 (BP  Location: Left Arm, Patient Position: Sitting, Cuff Size: Large)   Pulse 77   Ht 6\' 1"  (1.854 m)   Wt 270 lb 12 oz (122.8 kg)   BMI 35.72 kg/m  , BMI Body mass index is 35.72 kg/m. Constitutional:  oriented to person, place, and time. No distress.  HENT:  Head: Normocephalic and atraumatic.  Eyes:  no discharge. No scleral icterus.  Neck: Normal range of motion. Neck supple. No JVD present.  Cardiovascular: Normal rate, regular rhythm, normal heart sounds and intact distal pulses. Exam reveals no gallop and no friction rub. No edema No murmur heard. Pulmonary/Chest: Effort normal and breath sounds normal. No stridor. No respiratory distress.  no wheezes.  no rales.  no tenderness.  Abdominal: Soft.  no distension.  no tenderness.  Musculoskeletal: Normal range of motion.  no  tenderness or deformity.  Neurological:  normal muscle tone. Coordination normal. No atrophy Skin: Skin is warm and dry. No rash noted. not diaphoretic.  Psychiatric:  normal mood and affect. behavior is normal. Thought  content normal.    Recent Labs: No results found for requested labs within last 8760 hours.    Lipid Panel No results found for: CHOL, HDL, LDLCALC, TRIG    Wt Readings from Last 3 Encounters:  02/15/18 270 lb 12 oz (122.8 kg)  01/22/17 280 lb (127 kg)  01/20/17 280 lb (127 kg)       ASSESSMENT AND PLAN:  Coronary artery disease involving native coronary artery of native heart without angina pectoris - Plan: EKG 12-Lead, ECHOCARDIOGRAM COMPLETE Having left arm pain, known coronary disease  unable to exercise secondary to toe amputations He is concerned about blockages We have arranged pharmacological Myoview  Chronic systolic CHF (congestive heart failure) ([place]) - Appears relatively euvolemic on today's visit Ejection fraction up to 40-45% July 2018 Denies any change in weight, no leg edema  Ischemic cardiomyopathy - Plan: EKG 12-Lead, ECHOCARDIOGRAM COMPLETE Appears relatively  euvolemic, Stress test as above  Orthostatic hypotension We will not adjust his medications given 30 point blood pressure drop This is not new, recommended hydration Losartan on hold given hyperkalemia  Hypertensive heart disease with heart failure ([place]) Blood pressure is well controlled on today's visit. No changes made to the medications.  Hypercholesterolemia Cholesterol is at goal on the current lipid regimen. No changes to the medications were made. Stable  Type 2 diabetes mellitus with complication, with long-term current use of insulin (HCC) Long discussion concerning his diabetes Long history of dietary noncompliance, inability to afford his medications Hemoglobin A1c dangerously high 13 He reports it is down, now back on his medications Likely with significant neuropathy explaining many of his symptoms  S/P CABG x 4 Prior surgical anatomy discussed with him Stressed importance of more aggressive diabetes control Stress test as above  Stage 3 chronic kidney disease  work aggressively on his diabetes Avoid NSAIDs, follow-up with nephrology Losartan on hold given high potassium  Chronic osteomyelitis of toe of left foot ([place]) amputation left foot, previous ulcer right foot   poorly controlled diabetes   Total encounter time more than 45 minutes  Greater than 50% was spent in counseling and coordination of care with the patient  Disposition:   F/U  12 months   Orders Placed This Encounter  Procedures  . EKG 12-Lead     Signed, Esmond Plants, M.D., Ph.D. 02/15/2018  [place], [place]

## 2018-02-16 ENCOUNTER — Ambulatory Visit
Admission: RE | Admit: 2018-02-16 | Discharge: 2018-02-16 | Disposition: A | Discharge status: Home / Self Care | Payer: Medicare HMO | Source: Ambulatory Visit | Attending: Internal Medicine | Admitting: Internal Medicine

## 2018-02-16 DIAGNOSIS — E118 Type 2 diabetes mellitus with unspecified complications: Secondary | ICD-10-CM

## 2018-02-16 DIAGNOSIS — I255 Ischemic cardiomyopathy: Secondary | ICD-10-CM | POA: Insufficient documentation

## 2018-02-16 DIAGNOSIS — I700 Atherosclerosis of aorta: Secondary | ICD-10-CM | POA: Diagnosis not present

## 2018-02-16 DIAGNOSIS — I25810 Atherosclerosis of coronary artery bypass graft(s) without angina pectoris: Secondary | ICD-10-CM

## 2018-02-16 DIAGNOSIS — R109 Unspecified abdominal pain: Secondary | ICD-10-CM | POA: Insufficient documentation

## 2018-02-16 DIAGNOSIS — Z794 Long term (current) use of insulin: Secondary | ICD-10-CM

## 2018-02-16 DIAGNOSIS — R935 Abnormal findings on diagnostic imaging of other abdominal regions, including retroperitoneum: Secondary | ICD-10-CM | POA: Insufficient documentation

## 2018-02-21 ENCOUNTER — Encounter
Admission: RE | Admit: 2018-02-21 | Discharge: 2018-02-21 | Disposition: A | Discharge status: Home / Self Care | Payer: Medicare HMO | Source: Ambulatory Visit | Attending: Cardiovascular Disease | Admitting: Cardiovascular Disease

## 2018-02-21 DIAGNOSIS — I2510 Atherosclerotic heart disease of native coronary artery without angina pectoris: Secondary | ICD-10-CM | POA: Insufficient documentation

## 2018-02-21 LAB — NM MYOCAR MULTI W/SPECT W/WALL MOTION / EF
Estimated workload: 1 METS
Exercise duration (min): 0 min
Exercise duration (sec): 0 s
LV dias vol: 182 mL (ref 62–150)
LV sys vol: 111 mL
MPHR: 166 {beats}/min
Peak HR: 92 {beats}/min
Percent HR: 55 %
Rest HR: 81 {beats}/min
SDS: 7
SRS: 11
SSS: 12
TID: 1.01

## 2018-02-21 MED ORDER — TECHNETIUM TC 99M TETROFOSMIN IV KIT
13.8800 | PACK | Freq: Once | INTRAVENOUS | Status: AC | PRN
  Administered 2018-02-21: 13.88 via INTRAVENOUS

## 2018-02-21 MED ORDER — REGADENOSON 0.4 MG/5ML IV SOLN
0.4000 mg | Freq: Once | INTRAVENOUS | Status: AC
  Administered 2018-02-21: 0.4 mg via INTRAVENOUS

## 2018-02-21 MED ORDER — TECHNETIUM TC 99M TETROFOSMIN IV KIT
32.8400 | PACK | Freq: Once | INTRAVENOUS | Status: AC | PRN
  Administered 2018-02-21: 32.84 via INTRAVENOUS

## 2018-02-24 ENCOUNTER — Telehealth: Payer: Self-pay | Admitting: Cardiovascular Disease

## 2018-02-24 NOTE — Telephone Encounter (Signed)
Patient wants to discuss nm testing results.

## 2018-02-24 NOTE — Telephone Encounter (Signed)
Myoview in Dr. Donivan Scull basket. Notified patient. Will make MD aware that patient would like results as soon as possible.

## 2018-02-25 NOTE — Telephone Encounter (Signed)
Results have been released to [place].

## 2018-05-04 DIAGNOSIS — E118 Type 2 diabetes mellitus with unspecified complications: Secondary | ICD-10-CM | POA: Diagnosis not present

## 2018-05-04 DIAGNOSIS — Z794 Long term (current) use of insulin: Secondary | ICD-10-CM | POA: Diagnosis not present

## 2018-05-04 DIAGNOSIS — I255 Ischemic cardiomyopathy: Secondary | ICD-10-CM | POA: Diagnosis not present

## 2018-05-04 DIAGNOSIS — N183 Chronic kidney disease, stage 3 (moderate): Secondary | ICD-10-CM | POA: Diagnosis not present

## 2018-05-04 DIAGNOSIS — I25810 Atherosclerosis of coronary artery bypass graft(s) without angina pectoris: Secondary | ICD-10-CM | POA: Diagnosis not present

## 2018-05-11 ENCOUNTER — Other Ambulatory Visit: Payer: Self-pay | Admitting: Cardiovascular Disease

## 2018-05-11 DIAGNOSIS — E113511 Type 2 diabetes mellitus with proliferative diabetic retinopathy with macular edema, right eye: Secondary | ICD-10-CM | POA: Diagnosis not present

## 2018-05-11 DIAGNOSIS — E113592 Type 2 diabetes mellitus with proliferative diabetic retinopathy without macular edema, left eye: Secondary | ICD-10-CM | POA: Diagnosis not present

## 2018-05-12 DIAGNOSIS — I25810 Atherosclerosis of coronary artery bypass graft(s) without angina pectoris: Secondary | ICD-10-CM | POA: Diagnosis not present

## 2018-05-12 DIAGNOSIS — Z794 Long term (current) use of insulin: Secondary | ICD-10-CM | POA: Diagnosis not present

## 2018-05-12 DIAGNOSIS — D649 Anemia, unspecified: Secondary | ICD-10-CM | POA: Diagnosis not present

## 2018-05-12 DIAGNOSIS — I255 Ischemic cardiomyopathy: Secondary | ICD-10-CM | POA: Diagnosis not present

## 2018-05-12 DIAGNOSIS — I5022 Chronic systolic (congestive) heart failure: Secondary | ICD-10-CM | POA: Diagnosis not present

## 2018-05-12 DIAGNOSIS — Z0000 Encounter for general adult medical examination without abnormal findings: Secondary | ICD-10-CM | POA: Diagnosis not present

## 2018-05-12 DIAGNOSIS — N183 Chronic kidney disease, stage 3 (moderate): Secondary | ICD-10-CM | POA: Diagnosis not present

## 2018-05-12 DIAGNOSIS — Z1211 Encounter for screening for malignant neoplasm of colon: Secondary | ICD-10-CM | POA: Diagnosis not present

## 2018-05-12 DIAGNOSIS — E118 Type 2 diabetes mellitus with unspecified complications: Secondary | ICD-10-CM | POA: Diagnosis not present

## 2018-05-18 DIAGNOSIS — E113592 Type 2 diabetes mellitus with proliferative diabetic retinopathy without macular edema, left eye: Secondary | ICD-10-CM | POA: Diagnosis not present

## 2018-06-09 DIAGNOSIS — E113511 Type 2 diabetes mellitus with proliferative diabetic retinopathy with macular edema, right eye: Secondary | ICD-10-CM | POA: Diagnosis not present

## 2018-06-16 DIAGNOSIS — E113592 Type 2 diabetes mellitus with proliferative diabetic retinopathy without macular edema, left eye: Secondary | ICD-10-CM | POA: Diagnosis not present

## 2018-07-11 DIAGNOSIS — E113511 Type 2 diabetes mellitus with proliferative diabetic retinopathy with macular edema, right eye: Secondary | ICD-10-CM | POA: Diagnosis not present

## 2018-07-15 DIAGNOSIS — E113592 Type 2 diabetes mellitus with proliferative diabetic retinopathy without macular edema, left eye: Secondary | ICD-10-CM | POA: Diagnosis not present

## 2018-08-17 DIAGNOSIS — H43813 Vitreous degeneration, bilateral: Secondary | ICD-10-CM | POA: Diagnosis not present

## 2018-08-17 DIAGNOSIS — E113511 Type 2 diabetes mellitus with proliferative diabetic retinopathy with macular edema, right eye: Secondary | ICD-10-CM | POA: Diagnosis not present

## 2018-08-18 DIAGNOSIS — E113511 Type 2 diabetes mellitus with proliferative diabetic retinopathy with macular edema, right eye: Secondary | ICD-10-CM | POA: Diagnosis not present

## 2018-09-05 DIAGNOSIS — Z794 Long term (current) use of insulin: Secondary | ICD-10-CM | POA: Diagnosis not present

## 2018-09-05 DIAGNOSIS — I25810 Atherosclerosis of coronary artery bypass graft(s) without angina pectoris: Secondary | ICD-10-CM | POA: Diagnosis not present

## 2018-09-05 DIAGNOSIS — I5022 Chronic systolic (congestive) heart failure: Secondary | ICD-10-CM | POA: Diagnosis not present

## 2018-09-05 DIAGNOSIS — N183 Chronic kidney disease, stage 3 (moderate): Secondary | ICD-10-CM | POA: Diagnosis not present

## 2018-09-05 DIAGNOSIS — E118 Type 2 diabetes mellitus with unspecified complications: Secondary | ICD-10-CM | POA: Diagnosis not present

## 2018-09-05 DIAGNOSIS — Z1211 Encounter for screening for malignant neoplasm of colon: Secondary | ICD-10-CM | POA: Diagnosis not present

## 2018-09-05 DIAGNOSIS — I255 Ischemic cardiomyopathy: Secondary | ICD-10-CM | POA: Diagnosis not present

## 2018-09-12 DIAGNOSIS — E7800 Pure hypercholesterolemia, unspecified: Secondary | ICD-10-CM | POA: Diagnosis not present

## 2018-09-12 DIAGNOSIS — E1140 Type 2 diabetes mellitus with diabetic neuropathy, unspecified: Secondary | ICD-10-CM | POA: Diagnosis not present

## 2018-09-12 DIAGNOSIS — E1122 Type 2 diabetes mellitus with diabetic chronic kidney disease: Secondary | ICD-10-CM | POA: Diagnosis not present

## 2018-09-12 DIAGNOSIS — I5022 Chronic systolic (congestive) heart failure: Secondary | ICD-10-CM | POA: Diagnosis not present

## 2018-09-12 DIAGNOSIS — R05 Cough: Secondary | ICD-10-CM | POA: Diagnosis not present

## 2018-09-12 DIAGNOSIS — Z6835 Body mass index (BMI) 35.0-35.9, adult: Secondary | ICD-10-CM | POA: Diagnosis not present

## 2018-09-12 DIAGNOSIS — I25810 Atherosclerosis of coronary artery bypass graft(s) without angina pectoris: Secondary | ICD-10-CM | POA: Diagnosis not present

## 2018-09-12 DIAGNOSIS — I255 Ischemic cardiomyopathy: Secondary | ICD-10-CM | POA: Diagnosis not present

## 2018-09-12 DIAGNOSIS — N183 Chronic kidney disease, stage 3 (moderate): Secondary | ICD-10-CM | POA: Diagnosis not present

## 2018-09-30 ENCOUNTER — Telehealth: Payer: Self-pay | Admitting: Cardiovascular Disease

## 2018-09-30 NOTE — Telephone Encounter (Signed)
Spoke with patient and he feels that he his having some congestive heart failure symptoms. Cough, tired, increased weight, and is very short of breath with dizziness when out and about. He reports weight was 266 and now up to 271. Reviewed that previous weight was 270 here at his last visit in April. He wants to be seen today and advised that I am not sure what our availability is and will have scheduling call him back. Advised that he may see APP or other provider if anything is open. He verbalized understanding and wanted to know how soon he could get in and again let him know that they would need to give him a call. Reviewed that he may need to go to ED if we do not have anything today or if his symptoms persist or worsen. He verbalized understanding with no further questions at this time.

## 2018-09-30 NOTE — Telephone Encounter (Signed)
Pt c/o Shortness Of Breath: STAT if SOB developed within the last 24 hours or pt is noticeably SOB on the phone  1. Are you currently SOB (can you hear that pt is SOB on the phone)? No, patient is sitting down 2. How long have you been experiencing SOB? Last month  3. Are you SOB when sitting or when up moving around?  Up moving around  4. Are you currently experiencing any other symptoms? coughing

## 2018-10-01 NOTE — Progress Notes (Deleted)
Cardiology Office Note Date:  10/01/2018  Patient ID:  [name] [name], [name] [name] 03-Aug-1961, MRN [id number] PCP:  Tracie Harrier, MD  Cardiologist:  Dr. Rockey Situ, MD  ***refresh   Chief Complaint: ***  History of Present Illness: [name] [name] is a 57 y.o. male with history of CAD s/p 4-vessel CABG with LIMA to LAD, SVG to diagonal, SVG to OM, SVG to PDA in 04/2014, HFrEF secondary to ICM, orthostatic hypotension, poorly controlled IDDM complicated by diabetic foot ulcers with MRSA requiring amputation of toes on left foot, CKD stage III with chronic hyperkalemia, HTN, HLD, and medication noncompliance limited by finances who presents for ***.  Echo in 07/2013 showed an EF of less than 20%, moderately dilated LV, moderate pleural effusion in the left lateral region, mild mitral regurgitation, mildly elevated PASP, mild tricuspid regurgitation.  Nuclear stress test in 07/2013 showed no evidence of significant ischemia with a fixed defect of the inferior and septal wall that was large in size without reversibility.  He underwent cardiac catheterization in 03/2014 that showed 70% left main stenosis, 70% D2 stenosis, 80% proximal LCx stenosis, 80% mid LCx stenosis, 99% proximal RCA stenosis, 99% PL branch stenosis, EF 40%.  Following this, he underwent successful four-vessel bypass surgery in 04/2014 as outlined above.  Follow-up echocardiogram in 07/2014 showed an improvement in his LVEF with an EF of 30 to 35%, mild concentric LVH, diffuse hypokinesis, grade 1 diastolic dysfunction, mildly dilated left atrium, RV SF normal, PASP normal.  Most recent echocardiogram from 05/2017 showed an EF of 40 to 45%, unable to exclude regional wall motion and normalities, grade 1 diastolic dysfunction, mild mitral regurgitation, mildly dilated left atrium, RV systolic function normal, PASP normal.  Patient was last seen in the office in 02/2018 continued to note issues staying on his medications secondary to finances.  He  also noted numbness and tingling in the left hand and leg and was concerned that this may be from ischemic heart disease.  Because of this, he underwent Lexiscan Myoview on 02/21/2018 that showed a large defect of moderate severity present in the basal inferior, basal inferior lateral, mid inferior, mid inferolateral, and apical inferior location.  These findings were consistent with prior MI with mild peri-infarct ischemia, LVEF 30 to 44%.  This was an intermediate risk study.  This was felt to look similar to his prior stress test from 2014.  Patient called the office on 09/30/2018 noting cough, fatigue, shortness of breath, and weight gain.  There was associated dizziness.  Weight had trended from 266 pounds to 271 pounds, over uncertain time frame.  Of note, patient's weight was 270 pounds when he was last evaluated in our office in 02/2018.  Labs: 11/2017 - Hgb 14.3, potassium 5.6, serum creatinine 1.9, glucose 217, LFT normal, hemoglobin A1c 12.9, LDL 69, TSH normal  ***  Past Medical History:  Diagnosis Date  . CAD (coronary artery disease)    a. 04/2014 CABG x 4 (LIMA->LAD, VG->Diag, VG->OM, VG->PDA).  . Chronic systolic CHF (congestive heart failure) ([place])    a. 07/2014 Echo: EF 30-35%.  . CKD (chronic kidney disease), stage III ([place])    a. 12/2015 Creat 1.8.  . Diabetic foot ulcers ([place])    a. left foot 2nd digit ant 5 th digit 05/03/14; b. 08/2014 s/p amputation of toes on L foot.  . Hypercholesterolemia    a. 12/2015 TC 116, TG 154, HDL 24, LDL 61-->Atorvastatin 40.  Marland Kitchen Hypertensive heart disease   . Ischemic  cardiomyopathy    a. 07/2013 Echo: EF 20%; b. 07/2014 Echo: EF 30-35%, diff HK, Gr1 DD, mildly dil LA, nl PASP.  Marland Kitchen Myocardial infarction ([place])   . Neuropathy in diabetes ([place])   . Open wound    foot  . Orthostatic hypotension   . Peripheral vascular disease ([place])   . Type II diabetes mellitus ([place])    a. 12/2015 HbA1c = 13.9.    Past Surgical History:  Procedure Laterality Date   . ACHILLES TENDON SURGERY Right 01/22/2017   Procedure: ACHILLES LENGTHENING/KIDNER/TAL/Teno achilles lengthening;  Surgeon: Samara Deist, DPM;  Location: [facility] ORS;  Service: Podiatry;  Laterality: Right;  . CARDIAC CATHETERIZATION     03/2014  . CATARACT EXTRACTION    . CORONARY ARTERY BYPASS GRAFT N/A 05/07/2014   Procedure: CORONARY ARTERY BYPASS GRAFTING (CABG);  Surgeon: Gaye Pollack, MD;  Location: [place];  Service: Open Heart Surgery;  Laterality: N/A;  Times 4 using left internal mammary artery and endoscopically harvested right saphenous vein  . INTRAOPERATIVE TRANSESOPHAGEAL ECHOCARDIOGRAM N/A 05/07/2014   Procedure: INTRAOPERATIVE TRANSESOPHAGEAL ECHOCARDIOGRAM;  Surgeon: Gaye Pollack, MD;  Location: [facility];  Service: Open Heart Surgery;  Laterality: N/A;  . left foot osteomyelitis and wound after surgery    . TOE AMPUTATION     Left 3 and 4 toes  . TONSILECTOMY/ADENOIDECTOMY WITH MYRINGOTOMY      No outpatient medications have been marked as taking for the 10/05/18 encounter (Appointment) with Rise Mu, PA-C.    Allergies:   Other   Social History:  The patient  reports that he has never smoked. He has never used smokeless tobacco. He reports that he does not drink alcohol or use drugs.   Family History:  The patient's family history includes Diabetes type II in his unknown relative; Heart disease in his father; Hypertension in his unknown relative; Kidney disease in his unknown relative.  ROS:   ROS   PHYSICAL EXAM: *** VS:  There were no vitals taken for this visit. BMI: There is no height or weight on file to calculate BMI.  Physical Exam   EKG:  Was ordered and interpreted by me today. Shows ***  Recent Labs: No results found for requested labs within last 8760 hours.  No results found for requested labs within last 8760 hours.   CrCl cannot be calculated (Patient's most recent lab result is older than the maximum 21 days allowed.).   Wt Readings from Last  3 Encounters:  02/15/18 270 lb 12 oz (122.8 kg)  01/22/17 280 lb (127 kg)  01/20/17 280 lb (127 kg)     Other studies reviewed: Additional studies/records reviewed today include: summarized above  ASSESSMENT AND PLAN:  1. ***  Disposition: F/u with Dr. Rockey Situ or an APP in ***  Current medicines are reviewed at length with the patient today.  The patient did not have any concerns regarding medicines.  Signed, Christell Faith, PA-C 10/01/2018 3:02 PM     [facility] [address] [place], [place] [address] [phone]

## 2018-10-04 ENCOUNTER — Encounter: Payer: Self-pay | Admitting: Emergency Medicine

## 2018-10-04 ENCOUNTER — Emergency Department: Payer: Medicare HMO

## 2018-10-04 ENCOUNTER — Inpatient Hospital Stay
Admission: EM | Admit: 2018-10-04 | Discharge: 2018-10-07 | DRG: 291 | Disposition: A | Discharge status: Home / Self Care | Payer: Medicare HMO | Attending: Specialist | Admitting: Specialist

## 2018-10-04 ENCOUNTER — Other Ambulatory Visit: Payer: Self-pay

## 2018-10-04 DIAGNOSIS — Z888 Allergy status to other drugs, medicaments and biological substances status: Secondary | ICD-10-CM | POA: Diagnosis not present

## 2018-10-04 DIAGNOSIS — I110 Hypertensive heart disease with heart failure: Secondary | ICD-10-CM | POA: Diagnosis not present

## 2018-10-04 DIAGNOSIS — I255 Ischemic cardiomyopathy: Secondary | ICD-10-CM | POA: Diagnosis not present

## 2018-10-04 DIAGNOSIS — E1165 Type 2 diabetes mellitus with hyperglycemia: Secondary | ICD-10-CM | POA: Diagnosis present

## 2018-10-04 DIAGNOSIS — Z951 Presence of aortocoronary bypass graft: Secondary | ICD-10-CM | POA: Diagnosis not present

## 2018-10-04 DIAGNOSIS — Z7982 Long term (current) use of aspirin: Secondary | ICD-10-CM

## 2018-10-04 DIAGNOSIS — R0603 Acute respiratory distress: Secondary | ICD-10-CM | POA: Diagnosis not present

## 2018-10-04 DIAGNOSIS — Z794 Long term (current) use of insulin: Secondary | ICD-10-CM | POA: Diagnosis not present

## 2018-10-04 DIAGNOSIS — M7989 Other specified soft tissue disorders: Secondary | ICD-10-CM | POA: Diagnosis not present

## 2018-10-04 DIAGNOSIS — I2510 Atherosclerotic heart disease of native coronary artery without angina pectoris: Secondary | ICD-10-CM | POA: Diagnosis present

## 2018-10-04 DIAGNOSIS — J90 Pleural effusion, not elsewhere classified: Secondary | ICD-10-CM | POA: Diagnosis not present

## 2018-10-04 DIAGNOSIS — Z8249 Family history of ischemic heart disease and other diseases of the circulatory system: Secondary | ICD-10-CM

## 2018-10-04 DIAGNOSIS — N183 Chronic kidney disease, stage 3 (moderate): Secondary | ICD-10-CM | POA: Diagnosis present

## 2018-10-04 DIAGNOSIS — E1151 Type 2 diabetes mellitus with diabetic peripheral angiopathy without gangrene: Secondary | ICD-10-CM | POA: Diagnosis present

## 2018-10-04 DIAGNOSIS — Z79899 Other long term (current) drug therapy: Secondary | ICD-10-CM

## 2018-10-04 DIAGNOSIS — E785 Hyperlipidemia, unspecified: Secondary | ICD-10-CM | POA: Diagnosis present

## 2018-10-04 DIAGNOSIS — I25118 Atherosclerotic heart disease of native coronary artery with other forms of angina pectoris: Secondary | ICD-10-CM | POA: Diagnosis not present

## 2018-10-04 DIAGNOSIS — E1169 Type 2 diabetes mellitus with other specified complication: Secondary | ICD-10-CM | POA: Diagnosis not present

## 2018-10-04 DIAGNOSIS — I130 Hypertensive heart and chronic kidney disease with heart failure and stage 1 through stage 4 chronic kidney disease, or unspecified chronic kidney disease: Principal | ICD-10-CM | POA: Diagnosis present

## 2018-10-04 DIAGNOSIS — I5043 Acute on chronic combined systolic (congestive) and diastolic (congestive) heart failure: Secondary | ICD-10-CM | POA: Diagnosis not present

## 2018-10-04 DIAGNOSIS — I252 Old myocardial infarction: Secondary | ICD-10-CM | POA: Diagnosis not present

## 2018-10-04 DIAGNOSIS — E875 Hyperkalemia: Secondary | ICD-10-CM | POA: Diagnosis present

## 2018-10-04 DIAGNOSIS — E7800 Pure hypercholesterolemia, unspecified: Secondary | ICD-10-CM | POA: Diagnosis present

## 2018-10-04 DIAGNOSIS — E1122 Type 2 diabetes mellitus with diabetic chronic kidney disease: Secondary | ICD-10-CM | POA: Diagnosis present

## 2018-10-04 DIAGNOSIS — I340 Nonrheumatic mitral (valve) insufficiency: Secondary | ICD-10-CM | POA: Diagnosis not present

## 2018-10-04 DIAGNOSIS — R0602 Shortness of breath: Secondary | ICD-10-CM | POA: Diagnosis not present

## 2018-10-04 DIAGNOSIS — I509 Heart failure, unspecified: Secondary | ICD-10-CM

## 2018-10-04 DIAGNOSIS — Z9111 Patient's noncompliance with dietary regimen: Secondary | ICD-10-CM

## 2018-10-04 DIAGNOSIS — E118 Type 2 diabetes mellitus with unspecified complications: Secondary | ICD-10-CM | POA: Diagnosis not present

## 2018-10-04 DIAGNOSIS — E1142 Type 2 diabetes mellitus with diabetic polyneuropathy: Secondary | ICD-10-CM | POA: Diagnosis not present

## 2018-10-04 DIAGNOSIS — Z9119 Patient's noncompliance with other medical treatment and regimen: Secondary | ICD-10-CM

## 2018-10-04 DIAGNOSIS — I472 Ventricular tachycardia: Secondary | ICD-10-CM | POA: Diagnosis not present

## 2018-10-04 DIAGNOSIS — Z8679 Personal history of other diseases of the circulatory system: Secondary | ICD-10-CM | POA: Diagnosis not present

## 2018-10-04 DIAGNOSIS — N189 Chronic kidney disease, unspecified: Secondary | ICD-10-CM | POA: Diagnosis present

## 2018-10-04 DIAGNOSIS — I5023 Acute on chronic systolic (congestive) heart failure: Secondary | ICD-10-CM | POA: Diagnosis not present

## 2018-10-04 DIAGNOSIS — I951 Orthostatic hypotension: Secondary | ICD-10-CM | POA: Diagnosis present

## 2018-10-04 LAB — BASIC METABOLIC PANEL
ANION GAP: 4 — AB (ref 5–15)
BUN: 32 mg/dL — ABNORMAL HIGH (ref 6–20)
CALCIUM: 8.5 mg/dL — AB (ref 8.9–10.3)
CO2: 25 mmol/L (ref 22–32)
CREATININE: 2.11 mg/dL — AB (ref 0.61–1.24)
Chloride: 113 mmol/L — ABNORMAL HIGH (ref 98–111)
GFR, EST AFRICAN AMERICAN: 39 mL/min — AB (ref >60)
GFR, EST NON AFRICAN AMERICAN: 34 mL/min — AB (ref >60)
GLUCOSE: 150 mg/dL — AB (ref 70–99)
Potassium: 4.9 mmol/L (ref 3.5–5.1)
Sodium: 142 mmol/L (ref 135–145)

## 2018-10-04 LAB — CBC
HCT: 35.2 % — ABNORMAL LOW (ref 39.0–52.0)
Hemoglobin: 11.2 g/dL — ABNORMAL LOW (ref 13.0–17.0)
MCH: 30.6 pg (ref 26.0–34.0)
MCHC: 31.8 g/dL (ref 30.0–36.0)
MCV: 96.2 fL (ref 80.0–100.0)
PLATELETS: 204 10*3/uL (ref 150–400)
RBC: 3.66 MIL/uL — AB (ref 4.22–5.81)
RDW: 14.2 % (ref 11.5–15.5)
WBC: 7.2 10*3/uL (ref 4.0–10.5)
nRBC: 0 % (ref 0.0–0.2)

## 2018-10-04 LAB — TROPONIN I
TROPONIN I: 0.03 ng/mL — AB (ref <0.03)
Troponin I: 0.03 ng/mL (ref <0.03)

## 2018-10-04 LAB — BRAIN NATRIURETIC PEPTIDE: B Natriuretic Peptide: 1648 pg/mL — ABNORMAL HIGH (ref 0.0–100.0)

## 2018-10-04 MED ORDER — LABETALOL HCL 5 MG/ML IV SOLN
10.0000 mg | INTRAVENOUS | Status: DC | PRN
  Administered 2018-10-04 – 2018-10-05 (×3): 10 mg via INTRAVENOUS
  Filled 2018-10-04 (×2): qty 4

## 2018-10-04 MED ORDER — ATORVASTATIN CALCIUM 20 MG PO TABS
40.0000 mg | ORAL_TABLET | Freq: Every day | ORAL | Status: DC
  Administered 2018-10-04 – 2018-10-06 (×3): 40 mg via ORAL
  Filled 2018-10-04 (×3): qty 2

## 2018-10-04 MED ORDER — DOCUSATE SODIUM 100 MG PO CAPS: 100.0000 mg | ORAL_CAPSULE | Freq: Two times a day (BID) | ORAL | Status: DC | PRN

## 2018-10-04 MED ORDER — LOSARTAN POTASSIUM 50 MG PO TABS
50.0000 mg | ORAL_TABLET | Freq: Every day | ORAL | Status: DC
  Administered 2018-10-05 – 2018-10-07 (×3): 50 mg via ORAL
  Filled 2018-10-04 (×3): qty 1

## 2018-10-04 MED ORDER — GLIMEPIRIDE 4 MG PO TABS
4.0000 mg | ORAL_TABLET | Freq: Two times a day (BID) | ORAL | Status: DC
  Administered 2018-10-04: 4 mg via ORAL
  Filled 2018-10-04: qty 1

## 2018-10-04 MED ORDER — ASPIRIN EC 81 MG PO TBEC
81.0000 mg | DELAYED_RELEASE_TABLET | Freq: Every day | ORAL | Status: DC
  Administered 2018-10-05 – 2018-10-07 (×3): 81 mg via ORAL
  Filled 2018-10-04 (×3): qty 1

## 2018-10-04 MED ORDER — CARVEDILOL 12.5 MG PO TABS
12.5000 mg | ORAL_TABLET | Freq: Two times a day (BID) | ORAL | Status: DC
  Administered 2018-10-05 – 2018-10-06 (×3): 12.5 mg via ORAL
  Filled 2018-10-04 (×3): qty 1

## 2018-10-04 MED ORDER — FUROSEMIDE 10 MG/ML IJ SOLN
40.0000 mg | Freq: Once | INTRAMUSCULAR | Status: AC
  Administered 2018-10-04: 40 mg via INTRAVENOUS
  Filled 2018-10-04: qty 4

## 2018-10-04 MED ORDER — FLUDROCORTISONE ACETATE 0.1 MG PO TABS
0.1000 mg | ORAL_TABLET | Freq: Every day | ORAL | Status: DC
  Administered 2018-10-04 – 2018-10-05 (×2): 0.1 mg via ORAL
  Filled 2018-10-04 (×3): qty 1

## 2018-10-04 MED ORDER — LABETALOL HCL 5 MG/ML IV SOLN
INTRAVENOUS | Status: AC
  Administered 2018-10-04: 10 mg via INTRAVENOUS
  Filled 2018-10-04: qty 4

## 2018-10-04 MED ORDER — ZOLPIDEM TARTRATE 5 MG PO TABS
5.0000 mg | ORAL_TABLET | Freq: Every evening | ORAL | Status: DC | PRN
  Filled 2018-10-04: qty 1

## 2018-10-04 MED ORDER — FUROSEMIDE 10 MG/ML IJ SOLN
20.0000 mg | Freq: Three times a day (TID) | INTRAMUSCULAR | Status: DC
  Administered 2018-10-04 – 2018-10-06 (×6): 20 mg via INTRAVENOUS
  Filled 2018-10-04 (×6): qty 2

## 2018-10-04 MED ORDER — NITROGLYCERIN 2 % TD OINT
1.0000 [in_us] | TOPICAL_OINTMENT | Freq: Once | TRANSDERMAL | Status: AC
  Administered 2018-10-04: 1 [in_us] via TOPICAL
  Filled 2018-10-04: qty 1

## 2018-10-04 MED ORDER — INSULIN ASPART 100 UNIT/ML ~~LOC~~ SOLN
0.0000 [IU] | Freq: Three times a day (TID) | SUBCUTANEOUS | Status: DC
  Administered 2018-10-05 (×2): 1 [IU] via SUBCUTANEOUS
  Administered 2018-10-07: 2 [IU] via SUBCUTANEOUS
  Filled 2018-10-04 (×3): qty 1

## 2018-10-04 MED ORDER — HEPARIN SODIUM (PORCINE) 5000 UNIT/ML IJ SOLN
5000.0000 [IU] | Freq: Three times a day (TID) | INTRAMUSCULAR | Status: DC
  Administered 2018-10-05 – 2018-10-07 (×6): 5000 [IU] via SUBCUTANEOUS
  Filled 2018-10-04 (×8): qty 1

## 2018-10-04 MED ORDER — NITROGLYCERIN 0.4 MG SL SUBL: 0.4000 mg | SUBLINGUAL_TABLET | SUBLINGUAL | Status: DC | PRN

## 2018-10-04 MED ORDER — FERROUS SULFATE 325 (65 FE) MG PO TABS
325.0000 mg | ORAL_TABLET | Freq: Every day | ORAL | Status: DC
  Administered 2018-10-05 – 2018-10-07 (×3): 325 mg via ORAL
  Filled 2018-10-04 (×4): qty 1

## 2018-10-04 MED ORDER — ISOSORBIDE MONONITRATE ER 30 MG PO TB24
30.0000 mg | ORAL_TABLET | Freq: Every day | ORAL | Status: DC
  Administered 2018-10-05 – 2018-10-06 (×2): 30 mg via ORAL
  Filled 2018-10-04 (×3): qty 1

## 2018-10-04 MED ORDER — INSULIN ASPART 100 UNIT/ML FLEXPEN: 0.0000 [IU] | PEN_INJECTOR | Freq: Three times a day (TID) | SUBCUTANEOUS | Status: DC

## 2018-10-04 NOTE — Progress Notes (Signed)
Family Meeting Note  Advance Directive:yes  Today a meeting took place with the Patient and spouse.   The following clinical team members were present during this meeting:MD  The following were discussed:Patient's diagnosis: Acute on chronic systolic congestive heart failure, status post CABG, diabetes with complications, chronic kidney disease stage III, peripheral arterial disease and neuropathy due to diabetes, Patient's progosis: Unable to determine and Goals for treatment: Full Code  Additional follow-up to be provided: Cardiology  Time spent during discussion:20 minutes  [name] Basta, MD

## 2018-10-04 NOTE — ED Notes (Signed)
ED Provider at bedside. 

## 2018-10-04 NOTE — ED Provider Notes (Signed)
[facility] Emergency Department Provider Note  ____________________________________________   I have reviewed the triage vital signs and the nursing notes. Where available I have reviewed prior notes and, if possible and indicated, outside hospital notes.    HISTORY  Chief Complaint Leg Swelling and Shortness of Breath    HPI [name] [name] is a 57 y.o. male with a history of chronic CHF, last echo showed an improvement of EF of 40 to 45% which was seen as an improvement 30 to 35% in 2015, has chronic disease, history of diabetes, ischemia, who states he is not on any Lasix or fluid pills.  He has not been on them for over a year because he was "doing well".  Has had a chronic recurrent cough for several months with his PCP thought was possibly an infection but he thought was fluid in his heart.  He was given antibiotics with no improvement.  This is progressed to bilateral lower extremity swelling, left somewhat greater than right, over the last couple days as well as orthopnea and dyspnea on exertion of the point where he cannot do his ADLs very easily.  Patient feels that he is "fluid overloaded".  Denies any chest pain, denies any productive cough no personal family history of PE or DVT. Symptoms are worse when he exerts himself or lies down.  Sitting up is okay however. Also notices blood pressure is up today.  Again no chest pain.  Past Medical History:  Diagnosis Date  . CAD (coronary artery disease)    a. 04/2014 CABG x 4 (LIMA->LAD, VG->Diag, VG->OM, VG->PDA).  . Chronic systolic CHF (congestive heart failure) ([place])    a. 07/2014 Echo: EF 30-35%.  . CKD (chronic kidney disease), stage III ([place])    a. 12/2015 Creat 1.8.  . Diabetic foot ulcers ([place])    a. left foot 2nd digit ant 5 th digit 05/03/14; b. 08/2014 s/p amputation of toes on L foot.  . Hypercholesterolemia    a. 12/2015 TC 116, TG 154, HDL 24, LDL 61-->Atorvastatin 40.  Marland Kitchen Hypertensive heart  disease   . Ischemic cardiomyopathy    a. 07/2013 Echo: EF 20%; b. 07/2014 Echo: EF 30-35%, diff HK, Gr1 DD, mildly dil LA, nl PASP.  Marland Kitchen Myocardial infarction ([place])   . Neuropathy in diabetes ([place])   . Open wound    foot  . Orthostatic hypotension   . Peripheral vascular disease ([place])   . Type II diabetes mellitus ([place])    a. 12/2015 HbA1c = 13.9.    Patient Active Problem List   Diagnosis Date Noted  . Preop cardiovascular exam 12/10/2016  . Hypertensive heart disease   . Ischemic cardiomyopathy   . Hypercholesterolemia   . CKD (chronic kidney disease), stage III ([place])   . Chronic systolic CHF (congestive heart failure) ([place]) 09/19/2014  . Hyperlipidemia 07/31/2014  . Orthostatic hypotension 06/15/2014  . Acute urinary retention 05/21/2014  . E. coli UTI 05/21/2014  . Chronic kidney disease 05/21/2014  . Chronic osteomyelitis of toe of left foot ([place]) 05/21/2014  . Diabetic ulcer of left foot ([place]) 05/21/2014  . Physical deconditioning 05/15/2014  . S/P CABG x 4 05/13/2014  . Diabetes mellitus type 2 with complications ([place]) 82/42/3536  . CAD (coronary artery disease)     Past Surgical History:  Procedure Laterality Date  . ACHILLES TENDON SURGERY Right 01/22/2017   Procedure: ACHILLES LENGTHENING/KIDNER/TAL/Teno achilles lengthening;  Surgeon: Samara Deist, DPM;  Location: [facility] ORS;  Service: Podiatry;  Laterality: Right;  . CARDIAC CATHETERIZATION     03/2014  . CATARACT EXTRACTION    . CORONARY ARTERY BYPASS GRAFT N/A 05/07/2014   Procedure: CORONARY ARTERY BYPASS GRAFTING (CABG);  Surgeon: Gaye Pollack, MD;  Location: [place];  Service: Open Heart Surgery;  Laterality: N/A;  Times 4 using left internal mammary artery and endoscopically harvested right saphenous vein  . INTRAOPERATIVE TRANSESOPHAGEAL ECHOCARDIOGRAM N/A 05/07/2014   Procedure: INTRAOPERATIVE TRANSESOPHAGEAL ECHOCARDIOGRAM;  Surgeon: Gaye Pollack, MD;  Location: [facility] OR;  Service: Open Heart Surgery;   Laterality: N/A;  . left foot osteomyelitis and wound after surgery    . TOE AMPUTATION     Left 3 and 4 toes  . TONSILECTOMY/ADENOIDECTOMY WITH MYRINGOTOMY      Prior to Admission medications   Medication Sig Start Date End Date Taking? Authorizing Provider  aspirin EC 81 MG tablet Take 81 mg by mouth daily.    [provider]  atorvastatin (LIPITOR) 40 MG tablet TAKE 1 TABLET EVERY DAY 05/11/18   Minna Merritts, MD  carvedilol (COREG) 12.5 MG tablet TAKE 1 TABLET TWO TIMES DAILY WITH A MEAL 11/17/17   Gollan, Kathlene November, MD  fludrocortisone (FLORINEF) 0.1 MG tablet Take 0.1 mg by mouth daily.  02/03/18   [provider]  glimepiride (AMARYL) 4 MG tablet Take 4 mg by mouth 2 (two) times daily. 11/20/16   [provider]  HYDROcodone-acetaminophen (NORCO) 5-325 MG tablet Take 1 tablet by mouth every 6 (six) hours as needed for moderate pain. 01/22/17   Samara Deist, DPM  insulin aspart (NOVOLOG FLEXPEN) 100 UNIT/ML FlexPen Inject 0-16 Units into the skin 3 (three) times daily with meals. Sliding scale insulin    [provider]  isosorbide mononitrate (IMDUR) 30 MG 24 hr tablet TAKE 1 TABLET EVERY DAY 11/17/17   Gollan, Kathlene November, MD  nitroGLYCERIN (NITROSTAT) 0.4 MG SL tablet Place 1 tablet (0.4 mg total) under the tongue every 5 (five) minutes as needed for chest pain. 12/24/15   Theora Gianotti, NP  zolpidem (AMBIEN) 5 MG tablet Take 5 mg by mouth at bedtime as needed for sleep.    [provider]    Allergies Other  Family History  Problem Relation Age of Onset  . Heart disease Father        CABG in his 61's  . Diabetes type II Unknown   . Kidney disease Unknown   . Hypertension Unknown     Social History Social History   Tobacco Use  . Smoking status: Never Smoker  . Smokeless tobacco: Never Used  Substance Use Topics  . Alcohol use: No  . Drug use: No    Review of Systems Constitutional: No fever/chills Eyes: No visual  changes. ENT: No sore throat. No stiff neck no neck pain + : Denies chest pain. Respiratory: Denies shortness of breath. Gastrointestinal:   no vomiting.  No diarrhea.  No constipation. Genitourinary: Negative for dysuria. Musculoskeletal: + lower extremity swelling Skin: Negative for rash. Neurological: Negative for severe headaches, focal weakness or numbness.   ____________________________________________   PHYSICAL EXAM:  VITAL SIGNS: ED Triage Vitals  Enc Vitals Group     BP 10/04/18 1725 (!) 191/114     Pulse Rate 10/04/18 1445 84     Resp 10/04/18 1445 16     Temp 10/04/18 1445 97.6 F (36.4 C)     Temp Source 10/04/18 1445 Oral     SpO2 10/04/18 1445 98 %  Weight --      Height --      Head Circumference --      Peak Flow --      Pain Score 10/04/18 1445 0     Pain Loc --      Pain Edu? --      Excl. in [place]? --     Constitutional: Alert and oriented. Well appearing and in no acute distress. Eyes: Conjunctivae are normal Head: Atraumatic HEENT: No congestion/rhinnorhea. Mucous membranes are moist.  Oropharynx non-erythematous Neck:   Nontender with no meningismus, no masses, no stridor Cardiovascular: Normal rate, regular rhythm. Grossly normal heart sounds.  Good peripheral circulation. Respiratory: Normal respiratory effort.  Diminished in the bases with occasional rales Abdominal: Soft and nontender. No distention. No guarding no rebound Back:  There is no focal tenderness or step off.  there is no midline tenderness there are no lesions noted. there is no CVA tenderness Musculoskeletal: No lower extremity tenderness, no upper extremity tenderness. No joint effusions, no DVT signs strong distal pulses there is bilateral symmetric pitting edema, I do not detect any significant difference between left and right  Neurologic:  Normal speech and language. No gross focal neurologic deficits are appreciated.  Skin:  Skin is warm, dry and intact. No rash  noted. Psychiatric: Mood and affect are normal. Speech and behavior are normal.  ____________________________________________   LABS (all labs ordered are listed, but only abnormal results are displayed)  Labs Reviewed  BASIC METABOLIC PANEL - Abnormal; Notable for the following components:      Result Value   Chloride 113 (*)    Glucose, Bld 150 (*)    BUN 32 (*)    Creatinine, Ser 2.11 (*)    Calcium 8.5 (*)    GFR calc non Af Amer 34 (*)    GFR calc Af Amer 39 (*)    Anion gap 4 (*)    All other components within normal limits  CBC - Abnormal; Notable for the following components:   RBC 3.66 (*)    Hemoglobin 11.2 (*)    HCT 35.2 (*)    All other components within normal limits  TROPONIN I - Abnormal; Notable for the following components:   Troponin I 0.03 (*)    All other components within normal limits  BRAIN NATRIURETIC PEPTIDE - Abnormal; Notable for the following components:   B Natriuretic Peptide 1,648.0 (*)    All other components within normal limits  TROPONIN I    Pertinent labs  results that were available during my care of the patient were reviewed by me and considered in my medical decision making (see chart for details). ____________________________________________  EKG  I personally interpreted any EKGs ordered by me or triage Sinus rhythm rate 83 bpm, no acute ST elevation or depression no specific ST changes diffusely noted. ____________________________________________  RADIOLOGY  Pertinent labs & imaging results that were available during my care of the patient were reviewed by me and considered in my medical decision making (see chart for details). If possible, patient and/or family made aware of any abnormal findings.  Dg Chest 2 View  Result Date: 10/04/2018 CLINICAL DATA:  Shortness of breath and left lower extremity swelling. History of CHF, previous MI. Current symptoms began recently. Nonsmoker. History of diabetes and hypertension. EXAM:  CHEST - 2 VIEW COMPARISON:  Chest x-ray of August 25, 2014 FINDINGS: The lungs are adequately inflated. There is mild chronic elevation of the right hemidiaphragm. The interstitial  markings are increased. The cardiac silhouette remains enlarged. The pulmonary vascularity is engorged and more prominent than on the previous study. There are post CABG changes. There is a trace of pleural fluid blunting the posterior costophrenic angles. IMPRESSION: Findings compatible with CHF with mild pulmonary vascular congestion and interstitial edema. Trace bilateral pleural effusions. Electronically Signed   By: David  Martinique M.D.   On: 10/04/2018 15:10   ____________________________________________    PROCEDURES  Procedure(s) performed: None  Procedures  Critical Care performed: None  ____________________________________________   INITIAL IMPRESSION / ASSESSMENT AND PLAN / ED COURSE  Pertinent labs & imaging results that were available during my care of the patient were reviewed by me and considered in my medical decision making (see chart for details).  Patient here with what appears to be decompensated heart failure.  Will get Doppler ultrasound as a precaution as patient feels that there is an asymmetry when he walks around and the swelling of his legs however, he still seems to be consistent with CHF.  Will require IV Lasix, will give him nitro for his elevated blood pressure, and we will admit him to the hospital.  He will require echo and diuresis.    ____________________________________________   FINAL CLINICAL IMPRESSION(S) / ED DIAGNOSES  Final diagnoses:  Acute on chronic congestive heart failure, unspecified heart failure type ([place])      This chart was dictated using voice recognition software.  Despite best efforts to proofread,  errors can occur which can change meaning.      Schuyler Amor, MD 10/04/18 [phone]

## 2018-10-04 NOTE — ED Triage Notes (Signed)
PT c/o SOB and LFT leg swelling, hx of CHF. PT states CHF x4 years but no problems with swelling until recently. NAD noted

## 2018-10-04 NOTE — H&P (Signed)
[facility] NAME: [name] [name]    MR#:  [id number]  DATE OF BIRTH:  03-15-1961  DATE OF ADMISSION:  10/04/2018  PRIMARY CARE PHYSICIAN: [name] Harrier, MD   REQUESTING/REFERRING PHYSICIAN: Mcshane  CHIEF COMPLAINT:   Chief Complaint  Patient presents with  . Leg Swelling  . Shortness of Breath    HISTORY OF PRESENT ILLNESS: [name] [name]  is a 57 y.o. male with a known history of coronary artery disease status post CABG, chronic systolic CHF with ejection fraction 35%, chronic kidney disease stage III with baseline creatinine around 1.8-1.9, diabetes with amputations on the toes, hypercholesterolemia, ischemic cardiomyopathy, diabetic neuropathy, orthostatic hypotension, peripheral vascular disease-had been having difficulty sleeping at night due to cough while lying flat and having swelling on the legs for last few weeks.  This is significantly getting worse and he also feels short of breath while having minimal exertion so came to emergency room.  He denies any significant chest pain or cough or sputum production along with this. Noted to have very high BNP and chest x-ray showed some pulmonary edema. He said he was taken off of Lasix in the past due to his kidney function.  He confirms not drinking too much liquid or eating much salt at home. He was given to admit to hospitalist service for congestive heart failure.  PAST MEDICAL HISTORY:   Past Medical History:  Diagnosis Date  . CAD (coronary artery disease)    a. 04/2014 CABG x 4 (LIMA->LAD, VG->Diag, VG->OM, VG->PDA).  . Chronic systolic CHF (congestive heart failure) ([place])    a. 07/2014 Echo: EF 30-35%.  . CKD (chronic kidney disease), stage III ([place])    a. 12/2015 Creat 1.8.  . Diabetic foot ulcers ([name] [name])    a. left foot 2nd digit ant 5 th digit 05/03/14; b. 08/2014 s/p amputation of toes on L foot.  . Hypercholesterolemia    a. 12/2015 TC 116, TG 154, HDL 24, LDL  61-->Atorvastatin 40.  Marland Kitchen Hypertensive heart disease   . Ischemic cardiomyopathy    a. 07/2013 Echo: EF 20%; b. 07/2014 Echo: EF 30-35%, diff HK, Gr1 DD, mildly dil LA, nl PASP.  Marland Kitchen Myocardial infarction ([place])   . Neuropathy in diabetes ([place])   . Open wound    foot  . Orthostatic hypotension   . Peripheral vascular disease ([place])   . Type II diabetes mellitus ([place])    a. 12/2015 HbA1c = 13.9.    PAST SURGICAL HISTORY:  Past Surgical History:  Procedure Laterality Date  . ACHILLES TENDON SURGERY Right 01/22/2017   Procedure: ACHILLES LENGTHENING/KIDNER/TAL/Teno achilles lengthening;  Surgeon: [name] [name], DPM;  Location: [facility] ORS;  Service: Podiatry;  Laterality: Right;  . CARDIAC CATHETERIZATION     03/2014  . CATARACT EXTRACTION    . CORONARY ARTERY BYPASS GRAFT N/A 05/07/2014   Procedure: CORONARY ARTERY BYPASS GRAFTING (CABG);  Surgeon: [name] Pollack, MD;  Location: [facility];  Service: Open Heart Surgery;  Laterality: N/A;  Times 4 using left internal mammary artery and endoscopically harvested right saphenous vein  . INTRAOPERATIVE TRANSESOPHAGEAL ECHOCARDIOGRAM N/A 05/07/2014   Procedure: INTRAOPERATIVE TRANSESOPHAGEAL ECHOCARDIOGRAM;  Surgeon: [name] Pollack, MD;  Location: [facility] OR;  Service: Open Heart Surgery;  Laterality: N/A;  . left foot osteomyelitis and wound after surgery    . TOE AMPUTATION     Left 3 and 4 toes  . TONSILECTOMY/ADENOIDECTOMY WITH MYRINGOTOMY      SOCIAL HISTORY:  Social History   Tobacco Use  . Smoking status: Never Smoker  . Smokeless tobacco: Never Used  Substance Use Topics  . Alcohol use: No    FAMILY HISTORY:  Family History  Problem Relation Age of Onset  . Heart disease Father        CABG in his 62's  . Diabetes type II Unknown   . Kidney disease Unknown   . Hypertension Unknown     DRUG ALLERGIES:  Allergies  Allergen Reactions  . Other Other (See Comments)    Pt. And wife state that he had a reaction to "some" antibiotic but they do  not know what the name of it was.They are are very apprehensive about him receiving any antibiotic. Zosyn, Clindamycin, & Vancomycin (all started together unsure which caused reaction 07/18/2013)    REVIEW OF SYSTEMS:   CONSTITUTIONAL: No fever, fatigue or weakness.  EYES: No blurred or double vision.  EARS, NOSE, AND THROAT: No tinnitus or ear pain.  RESPIRATORY: Have cough, shortness of breath, no wheezing or hemoptysis.  CARDIOVASCULAR: No chest pain, have orthopnea, edema.  GASTROINTESTINAL: No nausea, vomiting, diarrhea or abdominal pain.  GENITOURINARY: No dysuria, hematuria.  ENDOCRINE: No polyuria, nocturia,  HEMATOLOGY: No anemia, easy bruising or bleeding SKIN: No rash or lesion. MUSCULOSKELETAL: No joint pain or arthritis.   NEUROLOGIC: No tingling, numbness, weakness.  PSYCHIATRY: No anxiety or depression.   MEDICATIONS AT HOME:  Prior to Admission medications   Medication Sig Start Date End Date Taking? Authorizing Provider  aspirin EC 81 MG tablet Take 81 mg by mouth daily.   Yes [provider]  atorvastatin (LIPITOR) 40 MG tablet TAKE 1 TABLET EVERY DAY Patient taking differently: Take 40 mg by mouth at bedtime.  05/11/18  Yes Gollan, [name] November, MD  carvedilol (COREG) 12.5 MG tablet TAKE 1 TABLET TWO TIMES DAILY WITH A MEAL Patient taking differently: Take 12.5 mg by mouth 2 (two) times daily with a meal.  11/17/17  Yes Gollan, [name] November, MD  ferrous sulfate 325 (65 FE) MG tablet Take 325 mg by mouth daily.   Yes [provider]  fludrocortisone (FLORINEF) 0.1 MG tablet Take 0.1 mg by mouth at bedtime.    Yes [provider]  glimepiride (AMARYL) 4 MG tablet Take 4 mg by mouth 2 (two) times daily. 11/20/16  Yes [provider]  insulin aspart (NOVOLOG FLEXPEN) 100 UNIT/ML FlexPen Inject 0-16 Units into the skin 3 (three) times daily with meals. Sliding scale insulin   Yes [provider]  isosorbide mononitrate (IMDUR) 30 MG 24 hr  tablet TAKE 1 TABLET EVERY DAY Patient taking differently: Take 30 mg by mouth daily.  11/17/17  Yes Gollan, [name] November, MD  losartan (COZAAR) 50 MG tablet Take 50 mg by mouth daily.   Yes [provider]  nitroGLYCERIN (NITROSTAT) 0.4 MG SL tablet Place 1 tablet (0.4 mg total) under the tongue every 5 (five) minutes as needed for chest pain. 12/24/15  Yes Theora Gianotti, NP  zolpidem (AMBIEN) 5 MG tablet Take 5 mg by mouth at bedtime as needed for sleep.   Yes [provider]      PHYSICAL EXAMINATION:   VITAL SIGNS: Blood pressure (!) 166/84, pulse 83, temperature 98.3 F (36.8 C), temperature source Oral, resp. rate 18, SpO2 93 %.  GENERAL:  57 y.o.-year-old patient lying in the bed with no acute distress.  EYES: Pupils equal, round, reactive to light and accommodation. No scleral icterus. Extraocular  muscles intact.  HEENT: Head atraumatic, normocephalic. Oropharynx and nasopharynx clear.  NECK:  Supple, no jugular venous distention. No thyroid enlargement, no tenderness.  LUNGS: Normal breath sounds bilaterally, no wheezing, some crepitation. No use of accessory muscles of respiration.  CARDIOVASCULAR: S1, S2 normal. No murmurs, rubs, or gallops.  ABDOMEN: Soft, nontender, nondistended. Bowel sounds present. No organomegaly or mass.  EXTREMITIES: Some bilateral pedal edema, cyanosis, or clubbing.  Amputations on toe. NEUROLOGIC: Cranial nerves II through XII are intact. Muscle strength 5/5 in all extremities. Sensation intact. Gait not checked.  PSYCHIATRIC: The patient is alert and oriented x 3.  SKIN: No obvious rash, lesion, or ulcer.   LABORATORY PANEL:   CBC Recent Labs  Lab 10/04/18 1447  WBC 7.2  HGB 11.2*  HCT 35.2*  PLT 204  MCV 96.2  MCH 30.6  MCHC 31.8  RDW 14.2   ------------------------------------------------------------------------------------------------------------------  Chemistries  Recent Labs  Lab 10/04/18 1447  NA 142   K 4.9  CL 113*  CO2 25  GLUCOSE 150*  BUN 32*  CREATININE 2.11*  CALCIUM 8.5*   ------------------------------------------------------------------------------------------------------------------ CrCl cannot be calculated (Unknown ideal weight.). ------------------------------------------------------------------------------------------------------------------ No results for input(s): TSH, T4TOTAL, T3FREE, THYROIDAB in the last 72 hours.  Invalid input(s): FREET3   Coagulation profile No results for input(s): INR, PROTIME in the last 168 hours. ------------------------------------------------------------------------------------------------------------------- No results for input(s): DDIMER in the last 72 hours. -------------------------------------------------------------------------------------------------------------------  Cardiac Enzymes Recent Labs  Lab 10/04/18 1447 10/04/18 1722  TROPONINI 0.03* 0.03*   ------------------------------------------------------------------------------------------------------------------ Invalid input(s): POCBNP  ---------------------------------------------------------------------------------------------------------------  Urinalysis    Component Value Date/Time   COLORURINE YELLOW 05/18/2014 0659   APPEARANCEUR CLOUDY (A) 05/18/2014 0659   APPEARANCEUR Clear 08/08/2013 1439   LABSPEC 1.020 05/18/2014 0659   LABSPEC 1.006 08/08/2013 1439   PHURINE 7.5 05/18/2014 0659   GLUCOSEU NEGATIVE 05/18/2014 0659   GLUCOSEU Negative 08/08/2013 1439   HGBUR MODERATE (A) 05/18/2014 0659   BILIRUBINUR NEGATIVE 05/18/2014 0659   BILIRUBINUR Negative 08/08/2013 1439   KETONESUR NEGATIVE 05/18/2014 0659   PROTEINUR 100 (A) 05/18/2014 0659   UROBILINOGEN 1.0 05/18/2014 0659   NITRITE POSITIVE (A) 05/18/2014 0659   LEUKOCYTESUR LARGE (A) 05/18/2014 0659   LEUKOCYTESUR Negative 08/08/2013 1439     RADIOLOGY: Dg Chest 2 View  Result Date:  10/04/2018 CLINICAL DATA:  Shortness of breath and left lower extremity swelling. History of CHF, previous MI. Current symptoms began recently. Nonsmoker. History of diabetes and hypertension. EXAM: CHEST - 2 VIEW COMPARISON:  Chest x-ray of August 25, 2014 FINDINGS: The lungs are adequately inflated. There is mild chronic elevation of the right hemidiaphragm. The interstitial markings are increased. The cardiac silhouette remains enlarged. The pulmonary vascularity is engorged and more prominent than on the previous study. There are post CABG changes. There is a trace of pleural fluid blunting the posterior costophrenic angles. IMPRESSION: Findings compatible with CHF with mild pulmonary vascular congestion and interstitial edema. Trace bilateral pleural effusions. Electronically Signed   By: David  Martinique M.D.   On: 10/04/2018 15:10   US Venous Img Lower Bilateral  Result Date: 10/04/2018 CLINICAL DATA:  Lower extremity swelling since earlier today. EXAM: BILATERAL LOWER EXTREMITY VENOUS DOPPLER ULTRASOUND TECHNIQUE: Gray-scale sonography with graded compression, as well as color Doppler and duplex ultrasound were performed to evaluate the lower extremity deep venous systems from the level of the common femoral vein and including the common femoral, femoral, profunda femoral, popliteal and calf veins including the posterior tibial, peroneal and gastrocnemius veins when visible.  The superficial great saphenous vein was also interrogated. Spectral Doppler was utilized to evaluate flow at rest and with distal augmentation maneuvers in the common femoral, femoral and popliteal veins. COMPARISON:  Left lower extremity venous Doppler scan dated 07/20/2013 FINDINGS: RIGHT LOWER EXTREMITY Common Femoral Vein: No evidence of thrombus. Normal compressibility, respiratory phasicity and response to augmentation. Saphenofemoral Junction: No evidence of thrombus. Normal compressibility and flow on color Doppler imaging.  Profunda Femoral Vein: No evidence of thrombus. Normal compressibility and flow on color Doppler imaging. Femoral Vein: No evidence of thrombus. Normal compressibility, respiratory phasicity and response to augmentation. Popliteal Vein: No evidence of thrombus. Normal compressibility, respiratory phasicity and response to augmentation. Calf Veins: No evidence of thrombus. Normal compressibility and flow on color Doppler imaging. Superficial Great Saphenous Vein: No evidence of thrombus. Normal compressibility. Venous Reflux:  None. Other Findings:  None. LEFT LOWER EXTREMITY Common Femoral Vein: No evidence of thrombus. Normal compressibility, respiratory phasicity and response to augmentation. Saphenofemoral Junction: No evidence of thrombus. Normal compressibility and flow on color Doppler imaging. Profunda Femoral Vein: No evidence of thrombus. Normal compressibility and flow on color Doppler imaging. Femoral Vein: No evidence of thrombus. Normal compressibility, respiratory phasicity and response to augmentation. Popliteal Vein: No evidence of thrombus. Normal compressibility, respiratory phasicity and response to augmentation. Calf Veins: No evidence of thrombus. Normal compressibility and flow on color Doppler imaging. Superficial Great Saphenous Vein: No evidence of thrombus. Normal compressibility. Venous Reflux:  None. Other Findings:  None. IMPRESSION: No evidence of deep venous thrombosis in either lower extremity. Electronically Signed   By: Ilona Sorrel M.D.   On: 10/04/2018 18:57    EKG: Orders placed or performed during the hospital encounter of 10/04/18  . ED EKG within 10 minutes  . ED EKG within 10 minutes    IMPRESSION AND PLAN:  *Acute on chronic systolic congestive heart failure Ischemic cardiomyopathy Ejection fraction 35% as per previous records  Give IV Lasix, fluid restriction, intake and output measurement Cardiology consult. Will do echocardiogram. He may benefit by heart  failure clinic referral  *Leg swelling Doppler study was ordered by ER physician which is negative for DVT.  *Diabetes mellitus with complications of chronic renal failure and peripheral arterial disease with amputation Keep on sliding scale coverage. Continue home medications.  *Peripheral neuropathy due to diabetes Continue to monitor.  *Chronic kidney disease stage III due to diabetes Continue to monitor with IV Lasix use.  *Hypertension Continue home medications.  *Orthostatic hypotension Continue Florinef, try to keep the systolic blood pressure more than 150.  *Hyperlipidemia Continue atorvastatin.  *Peripheral arterial disease and atherosclerosis secondary to diabetes and status amputation of toes now Continue aspirin, tight blood sugar control, atorvastatin. His recent HbA1c is better under control than it was in past   All the records are reviewed and case discussed with ED provider. Management plans discussed with the patient, family and they are in agreement.  CODE STATUS: Full. Code Status History    Date Active Date Inactive Code Status Order ID Comments User Context   05/15/2014 1624 05/25/2014 1541 Full Code [id number]  Flora Lipps Inpatient   05/07/2014 1340 05/12/2014 0713 Full Code [id number]  Nani Skillern, PA-C Inpatient     Patient's wife was present in the room during my visit  TOTAL TIME TAKING CARE OF THIS PATIENT: 50 minutes.    Vaughan Basta M.D on 10/04/2018   Between 7am to 6pm - Pager - [phone]  After 6pm go to  [email or web address] - password [email or web address] [facility]  Office  [phone]  CC: Primary care physician; [name] Harrier, MD   Note: This dictation was prepared with Dragon dictation along with smaller phrase technology. Any transcriptional errors that result from this process are unintentional.

## 2018-10-04 NOTE — ED Notes (Addendum)
Admitting physician at bedside

## 2018-10-05 ENCOUNTER — Inpatient Hospital Stay: Payer: Medicare HMO

## 2018-10-05 ENCOUNTER — Ambulatory Visit: Payer: Medicare HMO | Admitting: Physician Assistant

## 2018-10-05 ENCOUNTER — Inpatient Hospital Stay (HOSPITAL_COMMUNITY)
Admit: 2018-10-05 | Discharge: 2018-10-05 | Disposition: A | Discharge status: Home / Self Care | Payer: Medicare HMO | Attending: Internal Medicine | Admitting: Internal Medicine

## 2018-10-05 ENCOUNTER — Encounter

## 2018-10-05 DIAGNOSIS — I5043 Acute on chronic combined systolic (congestive) and diastolic (congestive) heart failure: Secondary | ICD-10-CM

## 2018-10-05 DIAGNOSIS — N183 Chronic kidney disease, stage 3 (moderate): Secondary | ICD-10-CM

## 2018-10-05 DIAGNOSIS — I25118 Atherosclerotic heart disease of native coronary artery with other forms of angina pectoris: Secondary | ICD-10-CM

## 2018-10-05 DIAGNOSIS — I340 Nonrheumatic mitral (valve) insufficiency: Secondary | ICD-10-CM

## 2018-10-05 DIAGNOSIS — I255 Ischemic cardiomyopathy: Secondary | ICD-10-CM

## 2018-10-05 DIAGNOSIS — E118 Type 2 diabetes mellitus with unspecified complications: Secondary | ICD-10-CM

## 2018-10-05 LAB — BASIC METABOLIC PANEL
ANION GAP: 6 (ref 5–15)
BUN: 32 mg/dL — ABNORMAL HIGH (ref 6–20)
CALCIUM: 8.8 mg/dL — AB (ref 8.9–10.3)
CHLORIDE: 112 mmol/L — AB (ref 98–111)
CO2: 27 mmol/L (ref 22–32)
CREATININE: 2.2 mg/dL — AB (ref 0.61–1.24)
GFR calc non Af Amer: 32 mL/min — ABNORMAL LOW (ref >60)
GFR, EST AFRICAN AMERICAN: 37 mL/min — AB (ref >60)
Glucose, Bld: 87 mg/dL (ref 70–99)
Potassium: 4 mmol/L (ref 3.5–5.1)
Sodium: 145 mmol/L (ref 135–145)

## 2018-10-05 LAB — GLUCOSE, CAPILLARY
GLUCOSE-CAPILLARY: 124 mg/dL — AB (ref 70–99)
GLUCOSE-CAPILLARY: 127 mg/dL — AB (ref 70–99)
GLUCOSE-CAPILLARY: 115 mg/dL — AB (ref 70–99)
Glucose-Capillary: 143 mg/dL — ABNORMAL HIGH (ref 70–99)
Glucose-Capillary: 70 mg/dL (ref 70–99)

## 2018-10-05 LAB — HEMOGLOBIN A1C
Hgb A1c MFr Bld: 6.2 % — ABNORMAL HIGH (ref 4.8–5.6)
Mean Plasma Glucose: 131.24 mg/dL

## 2018-10-05 LAB — CBC
HEMATOCRIT: 35.3 % — AB (ref 39.0–52.0)
Hemoglobin: 11.2 g/dL — ABNORMAL LOW (ref 13.0–17.0)
MCH: 30.2 pg (ref 26.0–34.0)
MCHC: 31.7 g/dL (ref 30.0–36.0)
MCV: 95.1 fL (ref 80.0–100.0)
NRBC: 0 % (ref 0.0–0.2)
Platelets: 202 10*3/uL (ref 150–400)
RBC: 3.71 MIL/uL — AB (ref 4.22–5.81)
RDW: 14.1 % (ref 11.5–15.5)
WBC: 6.7 10*3/uL (ref 4.0–10.5)

## 2018-10-05 LAB — ECHOCARDIOGRAM COMPLETE
HEIGHTINCHES: 73 in
WEIGHTICAEL: 4259.2 [oz_av]

## 2018-10-05 MED ORDER — PERFLUTREN LIPID MICROSPHERE
1.0000 mL | INTRAVENOUS | Status: AC | PRN
  Administered 2018-10-05: 3 mL via INTRAVENOUS
  Filled 2018-10-05: qty 10

## 2018-10-05 MED ORDER — CALCIUM CARBONATE ANTACID 500 MG PO CHEW
1.0000 | CHEWABLE_TABLET | Freq: Two times a day (BID) | ORAL | Status: DC | PRN
  Administered 2018-10-05: 200 mg via ORAL
  Filled 2018-10-05: qty 1

## 2018-10-05 MED ORDER — HYDRALAZINE HCL 25 MG PO TABS
25.0000 mg | ORAL_TABLET | Freq: Two times a day (BID) | ORAL | Status: DC
  Administered 2018-10-05 – 2018-10-06 (×3): 25 mg via ORAL
  Filled 2018-10-05 (×3): qty 1

## 2018-10-05 NOTE — Consult Note (Signed)
Cardiology Consultation:   Patient ID: [name] MRN: [id number]; DOB: [birth year]  Admit date: 10/04/2018 Date of Consult: 10/05/2018  Primary Care Provider: Tracie Harrier, MD Primary Cardiologist: Dr. Rockey [name], [facility] Physician requesting consult Dr. Anselm [name]. Reason for consult: Respiratory distress, CHF   Patient Profile:   [name] [name] is a 57 y.o. male with a hx of CAD s/p CABG x4 04/2014, HFrEF (EF 40-45%, 2015) with ICM d/t HTN, asx orthostatic hypotension, HLD, uncontrolled DM2 on insulin with complications including neuropathy/foot ulceration /L toe amputations/osteomyelitis of L toe and foot, PVD, CKDIII, medication compliance d/t finances who is being seen today for the evaluation of HFrEF at the request of Dr. Anselm [name].  History of Present Illness:   [name] is a 57 yo with PMH as above  Presents with worsening shortness of breath over the past 2 months Treated with antibiotics 1 month ago by primary care with no improvement of symptoms Now with worsening orthopnea, PND, past several weeks  edema past several days Reports dietary, fluid noncompliance Long history of poorly controlled diabetes Hemoglobin A1c 12.9 in January 2019, was 13.26 December 2015 Most recent hemoglobin A1c 7.3  07/2014 TTE showed EF improved from 2014 EF of less than 20% to 30-35% 07/2014 and 40% on 03/2014 LHC. LHC showed 70% left main disease, 99% proximal RCA disease, 99% PL branch disease. Recent stress test as below in CV studies and Lexiscan as patient unable to exercise d/t uncontrolled DM2 with subsequent foot ulceration and MRSA infection leading to toe amputations.  02/2018 asymptomatic orthostasis with numbers 160 SBP supine  130/80 standing with no improvement after 3 minutes. Noted as hyperkalemic with K greater than 5 and reportedly received medication from Nephrology but unable to afford and not taking it. He reported he was not taking his diabetic medications after running out  of them with latest A1C 12.9. He was also documented as off his losartan.EKG at that time was NSR, 77bpm, old anterior and inferior MI with nonspecific TW abnormality. Euvolemic on exam and per patient, no recent weight changes or leg edema. Medical management at that time included holding his losartan given the hyperkalemia. Per documentation, a long discussion took place with patient regarding medication compliance and dietary / lifestyle changes with more aggressive DM2 control.   10/04/2018 Patient reported to [facility] ED with c/o SOB, left lower extremity swelling. He reported that he has not had issues with LEE with HFrEF dx until recently.  In the ED Vitals: BP 191/114, HR 84, RR 16, T 97.84F, 98% ORA Labs: glucose 15, Cr 2.11, Ca 8.5, Hgb 11.2,  Troponin 0.03 BNP 1648.0 EKG: NSR, 83bpm, no acute ST changes CXR: Findings noted as compatible with HF with mild pulmonary vascular congestion and interstitial edema. Trace b/l plueral effusions. Enlarged cardiac silhouette remains enlarged. Post CABG changes.  Meds: Nitro, IV lasix  Negative DVT per doppler  Prelim echocardiogram reviewed personally by myself showing moderately depressed ejection fraction 30 to 35%  Past Medical History:  Diagnosis Date  . CAD (coronary artery disease)    a. 04/2014 CABG x 4 (LIMA->LAD, VG->Diag, VG->OM, VG->PDA).  . Chronic systolic CHF (congestive heart failure) ([place])    a. 07/2014 Echo: EF 30-35%.  . CKD (chronic kidney disease), stage III ([place])    a. 12/2015 Creat 1.8.  . Diabetic foot ulcers ([place])    a. left foot 2nd digit ant 5 th digit 05/03/14; b. 08/2014 s/p amputation of toes on L foot.  . Hypercholesterolemia  a. 12/2015 TC 116, TG 154, HDL 24, LDL 61-->Atorvastatin 40.  Marland Kitchen Hypertensive heart disease   . Ischemic cardiomyopathy    a. 07/2013 Echo: EF 20%; b. 07/2014 Echo: EF 30-35%, diff HK, Gr1 DD, mildly dil LA, nl PASP.  Marland Kitchen Myocardial infarction ([place])   . Neuropathy in diabetes ([place])   . Open  wound    foot  . Orthostatic hypotension   . Peripheral vascular disease ([place])   . Type II diabetes mellitus ([place])    a. 12/2015 HbA1c = 13.9.    Past Surgical History:  Procedure Laterality Date  . ACHILLES TENDON SURGERY Right 01/22/2017   Procedure: ACHILLES LENGTHENING/KIDNER/TAL/Teno achilles lengthening;  Surgeon: Samara Deist, DPM;  Location: [facility] ORS;  Service: Podiatry;  Laterality: Right;  . CARDIAC CATHETERIZATION     03/2014  . CATARACT EXTRACTION    . CORONARY ARTERY BYPASS GRAFT N/A 05/07/2014   Procedure: CORONARY ARTERY BYPASS GRAFTING (CABG);  Surgeon: Gaye Pollack, MD;  Location: [place];  Service: Open Heart Surgery;  Laterality: N/A;  Times 4 using left internal mammary artery and endoscopically harvested right saphenous vein  . INTRAOPERATIVE TRANSESOPHAGEAL ECHOCARDIOGRAM N/A 05/07/2014   Procedure: INTRAOPERATIVE TRANSESOPHAGEAL ECHOCARDIOGRAM;  Surgeon: Gaye Pollack, MD;  Location: [facility];  Service: Open Heart Surgery;  Laterality: N/A;  . left foot osteomyelitis and wound after surgery    . TOE AMPUTATION     Left 3 and 4 toes  . TONSILECTOMY/ADENOIDECTOMY WITH MYRINGOTOMY       Home Medications:  Prior to Admission medications   Medication Sig Start Date End Date Taking? Authorizing Provider  aspirin EC 81 MG tablet Take 81 mg by mouth daily.   Yes [provider]  atorvastatin (LIPITOR) 40 MG tablet TAKE 1 TABLET EVERY DAY Patient taking differently: Take 40 mg by mouth at bedtime.  05/11/18  Yes , Kathlene November, MD  carvedilol (COREG) 12.5 MG tablet TAKE 1 TABLET TWO TIMES DAILY WITH A MEAL Patient taking differently: Take 12.5 mg by mouth 2 (two) times daily with a meal.  11/17/17  Yes , Kathlene November, MD  ferrous sulfate 325 (65 FE) MG tablet Take 325 mg by mouth daily.   Yes [provider]  fludrocortisone (FLORINEF) 0.1 MG tablet Take 0.1 mg by mouth at bedtime.    Yes [provider]  glimepiride (AMARYL) 4 MG tablet Take 4  mg by mouth 2 (two) times daily. 11/20/16  Yes [provider]  insulin aspart (NOVOLOG FLEXPEN) 100 UNIT/ML FlexPen Inject 0-16 Units into the skin 3 (three) times daily with meals. Sliding scale insulin   Yes [provider]  isosorbide mononitrate (IMDUR) 30 MG 24 hr tablet TAKE 1 TABLET EVERY DAY Patient taking differently: Take 30 mg by mouth daily.  11/17/17  Yes , Kathlene November, MD  losartan (COZAAR) 50 MG tablet Take 50 mg by mouth daily.   Yes [provider]  nitroGLYCERIN (NITROSTAT) 0.4 MG SL tablet Place 1 tablet (0.4 mg total) under the tongue every 5 (five) minutes as needed for chest pain. 12/24/15  Yes Theora Gianotti, NP  zolpidem (AMBIEN) 5 MG tablet Take 5 mg by mouth at bedtime as needed for sleep.   Yes [provider]    Inpatient Medications: Scheduled Meds: . aspirin EC  81 mg Oral Daily  . atorvastatin  40 mg Oral QHS  . carvedilol  12.5 mg Oral BID WC  . ferrous sulfate  325 mg Oral Daily  .  fludrocortisone  0.1 mg Oral QHS  . furosemide  20 mg Intravenous Q8H  . heparin  5,000 Units Subcutaneous Q8H  . insulin aspart  0-9 Units Subcutaneous TID WC  . isosorbide mononitrate  30 mg Oral Daily  . losartan  50 mg Oral Daily   Continuous Infusions:  PRN Meds: calcium carbonate, docusate sodium, labetalol, nitroGLYCERIN, zolpidem  Allergies:    Allergies  Allergen Reactions  . Other Other (See Comments)    Pt. And wife state that he had a reaction to "some" antibiotic but they do not know what the name of it was.They are are very apprehensive about him receiving any antibiotic. Zosyn, Clindamycin, & Vancomycin (all started together unsure which caused reaction 07/18/2013)    Social History:   Social History   Socioeconomic History  . Marital status: Married    Spouse name: Not on file  . Number of children: Not on file  . Years of education: Not on file  . Highest education level: Not on file  Occupational  History  . Not on file  Social Needs  . Financial resource strain: Not on file  . Food insecurity:    Worry: Not on file    Inability: Not on file  . Transportation needs:    Medical: Not on file    Non-medical: Not on file  Tobacco Use  . Smoking status: Never Smoker  . Smokeless tobacco: Never Used  Substance and Sexual Activity  . Alcohol use: No  . Drug use: No  . Sexual activity: Not on file  Lifestyle  . Physical activity:    Days per week: Not on file    Minutes per session: Not on file  . Stress: Not on file  Relationships  . Social connections:    Talks on phone: Not on file    Gets together: Not on file    Attends religious service: Not on file    Active member of club or organization: Not on file    Attends meetings of clubs or organizations: Not on file    Relationship status: Not on file  . Intimate partner violence:    Fear of current or ex partner: Not on file    Emotionally abused: Not on file    Physically abused: Not on file    Forced sexual activity: Not on file  Other Topics Concern  . Not on file  Social History Narrative  . Not on file    Family History:    Family History  Problem Relation Age of Onset  . Heart disease Father        CABG in his 59's  . Diabetes type II Unknown   . Kidney disease Unknown   . Hypertension Unknown      ROS:  Please see the history of present illness.  Review of Systems  Constitutional: Negative.   Respiratory: Positive for cough and shortness of breath.        Orthopnea, PND  Cardiovascular: Negative.   Gastrointestinal: Negative.   Musculoskeletal: Negative.   Neurological: Negative.   Psychiatric/Behavioral: Negative.   All other systems reviewed and are negative.  Physical Exam/Data:   Vitals:   10/04/18 2319 10/05/18 0328 10/05/18 0330 10/05/18 0802  BP: (!) 184/96 (!) 184/98 (!) 157/86 (!) 183/95  Pulse: 81 82 81 84  Resp:  20  19  Temp:  98.4 F (36.9 C)  98.3 F (36.8 C)  TempSrc:   Oral  Oral  SpO2:  96%  95% 94%  Weight:   120.7 kg   Height:        Intake/Output Summary (Last 24 hours) at 10/05/2018 1032 Last data filed at 10/05/2018 0700 Gross per 24 hour  Intake 0 ml  Output 5450 ml  Net -5450 ml   Filed Weights   10/04/18 1955 10/05/18 0330  Weight: 124 kg 120.7 kg   Body mass index is 35.12 kg/m.  General:  Well nourished, well developed, in no acute distress, morbidly obese HEENT: normal Lymph: no adenopathy Neck: no JVD Endocrine:  No thryomegaly Vascular: No carotid bruits; FA pulses 2+ bilaterally without bruits  Cardiac:  normal S1, S2; RRR; no murmur Lungs: Dullness at the bases bilaterally Abd: soft, nontender, no hepatomegaly  Ext: no edema Musculoskeletal:  No deformities, BUE and BLE strength normal and equal Skin: warm and dry  Neuro:  CNs 2-12 intact, no focal abnormalities noted Psych:  Normal affect   EKG: Normal sinus rhythm with rate 83 bpm consider old anterior MI, left posterior fascicular block  Telemetry:  Telemetry was personally reviewed and demonstrates:  Normal sinus rhythm  CV Studies:   Relevant CV Studies: Echocardiogram performed today showing ejection fraction 30 to 35%, global hypokinesis Unable to exclude regional wall motion abnormalities  Laboratory Data:  Chemistry Recent Labs  Lab 10/04/18 1447 10/05/18 0427  NA 142 145  K 4.9 4.0  CL 113* 112*  CO2 25 27  GLUCOSE 150* 87  BUN 32* 32*  CREATININE 2.11* 2.20*  CALCIUM 8.5* 8.8*  GFRNONAA 34* 32*  GFRAA 39* 37*  ANIONGAP 4* 6    No results for input(s): PROT, ALBUMIN, AST, ALT, ALKPHOS, BILITOT in the last 168 hours. Hematology Recent Labs  Lab 10/04/18 1447 10/05/18 0427  WBC 7.2 6.7  RBC 3.66* 3.71*  HGB 11.2* 11.2*  HCT 35.2* 35.3*  MCV 96.2 95.1  MCH 30.6 30.2  MCHC 31.8 31.7  RDW 14.2 14.1  PLT 204 202   Cardiac Enzymes Recent Labs  Lab 10/04/18 1447 10/04/18 1722  TROPONINI 0.03* 0.03*   No results for input(s):  TROPIPOC in the last 168 hours.  BNP Recent Labs  Lab 10/04/18 1454  BNP 1,648.0*    DDimer No results for input(s): DDIMER in the last 168 hours.  Radiology/Studies:  Dg Chest 2 View  Result Date: 10/05/2018 CLINICAL DATA:  CHF. History of coronary artery disease, previous MI, and CABG. EXAM: CHEST - 2 VIEW COMPARISON:  PA and lateral chest x-ray of October 04, 2018 FINDINGS: The lungs are adequately inflated. The heart is top-normal in size. The pulmonary vascularity is less conspicuous today. There is no pleural effusion or alveolar infiltrate. There are post CABG changes. IMPRESSION: Interval decrease in the conspicuity of the pulmonary vascularity is consistent with improving CHF. Mild central pulmonary vascular prominence persists. Electronically Signed   By: David  Martinique M.D.   On: 10/05/2018 07:23   Dg Chest 2 View  Result Date: 10/04/2018 CLINICAL DATA:  Shortness of breath and left lower extremity swelling. History of CHF, previous MI. Current symptoms began recently. Nonsmoker. History of diabetes and hypertension. EXAM: CHEST - 2 VIEW COMPARISON:  Chest x-ray of August 25, 2014 FINDINGS: The lungs are adequately inflated. There is mild chronic elevation of the right hemidiaphragm. The interstitial markings are increased. The cardiac silhouette remains enlarged. The pulmonary vascularity is engorged and more prominent than on the previous study. There are post CABG changes. There is a trace of pleural fluid blunting the posterior  costophrenic angles. IMPRESSION: Findings compatible with CHF with mild pulmonary vascular congestion and interstitial edema. Trace bilateral pleural effusions. Electronically Signed   By: David  Martinique M.D.   On: 10/04/2018 15:10   US Venous Img Lower Bilateral  Result Date: 10/04/2018 CLINICAL DATA:  Lower extremity swelling since earlier today. EXAM: BILATERAL LOWER EXTREMITY VENOUS DOPPLER ULTRASOUND TECHNIQUE: Gray-scale sonography with graded  compression, as well as color Doppler and duplex ultrasound were performed to evaluate the lower extremity deep venous systems from the level of the common femoral vein and including the common femoral, femoral, profunda femoral, popliteal and calf veins including the posterior tibial, peroneal and gastrocnemius veins when visible. The superficial great saphenous vein was also interrogated. Spectral Doppler was utilized to evaluate flow at rest and with distal augmentation maneuvers in the common femoral, femoral and popliteal veins. COMPARISON:  Left lower extremity venous Doppler scan dated 07/20/2013 FINDINGS: RIGHT LOWER EXTREMITY Common Femoral Vein: No evidence of thrombus. Normal compressibility, respiratory phasicity and response to augmentation. Saphenofemoral Junction: No evidence of thrombus. Normal compressibility and flow on color Doppler imaging. Profunda Femoral Vein: No evidence of thrombus. Normal compressibility and flow on color Doppler imaging. Femoral Vein: No evidence of thrombus. Normal compressibility, respiratory phasicity and response to augmentation. Popliteal Vein: No evidence of thrombus. Normal compressibility, respiratory phasicity and response to augmentation. Calf Veins: No evidence of thrombus. Normal compressibility and flow on color Doppler imaging. Superficial Great Saphenous Vein: No evidence of thrombus. Normal compressibility. Venous Reflux:  None. Other Findings:  None. LEFT LOWER EXTREMITY Common Femoral Vein: No evidence of thrombus. Normal compressibility, respiratory phasicity and response to augmentation. Saphenofemoral Junction: No evidence of thrombus. Normal compressibility and flow on color Doppler imaging. Profunda Femoral Vein: No evidence of thrombus. Normal compressibility and flow on color Doppler imaging. Femoral Vein: No evidence of thrombus. Normal compressibility, respiratory phasicity and response to augmentation. Popliteal Vein: No evidence of thrombus.  Normal compressibility, respiratory phasicity and response to augmentation. Calf Veins: No evidence of thrombus. Normal compressibility and flow on color Doppler imaging. Superficial Great Saphenous Vein: No evidence of thrombus. Normal compressibility. Venous Reflux:  None. Other Findings:  None. IMPRESSION: No evidence of deep venous thrombosis in either lower extremity. Electronically Signed   By: Ilona Sorrel M.D.   On: 10/04/2018 18:57    Assessment and Plan:   Acute on chronic diastolic and systolic CHF In the setting of poorly controlled hypertension, worsening renal failure, diet noncompliance, worsening LV function Large left pleural effusion 8 cm on exam -Symptoms improving on IV Lasix, close monitoring of renal function given creatinine well above baseline -Additional medications added as detailed below for blood pressure control  Uncontrolled DM2 on insulin with complication Long history of poorly controlled diabetes Wife not at the bedside on my exam Poor diet, medication compliance unclear Discussed the ramifications of poorly controlled diabetes and in particular effect on the kidneys  Malignant hypertension Unable to add ACE inhibitor, ARB Was previously taken off ARB secondary to hyperkalemia Reports ARB was restarted by nephrology We will start hydralazine, increase isosorbide  Ischemic cardiomyopathy Prelim estimate ejection fraction 30 to 35% Echocardiogram reviewed personally by myself Prior echocardiogram July 2018 ejection fraction 40 to 45%  CAD s/p CABG x4 Given low ejection fraction may benefit from stress testing once CHF symptoms improved  Acute on chronic kidney disease Managed by Dr. Candiss Norse Poorly controlled diabetes, Suspect there is a component of cardiorenal syndrome Would follow renal function closely with diuresis  Pleural  effusion  Large, secondary to CHF Should improve with diuresis, Though may need to consider thoracentesis if no  improvement  Long discussion with patient concerning above, Discussed with hospitalist service Complex patient with numerous issues to manage including hypertension, diabetes, cardiomyopathy, ischemia  Total encounter time more than 110 minutes  Greater than 50% was spent in counseling and coordination of care with the patient   For questions or updates, please contact [facility] Please consult [email or web address] for contact info under   Signed, Esmond Plants, MD, Ph.D [facility]

## 2018-10-05 NOTE — Plan of Care (Signed)
  Problem: Clinical Measurements: Goal: Ability to maintain clinical measurements within normal limits will improve Outcome: Progressing   Problem: Activity: Goal: Risk for activity intolerance will decrease Outcome: Progressing   Problem: Safety: Goal: Ability to remain free from injury will improve Outcome: Progressing   Problem: Skin Integrity: Goal: Risk for impaired skin integrity will decrease Outcome: Progressing

## 2018-10-05 NOTE — Progress Notes (Signed)
Patient is transferred from the ER following c/o SOB. On admission to the unit, patient was A&O X4, denied being in pain and ambulatory. Patient was oriented to the room, admission documentation completed and care plan was reviewed with the patient. PRN labetalol administered for SBP>180.

## 2018-10-05 NOTE — Progress Notes (Signed)
Patient's bp continues to be elevated at 178/96, HR 86 after receiving morning blood pressure meds and IV Labetalol, Dr. Estanislado Pandy notified, no new orders received, Will continue to monitor.

## 2018-10-05 NOTE — Progress Notes (Signed)
[place] [facility] [place] NAME: [name] [name]    MR#:  [id number]  DATE OF BIRTH:  07/31/1961  SUBJECTIVE:  CHIEF COMPLAINT:   Chief Complaint  Patient presents with  . Leg Swelling  . Shortness of Breath  Seen and evaluated today Has increased shortness of breath Swelling in the lower extremities No complaints of chest pain  REVIEW OF SYSTEMS:    ROS  CONSTITUTIONAL: No documented fever. No fatigue, weakness. No weight gain, no weight loss.  EYES: No blurry or double vision.  ENT: No tinnitus. No postnasal drip. No redness of the oropharynx.  RESPIRATORY: No cough, no wheeze, no hemoptysis. has dyspnea.  CARDIOVASCULAR: No chest pain. No orthopnea. No palpitations. No syncope.  GASTROINTESTINAL: No nausea, no vomiting or diarrhea. No abdominal pain. No melena or hematochezia.  GENITOURINARY: No dysuria or hematuria.  ENDOCRINE: No polyuria or nocturia. No heat or cold intolerance.  HEMATOLOGY: No anemia. No bruising. No bleeding.  INTEGUMENTARY: No rashes. No lesions.  MUSCULOSKELETAL: No arthritis. has swelling. No gout.  NEUROLOGIC: No numbness, tingling, or ataxia. No seizure-type activity.  PSYCHIATRIC: No anxiety. No insomnia. No ADD.   DRUG ALLERGIES:   Allergies  Allergen Reactions  . Other Other (See Comments)    Pt. And wife state that he had a reaction to "some" antibiotic but they do not know what the name of it was.They are are very apprehensive about him receiving any antibiotic. Zosyn, Clindamycin, & Vancomycin (all started together unsure which caused reaction 07/18/2013)    VITALS:  Blood pressure (!) 163/84, pulse 83, temperature 98.3 F (36.8 C), temperature source Oral, resp. rate 18, height 6\' 1"  (1.854 m), weight 120.7 kg, SpO2 93 %.  PHYSICAL EXAMINATION:   Physical Exam  GENERAL:  57 y.o.-year-old patient lying in the bed with no acute distress.  EYES: Pupils equal, round, reactive to light and  accommodation. No scleral icterus. Extraocular muscles intact.  HEENT: Head atraumatic, normocephalic. Oropharynx and nasopharynx clear.  NECK:  Supple, no jugular venous distention. No thyroid enlargement, no tenderness.  LUNGS: Decreased breath sounds bilaterally, bibasilar crepitations. No use of accessory muscles of respiration.  CARDIOVASCULAR: S1, S2 normal. No murmurs, rubs, or gallops.  ABDOMEN: Soft, nontender, nondistended. Bowel sounds present. No organomegaly or mass.  EXTREMITIES: No cyanosis, clubbing  Has edema b/l.    NEUROLOGIC: Cranial nerves II through XII are intact. No focal Motor or sensory deficits b/l.   PSYCHIATRIC: The patient is alert and oriented x 3.  SKIN: No obvious rash, lesion, or ulcer.   LABORATORY PANEL:   CBC Recent Labs  Lab 10/05/18 0427  WBC 6.7  HGB 11.2*  HCT 35.3*  PLT 202   ------------------------------------------------------------------------------------------------------------------ Chemistries  Recent Labs  Lab 10/05/18 0427  NA 145  K 4.0  CL 112*  CO2 27  GLUCOSE 87  BUN 32*  CREATININE 2.20*  CALCIUM 8.8*   ------------------------------------------------------------------------------------------------------------------  Cardiac Enzymes Recent Labs  Lab 10/04/18 1722  TROPONINI 0.03*   ------------------------------------------------------------------------------------------------------------------  RADIOLOGY:  Dg Chest 2 View  Result Date: 10/05/2018 CLINICAL DATA:  CHF. History of coronary artery disease, previous MI, and CABG. EXAM: CHEST - 2 VIEW COMPARISON:  PA and lateral chest x-ray of October 04, 2018 FINDINGS: The lungs are adequately inflated. The heart is top-normal in size. The pulmonary vascularity is less conspicuous today. There is no pleural effusion or alveolar infiltrate. There are post CABG changes. IMPRESSION: Interval decrease in the conspicuity of the pulmonary  vascularity is consistent with  improving CHF. Mild central pulmonary vascular prominence persists. Electronically Signed   By: David  Martinique M.D.   On: 10/05/2018 07:23   Dg Chest 2 View  Result Date: 10/04/2018 CLINICAL DATA:  Shortness of breath and left lower extremity swelling. History of CHF, previous MI. Current symptoms began recently. Nonsmoker. History of diabetes and hypertension. EXAM: CHEST - 2 VIEW COMPARISON:  Chest x-ray of August 25, 2014 FINDINGS: The lungs are adequately inflated. There is mild chronic elevation of the right hemidiaphragm. The interstitial markings are increased. The cardiac silhouette remains enlarged. The pulmonary vascularity is engorged and more prominent than on the previous study. There are post CABG changes. There is a trace of pleural fluid blunting the posterior costophrenic angles. IMPRESSION: Findings compatible with CHF with mild pulmonary vascular congestion and interstitial edema. Trace bilateral pleural effusions. Electronically Signed   By: David  Martinique M.D.   On: 10/04/2018 15:10   US Venous Img Lower Bilateral  Result Date: 10/04/2018 CLINICAL DATA:  Lower extremity swelling since earlier today. EXAM: BILATERAL LOWER EXTREMITY VENOUS DOPPLER ULTRASOUND TECHNIQUE: Gray-scale sonography with graded compression, as well as color Doppler and duplex ultrasound were performed to evaluate the lower extremity deep venous systems from the level of the common femoral vein and including the common femoral, femoral, profunda femoral, popliteal and calf veins including the posterior tibial, peroneal and gastrocnemius veins when visible. The superficial great saphenous vein was also interrogated. Spectral Doppler was utilized to evaluate flow at rest and with distal augmentation maneuvers in the common femoral, femoral and popliteal veins. COMPARISON:  Left lower extremity venous Doppler scan dated 07/20/2013 FINDINGS: RIGHT LOWER EXTREMITY Common Femoral Vein: No evidence of thrombus. Normal  compressibility, respiratory phasicity and response to augmentation. Saphenofemoral Junction: No evidence of thrombus. Normal compressibility and flow on color Doppler imaging. Profunda Femoral Vein: No evidence of thrombus. Normal compressibility and flow on color Doppler imaging. Femoral Vein: No evidence of thrombus. Normal compressibility, respiratory phasicity and response to augmentation. Popliteal Vein: No evidence of thrombus. Normal compressibility, respiratory phasicity and response to augmentation. Calf Veins: No evidence of thrombus. Normal compressibility and flow on color Doppler imaging. Superficial Great Saphenous Vein: No evidence of thrombus. Normal compressibility. Venous Reflux:  None. Other Findings:  None. LEFT LOWER EXTREMITY Common Femoral Vein: No evidence of thrombus. Normal compressibility, respiratory phasicity and response to augmentation. Saphenofemoral Junction: No evidence of thrombus. Normal compressibility and flow on color Doppler imaging. Profunda Femoral Vein: No evidence of thrombus. Normal compressibility and flow on color Doppler imaging. Femoral Vein: No evidence of thrombus. Normal compressibility, respiratory phasicity and response to augmentation. Popliteal Vein: No evidence of thrombus. Normal compressibility, respiratory phasicity and response to augmentation. Calf Veins: No evidence of thrombus. Normal compressibility and flow on color Doppler imaging. Superficial Great Saphenous Vein: No evidence of thrombus. Normal compressibility. Venous Reflux:  None. Other Findings:  None. IMPRESSION: No evidence of deep venous thrombosis in either lower extremity. Electronically Signed   By: Ilona Sorrel M.D.   On: 10/04/2018 18:57     ASSESSMENT AND PLAN:  57 year old male patient with history of coronary disease, CABG, chronic systolic heart failure with EF of 35%, chronic kidney disease stage III, hyperlipidemia, cardiomyopathy, diabetic neuropathy, peripheral vascular  disease currently under hospitalist service for fluid overload  -Acute on chronic systolic heart failure exacerbation Has history of ischemic cardiomyopathy Diuresis with IV Lasix Cardiology consultation Follow-up echocardiogram Input output chart and daily body weights  -Lower extremity  edema secondary to decompensated heart failure Doppler ultrasound of lower extremity negative for DVT  -Hypertension uncontrolled Continue beta-blocker And oral hydralazine for better control of blood pressure Discussed with cardiology  -Chronic kidney disease stage III Monitor renal function   All the records are reviewed and case discussed with Care Management/Social Worker. Management plans discussed with the patient, family and they are in agreement.  CODE STATUS: Full code  DVT Prophylaxis: SCDs  TOTAL TIME TAKING CARE OF THIS PATIENT: 32 minutes.   POSSIBLE D/C IN 1 to 2 DAYS, DEPENDING ON CLINICAL CONDITION.  Saundra Shelling M.D on 10/05/2018 at 1:34 PM  Between 7am to 6pm - Pager - [phone]  After 6pm go to [email or web address] - password [email or web address] [facility]  Office  [phone]  CC: Primary care physician; Tracie Harrier, MD  Note: This dictation was prepared with Dragon dictation along with smaller phrase technology. Any transcriptional errors that result from this process are unintentional.

## 2018-10-05 NOTE — Progress Notes (Signed)
Per CCMD pt had 40 run of v-tach, pt asymptomatic, VSS. No complains of CP. Dr. Rockey Situ notified

## 2018-10-05 NOTE — Progress Notes (Signed)
*  PRELIMINARY RESULTS* Echocardiogram 2D Echocardiogram has been performed.  [name] [name] 10/05/2018, 2:00 PM

## 2018-10-05 NOTE — Progress Notes (Signed)
Cardiovascular and Pulmonary Nurse Navigator Notes:  57 year old male with hx of CAD, CABG,  Chronic Systolic Heart Failure with EF of 35%, chronic kidney disease - stage III, HLD, Cardiomyopathy, PVD who presented to the ED with c/o leg swelling and SOB.  Patient admitted with dx of acute on chronic combined systolic CHF.   ------------------------------------------------------------------- Transthoracic Echocardiography 10/05/2018  Patient:    [name] [name], [name] [name] MR #:       [id number] Study Date: 10/05/2018 Gender:     M Age:        14 Height:     185.4 cm Weight:     120.7 kg BSA:        2.53 m^2 Pt. Status: Room:   ATTENDING    Vaughan Basta Md  ORDERING     Vaughan Basta Md  REFERRING    Vaughan Basta Md  SONOGRAPHER  Sonia Side Hege RDCS  PERFORMING   [facility]  cc:  ------------------------------------------------------------------- LV EF: 30% -   35%  ------------------------------------------------------------------- Indications:      CHF- acute systolic 956.21.  ------------------------------------------------------------------- Study Conclusions  - Left ventricle: The cavity size was moderately dilated. Systolic   function was moderately to severely reduced. The estimated   ejection fraction was in the range of 30% to 35%. Diffuse   hypokinesis. Regional wall motion abnormalities cannot be   excluded. Doppler parameters are consistent with abnormal left   ventricular relaxation (grade 1 diastolic dysfunction). - Mitral valve: There was mild regurgitation. - Left atrium: The atrium was mildly dilated. - Right ventricle: Poorly visualized. Systolic function was normal. - Pulmonary arteries: Systolic pressure could not be accurately   estimated.  Impressions:  - Left pleural effusion noted, 8 cm  _______________________________________________ CHF Education:?? Educational session with patient completed.  Provided patient with  "Living Better with Heart Failure" packet. Briefly reviewed definition of heart failure and signs and symptoms of an exacerbation.?Explained to patient that HF is a chronic illness which requires self-assessment / self-management along with help from the cardiologist/PCP.?? ? *Reviewed importance of and reason behind checking weight daily in the AM, after using the bathroom, but before getting dressed. Patient has scales. Patient stated he does not weigh everyday.  This RN encouraged patient to start weighing himself as well as monitor his fluid status and symptoms daily.   ? *Reviewed with patient the following information: *Discussed when to call the Dr= weight gain of >2-3lb overnight of 5lb in a week,  *Discussed yellow zone= call MD: weight gain of >2-3lb overnight of 5lb in a week, increased swelling, increased SOB when lying down, chest discomfort, dizziness, increased fatigue *Red Zone= call 911: struggle to breath, fainting or near fainting, significant chest pain   Patient reported being seen by Dr. Ginette Pitman on 09/12/2018 and was diagnosed with Acute URI and treaetd with antibiotics for 10 days with no improvement per patient.   Patient reported trying to get in to see Dr. Rockey Situ on 09/30/2018, but there were not appointments available.   *Reviewed low sodium diet-provided handout of recommended and not recommended foods.?  ? *Discussed fluid intake with patient as well. Patient not currently on a fluid restriction, but advised no more than 64 ounces of fluids per day.? ? *Instructed patient to take medications as prescribed for heart failure. Explained briefly why pt is on the medications (either make you feel better, live longer or keep you out of the hospital) and discussed monitoring and side effects. Patient reported he was not on  Lasix previously, but feels as though he needs some low dose Lasix to take at home.    *Discussed exercise.  Benefits of exercise discussed.  Explained to  patient per Medicare guidelines with dx of CHF and with his EF of 30-35% he is a candidate for Cardiac Rehab.  Patient reported to this RN that he tried Cardiac Rehab in the past, but was unable to finish the program due to his foot problems and needing to have part of his foot amputated.  Patient also reported having a problem with is sciatic nerve. Discussed walking.  Patient is able to walk but at times his balance is affected due to his foot. Patient is not sure he will be able to participate in Cardiac Rehab due to  his sciatic nerve problem.  Patient further explained that Dr. Rockey Situ has mentioned going to pain clinic for sciatic problem.  Patiet is considering this.  Encouraged patient to be as active as possible.   *Smoking Cessation- Patient is a NEVER smoker.? ? *[facility] - Explained the purpose of the HF Clinic. ?Explained to patient the HF Clinic does not replace PCP nor Cardiologist, but is an additional resource to helping patient manage heart failure at home. Patient agreeable to being followed in the [facility].   Patient's new patient appointment in the [facility] is scheduled for 10/12/2018 at 9:20 a.m.   Again, the 5 Steps to Living Better with Heart Failure were reviewed with patient.  ? Patient thanked me for providing the above information. ? ? Roanna Epley, RN, BSN, [facility]  Cardiovascular &?Pulmonary Nurse Navigator  Direct Line: [phone]  Department Phone #: [phone] Fax: [phone]? Email Address: [email or web address]

## 2018-10-05 NOTE — Progress Notes (Signed)
Pt c/o of heart burn,  Dr. Estanislado Pandy notified, received verbal orders for tums.

## 2018-10-06 ENCOUNTER — Encounter: Payer: Self-pay | Admitting: Physician Assistant

## 2018-10-06 ENCOUNTER — Encounter: Payer: Self-pay | Admitting: *Deleted

## 2018-10-06 LAB — GLUCOSE, CAPILLARY
GLUCOSE-CAPILLARY: 104 mg/dL — AB (ref 70–99)
GLUCOSE-CAPILLARY: 156 mg/dL — AB (ref 70–99)
Glucose-Capillary: 128 mg/dL — ABNORMAL HIGH (ref 70–99)
Glucose-Capillary: 115 mg/dL — ABNORMAL HIGH (ref 70–99)

## 2018-10-06 LAB — BASIC METABOLIC PANEL
ANION GAP: 7 (ref 5–15)
BUN: 36 mg/dL — ABNORMAL HIGH (ref 6–20)
CALCIUM: 8.7 mg/dL — AB (ref 8.9–10.3)
CHLORIDE: 106 mmol/L (ref 98–111)
CO2: 29 mmol/L (ref 22–32)
Creatinine, Ser: 2.3 mg/dL — ABNORMAL HIGH (ref 0.61–1.24)
GFR calc non Af Amer: 30 mL/min — ABNORMAL LOW (ref >60)
GFR, EST AFRICAN AMERICAN: 35 mL/min — AB (ref >60)
Glucose, Bld: 82 mg/dL (ref 70–99)
Potassium: 3.8 mmol/L (ref 3.5–5.1)
SODIUM: 142 mmol/L (ref 135–145)

## 2018-10-06 LAB — HIV ANTIBODY (ROUTINE TESTING W REFLEX): HIV Screen 4th Generation wRfx: NONREACTIVE

## 2018-10-06 MED ORDER — HYDRALAZINE HCL 50 MG PO TABS
50.0000 mg | ORAL_TABLET | Freq: Two times a day (BID) | ORAL | Status: DC
  Administered 2018-10-06 – 2018-10-07 (×2): 50 mg via ORAL
  Filled 2018-10-06 (×2): qty 1

## 2018-10-06 MED ORDER — ISOSORBIDE MONONITRATE ER 60 MG PO TB24
60.0000 mg | ORAL_TABLET | Freq: Every day | ORAL | Status: DC
  Filled 2018-10-06: qty 1

## 2018-10-06 MED ORDER — LABETALOL HCL 5 MG/ML IV SOLN: 10.0000 mg | INTRAVENOUS | Status: DC | PRN

## 2018-10-06 MED ORDER — ISOSORBIDE MONONITRATE ER 30 MG PO TB24
30.0000 mg | ORAL_TABLET | Freq: Once | ORAL | Status: AC
  Administered 2018-10-06: 30 mg via ORAL
  Filled 2018-10-06: qty 1

## 2018-10-06 MED ORDER — CARVEDILOL 12.5 MG PO TABS
12.5000 mg | ORAL_TABLET | Freq: Two times a day (BID) | ORAL | Status: DC
  Administered 2018-10-07: 12.5 mg via ORAL
  Filled 2018-10-06: qty 1

## 2018-10-06 MED ORDER — FUROSEMIDE 10 MG/ML IJ SOLN
20.0000 mg | Freq: Every day | INTRAMUSCULAR | Status: DC
  Administered 2018-10-07: 20 mg via INTRAVENOUS
  Filled 2018-10-06: qty 2

## 2018-10-06 NOTE — Progress Notes (Signed)
Progress Note  Patient Name: [name] [name] Date of Encounter: 10/06/2018  Primary Cardiologist: Ida Rogue, MD , [facility]  Subjective   Feels well, no complaints No significant shortness of breath " Best I have felt in a while" Denies any significant chest pain or shortness of breath Echocardiogram results discussed with him showing ejection fraction 30 to 35%  Blood pressure continues to run high  Inpatient Medications    Scheduled Meds: . aspirin EC  81 mg Oral Daily  . atorvastatin  40 mg Oral QHS  . [START ON 10/07/2018] carvedilol  12.5 mg Oral BID WC  . ferrous sulfate  325 mg Oral Daily  . furosemide  20 mg Intravenous Q8H  . heparin  5,000 Units Subcutaneous Q8H  . hydrALAZINE  50 mg Oral BID  . insulin aspart  0-9 Units Subcutaneous TID WC  . isosorbide mononitrate  30 mg Oral Once  . [START ON 10/07/2018] isosorbide mononitrate  60 mg Oral Daily  . losartan  50 mg Oral Daily   Continuous Infusions:  PRN Meds: calcium carbonate, docusate sodium, [START ON 10/07/2018] labetalol, nitroGLYCERIN, zolpidem   Vital Signs    Vitals:   10/06/18 0500 10/06/18 0537 10/06/18 0747 10/06/18 0855  BP:  (!) 166/90 (!) 160/81   Pulse:  80 78   Resp:  18    Temp:  98.2 F (36.8 C) 98 F (36.7 C)   TempSrc:   Oral   SpO2:  94% 91%   Weight: 115.2 kg   117.8 kg  Height:        Intake/Output Summary (Last 24 hours) at 10/06/2018 1531 Last data filed at 10/06/2018 1432 Gross per 24 hour  Intake 240 ml  Output 4230 ml  Net -3990 ml   Filed Weights   10/05/18 0330 10/06/18 0500 10/06/18 0855  Weight: 120.7 kg 115.2 kg 117.8 kg    Telemetry    nsr- Personally Reviewed  ECG     - Personally Reviewed  Physical Exam   Constitutional:  oriented to person, place, and time. No distress.  Obese HENT:  Head: Grossly normal Eyes:  no discharge. No scleral icterus.  Neck: No JVD, no carotid bruits  Cardiovascular: Regular rate and rhythm, no  murmurs appreciated Pulmonary/Chest: Clear to auscultation bilaterally, no wheezes or rails Abdominal: Soft.  no distension.  no tenderness.  Musculoskeletal: Normal range of motion Neurological:  normal muscle tone. Coordination normal. No atrophy Skin: Skin warm and dry Psychiatric: normal affect, pleasant   Labs    Chemistry Recent Labs  Lab 10/04/18 1447 10/05/18 0427 10/06/18 0510  NA 142 145 142  K 4.9 4.0 3.8  CL 113* 112* 106  CO2 25 27 29   GLUCOSE 150* 87 82  BUN 32* 32* 36*  CREATININE 2.11* 2.20* 2.30*  CALCIUM 8.5* 8.8* 8.7*  GFRNONAA 34* 32* 30*  GFRAA 39* 37* 35*  ANIONGAP 4* 6 7     Hematology Recent Labs  Lab 10/04/18 1447 10/05/18 0427  WBC 7.2 6.7  RBC 3.66* 3.71*  HGB 11.2* 11.2*  HCT 35.2* 35.3*  MCV 96.2 95.1  MCH 30.6 30.2  MCHC 31.8 31.7  RDW 14.2 14.1  PLT 204 202    Cardiac Enzymes Recent Labs  Lab 10/04/18 1447 10/04/18 1722  TROPONINI 0.03* 0.03*   No results for input(s): TROPIPOC in the last 168 hours.   BNP Recent Labs  Lab 10/04/18 1454  BNP 1,648.0*     DDimer No results for  input(s): DDIMER in the last 168 hours.   Radiology    Dg Chest 2 View  Result Date: 10/05/2018 CLINICAL DATA:  CHF. History of coronary artery disease, previous MI, and CABG. EXAM: CHEST - 2 VIEW COMPARISON:  PA and lateral chest x-ray of October 04, 2018 FINDINGS: The lungs are adequately inflated. The heart is top-normal in size. The pulmonary vascularity is less conspicuous today. There is no pleural effusion or alveolar infiltrate. There are post CABG changes. IMPRESSION: Interval decrease in the conspicuity of the pulmonary vascularity is consistent with improving CHF. Mild central pulmonary vascular prominence persists. Electronically Signed   By: David  Martinique M.D.   On: 10/05/2018 07:23   US Venous Img Lower Bilateral  Result Date: 10/04/2018 CLINICAL DATA:  Lower extremity swelling since earlier today. EXAM: BILATERAL LOWER  EXTREMITY VENOUS DOPPLER ULTRASOUND TECHNIQUE: Gray-scale sonography with graded compression, as well as color Doppler and duplex ultrasound were performed to evaluate the lower extremity deep venous systems from the level of the common femoral vein and including the common femoral, femoral, profunda femoral, popliteal and calf veins including the posterior tibial, peroneal and gastrocnemius veins when visible. The superficial great saphenous vein was also interrogated. Spectral Doppler was utilized to evaluate flow at rest and with distal augmentation maneuvers in the common femoral, femoral and popliteal veins. COMPARISON:  Left lower extremity venous Doppler scan dated 07/20/2013 FINDINGS: RIGHT LOWER EXTREMITY Common Femoral Vein: No evidence of thrombus. Normal compressibility, respiratory phasicity and response to augmentation. Saphenofemoral Junction: No evidence of thrombus. Normal compressibility and flow on color Doppler imaging. Profunda Femoral Vein: No evidence of thrombus. Normal compressibility and flow on color Doppler imaging. Femoral Vein: No evidence of thrombus. Normal compressibility, respiratory phasicity and response to augmentation. Popliteal Vein: No evidence of thrombus. Normal compressibility, respiratory phasicity and response to augmentation. Calf Veins: No evidence of thrombus. Normal compressibility and flow on color Doppler imaging. Superficial Great Saphenous Vein: No evidence of thrombus. Normal compressibility. Venous Reflux:  None. Other Findings:  None. LEFT LOWER EXTREMITY Common Femoral Vein: No evidence of thrombus. Normal compressibility, respiratory phasicity and response to augmentation. Saphenofemoral Junction: No evidence of thrombus. Normal compressibility and flow on color Doppler imaging. Profunda Femoral Vein: No evidence of thrombus. Normal compressibility and flow on color Doppler imaging. Femoral Vein: No evidence of thrombus. Normal compressibility, respiratory  phasicity and response to augmentation. Popliteal Vein: No evidence of thrombus. Normal compressibility, respiratory phasicity and response to augmentation. Calf Veins: No evidence of thrombus. Normal compressibility and flow on color Doppler imaging. Superficial Great Saphenous Vein: No evidence of thrombus. Normal compressibility. Venous Reflux:  None. Other Findings:  None. IMPRESSION: No evidence of deep venous thrombosis in either lower extremity. Electronically Signed   By: Ilona Sorrel M.D.   On: 10/04/2018 18:57    Cardiac Studies   Left ventricle: The cavity size was moderately dilated. Systolic   function was moderately to severely reduced. The estimated   ejection fraction was in the range of 30% to 35%. Diffuse   hypokinesis. Regional wall motion abnormalities cannot be   excluded. Doppler parameters are consistent with abnormal left   ventricular relaxation (grade 1 diastolic dysfunction). - Mitral valve: There was mild regurgitation. - Left atrium: The atrium was mildly dilated. - Right ventricle: Poorly visualized. Systolic function was normal. - Pulmonary arteries: Systolic pressure could not be accurately   estimated.  Impressions:  - Left pleural effusion noted, 8 cm  Patient Profile     Arnette Norris  R Delange is a 57 y.o. male with a hx of CAD s/p CABG x4 04/2014, HFrEF (EF 40-45%, 2015) with ICM d/t HTN, asx orthostatic hypotension, HLD, uncontrolled DM2 on insulin with complications including neuropathy/foot ulceration /L toe amputations/osteomyelitis of L toe and foot, PVD, CKDIII, medication compliance d/t finances who is being seen today for the evaluation of HFrEF   Assessment & Plan   Acute on chronic diastolic and systolic CHF  poorly controlled hypertension, worsening renal failure, diet noncompliance, worsening LV function Large left pleural effusion 8 cm on exam Ejection fraction 30 to 35% --- We will continue Lasix 20 IV, decreased down to daily given renal  dysfunction and he feels well  Uncontrolled DM2 on insulin with complication Long history of poorly controlled diabetes Poor diet, medication compliance unclear Hemoglobin A1c has improved this year through dietary changes, medication compliance  Malignant hypertension On low-dose ARB started by nephrology as outpatient Increase hydralazine 50 3 times daily, increase isosorbide 60 daily  Ischemic cardiomyopathy Prelim estimate ejection fraction 30 to 35% Prior echocardiogram July 2018 ejection fraction 40 to 45% Recommend stress test tomorrow given concern for ischemia, drop in ejection fraction  CAD s/p CABG x4 Drop in ejection fraction, stress test ordered for tomorrow, able to treadmill  Acute on chronic kidney disease Managed by Dr. Candiss Norse Would likely benefit from Lasix 20 or 40 oral dosing daily as outpatient  Pleural effusion  Large, secondary to CHF Should improve with diuresis, Continue to follow-up as an outpatient, may need thoracentesis if it persists   Total encounter time more than 25 minutes  Greater than 50% was spent in counseling and coordination of care with the patient   For questions or updates, please contact [facility] Please consult [email or web address] for contact info under        Signed, Ida Rogue, MD  10/06/2018, 3:31 PM

## 2018-10-06 NOTE — Progress Notes (Signed)
[place] [facility] [place] NAME: [name] [name]    MR#:  [id number]  DATE OF BIRTH:  08-Feb-1961  SUBJECTIVE:   Pt. shortness of breath significantly improved.  Patient had episode of some persistent ventricular tachycardia yesterday.  He has underlying cardiomyopathy ejection fraction of 30 to 35%.  Seen by cardiology and plan for nuclear medicine stress test tomorrow.  Shortness of breath has improved.  REVIEW OF SYSTEMS:    Review of Systems  Constitutional: Negative for chills and fever.  HENT: Negative for congestion and tinnitus.   Eyes: Negative for blurred vision and double vision.  Respiratory: Positive for shortness of breath. Negative for cough and wheezing.   Cardiovascular: Negative for chest pain, orthopnea and PND.  Gastrointestinal: Negative for abdominal pain, diarrhea, nausea and vomiting.  Genitourinary: Negative for dysuria and hematuria.  Neurological: Negative for dizziness, sensory change and focal weakness.  All other systems reviewed and are negative.   Nutrition: Heart Healthy/Carb modified Tolerating Diet: Yes Tolerating PT: Await Eval.   DRUG ALLERGIES:   Allergies  Allergen Reactions  . Other Other (See Comments)    Pt. And wife state that he had a reaction to "some" antibiotic but they do not know what the name of it was.They are are very apprehensive about him receiving any antibiotic. Zosyn, Clindamycin, & Vancomycin (all started together unsure which caused reaction 07/18/2013)    VITALS:  Blood pressure (!) 160/81, pulse 78, temperature 98 F (36.7 C), temperature source Oral, resp. rate 18, height 6\' 1"  (1.854 m), weight 117.8 kg, SpO2 91 %.  PHYSICAL EXAMINATION:   Physical Exam  GENERAL:  57 y.o.-year-old patient lying in bed in no acute distress.  EYES: Pupils equal, round, reactive to light and accommodation. No scleral icterus. Extraocular muscles intact.  HEENT: Head atraumatic,  normocephalic. Oropharynx and nasopharynx clear.  NECK:  Supple, no jugular venous distention. No thyroid enlargement, no tenderness.  LUNGS: Normal breath sounds bilaterally, no wheezing, rales, rhonchi. No use of accessory muscles of respiration.  CARDIOVASCULAR: S1, S2 normal. No murmurs, rubs, or gallops.  ABDOMEN: Soft, nontender, nondistended. Bowel sounds present. No organomegaly or mass.  EXTREMITIES: No cyanosis, clubbing or edema b/l.    NEUROLOGIC: Cranial nerves II through XII are intact. No focal Motor or sensory deficits b/l.   PSYCHIATRIC: The patient is alert and oriented x 3.  SKIN: No obvious rash, lesion, or ulcer.    LABORATORY PANEL:   CBC Recent Labs  Lab 10/05/18 0427  WBC 6.7  HGB 11.2*  HCT 35.3*  PLT 202   ------------------------------------------------------------------------------------------------------------------  Chemistries  Recent Labs  Lab 10/06/18 0510  NA 142  K 3.8  CL 106  CO2 29  GLUCOSE 82  BUN 36*  CREATININE 2.30*  CALCIUM 8.7*   ------------------------------------------------------------------------------------------------------------------  Cardiac Enzymes Recent Labs  Lab 10/04/18 1722  TROPONINI 0.03*   ------------------------------------------------------------------------------------------------------------------  RADIOLOGY:  Dg Chest 2 View  Result Date: 10/05/2018 CLINICAL DATA:  CHF. History of coronary artery disease, previous MI, and CABG. EXAM: CHEST - 2 VIEW COMPARISON:  PA and lateral chest x-ray of October 04, 2018 FINDINGS: The lungs are adequately inflated. The heart is top-normal in size. The pulmonary vascularity is less conspicuous today. There is no pleural effusion or alveolar infiltrate. There are post CABG changes. IMPRESSION: Interval decrease in the conspicuity of the pulmonary vascularity is consistent with improving CHF. Mild central pulmonary vascular prominence persists. Electronically  Signed   By:  David  Martinique M.D.   On: 10/05/2018 07:23   Dg Chest 2 View  Result Date: 10/04/2018 CLINICAL DATA:  Shortness of breath and left lower extremity swelling. History of CHF, previous MI. Current symptoms began recently. Nonsmoker. History of diabetes and hypertension. EXAM: CHEST - 2 VIEW COMPARISON:  Chest x-ray of August 25, 2014 FINDINGS: The lungs are adequately inflated. There is mild chronic elevation of the right hemidiaphragm. The interstitial markings are increased. The cardiac silhouette remains enlarged. The pulmonary vascularity is engorged and more prominent than on the previous study. There are post CABG changes. There is a trace of pleural fluid blunting the posterior costophrenic angles. IMPRESSION: Findings compatible with CHF with mild pulmonary vascular congestion and interstitial edema. Trace bilateral pleural effusions. Electronically Signed   By: David  Martinique M.D.   On: 10/04/2018 15:10   US Venous Img Lower Bilateral  Result Date: 10/04/2018 CLINICAL DATA:  Lower extremity swelling since earlier today. EXAM: BILATERAL LOWER EXTREMITY VENOUS DOPPLER ULTRASOUND TECHNIQUE: Gray-scale sonography with graded compression, as well as color Doppler and duplex ultrasound were performed to evaluate the lower extremity deep venous systems from the level of the common femoral vein and including the common femoral, femoral, profunda femoral, popliteal and calf veins including the posterior tibial, peroneal and gastrocnemius veins when visible. The superficial great saphenous vein was also interrogated. Spectral Doppler was utilized to evaluate flow at rest and with distal augmentation maneuvers in the common femoral, femoral and popliteal veins. COMPARISON:  Left lower extremity venous Doppler scan dated 07/20/2013 FINDINGS: RIGHT LOWER EXTREMITY Common Femoral Vein: No evidence of thrombus. Normal compressibility, respiratory phasicity and response to augmentation. Saphenofemoral  Junction: No evidence of thrombus. Normal compressibility and flow on color Doppler imaging. Profunda Femoral Vein: No evidence of thrombus. Normal compressibility and flow on color Doppler imaging. Femoral Vein: No evidence of thrombus. Normal compressibility, respiratory phasicity and response to augmentation. Popliteal Vein: No evidence of thrombus. Normal compressibility, respiratory phasicity and response to augmentation. Calf Veins: No evidence of thrombus. Normal compressibility and flow on color Doppler imaging. Superficial Great Saphenous Vein: No evidence of thrombus. Normal compressibility. Venous Reflux:  None. Other Findings:  None. LEFT LOWER EXTREMITY Common Femoral Vein: No evidence of thrombus. Normal compressibility, respiratory phasicity and response to augmentation. Saphenofemoral Junction: No evidence of thrombus. Normal compressibility and flow on color Doppler imaging. Profunda Femoral Vein: No evidence of thrombus. Normal compressibility and flow on color Doppler imaging. Femoral Vein: No evidence of thrombus. Normal compressibility, respiratory phasicity and response to augmentation. Popliteal Vein: No evidence of thrombus. Normal compressibility, respiratory phasicity and response to augmentation. Calf Veins: No evidence of thrombus. Normal compressibility and flow on color Doppler imaging. Superficial Great Saphenous Vein: No evidence of thrombus. Normal compressibility. Venous Reflux:  None. Other Findings:  None. IMPRESSION: No evidence of deep venous thrombosis in either lower extremity. Electronically Signed   By: Ilona Sorrel M.D.   On: 10/04/2018 18:57     ASSESSMENT AND PLAN:   57 year old male with past medical history of coronary artery disease status post previous bypass, chronic systolic CHF, CKD stage III, hyperlipidemia, history of orthostatic hypotension who presented to the hospital due to shortness of breath.  1.  Acute on chronic systolic CHF-secondary to  accelerated hypertension, noncompliance and also possible use of underlying Florinef. - Improved with IV diuresis, follow I's and O's and daily weights. - Clinically patient appears euvolemic now. -Continue carvedilol, hydralazine, Imdur, losartan.  Appreciate cardiology input.  2.  History of orthostatic hypotension-patient is currently not orthostatic.  Patient used to be on Florinef, this has been discontinued after discussion with his nephrologist it contributes to fluid retention.  If needed we can use midodrine in the future.  3.  Essential hypertension-continue carvedilol, losartan, Imdur.  4.  Diabetes type 2 with CKD stage III- continue sliding scale insulin, follow blood sugars.  5.  Hyperlipidemia-continue atorvastatin.  All the records are reviewed and case discussed with Care Management/Social Worker. Management plans discussed with the patient, family and they are in agreement.  CODE STATUS: Full code  DVT Prophylaxis: Hep SQ  TOTAL TIME TAKING CARE OF THIS PATIENT: 30 minutes.   POSSIBLE D/C IN 2-3 DAYS, DEPENDING ON CLINICAL CONDITION.   Henreitta Leber M.D on 10/06/2018 at 2:56 PM  Between 7am to 6pm - Pager - [phone]  After 6pm go to [email or web address] - Proofreader  [facility]  Office  [phone]  CC: Primary care physician; Tracie Harrier, MD

## 2018-10-06 NOTE — Care Management Note (Signed)
Case Management Note  Patient Details  Name: [name] [name] MRN: [id number] Date of Birth: 1960-12-01  Subjective/Objective:   Patient is from home with wife.  Admitted with acute on chronic CHF.  Receiving IV lasix.  Scheduled to have stress test tomorrow.  Has a functioning scale at home.  Current with PCP.  Denies issues obtaining medications for the most part.  When he gets into the "donut hole" his insulin gets very expensive.  He is on disability. This RNCM encouraged patient to let his PCP know when that happens and its possible the [facility] can assist him during that time.  Currently, he denies difficulty affording.  He has a new patient appointment with the heart failure clinic.  Heart failure booklet at bedside.  Does not appear to need any home health services at this time as he is independent and able to perform all ADL's.  No further needs identified by [facility] at this time.                  Action/Plan:   Expected Discharge Date:                  Expected Discharge Plan:  Home/Self Care  In-House Referral:     Discharge planning Services  CM Consult  Post Acute Care Choice:    Choice offered to:     DME Arranged:    DME Agency:     HH Arranged:    HH Agency:     Status of Service:  Completed, signed off  If discussed at [employer] of Stay Meetings, dates discussed:    Additional Comments:  Elza Rafter, RN 10/06/2018, 1:39 PM

## 2018-10-06 NOTE — Progress Notes (Deleted)
The patient has been in Afib RVR today sustaining in the 120s-130s with occasional jumps as high as 160-180. Dr Posey Pronto notified via text page. No new orders at this time. The patient is asympotmatic. Only complains of a cough. Robitussin ordered and given to pt.

## 2018-10-06 NOTE — Progress Notes (Addendum)
Progress Note  Patient Name: [name] [name] Date of Encounter: 10/06/2018  Primary Cardiologist: Ida Rogue, MD   Subjective   Patient had 40 beat run VT, asymptomatic with no c/o CP. On rounding today, he continues to remain asx with no reported CP, palpitations, feeling of racing heart rate. He is eating lunch.  Reviewed the plan for stress test tomorrow. Discussed the pathophysiology of heart failure at length as well as lifestyle changes and medical management.   Labs: Cr 2.30 K 3.8 I/O Net Since Admit -9552mL Upcoming -NM study tomorrow 11/22 -NPO after midnight, hold BB -Increased hydralazine, imdur for further BP control  Inpatient Medications    Scheduled Meds: . aspirin EC  81 mg Oral Daily  . atorvastatin  40 mg Oral QHS  . carvedilol  12.5 mg Oral BID WC  . ferrous sulfate  325 mg Oral Daily  . fludrocortisone  0.1 mg Oral QHS  . furosemide  20 mg Intravenous Q8H  . heparin  5,000 Units Subcutaneous Q8H  . hydrALAZINE  25 mg Oral BID  . insulin aspart  0-9 Units Subcutaneous TID WC  . isosorbide mononitrate  30 mg Oral Daily  . losartan  50 mg Oral Daily   Continuous Infusions:  PRN Meds: calcium carbonate, docusate sodium, labetalol, nitroGLYCERIN, zolpidem   Vital Signs    Vitals:   10/06/18 0500 10/06/18 0537 10/06/18 0747 10/06/18 0855  BP:  (!) 166/90 (!) 160/81   Pulse:  80 78   Resp:  18    Temp:  98.2 F (36.8 C) 98 F (36.7 C)   TempSrc:   Oral   SpO2:  94% 91%   Weight: 115.2 kg   117.8 kg  Height:        Intake/Output Summary (Last 24 hours) at 10/06/2018 1245 Last data filed at 10/06/2018 1019 Gross per 24 hour  Intake 720 ml  Output 4850 ml  Net -4130 ml   Filed Weights   10/05/18 0330 10/06/18 0500 10/06/18 0855  Weight: 120.7 kg 115.2 kg 117.8 kg    Telemetry    NSR and yesterday 11/20 had 40 beat run NSVT - Personally Reviewed  ECG    No new tracings- Personally Reviewed  Previous EKG NSR with 83bpm and  possible old anterior MI, LPFB  Physical Exam   GEN: No acute distress.  Joined by wife. Eating lunch Neck: No JVD Cardiac: RRR, no murmurs, rubs, or gallops.  Respiratory: Clear to auscultation bilaterally. GI: Obese, nontender MS: L leg with trace edema likely related to toe amputation and osteomyelitis, R leg without edema; No deformity. Neuro:  Nonfocal  Psych: Normal affect   Labs    Chemistry Recent Labs  Lab 10/04/18 1447 10/05/18 0427 10/06/18 0510  NA 142 145 142  K 4.9 4.0 3.8  CL 113* 112* 106  CO2 25 27 29   GLUCOSE 150* 87 82  BUN 32* 32* 36*  CREATININE 2.11* 2.20* 2.30*  CALCIUM 8.5* 8.8* 8.7*  GFRNONAA 34* 32* 30*  GFRAA 39* 37* 35*  ANIONGAP 4* 6 7     Hematology Recent Labs  Lab 10/04/18 1447 10/05/18 0427  WBC 7.2 6.7  RBC 3.66* 3.71*  HGB 11.2* 11.2*  HCT 35.2* 35.3*  MCV 96.2 95.1  MCH 30.6 30.2  MCHC 31.8 31.7  RDW 14.2 14.1  PLT 204 202    Cardiac Enzymes Recent Labs  Lab 10/04/18 1447 10/04/18 1722  TROPONINI 0.03* 0.03*   No results for input(s):  TROPIPOC in the last 168 hours.   BNP Recent Labs  Lab 10/04/18 1454  BNP 1,648.0*     DDimer No results for input(s): DDIMER in the last 168 hours.   Radiology    Dg Chest 2 View  Result Date: 10/05/2018 CLINICAL DATA:  CHF. History of coronary artery disease, previous MI, and CABG. EXAM: CHEST - 2 VIEW COMPARISON:  PA and lateral chest x-ray of October 04, 2018 FINDINGS: The lungs are adequately inflated. The heart is top-normal in size. The pulmonary vascularity is less conspicuous today. There is no pleural effusion or alveolar infiltrate. There are post CABG changes. IMPRESSION: Interval decrease in the conspicuity of the pulmonary vascularity is consistent with improving CHF. Mild central pulmonary vascular prominence persists. Electronically Signed   By: David  Martinique M.D.   On: 10/05/2018 07:23   Dg Chest 2 View  Result Date: 10/04/2018 CLINICAL DATA:  Shortness of  breath and left lower extremity swelling. History of CHF, previous MI. Current symptoms began recently. Nonsmoker. History of diabetes and hypertension. EXAM: CHEST - 2 VIEW COMPARISON:  Chest x-ray of August 25, 2014 FINDINGS: The lungs are adequately inflated. There is mild chronic elevation of the right hemidiaphragm. The interstitial markings are increased. The cardiac silhouette remains enlarged. The pulmonary vascularity is engorged and more prominent than on the previous study. There are post CABG changes. There is a trace of pleural fluid blunting the posterior costophrenic angles. IMPRESSION: Findings compatible with CHF with mild pulmonary vascular congestion and interstitial edema. Trace bilateral pleural effusions. Electronically Signed   By: David  Martinique M.D.   On: 10/04/2018 15:10   US Venous Img Lower Bilateral  Result Date: 10/04/2018 CLINICAL DATA:  Lower extremity swelling since earlier today. EXAM: BILATERAL LOWER EXTREMITY VENOUS DOPPLER ULTRASOUND TECHNIQUE: Gray-scale sonography with graded compression, as well as color Doppler and duplex ultrasound were performed to evaluate the lower extremity deep venous systems from the level of the common femoral vein and including the common femoral, femoral, profunda femoral, popliteal and calf veins including the posterior tibial, peroneal and gastrocnemius veins when visible. The superficial great saphenous vein was also interrogated. Spectral Doppler was utilized to evaluate flow at rest and with distal augmentation maneuvers in the common femoral, femoral and popliteal veins. COMPARISON:  Left lower extremity venous Doppler scan dated 07/20/2013 FINDINGS: RIGHT LOWER EXTREMITY Common Femoral Vein: No evidence of thrombus. Normal compressibility, respiratory phasicity and response to augmentation. Saphenofemoral Junction: No evidence of thrombus. Normal compressibility and flow on color Doppler imaging. Profunda Femoral Vein: No evidence of  thrombus. Normal compressibility and flow on color Doppler imaging. Femoral Vein: No evidence of thrombus. Normal compressibility, respiratory phasicity and response to augmentation. Popliteal Vein: No evidence of thrombus. Normal compressibility, respiratory phasicity and response to augmentation. Calf Veins: No evidence of thrombus. Normal compressibility and flow on color Doppler imaging. Superficial Great Saphenous Vein: No evidence of thrombus. Normal compressibility. Venous Reflux:  None. Other Findings:  None. LEFT LOWER EXTREMITY Common Femoral Vein: No evidence of thrombus. Normal compressibility, respiratory phasicity and response to augmentation. Saphenofemoral Junction: No evidence of thrombus. Normal compressibility and flow on color Doppler imaging. Profunda Femoral Vein: No evidence of thrombus. Normal compressibility and flow on color Doppler imaging. Femoral Vein: No evidence of thrombus. Normal compressibility, respiratory phasicity and response to augmentation. Popliteal Vein: No evidence of thrombus. Normal compressibility, respiratory phasicity and response to augmentation. Calf Veins: No evidence of thrombus. Normal compressibility and flow on color Doppler imaging. Superficial Great  Saphenous Vein: No evidence of thrombus. Normal compressibility. Venous Reflux:  None. Other Findings:  None. IMPRESSION: No evidence of deep venous thrombosis in either lower extremity. Electronically Signed   By: Ilona Sorrel M.D.   On: 10/04/2018 18:57    Cardiac Studies   TTE 10/05/2018 Study Conclusions - Left ventricle: The cavity size was moderately dilated. Systolic   function was moderately to severely reduced. The estimated   ejection fraction was in the range of 30% to 35%. Diffuse   hypokinesis. Regional wall motion abnormalities cannot be   excluded. Doppler parameters are consistent with abnormal left   ventricular relaxation (grade 1 diastolic dysfunction). - Mitral valve: There was  mild regurgitation. - Left atrium: The atrium was mildly dilated. - Right ventricle: Poorly visualized. Systolic function was normal. - Pulmonary arteries: Systolic pressure could not be accurately   estimated. Impressions: - Left pleural effusion noted, 8 cm  * EF 40-45% with [id number]   Patient Profile     57 y.o. male with a hx of CAD s/p CABG x4 04/2014, HFrEF (EF 40-45%, 2015) with ICM d/t HTN, asx orthostatic hypotension, HLD, uncontrolled DM2 on insulin with complications including neuropathy/foot ulceration /L toe amputations/osteomyelitis of L toe and foot, PVD, CKDIII, medication compliance d/t finances who is being seen today for the evaluation of HFrEF.  Assessment & Plan    Heart Failure Reduced Ejection Fraction (EF 30-35%) - In setting of poorly controlled HTN, worsening renal failure, diet noncompliance, worsening left ventricular function. TTE above from 11/20 shows large left pleural effusion 8 cm, EF 30 to 35% and decreased from 7/20 1840 to 45%.  Diffuse hypokinesis.  Regional wall motion abnormalities cannot be excluded. G1 DD.  Mild MR.  LAE mild. 11/20 - NSVT approximately 40 beats.  Asymptomatic.  No further NSVT.  In sinus rhythm.  Continue to monitor on telemetry -BP remains elevated with SBP in the 160s.  Increased hydralazine to 50mg  p BID and imdur to 60mg  po qd for further optimization of SBP.  Continue to monitor vitals closely. -N.p.o. after midnight in preparation for stress test tomorrow. Further recommendations following NM study.  If stress images show ischemia, cardiac catheterization will be need to considered with consideration of poor renal function and steps made to reduce contrast induced nephropathy prior to cardiac catheterization   Uncontrolled DM 2 with complication on insulin -Long history of poorly controlled diabetes. A1C improved from 13 to 6.2.  UA positive for protein and glucose. -Poor diet, medication compliance unclear. Long discussion  regarding the ramifications of poorly controlled diabetes and effect on kidneys  Malignant hypertension -Unable to add ACE inhibitor, ARB due to renal insufficiency.  In the past, taken off of ARB secondary to hyperkalemia but restarted by nephrology. -Hydralazine and isosorbide as above for blood pressure control -Hold beta-blocker prior to 11/22 mm steady & resume following study.  ICM -EF as above 30 to 35% and reduced from 05/2017 EF 40 to 45%   CAD S/P CABG x4 -Given low EF as above and NSVT 11/20, plan for stress testing tomorrow 11/22.  Further recommendations following results of NM study.  Acute on chronic kidney disease -Managed by Dr. Candiss Norse.  Likely exacerbated by poorly controlled diabetes and hypertension with low EF.  Consider cardiorenal syndrome.  Continue to follow renal function and electrolytes closely.  Replete electrolytes with potassium goal 4.0 and magnesium 2.0.  Daily Bumex.  Monitor I/O, daily weights.  Restrict fluids.  Pleural effusion  - Likely secondary  to CHF and to improve with diuresis.  Consider thoracentesis if no improvement     For questions or updates, please contact [place] Please consult [email or web address] for contact info under        Signed, Arvil Chaco, PA-C  10/06/2018, 12:45 PM        Attending Note Patient seen and examined, agree with detailed note above,  Patient presentation and plan discussed on rounds.   Acute on chronic diastolic and systolic CHF  poorly controlled hypertension, worsening renal failure, diet noncompliance,worsening LV function Large left pleural effusion 8 cm on exam Ejection fraction 30 to 35% --- We will continue Lasix 20 IV, decreased down to daily given renal dysfunction and he feels well  Uncontrolled DM2 on insulin with complication Long history of poorly controlled diabetes Poor diet, medication compliance unclear Hemoglobin A1c has improved this year through dietary changes,  medication compliance  Malignant hypertension On low-dose ARB started by nephrology as outpatient Increase hydralazine 50 3 times daily, increase isosorbide 60 daily  Ischemic cardiomyopathy Prelim estimate ejection fraction 30 to 35% Prior echocardiogram July 2018 ejection fraction 40 to 45% Recommend stress test tomorrow given concern for ischemia, drop in ejection fraction  CAD s/p CABG x4 Drop in ejection fraction, stress test ordered for tomorrow, able to treadmill  Acute on chronic kidney disease Managed by Dr. Candiss Norse Would likely benefit from Lasix 20 or 40 oral dosing daily as outpatient  Pleural effusion  Large, secondary to CHF Should improve with diuresis, Continue to follow-up as an outpatient, may need thoracentesis if it persists  Signed: Esmond Plants  M.D., Ph.D. [facility]

## 2018-10-07 ENCOUNTER — Inpatient Hospital Stay (HOSPITAL_COMMUNITY): Payer: Medicare HMO

## 2018-10-07 ENCOUNTER — Telehealth: Payer: Self-pay | Admitting: *Deleted

## 2018-10-07 DIAGNOSIS — I5043 Acute on chronic combined systolic (congestive) and diastolic (congestive) heart failure: Secondary | ICD-10-CM

## 2018-10-07 DIAGNOSIS — I255 Ischemic cardiomyopathy: Secondary | ICD-10-CM

## 2018-10-07 DIAGNOSIS — R7989 Other specified abnormal findings of blood chemistry: Secondary | ICD-10-CM

## 2018-10-07 LAB — BASIC METABOLIC PANEL
ANION GAP: 7 (ref 5–15)
BUN: 42 mg/dL — AB (ref 6–20)
CHLORIDE: 107 mmol/L (ref 98–111)
CO2: 28 mmol/L (ref 22–32)
Calcium: 8.3 mg/dL — ABNORMAL LOW (ref 8.9–10.3)
Creatinine, Ser: 2.35 mg/dL — ABNORMAL HIGH (ref 0.61–1.24)
GFR calc Af Amer: 34 mL/min — ABNORMAL LOW (ref >60)
GFR, EST NON AFRICAN AMERICAN: 29 mL/min — AB (ref >60)
GLUCOSE: 105 mg/dL — AB (ref 70–99)
POTASSIUM: 3.4 mmol/L — AB (ref 3.5–5.1)
Sodium: 142 mmol/L (ref 135–145)

## 2018-10-07 LAB — NM MYOCAR MULTI W/SPECT W/WALL MOTION / EF
CHL CUP NUCLEAR SSS: 14
LV sys vol: 118 mL
LVDIAVOL: 204 mL (ref 62–150)
NUC STRESS TID: 0.91
Peak HR: 88 {beats}/min
Percent HR: 53 %
Rest HR: 80 {beats}/min
SDS: 6
SRS: 9

## 2018-10-07 LAB — GLUCOSE, CAPILLARY
Glucose-Capillary: 97 mg/dL (ref 70–99)
Glucose-Capillary: 189 mg/dL — ABNORMAL HIGH (ref 70–99)

## 2018-10-07 MED ORDER — TECHNETIUM TC 99M TETROFOSMIN IV KIT
31.9200 | PACK | Freq: Once | INTRAVENOUS | Status: AC | PRN
  Administered 2018-10-07: 31.92 via INTRAVENOUS

## 2018-10-07 MED ORDER — POTASSIUM CHLORIDE CRYS ER 20 MEQ PO TBCR
20.0000 meq | EXTENDED_RELEASE_TABLET | Freq: Once | ORAL | Status: AC
  Administered 2018-10-07: 20 meq via ORAL
  Filled 2018-10-07: qty 1

## 2018-10-07 MED ORDER — HYDRALAZINE HCL 25 MG PO TABS: 25.0000 mg | ORAL_TABLET | Freq: Two times a day (BID) | ORAL | 1 refills | Status: DC

## 2018-10-07 MED ORDER — CARVEDILOL 25 MG PO TABS: 25.0000 mg | ORAL_TABLET | Freq: Two times a day (BID) | ORAL | 1 refills | Status: DC

## 2018-10-07 MED ORDER — POTASSIUM CHLORIDE ER 10 MEQ PO TBCR: 10.0000 meq | EXTENDED_RELEASE_TABLET | Freq: Every day | ORAL | 1 refills | Status: DC

## 2018-10-07 MED ORDER — TECHNETIUM TC 99M TETROFOSMIN IV KIT
10.4700 | PACK | Freq: Once | INTRAVENOUS | Status: AC | PRN
  Administered 2018-10-07: 10.47 via INTRAVENOUS

## 2018-10-07 MED ORDER — FUROSEMIDE 20 MG PO TABS: 40.0000 mg | ORAL_TABLET | Freq: Every day | ORAL | 1 refills | Status: DC

## 2018-10-07 MED ORDER — REGADENOSON 0.4 MG/5ML IV SOLN
0.4000 mg | Freq: Once | INTRAVENOUS | Status: AC
  Administered 2018-10-07: 0.4 mg via INTRAVENOUS

## 2018-10-07 MED ORDER — ISOSORBIDE MONONITRATE ER 60 MG PO TB24: 60.0000 mg | ORAL_TABLET | Freq: Every day | ORAL | 1 refills | Status: DC

## 2018-10-07 MED ORDER — HYDRALAZINE HCL 50 MG PO TABS: 50.0000 mg | ORAL_TABLET | Freq: Two times a day (BID) | ORAL | 1 refills | Status: DC

## 2018-10-07 MED ORDER — FUROSEMIDE 20 MG PO TABS: 20.0000 mg | ORAL_TABLET | Freq: Every day | ORAL | Status: DC

## 2018-10-07 NOTE — Plan of Care (Signed)
Nutrition Education Note  RD consulted for nutrition education regarding a Heart Healthy diet. Patient awake and partner at bedside at visit. Both report dietary changes prior to admission to promote healthy lifestyle. Improvements to A1c noted and patient reports willingness to continue to make lifestyle changes to improve quality of life.   Lipid Panel  No results found for: CHOL, TRIG, HDL, CHOLHDL, VLDL, LDLCALC  RD provided "Heart Healthy Nutrition Therapy" handout from the Academy of [employer] and [employer]etics. Reviewed patient's dietary recall. Provided examples on ways to decrease sodium and fat intake in diet. Discouraged intake of processed foods and use of salt shaker. Encouraged fresh fruits and vegetables as well as whole grain sources of carbohydrates to maximize fiber intake. Teach back method used.  Expect fair to excellent compliance.  Body mass index is 33.88 kg/m. Pt meets criteria for obesity based on current BMI.  Current diet order is HH/CHO mod, patient is consuming approximately 85% of meals at this time. Labs and medications reviewed. No further nutrition interventions warranted at this time. RD contact information provided. If additional nutrition issues arise, please re-consult RD.  Lajuan Lines, RD, LDN  After Hours/Weekend Pager: [phone]

## 2018-10-07 NOTE — Progress Notes (Addendum)
CCMD called to report 26 beats of v.tach / CCMD reports they are unable to send reports of CV strips / x2  RNs reviewed tele - no v.tach seen per our monitors/ pt asymptomatic / Dr. Verdell Carmine and Dr. Rockey Situ made aware / per Dr. Rockey Situ pt is to f/u in office Mon to have holter monitor placed / will continue to monitor.

## 2018-10-07 NOTE — Addendum Note (Signed)
Addended by: Vanessa Ralphs on: 10/07/2018 05:10 PM   Modules accepted: Orders

## 2018-10-07 NOTE — Telephone Encounter (Signed)
Patient currently admitted

## 2018-10-07 NOTE — Telephone Encounter (Signed)
Per Dr. Donivan Scull recommendation we will place a real-time ZIO monitor on the patient on 10/10/2018 in the setting of nonsustained VT.  Prior to discharge, his beta-blocker was increased by cardiologist.  He was noted to be hypokalemic on rounds this morning though this was repleted.

## 2018-10-07 NOTE — Discharge Summary (Signed)
[facility] [place] NAME: [name] [name]    MR#:  [id number]  DATE OF BIRTH:  [birth year]  DATE OF ADMISSION:  10/04/2018 ADMITTING PHYSICIAN: Vaughan Basta, MD  DATE OF DISCHARGE: 10/07/2018  PRIMARY CARE PHYSICIAN: Tracie Harrier, MD    ADMISSION DIAGNOSIS:  CHF (congestive heart failure) (HCC) [I50.9] Acute on chronic congestive heart failure, unspecified heart failure type ([place]) [I50.9]  DISCHARGE DIAGNOSIS:  Principal Problem:   Acute on chronic combined systolic and diastolic CHF (congestive heart failure) (HCC) Active Problems:   CAD (coronary artery disease)   Diabetes mellitus type 2 with complications (HCC)   Chronic kidney disease   Ischemic cardiomyopathy   SECONDARY DIAGNOSIS:   Past Medical History:  Diagnosis Date  . CAD (coronary artery disease)    a. 04/2014 CABG x 4 (LIMA->LAD, VG->Diag, VG->OM, VG->PDA).  . Chronic systolic CHF (congestive heart failure) ([place])    a. 07/2014 Echo: EF 30-35%.  . CKD (chronic kidney disease), stage III ([place])    a. 12/2015 Creat 1.8.  . Diabetic foot ulcers ([place])    a. left foot 2nd digit ant 5 th digit 05/03/14; b. 08/2014 s/p amputation of toes on L foot.  . Hypercholesterolemia    a. 12/2015 TC 116, TG 154, HDL 24, LDL 61-->Atorvastatin 40.  Marland Kitchen Hypertensive heart disease   . Ischemic cardiomyopathy    a. 07/2013 Echo: EF 20%; b. 07/2014 Echo: EF 30-35%, diff HK, Gr1 DD, mildly dil LA, nl PASP.  Marland Kitchen Myocardial infarction ([place])   . Neuropathy in diabetes ([place])   . Open wound    foot  . Orthostatic hypotension   . Peripheral vascular disease ([place])   . Type II diabetes mellitus ([place])    a. 12/2015 HbA1c = 13.9.    HOSPITAL COURSE:   57 year old male with past medical history of coronary artery disease status post previous bypass, chronic systolic CHF, CKD stage III, hyperlipidemia, history of orthostatic hypotension who presented to the hospital due to shortness of  breath.  1.  Acute on chronic systolic CHF-secondary to accelerated hypertension, noncompliance and also possible use of underlying Florinef. Patient was diuresed with IV Lasix and has improved. -Patient's blood pressure meds were adjusted as per cardiology, and patient is now being discharged on oral diuretics with Lasix and maintenance of his blood pressure meds as stated below. -Patient will follow-up with cardiology in the next week to 2 weeks.  2.  History of orthostatic hypotension-patient is currently not orthostatic.  Patient used to be on Florinef, this has been discontinued after discussion with his nephrologist as it's contributes to fluid retention.  If needed we can use midodrine in the future.  3.  Essential hypertension- his blood pressures have been somewhat labile.  Patient's Coreg dose has been increased from 12.5-25, he will continue losartan, his Imdur was raised, he was also given hydralazine.  He will follow his blood pressures closely at home.    4.  Diabetes type 2 with CKD stage III- the hospital patient was on sliding scale insulin, but he will resume his NovoLog with meals upon discharge.  Blood sugars are presently stable. - he will cont. Glimeperide.    5.  Hyperlipidemia- he will cont. Atorvastatin  6. Episodes of VT - pt. Had episodes of VT for which he was asymptomatic.  He underwent a Nuclear Medicine stress test with showed Pharmacological myocardial perfusion imaging study with large region of fixed inferior and inferolateral wall perfusion defect  consistent with previous MI Discussed with cardiology and patient's Coreg dose has been increased from 12.5-25.  Patient will follow-up with cardiology as outpatient.  He is being set up with a outpatient Holter monitor.  DISCHARGE CONDITIONS:   Stable.   CONSULTS OBTAINED:  Treatment Team:  Minna Merritts, MD  DRUG ALLERGIES:   Allergies  Allergen Reactions  . Other Other (See Comments)    Pt. And  wife state that he had a reaction to "some" antibiotic but they do not know what the name of it was.They are are very apprehensive about him receiving any antibiotic. Zosyn, Clindamycin, & Vancomycin (all started together unsure which caused reaction 07/18/2013)    DISCHARGE MEDICATIONS:   Allergies as of 10/07/2018      Reactions   Other Other (See Comments)   Pt. And wife state that he had a reaction to "some" antibiotic but they do not know what the name of it was.They are are very apprehensive about him receiving any antibiotic. Zosyn, Clindamycin, & Vancomycin (all started together unsure which caused reaction 07/18/2013)      Medication List    STOP taking these medications   fludrocortisone 0.1 MG tablet Commonly known as:  FLORINEF     TAKE these medications   aspirin EC 81 MG tablet Take 81 mg by mouth daily.   atorvastatin 40 MG tablet Commonly known as:  LIPITOR TAKE 1 TABLET EVERY DAY What changed:  when to take this   carvedilol 25 MG tablet Commonly known as:  COREG Take 1 tablet (25 mg total) by mouth 2 (two) times daily with a meal. What changed:    medication strength  See the new instructions.   ferrous sulfate 325 (65 FE) MG tablet Take 325 mg by mouth daily.   furosemide 20 MG tablet Commonly known as:  LASIX Take 2 tablets (40 mg total) by mouth daily. Start taking on:  10/08/2018   glimepiride 4 MG tablet Commonly known as:  AMARYL Take 4 mg by mouth 2 (two) times daily.   hydrALAZINE 50 MG tablet Commonly known as:  APRESOLINE Take 1 tablet (50 mg total) by mouth 2 (two) times daily.   isosorbide mononitrate 60 MG 24 hr tablet Commonly known as:  IMDUR Take 1 tablet (60 mg total) by mouth daily. Start taking on:  10/08/2018 What changed:    medication strength  how much to take   losartan 50 MG tablet Commonly known as:  COZAAR Take 50 mg by mouth daily.   nitroGLYCERIN 0.4 MG SL tablet Commonly known as:  NITROSTAT Place 1  tablet (0.4 mg total) under the tongue every 5 (five) minutes as needed for chest pain.   NOVOLOG FLEXPEN 100 UNIT/ML FlexPen Generic drug:  insulin aspart Inject 0-16 Units into the skin 3 (three) times daily with meals. Sliding scale insulin   potassium chloride 10 MEQ tablet Commonly known as:  K-DUR Take 1 tablet (10 mEq total) by mouth daily.   zolpidem 5 MG tablet Commonly known as:  AMBIEN Take 5 mg by mouth at bedtime as needed for sleep.         DISCHARGE INSTRUCTIONS:   DIET:  Cardiac diet and Diabetic diet  DISCHARGE CONDITION:  Stable  ACTIVITY:  Activity as tolerated  OXYGEN:  Home Oxygen: No.   Oxygen Delivery: room air  DISCHARGE LOCATION:  home   If you experience worsening of your admission symptoms, develop shortness of breath, life threatening emergency, suicidal or homicidal thoughts  you must seek medical attention immediately by calling 911 or calling your MD immediately  if symptoms less severe.  You Must read complete instructions/literature along with all the possible adverse reactions/side effects for all the Medicines you take and that have been prescribed to you. Take any new Medicines after you have completely understood and accpet all the possible adverse reactions/side effects.   Please note  You were cared for by a hospitalist during your hospital stay. If you have any questions about your discharge medications or the care you received while you were in the hospital after you are discharged, you can call the unit and asked to speak with the hospitalist on call if the hospitalist that took care of you is not available. Once you are discharged, your primary care physician will handle any further medical issues. Please note that NO REFILLS for any discharge medications will be authorized once you are discharged, as it is imperative that you return to your primary care physician (or establish a relationship with a primary care physician if you do  not have one) for your aftercare needs so that they can reassess your need for medications and monitor your lab values.     Today   Status post nuclear medicine stress test which showed previous fixed inferior and inferolateral wall perfusion defect consistent with previous MI -No acute chest pain, shortness of breath.  Will discharge home later today.  VITAL SIGNS:  Blood pressure (!) 167/87, pulse 96, temperature 97.7 F (36.5 C), temperature source Oral, resp. rate 20, height 6\' 1"  (1.854 m), weight 116.5 kg, SpO2 96 %.  I/O:    Intake/Output Summary (Last 24 hours) at 10/07/2018 1632 Last data filed at 10/07/2018 1419 Gross per 24 hour  Intake 480 ml  Output 2250 ml  Net -1770 ml    PHYSICAL EXAMINATION:   GENERAL:  57 y.o.-year-old patient lying in bed in no acute distress.  EYES: Pupils equal, round, reactive to light and accommodation. No scleral icterus. Extraocular muscles intact.  HEENT: Head atraumatic, normocephalic. Oropharynx and nasopharynx clear.  NECK:  Supple, no jugular venous distention. No thyroid enlargement, no tenderness.  LUNGS: Normal breath sounds bilaterally, no wheezing, rales, rhonchi. No use of accessory muscles of respiration.  CARDIOVASCULAR: S1, S2 normal. No murmurs, rubs, or gallops.  ABDOMEN: Soft, nontender, nondistended. Bowel sounds present. No organomegaly or mass.  EXTREMITIES: No cyanosis, clubbing or edema b/l.    NEUROLOGIC: Cranial nerves II through XII are intact. No focal Motor or sensory deficits b/l.   PSYCHIATRIC: The patient is alert and oriented x 3.  SKIN: No obvious rash, lesion, or ulcer.   DATA REVIEW:   CBC Recent Labs  Lab 10/05/18 0427  WBC 6.7  HGB 11.2*  HCT 35.3*  PLT 202    Chemistries  Recent Labs  Lab 10/07/18 0558  NA 142  K 3.4*  CL 107  CO2 28  GLUCOSE 105*  BUN 42*  CREATININE 2.35*  CALCIUM 8.3*    Cardiac Enzymes Recent Labs  Lab 10/04/18 1722  TROPONINI 0.03*     Microbiology Results  Results for orders placed or performed during the hospital encounter of 01/20/17  Surgical pcr screen     Status: None   Collection Time: 01/20/17 12:33 PM  Result Value Ref Range Status   MRSA, PCR NEGATIVE NEGATIVE Final   Staphylococcus aureus NEGATIVE NEGATIVE Final    Comment:        The Xpert SA Assay (FDA approved for  NASAL specimens in patients over 80 years of age), is one component of a comprehensive surveillance program.  Test performance has been validated by [facility] for patients greater than or equal to 95 year old. It is not intended to diagnose infection nor to guide or monitor treatment.     RADIOLOGY:  Nm Myocar Multi W/spect W/wall Motion / Ef  Result Date: 10/07/2018 Pharmacological myocardial perfusion imaging study with large region of fixed inferior and inferolateral wall perfusion defect consistent with previous MI Very mild ischemia in the inferolateral wall (peri-infarct ischemia) Hypokinesis noted in the inferior wall.  EF estimated at 36% No EKG changes concerning for ischemia at peak stress or in recovery. Moderate risk scan Similar findings on the study noted in 02/2018 and several years ago. Signed, Esmond Plants, MD, Ph.D [facility]      Management plans discussed with the patient, family and they are in agreement.  CODE STATUS:     Code Status Orders  (From admission, onward)         Start     Ordered   10/04/18 2015  Full code  Continuous     10/04/18 2014        TOTAL TIME TAKING CARE OF THIS PATIENT: 40 minutes.    Henreitta Leber M.D on 10/07/2018 at 4:32 PM  Between 7am to 6pm - Pager - [phone]  After 6pm go to [email or web address] - Proofreader  [facility]  Office  [phone]  CC: Primary care physician; Tracie Harrier, MD

## 2018-10-07 NOTE — Telephone Encounter (Signed)
-----   Message from Rise Mu, PA-C sent at 10/07/2018 12:39 PM EST ----- Please schedule patient for bmet 11/25. Dx: CKD stage III

## 2018-10-07 NOTE — Care Management Important Message (Signed)
Copy of signed IM left with patient in room.  

## 2018-10-07 NOTE — Telephone Encounter (Signed)
Called patient. He is aware to go to the [facility] on Monday, 10/10/18, for lab work. He is also aware he will be mailed a ZIO monitor.  Patient registered on [facility] website.

## 2018-10-07 NOTE — Progress Notes (Signed)
Progress Note  Patient Name: [name] [name] Date of Encounter: 10/07/2018  Primary Cardiologist: Rockey Situ  Subjective   Feels well this morning. No chest pain or SOB. Lower extremity swelling resolved. For Myoview this morning. BP elevated in the 149F to 026V systolic. Florinef has been held. Documented UOP of 2.8 L for the past 24 hours with a net negative of 11.3 L for the admission (intake not noted). Weight 124-->116.5 kg. Renal function trending up to 2.35 this morning.  Inpatient Medications    Scheduled Meds: . aspirin EC  81 mg Oral Daily  . atorvastatin  40 mg Oral QHS  . carvedilol  12.5 mg Oral BID WC  . ferrous sulfate  325 mg Oral Daily  . furosemide  20 mg Intravenous Daily  . heparin  5,000 Units Subcutaneous Q8H  . hydrALAZINE  50 mg Oral BID  . insulin aspart  0-9 Units Subcutaneous TID WC  . isosorbide mononitrate  60 mg Oral Daily  . losartan  50 mg Oral Daily   Continuous Infusions:  PRN Meds: calcium carbonate, docusate sodium, labetalol, nitroGLYCERIN, zolpidem   Vital Signs    Vitals:   10/06/18 1958 10/07/18 0417 10/07/18 0500 10/07/18 0757  BP: 135/70 (!) 158/83  (!) 166/83  Pulse: 83 90  80  Resp: (!) 24 20    Temp: 98.1 F (36.7 C) 98.4 F (36.9 C)  97.7 F (36.5 C)  TempSrc: Oral Oral  Oral  SpO2: 93% 96%  95%  Weight:   116.5 kg   Height:        Intake/Output Summary (Last 24 hours) at 10/07/2018 1112 Last data filed at 10/07/2018 0800 Gross per 24 hour  Intake 480 ml  Output 2280 ml  Net -1800 ml   Filed Weights   10/06/18 0500 10/06/18 0855 10/07/18 0500  Weight: 115.2 kg 117.8 kg 116.5 kg    Telemetry    NSR - Personally Reviewed  ECG    n/a - Personally Reviewed  Physical Exam   GEN: No acute distress. Obese.  Neck: No JVD. Cardiac: RRR, no murmurs, rubs, or gallops.  Respiratory: Clear to auscultation bilaterally.  GI: Soft, nontender, non-distended.   MS: No edema; No deformity. Neuro:  Alert and  oriented x 3; Nonfocal.  Psych: Normal affect.  Labs    Chemistry Recent Labs  Lab 10/05/18 0427 10/06/18 0510 10/07/18 0558  NA 145 142 142  K 4.0 3.8 3.4*  CL 112* 106 107  CO2 27 29 28   GLUCOSE 87 82 105*  BUN 32* 36* 42*  CREATININE 2.20* 2.30* 2.35*  CALCIUM 8.8* 8.7* 8.3*  GFRNONAA 32* 30* 29*  GFRAA 37* 35* 34*  ANIONGAP 6 7 7      Hematology Recent Labs  Lab 10/04/18 1447 10/05/18 0427  WBC 7.2 6.7  RBC 3.66* 3.71*  HGB 11.2* 11.2*  HCT 35.2* 35.3*  MCV 96.2 95.1  MCH 30.6 30.2  MCHC 31.8 31.7  RDW 14.2 14.1  PLT 204 202    Cardiac Enzymes Recent Labs  Lab 10/04/18 1447 10/04/18 1722  TROPONINI 0.03* 0.03*   No results for input(s): TROPIPOC in the last 168 hours.   BNP Recent Labs  Lab 10/04/18 1454  BNP 1,648.0*     DDimer No results for input(s): DDIMER in the last 168 hours.   Radiology    No results found.  Cardiac Studies   Echo 10/05/2018: Study Conclusions  - Left ventricle: The cavity size was moderately dilated.  Systolic   function was moderately to severely reduced. The estimated   ejection fraction was in the range of 30% to 35%. Diffuse   hypokinesis. Regional wall motion abnormalities cannot be   excluded. Doppler parameters are consistent with abnormal left   ventricular relaxation (grade 1 diastolic dysfunction). - Mitral valve: There was mild regurgitation. - Left atrium: The atrium was mildly dilated. - Right ventricle: Poorly visualized. Systolic function was normal. - Pulmonary arteries: Systolic pressure could not be accurately   estimated.  Impressions:  - Left pleural effusion noted, 8 cm __________  Myoview pending.   Patient Profile     57 y.o. male with history of CAD s/p CABG x4 04/2014, HFrEF secondary to ICM (EF 40-45%, 2015), HTN, orthostatic hypotension, HLD, uncontrolled DM2 on insulin with complications including neuropathy/foot ulceration /L toe amputations/osteomyelitis of L toe and  foot, PVD, CKDIII, medication compliance d/t financeswho is being seen today for the evaluation of HFrEF  Assessment & Plan    1. Acute on chronic combined CHF: -In the setting of noncompliance  -Much improved -Transition from IV Lasix to PO Lasix 20 mg daily (next dose 11/23) -Coreg and losartan -Not on spironolactone given acute on CKD  2. CAD s/p CABG: -Given reduction in EF this admission he has undergone Lexiscan Myoview this morning with results pending at this time -Coreg, ASA, Lipitor, Imdur -Await Myoview results  3. Acute on CKD stage III: -Relatively stable -Transition to PO Lasix as above -Recommend recheck bmet 11/25 (message sent to our office)  4. Orthostatic hypotension: -Improved -Florinef held  5. Pleural effusion: -Symptoms improving with diuresis   6. HTN: -BP now elevated into the 572I systolic -Remains on Coreg, losartan, Imdur, and hydralazine    For questions or updates, please contact [facility] Please consult [email or web address] for contact info under Cardiology/STEMI.    Signed, Christell Faith, PA-C [facility] Pager: [phone] 10/07/2018, 11:12 AM

## 2018-10-10 ENCOUNTER — Other Ambulatory Visit
Admission: RE | Admit: 2018-10-10 | Discharge: 2018-10-10 | Disposition: A | Discharge status: Home / Self Care | Payer: Medicare HMO | Source: Ambulatory Visit | Attending: Physician Assistant | Admitting: Physician Assistant

## 2018-10-10 DIAGNOSIS — I5043 Acute on chronic combined systolic (congestive) and diastolic (congestive) heart failure: Secondary | ICD-10-CM | POA: Diagnosis not present

## 2018-10-10 DIAGNOSIS — I255 Ischemic cardiomyopathy: Secondary | ICD-10-CM | POA: Diagnosis not present

## 2018-10-10 LAB — BASIC METABOLIC PANEL
Anion gap: 9 (ref 5–15)
BUN: 53 mg/dL — AB (ref 6–20)
CALCIUM: 8 mg/dL — AB (ref 8.9–10.3)
CHLORIDE: 108 mmol/L (ref 98–111)
CO2: 23 mmol/L (ref 22–32)
Creatinine, Ser: 2.83 mg/dL — ABNORMAL HIGH (ref 0.61–1.24)
GFR calc Af Amer: 27 mL/min — ABNORMAL LOW (ref >60)
GFR, EST NON AFRICAN AMERICAN: 24 mL/min — AB (ref >60)
GLUCOSE: 106 mg/dL — AB (ref 70–99)
POTASSIUM: 3.8 mmol/L (ref 3.5–5.1)
SODIUM: 140 mmol/L (ref 135–145)

## 2018-10-11 ENCOUNTER — Other Ambulatory Visit: Payer: Self-pay

## 2018-10-11 MED ORDER — FUROSEMIDE 20 MG PO TABS: 20.0000 mg | ORAL_TABLET | Freq: Every day | ORAL | 2 refills | Status: DC

## 2018-10-11 NOTE — Addendum Note (Signed)
Addended by: Vanessa Ralphs on: 10/11/2018 11:35 AM   Modules accepted: Orders

## 2018-10-11 NOTE — Telephone Encounter (Signed)
S/w ZIO representative. States they are waiting on insurance approval from their end. Then they will call the patient and discuss cost with the patient prior to mailing it out.  Should be able to ship the monitor out today. They are checking on the insurance claim again and should be able to approve it today.

## 2018-10-11 NOTE — Patient Outreach (Signed)
[facility]) Care Management  10/11/2018  [name] [name] [birth year] [id number]  EMMI: general discharge RED alert Referral date: 10/11/18 Referral reason: know who to call about changes in condition Insurance: [employer] Day # 1  Telephone call to patient regarding EMMI general discharge red alert. HIPAA verified with patient. Explained reason for call.  Patient states he talked with his cardiology office this morning. Patient states he was concerned because his creatinine level had gone up as noted from his labs.  Patient states his doctor made some adjustments with his medications today and he was advised to return for lab work on within a few days.  Patient states he has a follow up visit with his cardiologist on 10/22/18.   Patient states he saw his primary MD within the past month for a check up.  Patient reports his next follow up with his primary MD in in March 2020.   Patient states he has transportation to his appointments. Patient states he is taking his medications as directed.  Patient denies any further needs/ concerns.   RNCM advised patient to notify MD of any changes in condition prior to scheduled appointment. RNCM provided contact name and number: 24 hour nurse advise line [phone].  RNCM verified patient aware of 911 services for urgent/ emergent needs.  PLAN; RNCM will close patient due to patient being assessed and having no further needs.   [name] RN,BSN,CCM [facility] Telephonic  [phone]

## 2018-10-11 NOTE — Addendum Note (Signed)
Addended by: Vanessa Ralphs on: 10/11/2018 09:43 AM   Modules accepted: Orders

## 2018-10-11 NOTE — Patient Outreach (Signed)
[facility]) Care Management  10/11/2018  JANN RA 01/23/1961 [id number]  EMMI: general discharge Referral date: 10/11/18 Referral reason: know who to call about changes in condition Insurance: [employer] Day # 1  Telephone call to patient regarding EMMI general discharge red alert. Unable to reach patient. HIPAA compliant voice message left with call back phone number.   PLAN: RNCM will attempt 2nd telephone call to patient within 4 business days  RNCm will send outreach letter to patient.   [name] RN,BSN,CCM [facility] Telephonic  [phone]

## 2018-10-11 NOTE — Telephone Encounter (Signed)
°  1. Is this related to a heart monitor you are wearing? no 2. What is your issue??  Patient has not received zio in mail or heard anything from zio via phone please advise on process and timeframe to receive   Also call to go over yesterday lab results   Please route to covering RN/CMA/RMA for results. Route to monitor technicians or your monitor tech representative for your site for any technical concerns

## 2018-10-11 NOTE — Addendum Note (Signed)
Addended by: Vanessa Ralphs on: 10/11/2018 09:39 AM   Modules accepted: Orders

## 2018-10-11 NOTE — Telephone Encounter (Signed)
Called and spoke with patient and he was appreciative of the call and is now aware of the status with ZIO.  Lab results also given and verbalized understanding of how to take medications as follows and to get repeat lab work at [facility] on Friday 11/29. He already has nephrology appointment set up for 10/21/18:  Labs show worsening renal function in a prerenal state with elevated BUN and serum creatinine. Recommend he hold Lasix and losartan for 2 days and increase water intake. Following these 2 days he should resume Lasix at a lower dose of 20 mg daily. Recheck BMP on Friday, 11/29. If renal function is improved at that time would restart losartan. If he does not already have a nephrologist, recommend he be referred to nephrology.    Also, released to [facility].

## 2018-10-12 ENCOUNTER — Encounter: Payer: Self-pay | Admitting: Family

## 2018-10-12 ENCOUNTER — Ambulatory Visit: Payer: Medicare HMO | Attending: Family | Admitting: Family

## 2018-10-12 VITALS — BP 153/79 | HR 73 | Resp 18 | Ht 73.0 in | Wt 264.4 lb

## 2018-10-12 DIAGNOSIS — I255 Ischemic cardiomyopathy: Secondary | ICD-10-CM | POA: Diagnosis not present

## 2018-10-12 DIAGNOSIS — I509 Heart failure, unspecified: Secondary | ICD-10-CM | POA: Diagnosis present

## 2018-10-12 DIAGNOSIS — Z79899 Other long term (current) drug therapy: Secondary | ICD-10-CM | POA: Insufficient documentation

## 2018-10-12 DIAGNOSIS — I130 Hypertensive heart and chronic kidney disease with heart failure and stage 1 through stage 4 chronic kidney disease, or unspecified chronic kidney disease: Secondary | ICD-10-CM | POA: Diagnosis not present

## 2018-10-12 DIAGNOSIS — E11621 Type 2 diabetes mellitus with foot ulcer: Secondary | ICD-10-CM | POA: Insufficient documentation

## 2018-10-12 DIAGNOSIS — E1140 Type 2 diabetes mellitus with diabetic neuropathy, unspecified: Secondary | ICD-10-CM | POA: Insufficient documentation

## 2018-10-12 DIAGNOSIS — Z951 Presence of aortocoronary bypass graft: Secondary | ICD-10-CM | POA: Insufficient documentation

## 2018-10-12 DIAGNOSIS — N183 Chronic kidney disease, stage 3 (moderate): Secondary | ICD-10-CM | POA: Insufficient documentation

## 2018-10-12 DIAGNOSIS — Z89422 Acquired absence of other left toe(s): Secondary | ICD-10-CM | POA: Insufficient documentation

## 2018-10-12 DIAGNOSIS — Z8249 Family history of ischemic heart disease and other diseases of the circulatory system: Secondary | ICD-10-CM | POA: Insufficient documentation

## 2018-10-12 DIAGNOSIS — I252 Old myocardial infarction: Secondary | ICD-10-CM | POA: Insufficient documentation

## 2018-10-12 DIAGNOSIS — Z794 Long term (current) use of insulin: Secondary | ICD-10-CM | POA: Diagnosis not present

## 2018-10-12 DIAGNOSIS — L97509 Non-pressure chronic ulcer of other part of unspecified foot with unspecified severity: Secondary | ICD-10-CM | POA: Diagnosis not present

## 2018-10-12 DIAGNOSIS — I5022 Chronic systolic (congestive) heart failure: Secondary | ICD-10-CM | POA: Diagnosis not present

## 2018-10-12 DIAGNOSIS — E7800 Pure hypercholesterolemia, unspecified: Secondary | ICD-10-CM | POA: Diagnosis not present

## 2018-10-12 DIAGNOSIS — E1151 Type 2 diabetes mellitus with diabetic peripheral angiopathy without gangrene: Secondary | ICD-10-CM | POA: Insufficient documentation

## 2018-10-12 DIAGNOSIS — E1122 Type 2 diabetes mellitus with diabetic chronic kidney disease: Secondary | ICD-10-CM | POA: Diagnosis not present

## 2018-10-12 DIAGNOSIS — I10 Essential (primary) hypertension: Secondary | ICD-10-CM | POA: Insufficient documentation

## 2018-10-12 DIAGNOSIS — Z7982 Long term (current) use of aspirin: Secondary | ICD-10-CM | POA: Insufficient documentation

## 2018-10-12 DIAGNOSIS — I2510 Atherosclerotic heart disease of native coronary artery without angina pectoris: Secondary | ICD-10-CM | POA: Insufficient documentation

## 2018-10-12 DIAGNOSIS — E118 Type 2 diabetes mellitus with unspecified complications: Secondary | ICD-10-CM

## 2018-10-12 NOTE — Telephone Encounter (Signed)
I would order the ZIO XT That should be fine

## 2018-10-12 NOTE — Telephone Encounter (Signed)
Found out from [place]. In precert that [facility] did receive necessary documents but the claim must go the the Medical Director for further review as to whether insurance would cover this. Caryl Pina says insurance would cover the ZIO XT which is not real time.  Patient advised and was appreciative.  Routing to Dr Rockey Situ to make him aware and to advise if ok for patient to wear the ZIO XT if the AT is not approved.

## 2018-10-12 NOTE — Telephone Encounter (Signed)
°  1. Is this related to a heart monitor you are wearing? No   2. What is your issue??  Patient has not received zio in mail and was told [employer] did not approve because they do not have clinical notes   Please call to update patient

## 2018-10-12 NOTE — Progress Notes (Signed)
Patient ID: [name] [name], male    DOB: June 12, 1961, 57 y.o.   MRN: [id number]  HPI  Mr [name] [name] is a 57 y/o male with a history of CAD, DM, hyperlipidemia, HTN, CKD, PVD,orthostatic hypotension and chronic heart failure.   Echo report from 10/05/18 reviewed and showed an EF of 30-35% along with mild MR.   Cardiac catheterization done 04/03/14 which showed an EF of 40%.  Admitted 10/04/18 due to acute on chronic heart failure. Cardiology consult obtained. Initially needed IV lasix and then transitioned to oral diuretics. Medications adjusted due to hypotension. Discharged after 3 days.   He presents today for his initial visit with a chief complaint of minimal shortness of breath upon moderate exertion. He describes this as chronic in nature over the last several months. He has associated fatigue, light-headedness and chronic neuropathy. He denies any difficulty sleeping, abdominal distention, palpitations, pedal edema, chest pain, cough or weight gain.   Past Medical History:  Diagnosis Date  . CAD (coronary artery disease)    a. 04/2014 CABG x 4 (LIMA->LAD, VG->Diag, VG->OM, VG->PDA).  . Chronic systolic CHF (congestive heart failure) ([place])    a. 07/2014 Echo: EF 30-35%.  . CKD (chronic kidney disease), stage III ([place])    a. 12/2015 Creat 1.8.  . Diabetic foot ulcers ([place])    a. left foot 2nd digit ant 5 th digit 05/03/14; b. 08/2014 s/p amputation of toes on L foot.  . Hypercholesterolemia    a. 12/2015 TC 116, TG 154, HDL 24, LDL 61-->Atorvastatin 40.  Marland Kitchen Hypertension   . Hypertensive heart disease   . Ischemic cardiomyopathy    a. 07/2013 Echo: EF 20%; b. 07/2014 Echo: EF 30-35%, diff HK, Gr1 DD, mildly dil LA, nl PASP.  Marland Kitchen Myocardial infarction ([place])   . Neuropathy in diabetes ([place])   . Open wound    foot  . Orthostatic hypotension   . Peripheral vascular disease ([place])   . Type II diabetes mellitus ([place])    a. 12/2015 HbA1c = 13.9.   Past Surgical History:  Procedure Laterality  Date  . ACHILLES TENDON SURGERY Right 01/22/2017   Procedure: ACHILLES LENGTHENING/KIDNER/TAL/Teno achilles lengthening;  Surgeon: Samara Deist, DPM;  Location: [facility] ORS;  Service: Podiatry;  Laterality: Right;  . CARDIAC CATHETERIZATION     03/2014  . CATARACT EXTRACTION    . CORONARY ARTERY BYPASS GRAFT N/A 05/07/2014   Procedure: CORONARY ARTERY BYPASS GRAFTING (CABG);  Surgeon: Gaye Pollack, MD;  Location: [facility];  Service: Open Heart Surgery;  Laterality: N/A;  Times 4 using left internal mammary artery and endoscopically harvested right saphenous vein  . INTRAOPERATIVE TRANSESOPHAGEAL ECHOCARDIOGRAM N/A 05/07/2014   Procedure: INTRAOPERATIVE TRANSESOPHAGEAL ECHOCARDIOGRAM;  Surgeon: Gaye Pollack, MD;  Location: [facility] OR;  Service: Open Heart Surgery;  Laterality: N/A;  . left foot osteomyelitis and wound after surgery    . TOE AMPUTATION     Left 3 and 4 toes  . TONSILECTOMY/ADENOIDECTOMY WITH MYRINGOTOMY     Family History  Problem Relation Age of Onset  . Heart disease Father        CABG in his 63's  . Diabetes type II Unknown   . Kidney disease Unknown   . Hypertension Unknown    Social History   Tobacco Use  . Smoking status: Never Smoker  . Smokeless tobacco: Never Used  Substance Use Topics  . Alcohol use: No   Allergies  Allergen Reactions  . Other Other (See Comments)  Pt. And wife state that he had a reaction to "some" antibiotic but they do not know what the name of it was.They are are very apprehensive about him receiving any antibiotic. Zosyn, Clindamycin, & Vancomycin (all started together unsure which caused reaction 07/18/2013)   Prior to Admission medications   Medication Sig Start Date End Date Taking? Authorizing Provider  aspirin EC 81 MG tablet Take 81 mg by mouth daily.   Yes [provider]  atorvastatin (LIPITOR) 40 MG tablet TAKE 1 TABLET EVERY DAY Patient taking differently: Take 40 mg by mouth at bedtime.  05/11/18  Yes Minna Merritts,  MD  carvedilol (COREG) 25 MG tablet Take 1 tablet (25 mg total) by mouth 2 (two) times daily with a meal. 10/07/18 12/06/18 Yes Sainani, Belia Heman, MD  ferrous sulfate 325 (65 FE) MG tablet Take 325 mg by mouth daily.   Yes [provider]  glimepiride (AMARYL) 4 MG tablet Take 4 mg by mouth 2 (two) times daily. 11/20/16  Yes [provider]  hydrALAZINE (APRESOLINE) 25 MG tablet Take 1 tablet (25 mg total) by mouth 2 (two) times daily. 10/07/18 12/06/18 Yes Sainani, Belia Heman, MD  insulin aspart (NOVOLOG FLEXPEN) 100 UNIT/ML FlexPen Inject 0-16 Units into the skin 3 (three) times daily with meals. Sliding scale insulin   Yes [provider]  isosorbide mononitrate (IMDUR) 60 MG 24 hr tablet Take 1 tablet (60 mg total) by mouth daily. 10/08/18 12/07/18 Yes Sainani, Belia Heman, MD  nitroGLYCERIN (NITROSTAT) 0.4 MG SL tablet Place 1 tablet (0.4 mg total) under the tongue every 5 (five) minutes as needed for chest pain. 12/24/15  Yes Theora Gianotti, NP  potassium chloride (K-DUR) 10 MEQ tablet Take 1 tablet (10 mEq total) by mouth daily. 10/07/18 12/06/18 Yes Sainani, Belia Heman, MD  furosemide (LASIX) 20 MG tablet Take 1 tablet (20 mg total) by mouth daily. Patient not taking: Reported on 10/12/2018 10/11/18 12/10/18  Rise Mu, PA-C  losartan (COZAAR) 50 MG tablet Take 50 mg by mouth daily.    [provider]  zolpidem (AMBIEN) 5 MG tablet Take 5 mg by mouth at bedtime as needed for sleep.    [provider]    Review of Systems  Constitutional: Positive for fatigue. Negative for appetite change.  HENT: Negative for congestion, postnasal drip and sore throat.   Eyes: Negative.   Respiratory: Positive for shortness of breath (with moderate exertion). Negative for chest tightness.   Cardiovascular: Negative for chest pain, palpitations and leg swelling.  Gastrointestinal: Negative for abdominal distention and abdominal pain.  Endocrine: Negative.    Genitourinary: Negative.   Musculoskeletal: Negative for back pain and neck pain.  Skin: Positive for wound (right foot).  Allergic/Immunologic: Negative.   Neurological: Positive for light-headedness (at times) and numbness (neuropathy). Negative for dizziness.  Hematological: Negative for adenopathy. Does not bruise/bleed easily.  Psychiatric/Behavioral: Negative for dysphoric mood and sleep disturbance (sleeping on 1-2 pillows). The patient is not nervous/anxious.    Vitals:   10/12/18 0916  BP: (!) 153/79  Pulse: 73  Resp: 18  SpO2: 100%  Weight: 264 lb 6 oz (119.9 kg)  Height: 6\' 1"  (1.854 m)   Wt Readings from Last 3 Encounters:  10/12/18 264 lb 6 oz (119.9 kg)  10/07/18 256 lb 12.8 oz (116.5 kg)  02/15/18 270 lb 12 oz (122.8 kg)   Lab Results  Component Value Date   CREATININE 2.83 (H) 10/10/2018   CREATININE 2.35 (H)  10/07/2018   CREATININE 2.30 (H) 10/06/2018    Physical Exam  Constitutional: He is oriented to person, place, and time. He appears well-developed and well-nourished.  HENT:  Head: Normocephalic and atraumatic.  Neck: Normal range of motion.  Cardiovascular: Normal rate and regular rhythm.  Pulmonary/Chest: Effort normal. He has no wheezes. He has no rales.  Abdominal: Soft. He exhibits no distension.  Musculoskeletal: He exhibits no edema or tenderness.  Neurological: He is alert and oriented to person, place, and time.  Skin: Skin is warm and dry.  Psychiatric: He has a normal mood and affect. His behavior is normal. Thought content normal.  Nursing note and vitals reviewed.  Assessment & Plan:  1: Chronic heart failure with reduced ejection fraction- - NYHA class II - euvolemic - weighing daily; reminded to call for an overnight weight gain of >2 pounds or a weekly weight gain of >5 pounds - not adding salt to his food and has been reading food labels. Reviewed the importance of closely following a 2000mg  sodium diet and written dietary  information was given to him about this - losartan/furosemide currently on hold due to renal function - saw cardiology Rockey Situ) 02/15/18 - BNP 10/04/18 was 1648.0 - he says that he's already received his flu vaccine - PharmD reconciled medications with the patient  2: HTN- - BP mildly elevated today but medications are currently on hold; has history of orthostatic hypotension - saw PCP (Hande) 09/12/18 - BMP 10/10/18 reviewed and showed sodium 140, potassium 3.8, creatinine 2.83 and GFR 24  3: DM-  - seeing podiatry due to foot ulcer - A1c 10/05/18 was 6.2%  Patient did not bring his medications nor a list. Each medication was verbally reviewed with the patient and he was encouraged to bring the bottles to every visit to confirm accuracy of list.  Return in 6 weeks or sooner for any questions/problems before then.

## 2018-10-12 NOTE — Patient Instructions (Signed)
Continue weighing daily and call for an overnight weight gain of > 2 pounds or a weekly weight gain of >5 pounds. 

## 2018-10-14 ENCOUNTER — Other Ambulatory Visit
Admission: RE | Admit: 2018-10-14 | Discharge: 2018-10-14 | Disposition: A | Discharge status: Home / Self Care | Payer: Medicare HMO | Source: Ambulatory Visit | Attending: Physician Assistant | Admitting: Physician Assistant

## 2018-10-14 DIAGNOSIS — I5043 Acute on chronic combined systolic (congestive) and diastolic (congestive) heart failure: Secondary | ICD-10-CM | POA: Diagnosis not present

## 2018-10-14 DIAGNOSIS — R7989 Other specified abnormal findings of blood chemistry: Secondary | ICD-10-CM | POA: Insufficient documentation

## 2018-10-14 DIAGNOSIS — I255 Ischemic cardiomyopathy: Secondary | ICD-10-CM

## 2018-10-14 LAB — BASIC METABOLIC PANEL
Anion gap: 9 (ref 5–15)
BUN: 53 mg/dL — AB (ref 6–20)
CHLORIDE: 109 mmol/L (ref 98–111)
CO2: 21 mmol/L — ABNORMAL LOW (ref 22–32)
CREATININE: 2.91 mg/dL — AB (ref 0.61–1.24)
Calcium: 8.2 mg/dL — ABNORMAL LOW (ref 8.9–10.3)
GFR calc Af Amer: 27 mL/min — ABNORMAL LOW (ref >60)
GFR, EST NON AFRICAN AMERICAN: 23 mL/min — AB (ref >60)
GLUCOSE: 167 mg/dL — AB (ref 70–99)
POTASSIUM: 5.4 mmol/L — AB (ref 3.5–5.1)
Sodium: 139 mmol/L (ref 135–145)

## 2018-10-16 ENCOUNTER — Telehealth: Payer: Self-pay | Admitting: Nurse Practitioner

## 2018-10-16 NOTE — Telephone Encounter (Signed)
   Pts wife called this afternoon to report that her husband, Mr. Stony, has had progressive swelling, dyspnea, and orthopnea, over the past few days in the setting of CHF and CKD necessitating in holding lasix recently.  She says he is really struggling at home the past two days.  I've recommended that he present to the [facility] ED for evaluation and probable admission.  Caller verbalized understanding and was grateful for the call back.  Murray Hodgkins, NP 10/16/2018, 1:06 PM

## 2018-10-17 ENCOUNTER — Telehealth: Payer: Self-pay | Admitting: Cardiovascular Disease

## 2018-10-17 ENCOUNTER — Ambulatory Visit: Payer: Medicare HMO

## 2018-10-17 DIAGNOSIS — L97522 Non-pressure chronic ulcer of other part of left foot with fat layer exposed: Secondary | ICD-10-CM | POA: Diagnosis not present

## 2018-10-17 DIAGNOSIS — Z794 Long term (current) use of insulin: Secondary | ICD-10-CM | POA: Diagnosis not present

## 2018-10-17 DIAGNOSIS — E1142 Type 2 diabetes mellitus with diabetic polyneuropathy: Secondary | ICD-10-CM | POA: Diagnosis not present

## 2018-10-17 NOTE — Telephone Encounter (Signed)
Spoke with patient on phone about lab results that were taken on 10/14/18.  Per note from   Rise Mu, PA-C  P Cv Div [place] Triage        Renal function continues to worsen. Hold Lasix and would only take prn. Potassium is slightly elevated as well. Please ensure he is not taking losartan. Increase water intake. Needs to see nephrology as previously noted. Recheck bmet late in the week of 12/2.    Called to relay information to pt. Per pt he is having inc SOB over the weekend. Has been unable to lay flat.  Pt reports that he received monitor in the mail but has not placed it yet. Pt instructed to call the number to get help with placing at home.   D/t inc in SOB and lab work needed, pt scheduled to come into the office for appt on 10/19/18 @ 2 pm. Pt verbalized understanding and was encouraged to call with any further questions or concerns.

## 2018-10-17 NOTE — Progress Notes (Signed)
Cardiology Office Note Date:  10/19/2018  Patient ID:  [name] [name], [name] [name] [birth year], MRN [id number] PCP:  Tracie Harrier, MD  Cardiologist:  Dr. Rockey Situ, MD    Chief Complaint: Hospital follow up  History of Present Illness: [name] [name] is a 57 y.o. male with history of CAD status post four-vessel CABG in 04/2014 with a LIMA to LAD, SVG to diagonal, SVG to OM, and SVG to PDA, HFrEF secondary to ICM, PVD, poorly controlled insulin-dependent diabetes with diabetic foot ulcers and diabetic peripheral neuropathy requiring amputations of the left toe/foot, osteomyelitis, CKD stage III with prior hyperkalemia, hypertension, hyperlipidemia, asymptomatic orthostatic hypotension, and medication compliance issues secondary to finances who presents for hospital follow-up after recent admission to [facility] from 11/19 through 11/22 for acute on chronic combined CHF complicated by acute on CKD.  Prior echo in 07/2013 showed an EF of 20%.  Stress testing undertaken at that time showed a fixed inferior and septal wall large defect without much reversibility which was felt to be due to possible prior silent MI in the LAD and RCA territory without ischemia.  This was a moderate risk scan.  Patient subsequently underwent cardiac catheterization in 03/2014 that showed 40% stenosis in the mid left main, 70% stenosis in the mid LAD, 70% stenosis in D2, 80% proximal LCx stenosis, 80% mid LCx stenosis, 99% proximal RCA stenosis, R PLA 99% stenosis.  EF was estimated to be 40%.  Following this, he proceeded to have four-vessel CABG as outlined above.  Follow-up echocardiogram in 05/2017 showed an improved EF to 40 to 45%, unable to exclude regional wall motion abnormalities, grade 1 diastolic dysfunction, mild mitral regurgitation, mildly dilated left atrium, RV systolic function normal, PASP normal.  Patient underwent repeat ischemic evaluation via nuclear stress test in 02/2018 showed a large defect of moderate severity  present in the basal inferior, basal inferolateral, mid inferior, mid inferolateral, and apical inferior location.  These findings were consistent with prior MI with mild peri-infarct ischemia.  EF was estimated at 30 to 44%.  Overall this was an intermediate risk study.  He was admitted to [facility] on 11/19 with worsening shortness of breath for the prior 2 months.  Patient had been treated with antibiotics by PCP 1 month prior without significant improvement in symptoms.  He was admitted to the hospital with worsening orthopnea, PND, and lower extremity swelling.  He reported dietary noncompliance.  Labs upon admission showed a BNP of 1,648, troponin minimally elevated and flat trending at 0.03, mild anemia with hemoglobin of 11.2, hemoglobin A1c 6.2, serum creatinine 2.11.  Chest x-ray showed mild pulmonary vascular congestion and interstitial edema with trace bilateral pleural effusions.  Lower extremity venous duplex was negative for evidence of DVT in the bilateral lower extremities.  He was diuresed with repeat chest x-ray showing interval decrease in the pulmonary vascularity consistent with improving CHF.  Echo performed on 10/05/2018 showed an EF of 30 to 35%, diffuse hypokinesis, grade 1 diastolic dysfunction, mild mitral regurgitation, mildly dilated left atrium, RV systolic function was normal, unable to accurately estimate PASP.  He was noted to have a left pleural effusion measuring 8 cm.  He underwent nuclear stress test on 10/07/2018 that showed a large region of fixed inferior and inferior lateral wall perfusion defect consistent with previous MI with very mild ischemia in the inferolateral wall.  Hypokinesis was noted of the inferior wall.  EF was estimated at 36%.  No EKG changes were concerning for ischemia at peak  stress or in recovery.  Overall this was a moderate risk scan with similar findings on study noted in 02/2018.  During his admission, he was noted to have short runs of NSVT which led to  the titration of his carvedilol to 25 mg twice daily on the day of discharge.  Outpatient cardiac monitoring was recommended which is pending at this time.  Patient's discharge weight was noted to be 116.5 kg which was improved from his admission weight of 124 kg.  Discharge cardiac medications included aspirin 81 mg, Lipitor 40 mg, carvedilol 25 mg twice daily, Lasix 40 mg daily, hydralazine 50 mg twice daily, Imdur 60 mg daily, losartan 50 mg daily, sublingual nitroglycerin 0.4 mg as needed, KCl 10 mEq daily.  On the day of discharge, he was noted to have an uptrending BUN and serum creatinine of 42/2.35 respectively.  Because of this, we recommended short-term outpatient follow-up of BMP which was performed on 11/25 and showed continued worsening of BUN/serum creatinine to 53/2.83.  In the setting, it was recommended he hold Lasix and losartan for 2 days followed by increase in his water intake.  Following that, it was recommended he restart Lasix at a lower dose of 20 mg daily.  It was also recommended that he be referred to nephrology.  Patient was seen by [facility] CH[facility] clinic on 11/27 with a blood pressure 153/79 and a weight of 119.9 kg.  No changes were made.  Recheck BMP on 11/29 showed a stable BUN of 53 though a worsening serum creatinine of 2.91.  It was recommended he hold Lasix and only take this as needed.  His potassium was also mildly elevated at 5.4 with recommendation to ensure he is no longer taking losartan.  Patient called our office on 10/16/2018 noting he has had progressive swelling, dyspnea, and orthopnea over the past several days in the setting of CHF and CKD necessitating the holding of Lasix as outlined above.  It was recommended he present to the ED for further evaluation.  It does not appear the patient followed through with that.  Patient's wife called again on 12/2 noting he was still having increased shortness of breath with associated orthopnea.  He had received his heart monitor  though had not yet placed it.  RN scheduled patient to be seen on 12/4.  He comes in today feeling poorly in the setting of rhinorrhea, nasal congestion, sinus pressure, and chest congestion.  He has noted a dry cough.  He initially thought these symptoms were in the setting of volume overload and had to continue to take Lasix 20 mg daily throughout all of the above however he now realizes this is in the setting of an upper respiratory infection and has not taken Lasix since 10/17/2018.  He feels like he is dehydrated at this point.  Patient's weight is down 13 pounds from his last office visit in 02/2018.  His weight is consistent with his discharge weight as noted above.  He denies any chest pain, lower extremity swelling, abdominal distention, PND, orthopnea, or early satiety.  Weight at home without close has been approximately 253 pounds.  He is scheduled to see nephrology on 12/6.  He reports with his above potential URI he has been afebrile and without myalgias, arthralgias, or chills.  He has yet to place his Zio monitor on.  Past Medical History:  Diagnosis Date  . CAD (coronary artery disease)    a. 04/2014 CABG x 4 (LIMA->LAD, VG->Diag, VG->OM, VG->PDA).  Marland Kitchen  Chronic systolic CHF (congestive heart failure) ([place])    a. 07/2014 Echo: EF 30-35%.  . CKD (chronic kidney disease), stage III ([place])    a. 12/2015 Creat 1.8.  . Diabetic foot ulcers ([place])    a. left foot 2nd digit ant 5 th digit 05/03/14; b. 08/2014 s/p amputation of toes on L foot.  . Hypercholesterolemia    a. 12/2015 TC 116, TG 154, HDL 24, LDL 61-->Atorvastatin 40.  Marland Kitchen Hypertension   . Hypertensive heart disease   . Ischemic cardiomyopathy    a. 07/2013 Echo: EF 20%; b. 07/2014 Echo: EF 30-35%, diff HK, Gr1 DD, mildly dil LA, nl PASP.  Marland Kitchen Myocardial infarction ([place])   . Neuropathy in diabetes ([place])   . Open wound    foot  . Orthostatic hypotension   . Peripheral vascular disease ([place])   . Type II diabetes mellitus ([place])    a.  12/2015 HbA1c = 13.9.    Past Surgical History:  Procedure Laterality Date  . ACHILLES TENDON SURGERY Right 01/22/2017   Procedure: ACHILLES LENGTHENING/KIDNER/TAL/Teno achilles lengthening;  Surgeon: Samara Deist, DPM;  Location: [facility] ORS;  Service: Podiatry;  Laterality: Right;  . CARDIAC CATHETERIZATION     03/2014  . CATARACT EXTRACTION    . CORONARY ARTERY BYPASS GRAFT N/A 05/07/2014   Procedure: CORONARY ARTERY BYPASS GRAFTING (CABG);  Surgeon: Gaye Pollack, MD;  Location: [place];  Service: Open Heart Surgery;  Laterality: N/A;  Times 4 using left internal mammary artery and endoscopically harvested right saphenous vein  . INTRAOPERATIVE TRANSESOPHAGEAL ECHOCARDIOGRAM N/A 05/07/2014   Procedure: INTRAOPERATIVE TRANSESOPHAGEAL ECHOCARDIOGRAM;  Surgeon: Gaye Pollack, MD;  Location: [facility] OR;  Service: Open Heart Surgery;  Laterality: N/A;  . left foot osteomyelitis and wound after surgery    . TOE AMPUTATION     Left 3 and 4 toes  . TONSILECTOMY/ADENOIDECTOMY WITH MYRINGOTOMY      Current Meds  Medication Sig  . aspirin EC 81 MG tablet Take 81 mg by mouth daily.  Marland Kitchen atorvastatin (LIPITOR) 40 MG tablet TAKE 1 TABLET EVERY DAY (Patient taking differently: Take 40 mg by mouth at bedtime. )  . carvedilol (COREG) 25 MG tablet Take 1 tablet (25 mg total) by mouth 2 (two) times daily with a meal.  . ferrous sulfate 325 (65 FE) MG tablet Take 325 mg by mouth daily.  . furosemide (LASIX) 20 MG tablet Take 1 tablet (20 mg total) by mouth daily.  Marland Kitchen glimepiride (AMARYL) 4 MG tablet Take 4 mg by mouth 2 (two) times daily.  . hydrALAZINE (APRESOLINE) 25 MG tablet Take 1 tablet (25 mg total) by mouth 2 (two) times daily.  . insulin aspart (NOVOLOG FLEXPEN) 100 UNIT/ML FlexPen Inject 0-16 Units into the skin 3 (three) times daily with meals. Sliding scale insulin  . isosorbide mononitrate (IMDUR) 60 MG 24 hr tablet Take 1 tablet (60 mg total) by mouth daily.  . nitroGLYCERIN (NITROSTAT) 0.4 MG SL tablet  Place 1 tablet (0.4 mg total) under the tongue every 5 (five) minutes as needed for chest pain.  Marland Kitchen zolpidem (AMBIEN) 5 MG tablet Take 5 mg by mouth at bedtime as needed for sleep.    Allergies:   Other   Social History:  The patient  reports that he has never smoked. He has never used smokeless tobacco. He reports that he does not drink alcohol or use drugs.   Family History:  The patient's family history includes Diabetes type II in his unknown relative;  Heart disease in his father; Hypertension in his unknown relative; Kidney disease in his unknown relative.  ROS:   Review of Systems  Constitutional: Positive for malaise/fatigue. Negative for chills, diaphoresis, fever and weight loss.  HENT: Positive for congestion and sinus pain.        Rhinorrhea, postnasal drip  Eyes: Negative for discharge and redness.  Respiratory: Positive for cough and shortness of breath. Negative for hemoptysis, sputum production and wheezing.   Cardiovascular: Negative for chest pain, palpitations, orthopnea, claudication, leg swelling and PND.  Gastrointestinal: Negative for abdominal pain, blood in stool, heartburn, melena, nausea and vomiting.  Genitourinary: Negative for hematuria.  Musculoskeletal: Negative for falls and myalgias.  Skin: Negative for rash.  Neurological: Positive for weakness. Negative for dizziness, tingling, tremors, sensory change, speech change, focal weakness and loss of consciousness.  Endo/Heme/Allergies: Does not bruise/bleed easily.  Psychiatric/Behavioral: Negative for substance abuse. The patient is not nervous/anxious.   All other systems reviewed and are negative.    PHYSICAL EXAM:  VS:  BP 130/70 (BP Location: Left Arm, Patient Position: Sitting, Cuff Size: Normal)   Pulse 81   Ht 6\' 1"  (1.854 m)   Wt 257 lb 8 oz (116.8 kg)   BMI 33.97 kg/m  BMI: Body mass index is 33.97 kg/m.  Physical Exam  Constitutional: He is oriented to person, place, and time. He appears  well-developed and well-nourished.  HENT:  Head: Normocephalic and atraumatic.  Eyes: Right eye exhibits no discharge. Left eye exhibits no discharge.  Neck: Normal range of motion. No JVD present.  Cardiovascular: Normal rate, regular rhythm, S1 normal, S2 normal and normal heart sounds. Exam reveals no distant heart sounds, no friction rub, no midsystolic click and no opening snap.  No murmur heard. Pulses:      Posterior tibial pulses are 2+ on the right side, and 2+ on the left side.  Pulmonary/Chest: Effort normal and breath sounds normal. No respiratory distress. He has no decreased breath sounds. He has no wheezes. He has no rales. He exhibits no tenderness.  Abdominal: Soft. He exhibits no distension. There is no tenderness.  Musculoskeletal: He exhibits no edema.  Neurological: He is alert and oriented to person, place, and time.  Skin: Skin is warm and dry. No cyanosis. Nails show no clubbing.  Psychiatric: He has a normal mood and affect. His speech is normal and behavior is normal. Judgment and thought content normal.     EKG:  Was ordered and interpreted by me today. Shows NSR, 81 bpm, prior anterior infarct, inferior Q waves, lateral T wave inversion  Recent Labs: 10/04/2018: B Natriuretic Peptide 1,648.0 10/05/2018: Hemoglobin 11.2; Platelets 202 10/14/2018: BUN 53; Creatinine, Ser 2.91; Potassium 5.4; Sodium 139  No results found for requested labs within last 8760 hours.   Estimated Creatinine Clearance: 38.4 mL/min (A) (by C-G formula based on SCr of 2.91 mg/dL (H)).   Wt Readings from Last 3 Encounters:  10/19/18 257 lb 8 oz (116.8 kg)  10/12/18 264 lb 6 oz (119.9 kg)  10/07/18 256 lb 12.8 oz (116.5 kg)    Orthostatic vital signs: Lying: 162/79, 81 bpm Sitting: 145/73, 82 bpm Standing: 120/68, 82 bpm Standing x3 minutes: 95/60, 81 bpm  Other studies reviewed: Additional studies/records reviewed today include: summarized above  ASSESSMENT AND  PLAN:  1. Chronic combined CHF: He does not appear volume overloaded at this time and in fact actually appears volume depleted on exam as well as with his orthostatic vital signs as above.  He has not taken Lasix since 12/2 and I agree with this.  We are checking a stat chest x-ray, BMP, CBC, and BNP today.  I have advised him to continue to hold Lasix until he hears from [place] regarding his follow-up labs.  He has also remained off potassium and losartan since 11/25.  Weights continue to trend downwards.  He remains on carvedilol 25 mg twice daily.  CHF education.  2. CAD status post CABG: No symptoms concerning for angina at this time.  Recent nuclear stress test essentially unchanged from prior study.  Continue aspirin, Lipitor, carvedilol, and Imdur.  Aggressive secondary prevention.  No plans for further ischemic evaluation at this time.  3. Orthostatic hypotension: Likely in the setting of dehydration with diuresis.  Lasix and losartan are currently held as above.  Recommend he adequately hydrate and change positions slowly.  4. Acute on CKD stage III: Likely in the setting of overdiuresis.  Patient has held Lasix for the past 2 days as outlined above.  Check BMP as above.  He has follow-up with nephrology on 12/6.  5. Hyperkalemia: Check stat BMP.  6. NSVT: He has yet to place his Zio monitor on.  He will have our office apply this following his chest x-ray.  7. Uncontrolled insulin-dependent diabetes with diabetic neuropathy and PVD with foot ulceration status post left toe/foot amputations and osteomyelitis: Diabetes is followed by PCP.  Recent LDL of 75 from 08/2018.  Remains on Lipitor.  8. Hyperlipidemia: LDL of 75 from 08/2018.  Continue Lipitor 40 mig daily until his above URI is improved at which time we will recommend to increase the Lipitor to 80 mg daily.  Goal LDL less than 70.  Recommend rechecking fasting lipid panel and liver function in approximately 8 weeks, following escalation  of Lipitor as above.  9. URI: Check CBC and chest x-ray as above.  He was given a one-time prescription for Tessalon Perles 200 mg 3 times daily as needed cough.  He has been advised to contact his PCP for follow-up of his URI.  Disposition: F/u with Dr. Rockey Situ or an APP in 1 month.  Current medicines are reviewed at length with the patient today.  The patient did not have any concerns regarding medicines.  Signed, Christell Faith, PA-C 10/19/2018 2:20 PM     [place] [address] [place], [place] [address] [phone]

## 2018-10-17 NOTE — Telephone Encounter (Signed)
Patient's wife Constance Holster calling  States he is not doing well, still having trouble with a cough - would like to speak with nurse  Has also received his heart monitor and was told by Dr. Rockey Situ to call and we set it up for him Please call to discuss - best number is Norma's at [phone]

## 2018-10-17 NOTE — Telephone Encounter (Signed)
The patient had received the ZIO in the mail. Please see other epic encounter.

## 2018-10-19 ENCOUNTER — Ambulatory Visit: Payer: Medicare HMO | Admitting: Physician Assistant

## 2018-10-19 ENCOUNTER — Encounter: Payer: Self-pay | Admitting: Physician Assistant

## 2018-10-19 ENCOUNTER — Ambulatory Visit
Admission: RE | Admit: 2018-10-19 | Discharge: 2018-10-19 | Disposition: A | Discharge status: Home / Self Care | Payer: Medicare HMO | Source: Ambulatory Visit | Attending: Physician Assistant | Admitting: Physician Assistant

## 2018-10-19 ENCOUNTER — Other Ambulatory Visit
Admission: RE | Admit: 2018-10-19 | Discharge: 2018-10-19 | Disposition: A | Discharge status: Home / Self Care | Payer: Medicare HMO | Source: Ambulatory Visit | Attending: Physician Assistant | Admitting: Physician Assistant

## 2018-10-19 VITALS — BP 130/70 | HR 81 | Temp 98.4°F | Ht 73.0 in | Wt 257.5 lb

## 2018-10-19 DIAGNOSIS — R05 Cough: Secondary | ICD-10-CM

## 2018-10-19 DIAGNOSIS — I951 Orthostatic hypotension: Secondary | ICD-10-CM | POA: Diagnosis not present

## 2018-10-19 DIAGNOSIS — R059 Cough, unspecified: Secondary | ICD-10-CM

## 2018-10-19 DIAGNOSIS — E785 Hyperlipidemia, unspecified: Secondary | ICD-10-CM

## 2018-10-19 DIAGNOSIS — I5022 Chronic systolic (congestive) heart failure: Secondary | ICD-10-CM | POA: Diagnosis not present

## 2018-10-19 DIAGNOSIS — I517 Cardiomegaly: Secondary | ICD-10-CM | POA: Diagnosis not present

## 2018-10-19 DIAGNOSIS — N183 Chronic kidney disease, stage 3 unspecified: Secondary | ICD-10-CM

## 2018-10-19 DIAGNOSIS — E875 Hyperkalemia: Secondary | ICD-10-CM | POA: Diagnosis not present

## 2018-10-19 DIAGNOSIS — R0989 Other specified symptoms and signs involving the circulatory and respiratory systems: Secondary | ICD-10-CM | POA: Insufficient documentation

## 2018-10-19 DIAGNOSIS — I2510 Atherosclerotic heart disease of native coronary artery without angina pectoris: Secondary | ICD-10-CM

## 2018-10-19 DIAGNOSIS — I255 Ischemic cardiomyopathy: Secondary | ICD-10-CM | POA: Insufficient documentation

## 2018-10-19 DIAGNOSIS — I4729 Other ventricular tachycardia: Secondary | ICD-10-CM

## 2018-10-19 DIAGNOSIS — N189 Chronic kidney disease, unspecified: Secondary | ICD-10-CM

## 2018-10-19 DIAGNOSIS — I472 Ventricular tachycardia: Secondary | ICD-10-CM | POA: Diagnosis not present

## 2018-10-19 DIAGNOSIS — J069 Acute upper respiratory infection, unspecified: Secondary | ICD-10-CM

## 2018-10-19 DIAGNOSIS — N179 Acute kidney failure, unspecified: Secondary | ICD-10-CM | POA: Diagnosis not present

## 2018-10-19 DIAGNOSIS — E118 Type 2 diabetes mellitus with unspecified complications: Secondary | ICD-10-CM

## 2018-10-19 LAB — CBC WITH DIFFERENTIAL/PLATELET
ABS IMMATURE GRANULOCYTES: 0.02 10*3/uL (ref 0.00–0.07)
Basophils Absolute: 0 10*3/uL (ref 0.0–0.1)
Basophils Relative: 0 %
EOS ABS: 0.2 10*3/uL (ref 0.0–0.5)
EOS PCT: 3 %
HCT: 35.1 % — ABNORMAL LOW (ref 39.0–52.0)
Hemoglobin: 11.2 g/dL — ABNORMAL LOW (ref 13.0–17.0)
Immature Granulocytes: 0 %
Lymphocytes Relative: 14 %
Lymphs Abs: 0.8 10*3/uL (ref 0.7–4.0)
MCH: 29.7 pg (ref 26.0–34.0)
MCHC: 31.9 g/dL (ref 30.0–36.0)
MCV: 93.1 fL (ref 80.0–100.0)
MONO ABS: 0.6 10*3/uL (ref 0.1–1.0)
MONOS PCT: 11 %
NEUTROS ABS: 4.2 10*3/uL (ref 1.7–7.7)
Neutrophils Relative %: 72 %
PLATELETS: 223 10*3/uL (ref 150–400)
RBC: 3.77 MIL/uL — AB (ref 4.22–5.81)
RDW: 13.2 % (ref 11.5–15.5)
WBC: 5.8 10*3/uL (ref 4.0–10.5)
nRBC: 0 % (ref 0.0–0.2)

## 2018-10-19 LAB — BASIC METABOLIC PANEL
Anion gap: 10 (ref 5–15)
BUN: 47 mg/dL — AB (ref 6–20)
CALCIUM: 8.8 mg/dL — AB (ref 8.9–10.3)
CO2: 22 mmol/L (ref 22–32)
CREATININE: 2.92 mg/dL — AB (ref 0.61–1.24)
Chloride: 104 mmol/L (ref 98–111)
GFR calc Af Amer: 27 mL/min — ABNORMAL LOW (ref >60)
GFR calc non Af Amer: 23 mL/min — ABNORMAL LOW (ref >60)
GLUCOSE: 143 mg/dL — AB (ref 70–99)
POTASSIUM: 5.1 mmol/L (ref 3.5–5.1)
SODIUM: 136 mmol/L (ref 135–145)

## 2018-10-19 LAB — BRAIN NATRIURETIC PEPTIDE: B NATRIURETIC PEPTIDE 5: 716 pg/mL — AB (ref 0.0–100.0)

## 2018-10-19 MED ORDER — BENZONATATE 200 MG PO CAPS: 200.0000 mg | ORAL_CAPSULE | Freq: Three times a day (TID) | ORAL | 0 refills | Status: DC | PRN

## 2018-10-19 NOTE — Patient Instructions (Signed)
Medication Instructions:  - Your physician has recommended you make the following change in your medication:   1) Start tessalon perls 200 mg- take 1 capsule (200 mg) by mouth three times a day as needed for cough  If you need a refill on your cardiac medications before your next appointment, please call your pharmacy.   Lab work: - Your physician recommends that you lab work today: BMP/ BNP/ CBC- please go to the [employer] at [facility], 1st desk on the right to check in  If you have labs (blood work) drawn today and your tests are completely normal, you will receive your results only by: Marland Kitchen MyChart Message (if you have MyChart) OR . A paper copy in the mail If you have any lab test that is abnormal or we need to change your treatment, we will call you to review the results.  Testing/Procedures: - A chest x-ray (today- [facility]) takes a picture of the organs and structures inside the chest, including the heart, lungs, and blood vessels. This test can show several things, including, whether the heart is enlarges; whether fluid is building up in the lungs; and whether pacemaker / defibrillator leads are still in place.  Follow-Up: At [facility], you and your health needs are our priority.  As part of our continuing mission to provide you with exceptional heart care, we have created designated Provider Care Teams.  These Care Teams include your primary Cardiologist (physician) and Advanced Practice Providers (APPs -  Physician Assistants and Nurse Practitioners) who all work together to provide you with the care you need, when you need it. . in 1 month with Dr. Louisa Second, PA  Any Other Special Instructions Will Be Listed Below (If Applicable). - N/A

## 2018-10-21 ENCOUNTER — Telehealth: Payer: Self-pay | Admitting: Physician Assistant

## 2018-10-21 DIAGNOSIS — E875 Hyperkalemia: Secondary | ICD-10-CM | POA: Diagnosis not present

## 2018-10-21 DIAGNOSIS — N184 Chronic kidney disease, stage 4 (severe): Secondary | ICD-10-CM | POA: Diagnosis not present

## 2018-10-21 DIAGNOSIS — E1129 Type 2 diabetes mellitus with other diabetic kidney complication: Secondary | ICD-10-CM | POA: Diagnosis not present

## 2018-10-21 DIAGNOSIS — I129 Hypertensive chronic kidney disease with stage 1 through stage 4 chronic kidney disease, or unspecified chronic kidney disease: Secondary | ICD-10-CM | POA: Diagnosis not present

## 2018-10-21 NOTE — Telephone Encounter (Signed)
I would agree with nephrology as that is what I had previously recommended. He should take Lasix prn for weight gain > 3 pounds overnight, > 5 pounds in a 1 week time span, increased lower extremity swelling, or worsening dyspnea. He should follow up with his PCP regarding his potential URI as this is likely exacerbating his symptoms.   Given his persistent dyspnea, in the setting of a slightly worsening EF as noted during his admission in 09/2018, we need to consider repeat ischemic evaluation. I know he underwent nuclear stress testing at that time that was consistent with prior study in 02/2018.   We should see him back next week to assess how he is feeling and talk about possible R/LHC (if renal function allows for [facility]). At a minimum we can pursue RHC to better assess his volume status.

## 2018-10-21 NOTE — Telephone Encounter (Signed)
The patient is aware of his lab/ chest x-ray results. He verbalizes understanding of Ryan, PA's recommendations to: - hold losartan, potassium, & furosemide until seen by nephrology today  Per the patient, he did see the nephrologist already this morning.  The patient states that nephrology had advised to take his lasix on an as needed basis.  I advised the patient I will notify Thurmond Butts of nephrology recommendations and call him back if he has anything to add to this. The patient is aware to continue to monitor his BP & daily weights and call us back if he has a 3 lb/ > weight gain in 24 hours or 5 lbs in 1 week.  The patient is agreeable with the above and voices understanding.

## 2018-10-21 NOTE — Telephone Encounter (Signed)
BMP/ CBC results: Notes recorded by Rise Mu, PA-C on 10/19/2018 at 4:38 PM EST Please call patient. We continue to await BNP. Renal function remains elevated though is essentially stable when compared to value drawn on 11/29. Potassium is improved and now high-end of normal. White blood cell count is normal. Hemoglobin remains low though stable.  I would like for him to continue to hold torsemide, potassium, and losartan at this time. He should keep his appointment with nephrology on 12/6. As needed, we can slowly reintroduce his torsemide and losartan based on his blood pressure and renal function. We will await his chest x-ray and BNP.  BNP results: Notes recorded by Rise Mu, PA-C on 10/19/2018 at 4:42 PM EST Please see prior result as well. BNP is elevated, though significantly improved when compared to value 2 weeks prior. Await CXR.  Chest X-ray results: Notes recorded by Rise Mu, PA-C on 10/20/2018 at 10:42 AM EST CXR showed slight improvement in his vascular congestion (fluid overload) with stable enlarged heart. No effusion noted. Given continued elevation in his renal function, we will need to continue to hold his diuretic until he sees the nephrologist on 12/6.

## 2018-10-24 NOTE — Telephone Encounter (Signed)
Call to patient to relay recommendations from Christell Faith, [place] as follows,   "I would agree with nephrology as that is what I had previously recommended. He should take Lasix prn for weight gain > 3 pounds overnight, > 5 pounds in a 1 week time span, increased lower extremity swelling, or worsening dyspnea. He should follow up with his PCP regarding his potential URI as this is likely exacerbating his symptoms.   Given his persistent dyspnea, in the setting of a slightly worsening EF as noted during his admission in 09/2018, we need to consider repeat ischemic evaluation. I know he underwent nuclear stress testing at that time that was consistent with prior study in 02/2018.   We should see him back next week to assess how he is feeling and talk about possible R/LHC (if renal function allows for [facility]). At a minimum we can pursue RHC to better assess his volume status."  Pt reports that he did have a URI but is feeling better now and denies SOB.  Pt understands POC but has f/u scheduled for 11/23/2018 and refuses to come in sooner at this time. "they wont even have the report back from the monitor that I am wearing."   Advised pt to call for any further questions or concerns

## 2018-10-25 ENCOUNTER — Ambulatory Visit (INDEPENDENT_AMBULATORY_CARE_PROVIDER_SITE_OTHER): Payer: Medicare HMO

## 2018-10-25 ENCOUNTER — Other Ambulatory Visit: Payer: Self-pay | Admitting: *Deleted

## 2018-10-25 DIAGNOSIS — I4729 Other ventricular tachycardia: Secondary | ICD-10-CM

## 2018-10-25 DIAGNOSIS — I472 Ventricular tachycardia: Secondary | ICD-10-CM

## 2018-10-26 ENCOUNTER — Ambulatory Visit: Payer: Medicare HMO | Admitting: Cardiovascular Disease

## 2018-10-26 ENCOUNTER — Telehealth: Payer: Self-pay | Admitting: Physician Assistant

## 2018-10-26 NOTE — Telephone Encounter (Signed)
Yes, let us wait and see what this initial monitor showed.

## 2018-10-26 NOTE — Telephone Encounter (Signed)
Ryan,  Just an [place]. Do you think we should wait to see what it shows so far prior to ordering another one?  Thanks!

## 2018-10-26 NOTE — Telephone Encounter (Signed)
Calling to let us to know pt states monitor "fell off" and is sending it back. Patient does have 5 days of data Reference # [id number]

## 2018-10-31 DIAGNOSIS — L97512 Non-pressure chronic ulcer of other part of right foot with fat layer exposed: Secondary | ICD-10-CM | POA: Diagnosis not present

## 2018-11-17 ENCOUNTER — Telehealth: Payer: Self-pay

## 2018-11-17 DIAGNOSIS — I5022 Chronic systolic (congestive) heart failure: Secondary | ICD-10-CM

## 2018-11-17 NOTE — Telephone Encounter (Signed)
-----   Message from Minna Merritts, MD sent at 11/13/2018 12:07 PM EST ----- He continues to have runs of VT on monitor In the setting of EF 35% Prior MI, large fixed nferior wall defect on recent stress test underlying CAD, Would recommend consultation with EP for consideration of options: Medical management, ?ICD

## 2018-11-17 NOTE — Telephone Encounter (Signed)
Call to pt to discuss results of event monitor and suggestions from Dr. Rockey Situ.  Pt verbalized understanding, has f/u next week that he will report to.   Referral placed to EP per [facility] request.   Advised pt to call for any further questions or concerns

## 2018-11-22 DIAGNOSIS — I4729 Other ventricular tachycardia: Secondary | ICD-10-CM | POA: Insufficient documentation

## 2018-11-22 DIAGNOSIS — I472 Ventricular tachycardia: Secondary | ICD-10-CM | POA: Insufficient documentation

## 2018-11-22 NOTE — Progress Notes (Signed)
Cardiology Office Note  Date:  11/23/2018   ID:  [name] [name], DOB September 24, 1961, MRN [id number]  PCP:  [name] Harrier, MD   Chief Complaint  Patient presents with  . other    f/u ED CHF [facility],ECHO, ABI. Medications reviewed verbally.     HPI:  [name] [name] is a 58 year old gentleman with history of  diabetes, poorly controlled with poor diet, finances limiting medication compliance hyperlipidemia,  coronary artery disease with bypass surgery x4 at the end of June 2015 with Dr. Cyndia Bent, orthostatic hypotension  EF at first <20%, 05/2017: EF 40 to 45% Hospitalization November for acute on chronic systolic CHF ejection fraction 30 to 35%, nonsustained VT on monitors on who presents for routine follow-up Of his coronary artery disease, history of bypass  Discussed recent events with him [facility] on 11/19  shortness of breath, upper respiratory infection and systolic CHF with acute on chronic renal failure treated with antibiotics by PCP without improvement worsening orthopnea, PND, and lower extremity swelling.   dietary noncompliance.  BNP of 1,648,serum creatinine 2.11.   Chest x-ray showed mild pulmonary vascular congestion and interstitial edema  Diuresed  Echo performed on 10/05/2018 showed an EF of 30 to 35%, diffuse hypokinesis, left pleural effusion measuring 8 cm.    nuclear stress test on 10/07/2018 that showed a large region of fixed inferior and inferior lateral wall perfusion defect consistent with previous MI   EF was estimated at 36%.   short runs of NSVT in the hospital titration of his carvedilol to 25 mg twice daily Imdur up to 60 daily and started on hydralazine   Outpatient cardiac monitoring  ----8 Ventricular Tachycardia runs occurred, the run with the fastest interval lasting 4 beats with a max rate of 200 bpm,  the longest lasting 17 beats with an avg rate of 120 bpm.  ----7 Supraventricular Tachycardia runs occurred,  the run with the fastest interval  lasting 5 beats with a max rate of 148 bpm,  the longest lasting 13 beats with an avg rate of 110 bpm.    referred to nephrology.    [place] [facility] on 11/27  called our office on 10/16/2018 progressive swelling, dyspnea, and orthopnea  present to the ED for further evaluation. wife called again on 12/2 noting he was still having increased shortness of breath with associated orthopnea.    Event Monitor Normal sinus rhythm avg HR of 81 bpm. ---8 Ventricular Tachycardia runs occurred, the run with the fastest interval lasting 4 beats with a max rate of 200 bpm,  the longest lasting 17 beats with an avg rate of 120 bpm.  ----7 Supraventricular Tachycardia runs occurred,  the run with the fastest interval lasting 5 beats with a max rate of 148 bpm,  the longest lasting 13 beats with an avg rate of 110 bpm.   Long history of medication noncompliance secondary to financial problems This morning reports he has not taken his medications Typically takes them 10 AM 10 PM He is not taking the doses prescribed at discharge higher dose carvedilol and hydralazine with higher dose isosorbide  Systolic pressure this morning 180 on my check if not higher Reports he is asymptomatic Long history of asymptomatic orthostasis In the past office visit 160 down to 130 with standing no improvement after 3 minutes  Prior hemoglobin A1c 12.9 now down to 7 in October 2019 But he ran out of his medications at that time insulin was too expensive  Creatinine typically runs 2.1  EKG personally reviewed by myself on todays visit Shows normal sinus rhythm consider old anterior MI, old inferior MI  Other past medical history reviewed  history in October 2014 of MRSA, requiring amputation of several of his toes, severe renal dysfunction at that time felt secondary to antibiotics with subsequent improvement of his renal function. Prior echocardiogram September 2014 showing ejection fraction less than  20% ejection fraction was 30-35% by echo 08/02/2014  cardiac catheterization may 2015 showing 70% left main disease, 70% diagonal #2 disease, 80% proximal circumflex disease, 80% mid circumflex disease, 99% proximal RCA disease, 99% PL branch disease, ejection fraction 40%    PMH:   has a past medical history of CAD (coronary artery disease), Chronic systolic CHF (congestive heart failure) ([place]), CKD (chronic kidney disease), stage III ([place]), Diabetic foot ulcers ([place]), Hypercholesterolemia, Hypertension, Hypertensive heart disease, Ischemic cardiomyopathy, Myocardial infarction ([place]), Neuropathy in diabetes ([place]), Open wound, Orthostatic hypotension, Peripheral vascular disease ([place]), and Type II diabetes mellitus ([place]).  PSH:    Past Surgical History:  Procedure Laterality Date  . ACHILLES TENDON SURGERY Right 01/22/2017   Procedure: ACHILLES LENGTHENING/KIDNER/TAL/Teno achilles lengthening;  Surgeon: Samara Deist, DPM;  Location: [facility] ORS;  Service: Podiatry;  Laterality: Right;  . CARDIAC CATHETERIZATION     03/2014  . CATARACT EXTRACTION    . CORONARY ARTERY BYPASS GRAFT N/A 05/07/2014   Procedure: CORONARY ARTERY BYPASS GRAFTING (CABG);  Surgeon: Gaye Pollack, MD;  Location: [place];  Service: Open Heart Surgery;  Laterality: N/A;  Times 4 using left internal mammary artery and endoscopically harvested right saphenous vein  . INTRAOPERATIVE TRANSESOPHAGEAL ECHOCARDIOGRAM N/A 05/07/2014   Procedure: INTRAOPERATIVE TRANSESOPHAGEAL ECHOCARDIOGRAM;  Surgeon: Gaye Pollack, MD;  Location[facility];  Service: Open Heart Surgery;  Laterality: N/A;  . left foot osteomyelitis and wound after surgery    . TOE AMPUTATION     Left 3 and 4 toes  . TONSILECTOMY/ADENOIDECTOMY WITH MYRINGOTOMY      Current Outpatient Medications  Medication Sig Dispense Refill  . aspirin EC 81 MG tablet Take 81 mg by mouth daily.    Marland Kitchen atorvastatin (LIPITOR) 40 MG tablet Take 1 tablet (40 mg total) by mouth at bedtime.  90 tablet 3  . carvedilol (COREG) 25 MG tablet Take 1 tablet (25 mg total) by mouth 2 (two) times daily with a meal. 180 tablet 3  . ferrous sulfate 325 (65 FE) MG tablet Take 325 mg by mouth daily.    . furosemide (LASIX) 20 MG tablet Take 1 tablet (20 mg total) by mouth daily. 90 tablet 2  . glimepiride (AMARYL) 4 MG tablet Take 4 mg by mouth 2 (two) times daily.    . hydrALAZINE (APRESOLINE) 25 MG tablet Take 1 tablet (25 mg total) by mouth 3 (three) times daily. 270 tablet 3  . insulin aspart (NOVOLOG FLEXPEN) 100 UNIT/ML FlexPen Inject 0-16 Units into the skin 3 (three) times daily with meals. Sliding scale insulin    . isosorbide mononitrate (IMDUR) 60 MG 24 hr tablet Take 1 tablet (60 mg total) by mouth daily. 90 tablet 3  . nitroGLYCERIN (NITROSTAT) 0.4 MG SL tablet Place 1 tablet (0.4 mg total) under the tongue every 5 (five) minutes as needed for chest pain. 30 tablet 1   No current facility-administered medications for this visit.      Allergies:   Other   Social History:  The patient  reports that he has never smoked. He has never used smokeless tobacco. He reports  that he does not drink alcohol or use drugs.   Family History:   family history includes Diabetes type II in his unknown relative; Heart disease in his father; Hypertension in his unknown relative; Kidney disease in his unknown relative.    Review of Systems: Review of Systems  Constitutional: Negative.   Respiratory: Negative.   Cardiovascular: Negative.   Gastrointestinal: Negative.   Musculoskeletal: Negative.   Skin:       Nonhealing ulcer on his foot  Neurological: Negative.   Psychiatric/Behavioral: Negative.   All other systems reviewed and are negative.    PHYSICAL EXAM: VS:  BP (!) 160/88 (BP Location: Left Arm, Patient Position: Sitting, Cuff Size: Normal)   Pulse 77   Ht 6\' 1"  (1.854 m)   Wt 261 lb (118.4 kg)   BMI 34.43 kg/m  , BMI Body mass index is 34.43 kg/m. Constitutional:  oriented  to person, place, and time. No distress.  HENT:  Head: Grossly normal Eyes:  no discharge. No scleral icterus.  Neck: No JVD, no carotid bruits  Cardiovascular: Regular rate and rhythm, no murmurs appreciated Pulmonary/Chest: Clear to auscultation bilaterally, no wheezes or rails Abdominal: Soft.  no distension.  no tenderness.  Musculoskeletal: Normal range of motion Neurological:  normal muscle tone. Coordination normal. No atrophy Skin: Skin warm and dry Psychiatric: normal affect, pleasant  Recent Labs: 10/19/2018: B Natriuretic Peptide 716.0; BUN 47; Creatinine, Ser 2.92; Hemoglobin 11.2; Platelets 223; Potassium 5.1; Sodium 136    Lipid Panel No results found for: CHOL, HDL, LDLCALC, TRIG    Wt Readings from Last 3 Encounters:  11/23/18 261 lb (118.4 kg)  10/19/18 257 lb 8 oz (116.8 kg)  10/12/18 264 lb 6 oz (119.9 kg)       ASSESSMENT AND PLAN:  Coronary artery disease involving native coronary artery of native heart without angina pectoris -  Stress test with large region of ischemia inferior inferolateral Stable anginal symptoms  Chronic systolic CHF (congestive heart failure) ([place]) - Appears relatively euvolemic on today's visit Ejection fraction 30 to 35% Stressed importance of getting his blood pressure under control which is greater than 259 systolic this morning --Coreg up to 25 twice daily, sent in his hydralazine which he does not have, isosorbide up to 60 daily.  This was on his discharge paperwork.  He is taking half of these doses  Ischemic cardiomyopathy - With some nonsustained VT on telemetry in the hospital and outpatient monitoring in the setting of large region of scar and ejection fraction 35% or less -We will refer to EP, he does not want earlier appointment currently has appointment March 2020 with Dr. Caryl Comes We did discuss risk of VT, sudden death.  " If I get going with my exercises I should be fine"  Orthostatic hypotension Losartan on hold  given hyperkalemia and renal dysfunction Of medication increased as above given markedly elevated pressures He will monitor orthostatics at home  Hypertensive heart disease with heart failure ([place]) Blood pressure poorly controlled on today's visit has not taken his morning medications which he typically takes at 10 AM Systolic pressure greater than 180 on my check today.  Discussed with him in detail, stressed the urgency of controlling his blood pressure  Hypercholesterolemia Would prefer to have more aggressive numbers but he is declining any changes to his Lipitor 40 He does not want Lipitor 80 or Zetia added  Type 2 diabetes mellitus with complication, with long-term current use of insulin ([place]) Long discussion concerning his  diabetes Long history of dietary noncompliance, inability to afford his medications Hemoglobin A1c dangerously high 13 in the past  S/P CABG x 4 Stressed importance of more aggressive diabetes control Recent ischemic work-up with stress test  Stage 3 chronic kidney disease  work aggressively on his diabetes Avoid NSAIDs, follow-up with nephrology Losartan on hold given high potassium Stressed importance in better blood pressure control  Chronic osteomyelitis of toe of left foot ([place]) amputation left foot, previous ulcer right foot   poorly controlled diabetes   Total encounter time more than 45 minutes  Greater than 50% was spent in counseling and coordination of care with the patient  Disposition:   F/U  6 months He will see Dr. Caryl Comes in March   Orders Placed This Encounter  Procedures  . EKG 12-Lead     Signed, Esmond Plants, M.D., Ph.D. 11/23/2018  [place], [place]

## 2018-11-23 ENCOUNTER — Encounter: Payer: Self-pay | Admitting: Pharmacist

## 2018-11-23 ENCOUNTER — Encounter: Payer: Self-pay | Admitting: Family

## 2018-11-23 ENCOUNTER — Encounter: Payer: Self-pay | Admitting: Cardiovascular Disease

## 2018-11-23 ENCOUNTER — Ambulatory Visit: Payer: Medicare HMO | Attending: Family | Admitting: Family

## 2018-11-23 ENCOUNTER — Ambulatory Visit: Payer: Medicare HMO | Admitting: Cardiovascular Disease

## 2018-11-23 VITALS — BP 160/88 | HR 77 | Ht 73.0 in | Wt 261.0 lb

## 2018-11-23 VITALS — BP 154/68 | HR 79 | Resp 18 | Ht 73.0 in | Wt 263.1 lb

## 2018-11-23 DIAGNOSIS — Z833 Family history of diabetes mellitus: Secondary | ICD-10-CM | POA: Diagnosis not present

## 2018-11-23 DIAGNOSIS — I472 Ventricular tachycardia: Secondary | ICD-10-CM

## 2018-11-23 DIAGNOSIS — I25118 Atherosclerotic heart disease of native coronary artery with other forms of angina pectoris: Secondary | ICD-10-CM | POA: Diagnosis not present

## 2018-11-23 DIAGNOSIS — Z7982 Long term (current) use of aspirin: Secondary | ICD-10-CM | POA: Diagnosis not present

## 2018-11-23 DIAGNOSIS — E118 Type 2 diabetes mellitus with unspecified complications: Secondary | ICD-10-CM

## 2018-11-23 DIAGNOSIS — E1122 Type 2 diabetes mellitus with diabetic chronic kidney disease: Secondary | ICD-10-CM | POA: Insufficient documentation

## 2018-11-23 DIAGNOSIS — E785 Hyperlipidemia, unspecified: Secondary | ICD-10-CM | POA: Insufficient documentation

## 2018-11-23 DIAGNOSIS — I130 Hypertensive heart and chronic kidney disease with heart failure and stage 1 through stage 4 chronic kidney disease, or unspecified chronic kidney disease: Secondary | ICD-10-CM | POA: Insufficient documentation

## 2018-11-23 DIAGNOSIS — Z881 Allergy status to other antibiotic agents status: Secondary | ICD-10-CM | POA: Diagnosis not present

## 2018-11-23 DIAGNOSIS — I255 Ischemic cardiomyopathy: Secondary | ICD-10-CM

## 2018-11-23 DIAGNOSIS — Z89422 Acquired absence of other left toe(s): Secondary | ICD-10-CM | POA: Diagnosis not present

## 2018-11-23 DIAGNOSIS — I5022 Chronic systolic (congestive) heart failure: Secondary | ICD-10-CM | POA: Diagnosis not present

## 2018-11-23 DIAGNOSIS — N183 Chronic kidney disease, stage 3 unspecified: Secondary | ICD-10-CM

## 2018-11-23 DIAGNOSIS — Z794 Long term (current) use of insulin: Secondary | ICD-10-CM | POA: Insufficient documentation

## 2018-11-23 DIAGNOSIS — Z951 Presence of aortocoronary bypass graft: Secondary | ICD-10-CM

## 2018-11-23 DIAGNOSIS — Z79899 Other long term (current) drug therapy: Secondary | ICD-10-CM | POA: Diagnosis not present

## 2018-11-23 DIAGNOSIS — I252 Old myocardial infarction: Secondary | ICD-10-CM | POA: Insufficient documentation

## 2018-11-23 DIAGNOSIS — E11621 Type 2 diabetes mellitus with foot ulcer: Secondary | ICD-10-CM | POA: Diagnosis not present

## 2018-11-23 DIAGNOSIS — I2510 Atherosclerotic heart disease of native coronary artery without angina pectoris: Secondary | ICD-10-CM | POA: Diagnosis not present

## 2018-11-23 DIAGNOSIS — Z8249 Family history of ischemic heart disease and other diseases of the circulatory system: Secondary | ICD-10-CM | POA: Diagnosis not present

## 2018-11-23 DIAGNOSIS — E7800 Pure hypercholesterolemia, unspecified: Secondary | ICD-10-CM | POA: Diagnosis not present

## 2018-11-23 DIAGNOSIS — E1140 Type 2 diabetes mellitus with diabetic neuropathy, unspecified: Secondary | ICD-10-CM | POA: Insufficient documentation

## 2018-11-23 DIAGNOSIS — I4729 Other ventricular tachycardia: Secondary | ICD-10-CM

## 2018-11-23 DIAGNOSIS — I10 Essential (primary) hypertension: Secondary | ICD-10-CM

## 2018-11-23 MED ORDER — ISOSORBIDE MONONITRATE ER 60 MG PO TB24: 60.0000 mg | ORAL_TABLET | Freq: Every day | ORAL | 3 refills | Status: DC

## 2018-11-23 MED ORDER — ATORVASTATIN CALCIUM 40 MG PO TABS: 40.0000 mg | ORAL_TABLET | Freq: Every day | ORAL | 3 refills | Status: DC

## 2018-11-23 MED ORDER — HYDRALAZINE HCL 25 MG PO TABS: 25.0000 mg | ORAL_TABLET | Freq: Three times a day (TID) | ORAL | 3 refills | Status: DC

## 2018-11-23 MED ORDER — CARVEDILOL 25 MG PO TABS: 25.0000 mg | ORAL_TABLET | Freq: Two times a day (BID) | ORAL | 3 refills | Status: DC

## 2018-11-23 NOTE — Progress Notes (Signed)
[place] - PHARMACIST COUNSELING NOTE   ASSESSMENT   (Brief summary of the presentation/problems identified):   Adherence assessment   Patient is using timing as an adherence strategy. Takes everything at 10 AM and 10 PM. He sometimes skips his carvedilol due to low blood pressure/dizziness.     Do you ever forget to take your medication? [] Yes (1) [x] No (0)  Do you ever skip doses due to side effects? [x] Yes (1) [] No (0)  Do you have trouble affording your medicines? [] Yes (1) [x] No (0)  Are you ever unable to pick up your medication due to transportation difficulties? [] Yes (1) [x] No (0)  Do you ever stop taking your medications because you don't believe they are helping? [] Yes (1) [x] No (0)  Total score _1______      Guideline-Directed Medical Therapy/Evidence Based Medicine   ACE/ARB/ARNI: No - CKD. Recently stopped scheduled diuretic and switched to PRN. Has had issues with hyperkalemia. Dr. Rockey Situ stopped losartan November 2019 d/t kidney dysfunction.    Beta Blocker: Yes   Aldosterone Antagonist: No Diuretic: Yes   Drug-related problem 1: none identified. Patient was skipping Coreg doses when it was increased but he reports not having problems with it now and he is able to take all his doses.   PLAN   DRP1: Continue current therapy.   SUBJECTIVE   HPI: [name] is a very pleasant 58 year old gentleman who presents to [facility] for routine follow up. He reports no issues to me.    Past Medical History:  Diagnosis Date  . CAD (coronary artery disease)    a. 04/2014 CABG x 4 (LIMA->LAD, VG->Diag, VG->OM, VG->PDA).  . Chronic systolic CHF (congestive heart failure) ([place])    a. 07/2014 Echo: EF 30-35%.  . CKD (chronic kidney disease), stage III ([place])    a. 12/2015 Creat 1.8.  . Diabetic foot ulcers ([place])    a. left foot 2nd digit ant 5 th digit 05/03/14; b. 08/2014 s/p amputation of toes on L foot.  . Hypercholesterolemia     a. 12/2015 TC 116, TG 154, HDL 24, LDL 61-->Atorvastatin 40.  Marland Kitchen Hypertension   . Hypertensive heart disease   . Ischemic cardiomyopathy    a. 07/2013 Echo: EF 20%; b. 07/2014 Echo: EF 30-35%, diff HK, Gr1 DD, mildly dil LA, nl PASP.  Marland Kitchen Myocardial infarction ([place])   . Neuropathy in diabetes ([place])   . Open wound    foot  . Orthostatic hypotension   . Peripheral vascular disease ([place])   . Type II diabetes mellitus ([place])    a. 12/2015 HbA1c = 13.9.      Current Outpatient Medications:  .  aspirin EC 81 MG tablet, Take 81 mg by mouth daily., Disp: , Rfl:  .  atorvastatin (LIPITOR) 40 MG tablet, Take 1 tablet (40 mg total) by mouth at bedtime., Disp: 90 tablet, Rfl: 3 .  carvedilol (COREG) 25 MG tablet, Take 1 tablet (25 mg total) by mouth 2 (two) times daily with a meal., Disp: 180 tablet, Rfl: 3 .  ferrous sulfate 325 (65 FE) MG tablet, Take 325 mg by mouth daily., Disp: , Rfl:  .  furosemide (LASIX) 20 MG tablet, Take 1 tablet (20 mg total) by mouth daily., Disp: 90 tablet, Rfl: 2 .  glimepiride (AMARYL) 4 MG tablet, Take 4 mg by mouth 2 (two) times daily., Disp: , Rfl:  .  hydrALAZINE (APRESOLINE) 25 MG tablet, Take 1 tablet (25 mg total)  by mouth 3 (three) times daily., Disp: 270 tablet, Rfl: 3 .  insulin aspart (NOVOLOG FLEXPEN) 100 UNIT/ML FlexPen, Inject 0-16 Units into the skin 3 (three) times daily with meals. Sliding scale insulin, Disp: , Rfl:  .  isosorbide mononitrate (IMDUR) 60 MG 24 hr tablet, Take 1 tablet (60 mg total) by mouth daily., Disp: 90 tablet, Rfl: 3 .  nitroGLYCERIN (NITROSTAT) 0.4 MG SL tablet, Place 1 tablet (0.4 mg total) under the tongue every 5 (five) minutes as needed for chest pain., Disp: 30 tablet, Rfl: 1    OBJECTIVE    BMP Latest Ref Rng & Units 10/19/2018 10/14/2018 10/10/2018  Glucose 70 - 99 mg/dL 143(H) 167(H) 106(H)  BUN 6 - 20 mg/dL 47(H) 53(H) 53(H)  Creatinine 0.61 - 1.24 mg/dL 2.92(H) 2.91(H) 2.83(H)  BUN/Creat Ratio 9 - 20 - - -  Sodium 135  - 145 mmol/L 136 139 140  Potassium 3.5 - 5.1 mmol/L 5.1 5.4(H) 3.8  Chloride 98 - 111 mmol/L 104 109 108  CO2 22 - 32 mmol/L 22 21(L) 23  Calcium 8.9 - 10.3 mg/dL 8.8(L) 8.2(L) 8.0(L)    Vital signs: HR 79, BP 154/68, weight 263.2 lb  ECHO: Date 10/05/2018, EF 30 to 35%, diffuse hypokinesis, mild MV regurg, consistent with grade 1 diastolic dysfunction  Cath: Date 04/03/2014, EF 40%, three vessel disease noted, CABG recommended   DRUGS TO AVOID IN HEART FAILURE  Drug or Class Mechanism  Analgesics . NSAIDs . COX-2 inhibitors . Glucocorticoids  Sodium and water retention, increased systemic vascular resistance, decreased response to diuretics   Diabetes Medications . Metformin . Thiazolidinediones o Rosiglitazone (Avandia) o Pioglitazone (Actos) . DPP4 Inhibitors o Saxagliptin (Onglyza) o Sitagliptin (Januvia)   Lactic acidosis Possible calcium channel blockade   Unknown  Antiarrhythmics . Class I  o Flecainide o Disopyramide . Class III o Sotalol . Other o Dronedarone  Negative inotrope, proarrhythmic   Proarrhythmic, beta blockade  Negative inotrope  Antihypertensives . Alpha Blockers o Doxazosin . Calcium Channel Blockers o Diltiazem o Verapamil o Nifedipine . Central Alpha Adrenergics o Moxonidine . Peripheral Vasodilators o Minoxidil  Increases renin and aldosterone  Negative inotrope    Possible sympathetic withdrawal  Unknown  Anti-infective . Itraconazole . Amphotericin B  Negative inotrope Unknown  Hematologic . Anagrelide . Cilostazol   Possible inhibition of PD IV Inhibition of PD III causing arrhythmias  Neurologic/Psychiatric . Stimulants . Anti-Seizure Drugs o Carbamazepine o Pregabalin . Antidepressants o Tricyclics o Citalopram . Parkinsons o Bromocriptine o Pergolide o Pramipexole . Antipsychotics o Clozapine . Antimigraine o Ergotamine o Methysergide . Appetite suppressants . Bipolar o Lithium   Peripheral alpha and beta agonist activity  Negative inotrope and chronotrope Calcium channel blockade  Negative inotrope, proarrhythmic Dose-dependent QT prolongation  Excessive serotonin activity/valvular damage Excessive serotonin activity/valvular damage Unknown  IgE mediated hypersensitivy, calcium channel blockade  Excessive serotonin activity/valvular damage Excessive serotonin activity/valvular damage Valvular damage  Direct myofibrillar degeneration, adrenergic stimulation  Antimalarials . Chloroquine . Hydroxychloroquine Intracellular inhibition of lysosomal enzymes  Urologic Agents . Alpha Blockers o Doxazosin o Prazosin o Tamsulosin o Terazosin  Increased renin and aldosterone  Adapted from Page RL, et al. "Drugs That May Cause or Exacerbate Heart Failure: A Scientific Statement from the [place]." Circulation 2016; 449:E01-E07. DOI: 10.1161/CIR.0000000000000426   COUNSELING POINTS/CLINICAL PEARLS  Carvedilol (Goal: weight less than 85 kg is 25 mg BID, weight greater than 85 kg is 50 mg BID)  Patient should avoid activities requiring coordination  until drug effects are realized, as drug may cause dizziness.  This drug may cause diarrhea, nausea, vomiting, arthralgia, back pain, myalgia, headache, vision disorder, erectile dysfunction, reduced libido, or fatigue.  Instruct patient to report signs/symptoms of adverse cardiovascular effects such as hypotension (especially in elderly patients), arrhythmias, syncope, palpitations, angina, or edema.  Drug may mask symptoms of hypoglycemia. Advise diabetic patients to carefully monitor blood sugar levels.  Patient should take drug with food.  Advise patient against sudden discontinuation of drug.  Furosemide  Drug causes sun-sensitivity. Advise patient to use sunscreen and avoid tanning beds. Patient should avoid activities requiring coordination until drug effects are realized, as drug may cause  dizziness, vertigo, or blurred vision. This drug may cause hyperglycemia, hyperuricemia, constipation, diarrhea, loss of appetite, nausea, vomiting, purpuric disorder, cramps, spasticity, asthenia, headache, paresthesia, or scaling eczema. Instruct patient to report unusual bleeding/bruising or signs/symptoms of hypotension, infection, pancreatitis, or ototoxicity (tinnitus, hearing impairment). Advise patient to report signs/symptoms of a severe skin reactions (flu-like symptoms, spreading red rash, or skin/mucous membrane blistering) or erythema multiforme. Instruct patient to eat high-potassium foods during drug therapy, as directed by healthcare professional.  Patient should not drink alcohol while taking this drug.   MEDICATION ADHERENCES TIPS AND STRATEGIES 1. Taking medication as prescribed improves patient outcomes in heart failure (reduces hospitalizations, improves symptoms, increases survival) 2. Side effects of medications can be managed by decreasing doses, switching agents, stopping drugs, or adding additional therapy. Please let someone in the [facility] know if you have having bothersome side effects so we can modify your regimen. Do not alter your medication regimen without talking to [place].  3. Medication reminders can help patients remember to take drugs on time. If you are missing or forgetting doses you can try linking behaviors, using pill boxes, or an electronic reminder like an alarm on your phone or an app. Some people can also get automated phone calls as medication reminders.   Time spent: 10 minutes  Laural Benes, Pharm.D. Clinical Pharmacist 11/23/2018

## 2018-11-23 NOTE — Patient Instructions (Addendum)
Medication Instructions:   OK to increase the coreg up to 25 twice a day Increase the imdur/isosorbide up to 60 daily  If you need a refill on your cardiac medications before your next appointment, please call your pharmacy.   Lab work: No new labs needed   If you have labs (blood work) drawn today and your tests are completely normal, you will receive your results only by: Marland Kitchen MyChart Message (if you have MyChart) OR . A paper copy in the mail If you have any lab test that is abnormal or we need to change your treatment, we will call you to review the results.   Testing/Procedures: No new testing needed   Follow-Up: At [facility] you and your health needs are our priority.  As part of our continuing mission to provide you with exceptional heart care, we have created designated Provider Care Teams.  These Care Teams include your primary Cardiologist (physician) and Advanced Practice Providers (APPs -  Physician Assistants and Nurse Practitioners) who all work together to provide you with the care you need, when you need it.  . You will need a follow up appointment in 6 months .   Please call our office 2 months in advance to schedule this appointment.    . Providers on your designated Care Team:   . Murray Hodgkins, NP . Christell Faith, PA-C . Marrianne Mood, PA-C  Any Other Special Instructions Will Be Listed Below (If Applicable).  For educational health videos Log in to : [email or web address] Or : [email or web address], password : [email or web address]

## 2018-11-23 NOTE — Patient Instructions (Signed)
Continue weighing daily and call for an overnight weight gain of > 2 pounds or a weekly weight gain of >5 pounds. 

## 2018-11-23 NOTE — Progress Notes (Signed)
Patient ID: [name] [name], male    DOB: 1961-02-09, 58 y.o.   MRN: [id number]  HPI  [name] [name] is a 58 y/o male with a history of CAD, DM, hyperlipidemia, HTN, CKD, PVD,orthostatic hypotension and chronic heart failure.   Echo report from 10/05/18 reviewed and showed an EF of 30-35% along with mild [name].   Cardiac catheterization done 04/03/14 which showed an EF of 40%.  Admitted 10/04/18 due to acute on chronic heart failure. Cardiology consult obtained. Initially needed IV lasix and then transitioned to oral diuretics. Medications adjusted due to hypotension. Discharged after 3 days.   He presents today for a follow-up visit with a chief complaint of minimal shortness of breath upon moderate exertion. He describes this as chronic in nature having been present for several years. He has associated fatigue and light-headedness along with this. He denies any difficulty sleeping, abdominal distention, palpitations, pedal edema, chest pain, cough or weight gain. Has EP appointment scheduled March 2020 but isn't sure he's interested in any device implantation.   Past Medical History:  Diagnosis Date  . CAD (coronary artery disease)    a. 04/2014 CABG x 4 (LIMA->LAD, VG->Diag, VG->OM, VG->PDA).  . Chronic systolic CHF (congestive heart failure) ([place])    a. 07/2014 Echo: EF 30-35%.  . CKD (chronic kidney disease), stage III ([place])    a. 12/2015 Creat 1.8.  . Diabetic foot ulcers ([place])    a. left foot 2nd digit ant 5 th digit 05/03/14; b. 08/2014 s/p amputation of toes on L foot.  . Hypercholesterolemia    a. 12/2015 TC 116, TG 154, HDL 24, LDL 61-->Atorvastatin 40.  Marland Kitchen Hypertension   . Hypertensive heart disease   . Ischemic cardiomyopathy    a. 07/2013 Echo: EF 20%; b. 07/2014 Echo: EF 30-35%, diff HK, Gr1 DD, mildly dil LA, nl PASP.  Marland Kitchen Myocardial infarction ([place])   . Neuropathy in diabetes ([place])   . Open wound    foot  . Orthostatic hypotension   . Peripheral vascular disease ([place])   . Type II  diabetes mellitus ([place])    a. 12/2015 HbA1c = 13.9.   Past Surgical History:  Procedure Laterality Date  . ACHILLES TENDON SURGERY Right 01/22/2017   Procedure: ACHILLES LENGTHENING/KIDNER/TAL/Teno achilles lengthening;  Surgeon: Samara Deist, DPM;  Location: [facility] ORS;  Service: Podiatry;  Laterality: Right;  . CARDIAC CATHETERIZATION     03/2014  . CATARACT EXTRACTION    . CORONARY ARTERY BYPASS GRAFT N/A 05/07/2014   Procedure: CORONARY ARTERY BYPASS GRAFTING (CABG);  Surgeon: Gaye Pollack, MD;  Location: [place];  Service: Open Heart Surgery;  Laterality: N/A;  Times 4 using left internal mammary artery and endoscopically harvested right saphenous vein  . INTRAOPERATIVE TRANSESOPHAGEAL ECHOCARDIOGRAM N/A 05/07/2014   Procedure: INTRAOPERATIVE TRANSESOPHAGEAL ECHOCARDIOGRAM;  Surgeon: Gaye Pollack, MD;  Location: [facility];  Service: Open Heart Surgery;  Laterality: N/A;  . left foot osteomyelitis and wound after surgery    . TOE AMPUTATION     Left 3 and 4 toes  . TONSILECTOMY/ADENOIDECTOMY WITH MYRINGOTOMY     Family History  Problem Relation Age of Onset  . Heart disease Father        CABG in his 55's  . Diabetes type II Unknown   . Kidney disease Unknown   . Hypertension Unknown    Social History   Tobacco Use  . Smoking status: Never Smoker  . Smokeless tobacco: Never Used  Substance Use Topics  .  Alcohol use: No   Allergies  Allergen Reactions  . Other Other (See Comments)    Pt. And wife state that he had a reaction to "some" antibiotic but they do not know what the name of it was.They are are very apprehensive about him receiving any antibiotic. Zosyn, Clindamycin, & Vancomycin (all started together unsure which caused reaction 07/18/2013)   Prior to Admission medications   Medication Sig Start Date End Date Taking? Authorizing Provider  aspirin EC 81 MG tablet Take 81 mg by mouth daily.   Yes [provider]  atorvastatin (LIPITOR) 40 MG tablet Take 1 tablet  (40 mg total) by mouth at bedtime. 11/23/18  Yes Minna Merritts, MD  carvedilol (COREG) 25 MG tablet Take 1 tablet (25 mg total) by mouth 2 (two) times daily with a meal. 11/23/18  Yes Gollan, Kathlene November, MD  ferrous sulfate 325 (65 FE) MG tablet Take 325 mg by mouth daily.   Yes [provider]  furosemide (LASIX) 20 MG tablet Take 1 tablet (20 mg total) by mouth daily. Patient taking differently: Take 20 mg by mouth daily as needed for fluid or edema.  10/11/18 12/10/18 Yes Dunn, Areta Haber, PA-C  glimepiride (AMARYL) 4 MG tablet Take 4 mg by mouth 2 (two) times daily with a meal.  11/20/16  Yes [provider]  hydrALAZINE (APRESOLINE) 25 MG tablet Take 1 tablet (25 mg total) by mouth 3 (three) times daily. 11/23/18  Yes Gollan, Kathlene November, MD  insulin aspart (NOVOLOG FLEXPEN) 100 UNIT/ML FlexPen Inject 0-16 Units into the skin 3 (three) times daily with meals. Sliding scale insulin   Yes [provider]  isosorbide mononitrate (IMDUR) 60 MG 24 hr tablet Take 1 tablet (60 mg total) by mouth daily. 11/23/18  Yes Gollan, Kathlene November, MD  nitroGLYCERIN (NITROSTAT) 0.4 MG SL tablet Place 1 tablet (0.4 mg total) under the tongue every 5 (five) minutes as needed for chest pain. 12/24/15  Yes Theora Gianotti, NP    Review of Systems  Constitutional: Positive for fatigue. Negative for appetite change.  HENT: Negative for congestion, postnasal drip and sore throat.   Eyes: Negative.   Respiratory: Positive for shortness of breath (with moderate exertion). Negative for cough and chest tightness.   Cardiovascular: Negative for chest pain, palpitations and leg swelling.  Gastrointestinal: Negative for abdominal distention and abdominal pain.  Endocrine: Negative.   Genitourinary: Negative.   Musculoskeletal: Negative for back pain and neck pain.  Skin: Positive for wound (right foot).  Allergic/Immunologic: Negative.   Neurological: Positive for light-headedness (at times) and  numbness (neuropathy). Negative for dizziness.  Hematological: Negative for adenopathy. Does not bruise/bleed easily.  Psychiatric/Behavioral: Negative for dysphoric mood and sleep disturbance (sleeping on 1-2 pillows). The patient is not nervous/anxious.    Vitals:   11/23/18 0940  BP: (!) 154/68  Pulse: 79  Resp: 18  SpO2: 99%  Weight: 263 lb 2 oz (119.4 kg)  Height: 6\' 1"  (1.854 m)   Wt Readings from Last 3 Encounters:  11/23/18 263 lb 2 oz (119.4 kg)  11/23/18 261 lb (118.4 kg)  10/19/18 257 lb 8 oz (116.8 kg)   Lab Results  Component Value Date   CREATININE 2.92 (H) 10/19/2018   CREATININE 2.91 (H) 10/14/2018   CREATININE 2.83 (H) 10/10/2018    Physical Exam Vitals signs and nursing note reviewed.  Constitutional:      Appearance: He is well-developed.  HENT:     Head: Normocephalic and  atraumatic.  Neck:     Musculoskeletal: Normal range of motion.  Cardiovascular:     Rate and Rhythm: Normal rate and regular rhythm.  Pulmonary:     Effort: Pulmonary effort is normal.     Breath sounds: No wheezing or rales.  Abdominal:     General: There is no distension.     Palpations: Abdomen is soft.  Musculoskeletal:        General: No tenderness.  Skin:    General: Skin is warm and dry.  Neurological:     Mental Status: He is alert and oriented to person, place, and time.  Psychiatric:        Behavior: Behavior normal.        Thought Content: Thought content normal.    Assessment & Plan:  1: Chronic heart failure with reduced ejection fraction- - NYHA class II - euvolemic - weighing daily; reminded to call for an overnight weight gain of >2 pounds or a weekly weight gain of >5 pounds - weight down 1 pound from last visit here 6 weeks ago - not adding salt to his food and has been reading food labels. Reviewed the importance of closely following a 2000mg  sodium diet  - carvedilol just increased to 25mg  BID by cardiology today - losartan/furosemide currently on  hold due to hyperkalemia/ renal function - saw cardiology Rockey Situ) 11/23/18 - has EP Caryl Comes) appointment March 2020; discussed Zoll lifevest but patient is not interested and says that he's worn it before and it "kept going off every time I moved in the bed". Explained potential benefits but he is still not interested - BNP 10/04/18 was 1648.0 - he says that he's already received his flu vaccine - PharmD reconciled medications with the patient  2: HTN- - BP elevated although hasn't taken his medications yet today; normally takes them ~ 10am. Has history of orthostatic hypotension - saw PCP (Hande) 09/12/18 - BMP 10/10/18 reviewed and showed sodium 140, potassium 3.8, creatinine 2.83 and GFR 24  3: DM-  - seeing podiatry due to foot ulcer; last seen 10/31/18 - A1c 10/05/18 was 6.2%  Patient did not bring his medications nor a list. Each medication was verbally reviewed with the patient and he was encouraged to bring the bottles to every visit to confirm accuracy of list.  Return in 6 months or sooner for any questions/problems before then.

## 2018-12-05 DIAGNOSIS — Z794 Long term (current) use of insulin: Secondary | ICD-10-CM | POA: Diagnosis not present

## 2018-12-05 DIAGNOSIS — E1142 Type 2 diabetes mellitus with diabetic polyneuropathy: Secondary | ICD-10-CM | POA: Diagnosis not present

## 2018-12-05 DIAGNOSIS — L97511 Non-pressure chronic ulcer of other part of right foot limited to breakdown of skin: Secondary | ICD-10-CM | POA: Diagnosis not present

## 2019-01-10 DIAGNOSIS — I5022 Chronic systolic (congestive) heart failure: Secondary | ICD-10-CM | POA: Diagnosis not present

## 2019-01-10 DIAGNOSIS — I255 Ischemic cardiomyopathy: Secondary | ICD-10-CM | POA: Diagnosis not present

## 2019-01-10 DIAGNOSIS — Z794 Long term (current) use of insulin: Secondary | ICD-10-CM | POA: Diagnosis not present

## 2019-01-10 DIAGNOSIS — R05 Cough: Secondary | ICD-10-CM | POA: Diagnosis not present

## 2019-01-10 DIAGNOSIS — E1122 Type 2 diabetes mellitus with diabetic chronic kidney disease: Secondary | ICD-10-CM | POA: Diagnosis not present

## 2019-01-10 DIAGNOSIS — D649 Anemia, unspecified: Secondary | ICD-10-CM | POA: Diagnosis not present

## 2019-01-10 DIAGNOSIS — E1165 Type 2 diabetes mellitus with hyperglycemia: Secondary | ICD-10-CM | POA: Diagnosis not present

## 2019-01-10 DIAGNOSIS — E7800 Pure hypercholesterolemia, unspecified: Secondary | ICD-10-CM | POA: Diagnosis not present

## 2019-01-10 DIAGNOSIS — E786 Lipoprotein deficiency: Secondary | ICD-10-CM | POA: Diagnosis not present

## 2019-01-10 DIAGNOSIS — E1140 Type 2 diabetes mellitus with diabetic neuropathy, unspecified: Secondary | ICD-10-CM | POA: Diagnosis not present

## 2019-01-10 DIAGNOSIS — Z6835 Body mass index (BMI) 35.0-35.9, adult: Secondary | ICD-10-CM | POA: Diagnosis not present

## 2019-01-10 DIAGNOSIS — N183 Chronic kidney disease, stage 3 (moderate): Secondary | ICD-10-CM | POA: Diagnosis not present

## 2019-01-17 DIAGNOSIS — Z89422 Acquired absence of other left toe(s): Secondary | ICD-10-CM | POA: Diagnosis not present

## 2019-01-17 DIAGNOSIS — Z951 Presence of aortocoronary bypass graft: Secondary | ICD-10-CM | POA: Diagnosis not present

## 2019-01-17 DIAGNOSIS — E875 Hyperkalemia: Secondary | ICD-10-CM | POA: Diagnosis not present

## 2019-01-17 DIAGNOSIS — M7918 Myalgia, other site: Secondary | ICD-10-CM | POA: Diagnosis not present

## 2019-01-17 DIAGNOSIS — E1122 Type 2 diabetes mellitus with diabetic chronic kidney disease: Secondary | ICD-10-CM | POA: Diagnosis not present

## 2019-01-17 DIAGNOSIS — I739 Peripheral vascular disease, unspecified: Secondary | ICD-10-CM | POA: Diagnosis not present

## 2019-01-17 DIAGNOSIS — Z0000 Encounter for general adult medical examination without abnormal findings: Secondary | ICD-10-CM | POA: Diagnosis not present

## 2019-01-17 DIAGNOSIS — Z6834 Body mass index (BMI) 34.0-34.9, adult: Secondary | ICD-10-CM | POA: Diagnosis not present

## 2019-01-17 DIAGNOSIS — D649 Anemia, unspecified: Secondary | ICD-10-CM | POA: Diagnosis not present

## 2019-01-17 DIAGNOSIS — I255 Ischemic cardiomyopathy: Secondary | ICD-10-CM | POA: Diagnosis not present

## 2019-01-19 ENCOUNTER — Encounter: Payer: Self-pay | Admitting: Internal Medicine

## 2019-01-19 ENCOUNTER — Ambulatory Visit: Payer: Medicare HMO | Admitting: Internal Medicine

## 2019-01-19 VITALS — BP 140/78 | HR 74 | Ht 73.0 in | Wt 266.5 lb

## 2019-01-19 DIAGNOSIS — I255 Ischemic cardiomyopathy: Secondary | ICD-10-CM

## 2019-01-19 DIAGNOSIS — I472 Ventricular tachycardia, unspecified: Secondary | ICD-10-CM

## 2019-01-19 NOTE — Patient Instructions (Signed)
Medication Instructions:  - Your physician recommends that you continue on your current medications as directed. Please refer to the Current Medication list given to you today.  If you need a refill on your cardiac medications before your next appointment, please call your pharmacy.   Lab work: - none ordered  If you have labs (blood work) drawn today and your tests are completely normal, you will receive your results only by: Marland Kitchen MyChart Message (if you have MyChart) OR . A paper copy in the mail If you have any lab test that is abnormal or we need to change your treatment, we will call you to review the results.  Testing/Procedures: - Your physician has requested that you have an echocardiogram. Echocardiography is a painless test that uses sound waves to create images of your heart. It provides your doctor with information about the size and shape of your heart and how well your heart's chambers and valves are working. This procedure takes approximately one hour. There are no restrictions for this procedure.  Follow-Up: At [facility], you and your health needs are our priority.  As part of our continuing mission to provide you with exceptional heart care, we have created designated Provider Care Teams.  These Care Teams include your primary Cardiologist (physician) and Advanced Practice Providers (APPs -  Physician Assistants and Nurse Practitioners) who all work together to provide you with the care you need, when you need it. . pending the results of your echo  Any Other Special Instructions Will Be Listed Below (If Applicable). - N/A

## 2019-01-19 NOTE — Progress Notes (Signed)
ELECTROPHYSIOLOGY CONSULT NOTE  Patient ID: [name] [name], MRN: [id number], DOB/AGE: September 12, 1961 58 y.o. Admit date: (Not on file) Date of Consult: 01/19/2019  Primary Physician: Tracie Harrier, MD Primary Cardiologist: AUSTEN OYSTER is a 58 y.o. male who is being seen today for the evaluation of ICM at the request of TG.    HPI [name] [name] is a 58 y.o. male referred for consideration of an ICD.  Ischemic heart disease with an MI that was silent.  He underwent bypass surgery 2014.  Hosp 11/19 with CHF >> 15 lb diuresis and can use with modest exercise intolerance.  Denies nocturnal dyspnea orthopnea; has some edema.  No chest pain.  No syncope or palpitations.  History of a MRSA infection that was complicated by shock and renal failure for which she is only partially recovered.  Now with class IV renal insufficiency.  Guideline directed therapy includes carvedilol and isosorbide hydralazine    DATE TEST EF   5/15 LHC  40%  3V CAD>> CABG  7/18 Echo   40-45 %   11/19 Echo   30-35 %   11/19 MYOVIEW 36 Inferior Scar Min ischemia         Date Cr K Hgb  12/19 2.92 5.1 11.2  2/20  2.9 5.0 11.4      Past Medical History:  Diagnosis Date  . CAD (coronary artery disease)    a. 04/2014 CABG x 4 (LIMA->LAD, VG->Diag, VG->OM, VG->PDA).  . Chronic systolic CHF (congestive heart failure) ([place])    a. 07/2014 Echo: EF 30-35%.  . CKD (chronic kidney disease), stage III ([place])    a. 12/2015 Creat 1.8.  . Diabetic foot ulcers ([place])    a. left foot 2nd digit ant 5 th digit 05/03/14; b. 08/2014 s/p amputation of toes on L foot.  . Hypercholesterolemia    a. 12/2015 TC 116, TG 154, HDL 24, LDL 61-->Atorvastatin 40.  Marland Kitchen Hypertension   . Hypertensive heart disease   . Ischemic cardiomyopathy    a. 07/2013 Echo: EF 20%; b. 07/2014 Echo: EF 30-35%, diff HK, Gr1 DD, mildly dil LA, nl PASP.  Marland Kitchen Myocardial infarction ([place])   . Neuropathy in diabetes ([place])   . Open wound    foot  . Orthostatic hypotension   . Peripheral vascular disease ([place])   . Type II diabetes mellitus ([place])    a. 12/2015 HbA1c = 13.9.      Surgical History:  Past Surgical History:  Procedure Laterality Date  . ACHILLES TENDON SURGERY Right 01/22/2017   Procedure: ACHILLES LENGTHENING/KIDNER/TAL/Teno achilles lengthening;  Surgeon: Samara Deist, DPM;  Location: [facility] ORS;  Service: Podiatry;  Laterality: Right;  . CARDIAC CATHETERIZATION     03/2014  . CATARACT EXTRACTION    . CORONARY ARTERY BYPASS GRAFT N/A 05/07/2014   Procedure: CORONARY ARTERY BYPASS GRAFTING (CABG);  Surgeon: Gaye Pollack, MD;  Location: [place];  Service: Open Heart Surgery;  Laterality: N/A;  Times 4 using left internal mammary artery and endoscopically harvested right saphenous vein  . INTRAOPERATIVE TRANSESOPHAGEAL ECHOCARDIOGRAM N/A 05/07/2014   Procedure: INTRAOPERATIVE TRANSESOPHAGEAL ECHOCARDIOGRAM;  Surgeon: Gaye Pollack, MD;  Location[facility] OR;  Service: Open Heart Surgery;  Laterality: N/A;  . left foot osteomyelitis and wound after surgery    . TOE AMPUTATION     Left 3 and 4 toes  . TONSILECTOMY/ADENOIDECTOMY WITH MYRINGOTOMY       Home Meds: Current Meds  Medication Sig  . aspirin EC 81 MG tablet Take 81 mg by mouth daily.  Marland Kitchen atorvastatin (LIPITOR) 40 MG tablet Take 1 tablet (40 mg total) by mouth at bedtime.  . carvedilol (COREG) 25 MG tablet Take 1 tablet (25 mg total) by mouth 2 (two) times daily with a meal.  . ferrous sulfate 325 (65 FE) MG tablet Take 325 mg by mouth daily.  . furosemide (LASIX) 20 MG tablet Take 20 mg by mouth daily as needed.  Marland Kitchen glimepiride (AMARYL) 4 MG tablet Take 4 mg by mouth 2 (two) times daily with a meal.   . hydrALAZINE (APRESOLINE) 25 MG tablet Take 1 tablet (25 mg total) by mouth 3 (three) times daily.  . insulin aspart (NOVOLOG FLEXPEN) 100 UNIT/ML FlexPen Inject 0-16 Units into the skin 3 (three) times daily with meals. Sliding scale insulin  . isosorbide  mononitrate (IMDUR) 60 MG 24 hr tablet Take 1 tablet (60 mg total) by mouth daily.  . nitroGLYCERIN (NITROSTAT) 0.4 MG SL tablet Place 1 tablet (0.4 mg total) under the tongue every 5 (five) minutes as needed for chest pain.    Allergies:  Allergies  Allergen Reactions  . Other Other (See Comments)    Pt. And wife state that he had a reaction to "some" antibiotic but they do not know what the name of it was.They are are very apprehensive about him receiving any antibiotic. Zosyn, Clindamycin, & Vancomycin (all started together unsure which caused reaction 07/18/2013)    Social History   Socioeconomic History  . Marital status: Married    Spouse name: Not on file  . Number of children: Not on file  . Years of education: Not on file  . Highest education level: Not on file  Occupational History  . Not on file  Social Needs  . Financial resource strain: Not on file  . Food insecurity:    Worry: Not on file    Inability: Not on file  . Transportation needs:    Medical: Not on file    Non-medical: Not on file  Tobacco Use  . Smoking status: Never Smoker  . Smokeless tobacco: Never Used  Substance and Sexual Activity  . Alcohol use: No  . Drug use: No  . Sexual activity: Not on file  Lifestyle  . Physical activity:    Days per week: Not on file    Minutes per session: Not on file  . Stress: Not on file  Relationships  . Social connections:    Talks on phone: Not on file    Gets together: Not on file    Attends religious service: Not on file    Active member of club or organization: Not on file    Attends meetings of clubs or organizations: Not on file    Relationship status: Not on file  . Intimate partner violence:    Fear of current or ex partner: Not on file    Emotionally abused: Not on file    Physically abused: Not on file    Forced sexual activity: Not on file  Other Topics Concern  . Not on file  Social History Narrative  . Not on file     Family History    Problem Relation Age of Onset  . Heart disease Father        CABG in his 33's  . Diabetes type II Other   . Kidney disease Other   . Hypertension Other   . Ovarian cancer Mother  ROS:  Please see the history of present illness.     All other systems reviewed and negative.    Physical Exam: Blood pressure 140/78, pulse 74, height 6\' 1"  (1.854 m), weight 266 lb 8 oz (120.9 kg), SpO2 99 %. General: Well developed, well nourished male in no acute distress. Head: Normocephalic, atraumatic, sclera non-icteric, no xanthomas, nares are without discharge. EENT: normal  Lymph Nodes:  none Neck: Negative for carotid bruits. JVD not elevated. Back:without scoliosis kyphosis Lungs: Clear bilaterally to auscultation without wheezes, rales, or rhonchi. Breathing is unlabored. Heart: RRR with S1 S2.  2/6 systolic murmur . No rubs, or gallops appreciated. Abdomen: Soft, non-tender, non-distended with normoactive bowel sounds. No hepatomegaly. No rebound/guarding. No obvious abdominal masses. Msk:  Strength and tone appear normal for age. Extremities: No clubbing or cyanosis. No  edema.  Distal pedal pulses are 2+ and equal bilaterally. Skin: Warm and Dry Neuro: Alert and oriented X 3. CN III-XII intact Grossly normal sensory and motor function . Psych:  Responds to questions appropriately with a normal affect.      Labs: Cardiac Enzymes No results for input(s): CKTOTAL, CKMB, TROPONINI in the last 72 hours. CBC Lab Results  Component Value Date   WBC 5.8 10/19/2018   HGB 11.2 (L) 10/19/2018   HCT 35.1 (L) 10/19/2018   MCV 93.1 10/19/2018   PLT 223 10/19/2018   PROTIME: No results for input(s): LABPROT, INR in the last 72 hours. Chemistry No results for input(s): NA, K, CL, CO2, BUN, CREATININE, CALCIUM, PROT, BILITOT, ALKPHOS, ALT, AST, GLUCOSE in the last 168 hours.  Invalid input(s): LABALBU Lipids No results found for: CHOL, HDL, LDLCALC, TRIG BNP No results found for:  PROBNP Thyroid Function Tests: No results for input(s): TSH, T4TOTAL, T3FREE, THYROIDAB in the last 72 hours.  Invalid input(s): FREET3 Miscellaneous No results found for: DDIMER  Radiology/Studies:  No results found.  EKG: ECG demonstrates sinus rhythm at 74 Intervals 17/09/40 Septal MI Anterior wall MI  Event Recorder personnally reviewed nonsustained ventricular tachycardia Assessment and Plan:  Ischemic cardiomyopathy  Congestive heart failure-chronic-systolic-class IIb  Ventricular tachycardia-nonsustained  Renal insufficiency grade 4    The patient has left ventricular dysfunction in the context of his prior myocardial infarction and bypass surgery.  He has nonsustained ventricular tachycardia which increases the risk of overall mortality as well as sudden death mortality and ischemic cohort.  He was referred for consideration of an ICD and he is at this point somewhat disinclined to proceed with that; he hopes that with more activity in the summer his LV function will improve.  We reviewed extensively the risks and benefits of an ICD.  We have agreed to an intermediate plan whereby we will reassess his LV function at this time.  In the event that his LV function is intermediate, we will discuss the role of EP testing for further risk stratification given his nonsustained VT with inducibility being a marker for increased risk and perhaps an ICD.       Virl Axe

## 2019-02-06 ENCOUNTER — Telehealth: Payer: Self-pay

## 2019-02-06 NOTE — Telephone Encounter (Signed)
Patient cancelled upcoming Echo appt due to COVID risk

## 2019-02-08 NOTE — Telephone Encounter (Signed)
No answer. Left detailed message, ok per DPR. Let him know ok to cancel echo appointment and to think about calling us in about 4 weeks to reschedule. Also, to call us if any new questions or concerns arise.

## 2019-02-09 ENCOUNTER — Other Ambulatory Visit: Payer: Medicare HMO

## 2019-02-23 DIAGNOSIS — E875 Hyperkalemia: Secondary | ICD-10-CM | POA: Diagnosis not present

## 2019-02-23 DIAGNOSIS — E1129 Type 2 diabetes mellitus with other diabetic kidney complication: Secondary | ICD-10-CM | POA: Diagnosis not present

## 2019-02-23 DIAGNOSIS — N184 Chronic kidney disease, stage 4 (severe): Secondary | ICD-10-CM | POA: Diagnosis not present

## 2019-02-23 DIAGNOSIS — R809 Proteinuria, unspecified: Secondary | ICD-10-CM | POA: Diagnosis not present

## 2019-05-17 ENCOUNTER — Telehealth: Payer: Medicare HMO | Admitting: Family

## 2019-05-17 ENCOUNTER — Telehealth: Payer: Self-pay | Admitting: Family

## 2019-05-17 NOTE — Telephone Encounter (Signed)
Patient did not return message regarding telemedicine visit with the [facility] on 05/17/2019. Will attempt to reschedule.

## 2019-05-23 DIAGNOSIS — I255 Ischemic cardiomyopathy: Secondary | ICD-10-CM | POA: Diagnosis not present

## 2019-05-23 DIAGNOSIS — E1122 Type 2 diabetes mellitus with diabetic chronic kidney disease: Secondary | ICD-10-CM | POA: Diagnosis not present

## 2019-05-23 DIAGNOSIS — Z6834 Body mass index (BMI) 34.0-34.9, adult: Secondary | ICD-10-CM | POA: Diagnosis not present

## 2019-05-23 DIAGNOSIS — D649 Anemia, unspecified: Secondary | ICD-10-CM | POA: Diagnosis not present

## 2019-05-23 DIAGNOSIS — M7918 Myalgia, other site: Secondary | ICD-10-CM | POA: Diagnosis not present

## 2019-05-23 DIAGNOSIS — Z951 Presence of aortocoronary bypass graft: Secondary | ICD-10-CM | POA: Diagnosis not present

## 2019-05-23 DIAGNOSIS — N184 Chronic kidney disease, stage 4 (severe): Secondary | ICD-10-CM | POA: Diagnosis not present

## 2019-05-23 DIAGNOSIS — E875 Hyperkalemia: Secondary | ICD-10-CM | POA: Diagnosis not present

## 2019-05-23 DIAGNOSIS — I739 Peripheral vascular disease, unspecified: Secondary | ICD-10-CM | POA: Diagnosis not present

## 2019-05-30 DIAGNOSIS — I255 Ischemic cardiomyopathy: Secondary | ICD-10-CM | POA: Diagnosis not present

## 2019-05-30 DIAGNOSIS — I25810 Atherosclerosis of coronary artery bypass graft(s) without angina pectoris: Secondary | ICD-10-CM | POA: Diagnosis not present

## 2019-05-30 DIAGNOSIS — E875 Hyperkalemia: Secondary | ICD-10-CM | POA: Diagnosis not present

## 2019-05-30 DIAGNOSIS — I5022 Chronic systolic (congestive) heart failure: Secondary | ICD-10-CM | POA: Diagnosis not present

## 2019-05-30 DIAGNOSIS — N184 Chronic kidney disease, stage 4 (severe): Secondary | ICD-10-CM | POA: Diagnosis not present

## 2019-05-30 DIAGNOSIS — E1122 Type 2 diabetes mellitus with diabetic chronic kidney disease: Secondary | ICD-10-CM | POA: Diagnosis not present

## 2019-05-30 DIAGNOSIS — I739 Peripheral vascular disease, unspecified: Secondary | ICD-10-CM | POA: Diagnosis not present

## 2019-05-30 DIAGNOSIS — D649 Anemia, unspecified: Secondary | ICD-10-CM | POA: Diagnosis not present

## 2019-05-30 DIAGNOSIS — Z0000 Encounter for general adult medical examination without abnormal findings: Secondary | ICD-10-CM | POA: Diagnosis not present

## 2019-06-05 ENCOUNTER — Encounter: Payer: Self-pay | Admitting: Internal Medicine

## 2019-06-05 ENCOUNTER — Telehealth: Payer: Self-pay | Admitting: *Deleted

## 2019-06-05 NOTE — Telephone Encounter (Signed)
-----   Message from Clarisse Gouge sent at 06/05/2019  2:21 PM EDT ----- Attempted to reach .  No ans no vm .  This is 4th attempt .  Mailing letter and deferring in orders wq.   Thanks  :-) ----- Message ----- From: Emily Filbert, RN Sent: 06/05/2019  11:24 AM EDT To: Emily Filbert, RN, [facility] Scheduling  Patient has an echo order pending from March with Dr. Caryl Comes- do you mind reaching out to him to try to schedule please.

## 2019-06-05 NOTE — Telephone Encounter (Signed)
Noted  

## 2019-06-06 DIAGNOSIS — N184 Chronic kidney disease, stage 4 (severe): Secondary | ICD-10-CM | POA: Diagnosis not present

## 2019-06-06 DIAGNOSIS — I129 Hypertensive chronic kidney disease with stage 1 through stage 4 chronic kidney disease, or unspecified chronic kidney disease: Secondary | ICD-10-CM | POA: Diagnosis not present

## 2019-06-06 DIAGNOSIS — I5022 Chronic systolic (congestive) heart failure: Secondary | ICD-10-CM | POA: Diagnosis not present

## 2019-06-06 DIAGNOSIS — E1129 Type 2 diabetes mellitus with other diabetic kidney complication: Secondary | ICD-10-CM | POA: Diagnosis not present

## 2019-06-28 DIAGNOSIS — E875 Hyperkalemia: Secondary | ICD-10-CM | POA: Diagnosis not present

## 2019-06-29 ENCOUNTER — Ambulatory Visit (INDEPENDENT_AMBULATORY_CARE_PROVIDER_SITE_OTHER): Payer: Medicare HMO

## 2019-06-29 ENCOUNTER — Other Ambulatory Visit: Payer: Self-pay

## 2019-06-29 DIAGNOSIS — I255 Ischemic cardiomyopathy: Secondary | ICD-10-CM

## 2019-06-29 MED ORDER — PERFLUTREN LIPID MICROSPHERE
1.0000 mL | INTRAVENOUS | Status: AC | PRN
  Administered 2019-06-29: 2 mL via INTRAVENOUS

## 2019-07-03 NOTE — Progress Notes (Signed)
Cardiology Office Note    Date:  07/06/2019   ID:  [name] [name], [name] [name] 05-05-1961, MRN [id number]  PCP:  Tracie Harrier, MD  Cardiologist:  Ida Rogue, MD  Electrophysiologist:  Virl Axe, MD   Chief Complaint: Follow-up  History of Present Illness:   [name] [name] is a 58 y.o. male with history of CAD s/p 4-vessel CABG in 04/2014 with a LIMA to LAD, SVG to diagonal, SVG to OM, and SVG to PDA, HFrEF secondary to ICM,  NSVT, paroxysmal SVT, PVD, poorly controlled insulin-dependent diabetes with diabetic foot ulcers and diabetic peripheral neuropathy requiring amputations of the left toe/foot, osteomyelitis, CKD stage IV with prior hyperkalemia, HTN, HLD, asymptomatic orthostatic hypotension, and medication compliance issues secondary to finances who presents for follow up of his CAD and cardiomyopathy.  Prior echo in 07/2013 showed an EF of 20%.  Stress testing undertaken at that time showed a fixed inferior and septal wall large defect without much reversibility which was felt to be due to possible prior silent MI in the LAD and RCA territory without ischemia.  This was a moderate risk scan.  Patient subsequently underwent cardiac cath in 03/2014 that showed 40% stenosis in the mid left main, 70% stenosis in the mid LAD, 70% stenosis in D2, 80% proximal LCx stenosis, 80% mid LCx stenosis, 99% proximal RCA stenosis, R PLA 99% stenosis.  EF was estimated to be 40%.  Following this, he proceeded to have four-vessel CABG as outlined above.  Follow-up echo in 05/2017 showed an improved EF to 40 to 45%, unable to exclude regional wall motion abnormalities, grade 1 diastolic dysfunction, mild mitral regurgitation, mildly dilated left atrium, RV systolic function normal, PASP normal.  Patient underwent repeat ischemic evaluation via nuclear stress test in 02/2018 showed a large defect of moderate severity present in the basal inferior, basal inferolateral, mid inferior, mid inferolateral, and  apical inferior location.  These findings were consistent with prior MI with mild peri-infarct ischemia.  EF was estimated at 30 to 44%.  Overall this was an intermediate risk study.  He was admitted to the hospital in 09/2018 with increased shortness of breath felt to be secondary to acute on chronic systolic CHF and upper respiratory infection with admission complicated by acute on chronic renal failure in the setting of dietary noncompliance.  Echo during that admission showed an EF of 30 to 35%, diffuse hypokinesis, grade 1 diastolic dysfunction, mild mitral regurgitation, mildly dilated left atrium, normal RV systolic function, and a left pleural effusion measuring 8 cm.  He underwent nuclear stress test on 10/07/2018 that showed a large region of fixed inferior and inferior lateral wall perfusion defect consistent with previous MI with very mild ischemia in the inferolateral wall.  Hypokinesis was noted of the inferior wall.  EF was estimated at 36%.  No EKG changes were concerning for ischemia at peak stress or in recovery.  Overall this was a moderate risk scan with similar findings on study noted in 02/2018.  Patient underwent greater than 15 pound diuresis.  He was noted to have short runs of NSVT during this admission leading to titration of his Coreg.  Outpatient cardiac monitoring in 10/2018 showed NSR with an average heart rate of 81 bpm, 8 runs of NSVT with the fastest interval lasting 4 beats with a maximal rate of 200 bpm and the longest interval lasting 17 beats with an average rate of 120 bpm, 7 SVT runs occurred with the fastest interval lasting 5 beats  with a maximal rate of 148 bpm and the longest interval lasting 13 beats with an average rate of 110 bpm.  Isolated PACs, atrial couplets, and atrial triplets were rare, isolated PVCs and ventricular couplets were rare, ventricular trigeminy was present.  In this setting, he was referred to EP for consideration of ICD given his left ventricular  dysfunction with prior MI, bypass surgery, and NSVT.  He was evaluated by them in 01/2019 and declined ICD at that time with recommendation to repeat echo.  Subsequent repeat echo in 06/2019 (deferred in the setting of COVID-19 pandemic) showed a persistent cardiomyopathy with an EF of 30 to 35% with normal LV cavity size, diastolic dysfunction, diffuse left ventricular hypokinesis, normal RV systolic function, normal RV cavity size, moderately dilated left atrium, mild mitral regurgitation.  Patient comes in feeling very well today.  Since he was last seen he has significantly increased his activity level.  He is also eating a healthier diet.  With this, his weight is down 24 pounds since he was last seen.  He indicates he has not needed an as needed Lasix in at least 6 months.  He continues to note fluctuations in blood pressure with readings ranging from 315 systolic down into the 17O systolic.  In this setting, he is only taking hydralazine 25 mg twice daily rather than 3 times daily and is only taking Imdur 30 mg daily rather than 60 mg daily.  Patient was recently seen by PCP on 05/30/2019 and restarted on losartan 25 mg daily with follow-up renal function on 8/12 demonstrating stable creatinine and potassium as outlined below.  The patient denies any chest pain, shortness of breath, palpitations, presyncope, or syncope.  He does note some dizziness when his blood pressure runs on the low side.  He denies any lower extremity swelling, abdominal distention, orthopnea, PND, early satiety.  No falls, BRBPR, or melena.   Labs: 06/2019 - potassium 5.0, serum creatinine 3.0 05/2019 - WBC 7.0, Hgb 11.1, PLT 213, total cholesterol 107, triglyceride 80, HDL 28, LDL 63, TSH normal   Past Medical History:  Diagnosis Date   CAD (coronary artery disease)    a. 04/2014 CABG x 4 (LIMA->LAD, VG->Diag, VG->OM, VG->PDA).   Chronic systolic CHF (congestive heart failure) ([place])    a. 07/2014 Echo: EF 30-35%.   CKD  (chronic kidney disease), stage III ([place])    a. 12/2015 Creat 1.8.   Diabetic foot ulcers ([place])    a. left foot 2nd digit ant 5 th digit 05/03/14; b. 08/2014 s/p amputation of toes on L foot.   Hypercholesterolemia    a. 12/2015 TC 116, TG 154, HDL 24, LDL 61-->Atorvastatin 40.   Hypertension    Hypertensive heart disease    Ischemic cardiomyopathy    a. 07/2013 Echo: EF 20%; b. 07/2014 Echo: EF 30-35%, diff HK, Gr1 DD, mildly dil LA, nl PASP.   Myocardial infarction ([place])    Neuropathy in diabetes [facility])    Open wound    foot   Orthostatic hypotension    Peripheral vascular disease ([place])    Type II diabetes mellitus ([place])    a. 12/2015 HbA1c = 13.9.    Past Surgical History:  Procedure Laterality Date   ACHILLES TENDON SURGERY Right 01/22/2017   Procedure: ACHILLES LENGTHENING/KIDNER/TAL/Teno achilles lengthening;  Surgeon: Samara Deist, DPM;  Location: [facility] ORS;  Service: Podiatry;  Laterality: Right;   CARDIAC CATHETERIZATION     03/2014   CATARACT EXTRACTION     CORONARY  ARTERY BYPASS GRAFT N/A 05/07/2014   Procedure: CORONARY ARTERY BYPASS GRAFTING (CABG);  Surgeon: Gaye Pollack, MD;  Location: [facility];  Service: Open Heart Surgery;  Laterality: N/A;  Times 4 using left internal mammary artery and endoscopically harvested right saphenous vein   INTRAOPERATIVE TRANSESOPHAGEAL ECHOCARDIOGRAM N/A 05/07/2014   Procedure: INTRAOPERATIVE TRANSESOPHAGEAL ECHOCARDIOGRAM;  Surgeon: Gaye Pollack, MD;  Location: [place];  Service: Open Heart Surgery;  Laterality: N/A;   left foot osteomyelitis and wound after surgery     TOE AMPUTATION     Left 3 and 4 toes   TONSILECTOMY/ADENOIDECTOMY WITH MYRINGOTOMY      Current Medications: Current Meds  Medication Sig   aspirin EC 81 MG tablet Take 81 mg by mouth daily.   atorvastatin (LIPITOR) 40 MG tablet Take 1 tablet (40 mg total) by mouth at bedtime.   carvedilol (COREG) 25 MG tablet Take 1 tablet (25 mg total) by mouth 2  (two) times daily with a meal.   Cyanocobalamin (VITAMIN B 12 PO) Take by mouth daily.   ferrous sulfate 325 (65 FE) MG tablet Take 325 mg by mouth daily.   furosemide (LASIX) 20 MG tablet Take 20 mg by mouth daily as needed.   glimepiride (AMARYL) 4 MG tablet Take 4 mg by mouth 2 (two) times daily with a meal.    hydrALAZINE (APRESOLINE) 25 MG tablet Take 1 tablet (25 mg total) by mouth 2 (two) times daily.   HYDROcodone-acetaminophen (NORCO/VICODIN) 5-325 MG tablet Take by mouth as needed.   insulin aspart (NOVOLOG FLEXPEN) 100 UNIT/ML FlexPen Inject 0-16 Units into the skin 3 (three) times daily with meals. Sliding scale insulin   isosorbide mononitrate (IMDUR) 60 MG 24 hr tablet Take 0.5 tablets (30 mg total) by mouth daily.   losartan (COZAAR) 25 MG tablet Take by mouth daily.   nitroGLYCERIN (NITROSTAT) 0.4 MG SL tablet Place 1 tablet (0.4 mg total) under the tongue every 5 (five) minutes as needed for chest pain.   sodium bicarbonate 650 MG tablet 2 (two) times daily.   [DISCONTINUED] hydrALAZINE (APRESOLINE) 25 MG tablet Take 1 tablet (25 mg total) by mouth 3 (three) times daily.   [DISCONTINUED] isosorbide mononitrate (IMDUR) 60 MG 24 hr tablet Take 1 tablet (60 mg total) by mouth daily.     Allergies:   Other   Social History   Socioeconomic History   Marital status: Married    Spouse name: Not on file   Number of children: Not on file   Years of education: Not on file   Highest education level: Not on file  Occupational History   Not on file  Social Needs   Financial resource strain: Not on file   Food insecurity    Worry: Not on file    Inability: Not on file   Transportation needs    Medical: Not on file    Non-medical: Not on file  Tobacco Use   Smoking status: Never Smoker   Smokeless tobacco: Never Used  Substance and Sexual Activity   Alcohol use: No   Drug use: No   Sexual activity: Not on file  Lifestyle   Physical activity     Days per week: Not on file    Minutes per session: Not on file   Stress: Not on file  Relationships   Social connections    Talks on phone: Not on file    Gets together: Not on file    Attends religious service: Not on  file    Active member of club or organization: Not on file    Attends meetings of clubs or organizations: Not on file    Relationship status: Not on file  Other Topics Concern   Not on file  Social History Narrative   Not on file     Family History:  The patient's family history includes Diabetes type II in an other family member; Heart disease in his father; Hypertension in an other family member; Kidney disease in an other family member; Ovarian cancer in his mother.  ROS:   Review of Systems  Constitutional: Positive for malaise/fatigue. Negative for chills, diaphoresis, fever and weight loss.  HENT: Negative for congestion.   Eyes: Negative for discharge and redness.  Respiratory: Negative for cough, hemoptysis, sputum production, shortness of breath and wheezing.   Cardiovascular: Negative for chest pain, palpitations, orthopnea, claudication, leg swelling and PND.  Gastrointestinal: Negative for abdominal pain, blood in stool, heartburn, melena, nausea and vomiting.  Genitourinary: Negative for hematuria.       Positive for erectile dysfunction  Musculoskeletal: Negative for falls and myalgias.  Skin: Negative for rash.  Neurological: Positive for dizziness. Negative for tingling, tremors, sensory change, speech change, focal weakness, loss of consciousness and weakness.  Endo/Heme/Allergies: Does not bruise/bleed easily.  Psychiatric/Behavioral: Negative for substance abuse. The patient is not nervous/anxious.   All other systems reviewed and are negative.    EKGs/Labs/Other Studies Reviewed:    Studies reviewed were summarized above. The additional studies were reviewed today:  2D Echo 06/30/2019:  1. The left ventricle has moderate-severely  reduced systolic function, with an ejection fraction of 30-35%. The cavity size was normal. There is mildly increased left ventricular wall thickness. Left ventricular diastolic Doppler parameters are  consistent with pseudonormalization. Left ventricular diffuse hypokinesis.  2. The right ventricle has normal systolic function. The cavity was normal. There is no increase in right ventricular wall thickness. Unable to estimate RVSP.  3. Left atrial size was moderately dilated. __________  Elwyn Reach 10/2018: Normal sinus rhythm avg HR of 81 bpm.  8 Ventricular Tachycardia runs occurred, the run with the fastest interval lasting 4 beats with a max rate of 200 bpm,  the longest lasting 17 beats with an avg rate of 120 bpm.   7 Supraventricular Tachycardia runs occurred,  the run with the fastest interval lasting 5 beats with a max rate of 148 bpm,  the longest lasting 13 beats with an avg rate of 110 bpm.   Isolated SVEs were rare (<1.0%), SVE Couplets were rare (<1.0%), and SVE Triplets were rare (<1.0%).  Isolated VEs were rare (<1.0%), VE Couplets were rare (<1.0%), and no VE Triplets were present. Ventricular Trigeminy was present. __________  Myoview 09/2018: Pharmacological myocardial perfusion imaging study with large region of fixed inferior and inferolateral wall perfusion defect consistent with previous MI Very mild ischemia in the inferolateral wall (peri-infarct ischemia) Hypokinesis noted in the inferior wall.  EF estimated at 36% No EKG changes concerning for ischemia at peak stress or in recovery. Moderate risk scan Similar findings on the study noted in 02/2018 and several years ago.   EKG:  EKG is ordered today.  The EKG ordered today demonstrates NSR, 72 bpm, possible prior inferior and anteroseptal MI, lateral T wave inversion (grossly unchanged from prior)  Recent Labs: 10/19/2018: B Natriuretic Peptide 716.0; BUN 47; Creatinine, Ser 2.92; Hemoglobin 11.2; Platelets 223;  Potassium 5.1; Sodium 136  Recent Lipid Panel No results found for: CHOL, TRIG,  HDL, CHOLHDL, VLDL, LDLCALC, LDLDIRECT  PHYSICAL EXAM:    VS:  BP 140/80 (BP Location: Left Arm, Patient Position: Sitting, Cuff Size: Normal)    Pulse 70    Ht 6\' 1"  (1.854 m)    Wt 242 lb 12 oz (110.1 kg)    BMI 32.03 kg/m   BMI: Body mass index is 32.03 kg/m.  Physical Exam  Constitutional: He is oriented to person, place, and time. He appears well-developed and well-nourished.  HENT:  Head: Normocephalic and atraumatic.  Eyes: Right eye exhibits no discharge. Left eye exhibits no discharge.  Neck: Normal range of motion. No JVD present.  Cardiovascular: Normal rate, regular rhythm, S1 normal, S2 normal and normal heart sounds. Exam reveals no distant heart sounds, no friction rub, no midsystolic click and no opening snap.  No murmur heard. Pulses:      Posterior tibial pulses are 2+ on the right side and 2+ on the left side.  Pulmonary/Chest: Effort normal and breath sounds normal. No respiratory distress. He has no decreased breath sounds. He has no wheezes. He has no rales. He exhibits no tenderness.  Abdominal: Soft. He exhibits no distension. There is no abdominal tenderness.  Musculoskeletal:        General: No edema.  Neurological: He is alert and oriented to person, place, and time.  Skin: Skin is warm and dry. No cyanosis. Nails show no clubbing.  Psychiatric: He has a normal mood and affect. His speech is normal and behavior is normal. Judgment and thought content normal.    Wt Readings from Last 3 Encounters:  07/06/19 242 lb 12 oz (110.1 kg)  01/19/19 266 lb 8 oz (120.9 kg)  11/23/18 263 lb 2 oz (119.4 kg)     ASSESSMENT & PLAN:   1. CAD involving the native coronary status post CABG: He is doing well without any symptoms concerning for angina.  Continue secondary prevention with aspirin, Lipitor, Coreg, and Imdur.  No plans for further ischemic evaluation at this time.  2. HFrEF  secondary to ICM: He is doing well, appears euvolemic, and well compensated.  Patient's weight is down 24 pounds compared to his visit with [place] in 01/2019.  He attributes this to healthier diet and increased physical activity.  Recent renal function from 8/12 in the setting of this weight loss demonstrated stable renal function.  He has not needed any as needed Lasix in approximately 6 months.  Of note, the patient has only been taking Coreg 25 mg in the evening rather than twice daily.  I have instructed him to start taking this medication twice daily and if he notes orthostasis with this he is to contact our office and we will decrease his a.m. dose of carvedilol.  Given the patient's cardiomyopathy and prior NSVT I discussed the importance of this medication with him.  He was recently restarted on losartan by PCP as detailed below with subsequent renal function demonstrating stable serum creatinine and potassium.  However, given the patient's underlying CKD and prior issues with hyperkalemia I do recommend this be followed closely by PCP and nephrology as he declines lab check by me today.  Continue hydralazine and isosorbide.  Given echocardiogram from last week demonstrated persistent cardiomyopathy with an EF of 30 to 35%, and in the setting of the patient's underlying ischemic heart disease with known NSVT he has been referred to EP for rediscussion of ICD.  3. NSVT: Asymptomatic.  Denies any palpitations.  Continue Coreg as outlined above.  Be referred to EP for consideration of ICD given his cardiomyopathy and ischemic heart disease.  4. Hypertensive heart disease: Blood pressure is reasonably controlled today.  Given prior issues with orthostasis no changes were made today.  Continue current regimen as outlined above.  5. Hyperlipidemia: LDL of 63 from 05/2019.  Remains on Lipitor 40 mg daily.  6. CKD stage IV: Patient with previously noted hyperkalemia.  He was recently restarted on losartan on  05/30/2019 by PCP with subsequent follow-up renal function on 06/28/2019 demonstrating stable serum creatinine of 3.0 with an improved potassium of 5.0.  I did offer rechecking this today for the patient to ensure stability, this was declined.  He will follow-up with PCP and nephrology as directed.    7. PVD with poorly controlled diabetes with history of osteomyelitis requiring amputation of the left foot: Recommend close follow-up with PCP, podiatry, and wound clinic as directed.  Disposition: F/u with Dr. Rockey Situ or APP in 3 months.  Patient has been rereferred to EP for further discussion of ICD given persistent cardiomyopathy as outlined above.    Medication Adjustments/Labs and Tests Ordered: Current medicines are reviewed at length with the patient today.  Concerns regarding medicines are outlined above. Medication changes, Labs and Tests ordered today are summarized above and listed in the Patient Instructions accessible in Encounters.   Signed, Christell Faith, PA-C 07/06/2019 4:18 PM     [place] [address] [place], [place] [address] [phone]

## 2019-07-05 ENCOUNTER — Telehealth: Payer: Self-pay | Admitting: Cardiovascular Disease

## 2019-07-05 NOTE — Telephone Encounter (Signed)
Patient awaiting results from Echo preformed on 8/13. Please advise when able

## 2019-07-05 NOTE — Telephone Encounter (Signed)
Patient message sent. Dr.Gollan will send final review and we will call pt back with echo results.

## 2019-07-06 ENCOUNTER — Encounter: Payer: Self-pay | Admitting: Physician Assistant

## 2019-07-06 ENCOUNTER — Ambulatory Visit (INDEPENDENT_AMBULATORY_CARE_PROVIDER_SITE_OTHER): Payer: Medicare HMO | Admitting: Physician Assistant

## 2019-07-06 ENCOUNTER — Other Ambulatory Visit: Payer: Self-pay

## 2019-07-06 VITALS — BP 140/80 | HR 70 | Ht 73.0 in | Wt 242.8 lb

## 2019-07-06 DIAGNOSIS — N184 Chronic kidney disease, stage 4 (severe): Secondary | ICD-10-CM | POA: Diagnosis not present

## 2019-07-06 DIAGNOSIS — E785 Hyperlipidemia, unspecified: Secondary | ICD-10-CM

## 2019-07-06 DIAGNOSIS — I4729 Other ventricular tachycardia: Secondary | ICD-10-CM

## 2019-07-06 DIAGNOSIS — E118 Type 2 diabetes mellitus with unspecified complications: Secondary | ICD-10-CM

## 2019-07-06 DIAGNOSIS — Z951 Presence of aortocoronary bypass graft: Secondary | ICD-10-CM | POA: Diagnosis not present

## 2019-07-06 DIAGNOSIS — I2510 Atherosclerotic heart disease of native coronary artery without angina pectoris: Secondary | ICD-10-CM

## 2019-07-06 DIAGNOSIS — I5022 Chronic systolic (congestive) heart failure: Secondary | ICD-10-CM | POA: Diagnosis not present

## 2019-07-06 DIAGNOSIS — I472 Ventricular tachycardia: Secondary | ICD-10-CM

## 2019-07-06 DIAGNOSIS — I255 Ischemic cardiomyopathy: Secondary | ICD-10-CM

## 2019-07-06 MED ORDER — HYDRALAZINE HCL 25 MG PO TABS: 25.0000 mg | ORAL_TABLET | Freq: Two times a day (BID) | ORAL | 3 refills | Status: DC

## 2019-07-06 MED ORDER — ISOSORBIDE MONONITRATE ER 60 MG PO TB24: 30.0000 mg | ORAL_TABLET | Freq: Every day | ORAL | 3 refills | Status: DC

## 2019-07-06 NOTE — Patient Instructions (Signed)
Medication Instructions:  1- DECREASE Hydralazine Take 1 tablet (25 mg total) by mouth 2 (two) times daily. 2- DECREASE Imdur Take 0.5 tablets (30 mg total) by mouth daily If you need a refill on your cardiac medications before your next appointment, please call your pharmacy.   Lab work: None ordered  If you have labs (blood work) drawn today and your tests are completely normal, you will receive your results only by: Marland Kitchen MyChart Message (if you have MyChart) OR . A paper copy in the mail If you have any lab test that is abnormal or we need to change your treatment, we will call you to review the results.  Testing/Procedures: None ordered   Follow-Up: At [facility], you and your health needs are our priority.  As part of our continuing mission to provide you with exceptional heart care, we have created designated Provider Care Teams.  These Care Teams include your primary Cardiologist (physician) and Advanced Practice Providers (APPs -  Physician Assistants and Nurse Practitioners) who all work together to provide you with the care you need, when you need it. . You will need a follow up appointment in 3 months.  You may see Ida Rogue, MD or Christell Faith, PA-C.   Any Other Special Instructions Will Be Listed Below (If Applicable). Please make follow up appt with Dr. Caryl Comes to discuss ICD placement.

## 2019-07-10 ENCOUNTER — Other Ambulatory Visit: Payer: Self-pay

## 2019-07-10 ENCOUNTER — Inpatient Hospital Stay
Admission: EM | Admit: 2019-07-10 | Discharge: 2019-07-13 | DRG: 683 | Disposition: A | Discharge status: Home / Self Care | Payer: Medicare HMO | Attending: Specialist | Admitting: Specialist

## 2019-07-10 DIAGNOSIS — Z79899 Other long term (current) drug therapy: Secondary | ICD-10-CM | POA: Diagnosis not present

## 2019-07-10 DIAGNOSIS — E1151 Type 2 diabetes mellitus with diabetic peripheral angiopathy without gangrene: Secondary | ICD-10-CM | POA: Diagnosis not present

## 2019-07-10 DIAGNOSIS — N189 Chronic kidney disease, unspecified: Secondary | ICD-10-CM | POA: Diagnosis not present

## 2019-07-10 DIAGNOSIS — Z951 Presence of aortocoronary bypass graft: Secondary | ICD-10-CM

## 2019-07-10 DIAGNOSIS — I130 Hypertensive heart and chronic kidney disease with heart failure and stage 1 through stage 4 chronic kidney disease, or unspecified chronic kidney disease: Secondary | ICD-10-CM | POA: Diagnosis not present

## 2019-07-10 DIAGNOSIS — Z7982 Long term (current) use of aspirin: Secondary | ICD-10-CM | POA: Diagnosis not present

## 2019-07-10 DIAGNOSIS — Z20828 Contact with and (suspected) exposure to other viral communicable diseases: Secondary | ICD-10-CM | POA: Diagnosis present

## 2019-07-10 DIAGNOSIS — Z841 Family history of disorders of kidney and ureter: Secondary | ICD-10-CM | POA: Diagnosis not present

## 2019-07-10 DIAGNOSIS — E1122 Type 2 diabetes mellitus with diabetic chronic kidney disease: Secondary | ICD-10-CM | POA: Diagnosis not present

## 2019-07-10 DIAGNOSIS — Z6834 Body mass index (BMI) 34.0-34.9, adult: Secondary | ICD-10-CM

## 2019-07-10 DIAGNOSIS — Z8249 Family history of ischemic heart disease and other diseases of the circulatory system: Secondary | ICD-10-CM | POA: Diagnosis not present

## 2019-07-10 DIAGNOSIS — Z833 Family history of diabetes mellitus: Secondary | ICD-10-CM | POA: Diagnosis not present

## 2019-07-10 DIAGNOSIS — E118 Type 2 diabetes mellitus with unspecified complications: Secondary | ICD-10-CM

## 2019-07-10 DIAGNOSIS — A09 Infectious gastroenteritis and colitis, unspecified: Secondary | ICD-10-CM | POA: Diagnosis not present

## 2019-07-10 DIAGNOSIS — E1142 Type 2 diabetes mellitus with diabetic polyneuropathy: Secondary | ICD-10-CM | POA: Diagnosis present

## 2019-07-10 DIAGNOSIS — E860 Dehydration: Secondary | ICD-10-CM | POA: Diagnosis present

## 2019-07-10 DIAGNOSIS — I252 Old myocardial infarction: Secondary | ICD-10-CM | POA: Diagnosis not present

## 2019-07-10 DIAGNOSIS — E669 Obesity, unspecified: Secondary | ICD-10-CM | POA: Diagnosis present

## 2019-07-10 DIAGNOSIS — I255 Ischemic cardiomyopathy: Secondary | ICD-10-CM | POA: Diagnosis present

## 2019-07-10 DIAGNOSIS — I129 Hypertensive chronic kidney disease with stage 1 through stage 4 chronic kidney disease, or unspecified chronic kidney disease: Secondary | ICD-10-CM | POA: Diagnosis not present

## 2019-07-10 DIAGNOSIS — Z89422 Acquired absence of other left toe(s): Secondary | ICD-10-CM

## 2019-07-10 DIAGNOSIS — N184 Chronic kidney disease, stage 4 (severe): Secondary | ICD-10-CM | POA: Diagnosis not present

## 2019-07-10 DIAGNOSIS — E785 Hyperlipidemia, unspecified: Secondary | ICD-10-CM | POA: Diagnosis present

## 2019-07-10 DIAGNOSIS — I5022 Chronic systolic (congestive) heart failure: Secondary | ICD-10-CM | POA: Diagnosis present

## 2019-07-10 DIAGNOSIS — I2510 Atherosclerotic heart disease of native coronary artery without angina pectoris: Secondary | ICD-10-CM | POA: Diagnosis present

## 2019-07-10 DIAGNOSIS — R197 Diarrhea, unspecified: Secondary | ICD-10-CM | POA: Diagnosis not present

## 2019-07-10 DIAGNOSIS — N179 Acute kidney failure, unspecified: Principal | ICD-10-CM | POA: Diagnosis present

## 2019-07-10 DIAGNOSIS — Z794 Long term (current) use of insulin: Secondary | ICD-10-CM | POA: Diagnosis not present

## 2019-07-10 DIAGNOSIS — Z03818 Encounter for observation for suspected exposure to other biological agents ruled out: Secondary | ICD-10-CM | POA: Diagnosis not present

## 2019-07-10 LAB — CBC
HCT: 34.6 % — ABNORMAL LOW (ref 39.0–52.0)
Hemoglobin: 11.2 g/dL — ABNORMAL LOW (ref 13.0–17.0)
MCH: 30.8 pg (ref 26.0–34.0)
MCHC: 32.4 g/dL (ref 30.0–36.0)
MCV: 95.1 fL (ref 80.0–100.0)
Platelets: 171 10*3/uL (ref 150–400)
RBC: 3.64 MIL/uL — ABNORMAL LOW (ref 4.22–5.81)
RDW: 13 % (ref 11.5–15.5)
WBC: 8 10*3/uL (ref 4.0–10.5)
nRBC: 0 % (ref 0.0–0.2)

## 2019-07-10 LAB — COMPREHENSIVE METABOLIC PANEL
ALT: 26 U/L (ref 0–44)
AST: 25 U/L (ref 15–41)
Albumin: 3.4 g/dL — ABNORMAL LOW (ref 3.5–5.0)
Alkaline Phosphatase: 52 U/L (ref 38–126)
Anion gap: 6 (ref 5–15)
BUN: 77 mg/dL — ABNORMAL HIGH (ref 6–20)
CO2: 14 mmol/L — ABNORMAL LOW (ref 22–32)
Calcium: 8 mg/dL — ABNORMAL LOW (ref 8.9–10.3)
Chloride: 119 mmol/L — ABNORMAL HIGH (ref 98–111)
Creatinine, Ser: 4.52 mg/dL — ABNORMAL HIGH (ref 0.61–1.24)
GFR calc Af Amer: 16 mL/min — ABNORMAL LOW (ref >60)
GFR calc non Af Amer: 14 mL/min — ABNORMAL LOW (ref >60)
Glucose, Bld: 91 mg/dL (ref 70–99)
Potassium: 4.8 mmol/L (ref 3.5–5.1)
Sodium: 139 mmol/L (ref 135–145)
Total Bilirubin: 0.7 mg/dL (ref 0.3–1.2)
Total Protein: 6.7 g/dL (ref 6.5–8.1)

## 2019-07-10 LAB — LIPASE, BLOOD: Lipase: 122 U/L — ABNORMAL HIGH (ref 11–51)

## 2019-07-10 MED ORDER — ISOSORBIDE MONONITRATE ER 30 MG PO TB24
30.0000 mg | ORAL_TABLET | Freq: Every day | ORAL | Status: DC
  Administered 2019-07-10 – 2019-07-13 (×4): 30 mg via ORAL
  Filled 2019-07-10 (×4): qty 1

## 2019-07-10 MED ORDER — HEPARIN SODIUM (PORCINE) 5000 UNIT/ML IJ SOLN
5000.0000 [IU] | Freq: Three times a day (TID) | INTRAMUSCULAR | Status: DC
  Administered 2019-07-10 – 2019-07-13 (×7): 5000 [IU] via SUBCUTANEOUS
  Filled 2019-07-10 (×8): qty 1

## 2019-07-10 MED ORDER — SODIUM CHLORIDE 0.9 % IV SOLN
1000.0000 mL | Freq: Once | INTRAVENOUS | Status: AC
  Administered 2019-07-10: 1000 mL via INTRAVENOUS

## 2019-07-10 MED ORDER — ASPIRIN EC 81 MG PO TBEC
81.0000 mg | DELAYED_RELEASE_TABLET | Freq: Every day | ORAL | Status: DC
  Administered 2019-07-10 – 2019-07-13 (×4): 81 mg via ORAL
  Filled 2019-07-10 (×4): qty 1

## 2019-07-10 MED ORDER — SODIUM BICARBONATE 650 MG PO TABS
1300.0000 mg | ORAL_TABLET | Freq: Two times a day (BID) | ORAL | Status: DC
  Administered 2019-07-10 – 2019-07-13 (×6): 1300 mg via ORAL
  Filled 2019-07-10 (×6): qty 2

## 2019-07-10 MED ORDER — SODIUM CHLORIDE 0.9% FLUSH: 3.0000 mL | Freq: Once | INTRAVENOUS | Status: DC

## 2019-07-10 MED ORDER — FERROUS SULFATE 325 (65 FE) MG PO TABS
325.0000 mg | ORAL_TABLET | Freq: Every day | ORAL | Status: DC
  Administered 2019-07-10 – 2019-07-13 (×4): 325 mg via ORAL
  Filled 2019-07-10 (×4): qty 1

## 2019-07-10 MED ORDER — ATORVASTATIN CALCIUM 20 MG PO TABS
40.0000 mg | ORAL_TABLET | Freq: Every day | ORAL | Status: DC
  Administered 2019-07-10 – 2019-07-12 (×3): 40 mg via ORAL
  Filled 2019-07-10 (×3): qty 2

## 2019-07-10 MED ORDER — NITROGLYCERIN 0.4 MG SL SUBL: 0.4000 mg | SUBLINGUAL_TABLET | SUBLINGUAL | Status: DC | PRN

## 2019-07-10 MED ORDER — CARVEDILOL 25 MG PO TABS
25.0000 mg | ORAL_TABLET | Freq: Two times a day (BID) | ORAL | Status: DC
  Administered 2019-07-10 – 2019-07-13 (×6): 25 mg via ORAL
  Filled 2019-07-10 (×6): qty 1

## 2019-07-10 MED ORDER — SODIUM CHLORIDE 0.9 % IV SOLN
INTRAVENOUS | Status: DC
  Administered 2019-07-10 – 2019-07-13 (×5): via INTRAVENOUS

## 2019-07-10 MED ORDER — HYDROCODONE-ACETAMINOPHEN 5-325 MG PO TABS: 1.0000 | ORAL_TABLET | ORAL | Status: DC | PRN

## 2019-07-10 MED ORDER — HYDRALAZINE HCL 25 MG PO TABS
25.0000 mg | ORAL_TABLET | Freq: Two times a day (BID) | ORAL | Status: DC
  Administered 2019-07-10 – 2019-07-13 (×6): 25 mg via ORAL
  Filled 2019-07-10 (×6): qty 1

## 2019-07-10 NOTE — Progress Notes (Signed)
Family Meeting Note  Advance Directive:yes  Today a meeting took place with the Patient.  The following clinical team members were present during this meeting:MD  The following were discussed:Patient's diagnosis: Diarrhea, acute kidney injury on chronic kidney disease with a history of cardiomyopathy ejection fraction 30 to 35%, elevated lipase, hypertension diabetes mellitus will be admitted.  Patient will be provided with IV fluids and check his stool for GI panel and C. difficile toxin.  Will consult nephrology and repeat a.m. labs including lipase.  Treatment plan of care discussed in detail with the patient.  He verbalized understanding of the plan.    Patient's progosis: Unable to determine and Goals for treatment: Full Code wife is healthcare power of attorney  Additional follow-up to be provided: Hospitalist and nephrology  Time spent during discussion:17 min  Nicholes Mango, MD

## 2019-07-10 NOTE — ED Notes (Signed)
Informed Charity fundraiser of pt's arrival to room 227

## 2019-07-10 NOTE — ED Triage Notes (Signed)
Pt comes via POV from home with c/o diarrhea and possible dehydration. Pt states he has had diarrhea for about 5 days.  Pt denies any N/V, abdominal pain, CP and SOB.

## 2019-07-10 NOTE — H&P (Signed)
[place] [facility] [place] NAME: [name] [name]    MR#:  [id number]  DATE OF BIRTH:  [birth year]  DATE OF ADMISSION:  07/10/2019  PRIMARY CARE PHYSICIAN: Tracie Harrier, MD   REQUESTING/REFERRING PHYSICIAN: Lavonia Drafts MD  CHIEF COMPLAINT:   Chief Complaint  Patient presents with   Diarrhea    HISTORY OF PRESENT ILLNESS:   58 year old male with past medical history of CAD s/p 4-vessel CABG in 04/2014 with a LIMA to LAD, SVG to diagonal, SVG to OM, and SVG to PDA, HFrEF secondary to ICM, NSVT, paroxysmal SVT, PVD,poorly controlledinsulin-dependentdiabetes with diabetic foot ulcers and diabetic peripheral neuropathyrequiring amputations of the left toe/foot,osteomyelitis,CKD stage IV with prior hyperkalemia, HTN, HLD, asymptomaticorthostatic hypotension, and medication compliance issues secondary to finances presenting to the ED with chief complaints of diarrhea and dehydration x 5 days.  Patient states symptoms started about 5 to 6 days ago without associated symptoms of nausea vomiting, fevers or chills, blood in the stool, abdominal pain, shortness of breath or any other associated GI symptoms.  Patient denies recent medication changes or antibiotics use.  No recent sick contacts.  He called his PCP today with concerns of dehydration and he was advised to go to the ED for further evaluation.  On arrival to the ED, he was afebrile with blood pressure 137/72 mm Hg and pulse rate 75 beats/min. There were no focal neurological deficits; he was alert and oriented x4.  Patient labs revealed slightly elevated lipase 122, BUN 77, creatinine 4.52 above baseline of 47 and 2.9.,  CBC unremarkable.  Given abnormal renal function concerning for dehydration from diarrhea patient will be admitted under hospitalist service for observation and IVFs hydration.  PAST MEDICAL HISTORY:   Past Medical History:  Diagnosis Date   CAD (coronary artery  disease)    a. 04/2014 CABG x 4 (LIMA->LAD, VG->Diag, VG->OM, VG->PDA).   Chronic systolic CHF (congestive heart failure) ([place])    a. 07/2014 Echo: EF 30-35%.   CKD (chronic kidney disease), stage III ([place])    a. 12/2015 Creat 1.8.   Diabetic foot ulcers ([place])    a. left foot 2nd digit ant 5 th digit 05/03/14; b. 08/2014 s/p amputation of toes on L foot.   Hypercholesterolemia    a. 12/2015 TC 116, TG 154, HDL 24, LDL 61-->Atorvastatin 40.   Hypertension    Hypertensive heart disease    Ischemic cardiomyopathy    a. 07/2013 Echo: EF 20%; b. 07/2014 Echo: EF 30-35%, diff HK, Gr1 DD, mildly dil LA, nl PASP.   Myocardial infarction ([place])    Neuropathy in diabetes [facility])    Open wound    foot   Orthostatic hypotension    Peripheral vascular disease ([place])    Type II diabetes mellitus ([place])    a. 12/2015 HbA1c = 13.9.    PAST SURGICAL HISTORY:   Past Surgical History:  Procedure Laterality Date   ACHILLES TENDON SURGERY Right 01/22/2017   Procedure: ACHILLES LENGTHENING/KIDNER/TAL/Teno achilles lengthening;  Surgeon: Samara Deist, DPM;  Location: [facility] ORS;  Service: Podiatry;  Laterality: Right;   CARDIAC CATHETERIZATION     03/2014   CATARACT EXTRACTION     CORONARY ARTERY BYPASS GRAFT N/A 05/07/2014   Procedure: CORONARY ARTERY BYPASS GRAFTING (CABG);  Surgeon: Gaye Pollack, MD;  Location: [facility];  Service: Open Heart Surgery;  Laterality: N/A;  Times 4 using left internal mammary artery and endoscopically harvested right saphenous vein   INTRAOPERATIVE  TRANSESOPHAGEAL ECHOCARDIOGRAM N/A 05/07/2014   Procedure: INTRAOPERATIVE TRANSESOPHAGEAL ECHOCARDIOGRAM;  Surgeon: Gaye Pollack, MD;  Location: [facility];  Service: Open Heart Surgery;  Laterality: N/A;   left foot osteomyelitis and wound after surgery     TOE AMPUTATION     Left 3 and 4 toes   TONSILECTOMY/ADENOIDECTOMY WITH MYRINGOTOMY      SOCIAL HISTORY:   Social History   Tobacco Use   Smoking status: Never  Smoker   Smokeless tobacco: Never Used  Substance Use Topics   Alcohol use: No    FAMILY HISTORY:   Family History  Problem Relation Age of Onset   Heart disease Father        CABG in his 64's   Diabetes type II Other    Kidney disease Other    Hypertension Other    Ovarian cancer Mother     DRUG ALLERGIES:   Allergies  Allergen Reactions   Other Other (See Comments)    Pt. And wife state that he had a reaction to "some" antibiotic but they do not know what the name of it was.They are are very apprehensive about him receiving any antibiotic. Zosyn, Clindamycin, & Vancomycin (all started together unsure which caused reaction 07/18/2013)    REVIEW OF SYSTEMS:   Review of Systems  Constitutional: Negative for chills, fever, malaise/fatigue and weight loss.  HENT: Negative for congestion, hearing loss and sore throat.   Eyes: Negative for blurred vision and double vision.  Respiratory: Negative for cough, shortness of breath and wheezing.   Cardiovascular: Negative for chest pain, palpitations, orthopnea and leg swelling.  Gastrointestinal: Positive for diarrhea. Negative for abdominal pain, nausea and vomiting.  Genitourinary: Negative for dysuria and urgency.  Musculoskeletal: Negative for myalgias.  Skin: Negative for rash.  Neurological: Negative for dizziness, sensory change, speech change, focal weakness and headaches.  Psychiatric/Behavioral: Negative for depression.   MEDICATIONS AT HOME:   Prior to Admission medications   Medication Sig Start Date End Date Taking? Authorizing Provider  aspirin EC 81 MG tablet Take 81 mg by mouth daily.    [provider]  atorvastatin (LIPITOR) 40 MG tablet Take 1 tablet (40 mg total) by mouth at bedtime. 11/23/18   Minna Merritts, MD  carvedilol (COREG) 25 MG tablet Take 1 tablet (25 mg total) by mouth 2 (two) times daily with a meal. 11/23/18   Gollan, Kathlene November, MD  Cyanocobalamin (VITAMIN B 12 PO) Take by mouth  daily.    [provider]  ferrous sulfate 325 (65 FE) MG tablet Take 325 mg by mouth daily.    [provider]  furosemide (LASIX) 20 MG tablet Take 20 mg by mouth daily as needed.    [provider]  glimepiride (AMARYL) 4 MG tablet Take 4 mg by mouth 2 (two) times daily with a meal.  11/20/16   [provider]  hydrALAZINE (APRESOLINE) 25 MG tablet Take 1 tablet (25 mg total) by mouth 2 (two) times daily. 07/06/19   Rise Mu, PA-C  HYDROcodone-acetaminophen (NORCO/VICODIN) 5-325 MG tablet Take by mouth as needed. 05/30/19   [provider]  insulin aspart (NOVOLOG FLEXPEN) 100 UNIT/ML FlexPen Inject 0-16 Units into the skin 3 (three) times daily with meals. Sliding scale insulin    [provider]  isosorbide mononitrate (IMDUR) 60 MG 24 hr tablet Take 0.5 tablets (30 mg total) by mouth daily. 07/06/19   Rise Mu, PA-C  losartan (COZAAR) 25 MG tablet Take by  mouth daily. 05/30/19 05/29/20  [provider]  nitroGLYCERIN (NITROSTAT) 0.4 MG SL tablet Place 1 tablet (0.4 mg total) under the tongue every 5 (five) minutes as needed for chest pain. 12/24/15   Theora Gianotti, NP  sodium bicarbonate 650 MG tablet 2 (two) times daily. 03/01/19   [provider]      VITAL SIGNS:  Blood pressure 137/72, pulse 75, temperature 97.7 F (36.5 C), temperature source Oral, resp. rate 18, height 6\' 1"  (1.854 m), weight 117.5 kg, SpO2 99 %.  PHYSICAL EXAMINATION:   Physical Exam  GENERAL:  58 y.o.-year-old patient lying in the bed with no acute distress.  EYES: Pupils equal, round, reactive to light and accommodation. No scleral icterus. Extraocular muscles intact.  HEENT: Head atraumatic, normocephalic. Oropharynx and nasopharynx clear.  NECK:  Supple, no jugular venous distention. No thyroid enlargement, no tenderness.  LUNGS: Normal breath sounds bilaterally, no wheezing, rales,rhonchi or crepitation. No use of accessory  muscles of respiration.  CARDIOVASCULAR: S1, S2 normal. No murmurs, rubs, or gallops.  ABDOMEN: Soft, nontender, nondistended. Bowel sounds present. No organomegaly or mass.  EXTREMITIES: No pedal edema, cyanosis, or clubbing. No rash or lesions. + pedal pulses MUSCULOSKELETAL: Normal bulk, and power was 5+ grip and elbow, knee, and ankle flexion and extension bilaterally.  NEUROLOGIC:Alert and oriented x 3. CN 2-12 intact. Sensation to light touch and cold stimuli intact bilaterally. DTR's (biceps, patellar, and achilles) 2+ and symmetric throughout. Gait not tested due to safety concern. PSYCHIATRIC: The patient is alert and oriented x 3.  SKIN: No obvious rash, lesion, or ulcer.   DATA REVIEWED:  LABORATORY PANEL:   CBC Recent Labs  Lab 07/10/19 1438  WBC 8.0  HGB 11.2*  HCT 34.6*  PLT 171   ------------------------------------------------------------------------------------------------------------------  Chemistries  Recent Labs  Lab 07/10/19 1438  NA 139  K 4.8  CL 119*  CO2 14*  GLUCOSE 91  BUN 77*  CREATININE 4.52*  CALCIUM 8.0*  AST 25  ALT 26  ALKPHOS 52  BILITOT 0.7   ------------------------------------------------------------------------------------------------------------------  Cardiac Enzymes No results for input(s): TROPONINI in the last 168 hours. ------------------------------------------------------------------------------------------------------------------  RADIOLOGY:  No results found.  EKG:  EKG: normal EKG, normal sinus rhythm, unchanged from previous tracings.  IMPRESSION AND PLAN:   58 y.o. male medical history of CAD s/p 4-vessel CABG in 04/2014 with a LIMA to LAD, SVG to diagonal, SVG to OM, and SVG to PDA, HFrEF secondary to ICM, NSVT, paroxysmal SVT, PVD,poorly controlledinsulin-dependentdiabetes with diabetic foot ulcers and diabetic peripheral neuropathyrequiring amputations of the left toe/foot,osteomyelitis,CKD stage IV  with prior hyperkalemia, HTN, HLD, asymptomaticorthostatic hypotension, and medication compliance issues secondary to finances presenting to the ED with chief complaints of diarrhea and dehydration x 5 days.   1. Acute onset Diarrhea - Unknown etiology - Admit for observation - Check GI panel - Check C. Difficile - IVFs  2.  Acute renal failure superimposed on CKD stage IV-AKI likely secondary to dehydration - BUN/creatinine above baseline - IVF hydration - Monitor renal function  3. HFrEF secondary to ischemic cardiomyopathy - Last echo 06/2019 LVEF of 30 to 35% -*PRN Lasix will hold due to AKI - Continue Coreg - Hold losartan in the setting of AKI  4. Coronary Artery Disease involving native coronary s/p CABG - ASA 81mg  PO daily - Coreg and Imdur - HTN, HLD, DM control as below  5. HLD  + Goal LDL<100 - Atorvastatin 40mg  PO qhs  6. HTN  + Goal BP <  130/80 -Hold losartan in the setting of AKI -Continue Coreg and hydralazine.  7. Diabetes Mellitus Type 2 with complications - Hold glimepiride - CBG monitoring - SSI - DM education and close PCP follow up  8. PVD with poorly controlled diabetes and history of osteomyelitis s/p amputation of the left foot   All the records are reviewed and case discussed with ED provider. Management plans discussed with the patient, family and they are in agreement.  CODE STATUS: FULL  TOTAL TIME TAKING CARE OF THIS PATIENT: 50 minutes.    on 07/10/2019 at [place], DNP, FNP-BC [facility] Nurse Practitioner Between 7am to 6pm - Pager [phone]  After 6pm go to [email or web address] - password [facility]  Office  [phone]  CC: Primary care physician; Tracie Harrier, MD

## 2019-07-10 NOTE — ED Notes (Signed)
ED TO INPATIENT HANDOFF REPORT  ED Nurse Name and Phone #: Geradine Girt  S Name/Age/Gender [name] [name] 58 y.o. male Room/Bed: ED35A/ED35A  Code Status   Code Status: Prior  Home/SNF/Other Home Patient oriented to: self, place, time and situation Is this baseline? Yes   Triage Complete: Triage complete  Chief Complaint diarrhea  Triage Note Pt comes via POV from home with c/o diarrhea and possible dehydration. Pt states he has had diarrhea for about 5 days.  Pt denies any N/V, abdominal pain, CP and SOB.   Allergies Allergies  Allergen Reactions  . Other Other (See Comments)    Pt. And wife state that he had a reaction to "some" antibiotic but they do not know what the name of it was.They are are very apprehensive about him receiving any antibiotic. Zosyn, Clindamycin, & Vancomycin (all started together unsure which caused reaction 07/18/2013)    Level of Care/Admitting Diagnosis ED Disposition    ED Disposition Condition Comment   Admit  The patient appears reasonably stabilized for admission considering the current resources, flow, and capabilities available in the ED at this time, and I doubt any other [facility] requiring further screening and/or treatment in the ED prior to admission is  present.       B Medical/Surgery History Past Medical History:  Diagnosis Date  . CAD (coronary artery disease)    a. 04/2014 CABG x 4 (LIMA->LAD, VG->Diag, VG->OM, VG->PDA).  . Chronic systolic CHF (congestive heart failure) ([place])    a. 07/2014 Echo: EF 30-35%.  . CKD (chronic kidney disease), stage III ([place])    a. 12/2015 Creat 1.8.  . Diabetic foot ulcers ([place])    a. left foot 2nd digit ant 5 th digit 05/03/14; b. 08/2014 s/p amputation of toes on L foot.  . Hypercholesterolemia    a. 12/2015 TC 116, TG 154, HDL 24, LDL 61-->Atorvastatin 40.  Marland Kitchen Hypertension   . Hypertensive heart disease   . Ischemic cardiomyopathy    a. 07/2013 Echo: EF 20%; b. 07/2014 Echo: EF 30-35%, diff  HK, Gr1 DD, mildly dil LA, nl PASP.  Marland Kitchen Myocardial infarction ([place])   . Neuropathy in diabetes ([place])   . Open wound    foot  . Orthostatic hypotension   . Peripheral vascular disease ([place])   . Type II diabetes mellitus ([place])    a. 12/2015 HbA1c = 13.9.   Past Surgical History:  Procedure Laterality Date  . ACHILLES TENDON SURGERY Right 01/22/2017   Procedure: ACHILLES LENGTHENING/KIDNER/TAL/Teno achilles lengthening;  Surgeon: Samara Deist, DPM;  Location: [facility] ORS;  Service: Podiatry;  Laterality: Right;  . CARDIAC CATHETERIZATION     03/2014  . CATARACT EXTRACTION    . CORONARY ARTERY BYPASS GRAFT N/A 05/07/2014   Procedure: CORONARY ARTERY BYPASS GRAFTING (CABG);  Surgeon: Gaye Pollack, MD;  Location: [facility];  Service: Open Heart Surgery;  Laterality: N/A;  Times 4 using left internal mammary artery and endoscopically harvested right saphenous vein  . INTRAOPERATIVE TRANSESOPHAGEAL ECHOCARDIOGRAM N/A 05/07/2014   Procedure: INTRAOPERATIVE TRANSESOPHAGEAL ECHOCARDIOGRAM;  Surgeon: Gaye Pollack, MD;  Location: [facility] OR;  Service: Open Heart Surgery;  Laterality: N/A;  . left foot osteomyelitis and wound after surgery    . TOE AMPUTATION     Left 3 and 4 toes  . TONSILECTOMY/ADENOIDECTOMY WITH MYRINGOTOMY       A IV Location/Drains/Wounds Patient Lines/Drains/Airways Status   Active Line/Drains/Airways    Name:   Placement date:   Placement time:  Site:   Days:   Peripheral IV 10/04/18 Right Antecubital   10/04/18    1749    Antecubital   279   Peripheral IV 07/10/19 Left Hand   07/10/19    1548    Hand   less than 1   Incision - 2 Ports Abdomen Medial Medial   05/15/14    -     1882   Wound / Incision (Open or Dehisced) Diabetic ulcer Toe (Comment  which one) Left Diabetic ulcers on toes of left foot   -    -    Toe (Comment  which one)             Intake/Output Last 24 hours No intake or output data in the 24 hours ending 07/10/19 1636  Labs/Imaging Results for orders placed  or performed during the hospital encounter of 07/10/19 (from the past 48 hour(s))  Lipase, blood     Status: Abnormal   Collection Time: 07/10/19  2:38 PM  Result Value Ref Range   Lipase 122 (H) 11 - 51 U/L    Comment: Performed at [facility], [address] [place], [place] [address]  Comprehensive metabolic panel     Status: Abnormal   Collection Time: 07/10/19  2:38 PM  Result Value Ref Range   Sodium 139 135 - 145 mmol/L   Potassium 4.8 3.5 - 5.1 mmol/L   Chloride 119 (H) 98 - 111 mmol/L   CO2 14 (L) 22 - 32 mmol/L   Glucose, Bld 91 70 - 99 mg/dL   BUN 77 (H) 6 - 20 mg/dL   Creatinine, Ser 4.52 (H) 0.61 - 1.24 mg/dL   Calcium 8.0 (L) 8.9 - 10.3 mg/dL   Total Protein 6.7 6.5 - 8.1 g/dL   Albumin 3.4 (L) 3.5 - 5.0 g/dL   AST 25 15 - 41 U/L   ALT 26 0 - 44 U/L   Alkaline Phosphatase 52 38 - 126 U/L   Total Bilirubin 0.7 0.3 - 1.2 mg/dL   GFR calc non Af Amer 14 (L) >60 mL/min   GFR calc Af Amer 16 (L) >60 mL/min   Anion gap 6 5 - 15    Comment: Performed at [facility], [address] [place], [place] [address]  CBC     Status: Abnormal   Collection Time: 07/10/19  2:38 PM  Result Value Ref Range   WBC 8.0 4.0 - 10.5 K/uL   RBC 3.64 (L) 4.22 - 5.81 MIL/uL   Hemoglobin 11.2 (L) 13.0 - 17.0 g/dL   HCT 34.6 (L) 39.0 - 52.0 %   MCV 95.1 80.0 - 100.0 fL   MCH 30.8 26.0 - 34.0 pg   MCHC 32.4 30.0 - 36.0 g/dL   RDW 13.0 11.5 - 15.5 %   Platelets 171 150 - 400 K/uL   nRBC 0.0 0.0 - 0.2 %    Comment: Performed at [facility], [address] [place], [place] [address]   No results found.  Pending Labs [employer] (From admission, onward)    Start     Ordered   07/10/19 1549  SARS CORONAVIRUS 2 Nasal Swab Aptima Multi Swab  (Asymptomatic/Tier 2 Patients Labs)  Once,   STAT    Question Answer Comment  Is this test for diagnosis or screening Screening   Symptomatic for COVID-19 as defined by CDC No   Hospitalized for COVID-19 No    Admitted to ICU for COVID-19 No  Previously tested for COVID-19 No   Resident in a congregate (group) care setting No   Employed in healthcare setting No      07/10/19 1548   07/10/19 1437  Urinalysis, Complete w Microscopic  ONCE - STAT,   STAT     07/10/19 1436          Vitals/Pain Today's Vitals   07/10/19 1434 07/10/19 1435  BP: 137/72   Pulse: 75   Resp: 18   Temp: 97.7 F (36.5 C)   TempSrc: Oral   SpO2: 99%   Weight:  117.5 kg  Height:  6\' 1"  (1.854 m)  PainSc:  0-No pain    Isolation Precautions No active isolations  Medications Medications  sodium chloride flush (NS) 0.9 % injection 3 mL (has no administration in time range)  0.9 %  sodium chloride infusion (1,000 mLs Intravenous New Bag/Given 07/10/19 1551)    Mobility walks Low fall risk   Focused Assessments GI   R Recommendations: See Admitting Provider Note  Report given to:   Additional Notes:

## 2019-07-10 NOTE — ED Notes (Signed)
No sandwich trays at this time. Dining has been called to bring more. PT aware and okay to wait.

## 2019-07-10 NOTE — ED Provider Notes (Signed)
[facility] Emergency Department Provider Note   ____________________________________________    I have reviewed the triage vital signs and the nursing notes.   HISTORY  Chief Complaint Diarrhea     HPI [name] [name] is a 58 y.o. male with significant past medical history as detailed below who presents with complaints of dehydration.  Patient reports he has had diarrhea over the last 5 days which has been significant at times.  He reports he feels like it is improving slightly today however sent by PCP for evaluation for possible dehydration.  Does have a history of chronic kidney disease with a baseline creatinine of around 3.  Does have diabetes.  Denies fevers or chills.  No nausea or vomiting.  No abdominal pain.  Past Medical History:  Diagnosis Date  . CAD (coronary artery disease)    a. 04/2014 CABG x 4 (LIMA->LAD, VG->Diag, VG->OM, VG->PDA).  . Chronic systolic CHF (congestive heart failure) ([place])    a. 07/2014 Echo: EF 30-35%.  . CKD (chronic kidney disease), stage III ([place])    a. 12/2015 Creat 1.8.  . Diabetic foot ulcers ([place])    a. left foot 2nd digit ant 5 th digit 05/03/14; b. 08/2014 s/p amputation of toes on L foot.  . Hypercholesterolemia    a. 12/2015 TC 116, TG 154, HDL 24, LDL 61-->Atorvastatin 40.  Marland Kitchen Hypertension   . Hypertensive heart disease   . Ischemic cardiomyopathy    a. 07/2013 Echo: EF 20%; b. 07/2014 Echo: EF 30-35%, diff HK, Gr1 DD, mildly dil LA, nl PASP.  Marland Kitchen Myocardial infarction ([place])   . Neuropathy in diabetes ([place])   . Open wound    foot  . Orthostatic hypotension   . Peripheral vascular disease ([place])   . Type II diabetes mellitus ([place])    a. 12/2015 HbA1c = 13.9.    Patient Active Problem List   Diagnosis Date Noted  . NSVT (nonsustained ventricular tachycardia) ([place]) 11/22/2018  . Chronic systolic heart failure ([place]) 10/12/2018  . HTN (hypertension) 10/12/2018  . Preop cardiovascular exam 12/10/2016  .  Hypertensive heart disease   . Ischemic cardiomyopathy   . Hypercholesterolemia   . CKD (chronic kidney disease), stage III ([place])   . Hyperlipidemia 07/31/2014  . Orthostatic hypotension 06/15/2014  . Chronic kidney disease 05/21/2014  . Chronic osteomyelitis of toe of left foot ([place]) 05/21/2014  . Diabetic ulcer of left foot ([place]) 05/21/2014  . Physical deconditioning 05/15/2014  . S/P CABG x 4 05/13/2014  . Diabetes mellitus type 2 with complications ([place]) 19/41/7408  . CAD (coronary artery disease)     Past Surgical History:  Procedure Laterality Date  . ACHILLES TENDON SURGERY Right 01/22/2017   Procedure: ACHILLES LENGTHENING/KIDNER/TAL/[place] achilles lengthening;  Surgeon: Samara Deist, DPM;  Location: [facility] ORS;  Service: Podiatry;  Laterality: Right;  . CARDIAC CATHETERIZATION     03/2014  . CATARACT EXTRACTION    . CORONARY ARTERY BYPASS GRAFT N/A 05/07/2014   Procedure: CORONARY ARTERY BYPASS GRAFTING (CABG);  Surgeon: Gaye Pollack, MD;  Location: [place];  Service: Open Heart Surgery;  Laterality: N/A;  Times 4 using left internal mammary artery and endoscopically harvested right saphenous vein  . INTRAOPERATIVE TRANSESOPHAGEAL ECHOCARDIOGRAM N/A 05/07/2014   Procedure: INTRAOPERATIVE TRANSESOPHAGEAL ECHOCARDIOGRAM;  Surgeon: Gaye Pollack, MD;  [facility];  Service: Open Heart Surgery;  Laterality: N/A;  . left foot osteomyelitis and wound after surgery    . TOE AMPUTATION  Left 3 and 4 toes  . TONSILECTOMY/ADENOIDECTOMY WITH MYRINGOTOMY      Prior to Admission medications   Medication Sig Start Date End Date Taking? Authorizing Provider  aspirin EC 81 MG tablet Take 81 mg by mouth daily.    [provider]  atorvastatin (LIPITOR) 40 MG tablet Take 1 tablet (40 mg total) by mouth at bedtime. 11/23/18   Minna Merritts, MD  carvedilol (COREG) 25 MG tablet Take 1 tablet (25 mg total) by mouth 2 (two) times daily with a meal. 11/23/18   Gollan, Kathlene November, MD   Cyanocobalamin (VITAMIN B 12 PO) Take by mouth daily.    [provider]  ferrous sulfate 325 (65 FE) MG tablet Take 325 mg by mouth daily.    [provider]  furosemide (LASIX) 20 MG tablet Take 20 mg by mouth daily as needed.    [provider]  glimepiride (AMARYL) 4 MG tablet Take 4 mg by mouth 2 (two) times daily with a meal.  11/20/16   [provider]  hydrALAZINE (APRESOLINE) 25 MG tablet Take 1 tablet (25 mg total) by mouth 2 (two) times daily. 07/06/19   Rise Mu, PA-C  HYDROcodone-acetaminophen (NORCO/VICODIN) 5-325 MG tablet Take by mouth as needed. 05/30/19   [provider]  insulin aspart (NOVOLOG FLEXPEN) 100 UNIT/ML FlexPen Inject 0-16 Units into the skin 3 (three) times daily with meals. Sliding scale insulin    [provider]  isosorbide mononitrate (IMDUR) 60 MG 24 hr tablet Take 0.5 tablets (30 mg total) by mouth daily. 07/06/19   Rise Mu, PA-C  losartan (COZAAR) 25 MG tablet Take by mouth daily. 05/30/19 05/29/20  [provider]  nitroGLYCERIN (NITROSTAT) 0.4 MG SL tablet Place 1 tablet (0.4 mg total) under the tongue every 5 (five) minutes as needed for chest pain. 12/24/15   Theora Gianotti, NP  sodium bicarbonate 650 MG tablet 2 (two) times daily. 03/01/19   [provider]     Allergies Other  Family History  Problem Relation Age of Onset  . Heart disease Father        CABG in his 65's  . Diabetes type II Other   . Kidney disease Other   . Hypertension Other   . Ovarian cancer Mother     Social History Social History   Tobacco Use  . Smoking status: Never Smoker  . Smokeless tobacco: Never Used  Substance Use Topics  . Alcohol use: No  . Drug use: No    Review of Systems  Constitutional: No fever/chills Eyes: No visual changes.  ENT: No sore throat. Cardiovascular: Denies chest pain. Respiratory: Denies shortness of breath. Gastrointestinal: As above  Genitourinary: Negative for dysuria. Musculoskeletal: Negative for back pain. Skin: Negative for rash. Neurological: Negative for headaches or weakness   ____________________________________________   PHYSICAL EXAM:  VITAL SIGNS: ED Triage Vitals  Enc Vitals Group     BP 07/10/19 1434 137/72     Pulse Rate 07/10/19 1434 75     Resp 07/10/19 1434 18     Temp 07/10/19 1434 97.7 F (36.5 C)     Temp Source 07/10/19 1434 Oral     SpO2 07/10/19 1434 99 %     Weight 07/10/19 1435 117.5 kg (259 lb)     Height 07/10/19 1435 1.854 m (6\' 1" )     Head Circumference --      Peak Flow --      Pain Score 07/10/19  1435 0     Pain Loc --      Pain Edu? --      Excl. in [place]? --     Constitutional: Alert and oriented.   Nose: No congestion/rhinnorhea. Mouth/Throat: Mucous membranes are moist.    Cardiovascular: Normal rate, regular rhythm. Grossly normal heart sounds.  Good peripheral circulation. Respiratory: Normal respiratory effort.  No retractions. Lungs CTAB. Gastrointestinal: Soft and nontender. No distention.  No CVA tenderness.  Musculoskeletal:   Warm and well perfused Neurologic:  Normal speech and language. No gross focal neurologic deficits are appreciated.  Skin:  Skin is warm, dry and intact. No rash noted. Psychiatric: Mood and affect are normal. Speech and behavior are normal.  ____________________________________________   LABS (all labs ordered are listed, but only abnormal results are displayed)  Labs Reviewed  LIPASE, BLOOD - Abnormal; Notable for the following components:      Result Value   Lipase 122 (*)    All other components within normal limits  COMPREHENSIVE METABOLIC PANEL - Abnormal; Notable for the following components:   Chloride 119 (*)    CO2 14 (*)    BUN 77 (*)    Creatinine, Ser 4.52 (*)    Calcium 8.0 (*)    Albumin 3.4 (*)    GFR calc non Af Amer 14 (*)    GFR calc Af Amer 16 (*)    All other components within normal limits  CBC -  Abnormal; Notable for the following components:   RBC 3.64 (*)    Hemoglobin 11.2 (*)    HCT 34.6 (*)    All other components within normal limits  SARS CORONAVIRUS 2  URINALYSIS, COMPLETE (UACMP) WITH MICROSCOPIC   ____________________________________________  EKG  None ____________________________________________  RADIOLOGY  None ____________________________________________   PROCEDURES  Procedure(s) performed: No  Procedures   Critical Care performed: No ____________________________________________   INITIAL IMPRESSION / ASSESSMENT AND PLAN / ED COURSE  Pertinent labs & imaging results that were available during my care of the patient were reviewed by me and considered in my medical decision making (see chart for details).  Patient presents with diarrhea and dehydration as noted above, states he is feeling somewhat better today however lab work is concerning for creatinine of 4.5 with a BUN of 77, this is markedly elevated above his baseline of 2.8-3.  He will require IV fluids and admission for this.    ____________________________________________   FINAL CLINICAL IMPRESSION(S) / ED DIAGNOSES  Final diagnoses:  Dehydration  Acute renal failure superimposed on chronic kidney disease, unspecified CKD stage, unspecified acute renal failure type [facility])        Note:  This document was prepared using Dragon voice recognition software and may include unintentional dictation errors.   Lavonia Drafts, MD 07/10/19 [id number]

## 2019-07-11 ENCOUNTER — Observation Stay: Payer: Medicare HMO

## 2019-07-11 DIAGNOSIS — R197 Diarrhea, unspecified: Secondary | ICD-10-CM | POA: Diagnosis not present

## 2019-07-11 LAB — GASTROINTESTINAL PANEL BY PCR, STOOL (REPLACES STOOL CULTURE)

## 2019-07-11 LAB — BASIC METABOLIC PANEL
Anion gap: 6 (ref 5–15)
BUN: 70 mg/dL — ABNORMAL HIGH (ref 6–20)
CO2: 15 mmol/L — ABNORMAL LOW (ref 22–32)
Calcium: 7.6 mg/dL — ABNORMAL LOW (ref 8.9–10.3)
Chloride: 123 mmol/L — ABNORMAL HIGH (ref 98–111)
Creatinine, Ser: 3.77 mg/dL — ABNORMAL HIGH (ref 0.61–1.24)
GFR calc Af Amer: 20 mL/min — ABNORMAL LOW (ref >60)
GFR calc non Af Amer: 17 mL/min — ABNORMAL LOW (ref >60)
Glucose, Bld: 80 mg/dL (ref 70–99)
Potassium: 4.4 mmol/L (ref 3.5–5.1)
Sodium: 144 mmol/L (ref 135–145)

## 2019-07-11 LAB — GLUCOSE, CAPILLARY
Glucose-Capillary: 184 mg/dL — ABNORMAL HIGH (ref 70–99)
Glucose-Capillary: 115 mg/dL — ABNORMAL HIGH (ref 70–99)
Glucose-Capillary: 91 mg/dL (ref 70–99)

## 2019-07-11 LAB — C DIFFICILE QUICK SCREEN W PCR REFLEX
C Diff antigen: NEGATIVE
C Diff interpretation: NOT DETECTED
C Diff toxin: NEGATIVE

## 2019-07-11 LAB — SARS CORONAVIRUS 2 (TAT 6-24 HRS): SARS Coronavirus 2: NEGATIVE

## 2019-07-11 LAB — LIPASE, BLOOD: Lipase: 73 U/L — ABNORMAL HIGH (ref 11–51)

## 2019-07-11 MED ORDER — INSULIN ASPART 100 UNIT/ML ~~LOC~~ SOLN
0.0000 [IU] | Freq: Three times a day (TID) | SUBCUTANEOUS | Status: DC
  Administered 2019-07-11: 2 [IU] via SUBCUTANEOUS
  Administered 2019-07-12: 1 [IU] via SUBCUTANEOUS
  Filled 2019-07-11 (×2): qty 1

## 2019-07-11 MED ORDER — LOPERAMIDE HCL 2 MG PO CAPS
2.0000 mg | ORAL_CAPSULE | Freq: Four times a day (QID) | ORAL | Status: DC | PRN
  Administered 2019-07-11 – 2019-07-12 (×2): 2 mg via ORAL
  Filled 2019-07-11 (×2): qty 1

## 2019-07-11 MED ORDER — IOHEXOL 240 MG/ML SOLN
50.0000 mL | Freq: Once | INTRAMUSCULAR | Status: AC | PRN
  Administered 2019-07-11 (×2): 25 mL via ORAL

## 2019-07-11 MED ORDER — INSULIN ASPART 100 UNIT/ML ~~LOC~~ SOLN: 0.0000 [IU] | Freq: Every day | SUBCUTANEOUS | Status: DC

## 2019-07-11 NOTE — Care Management Obs Status (Signed)
[place] NOTIFICATION   Patient Details  Name: [name] [name] MRN: [id number] Date of Birth: 1961-07-19   Medicare Observation Status Notification Given:  Yes    Tommy Medal 07/11/2019, 1:49 PM

## 2019-07-11 NOTE — Progress Notes (Signed)
[place] at [place] NAME: [name] [name]    MR#:  [id number]  DATE OF BIRTH:  [birth year]  SUBJECTIVE:   Pt. Admitted to the hospital due to acute diarrhea and noted to be in acute on chronic renal failure.  Patient still having some diarrhea.  He denies any abdominal pain nausea or vomiting.  COVID-19 test is negative.  REVIEW OF SYSTEMS:    Review of Systems  Constitutional: Negative for chills and fever.  HENT: Negative for congestion and tinnitus.   Eyes: Negative for blurred vision and double vision.  Respiratory: Negative for cough, shortness of breath and wheezing.   Cardiovascular: Negative for chest pain, orthopnea and PND.  Gastrointestinal: Positive for diarrhea. Negative for abdominal pain, nausea and vomiting.  Genitourinary: Negative for dysuria and hematuria.  Neurological: Positive for weakness (generalized). Negative for dizziness, sensory change and focal weakness.  All other systems reviewed and are negative.   Nutrition: Carb modified Tolerating Diet: Yes Tolerating PT: Ambulatory   DRUG ALLERGIES:   Allergies  Allergen Reactions  . Other Other (See Comments)    Pt. And wife state that he had a reaction to "some" antibiotic but they do not know what the name of it was.They are are very apprehensive about him receiving any antibiotic. Zosyn, Clindamycin, & Vancomycin (all started together unsure which caused reaction 07/18/2013)    VITALS:  Blood pressure (!) 143/72, pulse 74, temperature 98.3 F (36.8 C), temperature source Oral, resp. rate 18, height 6\' 1"  (1.854 m), weight 117.5 kg, SpO2 99 %.  PHYSICAL EXAMINATION:   Physical Exam  GENERAL:  58 y.o.-year-old obese patient lying in bed in no acute distress.  EYES: Pupils equal, round, reactive to light and accommodation. No scleral icterus. Extraocular muscles intact.  HEENT: Head atraumatic, normocephalic. Oropharynx and nasopharynx clear.  NECK:   Supple, no jugular venous distention. No thyroid enlargement, no tenderness.  LUNGS: Normal breath sounds bilaterally, no wheezing, rales, rhonchi. No use of accessory muscles of respiration.  CARDIOVASCULAR: S1, S2 normal. No murmurs, rubs, or gallops.  ABDOMEN: Soft, nontender, nondistended. Bowel sounds present. No organomegaly or mass.  EXTREMITIES: No cyanosis, clubbing or edema b/l.    NEUROLOGIC: Cranial nerves II through XII are intact. No focal Motor or sensory deficits b/l. Globally weak.    PSYCHIATRIC: The patient is alert and oriented x 3.  SKIN: No obvious rash, lesion, or ulcer.    LABORATORY PANEL:   CBC Recent Labs  Lab 07/10/19 1438  WBC 8.0  HGB 11.2*  HCT 34.6*  PLT 171   ------------------------------------------------------------------------------------------------------------------  Chemistries  Recent Labs  Lab 07/10/19 1438 07/11/19 0507  NA 139 144  K 4.8 4.4  CL 119* 123*  CO2 14* 15*  GLUCOSE 91 80  BUN 77* 70*  CREATININE 4.52* 3.77*  CALCIUM 8.0* 7.6*  AST 25  --   ALT 26  --   ALKPHOS 52  --   BILITOT 0.7  --    ------------------------------------------------------------------------------------------------------------------  Cardiac Enzymes No results for input(s): TROPONINI in the last 168 hours. ------------------------------------------------------------------------------------------------------------------  RADIOLOGY:  No results found.   ASSESSMENT AND PLAN:   58 year old male with past medical history of diabetes, peripheral vascular disease, diabetic neuropathy, hypertension, ischemic cardiomyopathy, hyperlipidemia, CKD stage III, chronic systolic CHF who presented to the hospital due to acute diarrhea noted to be acute on chronic renal failure.  1.  Acute on chronic renal failure-patient's baseline creatinine around 2.4.  Patient  presented to the hospital the creatinine of 4.5 and this was secondary to volume loss and  acute diarrhea. - Continue IV fluid hydration and creatinine down to 3.7 today. - We will dose meds, avoid nephrotoxins.  Nephrology following and agree with the plan of care.  2.  Acute diarrhea-etiology unclear.  Patient apparently has had this off for over a week.  Stool for C. difficile and comprehensive culture is negative.  Patient was on Imodium at home and not improving. - Discussed with patient's wife and will get CT scan of the abdomen pelvis to rule out acute pathology.  If still not improving consider getting a gastroenterology consult.  3.  Essential hypertension-continue carvedilol, hydralazine, Imdur  4.  Diabetes type 2 with CKD stage III- continue sliding scale insulin, follow blood sugars.  5.  Phonic systolic CHF-clinically patient is not in congestive heart failure. -Hold patient's diuretics given the acute on chronic renal failure and volume loss.  Continue carvedilol.  Patient not on ACE inhibitor given his CKD.  6.  Hyperlipidemia-continue atorvastatin.  Discussed plan of care with patient's wife over the phone.   All the records are reviewed and case discussed with Care Management/Social Worker. Management plans discussed with the patient, family and they are in agreement.  CODE STATUS: Full code  DVT Prophylaxis: Heparin subcu  TOTAL TIME TAKING CARE OF THIS PATIENT: 35 minutes.   POSSIBLE D/C IN 1-2 DAYS, DEPENDING ON CLINICAL CONDITION.   Henreitta Leber M.D on 07/11/2019 at 3:28 PM  Between 7am to 6pm - Pager - [phone]  After 6pm go to [email or web address] - Proofreader  [facility]  Office  [phone]  CC: Primary care physician; Tracie Harrier, MD

## 2019-07-11 NOTE — Progress Notes (Signed)
[facility], [place] 07/11/19  Subjective:   Patient known to our practice from outpatient follow-up He is followed for CKD stage IV.  His baseline creatinine from April is 2.4/GFR 29 He is admitted for profuse diarrhea causing dehydration He says is been going on for about 5 days He thought yesterday was getting better but had another episode this morning Admission creatinine was 4.5 which improved to 3.77 today with hydration.  Patient is getting IV fluids at 75 cc/h  Objective:  Vital signs in last 24 hours:  Temp:  [97.4 F (36.3 C)-98.3 F (36.8 C)] 98.3 F (36.8 C) (08/25 1213) Pulse Rate:  [74-79] 74 (08/25 1213) Resp:  [16-20] 18 (08/25 1213) BP: (137-143)/(69-76) 143/72 (08/25 1213) SpO2:  [98 %-100 %] 99 % (08/25 1213) Weight:  [117.5 kg] 117.5 kg (08/24 1435)  Weight change:  Filed Weights   07/10/19 1435  Weight: 117.5 kg    Intake/Output:    Intake/Output Summary (Last 24 hours) at 07/11/2019 1307 Last data filed at 07/11/2019 1219 Gross per 24 hour  Intake 1491.61 ml  Output 1500 ml  Net -8.39 ml     Physical Exam: General: No acute distress  HEENT Anicteric, moist oral mucous membrane  Neck Supple, no mass  Pulm/lungs Clear to auscultation bilaterally  CVS/Heart Regular rate and rhythm  Abdomen:  Soft, nontender, obese  Extremities: Trace edema  Neurologic: Alert, oriented  Skin: No acute rashes    Basic Metabolic Panel:  Recent Labs  Lab 07/10/19 1438 07/11/19 0507  NA 139 144  K 4.8 4.4  CL 119* 123*  CO2 14* 15*  GLUCOSE 91 80  BUN 77* 70*  CREATININE 4.52* 3.77*  CALCIUM 8.0* 7.6*     CBC: Recent Labs  Lab 07/10/19 1438  WBC 8.0  HGB 11.2*  HCT 34.6*  MCV 95.1  PLT 171     No results found for: HEPBSAG, HEPBSAB, HEPBIGM    Microbiology:  Recent Results (from the past 240 hour(s))  SARS CORONAVIRUS 2 (TAT 6-12 HRS) Nasal Swab Aptima Multi Swab     Status: None   Collection Time:  07/10/19  3:59 PM   Specimen: Aptima Multi Swab; Nasal Swab  Result Value Ref Range Status   SARS Coronavirus 2 NEGATIVE NEGATIVE Final    Comment: (NOTE) SARS-CoV-2 target nucleic acids are NOT DETECTED. The SARS-CoV-2 RNA is generally detectable in upper and lower respiratory specimens during the acute phase of infection. Negative results do not preclude SARS-CoV-2 infection, do not rule out co-infections with other pathogens, and should not be used as the sole basis for treatment or other patient management decisions. Negative results must be combined with clinical observations, patient history, and epidemiological information. The expected result is Negative. Fact Sheet for Patients: SugarRoll.be Fact Sheet for Healthcare Providers: https://www.woods-mathews.com/ This test is not yet approved or cleared by the [place] FDA and  has been authorized for detection and/or diagnosis of SARS-CoV-2 by FDA under an Emergency Use Authorization (EUA). This EUA will remain  in effect (meaning this test can be used) for the duration of the COVID-19 declaration under Section 56 4(b)(1) of the Act, 21 U.S.C. section 360bbb-3(b)(1), unless the authorization is terminated or revoked sooner. Performed at [facility], [place] [address], [place], [place] [address]   Gastrointestinal Panel by PCR , Stool     Status: None   Collection Time: 07/11/19  2:07 AM   Specimen: Stool  Result Value Ref Range Status  Campylobacter species NOT DETECTED NOT DETECTED Final   Plesimonas shigelloides NOT DETECTED NOT DETECTED Final   Salmonella species NOT DETECTED NOT DETECTED Final   Yersinia enterocolitica NOT DETECTED NOT DETECTED Final   Vibrio species NOT DETECTED NOT DETECTED Final   Vibrio cholerae NOT DETECTED NOT DETECTED Final   Enteroaggregative E coli (EAEC) NOT DETECTED NOT DETECTED Final   Enteropathogenic E coli (EPEC) NOT DETECTED NOT  DETECTED Final   Enterotoxigenic E coli (ETEC) NOT DETECTED NOT DETECTED Final   Shiga like toxin producing E coli (STEC) NOT DETECTED NOT DETECTED Final   Shigella/Enteroinvasive E coli (EIEC) NOT DETECTED NOT DETECTED Final   Cryptosporidium NOT DETECTED NOT DETECTED Final   Cyclospora cayetanensis NOT DETECTED NOT DETECTED Final   Entamoeba histolytica NOT DETECTED NOT DETECTED Final   Giardia lamblia NOT DETECTED NOT DETECTED Final   Adenovirus F40/41 NOT DETECTED NOT DETECTED Final   Astrovirus NOT DETECTED NOT DETECTED Final   Norovirus GI/GII NOT DETECTED NOT DETECTED Final   Rotavirus A NOT DETECTED NOT DETECTED Final   Sapovirus (I, II, IV, and V) NOT DETECTED NOT DETECTED Final    Comment: Performed at [facility], [address] [place], [place] [address]  C Difficile Quick Screen w PCR reflex     Status: None   Collection Time: 07/11/19  2:07 AM   Specimen: STOOL  Result Value Ref Range Status   C Diff antigen NEGATIVE NEGATIVE Final   C Diff toxin NEGATIVE NEGATIVE Final   C Diff interpretation No C. difficile detected.  Final    Comment: Performed at [facility], [address] [place], [place] [address]    Coagulation Studies: No results for input(s): LABPROT, INR in the last 72 hours.  Urinalysis: No results for input(s): COLORURINE, LABSPEC, PHURINE, GLUCOSEU, HGBUR, BILIRUBINUR, KETONESUR, PROTEINUR, UROBILINOGEN, NITRITE, LEUKOCYTESUR in the last 72 hours.  Invalid input(s): APPERANCEUR    Imaging: No results found.   Medications:   . sodium chloride 75 mL/hr at 07/11/19 1200   . aspirin EC  81 mg Oral Daily  . atorvastatin  40 mg Oral QHS  . carvedilol  25 mg Oral BID WC  . ferrous sulfate  325 mg Oral Daily  . heparin  5,000 Units Subcutaneous Q8H  . hydrALAZINE  25 mg Oral BID  . insulin aspart  0-5 Units Subcutaneous QHS  . insulin aspart  0-9 Units Subcutaneous TID WC  . isosorbide mononitrate  30 mg Oral Daily  .  sodium bicarbonate  1,300 mg Oral BID  . sodium chloride flush  3 mL Intravenous Once   HYDROcodone-acetaminophen, loperamide, nitroGLYCERIN  Assessment/ Plan:  58 y.o. male With poorly controlled diabetes, hypertension, history of osteomyelitis, chronic systolic CHF, chronic kidney disease is admitted for dehydration due to diarrhea  1.  Acute kidney injury. CKD st 4,  Baseline creatinine of 2.4/GFR 29 from April 2020.  Creatinine at the time of admission was 4.5 and improved to 3.7 today with IV hydration Plan to continue IV hydration Work-up for diarrhea is in progress We will continue to follow   LOS: 0 [name] [name] 8/25/20201:07 PM  [place], [place]  Note: This note was prepared with Dragon dictation. Any transcription errors are unintentional

## 2019-07-12 DIAGNOSIS — Z20828 Contact with and (suspected) exposure to other viral communicable diseases: Secondary | ICD-10-CM | POA: Diagnosis present

## 2019-07-12 DIAGNOSIS — R197 Diarrhea, unspecified: Secondary | ICD-10-CM

## 2019-07-12 DIAGNOSIS — Z8249 Family history of ischemic heart disease and other diseases of the circulatory system: Secondary | ICD-10-CM | POA: Diagnosis not present

## 2019-07-12 DIAGNOSIS — I255 Ischemic cardiomyopathy: Secondary | ICD-10-CM | POA: Diagnosis present

## 2019-07-12 DIAGNOSIS — Z7982 Long term (current) use of aspirin: Secondary | ICD-10-CM | POA: Diagnosis not present

## 2019-07-12 DIAGNOSIS — E1151 Type 2 diabetes mellitus with diabetic peripheral angiopathy without gangrene: Secondary | ICD-10-CM | POA: Diagnosis present

## 2019-07-12 DIAGNOSIS — Z79899 Other long term (current) drug therapy: Secondary | ICD-10-CM | POA: Diagnosis not present

## 2019-07-12 DIAGNOSIS — Z833 Family history of diabetes mellitus: Secondary | ICD-10-CM | POA: Diagnosis not present

## 2019-07-12 DIAGNOSIS — N179 Acute kidney failure, unspecified: Secondary | ICD-10-CM | POA: Diagnosis present

## 2019-07-12 DIAGNOSIS — E785 Hyperlipidemia, unspecified: Secondary | ICD-10-CM | POA: Diagnosis present

## 2019-07-12 DIAGNOSIS — Z841 Family history of disorders of kidney and ureter: Secondary | ICD-10-CM | POA: Diagnosis not present

## 2019-07-12 DIAGNOSIS — Z89422 Acquired absence of other left toe(s): Secondary | ICD-10-CM | POA: Diagnosis not present

## 2019-07-12 DIAGNOSIS — E1122 Type 2 diabetes mellitus with diabetic chronic kidney disease: Secondary | ICD-10-CM | POA: Diagnosis present

## 2019-07-12 DIAGNOSIS — Z6834 Body mass index (BMI) 34.0-34.9, adult: Secondary | ICD-10-CM | POA: Diagnosis not present

## 2019-07-12 DIAGNOSIS — I130 Hypertensive heart and chronic kidney disease with heart failure and stage 1 through stage 4 chronic kidney disease, or unspecified chronic kidney disease: Secondary | ICD-10-CM | POA: Diagnosis present

## 2019-07-12 DIAGNOSIS — I2510 Atherosclerotic heart disease of native coronary artery without angina pectoris: Secondary | ICD-10-CM | POA: Diagnosis present

## 2019-07-12 DIAGNOSIS — Z951 Presence of aortocoronary bypass graft: Secondary | ICD-10-CM | POA: Diagnosis not present

## 2019-07-12 DIAGNOSIS — I252 Old myocardial infarction: Secondary | ICD-10-CM | POA: Diagnosis not present

## 2019-07-12 DIAGNOSIS — I5022 Chronic systolic (congestive) heart failure: Secondary | ICD-10-CM | POA: Diagnosis present

## 2019-07-12 DIAGNOSIS — Z794 Long term (current) use of insulin: Secondary | ICD-10-CM | POA: Diagnosis not present

## 2019-07-12 DIAGNOSIS — E669 Obesity, unspecified: Secondary | ICD-10-CM | POA: Diagnosis present

## 2019-07-12 DIAGNOSIS — N184 Chronic kidney disease, stage 4 (severe): Secondary | ICD-10-CM | POA: Diagnosis present

## 2019-07-12 DIAGNOSIS — E860 Dehydration: Secondary | ICD-10-CM | POA: Diagnosis present

## 2019-07-12 DIAGNOSIS — E1142 Type 2 diabetes mellitus with diabetic polyneuropathy: Secondary | ICD-10-CM | POA: Diagnosis present

## 2019-07-12 DIAGNOSIS — A09 Infectious gastroenteritis and colitis, unspecified: Secondary | ICD-10-CM | POA: Diagnosis present

## 2019-07-12 LAB — BASIC METABOLIC PANEL
Anion gap: 3 — ABNORMAL LOW (ref 5–15)
BUN: 58 mg/dL — ABNORMAL HIGH (ref 6–20)
CO2: 15 mmol/L — ABNORMAL LOW (ref 22–32)
Calcium: 7.3 mg/dL — ABNORMAL LOW (ref 8.9–10.3)
Chloride: 124 mmol/L — ABNORMAL HIGH (ref 98–111)
Creatinine, Ser: 3.1 mg/dL — ABNORMAL HIGH (ref 0.61–1.24)
GFR calc Af Amer: 25 mL/min — ABNORMAL LOW (ref >60)
GFR calc non Af Amer: 21 mL/min — ABNORMAL LOW (ref >60)
Glucose, Bld: 113 mg/dL — ABNORMAL HIGH (ref 70–99)
Potassium: 4.8 mmol/L (ref 3.5–5.1)
Sodium: 142 mmol/L (ref 135–145)

## 2019-07-12 LAB — GLUCOSE, CAPILLARY
Glucose-Capillary: 70 mg/dL (ref 70–99)
Glucose-Capillary: 140 mg/dL — ABNORMAL HIGH (ref 70–99)
Glucose-Capillary: 118 mg/dL — ABNORMAL HIGH (ref 70–99)
Glucose-Capillary: 102 mg/dL — ABNORMAL HIGH (ref 70–99)

## 2019-07-12 LAB — CBC
HCT: 30.6 % — ABNORMAL LOW (ref 39.0–52.0)
Hemoglobin: 9.8 g/dL — ABNORMAL LOW (ref 13.0–17.0)
MCH: 30.7 pg (ref 26.0–34.0)
MCHC: 32 g/dL (ref 30.0–36.0)
MCV: 95.9 fL (ref 80.0–100.0)
Platelets: 140 10*3/uL — ABNORMAL LOW (ref 150–400)
RBC: 3.19 MIL/uL — ABNORMAL LOW (ref 4.22–5.81)
RDW: 13.2 % (ref 11.5–15.5)
WBC: 6.3 10*3/uL (ref 4.0–10.5)
nRBC: 0 % (ref 0.0–0.2)

## 2019-07-12 MED ORDER — LOPERAMIDE HCL 2 MG PO CAPS
2.0000 mg | ORAL_CAPSULE | ORAL | Status: DC | PRN
  Administered 2019-07-12 (×3): 2 mg via ORAL
  Filled 2019-07-12 (×3): qty 1

## 2019-07-12 NOTE — Progress Notes (Signed)
[facility], [place] 07/12/19  Subjective:   Patient known to our practice from outpatient follow-up He is followed for CKD stage IV.  His baseline creatinine from April is 2.4/GFR 29 He is admitted for profuse diarrhea causing dehydration He says is been going on for about 5 days prior to admission States he had diarrhea for 4 hours yesterday Admission creatinine was 4.5 which improved to 3.77 t-> 3.10 today with hydration.  Patient is getting IV fluids at 75 cc/h  Objective:  Vital signs in last 24 hours:  Temp:  [98 F (36.7 C)-98.3 F (36.8 C)] 98 F (36.7 C) (08/26 0503) Pulse Rate:  [74-80] 75 (08/26 0503) Resp:  [18] 18 (08/25 2036) BP: (143-160)/(72-88) 154/80 (08/26 0503) SpO2:  [99 %-100 %] 100 % (08/26 0503)  Weight change:  Filed Weights   07/10/19 1435  Weight: 117.5 kg    Intake/Output:    Intake/Output Summary (Last 24 hours) at 07/12/2019 1125 Last data filed at 07/12/2019 0600 Gross per 24 hour  Intake 1979.77 ml  Output 300 ml  Net 1679.77 ml     Physical Exam: General: No acute distress  HEENT Anicteric, moist oral mucous membrane  Neck Supple, no mass  Pulm/lungs Clear to auscultation bilaterally  CVS/Heart Regular rate and rhythm  Abdomen:  Soft, nontender, obese  Extremities: Trace edema  Neurologic: Alert, oriented  Skin: No acute rashes    Basic Metabolic Panel:  Recent Labs  Lab 07/10/19 1438 07/11/19 0507 07/12/19 0416  NA 139 144 142  K 4.8 4.4 4.8  CL 119* 123* 124*  CO2 14* 15* 15*  GLUCOSE 91 80 113*  BUN 77* 70* 58*  CREATININE 4.52* 3.77* 3.10*  CALCIUM 8.0* 7.6* 7.3*     CBC: Recent Labs  Lab 07/10/19 1438 07/12/19 0416  WBC 8.0 6.3  HGB 11.2* 9.8*  HCT 34.6* 30.6*  MCV 95.1 95.9  PLT 171 140*     No results found for: HEPBSAG, HEPBSAB, HEPBIGM    Microbiology:  Recent Results (from the past 240 hour(s))  SARS CORONAVIRUS 2 (TAT 6-12 HRS) Nasal Swab Aptima Multi Swab      Status: None   Collection Time: 07/10/19  3:59 PM   Specimen: Aptima Multi Swab; Nasal Swab  Result Value Ref Range Status   SARS Coronavirus 2 NEGATIVE NEGATIVE Final    Comment: (NOTE) SARS-CoV-2 target nucleic acids are NOT DETECTED. The SARS-CoV-2 RNA is generally detectable in upper and lower respiratory specimens during the acute phase of infection. Negative results do not preclude SARS-CoV-2 infection, do not rule out co-infections with other pathogens, and should not be used as the sole basis for treatment or other patient management decisions. Negative results must be combined with clinical observations, patient history, and epidemiological information. The expected result is Negative. Fact Sheet for Patients: SugarRoll.be Fact Sheet for Healthcare Providers: https://www.woods-mathews.com/ This test is not yet approved or cleared by the [place] FDA and  has been authorized for detection and/or diagnosis of SARS-CoV-2 by FDA under an Emergency Use Authorization (EUA). This EUA will remain  in effect (meaning this test can be used) for the duration of the COVID-19 declaration under Section 56 4(b)(1) of the Act, 21 U.S.C. section 360bbb-3(b)(1), unless the authorization is terminated or revoked sooner. Performed at [facility], [place] [address], [place], [place] [address]   Gastrointestinal Panel by PCR , Stool     Status: None   Collection Time: 07/11/19  2:07 AM  Specimen: Stool  Result Value Ref Range Status   Campylobacter species NOT DETECTED NOT DETECTED Final   Plesimonas shigelloides NOT DETECTED NOT DETECTED Final   Salmonella species NOT DETECTED NOT DETECTED Final   Yersinia enterocolitica NOT DETECTED NOT DETECTED Final   Vibrio species NOT DETECTED NOT DETECTED Final   Vibrio cholerae NOT DETECTED NOT DETECTED Final   Enteroaggregative E coli (EAEC) NOT DETECTED NOT DETECTED Final   Enteropathogenic E  coli (EPEC) NOT DETECTED NOT DETECTED Final   Enterotoxigenic E coli (ETEC) NOT DETECTED NOT DETECTED Final   Shiga like toxin producing E coli (STEC) NOT DETECTED NOT DETECTED Final   Shigella/Enteroinvasive E coli (EIEC) NOT DETECTED NOT DETECTED Final   Cryptosporidium NOT DETECTED NOT DETECTED Final   Cyclospora cayetanensis NOT DETECTED NOT DETECTED Final   Entamoeba histolytica NOT DETECTED NOT DETECTED Final   Giardia lamblia NOT DETECTED NOT DETECTED Final   Adenovirus F40/41 NOT DETECTED NOT DETECTED Final   Astrovirus NOT DETECTED NOT DETECTED Final   Norovirus GI/GII NOT DETECTED NOT DETECTED Final   Rotavirus A NOT DETECTED NOT DETECTED Final   Sapovirus (I, II, IV, and V) NOT DETECTED NOT DETECTED Final    Comment: Performed at [facility], [address] [place], [place] [address]  C Difficile Quick Screen w PCR reflex     Status: None   Collection Time: 07/11/19  2:07 AM   Specimen: STOOL  Result Value Ref Range Status   C Diff antigen NEGATIVE NEGATIVE Final   C Diff toxin NEGATIVE NEGATIVE Final   C Diff interpretation No C. difficile detected.  Final    Comment: Performed at [facility], [address] [place], [place] [address]    Coagulation Studies: No results for input(s): LABPROT, INR in the last 72 hours.  Urinalysis: No results for input(s): COLORURINE, LABSPEC, PHURINE, GLUCOSEU, HGBUR, BILIRUBINUR, KETONESUR, PROTEINUR, UROBILINOGEN, NITRITE, LEUKOCYTESUR in the last 72 hours.  Invalid input(s): APPERANCEUR    Imaging: Ct Abdomen Pelvis Wo Contrast  Result Date: 07/11/2019 CLINICAL DATA:  Diarrhea EXAM: CT ABDOMEN AND PELVIS WITHOUT CONTRAST TECHNIQUE: Multidetector CT imaging of the abdomen and pelvis was performed following the standard protocol without IV contrast. COMPARISON:  02/16/2018 FINDINGS: Lower chest: Calcifications throughout the visualized right coronary artery. Heart is normal size. Aorta is normal caliber.  Hepatobiliary: No mediastinal, hilar, or axillary adenopathy. Pancreas: No focal abnormality or ductal dilatation. Spleen: No focal abnormality.  Normal size. Adrenals/Urinary Tract: Bilateral perinephric stranding. No renal or adrenal mass. No hydronephrosis. Urinary bladder unremarkable. Stomach/Bowel: Normal appendix. Stomach, large and small bowel grossly unremarkable. Vascular/Lymphatic: Aortic atherosclerosis. No enlarged abdominal or pelvic lymph nodes. Reproductive: Mildly prominent prostate with central calcifications. Other: No free fluid or free air. Musculoskeletal: No acute bony abnormality. IMPRESSION: No acute findings in the abdomen or pelvis. Aortic atherosclerosis, coronary artery disease. Electronically Signed   By: Rolm Baptise M.D.   On: 07/11/2019 18:19     Medications:   . sodium chloride 75 mL/hr at 07/12/19 0929   . aspirin EC  81 mg Oral Daily  . atorvastatin  40 mg Oral QHS  . carvedilol  25 mg Oral BID WC  . ferrous sulfate  325 mg Oral Daily  . heparin  5,000 Units Subcutaneous Q8H  . hydrALAZINE  25 mg Oral BID  . insulin aspart  0-5 Units Subcutaneous QHS  . insulin aspart  0-9 Units Subcutaneous TID WC  . isosorbide mononitrate  30 mg Oral Daily  .  sodium bicarbonate  1,300 mg Oral BID  . sodium chloride flush  3 mL Intravenous Once   HYDROcodone-acetaminophen, loperamide, nitroGLYCERIN  Assessment/ Plan:  58 y.o. male With poorly controlled diabetes, hypertension, history of osteomyelitis, chronic systolic CHF, chronic kidney disease is admitted for dehydration due to diarrhea  1.  Acute kidney injury. CKD st 4,  Baseline creatinine of 2.4/GFR 29 from April 2020.  Creatinine at the time of admission was 4.5 and improved to 3.1 today with IV hydration suggesting volume depletion Plan to continue IV hydration Work-up for diarrhea is in progress. C Diff and stool GI  panel are negative We will  follow   LOS: 0 Dillan Candela 8/26/202011:25 AM  [place], [place]  Note: This note was prepared with Dragon dictation. Any transcription errors are unintentional

## 2019-07-12 NOTE — Consult Note (Signed)
Vonda Antigua, MD [address], [place], [place], [place], [address] [address], [place], [place], [place], [address] Phone: [phone]  Fax: [phone]  Consultation  Referring Provider:    Dr. Verdell Carmine Primary Care Physician:  Tracie Harrier, MD Reason for Consultation:     Diarrhea  Date of Admission:  07/10/2019 Date of Consultation:  07/12/2019         HPI:   [name] [name] is a 58 y.o. male with 1 week history of multiple loose bowel movements a day.  Reports 10-11 watery bowel movements started a week ago.  States eats takeout food, but no one else who ate it is sick.  No recent travel.  No sick contacts.  No nausea or vomiting.  Reports abdominal cramping but no pain.  No blood in stool.  No prior history of similar symptoms.  No prior EGD or colonoscopy.  No family history of colon cancer.  Denies any new medications.  GI panel and C. difficile testing negative.  CT abdomen pelvis negative for acute findings as well.  Received Imodium today and has noted a significant improvement, with no bowel movement today.  Past Medical History:  Diagnosis Date   CAD (coronary artery disease)    a. 04/2014 CABG x 4 (LIMA->LAD, VG->Diag, VG->OM, VG->PDA).   Chronic systolic CHF (congestive heart failure) ([place])    a. 07/2014 Echo: EF 30-35%.   CKD (chronic kidney disease), stage III ([place])    a. 12/2015 Creat 1.8.   Diabetic foot ulcers ([place])    a. left foot 2nd digit ant 5 th digit 05/03/14; b. 08/2014 s/p amputation of toes on L foot.   Hypercholesterolemia    a. 12/2015 TC 116, TG 154, HDL 24, LDL 61-->Atorvastatin 40.   Hypertension    Hypertensive heart disease    Ischemic cardiomyopathy    a. 07/2013 Echo: EF 20%; b. 07/2014 Echo: EF 30-35%, diff HK, Gr1 DD, mildly dil LA, nl PASP.   Myocardial infarction ([place])    Neuropathy in diabetes [facility])    Open wound    foot   Orthostatic hypotension    Peripheral vascular disease ([place])    Type II diabetes  mellitus ([place])    a. 12/2015 HbA1c = 13.9.    Past Surgical History:  Procedure Laterality Date   ACHILLES TENDON SURGERY Right 01/22/2017   Procedure: ACHILLES LENGTHENING/KIDNER/TAL/Teno achilles lengthening;  Surgeon: Samara Deist, DPM;  Location: [facility] ORS;  Service: Podiatry;  Laterality: Right;   CARDIAC CATHETERIZATION     03/2014   CATARACT EXTRACTION     CORONARY ARTERY BYPASS GRAFT N/A 05/07/2014   Procedure: CORONARY ARTERY BYPASS GRAFTING (CABG);  Surgeon: Gaye Pollack, MD;  Location: [facility];  Service: Open Heart Surgery;  Laterality: N/A;  Times 4 using left internal mammary artery and endoscopically harvested right saphenous vein   INTRAOPERATIVE TRANSESOPHAGEAL ECHOCARDIOGRAM N/A 05/07/2014   Procedure: INTRAOPERATIVE TRANSESOPHAGEAL ECHOCARDIOGRAM;  Surgeon: Gaye Pollack, MD;  Location: [facility] OR;  Service: Open Heart Surgery;  Laterality: N/A;   left foot osteomyelitis and wound after surgery     TOE AMPUTATION     Left 3 and 4 toes   TONSILECTOMY/ADENOIDECTOMY WITH MYRINGOTOMY      Prior to Admission medications   Medication Sig Start Date End Date Taking? Authorizing Provider  aspirin EC 81 MG tablet Take 81 mg by mouth daily.   Yes [provider]  atorvastatin (LIPITOR) 40 MG tablet Take 1 tablet (40 mg total)  by mouth at bedtime. 11/23/18  Yes Minna Merritts, MD  carvedilol (COREG) 25 MG tablet Take 1 tablet (25 mg total) by mouth 2 (two) times daily with a meal. 11/23/18  Yes Gollan, Kathlene November, MD  Cyanocobalamin (VITAMIN B 12 PO) Take by mouth daily.   Yes [provider]  ferrous sulfate 325 (65 FE) MG tablet Take 325 mg by mouth daily.   Yes [provider]  glimepiride (AMARYL) 4 MG tablet Take 4 mg by mouth 2 (two) times daily with a meal.  11/20/16  Yes [provider]  hydrALAZINE (APRESOLINE) 25 MG tablet Take 1 tablet (25 mg total) by mouth 2 (two) times daily. 07/06/19  Yes Dunn, Areta Haber, PA-C  HYDROcodone-acetaminophen  (NORCO/VICODIN) 5-325 MG tablet Take by mouth as needed. 05/30/19  Yes [provider]  isosorbide mononitrate (IMDUR) 60 MG 24 hr tablet Take 0.5 tablets (30 mg total) by mouth daily. 07/06/19  Yes Dunn, Areta Haber, PA-C  losartan (COZAAR) 25 MG tablet Take 25 mg by mouth daily.  05/30/19 05/29/20 Yes [provider]  nitroGLYCERIN (NITROSTAT) 0.4 MG SL tablet Place 1 tablet (0.4 mg total) under the tongue every 5 (five) minutes as needed for chest pain. 12/24/15  Yes Theora Gianotti, NP  sodium bicarbonate 650 MG tablet Take 1,300 mg by mouth 2 (two) times daily.  03/01/19  Yes [provider]    Family History  Problem Relation Age of Onset   Heart disease Father        CABG in his 34's   Diabetes type II Other    Kidney disease Other    Hypertension Other    Ovarian cancer Mother      Social History   Tobacco Use   Smoking status: Never Smoker   Smokeless tobacco: Never Used  Substance Use Topics   Alcohol use: No   Drug use: No    Allergies as of 07/10/2019 - Review Complete 07/10/2019  Allergen Reaction Noted   Other Other (See Comments) 05/07/2014    Review of Systems:    All systems reviewed and negative except where noted in HPI.   Physical Exam:  Vital signs in last 24 hours: Vitals:   07/11/19 1213 07/11/19 2036 07/12/19 0503 07/12/19 1232  BP: (!) 143/72 (!) 160/88 (!) 154/80 (!) 147/78  Pulse: 74 80 75 72  Resp: 18 18  16   Temp: 98.3 F (36.8 C) 98 F (36.7 C) 98 F (36.7 C) 98.7 F (37.1 C)  TempSrc: Oral Oral Oral Oral  SpO2: 99% 99% 100% 100%  Weight:      Height:       Last BM Date: 07/12/19 General:   Pleasant, cooperative in NAD Head:  Normocephalic and atraumatic. Eyes:   No icterus.   Conjunctiva pink. PERRLA. Ears:  Normal auditory acuity. Neck:  Supple; no masses or thyroidomegaly Lungs: Respirations even and unlabored. Lungs clear to auscultation bilaterally.   No wheezes, crackles, or rhonchi.    Abdomen:  Soft, nondistended, nontender. Normal bowel sounds. No appreciable masses or hepatomegaly.  No rebound or guarding.  Neurologic:  Alert and oriented x3;  grossly normal neurologically. Skin:  Intact without significant lesions or rashes. Cervical Nodes:  No significant cervical adenopathy. Psych:  Alert and cooperative. Normal affect.  LAB RESULTS: Recent Labs    07/10/19 1438 07/12/19 0416  WBC 8.0 6.3  HGB 11.2* 9.8*  HCT 34.6* 30.6*  PLT 171 140*   BMET Recent Labs  07/10/19 1438 07/11/19 0507 07/12/19 0416  NA 139 144 142  K 4.8 4.4 4.8  CL 119* 123* 124*  CO2 14* 15* 15*  GLUCOSE 91 80 113*  BUN 77* 70* 58*  CREATININE 4.52* 3.77* 3.10*  CALCIUM 8.0* 7.6* 7.3*   LFT Recent Labs    07/10/19 1438  PROT 6.7  ALBUMIN 3.4*  AST 25  ALT 26  ALKPHOS 52  BILITOT 0.7   PT/INR No results for input(s): LABPROT, INR in the last 72 hours.  STUDIES: Ct Abdomen Pelvis Wo Contrast  Result Date: 07/11/2019 CLINICAL DATA:  Diarrhea EXAM: CT ABDOMEN AND PELVIS WITHOUT CONTRAST TECHNIQUE: Multidetector CT imaging of the abdomen and pelvis was performed following the standard protocol without IV contrast. COMPARISON:  02/16/2018 FINDINGS: Lower chest: Calcifications throughout the visualized right coronary artery. Heart is normal size. Aorta is normal caliber. Hepatobiliary: No mediastinal, hilar, or axillary adenopathy. Pancreas: No focal abnormality or ductal dilatation. Spleen: No focal abnormality.  Normal size. Adrenals/Urinary Tract: Bilateral perinephric stranding. No renal or adrenal mass. No hydronephrosis. Urinary bladder unremarkable. Stomach/Bowel: Normal appendix. Stomach, large and small bowel grossly unremarkable. Vascular/Lymphatic: Aortic atherosclerosis. No enlarged abdominal or pelvic lymph nodes. Reproductive: Mildly prominent prostate with central calcifications. Other: No free fluid or free air. Musculoskeletal: No acute bony abnormality.  IMPRESSION: No acute findings in the abdomen or pelvis. Aortic atherosclerosis, coronary artery disease. Electronically Signed   By: Rolm Baptise M.D.   On: 07/11/2019 18:19      Impression / Plan:   [name] [name] is a 58 y.o. y/o male with acute diarrhea, and AKI  Acute onset of this diarrhea could still be due to infectious causes despite negative infectious stool work-up  acute onset of the symptoms, with no chronic history, and no other history or symptoms of IBD chronically, are all consistent possibly infectious diarrhea that is now resolving.  CT scan, normal white count are all reassuring  Patient has never had a colonoscopy and should follow-up as an outpatient for screening colonoscopy.  His hemoglobin was also noted to have declined from baseline, with no active GI bleeding.  This will need further work-up anemia work-up as an inpatient or outpatient as well  If diarrhea continues to improve, it would be consistent with resolving likely infectious diarrhea.  However, if it does not improve, can consider colonoscopy at that time  Avoid any exacerbating meds  Fecal calprotectin and inflammatory markers ordered  Thank you for involving me in the care of this patient.      LOS: 0 days   Virgel Manifold, MD  07/12/2019, 4:27 PM

## 2019-07-12 NOTE — Progress Notes (Signed)
[place] [facility] [place] NAME: [name] [name]    MR#:  [id number]  DATE OF BIRTH:  1961-08-28  SUBJECTIVE:   Patient continues to have intermittent diarrhea overnight and renal function is slightly improved.  No other acute events overnight.  Patient denies any abdominal pain.  CT abdomen pelvis was negative for acute pathology yesterday.  REVIEW OF SYSTEMS:    Review of Systems  Constitutional: Negative for chills and fever.  HENT: Negative for congestion and tinnitus.   Eyes: Negative for blurred vision and double vision.  Respiratory: Negative for cough, shortness of breath and wheezing.   Cardiovascular: Negative for chest pain, orthopnea and PND.  Gastrointestinal: Positive for diarrhea. Negative for abdominal pain, nausea and vomiting.  Genitourinary: Negative for dysuria and hematuria.  Neurological: Positive for weakness (generalized). Negative for dizziness, sensory change and focal weakness.  All other systems reviewed and are negative.   Nutrition: Carb modified Tolerating Diet: Yes Tolerating PT: Ambulatory   DRUG ALLERGIES:   Allergies  Allergen Reactions  . Other Other (See Comments)    Pt. And wife state that he had a reaction to "some" antibiotic but they do not know what the name of it was.They are are very apprehensive about him receiving any antibiotic. Zosyn, Clindamycin, & Vancomycin (all started together unsure which caused reaction 07/18/2013)    VITALS:  Blood pressure (!) 147/78, pulse 72, temperature 98.7 F (37.1 C), temperature source Oral, resp. rate 16, height 6\' 1"  (1.854 m), weight 117.5 kg, SpO2 100 %.  PHYSICAL EXAMINATION:   Physical Exam  GENERAL:  58 y.o.-year-old obese patient lying in bed in no acute distress.  EYES: Pupils equal, round, reactive to light and accommodation. No scleral icterus. Extraocular muscles intact.  HEENT: Head atraumatic, normocephalic. Oropharynx and nasopharynx  clear.  NECK:  Supple, no jugular venous distention. No thyroid enlargement, no tenderness.  LUNGS: Normal breath sounds bilaterally, no wheezing, rales, rhonchi. No use of accessory muscles of respiration.  CARDIOVASCULAR: S1, S2 normal. No murmurs, rubs, or gallops.  ABDOMEN: Soft, nontender, nondistended. Bowel sounds present. No organomegaly or mass.  EXTREMITIES: No cyanosis, clubbing or edema b/l.    NEUROLOGIC: Cranial nerves II through XII are intact. No focal Motor or sensory deficits b/l. Globally weak.    PSYCHIATRIC: The patient is alert and oriented x 3.  SKIN: No obvious rash, lesion, or ulcer.    LABORATORY PANEL:   CBC Recent Labs  Lab 07/12/19 0416  WBC 6.3  HGB 9.8*  HCT 30.6*  PLT 140*   ------------------------------------------------------------------------------------------------------------------  Chemistries  Recent Labs  Lab 07/10/19 1438  07/12/19 0416  NA 139   < > 142  K 4.8   < > 4.8  CL 119*   < > 124*  CO2 14*   < > 15*  GLUCOSE 91   < > 113*  BUN 77*   < > 58*  CREATININE 4.52*   < > 3.10*  CALCIUM 8.0*   < > 7.3*  AST 25  --   --   ALT 26  --   --   ALKPHOS 52  --   --   BILITOT 0.7  --   --    < > = values in this interval not displayed.   ------------------------------------------------------------------------------------------------------------------  Cardiac Enzymes No results for input(s): TROPONINI in the last 168 hours. ------------------------------------------------------------------------------------------------------------------  RADIOLOGY:  Ct Abdomen Pelvis Wo Contrast  Result Date: 07/11/2019 CLINICAL DATA:  Diarrhea EXAM: CT ABDOMEN AND PELVIS WITHOUT CONTRAST TECHNIQUE: Multidetector CT imaging of the abdomen and pelvis was performed following the standard protocol without IV contrast. COMPARISON:  02/16/2018 FINDINGS: Lower chest: Calcifications throughout the visualized right coronary artery. Heart is normal size.  Aorta is normal caliber. Hepatobiliary: No mediastinal, hilar, or axillary adenopathy. Pancreas: No focal abnormality or ductal dilatation. Spleen: No focal abnormality.  Normal size. Adrenals/Urinary Tract: Bilateral perinephric stranding. No renal or adrenal mass. No hydronephrosis. Urinary bladder unremarkable. Stomach/Bowel: Normal appendix. Stomach, large and small bowel grossly unremarkable. Vascular/Lymphatic: Aortic atherosclerosis. No enlarged abdominal or pelvic lymph nodes. Reproductive: Mildly prominent prostate with central calcifications. Other: No free fluid or free air. Musculoskeletal: No acute bony abnormality. IMPRESSION: No acute findings in the abdomen or pelvis. Aortic atherosclerosis, coronary artery disease. Electronically Signed   By: Rolm Baptise M.D.   On: 07/11/2019 18:19     ASSESSMENT AND PLAN:   58 year old male with past medical history of diabetes, peripheral vascular disease, diabetic neuropathy, hypertension, ischemic cardiomyopathy, hyperlipidemia, CKD stage III, chronic systolic CHF who presented to the hospital due to acute diarrhea noted to be acute on chronic renal failure.  1.  Acute on chronic renal failure-patient's baseline creatinine around 2.4.  Patient presented to the hospital the creatinine of 4.5 and this was secondary to volume loss and acute diarrhea. -Patient continues to have intermittent diarrhea, creatinine proving with IV fluid hydration and down to 3.1 today. -Appreciate nephrology input and continue current care.  No acute need for dialysis presently.  2.  Acute diarrhea-etiology unclear.  Patient apparently has had this off for over a week.  Stool for C. difficile and comprehensive culture is negative.   Patient continues to have intermittent diarrhea which is leading to electrolyte derangements. - Continue Imodium as needed for the diarrhea.  Will get gastroenterology input. -CT abdomen pelvis yesterday was negative for acute pathology.   3.  Essential hypertension-continue carvedilol, hydralazine, Imdur  4.  Diabetes type 2 with CKD stage III- continue sliding scale insulin - BS stable.   5.  Chronic systolic CHF-clinically patient is not in congestive heart failure. -Hold patient's diuretics given the acute on chronic renal failure and volume loss.  Continue carvedilol.  Patient not on ACE inhibitor given his CKD.  6.  Hyperlipidemia-continue atorvastatin.  Discussed plan of care with pt at bedside   All the records are reviewed and case discussed with Care Management/Social Worker. Management plans discussed with the patient, family and they are in agreement.  CODE STATUS: Full code  DVT Prophylaxis: Heparin subcu  TOTAL TIME TAKING CARE OF THIS PATIENT: 35 minutes.   POSSIBLE D/C IN 1-2 DAYS, DEPENDING ON CLINICAL CONDITION.   Henreitta Leber M.D on 07/12/2019 at 4:35 PM  Between 7am to 6pm - Pager - [phone]  After 6pm go to [email or web address] - Proofreader  [facility]  Office  [phone]  CC: Primary care physician; Tracie Harrier, MD

## 2019-07-13 LAB — BASIC METABOLIC PANEL
Anion gap: 4 — ABNORMAL LOW (ref 5–15)
BUN: 53 mg/dL — ABNORMAL HIGH (ref 6–20)
CO2: 16 mmol/L — ABNORMAL LOW (ref 22–32)
Calcium: 7.4 mg/dL — ABNORMAL LOW (ref 8.9–10.3)
Chloride: 122 mmol/L — ABNORMAL HIGH (ref 98–111)
Creatinine, Ser: 3.02 mg/dL — ABNORMAL HIGH (ref 0.61–1.24)
GFR calc Af Amer: 26 mL/min — ABNORMAL LOW (ref >60)
GFR calc non Af Amer: 22 mL/min — ABNORMAL LOW (ref >60)
Glucose, Bld: 127 mg/dL — ABNORMAL HIGH (ref 70–99)
Potassium: 4.6 mmol/L (ref 3.5–5.1)
Sodium: 142 mmol/L (ref 135–145)

## 2019-07-13 LAB — C-REACTIVE PROTEIN: CRP: <0.8 mg/dL (ref <1.0)

## 2019-07-13 LAB — GLUCOSE, CAPILLARY: Glucose-Capillary: 104 mg/dL — ABNORMAL HIGH (ref 70–99)

## 2019-07-13 MED ORDER — LOPERAMIDE HCL 2 MG PO CAPS: 2.0000 mg | ORAL_CAPSULE | ORAL | 0 refills | Status: DC | PRN

## 2019-07-13 NOTE — Progress Notes (Signed)
[name] [name] to be D/C'd home per MD order.  Discussed prescriptions and follow up appointments with the patient. Prescriptions given to patient, medication list explained in detail. Pt verbalized understanding.  Allergies as of 07/13/2019      Reactions   Other Other (See Comments)   Pt. And wife state that he had a reaction to "some" antibiotic but they do not know what the name of it was.They are are very apprehensive about him receiving any antibiotic. Zosyn, Clindamycin, & Vancomycin (all started together unsure which caused reaction 07/18/2013)      Medication List    TAKE these medications   aspirin EC 81 MG tablet Take 81 mg by mouth daily.   atorvastatin 40 MG tablet Commonly known as: LIPITOR Take 1 tablet (40 mg total) by mouth at bedtime.   carvedilol 25 MG tablet Commonly known as: COREG Take 1 tablet (25 mg total) by mouth 2 (two) times daily with a meal.   ferrous sulfate 325 (65 FE) MG tablet Take 325 mg by mouth daily.   glimepiride 4 MG tablet Commonly known as: AMARYL Take 4 mg by mouth 2 (two) times daily with a meal.   hydrALAZINE 25 MG tablet Commonly known as: APRESOLINE Take 1 tablet (25 mg total) by mouth 2 (two) times daily.   HYDROcodone-acetaminophen 5-325 MG tablet Commonly known as: NORCO/VICODIN Take by mouth as needed.   isosorbide mononitrate 60 MG 24 hr tablet Commonly known as: IMDUR Take 0.5 tablets (30 mg total) by mouth daily.   loperamide 2 MG capsule Commonly known as: IMODIUM Take 1 capsule (2 mg total) by mouth every 4 (four) hours as needed for diarrhea or loose stools.   losartan 25 MG tablet Commonly known as: COZAAR Take 25 mg by mouth daily.   nitroGLYCERIN 0.4 MG SL tablet Commonly known as: Nitrostat Place 1 tablet (0.4 mg total) under the tongue every 5 (five) minutes as needed for chest pain.   sodium bicarbonate 650 MG tablet Take 1,300 mg by mouth 2 (two) times daily.   VITAMIN B 12 PO Take by mouth  daily.       Vitals:   07/12/19 2047 07/13/19 0552  BP: (!) 162/96 (!) 147/84  Pulse: 86 84  Resp: 20 20  Temp: 98.1 F (36.7 C) 98.6 F (37 C)  SpO2: 98% 97%    Skin clean, dry and intact without evidence of skin break down, no evidence of skin tears noted. IV catheter discontinued intact. Site without signs and symptoms of complications. Dressing and pressure applied. Pt denies pain at this time. No complaints noted.  An After Visit Summary was printed and given to the patient. Patient escorted via [place], and D/C home via private auto.  Chuck Hint RN [facility] [employer]

## 2019-07-13 NOTE — Progress Notes (Signed)
[facility], [place] 07/13/19  Subjective:   Patient known to our practice from outpatient follow-up He is followed for CKD stage IV.  His baseline creatinine from April is 2.4/GFR 29 He is admitted for profuse diarrhea causing dehydration Doing fair today Slight improvement in serum creatinine  Objective:  Vital signs in last 24 hours:  Temp:  [98.1 F (36.7 C)-98.6 F (37 C)] 98.6 F (37 C) (08/27 0552) Pulse Rate:  [84-86] 84 (08/27 0552) Resp:  [20] 20 (08/27 0552) BP: (147-162)/(84-96) 147/84 (08/27 0552) SpO2:  [97 %-98 %] 97 % (08/27 0552)  Weight change:  Filed Weights   07/10/19 1435  Weight: 117.5 kg    Intake/Output:    Intake/Output Summary (Last 24 hours) at 07/13/2019 1630 Last data filed at 07/13/2019 1044 Gross per 24 hour  Intake 990.15 ml  Output 1200 ml  Net -209.85 ml     Physical Exam: General: No acute distress  HEENT Anicteric, moist oral mucous membrane  Neck Supple, no mass  Pulm/lungs Clear to auscultation bilaterally  CVS/Heart Regular rate and rhythm  Abdomen:  Soft, nontender, obese  Extremities: Trace edema  Neurologic: Alert, oriented  Skin: No acute rashes    Basic Metabolic Panel:  Recent Labs  Lab 07/10/19 1438 07/11/19 0507 07/12/19 0416 07/13/19 0410  NA 139 144 142 142  K 4.8 4.4 4.8 4.6  CL 119* 123* 124* 122*  CO2 14* 15* 15* 16*  GLUCOSE 91 80 113* 127*  BUN 77* 70* 58* 53*  CREATININE 4.52* 3.77* 3.10* 3.02*  CALCIUM 8.0* 7.6* 7.3* 7.4*     CBC: Recent Labs  Lab 07/10/19 1438 07/12/19 0416  WBC 8.0 6.3  HGB 11.2* 9.8*  HCT 34.6* 30.6*  MCV 95.1 95.9  PLT 171 140*     No results found for: HEPBSAG, HEPBSAB, HEPBIGM    Microbiology:  Recent Results (from the past 240 hour(s))  SARS CORONAVIRUS 2 (TAT 6-12 HRS) Nasal Swab Aptima Multi Swab     Status: None   Collection Time: 07/10/19  3:59 PM   Specimen: Aptima Multi Swab; Nasal Swab  Result Value Ref Range  Status   SARS Coronavirus 2 NEGATIVE NEGATIVE Final    Comment: (NOTE) SARS-CoV-2 target nucleic acids are NOT DETECTED. The SARS-CoV-2 RNA is generally detectable in upper and lower respiratory specimens during the acute phase of infection. Negative results do not preclude SARS-CoV-2 infection, do not rule out co-infections with other pathogens, and should not be used as the sole basis for treatment or other patient management decisions. Negative results must be combined with clinical observations, patient history, and epidemiological information. The expected result is Negative. Fact Sheet for Patients: SugarRoll.be Fact Sheet for Healthcare Providers: https://www.woods-mathews.com/ This test is not yet approved or cleared by the [place] FDA and  has been authorized for detection and/or diagnosis of SARS-CoV-2 by FDA under an Emergency Use Authorization (EUA). This EUA will remain  in effect (meaning this test can be used) for the duration of the COVID-19 declaration under Section 56 4(b)(1) of the Act, 21 U.S.C. section 360bbb-3(b)(1), unless the authorization is terminated or revoked sooner. Performed at [facility], [place] [address] [place], [place] [address]   Gastrointestinal Panel by PCR , Stool     Status: None   Collection Time: 07/11/19  2:07 AM   Specimen: Stool  Result Value Ref Range Status   Campylobacter species NOT DETECTED NOT DETECTED Final   Plesimonas shigelloides NOT DETECTED  NOT DETECTED Final   Salmonella species NOT DETECTED NOT DETECTED Final   Yersinia enterocolitica NOT DETECTED NOT DETECTED Final   Vibrio species NOT DETECTED NOT DETECTED Final   Vibrio cholerae NOT DETECTED NOT DETECTED Final   Enteroaggregative E coli (EAEC) NOT DETECTED NOT DETECTED Final   Enteropathogenic E coli (EPEC) NOT DETECTED NOT DETECTED Final   Enterotoxigenic E coli (ETEC) NOT DETECTED NOT DETECTED Final   Shiga like  toxin producing E coli (STEC) NOT DETECTED NOT DETECTED Final   Shigella/Enteroinvasive E coli (EIEC) NOT DETECTED NOT DETECTED Final   Cryptosporidium NOT DETECTED NOT DETECTED Final   Cyclospora cayetanensis NOT DETECTED NOT DETECTED Final   Entamoeba histolytica NOT DETECTED NOT DETECTED Final   Giardia lamblia NOT DETECTED NOT DETECTED Final   Adenovirus F40/41 NOT DETECTED NOT DETECTED Final   Astrovirus NOT DETECTED NOT DETECTED Final   Norovirus GI/GII NOT DETECTED NOT DETECTED Final   Rotavirus A NOT DETECTED NOT DETECTED Final   Sapovirus (I, II, IV, and V) NOT DETECTED NOT DETECTED Final    Comment: Performed at [facility], [address] [place], [place] [address]  C Difficile Quick Screen w PCR reflex     Status: None   Collection Time: 07/11/19  2:07 AM   Specimen: STOOL  Result Value Ref Range Status   C Diff antigen NEGATIVE NEGATIVE Final   C Diff toxin NEGATIVE NEGATIVE Final   C Diff interpretation No C. difficile detected.  Final    Comment: Performed at [facility], [address] [place], [place] [address]    Coagulation Studies: No results for input(s): LABPROT, INR in the last 72 hours.  Urinalysis: No results for input(s): COLORURINE, LABSPEC, PHURINE, GLUCOSEU, HGBUR, BILIRUBINUR, KETONESUR, PROTEINUR, UROBILINOGEN, NITRITE, LEUKOCYTESUR in the last 72 hours.  Invalid input(s): APPERANCEUR    Imaging: Ct Abdomen Pelvis Wo Contrast  Result Date: 07/11/2019 CLINICAL DATA:  Diarrhea EXAM: CT ABDOMEN AND PELVIS WITHOUT CONTRAST TECHNIQUE: Multidetector CT imaging of the abdomen and pelvis was performed following the standard protocol without IV contrast. COMPARISON:  02/16/2018 FINDINGS: Lower chest: Calcifications throughout the visualized right coronary artery. Heart is normal size. Aorta is normal caliber. Hepatobiliary: No mediastinal, hilar, or axillary adenopathy. Pancreas: No focal abnormality or ductal dilatation. Spleen: No  focal abnormality.  Normal size. Adrenals/Urinary Tract: Bilateral perinephric stranding. No renal or adrenal mass. No hydronephrosis. Urinary bladder unremarkable. Stomach/Bowel: Normal appendix. Stomach, large and small bowel grossly unremarkable. Vascular/Lymphatic: Aortic atherosclerosis. No enlarged abdominal or pelvic lymph nodes. Reproductive: Mildly prominent prostate with central calcifications. Other: No free fluid or free air. Musculoskeletal: No acute bony abnormality. IMPRESSION: No acute findings in the abdomen or pelvis. Aortic atherosclerosis, coronary artery disease. Electronically Signed   By: Rolm Baptise M.D.   On: 07/11/2019 18:19     Medications:   . sodium chloride 75 mL/hr at 07/13/19 0320   . aspirin EC  81 mg Oral Daily  . atorvastatin  40 mg Oral QHS  . carvedilol  25 mg Oral BID WC  . ferrous sulfate  325 mg Oral Daily  . heparin  5,000 Units Subcutaneous Q8H  . hydrALAZINE  25 mg Oral BID  . insulin aspart  0-5 Units Subcutaneous QHS  . insulin aspart  0-9 Units Subcutaneous TID WC  . isosorbide mononitrate  30 mg Oral Daily  . sodium bicarbonate  1,300 mg Oral BID  . sodium chloride flush  3 mL Intravenous Once   HYDROcodone-acetaminophen, loperamide, nitroGLYCERIN  Assessment/ Plan:  58 y.o. male With poorly controlled diabetes, hypertension, history of osteomyelitis, chronic systolic CHF, chronic kidney disease is admitted for dehydration due to diarrhea  1.  Acute kidney injury. CKD st 4,  Baseline creatinine of 2.4/GFR 29 from April 2020.  Creatinine at the time of admission was 4.5 and improved to 3.02 today with IV hydration suggesting volume depletion Continue adequate oral intake Work-up for diarrhea is in progress. C Diff and stool GI  panel are negative Follow-up with GI as outpatient We will arrange nephrology follow-up   LOS: [place] 8/27/20204:30 PM  [place], [place]  Note: This  note was prepared with Dragon dictation. Any transcription errors are unintentional

## 2019-07-13 NOTE — Discharge Summary (Signed)
[facility] [place] NAME: [name] [name]    MR#:  [id number]  DATE OF BIRTH:  Oct 20, 1961  DATE OF ADMISSION:  07/10/2019 ADMITTING PHYSICIAN: [name] Snow, NP  DATE OF DISCHARGE: 07/13/2019 12:28 PM  PRIMARY CARE PHYSICIAN: [name] Harrier, MD    ADMISSION DIAGNOSIS:  Dehydration [E86.0] Acute renal failure superimposed on chronic kidney disease, unspecified CKD stage, unspecified acute renal failure type ([place]) [N17.9, N18.9]  DISCHARGE DIAGNOSIS:  Active Problems:   Acute renal failure superimposed on stage 4 chronic kidney disease ([place])   Acute renal failure (ARF) ([place])   SECONDARY DIAGNOSIS:   Past Medical History:  Diagnosis Date  . CAD (coronary artery disease)    a. 04/2014 CABG x 4 (LIMA->LAD, VG->Diag, VG->OM, VG->PDA).  . Chronic systolic CHF (congestive heart failure) ([place])    a. 07/2014 Echo: EF 30-35%.  . CKD (chronic kidney disease), stage III ([place])    a. 12/2015 Creat 1.8.  . Diabetic foot ulcers ([place])    a. left foot 2nd digit ant 5 th digit 05/03/14; b. 08/2014 s/p amputation of toes on L foot.  . Hypercholesterolemia    a. 12/2015 TC 116, TG 154, HDL 24, LDL 61-->Atorvastatin 40.  Marland Kitchen Hypertension   . Hypertensive heart disease   . Ischemic cardiomyopathy    a. 07/2013 Echo: EF 20%; b. 07/2014 Echo: EF 30-35%, diff HK, Gr1 DD, mildly dil LA, nl PASP.  Marland Kitchen Myocardial infarction ([place])   . Neuropathy in diabetes ([place])   . Open wound    foot  . Orthostatic hypotension   . Peripheral vascular disease ([place])   . Type II diabetes mellitus ([place])    a. 12/2015 HbA1c = 13.9.    HOSPITAL COURSE:   58 year old male with past medical history of diabetes, peripheral vascular disease, diabetic neuropathy, hypertension, ischemic cardiomyopathy, hyperlipidemia, CKD stage III, chronic systolic CHF who presented to the hospital due to acute diarrhea noted to be acute on chronic renal failure.  1.  Acute on chronic renal  failure-patient's baseline creatinine around 2.4.  Patient presented to the hospital the creatinine of 4.5 and this was secondary to volume loss and acute diarrhea. Patient was hydrated with IV fluids and patient's creatinine trended down and is down to 3.1 upon discharge. -Seen by nephrology and agreed with this management and will continue follow-up with him as outpatient. -Patient has underlying CKD stage III.  2.  Acute diarrhea- thought to be infectious in nature.  Patient stool for C. difficile and comprehensive culture were negative.  He continued to have intermittent diarrhea and therefore started on Imodium and the diarrhea has improved.  Gastroenterology consult was also obtained.  CT abdomen pelvis was negative for acute pathology.  GI did not recommend any acute intervention while in the hospital but recommended outpatient follow-up.  He is improved therefore being discharged on oral Imodium as needed.  3.  Essential hypertension-pt. Will continue carvedilol, hydralazine, Imdur  4.  Diabetes type 2 with CKD stage III-  while in the hospital patient was on sliding scale insulin but will resume his Amaryl upon discharge.  5.  Chronic systolic CHF-clinically patient was not in congestive heart failure while in hospital.  -Continue carvedilol, losartan, Imdur, hydralazine  6.  Hyperlipidemia- pt. Will continue atorvastatin.  DISCHARGE CONDITIONS:   Stable  CONSULTS OBTAINED:  Treatment Team:  Virgel Manifold, MD  DRUG ALLERGIES:   Allergies  Allergen Reactions  . Other Other (See Comments)  Pt. And wife state that he had a reaction to "some" antibiotic but they do not know what the name of it was.They are are very apprehensive about him receiving any antibiotic. Zosyn, Clindamycin, & Vancomycin (all started together unsure which caused reaction 07/18/2013)    DISCHARGE MEDICATIONS:   Allergies as of 07/13/2019      Reactions   Other Other (See Comments)   Pt. And  wife state that he had a reaction to "some" antibiotic but they do not know what the name of it was.They are are very apprehensive about him receiving any antibiotic. Zosyn, Clindamycin, & Vancomycin (all started together unsure which caused reaction 07/18/2013)      Medication List    TAKE these medications   aspirin EC 81 MG tablet Take 81 mg by mouth daily.   atorvastatin 40 MG tablet Commonly known as: LIPITOR Take 1 tablet (40 mg total) by mouth at bedtime.   carvedilol 25 MG tablet Commonly known as: COREG Take 1 tablet (25 mg total) by mouth 2 (two) times daily with a meal.   ferrous sulfate 325 (65 FE) MG tablet Take 325 mg by mouth daily.   glimepiride 4 MG tablet Commonly known as: AMARYL Take 4 mg by mouth 2 (two) times daily with a meal.   hydrALAZINE 25 MG tablet Commonly known as: APRESOLINE Take 1 tablet (25 mg total) by mouth 2 (two) times daily.   HYDROcodone-acetaminophen 5-325 MG tablet Commonly known as: NORCO/VICODIN Take by mouth as needed.   isosorbide mononitrate 60 MG 24 hr tablet Commonly known as: IMDUR Take 0.5 tablets (30 mg total) by mouth daily.   loperamide 2 MG capsule Commonly known as: IMODIUM Take 1 capsule (2 mg total) by mouth every 4 (four) hours as needed for diarrhea or loose stools.   losartan 25 MG tablet Commonly known as: COZAAR Take 25 mg by mouth daily.   nitroGLYCERIN 0.4 MG SL tablet Commonly known as: Nitrostat Place 1 tablet (0.4 mg total) under the tongue every 5 (five) minutes as needed for chest pain.   sodium bicarbonate 650 MG tablet Take 1,300 mg by mouth 2 (two) times daily.   VITAMIN B 12 PO Take by mouth daily.         DISCHARGE INSTRUCTIONS:   DIET:  Cardiac diet, Diabetic diet and Renal diet  DISCHARGE CONDITION:  Stable  ACTIVITY:  Activity as tolerated  OXYGEN:  Home Oxygen: No.   Oxygen Delivery: room air  DISCHARGE LOCATION:  home   If you experience worsening of your  admission symptoms, develop shortness of breath, life threatening emergency, suicidal or homicidal thoughts you must seek medical attention immediately by calling 911 or calling your MD immediately  if symptoms less severe.  You Must read complete instructions/literature along with all the possible adverse reactions/side effects for all the Medicines you take and that have been prescribed to you. Take any new Medicines after you have completely understood and accpet all the possible adverse reactions/side effects.   Please note  You were cared for by a hospitalist during your hospital stay. If you have any questions about your discharge medications or the care you received while you were in the hospital after you are discharged, you can call the unit and asked to speak with the hospitalist on call if the hospitalist that took care of you is not available. Once you are discharged, your primary care physician will handle any further medical issues. Please note that NO REFILLS for  any discharge medications will be authorized once you are discharged, as it is imperative that you return to your primary care physician (or establish a relationship with a primary care physician if you do not have one) for your aftercare needs so that they can reassess your need for medications and monitor your lab values.     Today   Renal function close to baseline.  Diarrhea has improved with as needed Imodium.  Seen by gastroenterology who did not recommend a colonoscopy or further work-up while in the hospital.  Will discharge home with outpatient follow-up.  VITAL SIGNS:  Blood pressure (!) 147/84, pulse 84, temperature 98.6 F (37 C), temperature source Oral, resp. rate 20, height 6\' 1"  (1.854 m), weight 117.5 kg, SpO2 97 %.  I/O:    Intake/Output Summary (Last 24 hours) at 07/13/2019 1601 Last data filed at 07/13/2019 1044 Gross per 24 hour  Intake 990.15 ml  Output 1200 ml  Net -209.85 ml    PHYSICAL  EXAMINATION:   GENERAL:  58 y.o.-year-old obese patient lying in bed in no acute distress.  EYES: Pupils equal, round, reactive to light and accommodation. No scleral icterus. Extraocular muscles intact.  HEENT: Head atraumatic, normocephalic. Oropharynx and nasopharynx clear.  NECK:  Supple, no jugular venous distention. No thyroid enlargement, no tenderness.  LUNGS: Normal breath sounds bilaterally, no wheezing, rales, rhonchi. No use of accessory muscles of respiration.  CARDIOVASCULAR: S1, S2 normal. No murmurs, rubs, or gallops.  ABDOMEN: Soft, nontender, nondistended. Bowel sounds present. No organomegaly or mass.  EXTREMITIES: No cyanosis, clubbing or edema b/l.    NEUROLOGIC: Cranial nerves II through XII are intact. No focal Motor or sensory deficits b/l.    PSYCHIATRIC: The patient is alert and oriented x 3.  SKIN: No obvious rash, lesion, or ulcer.   DATA REVIEW:   CBC Recent Labs  Lab 07/12/19 0416  WBC 6.3  HGB 9.8*  HCT 30.6*  PLT 140*    Chemistries  Recent Labs  Lab 07/10/19 1438  07/13/19 0410  NA 139   < > 142  K 4.8   < > 4.6  CL 119*   < > 122*  CO2 14*   < > 16*  GLUCOSE 91   < > 127*  BUN 77*   < > 53*  CREATININE 4.52*   < > 3.02*  CALCIUM 8.0*   < > 7.4*  AST 25  --   --   ALT 26  --   --   ALKPHOS 52  --   --   BILITOT 0.7  --   --    < > = values in this interval not displayed.    Cardiac Enzymes No results for input(s): TROPONINI in the last 168 hours.  Microbiology Results  Results for orders placed or performed during the hospital encounter of 07/10/19  SARS CORONAVIRUS 2 (TAT 6-12 HRS) Nasal Swab Aptima Multi Swab     Status: None   Collection Time: 07/10/19  3:59 PM   Specimen: Aptima Multi Swab; Nasal Swab  Result Value Ref Range Status   SARS Coronavirus 2 NEGATIVE NEGATIVE Final    Comment: (NOTE) SARS-CoV-2 target nucleic acids are NOT DETECTED. The SARS-CoV-2 RNA is generally detectable in upper and lower respiratory  specimens during the acute phase of infection. Negative results do not preclude SARS-CoV-2 infection, do not rule out co-infections with other pathogens, and should not be used as the sole basis for treatment or other patient management  decisions. Negative results must be combined with clinical observations, patient history, and epidemiological information. The expected result is Negative. Fact Sheet for Patients: SugarRoll.be Fact Sheet for Healthcare Providers: https://www.woods-mathews.com/ This test is not yet approved or cleared by the [place] FDA and  has been authorized for detection and/or diagnosis of SARS-CoV-2 by FDA under an Emergency Use Authorization (EUA). This EUA will remain  in effect (meaning this test can be used) for the duration of the COVID-19 declaration under Section 56 4(b)(1) of the Act, 21 U.S.C. section 360bbb-3(b)(1), unless the authorization is terminated or revoked sooner. Performed at [facility], [place] [address] [place], [place] [address]   Gastrointestinal Panel by PCR , Stool     Status: None   Collection Time: 07/11/19  2:07 AM   Specimen: Stool  Result Value Ref Range Status   Campylobacter species NOT DETECTED NOT DETECTED Final   Plesimonas shigelloides NOT DETECTED NOT DETECTED Final   Salmonella species NOT DETECTED NOT DETECTED Final   Yersinia enterocolitica NOT DETECTED NOT DETECTED Final   Vibrio species NOT DETECTED NOT DETECTED Final   Vibrio cholerae NOT DETECTED NOT DETECTED Final   Enteroaggregative E coli (EAEC) NOT DETECTED NOT DETECTED Final   Enteropathogenic E coli (EPEC) NOT DETECTED NOT DETECTED Final   Enterotoxigenic E coli (ETEC) NOT DETECTED NOT DETECTED Final   Shiga like toxin producing E coli (STEC) NOT DETECTED NOT DETECTED Final   Shigella/Enteroinvasive E coli (EIEC) NOT DETECTED NOT DETECTED Final   Cryptosporidium NOT DETECTED NOT DETECTED Final   Cyclospora  cayetanensis NOT DETECTED NOT DETECTED Final   Entamoeba histolytica NOT DETECTED NOT DETECTED Final   Giardia lamblia NOT DETECTED NOT DETECTED Final   Adenovirus F40/41 NOT DETECTED NOT DETECTED Final   Astrovirus NOT DETECTED NOT DETECTED Final   Norovirus GI/GII NOT DETECTED NOT DETECTED Final   Rotavirus A NOT DETECTED NOT DETECTED Final   Sapovirus (I, II, IV, and V) NOT DETECTED NOT DETECTED Final    Comment: Performed at [facility], [address] [place], [place] [address]  C Difficile Quick Screen w PCR reflex     Status: None   Collection Time: 07/11/19  2:07 AM   Specimen: STOOL  Result Value Ref Range Status   C Diff antigen NEGATIVE NEGATIVE Final   C Diff toxin NEGATIVE NEGATIVE Final   C Diff interpretation No C. difficile detected.  Final    Comment: Performed at [facility], [address] [place], [place] [address]    RADIOLOGY:  Ct Abdomen Pelvis Wo Contrast  Result Date: 07/11/2019 CLINICAL DATA:  Diarrhea EXAM: CT ABDOMEN AND PELVIS WITHOUT CONTRAST TECHNIQUE: Multidetector CT imaging of the abdomen and pelvis was performed following the standard protocol without IV contrast. COMPARISON:  02/16/2018 FINDINGS: Lower chest: Calcifications throughout the visualized right coronary artery. Heart is normal size. Aorta is normal caliber. Hepatobiliary: No mediastinal, hilar, or axillary adenopathy. Pancreas: No focal abnormality or ductal dilatation. Spleen: No focal abnormality.  Normal size. Adrenals/Urinary Tract: Bilateral perinephric stranding. No renal or adrenal mass. No hydronephrosis. Urinary bladder unremarkable. Stomach/Bowel: Normal appendix. Stomach, large and small bowel grossly unremarkable. Vascular/Lymphatic: Aortic atherosclerosis. No enlarged abdominal or pelvic lymph nodes. Reproductive: Mildly prominent prostate with central calcifications. Other: No free fluid or free air. Musculoskeletal: No acute bony abnormality. IMPRESSION: No  acute findings in the abdomen or pelvis. Aortic atherosclerosis, coronary artery disease. Electronically Signed   By: Rolm Baptise M.D.   On: 07/11/2019  18:19      Management plans discussed with the patient, family and they are in agreement.  CODE STATUS:     Code Status Orders  (From admission, onward)         Start     Ordered   07/10/19 1634  Full code  Continuous     07/10/19 1636         TOTAL TIME TAKING CARE OF THIS PATIENT: 40 minutes.    Henreitta Leber M.D on 07/13/2019 at 4:01 PM  Between 7am to 6pm - Pager - [phone]  After 6pm go to [email or web address] - Proofreader  [facility]  Office  [phone]  CC: Primary care physician; [name] Harrier, MD

## 2019-07-18 ENCOUNTER — Telehealth: Payer: Self-pay | Admitting: Gastroenterology

## 2019-07-18 ENCOUNTER — Encounter: Payer: Self-pay | Admitting: Gastroenterology

## 2019-07-18 ENCOUNTER — Other Ambulatory Visit: Payer: Self-pay

## 2019-07-18 ENCOUNTER — Ambulatory Visit (INDEPENDENT_AMBULATORY_CARE_PROVIDER_SITE_OTHER): Payer: Medicare HMO | Admitting: Gastroenterology

## 2019-07-18 DIAGNOSIS — K5900 Constipation, unspecified: Secondary | ICD-10-CM | POA: Diagnosis not present

## 2019-07-18 DIAGNOSIS — Z1211 Encounter for screening for malignant neoplasm of colon: Secondary | ICD-10-CM

## 2019-07-18 DIAGNOSIS — K219 Gastro-esophageal reflux disease without esophagitis: Secondary | ICD-10-CM | POA: Diagnosis not present

## 2019-07-18 MED ORDER — NA SULFATE-K SULFATE-MG SULF 17.5-3.13-1.6 GM/177ML PO SOLN: 354.0000 mL | Freq: Once | ORAL | 0 refills | Status: AC

## 2019-07-18 MED ORDER — POLYETHYLENE GLYCOL 3350 17 G PO PACK: 17.0000 g | PACK | Freq: Every day | ORAL | 0 refills | Status: DC

## 2019-07-18 MED ORDER — FAMOTIDINE 20 MG PO TABS: 20.0000 mg | ORAL_TABLET | Freq: Every day | ORAL | 1 refills | Status: DC

## 2019-07-18 NOTE — Patient Instructions (Signed)

## 2019-07-18 NOTE — Progress Notes (Signed)
Vonda Antigua, MD [address]  [place]  [place], [place] [address]  Main: [phone]  Fax: [phone]   Primary Care Physician: Tracie Harrier, MD  Virtual Visit via Video Note  I connected with patient on 07/18/19 at  1:30 PM EDT by video (using [email or web address]) and verified that I am speaking with the correct person using two identifiers.   I discussed the limitations, risks, security and privacy concerns of performing an evaluation and management service by video and the availability of in person appointments. I also discussed with the patient that there may be a patient responsible charge related to this service. The patient expressed understanding and agreed to proceed.  Location of Patient: Home Location of Provider: Home Persons involved: Patient and provider only (Nursing staff checked in patient via phone but were not physically involved in the video interaction - see their notes)   History of Present Illness: Chief Complaint  Patient presents with  . Hospitalization Follow-up  . [facility]    Patient had some constipation for 5 days. States yesterday went twice. Patient is keeping food down and drinking well.     HPI: [name] [name] is a 58 y.o. male being seen for follow-up of diarrhea.  Has not had any further diarrhea since hospital discharge.  In fact, he has not had a bowel movement in 3 to 4 days and finally went yesterday with 2 soft bowel movements.  No blood.  No abdominal pain.  No nausea or vomiting.  He states his normal bowel movements, only occur after 3 to 4 days and this has been the case for over a year.  Also reports chronic abdominal bloating intermittently.  No dysphagia.  Reports heartburn once a day for the last week.  Has been using Tums daily.  No dysphagia.  No family history of colon cancer.  No prior EGD or colonoscopy.  Current Outpatient Medications  Medication Sig Dispense Refill  . aspirin EC 81 MG tablet Take 81 mg by mouth  daily.    Marland Kitchen atorvastatin (LIPITOR) 40 MG tablet Take 1 tablet (40 mg total) by mouth at bedtime. 90 tablet 3  . carvedilol (COREG) 25 MG tablet Take 1 tablet (25 mg total) by mouth 2 (two) times daily with a meal. 180 tablet 3  . Cyanocobalamin (VITAMIN B 12 PO) Take by mouth daily.    . ferrous sulfate 325 (65 FE) MG tablet Take 325 mg by mouth daily.    Marland Kitchen glimepiride (AMARYL) 4 MG tablet Take 4 mg by mouth 2 (two) times daily with a meal.     . hydrALAZINE (APRESOLINE) 25 MG tablet Take 1 tablet (25 mg total) by mouth 2 (two) times daily. 180 tablet 3  . HYDROcodone-acetaminophen (NORCO/VICODIN) 5-325 MG tablet Take by mouth as needed.    . isosorbide mononitrate (IMDUR) 60 MG 24 hr tablet Take 0.5 tablets (30 mg total) by mouth daily. 90 tablet 3  . loperamide (IMODIUM) 2 MG capsule Take 1 capsule (2 mg total) by mouth every 4 (four) hours as needed for diarrhea or loose stools. 30 capsule 0  . losartan (COZAAR) 25 MG tablet Take 25 mg by mouth daily.     . nitroGLYCERIN (NITROSTAT) 0.4 MG SL tablet Place 1 tablet (0.4 mg total) under the tongue every 5 (five) minutes as needed for chest pain. 30 tablet 1  . sodium bicarbonate 650 MG tablet Take 1,300 mg by mouth 2 (two) times daily.  No current facility-administered medications for this visit.     Allergies as of 07/18/2019 - Review Complete 07/18/2019  Allergen Reaction Noted  . Other Other (See Comments) 05/07/2014    Review of Systems:    All systems reviewed and negative except where noted in HPI.   Observations/Objective:  Labs: CMP     Component Value Date/Time   NA 142 07/13/2019 0410   NA 136 08/28/2014 0507   K 4.6 07/13/2019 0410   K 4.1 08/28/2014 0507   CL 122 (H) 07/13/2019 0410   CL 106 08/28/2014 0507   CO2 16 (L) 07/13/2019 0410   CO2 22 08/28/2014 0507   GLUCOSE 127 (H) 07/13/2019 0410   GLUCOSE 145 (H) 08/28/2014 0507   BUN 53 (H) 07/13/2019 0410   BUN 25 (H) 08/28/2014 0507   CREATININE 3.02 (H)  07/13/2019 0410   CREATININE 1.57 (H) 08/28/2014 0507   CALCIUM 7.4 (L) 07/13/2019 0410   CALCIUM 7.6 (L) 08/28/2014 0507   PROT 6.7 07/10/2019 1438   PROT 8.4 (H) 08/24/2014 1256   ALBUMIN 3.4 (L) 07/10/2019 1438   ALBUMIN 3.1 (L) 08/24/2014 1256   AST 25 07/10/2019 1438   AST 11 (L) 08/24/2014 1256   ALT 26 07/10/2019 1438   ALT 20 08/24/2014 1256   ALKPHOS 52 07/10/2019 1438   ALKPHOS 68 08/24/2014 1256   BILITOT 0.7 07/10/2019 1438   BILITOT 0.4 08/24/2014 1256   GFRNONAA 22 (L) 07/13/2019 0410   GFRNONAA 50 (L) 08/28/2014 0507   GFRNONAA 35 (L) 09/19/2013 1338   GFRAA 26 (L) 07/13/2019 0410   GFRAA >60 08/28/2014 0507   GFRAA 41 (L) 09/19/2013 1338   Lab Results  Component Value Date   WBC 6.3 07/12/2019   HGB 9.8 (L) 07/12/2019   HCT 30.6 (L) 07/12/2019   MCV 95.9 07/12/2019   PLT 140 (L) 07/12/2019    Imaging Studies: Ct Abdomen Pelvis Wo Contrast  Result Date: 07/11/2019 CLINICAL DATA:  Diarrhea EXAM: CT ABDOMEN AND PELVIS WITHOUT CONTRAST TECHNIQUE: Multidetector CT imaging of the abdomen and pelvis was performed following the standard protocol without IV contrast. COMPARISON:  02/16/2018 FINDINGS: Lower chest: Calcifications throughout the visualized right coronary artery. Heart is normal size. Aorta is normal caliber. Hepatobiliary: No mediastinal, hilar, or axillary adenopathy. Pancreas: No focal abnormality or ductal dilatation. Spleen: No focal abnormality.  Normal size. Adrenals/Urinary Tract: Bilateral perinephric stranding. No renal or adrenal mass. No hydronephrosis. Urinary bladder unremarkable. Stomach/Bowel: Normal appendix. Stomach, large and small bowel grossly unremarkable. Vascular/Lymphatic: Aortic atherosclerosis. No enlarged abdominal or pelvic lymph nodes. Reproductive: Mildly prominent prostate with central calcifications. Other: No free fluid or free air. Musculoskeletal: No acute bony abnormality. IMPRESSION: No acute findings in the abdomen or  pelvis. Aortic atherosclerosis, coronary artery disease. Electronically Signed   By: Rolm Baptise M.D.   On: 07/11/2019 18:19    Assessment and Plan:   [name] [name] is a 58 y.o. y/o male here for follow-up of diarrhea  Assessment and Plan: Diarrhea has completely resolved Patient is chronically constipated and is back to his baseline  High-fiber diet MiraLAX daily with goal of 1-2 soft bowel movements daily.  I have asked him to start this after 1 to 2 weeks as he was just in the hospital with diarrhea.  If not at goal, patient instructed to increase dose to twice daily.  If loose stools with the medication, patient asked to decrease the medication to every other day, or half dose daily.  Patient verbalized understanding  His abdominal bloating is likely a result of his constipation however, will obtain H. pylori breath test as well  Patient has chronic anemia and states he takes iron Last ferritin was normal in February 2020, available in care everywhere  However, hemoglobin was 9 which is below his baseline at the hospital, will repeat along with iron panel  Patient has a nephrologist and is planning on following up with them in regard to his AKI on CKD during the hospital admission  He is agreeable to schedule his screening colonoscopy which would also allow [place] to rule out any underlying lesions  We will schedule at least 4 to 6 weeks out  If symptoms recur I have asked him to notify us  I have discussed alternative options, risks & benefits,  which include, but are not limited to, bleeding, infection, perforation,respiratory complication & drug reaction.  The patient agrees with this plan & written consent will be obtained.    Patient educated extensively on acid reflux lifestyle modification, including buying a bed wedge, not eating 3 hrs before bedtime, diet modifications, and handout given for the same.   We will also prescribe Pepcid once daily   Follow Up  Instructions: Follow-up in 1 to 2 months   I discussed the assessment and treatment plan with the patient. The patient was provided an opportunity to ask questions and all were answered. The patient agreed with the plan and demonstrated an understanding of the instructions.   The patient was advised to call back or seek an in-person evaluation if the symptoms worsen or if the condition fails to improve as anticipated.  I provided 20 minutes of face-to-face time via video software during this encounter. Additional time was spent in reviewing patient's chart, placing orders etc.   Virgel Manifold, MD  Speech recognition software was used to dictate this note.

## 2019-07-19 ENCOUNTER — Other Ambulatory Visit
Admission: RE | Admit: 2019-07-19 | Discharge: 2019-07-19 | Disposition: A | Discharge status: Home / Self Care | Payer: Medicare HMO | Source: Ambulatory Visit | Attending: Gastroenterology | Admitting: Gastroenterology

## 2019-07-19 DIAGNOSIS — K5900 Constipation, unspecified: Secondary | ICD-10-CM | POA: Insufficient documentation

## 2019-07-19 DIAGNOSIS — K219 Gastro-esophageal reflux disease without esophagitis: Secondary | ICD-10-CM | POA: Diagnosis not present

## 2019-07-19 LAB — CBC
HCT: 27.2 % — ABNORMAL LOW (ref 39.0–52.0)
Hemoglobin: 9.1 g/dL — ABNORMAL LOW (ref 13.0–17.0)
MCH: 30.5 pg (ref 26.0–34.0)
MCHC: 33.5 g/dL (ref 30.0–36.0)
MCV: 91.3 fL (ref 80.0–100.0)
Platelets: 162 10*3/uL (ref 150–400)
RBC: 2.98 MIL/uL — ABNORMAL LOW (ref 4.22–5.81)
RDW: 13.3 % (ref 11.5–15.5)
WBC: 8.1 10*3/uL (ref 4.0–10.5)
nRBC: 0 % (ref 0.0–0.2)

## 2019-07-19 LAB — IRON AND TIBC
Iron: 21 ug/dL — ABNORMAL LOW (ref 45–182)
Saturation Ratios: 13 % — ABNORMAL LOW (ref 17.9–39.5)
TIBC: 163 ug/dL — ABNORMAL LOW (ref 250–450)
UIBC: 142 ug/dL

## 2019-07-19 LAB — FERRITIN: Ferritin: 179 ng/mL (ref 24–336)

## 2019-07-20 LAB — H. PYLORI BREATH TEST: H. pylori UBiT: NEGATIVE

## 2019-07-20 NOTE — Telephone Encounter (Signed)
Pt left vm to see if his results are back

## 2019-07-20 NOTE — Telephone Encounter (Signed)
Informed patient that blood work is not back yet and that when it was back we would call him or send blood work to [facility]. Patient verbalized understanding

## 2019-07-21 DIAGNOSIS — E1122 Type 2 diabetes mellitus with diabetic chronic kidney disease: Secondary | ICD-10-CM | POA: Diagnosis not present

## 2019-07-21 DIAGNOSIS — I739 Peripheral vascular disease, unspecified: Secondary | ICD-10-CM | POA: Diagnosis not present

## 2019-07-21 DIAGNOSIS — D649 Anemia, unspecified: Secondary | ICD-10-CM | POA: Diagnosis not present

## 2019-07-21 DIAGNOSIS — M545 Low back pain: Secondary | ICD-10-CM | POA: Diagnosis not present

## 2019-07-21 DIAGNOSIS — Z09 Encounter for follow-up examination after completed treatment for conditions other than malignant neoplasm: Secondary | ICD-10-CM | POA: Diagnosis not present

## 2019-07-21 DIAGNOSIS — I25810 Atherosclerosis of coronary artery bypass graft(s) without angina pectoris: Secondary | ICD-10-CM | POA: Diagnosis not present

## 2019-07-21 DIAGNOSIS — R197 Diarrhea, unspecified: Secondary | ICD-10-CM | POA: Diagnosis not present

## 2019-07-21 DIAGNOSIS — Z6836 Body mass index (BMI) 36.0-36.9, adult: Secondary | ICD-10-CM | POA: Diagnosis not present

## 2019-07-21 DIAGNOSIS — I255 Ischemic cardiomyopathy: Secondary | ICD-10-CM | POA: Diagnosis not present

## 2019-07-24 NOTE — Progress Notes (Deleted)
Cardiology Office Note    Date:  07/24/2019   ID:  [name] [name], DOB [birth year], MRN [id number]  PCP:  Tracie Harrier, MD  Cardiologist:  Ida Rogue, MD  Electrophysiologist:  Virl Axe, MD   Chief Complaint: Follow up  History of Present Illness:   [name] [name] is a 58 y.o. male with history of CAD s/p 4-vessel CABG in 04/2014 with a LIMA to LAD, SVG to diagonal, SVG to OM, and SVG to PDA, HFrEF secondary to ICM, NSVT, paroxysmal SVT, PVD,poorly controlledinsulin-dependentdiabetes with diabetic foot ulcers and diabetic peripheral neuropathyrequiring amputations of the left toe/foot,osteomyelitis,CKD stage IV with prior hyperkalemia, HTN, HLD, asymptomaticorthostatic hypotension, and medication compliance issues secondary to finances who presents forfollow-up of recent admission to [facility] from 8/24 through 8/27 for watery diarrhea with associated dehydration, hypotension, and acute on chronic CKD.  Prior echo in 07/2013 showed an EF of 20%. Stress testing undertaken at that time showed a fixed inferior and septal wall large defect without much reversibility which was felt to be due to possible prior silent MI in the LAD and RCA territory without ischemia. This was a moderate risk scan.Patient subsequently underwent cardiac cath in 03/2014 that showed 40% stenosis in the mid left main, 70% stenosis in the mid LAD, 70% stenosis in D2, 80% proximal LCx stenosis, 80% mid LCx stenosis, 99% proximal RCA stenosis, R PLA 99% stenosis. EF was estimated to be 40%. Following this, he proceeded to have four-vessel CABG as outlined above. Follow-up echo in 05/2017 showed an improved EF to 40 to 45%, unable to exclude regional wall motion abnormalities, grade 1 diastolic dysfunction, mild mitral regurgitation, mildly dilated left atrium, RV systolic function normal, PASP normal. Patient underwent repeat ischemic evaluation via nuclear stress test in 02/2018 showed a large defect of  moderate severity present in the basal inferior, basal inferolateral, mid inferior, mid inferolateral, and apical inferior location. These findings were consistent with prior MI with mild peri-infarct ischemia. EF was estimated at 30 to 44%. Overall this was an intermediate risk study.  He was admitted to the hospital in 09/2018 with increased shortness of breath felt to be secondary to acute on chronic systolic CHF and upper respiratory infection with admission complicated by acute on chronic renal failure in the setting of dietary noncompliance.  Echo during that admission showed an EF of 30 to 35%, diffuse hypokinesis, grade 1 diastolic dysfunction, mild mitral regurgitation, mildly dilated left atrium, normal RV systolic function, and a left pleural effusion measuring 8 cm.  He underwent nuclear stress test on 10/07/2018 that showed a large region of fixed inferior and inferior lateral wall perfusion defect consistent with previous MI with very mild ischemia in the inferolateral wall. Hypokinesis was noted of the inferior wall. EF was estimated at 36%. No EKG changes were concerning for ischemia at peak stress or in recovery. Overall this was a moderate risk scan with similar findings on study noted in 02/2018.  Patient underwent greater than 15 pound diuresis.  He was noted to have short runs of NSVT during this admission leading to titration of his Coreg.  Outpatient cardiac monitoring in 10/2018 showed NSR with an average heart rate of 81 bpm, 8 runs of NSVT with the fastest interval lasting 4 beats with a maximal rate of 200 bpm and the longest interval lasting 17 beats with an average rate of 120 bpm, 7 SVT runs occurred with the fastest interval lasting 5 beats with a maximal rate of 148 bpm and  the longest interval lasting 13 beats with an average rate of 110 bpm.  Isolated PACs, atrial couplets, and atrial triplets were rare, isolated PVCs and ventricular couplets were rare, ventricular trigeminy  was present.  In this setting, he was referred to EP for consideration of ICD given his left ventricular dysfunction with prior MI, bypass surgery, and NSVT.  He was evaluated by them in 01/2019 and declined ICD at that time with recommendation to repeat echo.  Subsequent repeat echo in 06/2019 (deferred in the setting of COVID-19 pandemic) showed a persistent cardiomyopathy with an EF of 30 to 35% with normal LV cavity size, diastolic dysfunction, diffuse left ventricular hypokinesis, normal RV systolic function, normal RV cavity size, moderately dilated left atrium, mild mitral regurgitation.  Patient was seen in the office on 07/06/2019 and doing well from a cardiac perspective.  He was eating a healthier diet and exercising with a noted intentional weight loss of 24 pounds at that time.  He had not needed as needed Lasix in approximately 6 months.  He continued to note labile hypertension with fluctuations in systolic readings ranging from the 80s to 160s.  He had recently been started on losartan by PCP with follow-up renal function being stable.  He was advised to take Coreg twice daily rather than once daily.  Given his underlying CKD follow-up labs were recommended though deferred by patient at that time.  He was referred to EP given his persistent cardiomyopathy for discussion of ICD.  Patient was subsequently admitted to the hospital in 07/10/2019 with watery diarrhea, dehydration, and acute on chronic CKD.  GI panel was negative.  Baseline serum creatinine approximately 2.4 with a presenting serum creatinine of 4.5 to be secondary to volume loss with watery diarrhea.  In this setting, the patient was IV hydrated with improvement in serum creatinine to 3.1 upon discharge.  He was advised to follow-up with nephrology as outpatient.  He was continued on aspirin, Lipitor, Coreg, hydralazine, Imdur, losartan, and his noncardiac medications.  ***   Labs: 07/2019 -Hgb 9.1, PLT 162 06/2019 -potassium 4.6,  serum creatinine 3.02, AST/LT normal, albumin 3.4 05/2019 - total cholesterol 107, triglyceride 80, HDL 28, LDL 63, TSH normal  Past Medical History:  Diagnosis Date  . CAD (coronary artery disease)    a. 04/2014 CABG x 4 (LIMA->LAD, VG->Diag, VG->OM, VG->PDA).  . Chronic systolic CHF (congestive heart failure) ([place])    a. 07/2014 Echo: EF 30-35%.  . CKD (chronic kidney disease), stage III ([place])    a. 12/2015 Creat 1.8.  . Diabetic foot ulcers ([place])    a. left foot 2nd digit ant 5 th digit 05/03/14; b. 08/2014 s/p amputation of toes on L foot.  . Hypercholesterolemia    a. 12/2015 TC 116, TG 154, HDL 24, LDL 61-->Atorvastatin 40.  Marland Kitchen Hypertension   . Hypertensive heart disease   . Ischemic cardiomyopathy    a. 07/2013 Echo: EF 20%; b. 07/2014 Echo: EF 30-35%, diff HK, Gr1 DD, mildly dil LA, nl PASP.  Marland Kitchen Myocardial infarction ([place])   . Neuropathy in diabetes ([place])   . Open wound    foot  . Orthostatic hypotension   . Peripheral vascular disease ([place])   . Type II diabetes mellitus ([place])    a. 12/2015 HbA1c = 13.9.    Past Surgical History:  Procedure Laterality Date  . ACHILLES TENDON SURGERY Right 01/22/2017   Procedure: ACHILLES LENGTHENING/KIDNER/TAL/Teno achilles lengthening;  Surgeon: Samara Deist, DPM;  Location: [facility] ORS;  Service:  Podiatry;  Laterality: Right;  . CARDIAC CATHETERIZATION     03/2014  . CATARACT EXTRACTION    . CORONARY ARTERY BYPASS GRAFT N/A 05/07/2014   Procedure: CORONARY ARTERY BYPASS GRAFTING (CABG);  Surgeon: Gaye Pollack, MD;  Location: [facility];  Service: Open Heart Surgery;  Laterality: N/A;  Times 4 using left internal mammary artery and endoscopically harvested right saphenous vein  . INTRAOPERATIVE TRANSESOPHAGEAL ECHOCARDIOGRAM N/A 05/07/2014   Procedure: INTRAOPERATIVE TRANSESOPHAGEAL ECHOCARDIOGRAM;  Surgeon: Gaye Pollack, MD;  Location[facility];  Service: Open Heart Surgery;  Laterality: N/A;  . left foot osteomyelitis and wound after surgery    . TOE  AMPUTATION     Left 3 and 4 toes  . TONSILECTOMY/ADENOIDECTOMY WITH MYRINGOTOMY      Current Medications: No outpatient medications have been marked as taking for the 07/28/19 encounter (Appointment) with Rise Mu, PA-C.    Allergies:   Other   Social History   Socioeconomic History  . Marital status: Married    Spouse name: Not on file  . Number of children: Not on file  . Years of education: Not on file  . Highest education level: Not on file  Occupational History  . Not on file  Social Needs  . Financial resource strain: Not on file  . Food insecurity    Worry: Not on file    Inability: Not on file  . Transportation needs    Medical: Not on file    Non-medical: Not on file  Tobacco Use  . Smoking status: Never Smoker  . Smokeless tobacco: Never Used  Substance and Sexual Activity  . Alcohol use: No  . Drug use: No  . Sexual activity: Not on file  Lifestyle  . Physical activity    Days per week: Not on file    Minutes per session: Not on file  . Stress: Not on file  Relationships  . Social Herbalist on phone: Not on file    Gets together: Not on file    Attends religious service: Not on file    Active member of club or organization: Not on file    Attends meetings of clubs or organizations: Not on file    Relationship status: Not on file  Other Topics Concern  . Not on file  Social History Narrative  . Not on file     Family History:  The patient's family history includes Diabetes type II in an other family member; Heart disease in his father; Hypertension in an other family member; Kidney disease in an other family member; Ovarian cancer in his mother.  ROS:   ROS   EKGs/Labs/Other Studies Reviewed:    Studies reviewed were summarized above. The additional studies were reviewed today:  2D Echo 06/30/2019: 1. The left ventricle has moderate-severely reduced systolic function, with an ejection fraction of 30-35%. The cavity size was  normal. There is mildly increased left ventricular wall thickness. Left ventricular diastolic Doppler parameters are  consistent with pseudonormalization. Left ventricular diffuse hypokinesis. 2. The right ventricle has normal systolic function. The cavity was normal. There is no increase in right ventricular wall thickness. Unable to estimate RVSP. 3. Left atrial size was moderately dilated. __________  Elwyn Reach 10/2018: Normal sinus rhythm avg HR of 81 bpm.  8 Ventricular Tachycardia runs occurred, the run with the fastest interval lasting 4 beats with a max rate of 200 bpm,  the longest lasting 17 beats with an avg rate of  120 bpm.   7 Supraventricular Tachycardia runs occurred,  the run with the fastest interval lasting 5 beats with a max rate of 148 bpm,  the longest lasting 13 beats with an avg rate of 110 bpm.   Isolated SVEs were rare (<1.0%), SVE Couplets were rare (<1.0%), and SVE Triplets were rare (<1.0%).  Isolated VEs were rare (<1.0%), VE Couplets were rare (<1.0%), and no VE Triplets were present. Ventricular Trigeminy was present. __________  Myoview 09/2018: Pharmacological myocardial perfusion imaging study with large region of fixed inferior and inferolateral wall perfusion defect consistent with previous MI Very mild ischemia in the inferolateral wall (peri-infarct ischemia) Hypokinesis noted in the inferior wall. EF estimated at 36% No EKG changes concerning for ischemia at peak stress or in recovery. Moderate risk scan Similar findings on the study noted in 02/2018 and several years ago.   EKG:  EKG is ordered today.  The EKG ordered today demonstrates ***  Recent Labs: 10/19/2018: B Natriuretic Peptide 716.0 07/10/2019: ALT 26 07/13/2019: BUN 53; Creatinine, Ser 3.02; Potassium 4.6; Sodium 142 07/19/2019: Hemoglobin 9.1; Platelets 162  Recent Lipid Panel No results found for: CHOL, TRIG, HDL, CHOLHDL, VLDL, LDLCALC, LDLDIRECT  PHYSICAL EXAM:    VS:   There were no vitals taken for this visit.  BMI: There is no height or weight on file to calculate BMI.  Physical Exam  Wt Readings from Last 3 Encounters:  07/10/19 259 lb (117.5 kg)  07/06/19 242 lb 12 oz (110.1 kg)  01/19/19 266 lb 8 oz (120.9 kg)     ASSESSMENT & PLAN:   1. ***  Disposition: F/u with Dr. Rockey Situ or an APP in *** and EP as directed.   Medication Adjustments/Labs and Tests Ordered: Current medicines are reviewed at length with the patient today.  Concerns regarding medicines are outlined above. Medication changes, Labs and Tests ordered today are summarized above and listed in the Patient Instructions accessible in Encounters.   Signed, Christell Faith, PA-C 07/24/2019 10:34 AM     [place] [address] [place] [address] [place], [place] [address] [phone]

## 2019-07-27 ENCOUNTER — Emergency Department: Payer: Medicare HMO

## 2019-07-27 ENCOUNTER — Encounter: Payer: Self-pay | Admitting: Emergency Medicine

## 2019-07-27 ENCOUNTER — Inpatient Hospital Stay
Admission: EM | Admit: 2019-07-27 | Discharge: 2019-08-08 | DRG: 291 | Disposition: A | Discharge status: Home Health | Payer: Medicare HMO | Attending: Internal Medicine | Admitting: Internal Medicine

## 2019-07-27 ENCOUNTER — Other Ambulatory Visit: Payer: Self-pay

## 2019-07-27 DIAGNOSIS — X500XXA Overexertion from strenuous movement or load, initial encounter: Secondary | ICD-10-CM

## 2019-07-27 DIAGNOSIS — E872 Acidosis: Secondary | ICD-10-CM | POA: Diagnosis present

## 2019-07-27 DIAGNOSIS — N184 Chronic kidney disease, stage 4 (severe): Secondary | ICD-10-CM | POA: Diagnosis not present

## 2019-07-27 DIAGNOSIS — M5126 Other intervertebral disc displacement, lumbar region: Secondary | ICD-10-CM | POA: Diagnosis not present

## 2019-07-27 DIAGNOSIS — R222 Localized swelling, mass and lump, trunk: Secondary | ICD-10-CM | POA: Diagnosis not present

## 2019-07-27 DIAGNOSIS — I472 Ventricular tachycardia: Secondary | ICD-10-CM | POA: Diagnosis present

## 2019-07-27 DIAGNOSIS — M545 Low back pain, unspecified: Secondary | ICD-10-CM

## 2019-07-27 DIAGNOSIS — S300XXA Contusion of lower back and pelvis, initial encounter: Secondary | ICD-10-CM | POA: Diagnosis present

## 2019-07-27 DIAGNOSIS — E1151 Type 2 diabetes mellitus with diabetic peripheral angiopathy without gangrene: Secondary | ICD-10-CM | POA: Diagnosis not present

## 2019-07-27 DIAGNOSIS — E785 Hyperlipidemia, unspecified: Secondary | ICD-10-CM | POA: Diagnosis present

## 2019-07-27 DIAGNOSIS — Z951 Presence of aortocoronary bypass graft: Secondary | ICD-10-CM | POA: Diagnosis not present

## 2019-07-27 DIAGNOSIS — E1122 Type 2 diabetes mellitus with diabetic chronic kidney disease: Secondary | ICD-10-CM | POA: Diagnosis not present

## 2019-07-27 DIAGNOSIS — L03312 Cellulitis of back [any part except buttock]: Secondary | ICD-10-CM | POA: Diagnosis present

## 2019-07-27 DIAGNOSIS — I248 Other forms of acute ischemic heart disease: Secondary | ICD-10-CM | POA: Diagnosis present

## 2019-07-27 DIAGNOSIS — T148XXA Other injury of unspecified body region, initial encounter: Secondary | ICD-10-CM | POA: Diagnosis not present

## 2019-07-27 DIAGNOSIS — I5043 Acute on chronic combined systolic (congestive) and diastolic (congestive) heart failure: Secondary | ICD-10-CM

## 2019-07-27 DIAGNOSIS — I509 Heart failure, unspecified: Secondary | ICD-10-CM | POA: Diagnosis not present

## 2019-07-27 DIAGNOSIS — Z992 Dependence on renal dialysis: Secondary | ICD-10-CM

## 2019-07-27 DIAGNOSIS — D631 Anemia in chronic kidney disease: Secondary | ICD-10-CM | POA: Diagnosis present

## 2019-07-27 DIAGNOSIS — J449 Chronic obstructive pulmonary disease, unspecified: Secondary | ICD-10-CM | POA: Diagnosis present

## 2019-07-27 DIAGNOSIS — Z881 Allergy status to other antibiotic agents status: Secondary | ICD-10-CM

## 2019-07-27 DIAGNOSIS — I471 Supraventricular tachycardia: Secondary | ICD-10-CM | POA: Diagnosis present

## 2019-07-27 DIAGNOSIS — R600 Localized edema: Secondary | ICD-10-CM | POA: Diagnosis not present

## 2019-07-27 DIAGNOSIS — M7981 Nontraumatic hematoma of soft tissue: Secondary | ICD-10-CM | POA: Diagnosis not present

## 2019-07-27 DIAGNOSIS — I429 Cardiomyopathy, unspecified: Secondary | ICD-10-CM | POA: Diagnosis not present

## 2019-07-27 DIAGNOSIS — I442 Atrioventricular block, complete: Secondary | ICD-10-CM | POA: Diagnosis not present

## 2019-07-27 DIAGNOSIS — I25118 Atherosclerotic heart disease of native coronary artery with other forms of angina pectoris: Secondary | ICD-10-CM | POA: Diagnosis not present

## 2019-07-27 DIAGNOSIS — L0390 Cellulitis, unspecified: Secondary | ICD-10-CM | POA: Diagnosis not present

## 2019-07-27 DIAGNOSIS — R0602 Shortness of breath: Secondary | ICD-10-CM | POA: Diagnosis not present

## 2019-07-27 DIAGNOSIS — Z833 Family history of diabetes mellitus: Secondary | ICD-10-CM

## 2019-07-27 DIAGNOSIS — N2581 Secondary hyperparathyroidism of renal origin: Secondary | ICD-10-CM | POA: Diagnosis not present

## 2019-07-27 DIAGNOSIS — R609 Edema, unspecified: Secondary | ICD-10-CM

## 2019-07-27 DIAGNOSIS — Z794 Long term (current) use of insulin: Secondary | ICD-10-CM

## 2019-07-27 DIAGNOSIS — E8809 Other disorders of plasma-protein metabolism, not elsewhere classified: Secondary | ICD-10-CM | POA: Diagnosis present

## 2019-07-27 DIAGNOSIS — R531 Weakness: Secondary | ICD-10-CM | POA: Diagnosis present

## 2019-07-27 DIAGNOSIS — Z8619 Personal history of other infectious and parasitic diseases: Secondary | ICD-10-CM

## 2019-07-27 DIAGNOSIS — I5042 Chronic combined systolic (congestive) and diastolic (congestive) heart failure: Secondary | ICD-10-CM | POA: Diagnosis not present

## 2019-07-27 DIAGNOSIS — I5023 Acute on chronic systolic (congestive) heart failure: Secondary | ICD-10-CM | POA: Diagnosis present

## 2019-07-27 DIAGNOSIS — Z7401 Bed confinement status: Secondary | ICD-10-CM

## 2019-07-27 DIAGNOSIS — I132 Hypertensive heart and chronic kidney disease with heart failure and with stage 5 chronic kidney disease, or end stage renal disease: Principal | ICD-10-CM | POA: Diagnosis present

## 2019-07-27 DIAGNOSIS — W06XXXA Fall from bed, initial encounter: Secondary | ICD-10-CM | POA: Diagnosis present

## 2019-07-27 DIAGNOSIS — M549 Dorsalgia, unspecified: Secondary | ICD-10-CM

## 2019-07-27 DIAGNOSIS — K6282 Dysplasia of anus: Secondary | ICD-10-CM | POA: Diagnosis not present

## 2019-07-27 DIAGNOSIS — Z20828 Contact with and (suspected) exposure to other viral communicable diseases: Secondary | ICD-10-CM | POA: Diagnosis present

## 2019-07-27 DIAGNOSIS — Z79899 Other long term (current) drug therapy: Secondary | ICD-10-CM

## 2019-07-27 DIAGNOSIS — I255 Ischemic cardiomyopathy: Secondary | ICD-10-CM | POA: Diagnosis present

## 2019-07-27 DIAGNOSIS — Z6837 Body mass index (BMI) 37.0-37.9, adult: Secondary | ICD-10-CM

## 2019-07-27 DIAGNOSIS — I130 Hypertensive heart and chronic kidney disease with heart failure and stage 1 through stage 4 chronic kidney disease, or unspecified chronic kidney disease: Secondary | ICD-10-CM | POA: Diagnosis not present

## 2019-07-27 DIAGNOSIS — Y92003 Bedroom of unspecified non-institutional (private) residence as the place of occurrence of the external cause: Secondary | ICD-10-CM | POA: Diagnosis not present

## 2019-07-27 DIAGNOSIS — N170 Acute kidney failure with tubular necrosis: Secondary | ICD-10-CM | POA: Diagnosis not present

## 2019-07-27 DIAGNOSIS — E1143 Type 2 diabetes mellitus with diabetic autonomic (poly)neuropathy: Secondary | ICD-10-CM | POA: Diagnosis present

## 2019-07-27 DIAGNOSIS — T501X6A Underdosing of loop [high-ceiling] diuretics, initial encounter: Secondary | ICD-10-CM | POA: Diagnosis present

## 2019-07-27 DIAGNOSIS — I517 Cardiomegaly: Secondary | ICD-10-CM | POA: Diagnosis not present

## 2019-07-27 DIAGNOSIS — Z89422 Acquired absence of other left toe(s): Secondary | ICD-10-CM | POA: Diagnosis not present

## 2019-07-27 DIAGNOSIS — E669 Obesity, unspecified: Secondary | ICD-10-CM | POA: Diagnosis present

## 2019-07-27 DIAGNOSIS — D62 Acute posthemorrhagic anemia: Secondary | ICD-10-CM | POA: Diagnosis present

## 2019-07-27 DIAGNOSIS — G9349 Other encephalopathy: Secondary | ICD-10-CM | POA: Diagnosis not present

## 2019-07-27 DIAGNOSIS — N186 End stage renal disease: Secondary | ICD-10-CM | POA: Diagnosis present

## 2019-07-27 DIAGNOSIS — I129 Hypertensive chronic kidney disease with stage 1 through stage 4 chronic kidney disease, or unspecified chronic kidney disease: Secondary | ICD-10-CM | POA: Diagnosis not present

## 2019-07-27 DIAGNOSIS — Z91120 Patient's intentional underdosing of medication regimen due to financial hardship: Secondary | ICD-10-CM

## 2019-07-27 DIAGNOSIS — D649 Anemia, unspecified: Secondary | ICD-10-CM | POA: Diagnosis not present

## 2019-07-27 DIAGNOSIS — I2510 Atherosclerotic heart disease of native coronary artery without angina pectoris: Secondary | ICD-10-CM | POA: Diagnosis not present

## 2019-07-27 DIAGNOSIS — L84 Corns and callosities: Secondary | ICD-10-CM | POA: Diagnosis not present

## 2019-07-27 DIAGNOSIS — E7800 Pure hypercholesterolemia, unspecified: Secondary | ICD-10-CM | POA: Diagnosis present

## 2019-07-27 DIAGNOSIS — I5022 Chronic systolic (congestive) heart failure: Secondary | ICD-10-CM | POA: Diagnosis not present

## 2019-07-27 DIAGNOSIS — R890 Abnormal level of enzymes in specimens from other organs, systems and tissues: Secondary | ICD-10-CM | POA: Diagnosis not present

## 2019-07-27 DIAGNOSIS — M4620 Osteomyelitis of vertebra, site unspecified: Secondary | ICD-10-CM | POA: Diagnosis not present

## 2019-07-27 DIAGNOSIS — J9601 Acute respiratory failure with hypoxia: Secondary | ICD-10-CM | POA: Diagnosis not present

## 2019-07-27 DIAGNOSIS — I420 Dilated cardiomyopathy: Secondary | ICD-10-CM | POA: Diagnosis not present

## 2019-07-27 DIAGNOSIS — N179 Acute kidney failure, unspecified: Secondary | ICD-10-CM | POA: Diagnosis not present

## 2019-07-27 DIAGNOSIS — E875 Hyperkalemia: Secondary | ICD-10-CM | POA: Diagnosis present

## 2019-07-27 DIAGNOSIS — J9621 Acute and chronic respiratory failure with hypoxia: Secondary | ICD-10-CM | POA: Diagnosis present

## 2019-07-27 DIAGNOSIS — Z7984 Long term (current) use of oral hypoglycemic drugs: Secondary | ICD-10-CM

## 2019-07-27 DIAGNOSIS — L089 Local infection of the skin and subcutaneous tissue, unspecified: Secondary | ICD-10-CM | POA: Diagnosis not present

## 2019-07-27 DIAGNOSIS — Z7982 Long term (current) use of aspirin: Secondary | ICD-10-CM

## 2019-07-27 DIAGNOSIS — R7989 Other specified abnormal findings of blood chemistry: Secondary | ICD-10-CM | POA: Diagnosis not present

## 2019-07-27 DIAGNOSIS — N189 Chronic kidney disease, unspecified: Secondary | ICD-10-CM | POA: Diagnosis not present

## 2019-07-27 DIAGNOSIS — I252 Old myocardial infarction: Secondary | ICD-10-CM

## 2019-07-27 DIAGNOSIS — Z9111 Patient's noncompliance with dietary regimen: Secondary | ICD-10-CM

## 2019-07-27 DIAGNOSIS — Z8249 Family history of ischemic heart disease and other diseases of the circulatory system: Secondary | ICD-10-CM

## 2019-07-27 LAB — CBC
HCT: 25 % — ABNORMAL LOW (ref 39.0–52.0)
Hemoglobin: 8 g/dL — ABNORMAL LOW (ref 13.0–17.0)
MCH: 29.5 pg (ref 26.0–34.0)
MCHC: 32 g/dL (ref 30.0–36.0)
MCV: 92.3 fL (ref 80.0–100.0)
Platelets: 309 10*3/uL (ref 150–400)
RBC: 2.71 MIL/uL — ABNORMAL LOW (ref 4.22–5.81)
RDW: 13.1 % (ref 11.5–15.5)
WBC: 14.5 10*3/uL — ABNORMAL HIGH (ref 4.0–10.5)
nRBC: 0 % (ref 0.0–0.2)

## 2019-07-27 LAB — BASIC METABOLIC PANEL
Anion gap: 8 (ref 5–15)
BUN: 69 mg/dL — ABNORMAL HIGH (ref 6–20)
CO2: 20 mmol/L — ABNORMAL LOW (ref 22–32)
Calcium: 8.3 mg/dL — ABNORMAL LOW (ref 8.9–10.3)
Chloride: 106 mmol/L (ref 98–111)
Creatinine, Ser: 3.55 mg/dL — ABNORMAL HIGH (ref 0.61–1.24)
GFR calc Af Amer: 21 mL/min — ABNORMAL LOW (ref >60)
GFR calc non Af Amer: 18 mL/min — ABNORMAL LOW (ref >60)
Glucose, Bld: 344 mg/dL — ABNORMAL HIGH (ref 70–99)
Potassium: 4.7 mmol/L (ref 3.5–5.1)
Sodium: 134 mmol/L — ABNORMAL LOW (ref 135–145)

## 2019-07-27 LAB — TROPONIN I (HIGH SENSITIVITY)
Troponin I (High Sensitivity): 32 ng/L — ABNORMAL HIGH (ref <18)
Troponin I (High Sensitivity): 55 ng/L — ABNORMAL HIGH (ref <18)

## 2019-07-27 LAB — SARS CORONAVIRUS 2 BY RT PCR (HOSPITAL ORDER, PERFORMED IN ~~LOC~~ HOSPITAL LAB): SARS Coronavirus 2: NEGATIVE

## 2019-07-27 MED ORDER — SODIUM CHLORIDE 0.9 % IV SOLN
2.0000 g | Freq: Once | INTRAVENOUS | Status: AC
  Administered 2019-07-28: 2 g via INTRAVENOUS
  Filled 2019-07-27: qty 20

## 2019-07-27 MED ORDER — ASPIRIN 81 MG PO CHEW
324.0000 mg | CHEWABLE_TABLET | Freq: Once | ORAL | Status: AC
  Administered 2019-07-27: 324 mg via ORAL
  Filled 2019-07-27: qty 4

## 2019-07-27 MED ORDER — FUROSEMIDE 10 MG/ML IJ SOLN
40.0000 mg | Freq: Once | INTRAMUSCULAR | Status: AC
  Administered 2019-07-27: 40 mg via INTRAVENOUS
  Filled 2019-07-27: qty 4

## 2019-07-27 MED ORDER — VANCOMYCIN HCL 10 G IV SOLR
2000.0000 mg | Freq: Once | INTRAVENOUS | Status: AC
  Administered 2019-07-28: 2000 mg via INTRAVENOUS
  Filled 2019-07-27: qty 2000

## 2019-07-27 MED ORDER — HYDROMORPHONE HCL 1 MG/ML IJ SOLN
1.0000 mg | Freq: Once | INTRAMUSCULAR | Status: AC
  Administered 2019-07-27: 1 mg via INTRAVENOUS
  Filled 2019-07-27: qty 1

## 2019-07-27 NOTE — ED Notes (Signed)
Charge nurse notified of troponin, bed requested for further eval

## 2019-07-27 NOTE — ED Provider Notes (Addendum)
[facility] Emergency Department Provider Note  ____________________________________________  Time seen: Approximately 11:05 PM  I have reviewed the triage vital signs and the nursing notes.   HISTORY  Chief Complaint Shortness of Breath and Back Pain    HPI [name] [name] is a 58 y.o. male with a history of CAD, CHF, CKD, diabetes hypertension who comes the ED complaining of shortness of breath for the past week, associated with pronounced peripheral edema.  Denies chest pain.  Symptoms are constant.  No alleviating factors.   Is also been bedbound due to midline low back pain that is nonradiating severe worse with movement no alleviating factors after try to lift a case of water at the grocery.   He has Lasix that he can take when his heart failure is worse.  He normally avoids it due to his CKD, but has taken it the last few days without relief.  Past Medical History:  Diagnosis Date  . CAD (coronary artery disease)    a. 04/2014 CABG x 4 (LIMA->LAD, VG->Diag, VG->OM, VG->PDA).  . Chronic systolic CHF (congestive heart failure) ([place])    a. 07/2014 Echo: EF 30-35%.  . CKD (chronic kidney disease), stage III ([place])    a. 12/2015 Creat 1.8.  . Diabetic foot ulcers ([place])    a. left foot 2nd digit ant 5 th digit 05/03/14; b. 08/2014 s/p amputation of toes on L foot.  . Hypercholesterolemia    a. 12/2015 TC 116, TG 154, HDL 24, LDL 61-->Atorvastatin 40.  Marland Kitchen Hypertension   . Hypertensive heart disease   . Ischemic cardiomyopathy    a. 07/2013 Echo: EF 20%; b. 07/2014 Echo: EF 30-35%, diff HK, Gr1 DD, mildly dil LA, nl PASP.  Marland Kitchen Myocardial infarction ([place])   . Neuropathy in diabetes ([place])   . Open wound    foot  . Orthostatic hypotension   . Peripheral vascular disease ([place])   . Type II diabetes mellitus ([place])    a. 12/2015 HbA1c = 13.9.     Patient Active Problem List   Diagnosis Date Noted  . Acute renal failure (ARF) ([place]) 07/12/2019  . Acute renal  failure superimposed on stage 4 chronic kidney disease ([place]) 07/10/2019  . NSVT (nonsustained ventricular tachycardia) ([place]) 11/22/2018  . Chronic systolic heart failure ([place]) 10/12/2018  . HTN (hypertension) 10/12/2018  . Preop cardiovascular exam 12/10/2016  . Hypertensive heart disease   . Ischemic cardiomyopathy   . Hypercholesterolemia   . CKD (chronic kidney disease), stage III ([place])   . Hyperlipidemia 07/31/2014  . Orthostatic hypotension 06/15/2014  . Chronic kidney disease 05/21/2014  . Chronic osteomyelitis of toe of left foot ([place]) 05/21/2014  . Diabetic ulcer of left foot ([place]) 05/21/2014  . Physical deconditioning 05/15/2014  . S/P CABG x 4 05/13/2014  . Diabetes mellitus type 2 with complications ([place]) 16/08/9603  . CAD (coronary artery disease)      Past Surgical History:  Procedure Laterality Date  . ACHILLES TENDON SURGERY Right 01/22/2017   Procedure: ACHILLES LENGTHENING/KIDNER/TAL/Teno achilles lengthening;  Surgeon: Samara Deist, DPM;  Location: [facility] ORS;  Service: Podiatry;  Laterality: Right;  . CARDIAC CATHETERIZATION     03/2014  . CATARACT EXTRACTION    . CORONARY ARTERY BYPASS GRAFT N/A 05/07/2014   Procedure: CORONARY ARTERY BYPASS GRAFTING (CABG);  Surgeon: Gaye Pollack, MD;  Location: [facility];  Service: Open Heart Surgery;  Laterality: N/A;  Times 4 using left internal mammary artery and endoscopically harvested right saphenous vein  .  INTRAOPERATIVE TRANSESOPHAGEAL ECHOCARDIOGRAM N/A 05/07/2014   Procedure: INTRAOPERATIVE TRANSESOPHAGEAL ECHOCARDIOGRAM;  Surgeon: Gaye Pollack, MD;  Location: [facility] OR;  Service: Open Heart Surgery;  Laterality: N/A;  . left foot osteomyelitis and wound after surgery    . TOE AMPUTATION     Left 3 and 4 toes  . TONSILECTOMY/ADENOIDECTOMY WITH MYRINGOTOMY       Prior to Admission medications   Medication Sig Start Date End Date Taking? Authorizing Provider  aspirin EC 81 MG tablet Take 81 mg by mouth daily.    [provider]  atorvastatin (LIPITOR) 40 MG tablet Take 1 tablet (40 mg total) by mouth at bedtime. 11/23/18   Minna Merritts, MD  carvedilol (COREG) 25 MG tablet Take 1 tablet (25 mg total) by mouth 2 (two) times daily with a meal. 11/23/18   Gollan, Kathlene November, MD  Cyanocobalamin (VITAMIN B 12 PO) Take by mouth daily.    [provider]  famotidine (PEPCID) 20 MG tablet TAKE 1 TABLET BY MOUTH EVERY DAY 07/18/19   Vonda Antigua B, MD  ferrous sulfate 325 (65 FE) MG tablet Take 325 mg by mouth daily.    [provider]  glimepiride (AMARYL) 4 MG tablet Take 4 mg by mouth 2 (two) times daily with a meal.  11/20/16   [provider]  hydrALAZINE (APRESOLINE) 25 MG tablet Take 1 tablet (25 mg total) by mouth 2 (two) times daily. 07/06/19   Rise Mu, PA-C  HYDROcodone-acetaminophen (NORCO/VICODIN) 5-325 MG tablet Take by mouth as needed. 05/30/19   [provider]  isosorbide mononitrate (IMDUR) 60 MG 24 hr tablet Take 0.5 tablets (30 mg total) by mouth daily. 07/06/19   Rise Mu, PA-C  loperamide (IMODIUM) 2 MG capsule Take 1 capsule (2 mg total) by mouth every 4 (four) hours as needed for diarrhea or loose stools. 07/13/19   Henreitta Leber, MD  losartan (COZAAR) 25 MG tablet Take 25 mg by mouth daily.  05/30/19 05/29/20  [provider]  nitroGLYCERIN (NITROSTAT) 0.4 MG SL tablet Place 1 tablet (0.4 mg total) under the tongue every 5 (five) minutes as needed for chest pain. 12/24/15   Theora Gianotti, NP  polyethylene glycol (MIRALAX) 17 g packet Take 17 g by mouth daily. 07/18/19   Virgel Manifold, MD  sodium bicarbonate 650 MG tablet Take 1,300 mg by mouth 2 (two) times daily.  03/01/19   [provider]     Allergies Other   Family History  Problem Relation Age of Onset  . Heart disease Father        CABG in his 6's  . Diabetes type II Other   . Kidney disease Other   . Hypertension Other   . Ovarian cancer Mother      Social History Social History   Tobacco Use  . Smoking status: Never Smoker  . Smokeless tobacco: Never Used  Substance Use Topics  . Alcohol use: No  . Drug use: No    Review of Systems  Constitutional:   No fever or chills.  ENT:   No sore throat. No rhinorrhea. Cardiovascular:   No chest pain or syncope. Respiratory: Positive shortness of breath without cough. Gastrointestinal:   Negative for abdominal pain, vomiting and diarrhea.  Musculoskeletal:   Positive low back pain. All other systems reviewed and are negative except as documented above in ROS and HPI.  ____________________________________________   PHYSICAL EXAM:  VITAL SIGNS: ED Triage Vitals  Enc Vitals Group     BP 07/27/19 1706 (!) 168/80     Pulse Rate 07/27/19 1706 80     Resp 07/27/19 1706 16     Temp 07/27/19 1706 98.8 F (37.1 C)     Temp Source 07/27/19 1706 Oral     SpO2 07/27/19 1706 95 %     Weight 07/27/19 1707 275 lb (124.7 kg)     Height 07/27/19 1707 6\' 1"  (1.854 m)     Head Circumference --      Peak Flow --      Pain Score 07/27/19 1707 7     Pain Loc --      Pain Edu? --      Excl. in [place]? --     Vital signs reviewed, nursing assessments reviewed.   Constitutional:   Alert and oriented. Non-toxic appearance. Eyes:   Conjunctivae are normal. EOMI. PERRL. ENT      Head:   Normocephalic and atraumatic.      Nose:   No congestion/rhinnorhea.       Mouth/Throat:   MMM, no pharyngeal erythema. No peritonsillar mass.       Neck:   No meningismus. Full ROM. Hematological/Lymphatic/Immunilogical:   No cervical lymphadenopathy. Cardiovascular:   RRR. Symmetric bilateral radial and DP pulses.  No murmurs. Cap refill less than 2 seconds. Respiratory:   Normal respiratory effort without tachypnea/retractions. Breath sounds are clear and equal bilaterally. No wheezes/rales/rhonchi. Gastrointestinal:   Soft and nontender. Non distended. There is no CVA tenderness.  No rebound, rigidity,  or guarding.  Musculoskeletal:   Normal range of motion in all extremities. No joint effusions.  No lower extremity tenderness.  3+ pitting edema bilateral lower extremities There is midline spinal tenderness in the area of L4.  No crepitus or step-off, no inflammatory skin changes. Neurologic:   Normal speech and language.  Motor grossly intact. No acute focal neurologic deficits are appreciated.  Skin:    Skin is warm, dry and intact. No rash noted.  No petechiae, purpura, or bullae.  ____________________________________________    LABS (pertinent positives/negatives) (all labs ordered are listed, but only abnormal results are displayed) Labs Reviewed  BASIC METABOLIC PANEL - Abnormal; Notable for the following components:      Result Value   Sodium 134 (*)    CO2 20 (*)    Glucose, Bld 344 (*)    BUN 69 (*)    Creatinine, Ser 3.55 (*)    Calcium 8.3 (*)    GFR calc non Af Amer 18 (*)    GFR calc Af Amer 21 (*)    All other components within normal limits  CBC - Abnormal; Notable for the following components:   WBC 14.5 (*)    RBC 2.71 (*)    Hemoglobin 8.0 (*)    HCT 25.0 (*)    All other components within normal limits  TROPONIN I (HIGH SENSITIVITY) - Abnormal; Notable for the following components:   Troponin I (High Sensitivity) 32 (*)    All other components within normal limits  TROPONIN I (HIGH SENSITIVITY) - Abnormal; Notable for the following components:   Troponin I (High Sensitivity) 55 (*)    All other components within normal limits  SARS CORONAVIRUS 2 (HOSPITAL ORDER, [facility])   ____________________________________________   EKG  Interpreted by me Normal sinus rhythm rate of 78, normal axis and intervals.  Poor R wave progression.  Normal ST segments and T waves.  No acute ischemic changes, not consistent with STEMI.  ____________________________________________    RADIOLOGY  Dg Chest 2 View  Result Date:  07/27/2019 CLINICAL DATA:  Shortness of breath EXAM: CHEST - 2 VIEW COMPARISON:  10/19/2018 FINDINGS: Cardiomegaly status post median sternotomy. Pulmonary vascular prominence without overt edema or other airspace opacity. The visualized skeletal structures are unremarkable. IMPRESSION: Cardiomegaly. Pulmonary vascular prominence without overt edema or other airspace opacity. Electronically Signed   By: Eddie Candle M.D.   On: 07/27/2019 17:48    ____________________________________________   PROCEDURES Procedures  ____________________________________________  DIFFERENTIAL DIAGNOSIS   Non-STEMI, CHF exacerbation, pulmonary edema, pleural effusion, pneumonia  CLINICAL IMPRESSION / ASSESSMENT AND PLAN / ED COURSE  Medications ordered in the ED: Medications  HYDROmorphone (DILAUDID) injection 1 mg (1 mg Intravenous Given 07/27/19 2230)  furosemide (LASIX) injection 40 mg (40 mg Intravenous Given 07/27/19 2230)  aspirin chewable tablet 324 mg (324 mg Oral Given 07/27/19 2235)    Pertinent labs & imaging results that were available during my care of the patient were reviewed by me and considered in my medical decision making (see chart for details).  [name] [name] was evaluated in Emergency Department on 07/27/2019 for the symptoms described in the history of present illness. He was evaluated in the context of the global COVID-19 pandemic, which necessitated consideration that the patient might be at risk for infection with the SARS-CoV-2 virus that causes COVID-19. Institutional protocols and algorithms that pertain to the evaluation of patients at risk for COVID-19 are in a state of rapid change based on information released by regulatory bodies including the CDC and federal and state organizations. These policies and algorithms were followed during the patient's care in the ED.   Patient presents with shortness of breath peripheral edema.  His hemoglobin is slightly lower than baseline, his  chest x-ray looks congested, all consistent with volume overload and CHF exacerbation.  Troponins are also uptrending from 30-55.  I will give a full dose aspirin, Lasix, plan to hospitalize for diuresis for symptomatic CHF exacerbation and further cardiac work-up.  He does have midline tenderness in the low back, I think this is due to a herniated disc.  Due to his reported recent hospitalization from a abdominal infection, I will obtain a CT scan of the lumbar spine to evaluate for signs of discitis.  Dilaudid 1 mg IV for now for pain control.  Clinical Course as of Jul 26 2341  Thu Jul 27, 2019  2328 CT discussed with radiologist who finds a lot of soft tissue edema in the area of L4 concerning for strain and hematoma versus early infection.   [PS]    Clinical Course User Index [PS] Carrie Mew, MD    ----------------------------------------- 11:43 PM on 07/27/2019 -----------------------------------------  Plan to start antibiotics for coverage of possible spinal infection.  Due to CKD, will need MRI to further characterize inflammatory finding.   ____________________________________________   FINAL CLINICAL IMPRESSION(S) / ED DIAGNOSES    Final diagnoses:  Acute on chronic congestive heart failure, unspecified heart failure type ([place])  Peripheral edema  Stage 4 chronic kidney disease (H[facility])  Acute midline low back pain without sciatica     ED Discharge Orders    None      Portions of this note were generated with dragon dictation software. Dictation errors may occur despite best attempts at proofreading.   Carrie Mew, MD 07/27/19 2310    Carrie Mew, MD 07/27/19 [phone]

## 2019-07-27 NOTE — ED Notes (Signed)
Report given to Butch, RN. 

## 2019-07-27 NOTE — ED Triage Notes (Signed)
Pt in via POV, reports ongoing shortness of breath and mid-lower back pain x approximately 2 weeks.  Vitals WDL, NAD noted at this time.

## 2019-07-28 ENCOUNTER — Other Ambulatory Visit: Payer: Self-pay

## 2019-07-28 ENCOUNTER — Ambulatory Visit: Payer: Medicare HMO | Admitting: Physician Assistant

## 2019-07-28 ENCOUNTER — Encounter: Payer: Self-pay | Admitting: *Deleted

## 2019-07-28 ENCOUNTER — Inpatient Hospital Stay: Payer: Medicare HMO

## 2019-07-28 DIAGNOSIS — M4620 Osteomyelitis of vertebra, site unspecified: Secondary | ICD-10-CM | POA: Diagnosis not present

## 2019-07-28 DIAGNOSIS — I509 Heart failure, unspecified: Secondary | ICD-10-CM | POA: Diagnosis not present

## 2019-07-28 DIAGNOSIS — D631 Anemia in chronic kidney disease: Secondary | ICD-10-CM | POA: Diagnosis present

## 2019-07-28 DIAGNOSIS — E7800 Pure hypercholesterolemia, unspecified: Secondary | ICD-10-CM | POA: Diagnosis present

## 2019-07-28 DIAGNOSIS — I5043 Acute on chronic combined systolic (congestive) and diastolic (congestive) heart failure: Secondary | ICD-10-CM | POA: Diagnosis not present

## 2019-07-28 DIAGNOSIS — R7989 Other specified abnormal findings of blood chemistry: Secondary | ICD-10-CM | POA: Diagnosis not present

## 2019-07-28 DIAGNOSIS — I248 Other forms of acute ischemic heart disease: Secondary | ICD-10-CM | POA: Diagnosis present

## 2019-07-28 DIAGNOSIS — I5022 Chronic systolic (congestive) heart failure: Secondary | ICD-10-CM | POA: Diagnosis not present

## 2019-07-28 DIAGNOSIS — L03312 Cellulitis of back [any part except buttock]: Secondary | ICD-10-CM | POA: Diagnosis present

## 2019-07-28 DIAGNOSIS — I132 Hypertensive heart and chronic kidney disease with heart failure and with stage 5 chronic kidney disease, or end stage renal disease: Secondary | ICD-10-CM | POA: Diagnosis present

## 2019-07-28 DIAGNOSIS — I129 Hypertensive chronic kidney disease with stage 1 through stage 4 chronic kidney disease, or unspecified chronic kidney disease: Secondary | ICD-10-CM | POA: Diagnosis not present

## 2019-07-28 DIAGNOSIS — I442 Atrioventricular block, complete: Secondary | ICD-10-CM | POA: Diagnosis not present

## 2019-07-28 DIAGNOSIS — I471 Supraventricular tachycardia: Secondary | ICD-10-CM | POA: Diagnosis present

## 2019-07-28 DIAGNOSIS — I472 Ventricular tachycardia: Secondary | ICD-10-CM | POA: Diagnosis present

## 2019-07-28 DIAGNOSIS — J449 Chronic obstructive pulmonary disease, unspecified: Secondary | ICD-10-CM | POA: Diagnosis present

## 2019-07-28 DIAGNOSIS — N184 Chronic kidney disease, stage 4 (severe): Secondary | ICD-10-CM | POA: Diagnosis not present

## 2019-07-28 DIAGNOSIS — Z992 Dependence on renal dialysis: Secondary | ICD-10-CM | POA: Diagnosis not present

## 2019-07-28 DIAGNOSIS — I25118 Atherosclerotic heart disease of native coronary artery with other forms of angina pectoris: Secondary | ICD-10-CM | POA: Diagnosis not present

## 2019-07-28 DIAGNOSIS — X500XXA Overexertion from strenuous movement or load, initial encounter: Secondary | ICD-10-CM | POA: Diagnosis not present

## 2019-07-28 DIAGNOSIS — E785 Hyperlipidemia, unspecified: Secondary | ICD-10-CM | POA: Diagnosis present

## 2019-07-28 DIAGNOSIS — J9621 Acute and chronic respiratory failure with hypoxia: Secondary | ICD-10-CM | POA: Diagnosis present

## 2019-07-28 DIAGNOSIS — R609 Edema, unspecified: Secondary | ICD-10-CM

## 2019-07-28 DIAGNOSIS — Y92003 Bedroom of unspecified non-institutional (private) residence as the place of occurrence of the external cause: Secondary | ICD-10-CM | POA: Diagnosis not present

## 2019-07-28 DIAGNOSIS — M545 Low back pain: Secondary | ICD-10-CM | POA: Diagnosis not present

## 2019-07-28 DIAGNOSIS — Z20828 Contact with and (suspected) exposure to other viral communicable diseases: Secondary | ICD-10-CM | POA: Diagnosis present

## 2019-07-28 DIAGNOSIS — D62 Acute posthemorrhagic anemia: Secondary | ICD-10-CM | POA: Diagnosis present

## 2019-07-28 DIAGNOSIS — I420 Dilated cardiomyopathy: Secondary | ICD-10-CM

## 2019-07-28 DIAGNOSIS — N186 End stage renal disease: Secondary | ICD-10-CM | POA: Diagnosis present

## 2019-07-28 DIAGNOSIS — E1122 Type 2 diabetes mellitus with diabetic chronic kidney disease: Secondary | ICD-10-CM | POA: Diagnosis not present

## 2019-07-28 DIAGNOSIS — M7981 Nontraumatic hematoma of soft tissue: Secondary | ICD-10-CM

## 2019-07-28 DIAGNOSIS — I2510 Atherosclerotic heart disease of native coronary artery without angina pectoris: Secondary | ICD-10-CM | POA: Diagnosis not present

## 2019-07-28 DIAGNOSIS — Z8619 Personal history of other infectious and parasitic diseases: Secondary | ICD-10-CM | POA: Diagnosis not present

## 2019-07-28 DIAGNOSIS — E872 Acidosis: Secondary | ICD-10-CM | POA: Diagnosis present

## 2019-07-28 DIAGNOSIS — N189 Chronic kidney disease, unspecified: Secondary | ICD-10-CM | POA: Diagnosis not present

## 2019-07-28 DIAGNOSIS — I5042 Chronic combined systolic (congestive) and diastolic (congestive) heart failure: Secondary | ICD-10-CM | POA: Diagnosis not present

## 2019-07-28 DIAGNOSIS — J9601 Acute respiratory failure with hypoxia: Secondary | ICD-10-CM | POA: Diagnosis not present

## 2019-07-28 DIAGNOSIS — G9349 Other encephalopathy: Secondary | ICD-10-CM | POA: Diagnosis not present

## 2019-07-28 DIAGNOSIS — E669 Obesity, unspecified: Secondary | ICD-10-CM | POA: Diagnosis present

## 2019-07-28 DIAGNOSIS — I130 Hypertensive heart and chronic kidney disease with heart failure and stage 1 through stage 4 chronic kidney disease, or unspecified chronic kidney disease: Secondary | ICD-10-CM

## 2019-07-28 DIAGNOSIS — L84 Corns and callosities: Secondary | ICD-10-CM | POA: Diagnosis not present

## 2019-07-28 DIAGNOSIS — Z6837 Body mass index (BMI) 37.0-37.9, adult: Secondary | ICD-10-CM | POA: Diagnosis not present

## 2019-07-28 DIAGNOSIS — M5126 Other intervertebral disc displacement, lumbar region: Secondary | ICD-10-CM | POA: Diagnosis not present

## 2019-07-28 DIAGNOSIS — R222 Localized swelling, mass and lump, trunk: Secondary | ICD-10-CM | POA: Diagnosis not present

## 2019-07-28 DIAGNOSIS — Z89422 Acquired absence of other left toe(s): Secondary | ICD-10-CM | POA: Diagnosis not present

## 2019-07-28 DIAGNOSIS — Z951 Presence of aortocoronary bypass graft: Secondary | ICD-10-CM | POA: Diagnosis not present

## 2019-07-28 DIAGNOSIS — W06XXXA Fall from bed, initial encounter: Secondary | ICD-10-CM | POA: Diagnosis present

## 2019-07-28 DIAGNOSIS — D649 Anemia, unspecified: Secondary | ICD-10-CM | POA: Diagnosis not present

## 2019-07-28 DIAGNOSIS — R600 Localized edema: Secondary | ICD-10-CM | POA: Diagnosis not present

## 2019-07-28 DIAGNOSIS — R890 Abnormal level of enzymes in specimens from other organs, systems and tissues: Secondary | ICD-10-CM | POA: Diagnosis not present

## 2019-07-28 DIAGNOSIS — I429 Cardiomyopathy, unspecified: Secondary | ICD-10-CM | POA: Diagnosis not present

## 2019-07-28 DIAGNOSIS — N2581 Secondary hyperparathyroidism of renal origin: Secondary | ICD-10-CM | POA: Diagnosis not present

## 2019-07-28 DIAGNOSIS — L089 Local infection of the skin and subcutaneous tissue, unspecified: Secondary | ICD-10-CM | POA: Diagnosis not present

## 2019-07-28 DIAGNOSIS — N170 Acute kidney failure with tubular necrosis: Secondary | ICD-10-CM | POA: Diagnosis not present

## 2019-07-28 DIAGNOSIS — E1151 Type 2 diabetes mellitus with diabetic peripheral angiopathy without gangrene: Secondary | ICD-10-CM | POA: Diagnosis present

## 2019-07-28 DIAGNOSIS — I255 Ischemic cardiomyopathy: Secondary | ICD-10-CM | POA: Diagnosis present

## 2019-07-28 DIAGNOSIS — T148XXA Other injury of unspecified body region, initial encounter: Secondary | ICD-10-CM | POA: Diagnosis not present

## 2019-07-28 DIAGNOSIS — I5023 Acute on chronic systolic (congestive) heart failure: Secondary | ICD-10-CM | POA: Diagnosis present

## 2019-07-28 DIAGNOSIS — E1143 Type 2 diabetes mellitus with diabetic autonomic (poly)neuropathy: Secondary | ICD-10-CM | POA: Diagnosis present

## 2019-07-28 DIAGNOSIS — L0390 Cellulitis, unspecified: Secondary | ICD-10-CM | POA: Diagnosis not present

## 2019-07-28 DIAGNOSIS — N179 Acute kidney failure, unspecified: Secondary | ICD-10-CM | POA: Diagnosis not present

## 2019-07-28 LAB — CREATININE, SERUM
Creatinine, Ser: 3.39 mg/dL — ABNORMAL HIGH (ref 0.61–1.24)
GFR calc Af Amer: 22 mL/min — ABNORMAL LOW (ref >60)
GFR calc non Af Amer: 19 mL/min — ABNORMAL LOW (ref >60)

## 2019-07-28 LAB — GLUCOSE, CAPILLARY
Glucose-Capillary: 180 mg/dL — ABNORMAL HIGH (ref 70–99)
Glucose-Capillary: 222 mg/dL — ABNORMAL HIGH (ref 70–99)
Glucose-Capillary: 234 mg/dL — ABNORMAL HIGH (ref 70–99)
Glucose-Capillary: 232 mg/dL — ABNORMAL HIGH (ref 70–99)

## 2019-07-28 LAB — MAGNESIUM: Magnesium: 2.3 mg/dL (ref 1.7–2.4)

## 2019-07-28 LAB — TSH: TSH: 3.647 u[IU]/mL (ref 0.350–4.500)

## 2019-07-28 LAB — TROPONIN I (HIGH SENSITIVITY)
Troponin I (High Sensitivity): 35 ng/L — ABNORMAL HIGH (ref <18)
Troponin I (High Sensitivity): 31 ng/L — ABNORMAL HIGH (ref <18)

## 2019-07-28 MED ORDER — ASPIRIN EC 81 MG PO TBEC
81.0000 mg | DELAYED_RELEASE_TABLET | Freq: Every day | ORAL | Status: DC
  Administered 2019-07-28 – 2019-07-29 (×2): 81 mg via ORAL
  Filled 2019-07-28 (×2): qty 1

## 2019-07-28 MED ORDER — OXYCODONE HCL 5 MG PO TABS
10.0000 mg | ORAL_TABLET | ORAL | Status: DC | PRN
  Administered 2019-07-28 – 2019-08-02 (×14): 10 mg via ORAL
  Filled 2019-07-28 (×14): qty 2

## 2019-07-28 MED ORDER — FERROUS SULFATE 325 (65 FE) MG PO TABS
325.0000 mg | ORAL_TABLET | Freq: Every day | ORAL | Status: DC
  Administered 2019-07-28 – 2019-08-08 (×12): 325 mg via ORAL
  Filled 2019-07-28 (×12): qty 1

## 2019-07-28 MED ORDER — HYDROMORPHONE HCL 1 MG/ML IJ SOLN
1.0000 mg | INTRAMUSCULAR | Status: DC | PRN
  Administered 2019-07-28 – 2019-08-02 (×7): 1 mg via INTRAVENOUS
  Filled 2019-07-28 (×7): qty 1

## 2019-07-28 MED ORDER — INSULIN ASPART 100 UNIT/ML ~~LOC~~ SOLN
0.0000 [IU] | Freq: Three times a day (TID) | SUBCUTANEOUS | Status: DC
  Administered 2019-07-28 (×2): 3 [IU] via SUBCUTANEOUS
  Administered 2019-07-28: 2 [IU] via SUBCUTANEOUS
  Administered 2019-07-29: 1 [IU] via SUBCUTANEOUS
  Administered 2019-07-29 (×2): 2 [IU] via SUBCUTANEOUS
  Administered 2019-07-30 (×2): 1 [IU] via SUBCUTANEOUS
  Administered 2019-07-30: 3 [IU] via SUBCUTANEOUS
  Administered 2019-07-31 (×2): 1 [IU] via SUBCUTANEOUS
  Administered 2019-07-31: 2 [IU] via SUBCUTANEOUS
  Administered 2019-08-01: 3 [IU] via SUBCUTANEOUS
  Administered 2019-08-01: 1 [IU] via SUBCUTANEOUS
  Administered 2019-08-01: 3 [IU] via SUBCUTANEOUS
  Administered 2019-08-02 – 2019-08-03 (×4): 2 [IU] via SUBCUTANEOUS
  Administered 2019-08-03 (×2): 3 [IU] via SUBCUTANEOUS
  Administered 2019-08-04: 2 [IU] via SUBCUTANEOUS
  Administered 2019-08-04 (×2): 3 [IU] via SUBCUTANEOUS
  Administered 2019-08-05: 9 [IU] via SUBCUTANEOUS
  Administered 2019-08-05 (×2): 2 [IU] via SUBCUTANEOUS
  Administered 2019-08-06: 3 [IU] via SUBCUTANEOUS
  Administered 2019-08-06: 5 [IU] via SUBCUTANEOUS
  Administered 2019-08-06: 7 [IU] via SUBCUTANEOUS
  Administered 2019-08-07 (×3): 3 [IU] via SUBCUTANEOUS
  Administered 2019-08-08 (×2): 5 [IU] via SUBCUTANEOUS
  Filled 2019-07-28 (×35): qty 1

## 2019-07-28 MED ORDER — ONDANSETRON HCL 4 MG/2ML IJ SOLN: 4.0000 mg | Freq: Four times a day (QID) | INTRAMUSCULAR | Status: DC | PRN

## 2019-07-28 MED ORDER — LOSARTAN POTASSIUM 25 MG PO TABS: 25.0000 mg | ORAL_TABLET | Freq: Every day | ORAL | Status: DC

## 2019-07-28 MED ORDER — HEPARIN SODIUM (PORCINE) 5000 UNIT/ML IJ SOLN
5000.0000 [IU] | Freq: Three times a day (TID) | INTRAMUSCULAR | Status: DC
  Administered 2019-07-28 (×2): 5000 [IU] via SUBCUTANEOUS
  Filled 2019-07-28 (×2): qty 1

## 2019-07-28 MED ORDER — FAMOTIDINE 20 MG PO TABS
20.0000 mg | ORAL_TABLET | Freq: Every day | ORAL | Status: DC
  Administered 2019-07-28 – 2019-08-07 (×11): 20 mg via ORAL
  Filled 2019-07-28 (×11): qty 1

## 2019-07-28 MED ORDER — ACETAMINOPHEN 650 MG RE SUPP: 650.0000 mg | Freq: Four times a day (QID) | RECTAL | Status: DC | PRN

## 2019-07-28 MED ORDER — HYDROCODONE-ACETAMINOPHEN 5-325 MG PO TABS
1.0000 | ORAL_TABLET | ORAL | Status: DC | PRN
  Administered 2019-07-28 (×2): 1 via ORAL
  Filled 2019-07-28 (×2): qty 1

## 2019-07-28 MED ORDER — VANCOMYCIN HCL 10 G IV SOLR: 1500.0000 mg | INTRAVENOUS | Status: DC

## 2019-07-28 MED ORDER — SODIUM CHLORIDE 0.9 % IV SOLN
2.0000 g | INTRAVENOUS | Status: DC
  Administered 2019-07-28: 2 g via INTRAVENOUS
  Filled 2019-07-28: qty 20
  Filled 2019-07-28: qty 2

## 2019-07-28 MED ORDER — ACETAMINOPHEN 325 MG PO TABS: 650.0000 mg | ORAL_TABLET | Freq: Four times a day (QID) | ORAL | Status: DC | PRN

## 2019-07-28 MED ORDER — CARVEDILOL 25 MG PO TABS
25.0000 mg | ORAL_TABLET | Freq: Two times a day (BID) | ORAL | Status: DC
  Administered 2019-07-28 – 2019-08-08 (×25): 25 mg via ORAL
  Filled 2019-07-28 (×25): qty 1

## 2019-07-28 MED ORDER — DOCUSATE SODIUM 100 MG PO CAPS
100.0000 mg | ORAL_CAPSULE | Freq: Two times a day (BID) | ORAL | Status: DC
  Administered 2019-07-28 – 2019-08-03 (×13): 100 mg via ORAL
  Filled 2019-07-28 (×13): qty 1

## 2019-07-28 MED ORDER — POLYETHYLENE GLYCOL 3350 17 G PO PACK
17.0000 g | PACK | Freq: Every day | ORAL | Status: DC
  Administered 2019-07-28 – 2019-08-08 (×9): 17 g via ORAL
  Filled 2019-07-28 (×11): qty 1

## 2019-07-28 MED ORDER — INSULIN ASPART 100 UNIT/ML ~~LOC~~ SOLN
0.0000 [IU] | Freq: Every day | SUBCUTANEOUS | Status: DC
  Administered 2019-07-28 – 2019-08-04 (×5): 2 [IU] via SUBCUTANEOUS
  Administered 2019-08-05: 5 [IU] via SUBCUTANEOUS
  Administered 2019-08-06: 3 [IU] via SUBCUTANEOUS
  Administered 2019-08-07: 21:00:00 4 [IU] via SUBCUTANEOUS
  Filled 2019-07-28 (×8): qty 1

## 2019-07-28 MED ORDER — ATORVASTATIN CALCIUM 20 MG PO TABS
40.0000 mg | ORAL_TABLET | Freq: Every day | ORAL | Status: DC
  Administered 2019-07-28 – 2019-08-07 (×11): 40 mg via ORAL
  Filled 2019-07-28 (×11): qty 2

## 2019-07-28 MED ORDER — VANCOMYCIN HCL 10 G IV SOLR
1750.0000 mg | INTRAVENOUS | Status: DC
  Filled 2019-07-28: qty 1750

## 2019-07-28 MED ORDER — NITROGLYCERIN 0.4 MG SL SUBL: 0.4000 mg | SUBLINGUAL_TABLET | SUBLINGUAL | Status: DC | PRN

## 2019-07-28 MED ORDER — SODIUM BICARBONATE 650 MG PO TABS
1300.0000 mg | ORAL_TABLET | Freq: Two times a day (BID) | ORAL | Status: DC
  Administered 2019-07-28 – 2019-08-08 (×24): 1300 mg via ORAL
  Filled 2019-07-28 (×24): qty 2

## 2019-07-28 MED ORDER — HYDRALAZINE HCL 25 MG PO TABS
25.0000 mg | ORAL_TABLET | Freq: Two times a day (BID) | ORAL | Status: DC
  Administered 2019-07-28 – 2019-08-07 (×22): 25 mg via ORAL
  Filled 2019-07-28 (×22): qty 1

## 2019-07-28 MED ORDER — ISOSORBIDE MONONITRATE ER 30 MG PO TB24
30.0000 mg | ORAL_TABLET | Freq: Every day | ORAL | Status: DC
  Administered 2019-07-28 – 2019-08-08 (×12): 30 mg via ORAL
  Filled 2019-07-28 (×11): qty 1

## 2019-07-28 MED ORDER — ONDANSETRON HCL 4 MG PO TABS: 4.0000 mg | ORAL_TABLET | Freq: Four times a day (QID) | ORAL | Status: DC | PRN

## 2019-07-28 MED ORDER — FUROSEMIDE 10 MG/ML IJ SOLN
40.0000 mg | Freq: Two times a day (BID) | INTRAMUSCULAR | Status: DC
  Administered 2019-07-28 – 2019-07-31 (×6): 40 mg via INTRAVENOUS
  Filled 2019-07-28 (×6): qty 4

## 2019-07-28 NOTE — H&P (Signed)
[name] [name] is an 58 y.o. male.   Chief Complaint: Shortness of breath HPI: The patient with past medical history of CAD status post CABG, CKD, diabetes, hyperlipidemia and hypertension presents emergency department complaining of shortness of breath.  The patient reports concomitant lower extremity edema which prompted him to use his Lasix that he has prescribed as needed for swelling.  Despite his diuretics the patient reports increased dyspnea.  He denies cough or frothy sputum.  The patient also denies travel risk factors or known contact with individuals with exposure to novel coronavirus.  He was given Lasix 40 mg IV with some relief.  He denies chest pain but he does admit to back pain that he attributes to picking up something heavy.  Point tenderness prompted CT scan which showed an area concerning for abscess formation which prompted initiation of broad-spectrum antibiotics before the emergency department staff called the hospitalist service for admission.   Past Medical History:  Diagnosis Date  . CAD (coronary artery disease)    a. 04/2014 CABG x 4 (LIMA->LAD, VG->Diag, VG->OM, VG->PDA).  . Chronic systolic CHF (congestive heart failure) ([place])    a. 07/2014 Echo: EF 30-35%.  . CKD (chronic kidney disease), stage III ([place])    a. 12/2015 Creat 1.8.  . Diabetic foot ulcers ([place])    a. left foot 2nd digit ant 5 th digit 05/03/14; b. 08/2014 s/p amputation of toes on L foot.  . Hypercholesterolemia    a. 12/2015 TC 116, TG 154, HDL 24, LDL 61-->Atorvastatin 40.  Marland Kitchen Hypertension   . Hypertensive heart disease   . Ischemic cardiomyopathy    a. 07/2013 Echo: EF 20%; b. 07/2014 Echo: EF 30-35%, diff HK, Gr1 DD, mildly dil LA, nl PASP.  Marland Kitchen Myocardial infarction ([place])   . Neuropathy in diabetes ([place])   . Open wound    foot  . Orthostatic hypotension   . Peripheral vascular disease ([place])   . Type II diabetes mellitus ([place])    a. 12/2015 HbA1c = 13.9.    Past Surgical History:  Procedure  Laterality Date  . ACHILLES TENDON SURGERY Right 01/22/2017   Procedure: ACHILLES LENGTHENING/KIDNER/TAL/Teno achilles lengthening;  Surgeon: Samara Deist, DPM;  Location: [facility] ORS;  Service: Podiatry;  Laterality: Right;  . CARDIAC CATHETERIZATION     03/2014  . CATARACT EXTRACTION    . CORONARY ARTERY BYPASS GRAFT N/A 05/07/2014   Procedure: CORONARY ARTERY BYPASS GRAFTING (CABG);  Surgeon: Gaye Pollack, MD;  Location: [place];  Service: Open Heart Surgery;  Laterality: N/A;  Times 4 using left internal mammary artery and endoscopically harvested right saphenous vein  . INTRAOPERATIVE TRANSESOPHAGEAL ECHOCARDIOGRAM N/A 05/07/2014   Procedure: INTRAOPERATIVE TRANSESOPHAGEAL ECHOCARDIOGRAM;  Surgeon: Gaye Pollack, MD;  Location: [facility] OR;  Service: Open Heart Surgery;  Laterality: N/A;  . left foot osteomyelitis and wound after surgery    . TOE AMPUTATION     Left 3 and 4 toes  . TONSILECTOMY/ADENOIDECTOMY WITH MYRINGOTOMY      Family History  Problem Relation Age of Onset  . Heart disease Father        CABG in his 45's  . Diabetes type II Other   . Kidney disease Other   . Hypertension Other   . Ovarian cancer Mother    Social History:  reports that he has never smoked. He has never used smokeless tobacco. He reports that he does not drink alcohol or use drugs.  Allergies:  Allergies  Allergen Reactions  .  Other Other (See Comments)    Pt. And wife state that he had a reaction to "some" antibiotic but they do not know what the name of it was.They are are very apprehensive about him receiving any antibiotic. Zosyn, Clindamycin, & Vancomycin (all started together unsure which caused reaction 07/18/2013)    Medications Prior to Admission  Medication Sig Dispense Refill  . aspirin EC 81 MG tablet Take 81 mg by mouth daily.    Marland Kitchen atorvastatin (LIPITOR) 40 MG tablet Take 1 tablet (40 mg total) by mouth at bedtime. 90 tablet 3  . carvedilol (COREG) 25 MG tablet Take 1 tablet (25 mg total)  by mouth 2 (two) times daily with a meal. (Patient taking differently: Take 25 mg by mouth at bedtime. ) 180 tablet 3  . Cyanocobalamin (VITAMIN B 12 PO) Take 1 tablet by mouth at bedtime.     . famotidine (PEPCID) 20 MG tablet TAKE 1 TABLET BY MOUTH EVERY DAY (Patient taking differently: Take 20 mg by mouth at bedtime. ) 90 tablet 0  . ferrous sulfate 325 (65 FE) MG tablet Take 325 mg by mouth daily.    Marland Kitchen glimepiride (AMARYL) 4 MG tablet Take 4 mg by mouth 2 (two) times daily with a meal.     . hydrALAZINE (APRESOLINE) 25 MG tablet Take 1 tablet (25 mg total) by mouth 2 (two) times daily. 180 tablet 3  . isosorbide mononitrate (IMDUR) 60 MG 24 hr tablet Take 0.5 tablets (30 mg total) by mouth daily. 90 tablet 3  . losartan (COZAAR) 25 MG tablet Take 25 mg by mouth at bedtime.     . nitroGLYCERIN (NITROSTAT) 0.4 MG SL tablet Place 1 tablet (0.4 mg total) under the tongue every 5 (five) minutes as needed for chest pain. 30 tablet 1  . sodium bicarbonate 650 MG tablet Take 1,300 mg by mouth 2 (two) times daily.     Marland Kitchen HYDROcodone-acetaminophen (NORCO/VICODIN) 5-325 MG tablet Take by mouth as needed.    . polyethylene glycol (MIRALAX) 17 g packet Take 17 g by mouth daily. (Patient not taking: Reported on 07/27/2019) 14 each 0    Results for orders placed or performed during the hospital encounter of 07/27/19 (from the past 48 hour(s))  Basic metabolic panel     Status: Abnormal   Collection Time: 07/27/19  5:11 PM  Result Value Ref Range   Sodium 134 (L) 135 - 145 mmol/L   Potassium 4.7 3.5 - 5.1 mmol/L   Chloride 106 98 - 111 mmol/L   CO2 20 (L) 22 - 32 mmol/L   Glucose, Bld 344 (H) 70 - 99 mg/dL   BUN 69 (H) 6 - 20 mg/dL   Creatinine, Ser 3.55 (H) 0.61 - 1.24 mg/dL   Calcium 8.3 (L) 8.9 - 10.3 mg/dL   GFR calc non Af Amer 18 (L) >60 mL/min   GFR calc Af Amer 21 (L) >60 mL/min   Anion gap 8 5 - 15    Comment: Performed at [facility], [address] [place], [place] [address]   CBC     Status: Abnormal   Collection Time: 07/27/19  5:11 PM  Result Value Ref Range   WBC 14.5 (H) 4.0 - 10.5 K/uL   RBC 2.71 (L) 4.22 - 5.81 MIL/uL   Hemoglobin 8.0 (L) 13.0 - 17.0 g/dL   HCT 25.0 (L) 39.0 - 52.0 %   MCV 92.3 80.0 - 100.0 fL   MCH 29.5 26.0 - 34.0 pg  MCHC 32.0 30.0 - 36.0 g/dL   RDW 13.1 11.5 - 15.5 %   Platelets 309 150 - 400 K/uL   nRBC 0.0 0.0 - 0.2 %    Comment: Performed at [facility], [place], [place] [address]  Troponin I (High Sensitivity)     Status: Abnormal   Collection Time: 07/27/19  5:11 PM  Result Value Ref Range   Troponin I (High Sensitivity) 32 (H) <18 ng/L    Comment: (NOTE) Elevated high sensitivity troponin I (hsTnI) values and significant  changes across serial measurements may suggest ACS but many other  chronic and acute conditions are known to elevate hsTnI results.  Refer to the "Links" section for chest pain algorithms and additional  guidance. Performed at [facility], [place], [place] [address]   Troponin I (High Sensitivity)     Status: Abnormal   Collection Time: 07/27/19  7:09 PM  Result Value Ref Range   Troponin I (High Sensitivity) 55 (H) <18 ng/L    Comment: READ BACK AND VERIFIED WITH Odis Hollingshead RN AT 2121 ON 07/27/2019 SNG (NOTE) Elevated high sensitivity troponin I (hsTnI) values and significant  changes across serial measurements may suggest ACS but many other  chronic and acute conditions are known to elevate hsTnI results.  Refer to the "Links" section for chest pain algorithms and additional  guidance. Performed at [facility], [address] [place], [place] [address]   SARS Coronavirus 2 [facility] order, Performed in [facility] hospital lab) Nasopharyngeal Nasopharyngeal Swab     Status: None   Collection Time: 07/27/19 10:36 PM   Specimen: Nasopharyngeal Swab  Result Value Ref Range   SARS Coronavirus 2 NEGATIVE NEGATIVE    Comment:  (NOTE) If result is NEGATIVE SARS-CoV-2 target nucleic acids are NOT DETECTED. The SARS-CoV-2 RNA is generally detectable in upper and lower  respiratory specimens during the acute phase of infection. The lowest  concentration of SARS-CoV-2 viral copies this assay can detect is 250  copies / mL. A negative result does not preclude SARS-CoV-2 infection  and should not be used as the sole basis for treatment or other  patient management decisions.  A negative result may occur with  improper specimen collection / handling, submission of specimen other  than nasopharyngeal swab, presence of viral mutation(s) within the  areas targeted by this assay, and inadequate number of viral copies  (<250 copies / mL). A negative result must be combined with clinical  observations, patient history, and epidemiological information. If result is POSITIVE SARS-CoV-2 target nucleic acids are DETECTED. The SARS-CoV-2 RNA is generally detectable in upper and lower  respiratory specimens dur ing the acute phase of infection.  Positive  results are indicative of active infection with SARS-CoV-2.  Clinical  correlation with patient history and other diagnostic information is  necessary to determine patient infection status.  Positive results do  not rule out bacterial infection or co-infection with other viruses. If result is PRESUMPTIVE POSTIVE SARS-CoV-2 nucleic acids MAY BE PRESENT.   A presumptive positive result was obtained on the submitted specimen  and confirmed on repeat testing.  While 2019 novel coronavirus  (SARS-CoV-2) nucleic acids may be present in the submitted sample  additional confirmatory testing may be necessary for epidemiological  and / or clinical management purposes  to differentiate between  SARS-CoV-2 and other Sarbecovirus currently known to infect humans.  If clinically indicated additional testing with an alternate test  methodology [phone])  is advised. The SARS-CoV-2 RNA is  generally  detectable in upper and lower respiratory sp ecimens during the acute  phase of infection. The expected result is Negative. Fact Sheet for Patients:  StrictlyIdeas.no Fact Sheet for Healthcare Providers: BankingDealers.co.za This test is not yet approved or cleared by the [place] FDA and has been authorized for detection and/or diagnosis of SARS-CoV-2 by FDA under an Emergency Use Authorization (EUA).  This EUA will remain in effect (meaning this test can be used) for the duration of the COVID-19 declaration under Section 564(b)(1) of the Act, 21 U.S.C. section 360bbb-3(b)(1), unless the authorization is terminated or revoked sooner. Performed at [facility], [address] [place], [place] [address]   TSH     Status: None   Collection Time: 07/28/19  3:03 AM  Result Value Ref Range   TSH 3.647 0.350 - 4.500 uIU/mL    Comment: Performed by a 3rd Generation assay with a functional sensitivity of <=0.01 uIU/mL. Performed at [facility], [place], [place] [address]   Troponin I (High Sensitivity)     Status: Abnormal   Collection Time: 07/28/19  3:03 AM  Result Value Ref Range   Troponin I (High Sensitivity) 35 (H) <18 ng/L    Comment: (NOTE) Elevated high sensitivity troponin I (hsTnI) values and significant  changes across serial measurements may suggest ACS but many other  chronic and acute conditions are known to elevate hsTnI results.  Refer to the "Links" section for chest pain algorithms and additional  guidance. Performed at [facility], [address] [place], [place] [address]    Dg Chest 2 View  Result Date: 07/27/2019 CLINICAL DATA:  Shortness of breath EXAM: CHEST - 2 VIEW COMPARISON:  10/19/2018 FINDINGS: Cardiomegaly status post median sternotomy. Pulmonary vascular prominence without overt edema or other airspace opacity. The visualized skeletal  structures are unremarkable. IMPRESSION: Cardiomegaly. Pulmonary vascular prominence without overt edema or other airspace opacity. Electronically Signed   By: Eddie Candle M.D.   On: 07/27/2019 17:48   Ct Lumbar Spine Wo Contrast  Result Date: 07/27/2019 CLINICAL DATA:  Back pain EXAM: CT LUMBAR SPINE WITHOUT CONTRAST TECHNIQUE: Multidetector CT imaging of the lumbar spine was performed without intravenous contrast administration. Multiplanar CT image reconstructions were also generated. COMPARISON:  July 11, 2019 FINDINGS: Segmentation: There are 5 non-rib bearing lumbar type vertebral bodies with the last intervertebral disc space labeled as L5-S1. Alignment: Normal Vertebrae: The vertebral body heights are well maintained. No fracture, malalignment, or pathologic osseous lesions seen. Paraspinal and other soft tissues: There is new soft tissue edema and focal heterogeneous soft tissue density seen overlying L4-L5 . Surrounding subcutaneous edema and skin thickening is seen within this region. The sacroiliac joints appear to be intact. Scattered aortic atherosclerosis is noted. Disc levels: Mild disc height loss and facet arthrosis most notable at L4-L5 and L5-S1. IMPRESSION: There is new significant soft tissue edema and heterogeneous soft tissue collection seen at L4-L5 in the posterior paraspinal soft tissues which could represent early phlegmon versus soft tissue hematoma given the patient's history. If further evaluation is required would recommend MRI with contrast. These results were called by telephone at the time of interpretation on 07/27/2019 at 11:29 pm to provider PHILLIP STAFFORD , who verbally acknowledged these results. Electronically Signed   By: Prudencio Pair M.D.   On: 07/27/2019 23:30    Review of Systems  Constitutional: Negative for chills and fever.  HENT: Negative for sore  throat and tinnitus.   Eyes: Negative for blurred vision and redness.  Respiratory: Positive for shortness  of breath. Negative for cough.   Cardiovascular: Negative for chest pain, palpitations, orthopnea and PND.  Gastrointestinal: Negative for abdominal pain, diarrhea, nausea and vomiting.  Genitourinary: Negative for dysuria, frequency and urgency.  Musculoskeletal: Positive for back pain. Negative for joint pain and myalgias.  Skin: Negative for rash.       No lesions  Neurological: Negative for speech change, focal weakness and weakness.  Endo/Heme/Allergies: Does not bruise/bleed easily.       No temperature intolerance  Psychiatric/Behavioral: Negative for depression and suicidal ideas.    Blood pressure (!) 167/87, pulse 97, temperature 98.7 F (37.1 C), temperature source Oral, resp. rate 18, height 6\' 1"  (1.854 m), weight 124.4 kg, SpO2 95 %. Physical Exam  Constitutional: He is oriented to person, place, and time. He appears well-developed and well-nourished. No distress.  HENT:  Head: Normocephalic and atraumatic.  Mouth/Throat: Oropharynx is clear and moist.  Eyes: Pupils are equal, round, and reactive to light. Conjunctivae and EOM are normal. No scleral icterus.  Neck: Normal range of motion. Neck supple. No JVD present. No tracheal deviation present. No thyromegaly present.  Cardiovascular: Normal rate, regular rhythm and normal heart sounds. Exam reveals no gallop and no friction rub.  No murmur heard. Respiratory: Effort normal and breath sounds normal. No respiratory distress.  GI: Soft. Bowel sounds are normal. He exhibits no distension. There is no abdominal tenderness.  Genitourinary:    Genitourinary Comments: Deferred   Musculoskeletal: Normal range of motion.        General: Edema present.  Lymphadenopathy:    He has no cervical adenopathy.  Neurological: He is alert and oriented to person, place, and time. No cranial nerve deficit.  Skin: Skin is warm and dry. No rash noted. No erythema.  Psychiatric: He has a normal mood and affect. His behavior is normal.  Judgment and thought content normal.     Assessment/Plan This is a 58 year old male admitted for respiratory failure. 1.  Respiratory failure: Acute on chronic; with hypoxia.  No pulmonary edema although the patient has some vascular congestion and may have had some transient pulmonary edema relieved by Lasix over the last few days.  Appearance of chest x-ray also consistent with hyperinflation seen and COPD but the patient does not carry this diagnosis.  Oxygen saturations are normal on room air.  Monitor respiratory status 2.  Elevated troponin: Likely secondary to demand ischemia.  Continue to follow cardiac biomarkers.  Monitor telemetry. 3.  AKI: Superimposed upon chronic kidney injury.  Result of increased diuretic therapy.  Avoid nephrotoxic agents. 4.  Low back pain: Questionable area of infection/phlegmon and/or hematoma in lumbar vertebrae.  Patient with associated leukocytosis concerning for abscess formation.  Continue broad-spectrum antibiotics.  Further evaluate with MRI as necessary. 5.  DVT prophylaxis: Heparin 6.  GI prophylaxis: None The patient is a full code.  Time spent on admission orders and patient care approximately 45 minutes  Harrie Foreman, MD 07/28/2019, 4:08 AM

## 2019-07-28 NOTE — ED Notes (Signed)
IV location changed in [facility]; previous RN documented IV was in left forearm-noted at this time to be placed in the right.

## 2019-07-28 NOTE — Progress Notes (Signed)
[facility]  ROUNDING NOTE   Subjective:   [name] [name] admitted to [facility] on 07/27/2019 for Peripheral edema [R60.9] Acute midline low back pain without sciatica [M54.5] Stage 4 chronic kidney disease ([place]) [N18.4] Acute on chronic congestive heart failure, unspecified heart failure type [facility]) [I50.9]  Patient well known to our practice. Followed by chronic kidney disease stage IV with hyperkalemia, metabolic acidosis, and nephrotic range proteinuria secondary to diabetic nephropathy.   Wife at bedside. With weakness and lesions in  L4-L5   Objective:  Vital signs in last 24 hours:  Temp:  [98.7 F (37.1 C)-98.8 F (37.1 C)] 98.7 F (37.1 C) (09/11 0246) Pulse Rate:  [80-97] 81 (09/11 0914) Resp:  [14-18] 18 (09/11 0246) BP: (120-173)/(46-98) 120/46 (09/11 0914) SpO2:  [93 %-97 %] 94 % (09/11 0914) Weight:  [124.4 kg-124.7 kg] 124.4 kg (09/11 0246)  Weight change:  Filed Weights   07/27/19 1707 07/28/19 0246  Weight: 124.7 kg 124.4 kg    Intake/Output: I/O last 3 completed shifts: In: 659.8 [IV Piggyback:659.8] Out: 9935 [Urine:1025]   Intake/Output this shift:  Total I/O In: 120 [P.O.:120] Out: -   Physical Exam: General: NAD,   Head: Normocephalic, atraumatic. Moist oral mucosal membranes  Eyes: Anicteric, PERRL  Neck: Supple, trachea midline  Lungs:  Clear to auscultation  Heart: Regular rate and rhythm  Abdomen:  Soft, nontender,   Extremities:  + peripheral edema.  Neurologic: Nonfocal, moving all four extremities  Skin: No lesions        Basic Metabolic Panel: Recent Labs  Lab 07/27/19 1711 07/28/19 0547  NA 134*  --   K 4.7  --   CL 106  --   CO2 20*  --   GLUCOSE 344*  --   BUN 69*  --   CREATININE 3.55* 3.39*  CALCIUM 8.3*  --     Liver Function Tests: No results for input(s): AST, ALT, ALKPHOS, BILITOT, PROT, ALBUMIN in the last 168 hours. No results for input(s): LIPASE, AMYLASE in the last 168 hours. No results  for input(s): AMMONIA in the last 168 hours.  CBC: Recent Labs  Lab 07/27/19 1711  WBC 14.5*  HGB 8.0*  HCT 25.0*  MCV 92.3  PLT 309    Cardiac Enzymes: No results for input(s): CKTOTAL, CKMB, CKMBINDEX, TROPONINI in the last 168 hours.  BNP: Invalid input(s): POCBNP  CBG: Recent Labs  Lab 07/28/19 0759 07/28/19 1117  GLUCAP 180* 222*    Microbiology: Results for orders placed or performed during the hospital encounter of 07/27/19  SARS Coronavirus 2 [facility] order, Performed in [facility] hospital lab) Nasopharyngeal Nasopharyngeal Swab     Status: None   Collection Time: 07/27/19 10:36 PM   Specimen: Nasopharyngeal Swab  Result Value Ref Range Status   SARS Coronavirus 2 NEGATIVE NEGATIVE Final    Comment: (NOTE) If result is NEGATIVE SARS-CoV-2 target nucleic acids are NOT DETECTED. The SARS-CoV-2 RNA is generally detectable in upper and lower  respiratory specimens during the acute phase of infection. The lowest  concentration of SARS-CoV-2 viral copies this assay can detect is 250  copies / mL. A negative result does not preclude SARS-CoV-2 infection  and should not be used as the sole basis for treatment or other  patient management decisions.  A negative result may occur with  improper specimen collection / handling, submission of specimen other  than nasopharyngeal swab, presence of viral mutation(s) within the  areas targeted by this assay, and  inadequate number of viral copies  (<250 copies / mL). A negative result must be combined with clinical  observations, patient history, and epidemiological information. If result is POSITIVE SARS-CoV-2 target nucleic acids are DETECTED. The SARS-CoV-2 RNA is generally detectable in upper and lower  respiratory specimens dur ing the acute phase of infection.  Positive  results are indicative of active infection with SARS-CoV-2.  Clinical  correlation with patient history and other diagnostic information is   necessary to determine patient infection status.  Positive results do  not rule out bacterial infection or co-infection with other viruses. If result is PRESUMPTIVE POSTIVE SARS-CoV-2 nucleic acids MAY BE PRESENT.   A presumptive positive result was obtained on the submitted specimen  and confirmed on repeat testing.  While 2019 novel coronavirus  (SARS-CoV-2) nucleic acids may be present in the submitted sample  additional confirmatory testing may be necessary for epidemiological  and / or clinical management purposes  to differentiate between  SARS-CoV-2 and other Sarbecovirus currently known to infect humans.  If clinically indicated additional testing with an alternate test  methodology [phone]) is advised. The SARS-CoV-2 RNA is generally  detectable in upper and lower respiratory sp ecimens during the acute  phase of infection. The expected result is Negative. Fact Sheet for Patients:  StrictlyIdeas.no Fact Sheet for Healthcare Providers: BankingDealers.co.za This test is not yet approved or cleared by the [place] FDA and has been authorized for detection and/or diagnosis of SARS-CoV-2 by FDA under an Emergency Use Authorization (EUA).  This EUA will remain in effect (meaning this test can be used) for the duration of the COVID-19 declaration under Section 564(b)(1) of the Act, 21 U.S.C. section 360bbb-3(b)(1), unless the authorization is terminated or revoked sooner. Performed at [facility], [address] [place], [place] [address]     Coagulation Studies: No results for input(s): LABPROT, INR in the last 72 hours.  Urinalysis: No results for input(s): COLORURINE, LABSPEC, PHURINE, GLUCOSEU, HGBUR, BILIRUBINUR, KETONESUR, PROTEINUR, UROBILINOGEN, NITRITE, LEUKOCYTESUR in the last 72 hours.  Invalid input(s): APPERANCEUR    Imaging: Dg Chest 2 View  Result Date: 07/27/2019 CLINICAL DATA:  Shortness of  breath EXAM: CHEST - 2 VIEW COMPARISON:  10/19/2018 FINDINGS: Cardiomegaly status post median sternotomy. Pulmonary vascular prominence without overt edema or other airspace opacity. The visualized skeletal structures are unremarkable. IMPRESSION: Cardiomegaly. Pulmonary vascular prominence without overt edema or other airspace opacity. Electronically Signed   By: Eddie Candle M.D.   On: 07/27/2019 17:48   Ct Lumbar Spine Wo Contrast  Result Date: 07/27/2019 CLINICAL DATA:  Back pain EXAM: CT LUMBAR SPINE WITHOUT CONTRAST TECHNIQUE: Multidetector CT imaging of the lumbar spine was performed without intravenous contrast administration. Multiplanar CT image reconstructions were also generated. COMPARISON:  July 11, 2019 FINDINGS: Segmentation: There are 5 non-rib bearing lumbar type vertebral bodies with the last intervertebral disc space labeled as L5-S1. Alignment: Normal Vertebrae: The vertebral body heights are well maintained. No fracture, malalignment, or pathologic osseous lesions seen. Paraspinal and other soft tissues: There is new soft tissue edema and focal heterogeneous soft tissue density seen overlying L4-L5 . Surrounding subcutaneous edema and skin thickening is seen within this region. The sacroiliac joints appear to be intact. Scattered aortic atherosclerosis is noted. Disc levels: Mild disc height loss and facet arthrosis most notable at L4-L5 and L5-S1. IMPRESSION: There is new significant soft tissue edema and heterogeneous soft tissue collection seen at L4-L5 in the posterior paraspinal soft tissues which could represent early phlegmon  versus soft tissue hematoma given the patient's history. If further evaluation is required would recommend MRI with contrast. These results were called by telephone at the time of interpretation on 07/27/2019 at 11:29 pm to provider PHILLIP STAFFORD , who verbally acknowledged these results. Electronically Signed   By: Prudencio Pair M.D.   On: 07/27/2019 23:30      Medications:   . cefTRIAXone (ROCEPHIN)  IV    . [START ON 07/29/2019] vancomycin     . aspirin EC  81 mg Oral Daily  . atorvastatin  40 mg Oral QHS  . carvedilol  25 mg Oral BID WC  . docusate sodium  100 mg Oral BID  . famotidine  20 mg Oral QHS  . ferrous sulfate  325 mg Oral Daily  . heparin  5,000 Units Subcutaneous Q8H  . hydrALAZINE  25 mg Oral BID  . insulin aspart  0-5 Units Subcutaneous QHS  . insulin aspart  0-9 Units Subcutaneous TID WC  . isosorbide mononitrate  30 mg Oral Daily  . polyethylene glycol  17 g Oral Daily  . sodium bicarbonate  1,300 mg Oral BID   acetaminophen **OR** acetaminophen, nitroGLYCERIN, ondansetron **OR** ondansetron (ZOFRAN) IV, oxyCODONE  Assessment/ Plan:  [name] [name] is a 58 y.o. white male with hypertension, diabetes mellitus type II, diabetic neuropathy, systolic congestive heart failure, peripheral vascular disease status post left toe amputations, autonomic neuropathy, coronary artery disease status post CABG, history of osteomyelitis, who has been admitted to [facility] on 07/27/2019 for acute exacerbation of congestive heart failure.   1. Acute renal failure on chronic kidney disease stage IV with nephrotic range proteinuria: baseline creatinine of 3.02, GFR of 22 on 07/13/2019.  Chronic kidney disease secondary to diabetic nephropathy. Chronic kidney disease complicated with hyperkalemia, metabolic acidosis and hypoalbuminemia.  Acute renal failure secondary to acute cardiorenal syndrome.  - Continue sodium bicarbonate.   2. Hypertension and acute exacerbation of chronic systolic congestive heart failure. Home regimen of losartan, carvedilol, hydralazine, furosemide, and isosorbide mononitrate - IV furosemide  3. Anemia with chronic kidney disease: hemoglobin 8, normocytic.   4. Diabetes mellitus type II with chronic kidney disease: hemoglobin A1c of 6.9% 05/23/2019.    LOS: 0 Janaisha Tolsma 9/11/20201:49 PM

## 2019-07-28 NOTE — TOC Initial Note (Addendum)
Transition of Care [facility]) - Initial/Assessment Note    Patient Details  Name: [name] [name] MRN: [id number] Date of Birth: May 30, 1961  [facility] CM/SW Contact:    Elza Rafter, RN Phone Number: 07/28/2019, 2:41 PM  Clinical Narrative:   Patient is from home with spouse.  Admitted with shortness of breath and mi-lower back pain.  Currently in MRI.  Spoke with wife in patient room.  Current with PCP-Hande.  Obtains medications at [facility] in [place] and through [facility] mail order without difficulty.  Denies difficulty obtaining diabetes supplies and medications.  For the past month patient has not been well per spouse.  Prior to 1 month ago he was independent in all ADL's.  History of CHF-has a functioning scale at home and weighs daily.  He has put on weight over the last several weeks.  BNP elevated at 716.  94% on room air; no oxygen at home. Not current with the [facility].  Will continue to follow and assist with progression of patient.                      Barriers to Discharge: Continued Medical Work up   Patient Goals and CMS Choice        Expected Discharge Plan and Services         Living arrangements for the past 2 months: Single Family Home                                      Prior Living Arrangements/Services Living arrangements for the past 2 months: Single Family Home Lives with:: Spouse                   Activities of Daily Living Home Assistive Devices/Equipment: CBG Meter, Blood pressure cuff ADL Screening (condition at time of admission) Patient's cognitive ability adequate to safely complete daily activities?: Yes Is the patient deaf or have difficulty hearing?: No Does the patient have difficulty seeing, even when wearing glasses/contacts?: No Does the patient have difficulty concentrating, remembering, or making decisions?: No Patient able to express need for assistance with ADLs?: Yes Does the patient have  difficulty dressing or bathing?: No Independently performs ADLs?: Yes (appropriate for developmental age) Does the patient have difficulty walking or climbing stairs?: No Weakness of Legs: None Weakness of Arms/Hands: None  Permission Sought/Granted                  Emotional Assessment Appearance:: Appears stated age Attitude/Demeanor/Rapport: Gracious Affect (typically observed): Accepting Orientation: : Oriented to Self, Oriented to Place, Oriented to  Time, Oriented to Situation Alcohol / Substance Use: Not Applicable Psych Involvement: No (comment)  Admission diagnosis:  Peripheral edema [R60.9] Acute midline low back pain without sciatica [M54.5] Stage 4 chronic kidney disease (HCC) [N18.4] Acute on chronic congestive heart failure, unspecified heart failure type [facility]) [I50.9] Patient Active Problem List   Diagnosis Date Noted  . Acute on chronic respiratory failure with hypoxemia ([place]) 07/28/2019  . Acute renal failure (ARF) ([place]) 07/12/2019  . Acute renal failure superimposed on stage 4 chronic kidney disease ([place]) 07/10/2019  . NSVT (nonsustained ventricular tachycardia) ([place]) 11/22/2018  . Chronic systolic heart failure ([place]) 10/12/2018  . HTN (hypertension) 10/12/2018  . Preop cardiovascular exam 12/10/2016  . Hypertensive heart disease   . Ischemic cardiomyopathy   . Hypercholesterolemia   . CKD (chronic  kidney disease), stage III ([place])   . Hyperlipidemia 07/31/2014  . Orthostatic hypotension 06/15/2014  . Chronic kidney disease 05/21/2014  . Chronic osteomyelitis of toe of left foot ([place]) 05/21/2014  . Diabetic ulcer of left foot ([place]) 05/21/2014  . Physical deconditioning 05/15/2014  . S/P CABG x 4 05/13/2014  . Diabetes mellitus type 2 with complications ([place]) 45/91/3685  . CAD (coronary artery disease)    PCP:  Tracie Harrier, MD Pharmacy:   [facility], [place]  [address] Phone: [phone] Fax: [phone]  [facility] [phone] [address], [place] [address] Phone: [phone] Fax: [phone]     Social Determinants of Health (SDOH) Interventions    Readmission Risk Interventions No flowsheet data found.

## 2019-07-28 NOTE — Progress Notes (Addendum)
[place] [facility] [place] NAME: [name] [name]    MR#:  [id number]  DATE OF BIRTH:  09-11-1961  SUBJECTIVE:  CHIEF COMPLAINT: Patient is complaining of back pain for the past 1 week Denies any problem with micturition on evacuation.  Wife at bedside Requesting cardiology regarding shortness of breath and nephrology consult Patient denies any chest pain or shortness of breath during my examination  REVIEW OF SYSTEMS:  CONSTITUTIONAL: No fever, fatigue or weakness.  EYES: No blurred or double vision.  EARS, NOSE, AND THROAT: No tinnitus or ear pain.  RESPIRATORY: No cough, shortness of breath, wheezing or hemoptysis.  CARDIOVASCULAR: No chest pain, orthopnea, edema.  GASTROINTESTINAL: No nausea, vomiting, diarrhea or abdominal pain.  GENITOURINARY: No dysuria, hematuria.  ENDOCRINE: No polyuria, nocturia,  HEMATOLOGY: No anemia, easy bruising or bleeding SKIN: No rash or lesion. MUSCULOSKELETAL: 1 week history of back pain NEUROLOGIC: No tingling, numbness, weakness.  PSYCHIATRY: No anxiety or depression.   DRUG ALLERGIES:   Allergies  Allergen Reactions  . Other Other (See Comments)    Pt. And wife state that he had a reaction to "some" antibiotic but they do not know what the name of it was.They are are very apprehensive about him receiving any antibiotic. Zosyn, Clindamycin, & Vancomycin (all started together unsure which caused reaction 07/18/2013)    VITALS:  Blood pressure 133/70, pulse 80, temperature 98.5 F (36.9 C), temperature source Oral, resp. rate 20, height 6\' 1"  (1.854 m), weight 124.4 kg, SpO2 94 %.  PHYSICAL EXAMINATION:  GENERAL:  58 y.o.-year-old patient lying in the bed with no acute distress.  EYES: Pupils equal, round, reactive to light and accommodation. No scleral icterus. Extraocular muscles intact.  HEENT: Head atraumatic, normocephalic. Oropharynx and nasopharynx clear.  NECK:  Supple, no jugular venous  distention. No thyroid enlargement, no tenderness.  LUNGS: Normal breath sounds bilaterally, no wheezing, rales,rhonchi or crepitation. No use of accessory muscles of respiration.  CARDIOVASCULAR: S1, S2 normal. No murmurs, rubs, or gallops.  ABDOMEN: Soft, nontender, nondistended. Bowel sounds present EXTREMITIES: No pedal edema, cyanosis, or clubbing.  NEUROLOGIC: Cranial nerves II through XII are intact.  Sensation intact. Gait not checked.  Left paravertebral tenderness PSYCHIATRIC: The patient is alert and oriented x 3.  SKIN: No obvious rash, lesion, or ulcer.    LABORATORY PANEL:   CBC Recent Labs  Lab 07/27/19 1711  WBC 14.5*  HGB 8.0*  HCT 25.0*  PLT 309   ------------------------------------------------------------------------------------------------------------------  Chemistries  Recent Labs  Lab 07/27/19 1711 07/28/19 0303 07/28/19 0547  NA 134*  --   --   K 4.7  --   --   CL 106  --   --   CO2 20*  --   --   GLUCOSE 344*  --   --   BUN 69*  --   --   CREATININE 3.55*  --  3.39*  CALCIUM 8.3*  --   --   MG  --  2.3  --    ------------------------------------------------------------------------------------------------------------------  Cardiac Enzymes No results for input(s): TROPONINI in the last 168 hours. ------------------------------------------------------------------------------------------------------------------  RADIOLOGY:  Dg Chest 2 View  Result Date: 07/27/2019 CLINICAL DATA:  Shortness of breath EXAM: CHEST - 2 VIEW COMPARISON:  10/19/2018 FINDINGS: Cardiomegaly status post median sternotomy. Pulmonary vascular prominence without overt edema or other airspace opacity. The visualized skeletal structures are unremarkable. IMPRESSION: Cardiomegaly. Pulmonary vascular prominence without overt edema or other airspace opacity. Electronically Signed  By: Eddie Candle M.D.   On: 07/27/2019 17:48   Ct Lumbar Spine Wo Contrast  Result Date:  07/27/2019 CLINICAL DATA:  Back pain EXAM: CT LUMBAR SPINE WITHOUT CONTRAST TECHNIQUE: Multidetector CT imaging of the lumbar spine was performed without intravenous contrast administration. Multiplanar CT image reconstructions were also generated. COMPARISON:  July 11, 2019 FINDINGS: Segmentation: There are 5 non-rib bearing lumbar type vertebral bodies with the last intervertebral disc space labeled as L5-S1. Alignment: Normal Vertebrae: The vertebral body heights are well maintained. No fracture, malalignment, or pathologic osseous lesions seen. Paraspinal and other soft tissues: There is new soft tissue edema and focal heterogeneous soft tissue density seen overlying L4-L5 . Surrounding subcutaneous edema and skin thickening is seen within this region. The sacroiliac joints appear to be intact. Scattered aortic atherosclerosis is noted. Disc levels: Mild disc height loss and facet arthrosis most notable at L4-L5 and L5-S1. IMPRESSION: There is new significant soft tissue edema and heterogeneous soft tissue collection seen at L4-L5 in the posterior paraspinal soft tissues which could represent early phlegmon versus soft tissue hematoma given the patient's history. If further evaluation is required would recommend MRI with contrast. These results were called by telephone at the time of interpretation on 07/27/2019 at 11:29 pm to provider PHILLIP STAFFORD , who verbally acknowledged these results. Electronically Signed   By: Prudencio Pair M.D.   On: 07/27/2019 23:30   Mr Lumbar Spine Wo Contrast  Result Date: 07/28/2019 CLINICAL DATA:  58 year old male with back pain. Abnormal subcutaneous lumbar paraspinal soft tissues on CT yesterday. EXAM: MRI LUMBAR SPINE WITHOUT CONTRAST TECHNIQUE: Multiplanar, multisequence MR imaging of the lumbar spine was performed. No intravenous contrast was administered. COMPARISON:  Lumbar spine CT 07/27/2019. FINDINGS: The patient was scanned in the lateral decubitus position due  to pain, and subsequently the sagittal images are somewhat oblique. Segmentation:  Normal on the CT yesterday. Alignment:  Straightening of lumbar lordosis.  No spondylolisthesis. Vertebrae: No marrow edema or evidence of acute osseous abnormality. Visualized bone marrow signal is within normal limits. Intact visible sacrum and SI joints. Conus medullaris and cauda equina: Conus extends to the L1 level. No lower spinal cord or conus signal abnormality. Paraspinal and other soft tissues: Negative visible abdominal viscera. Large body habitus with abundant lumbar posterior subcutaneous edema. Overlying the L4 level to the right of midline there is a 7 centimeter area of confluent abnormal signal in the subcutaneous fat (series 9, image 17 and series 13, image 28). With associated inflammation in the underlying superficial aspect of the right lumbar rectus spine I muscles from L2-L3 inferiorly (series 14, images 20 through 28). No contrast was administered but there is no drainable fluid identified. No underlying bony changes are evident. Disc levels: T12-L1:  Negative. L1-L2:  Mild facet hypertrophy. L2-L3:  Mild facet hypertrophy. L3-L4:  Mild far lateral disc bulging.  No definite stenosis. L4-L5: Mild circumferential disc bulge. No spinal or lateral recess stenosis. Mild bilateral L4 foraminal stenosis probably greater on the left. L5-S1:  Negative disc.  Mild facet hypertrophy.  No stenosis. IMPRESSION: 1. Large round 7 centimeter nonspecific soft tissue lesion in the subcutaneous fat overlying the L4 vertebra to the right of midline. This could be an infectious phlegmon or a hematoma. There is underlying edema within the superficial aspect of the right erector spinae muscle from L2 to the sacrum, but no drainable fluid collection. There is abundant generalized subcutaneous edema throughout the lumbar spine. 2. No underlying acute osseous abnormality  in the spine. 3. Relatively mild for age lumbar degeneration  with no spinal stenosis and only mild bilateral L4 foraminal stenosis. Electronically Signed   By: Genevie Ann M.D.   On: 07/28/2019 16:21    EKG:   Orders placed or performed during the hospital encounter of 07/27/19  . EKG 12-Lead  . EKG 12-Lead  . ED EKG  . ED EKG    ASSESSMENT AND PLAN:   1.AKI: Superimposed upon chronic kidney injury.  Result of increased diuretic therapy.  Avoid nephrotoxic agents.  Consult nephrology discussed with Dr. Juleen China Baseline creatinine 2.45 currently he is at 3.39 2.  Acute  Low back pain: Questionable area of infection/phlegmon and/or hematoma in lumbar vertebrae.  Patient with associated leukocytosis concerning for abscess formation.  Continue broad-spectrum antibiotics. MRI of the lumbar spine with questionable hematoma versus infection  Discussed with neurosurgery Dr. Lamarr Lulas surgical interventions needed  Ok to work with PT 3.   Respiratory failure: Acute on chronic; with hypoxia.  No pulmonary edema although the patient has some vascular congestion and may have had some transient pulmonary edema relieved by Lasix over the last few days.  Appearance of chest x-ray also consistent with hyperinflation seen and COPD but the patient does not carry this diagnosis.  Oxygen saturations are normal on room air.  Monitor respiratory status  4 Elevated troponin: Likely secondary to demand ischemia.  Continue to follow cardiac biomarkers.  Monitor telemetry.  Cardiology consult placed to Dr. Rockey Situ 5.  DVT prophylaxis: Discontinue Heparin as there is a questionable hematoma in the L4 vertebra Place SCDs 6.  GI prophylaxis: None     All the records are reviewed and case discussed with Care Management/Social Workerr. Management plans discussed with the patient, wife at bedside and they are in agreement.  CODE STATUS: fc  TOTAL TIME TAKING CARE OF THIS PATIENT: 36  minutes.   POSSIBLE D/C IN 2 DAYS, DEPENDING ON CLINICAL CONDITION.  Note: This dictation was  prepared with Dragon dictation along with smaller phrase technology. Any transcriptional errors that result from this process are unintentional.   Nicholes Mango M.D on 07/28/2019 at 4:46 PM  Between 7am to 6pm - Pager - [phone] After 6pm go to [email or web address] - password [email or web address] [facility]  Office  [phone]  CC: Primary care physician; Tracie Harrier, MD

## 2019-07-28 NOTE — Consult Note (Signed)
Cardiology Consultation:   Patient ID: [name] [name] MRN: [id number]; DOB: 02-11-1961  Admit date: 07/27/2019 Date of Consult: 07/28/2019  Primary Care Provider: Tracie Harrier, MD Primary Cardiologist: Ida Rogue, MD  Primary Electrophysiologist:  Virl Axe, MD    Patient Profile:   [name] [name] is a 58 y.o. male with a hx of CAD s/p four-vessel CABG (04/2014), HFrEF secondary to ICM, NSVT, paroxysmal SVT, PVD, poorly controlled insulin-dependent diabetes with diabetic foot ulcers and diabetic peripheral neuropathy requiring amputations of the left toes, osteomyelitis, CKD stage IV, hypertension, hyperlipidemia, asymptomatic orthostatic hypotension, medication compliance issues secondary to financial issues, and who is being seen today for the evaluation of shortness of breath at the request of Dr. Margaretmary Eddy.  History of Present Illness:   [name] [name] is a 58 year old male with PMH as above and including 4v CABG and recommendation for ICD by EP.    --07/2013: Echo  EF of 20%.  Stress testing was undertaken at that time and ruled of moderate risk scan.    ---03/2014 LHC showed 40% stenosis in the mid left main, 70% stenosis in the mid LAD, 70% stenosis in D2, 80% proximal left circumflex stenosis, 80% mid left circumflex stenosis, 99% proximal RCA stenosis, R PLA 99% stenosis.  He underwent 4-vessel CABG as outlined in patient profile above.  Follow-up echo EF 40 to 45%.     --09/2018, [facility] admission and underwent 97XY diuresis, complicated by chronic renal failure in the setting of dietary noncompliance. Stress test at that time was ruled a moderate risk scan with similar findings to that of 02/2018.  Outpatient cardiac monitoring performed after discharge confirmed short run of NSVT.    --01/2019: EP referral was advised for consideration of ICD, given his left ventricular dysfunction and prior MI, bypass surgery, and NSVT.  He was evaluated 01/2019 by EP and declined ICD.  06/2019 echo  showed a persistent cardiomyopathy with EF 30 to 35% and diffuse left ventricular hypokinesis.    --07/06/2019: Seen in clinic with weight down 24 pounds since his last visit.  He had not needed Lasix in at least 6 months, as well as reduced his hydralazine and Imdur.  His home BP was labile with SBP into 160s. Per PCP, he was restarted on losartan with follow-up renal function showing stable creatinine and potassium. He did note dizziness and fatigue.  Since that time, he has reportedly been admitted to [facility] 07/10/2023 dehydration due to acute diarrhea, thought to be infectious in nature.  He reported that after this admission, he continued to feel dizzy/presyncope symptoms and almost passed out while at a grocery store immediately after discharge.  He continued to feel short of breath at baseline with acute increasing shortness of breath approximately 2 days ago (07/26/2019) that caused him to present to [facility] ED.  He reported associated fatigue, which was ongoing.  He also had noted increased bilateral lower extremity edema. No chest pain, racing HR, or palpitations.  Of note, due to back pain, he had reportedly spent almost 2 weeks (or the entire time since his last admission in bed and on painkillers. He reported reduced appetite.    In the ED, SBP 168.  Creatinine 3.55, BUN 69, WBC 14.5, hemoglobin 8.0, high-sensitivity troponin 32  55. EKG NSR without acute ST/T changes from previous.  Chest x-ray showed pulmonary vascular prominence without overt edema or other airspace opacity.  CT showed early phlegmon versus hematoma in lumbar area with MRI pending.   Heart Pathway Score:  Past Medical History:  Diagnosis Date   CAD (coronary artery disease)    a. 04/2014 CABG x 4 (LIMA->LAD, VG->Diag, VG->OM, VG->PDA).   Chronic systolic CHF (congestive heart failure) ([place])    a. 07/2014 Echo: EF 30-35%.   CKD (chronic kidney disease), stage III ([place])    a. 12/2015 Creat 1.8.   Diabetic foot ulcers ([place])     a. left foot 2nd digit ant 5 th digit 05/03/14; b. 08/2014 s/p amputation of toes on L foot.   Hypercholesterolemia    a. 12/2015 TC 116, TG 154, HDL 24, LDL 61-->Atorvastatin 40.   Hypertension    Hypertensive heart disease    Ischemic cardiomyopathy    a. 07/2013 Echo: EF 20%; b. 07/2014 Echo: EF 30-35%, diff HK, Gr1 DD, mildly dil LA, nl PASP.   Myocardial infarction ([place])    Neuropathy in diabetes [facility])    Open wound    foot   Orthostatic hypotension    Peripheral vascular disease ([place])    Type II diabetes mellitus ([place])    a. 12/2015 HbA1c = 13.9.    Past Surgical History:  Procedure Laterality Date   ACHILLES TENDON SURGERY Right 01/22/2017   Procedure: ACHILLES LENGTHENING/KIDNER/TAL/Teno achilles lengthening;  Surgeon: Samara Deist, DPM;  Location: [facility] ORS;  Service: Podiatry;  Laterality: Right;   CARDIAC CATHETERIZATION     03/2014   CATARACT EXTRACTION     CORONARY ARTERY BYPASS GRAFT N/A 05/07/2014   Procedure: CORONARY ARTERY BYPASS GRAFTING (CABG);  Surgeon: Gaye Pollack, MD;  Location: [place];  Service: Open Heart Surgery;  Laterality: N/A;  Times 4 using left internal mammary artery and endoscopically harvested right saphenous vein   INTRAOPERATIVE TRANSESOPHAGEAL ECHOCARDIOGRAM N/A 05/07/2014   Procedure: INTRAOPERATIVE TRANSESOPHAGEAL ECHOCARDIOGRAM;  Surgeon: Gaye Pollack, MD;  Location: [place] OR;  Service: Open Heart Surgery;  Laterality: N/A;   left foot osteomyelitis and wound after surgery     TOE AMPUTATION     Left 3 and 4 toes   TONSILECTOMY/ADENOIDECTOMY WITH MYRINGOTOMY       Home Medications:  Prior to Admission medications   Medication Sig Start Date End Date Taking? Authorizing Provider  aspirin EC 81 MG tablet Take 81 mg by mouth daily.   Yes [provider]  atorvastatin (LIPITOR) 40 MG tablet Take 1 tablet (40 mg total) by mouth at bedtime. 11/23/18  Yes Gollan, Kathlene November, MD  carvedilol (COREG) 25 MG tablet Take 1 tablet  (25 mg total) by mouth 2 (two) times daily with a meal. Patient taking differently: Take 25 mg by mouth at bedtime.  11/23/18  Yes Gollan, Kathlene November, MD  Cyanocobalamin (VITAMIN B 12 PO) Take 1 tablet by mouth at bedtime.    Yes [provider]  famotidine (PEPCID) 20 MG tablet TAKE 1 TABLET BY MOUTH EVERY DAY Patient taking differently: Take 20 mg by mouth at bedtime.  07/18/19  Yes Tahiliani, Margretta Sidle B, MD  ferrous sulfate 325 (65 FE) MG tablet Take 325 mg by mouth daily.   Yes [provider]  glimepiride (AMARYL) 4 MG tablet Take 4 mg by mouth 2 (two) times daily with a meal.  11/20/16  Yes [provider]  hydrALAZINE (APRESOLINE) 25 MG tablet Take 1 tablet (25 mg total) by mouth 2 (two) times daily. 07/06/19  Yes Dunn, Areta Haber, PA-C  isosorbide mononitrate (IMDUR) 60 MG 24 hr tablet Take 0.5 tablets (30 mg total) by mouth daily. 07/06/19  Yes Rise Mu, PA-C  losartan (COZAAR) 25 MG tablet Take 25 mg by mouth at bedtime.  05/30/19 05/29/20 Yes [provider]  nitroGLYCERIN (NITROSTAT) 0.4 MG SL tablet Place 1 tablet (0.4 mg total) under the tongue every 5 (five) minutes as needed for chest pain. 12/24/15  Yes Theora Gianotti, NP  sodium bicarbonate 650 MG tablet Take 1,300 mg by mouth 2 (two) times daily.  03/01/19  Yes [provider]  HYDROcodone-acetaminophen (NORCO/VICODIN) 5-325 MG tablet Take by mouth as needed. 05/30/19   [provider]  polyethylene glycol (MIRALAX) 17 g packet Take 17 g by mouth daily. Patient not taking: Reported on 07/27/2019 07/18/19   Virgel Manifold, MD    Inpatient Medications: Scheduled Meds:  aspirin EC  81 mg Oral Daily   atorvastatin  40 mg Oral QHS   carvedilol  25 mg Oral BID WC   docusate sodium  100 mg Oral BID   famotidine  20 mg Oral QHS   ferrous sulfate  325 mg Oral Daily   heparin  5,000 Units Subcutaneous Q8H   hydrALAZINE  25 mg Oral BID   insulin aspart  0-5 Units  Subcutaneous QHS   insulin aspart  0-9 Units Subcutaneous TID WC   isosorbide mononitrate  30 mg Oral Daily   polyethylene glycol  17 g Oral Daily   sodium bicarbonate  1,300 mg Oral BID   Continuous Infusions:  cefTRIAXone (ROCEPHIN)  IV     [START ON 07/29/2019] vancomycin     PRN Meds: acetaminophen **OR** acetaminophen, nitroGLYCERIN, ondansetron **OR** ondansetron (ZOFRAN) IV, oxyCODONE  Allergies:    Allergies  Allergen Reactions   Other Other (See Comments)    Pt. And wife state that he had a reaction to "some" antibiotic but they do not know what the name of it was.They are are very apprehensive about him receiving any antibiotic. Zosyn, Clindamycin, & Vancomycin (all started together unsure which caused reaction 07/18/2013)    Social History:   Social History   Socioeconomic History   Marital status: Married    Spouse name: Not on file   Number of children: Not on file   Years of education: Not on file   Highest education level: Not on file  Occupational History   Not on file  Social Needs   Financial resource strain: Not on file   Food insecurity    Worry: Not on file    Inability: Not on file   Transportation needs    Medical: Not on file    Non-medical: Not on file  Tobacco Use   Smoking status: Never Smoker   Smokeless tobacco: Never Used  Substance and Sexual Activity   Alcohol use: No   Drug use: No   Sexual activity: Not on file  Lifestyle   Physical activity    Days per week: Not on file    Minutes per session: Not on file   Stress: Not on file  Relationships   Social connections    Talks on phone: Not on file    Gets together: Not on file    Attends religious service: Not on file    Active member of club or organization: Not on file    Attends meetings of clubs or organizations: Not on file    Relationship status: Not on file   Intimate partner violence    Fear of current or ex partner: Not on file    Emotionally  abused: Not on file    Physically abused: Not  on file    Forced sexual activity: Not on file  Other Topics Concern   Not on file  Social History Narrative   Not on file    Family History:    Family History  Problem Relation Age of Onset   Heart disease Father        CABG in his 70's   Diabetes type II Other    Kidney disease Other    Hypertension Other    Ovarian cancer Mother      ROS:  Please see the history of present illness.  Review of Systems  Constitutional: Positive for malaise/fatigue and weight loss.  Respiratory: Negative for hemoptysis.   Cardiovascular: Positive for orthopnea and leg swelling. Negative for chest pain and palpitations.  Gastrointestinal: Negative for abdominal pain, blood in stool, melena, nausea and vomiting.  Genitourinary: Negative for hematuria.  Musculoskeletal: Positive for back pain. Negative for falls.  Neurological: Positive for dizziness. Negative for loss of consciousness.  All other systems reviewed and are negative.   All other ROS reviewed and negative.     Physical Exam/Data:   Vitals:   07/28/19 0005 07/28/19 0156 07/28/19 0246 07/28/19 0914  BP: 139/71 135/63 (!) 167/87 (!) 120/46  Pulse: 88 91 97 81  Resp: 17 14 18    Temp:   98.7 F (37.1 C)   TempSrc:   Oral   SpO2: 96% 93% 95% 94%  Weight:   124.4 kg   Height:   6\' 1"  (1.854 m)     Intake/Output Summary (Last 24 hours) at 07/28/2019 1301 Last data filed at 07/28/2019 1021 Gross per 24 hour  Intake 779.76 ml  Output 1025 ml  Net -245.24 ml   Last 3 Weights 07/28/2019 07/27/2019 07/10/2019  Weight (lbs) 274 lb 3.2 oz 275 lb 259 lb  Weight (kg) 124.376 kg 124.739 kg 117.482 kg     Body mass index is 36.18 kg/m.  General:  Obese male in clear pain (holding his back), lying on his side. Joined by wife HEENT: normal Neck: JVD difficult to assess due to body habitus and patient position/pain level Vascular: No carotid bruits; radial pulses 2+  bilaterally Cardiac:  normal S1, S2; RRR + S3  Lungs:  clear to auscultation bilaterally, no wheezing, rhonchi or rales  Abd: soft, nontender, no hepatomegaly  Ext: 2-3+ bilateral LEE Musculoskeletal:  No deformities, BUE and BLE strength normal and equal Skin: warm and dry  Neuro:  No focal abnormalities noted Psych:  Normal affect   EKG:  The EKG was personally reviewed and demonstrates:  78 bpm, NSR, prior inferior and anterior septal MI possible, repolarization changes - no acute change from previous EKG  Telemetry:  Telemetry was personally reviewed and demonstrates:  SR 80s-80s  Relevant CV Studies: 2D Echo 06/30/2019: 1. The left ventricle has moderate-severely reduced systolic function, with an ejection fraction of 30-35%. The cavity size was normal. There is mildly increased left ventricular wall thickness. Left ventricular diastolic Doppler parameters are  consistent with pseudonormalization. Left ventricular diffuse hypokinesis. 2. The right ventricle has normal systolic function. The cavity was normal. There is no increase in right ventricular wall thickness. Unable to estimate RVSP. 3. Left atrial size was moderately dilated. __________  [name] [name] 10/2018: Normal sinus rhythm avg HR of 81 bpm.  8 Ventricular Tachycardia runs occurred, the run with the fastest interval lasting 4 beats with a max rate of 200 bpm,  the longest lasting 17 beats with an avg rate of 120 bpm.  7 Supraventricular Tachycardia runs occurred,  the run with the fastest interval lasting 5 beats with a max rate of 148 bpm,  the longest lasting 13 beats with an avg rate of 110 bpm.   Isolated SVEs were rare (<1.0%), SVE Couplets were rare (<1.0%), and SVE Triplets were rare (<1.0%).  Isolated VEs were rare (<1.0%), VE Couplets were rare (<1.0%), and no VE Triplets were present. Ventricular Trigeminy was present. __________  Myoview 09/2018: Pharmacological myocardial perfusion imaging study with  large region of fixed inferior and inferolateral wall perfusion defect consistent with previous MI Very mild ischemia in the inferolateral wall (peri-infarct ischemia) Hypokinesis noted in the inferior wall. EF estimated at 36% No EKG changes concerning for ischemia at peak stress or in recovery. Moderate risk scan Similar findings on the study noted in 02/2018 and several years ago.  Laboratory Data:  High Sensitivity Troponin:   Recent Labs  Lab 07/27/19 1711 07/27/19 1909 07/28/19 0303 07/28/19 0547  TROPONINIHS 32* 55* 35* 31*     Cardiac EnzymesNo results for input(s): TROPONINI in the last 168 hours. No results for input(s): TROPIPOC in the last 168 hours.  Chemistry Recent Labs  Lab 07/27/19 1711 07/28/19 0547  NA 134*  --   K 4.7  --   CL 106  --   CO2 20*  --   GLUCOSE 344*  --   BUN 69*  --   CREATININE 3.55* 3.39*  CALCIUM 8.3*  --   GFRNONAA 18* 19*  GFRAA 21* 22*  ANIONGAP 8  --     No results for input(s): PROT, ALBUMIN, AST, ALT, ALKPHOS, BILITOT in the last 168 hours. Hematology Recent Labs  Lab 07/27/19 1711  WBC 14.5*  RBC 2.71*  HGB 8.0*  HCT 25.0*  MCV 92.3  MCH 29.5  MCHC 32.0  RDW 13.1  PLT 309   BNPNo results for input(s): BNP, PROBNP in the last 168 hours.  DDimer No results for input(s): DDIMER in the last 168 hours.   Radiology/Studies:  Dg Chest 2 View  Result Date: 07/27/2019 CLINICAL DATA:  Shortness of breath EXAM: CHEST - 2 VIEW COMPARISON:  10/19/2018 FINDINGS: Cardiomegaly status post median sternotomy. Pulmonary vascular prominence without overt edema or other airspace opacity. The visualized skeletal structures are unremarkable. IMPRESSION: Cardiomegaly. Pulmonary vascular prominence without overt edema or other airspace opacity. Electronically Signed   By: Eddie Candle M.D.   On: 07/27/2019 17:48   Ct Lumbar Spine Wo Contrast  Result Date: 07/27/2019 CLINICAL DATA:  Back pain EXAM: CT LUMBAR SPINE WITHOUT CONTRAST  TECHNIQUE: Multidetector CT imaging of the lumbar spine was performed without intravenous contrast administration. Multiplanar CT image reconstructions were also generated. COMPARISON:  July 11, 2019 FINDINGS: Segmentation: There are 5 non-rib bearing lumbar type vertebral bodies with the last intervertebral disc space labeled as L5-S1. Alignment: Normal Vertebrae: The vertebral body heights are well maintained. No fracture, malalignment, or pathologic osseous lesions seen. Paraspinal and other soft tissues: There is new soft tissue edema and focal heterogeneous soft tissue density seen overlying L4-L5 . Surrounding subcutaneous edema and skin thickening is seen within this region. The sacroiliac joints appear to be intact. Scattered aortic atherosclerosis is noted. Disc levels: Mild disc height loss and facet arthrosis most notable at L4-L5 and L5-S1. IMPRESSION: There is new significant soft tissue edema and heterogeneous soft tissue collection seen at L4-L5 in the posterior paraspinal soft tissues which could represent early phlegmon versus soft tissue hematoma given the patient's history. If  further evaluation is required would recommend MRI with contrast. These results were called by telephone at the time of interpretation on 07/27/2019 at 11:29 pm to provider PHILLIP STAFFORD , who verbally acknowledged these results. Electronically Signed   By: Prudencio Pair M.D.   On: 07/27/2019 23:30    Assessment and Plan:   Chronic HFrEF 2/2 ICM  --Likely cardiorenal with worsening renal function and in the setting of uncontrolled DM2. Consider edema as 2/2 severe anemia (Hgb 8.0) and hypoalbuminemia. Consider also SOB 2/2 worsening anemia. --Echo as above with EF 30-35%. Of note, previously refused ICD but wanted to document that he is now agreeable to ICD.  --Diuresis has been complicated by renal function in the past, as well as medical noncompliance.  --Continue IV diuresis for now. BMET to monitor renal  function and electrolytes with nephrology already following this admission for current renal function.   CAD s/p 4v CABG --No current CP. Extensive cardiac history as above. Enzymes peaked at 55. EKG as above and without acute changes. Continue PTA medical management.  HTN with hypertensive heart disease --Continue PTA medical management.  HLD --Continue statin therapy.  CKDIV --Likely cardiorenal syndrome and worsening renal function in setting of poorly controlled DM2. Daily BMET. Cr 3.55  3.39. Nephrology following.  Hyperkalemia --K 4.7. Continue to monitor. Check Mg.  Anemia of chronic dz --Consider as contributing to SOB. Hgb has slowly been dropping over the last few months and currently Hgb 8.0. Daily CBC. Recommendation for transfusion below 8.0 and per IM.   DM2, poorly controlled --A1C now 6.9%, previously much higher at 12.2. --SSI, per IM.  PVD --L toes amputation.   H/o NSVT --Continue to monitor telemetry. --TSH 3.647. --Continue to monitor electrolytes.  Medication non-compliance --Compliance stressed.  For questions or updates, please contact [facility] Please consult [email or web address] for contact info under     Signed, Arvil Chaco, PA-C  07/28/2019 1:01 PM

## 2019-07-28 NOTE — Consult Note (Addendum)
Pharmacy Antibiotic Note  [name] [name] is a 58 y.o. male admitted on 07/27/2019 with possible spinal infection.  Pharmacy has been consulted for Vancomycin dosing. Patient with CKD 3 and apparent baseline Scr ~ 2.5-3 based on historical labs.    Plan: Patient received Vancomycin 2g IV x 1 in the ED  Will follow with: Vancomycin 1750 mg IV Q 48 hrs. Goal AUC 400-550. Expected AUC: 516 SCr used: 3.39 Est Cmin 11.0  Will recheck Scr and possibly modify dose as patient is currently with elevate Scr.  Height: 6\' 1"  (185.4 cm) Weight: 274 lb 3.2 oz (124.4 kg) IBW/kg (Calculated) : 79.9  Temp (24hrs), Avg:98.8 F (37.1 C), Min:98.7 F (37.1 C), Max:98.8 F (37.1 C)  Recent Labs  Lab 07/27/19 1711  WBC 14.5*  CREATININE 3.55*    Estimated Creatinine Clearance: 32.1 mL/min (A) (by C-G formula based on SCr of 3.55 mg/dL (H)).    Allergies  Allergen Reactions  . Other Other (See Comments)    Pt. And wife state that he had a reaction to "some" antibiotic but they do not know what the name of it was.They are are very apprehensive about him receiving any antibiotic. Zosyn, Clindamycin, & Vancomycin (all started together unsure which caused reaction 07/18/2013)    Antimicrobials this admission: Ceftriaxone 9/11 >> Vancomycin 9/11 >>   Dose adjustments this admission: None  Microbiology results: None at this time  Thank you for allowing pharmacy to be a part of this patient's care.  Lu Duffel, PharmD, BCPS Clinical Pharmacist 07/28/2019 7:29 AM

## 2019-07-28 NOTE — ED Notes (Signed)
ED TO INPATIENT HANDOFF REPORT  ED Nurse Name and Phone #: Butch [phone]  S Name/Age/Gender [name] [name] 58 y.o. male Room/Bed: ED09A/ED09A  Code Status   Code Status: Prior  Home/SNF/Other Home Patient oriented to: self, place, time and situation Is this baseline? Yes   Triage Complete: Triage complete  Chief Complaint Back pain/CP/Shob  Triage Note Pt in via POV, reports ongoing shortness of breath and mid-lower back pain x approximately 2 weeks.  Vitals WDL, NAD noted at this time.   Allergies Allergies  Allergen Reactions  . Other Other (See Comments)    Pt. And wife state that he had a reaction to "some" antibiotic but they do not know what the name of it was.They are are very apprehensive about him receiving any antibiotic. Zosyn, Clindamycin, & Vancomycin (all started together unsure which caused reaction 07/18/2013)    Level of Care/Admitting Diagnosis ED Disposition    ED Disposition Condition [facility] Area: [place] [[id number]]  Level of Care: Telemetry [5]  Covid Evaluation: Confirmed COVID Negative  Diagnosis: Acute on chronic respiratory failure with hypoxemia [facility]) [[id number]]  Admitting Physician: Harrie Foreman [[id number]]  Attending Physician: Harrie Foreman [[id number]]  Estimated length of stay: past midnight tomorrow  Certification:: I certify this patient will need inpatient services for at least 2 midnights  PT Class (Do Not Modify): Inpatient [101]  PT Acc Code (Do Not Modify): Private [1]       B Medical/Surgery History Past Medical History:  Diagnosis Date  . CAD (coronary artery disease)    a. 04/2014 CABG x 4 (LIMA->LAD, VG->Diag, VG->OM, VG->PDA).  . Chronic systolic CHF (congestive heart failure) ([place])    a. 07/2014 Echo: EF 30-35%.  . CKD (chronic kidney disease), stage III ([place])    a. 12/2015 Creat 1.8.  . Diabetic foot ulcers ([place])    a. left foot 2nd digit ant 5 th digit 05/03/14; b.  08/2014 s/p amputation of toes on L foot.  . Hypercholesterolemia    a. 12/2015 TC 116, TG 154, HDL 24, LDL 61-->Atorvastatin 40.  Marland Kitchen Hypertension   . Hypertensive heart disease   . Ischemic cardiomyopathy    a. 07/2013 Echo: EF 20%; b. 07/2014 Echo: EF 30-35%, diff HK, Gr1 DD, mildly dil LA, nl PASP.  Marland Kitchen Myocardial infarction ([place])   . Neuropathy in diabetes ([place])   . Open wound    foot  . Orthostatic hypotension   . Peripheral vascular disease ([place])   . Type II diabetes mellitus ([place])    a. 12/2015 HbA1c = 13.9.   Past Surgical History:  Procedure Laterality Date  . ACHILLES TENDON SURGERY Right 01/22/2017   Procedure: ACHILLES LENGTHENING/KIDNER/TAL/Teno achilles lengthening;  Surgeon: Samara Deist, DPM;  Location: [facility] ORS;  Service: Podiatry;  Laterality: Right;  . CARDIAC CATHETERIZATION     03/2014  . CATARACT EXTRACTION    . CORONARY ARTERY BYPASS GRAFT N/A 05/07/2014   Procedure: CORONARY ARTERY BYPASS GRAFTING (CABG);  Surgeon: Gaye Pollack, MD;  Location: [place];  Service: Open Heart Surgery;  Laterality: N/A;  Times 4 using left internal mammary artery and endoscopically harvested right saphenous vein  . INTRAOPERATIVE TRANSESOPHAGEAL ECHOCARDIOGRAM N/A 05/07/2014   Procedure: INTRAOPERATIVE TRANSESOPHAGEAL ECHOCARDIOGRAM;  Surgeon: Gaye Pollack, MD;  Location: [facility] OR;  Service: Open Heart Surgery;  Laterality: N/A;  . left foot osteomyelitis and wound after surgery    . TOE AMPUTATION     Left 3  and 4 toes  . TONSILECTOMY/ADENOIDECTOMY WITH MYRINGOTOMY       A IV Location/Drains/Wounds Patient Lines/Drains/Airways Status   Active Line/Drains/Airways    Name:   Placement date:   Placement time:   Site:   Days:   Peripheral IV 07/27/19 Right Forearm   07/27/19    2229    Forearm   1          Intake/Output Last 24 hours  Intake/Output Summary (Last 24 hours) at 07/28/2019 0155 Last data filed at 07/28/2019 0043 Gross per 24 hour  Intake 100 ml  Output 525 ml  Net  -425 ml    Labs/Imaging Results for orders placed or performed during the hospital encounter of 07/27/19 (from the past 48 hour(s))  Basic metabolic panel     Status: Abnormal   Collection Time: 07/27/19  5:11 PM  Result Value Ref Range   Sodium 134 (L) 135 - 145 mmol/L   Potassium 4.7 3.5 - 5.1 mmol/L   Chloride 106 98 - 111 mmol/L   CO2 20 (L) 22 - 32 mmol/L   Glucose, Bld 344 (H) 70 - 99 mg/dL   BUN 69 (H) 6 - 20 mg/dL   Creatinine, Ser 3.55 (H) 0.61 - 1.24 mg/dL   Calcium 8.3 (L) 8.9 - 10.3 mg/dL   GFR calc non Af Amer 18 (L) >60 mL/min   GFR calc Af Amer 21 (L) >60 mL/min   Anion gap 8 5 - 15    Comment: Performed at [facility], [address] [place], [place] [address]  CBC     Status: Abnormal   Collection Time: 07/27/19  5:11 PM  Result Value Ref Range   WBC 14.5 (H) 4.0 - 10.5 K/uL   RBC 2.71 (L) 4.22 - 5.81 MIL/uL   Hemoglobin 8.0 (L) 13.0 - 17.0 g/dL   HCT 25.0 (L) 39.0 - 52.0 %   MCV 92.3 80.0 - 100.0 fL   MCH 29.5 26.0 - 34.0 pg   MCHC 32.0 30.0 - 36.0 g/dL   RDW 13.1 11.5 - 15.5 %   Platelets 309 150 - 400 K/uL   nRBC 0.0 0.0 - 0.2 %    Comment: Performed at [facility], [place], [place] [address]  Troponin I (High Sensitivity)     Status: Abnormal   Collection Time: 07/27/19  5:11 PM  Result Value Ref Range   Troponin I (High Sensitivity) 32 (H) <18 ng/L    Comment: (NOTE) Elevated high sensitivity troponin I (hsTnI) values and significant  changes across serial measurements may suggest ACS but many other  chronic and acute conditions are known to elevate hsTnI results.  Refer to the "Links" section for chest pain algorithms and additional  guidance. Performed at [facility], [place], [place] [address]   Troponin I (High Sensitivity)     Status: Abnormal   Collection Time: 07/27/19  7:09 PM  Result Value Ref Range   Troponin I (High Sensitivity) 55 (H) <18 ng/L    Comment: READ BACK  AND VERIFIED WITH Odis Hollingshead RN AT 2121 ON 07/27/2019 SNG (NOTE) Elevated high sensitivity troponin I (hsTnI) values and significant  changes across serial measurements may suggest ACS but many other  chronic and acute conditions are known to elevate hsTnI results.  Refer to the "Links" section for chest pain algorithms and additional  guidance. Performed at [facility], [address], [place], [place] [address]   SARS  Coronavirus 2 [facility] order, Performed in [facility] hospital lab) Nasopharyngeal Nasopharyngeal Swab     Status: None   Collection Time: 07/27/19 10:36 PM   Specimen: Nasopharyngeal Swab  Result Value Ref Range   SARS Coronavirus 2 NEGATIVE NEGATIVE    Comment: (NOTE) If result is NEGATIVE SARS-CoV-2 target nucleic acids are NOT DETECTED. The SARS-CoV-2 RNA is generally detectable in upper and lower  respiratory specimens during the acute phase of infection. The lowest  concentration of SARS-CoV-2 viral copies this assay can detect is 250  copies / mL. A negative result does not preclude SARS-CoV-2 infection  and should not be used as the sole basis for treatment or other  patient management decisions.  A negative result may occur with  improper specimen collection / handling, submission of specimen other  than nasopharyngeal swab, presence of viral mutation(s) within the  areas targeted by this assay, and inadequate number of viral copies  (<250 copies / mL). A negative result must be combined with clinical  observations, patient history, and epidemiological information. If result is POSITIVE SARS-CoV-2 target nucleic acids are DETECTED. The SARS-CoV-2 RNA is generally detectable in upper and lower  respiratory specimens dur ing the acute phase of infection.  Positive  results are indicative of active infection with SARS-CoV-2.  Clinical  correlation with patient history and other diagnostic information is  necessary to determine patient  infection status.  Positive results do  not rule out bacterial infection or co-infection with other viruses. If result is PRESUMPTIVE POSTIVE SARS-CoV-2 nucleic acids MAY BE PRESENT.   A presumptive positive result was obtained on the submitted specimen  and confirmed on repeat testing.  While 2019 novel coronavirus  (SARS-CoV-2) nucleic acids may be present in the submitted sample  additional confirmatory testing may be necessary for epidemiological  and / or clinical management purposes  to differentiate between  SARS-CoV-2 and other Sarbecovirus currently known to infect humans.  If clinically indicated additional testing with an alternate test  methodology [phone]) is advised. The SARS-CoV-2 RNA is generally  detectable in upper and lower respiratory sp ecimens during the acute  phase of infection. The expected result is Negative. Fact Sheet for Patients:  StrictlyIdeas.no Fact Sheet for Healthcare Providers: BankingDealers.co.za This test is not yet approved or cleared by the [place] FDA and has been authorized for detection and/or diagnosis of SARS-CoV-2 by FDA under an Emergency Use Authorization (EUA).  This EUA will remain in effect (meaning this test can be used) for the duration of the COVID-19 declaration under Section 564(b)(1) of the Act, 21 U.S.C. section 360bbb-3(b)(1), unless the authorization is terminated or revoked sooner. Performed at [facility], [address] [place], [place] [address]    Dg Chest 2 View  Result Date: 07/27/2019 CLINICAL DATA:  Shortness of breath EXAM: CHEST - 2 VIEW COMPARISON:  10/19/2018 FINDINGS: Cardiomegaly status post median sternotomy. Pulmonary vascular prominence without overt edema or other airspace opacity. The visualized skeletal structures are unremarkable. IMPRESSION: Cardiomegaly. Pulmonary vascular prominence without overt edema or other airspace opacity.  Electronically Signed   By: Eddie Candle M.D.   On: 07/27/2019 17:48   Ct Lumbar Spine Wo Contrast  Result Date: 07/27/2019 CLINICAL DATA:  Back pain EXAM: CT LUMBAR SPINE WITHOUT CONTRAST TECHNIQUE: Multidetector CT imaging of the lumbar spine was performed without intravenous contrast administration. Multiplanar CT image reconstructions were also generated. COMPARISON:  July 11, 2019 FINDINGS: Segmentation: There are 5 non-rib bearing lumbar type vertebral bodies with  the last intervertebral disc space labeled as L5-S1. Alignment: Normal Vertebrae: The vertebral body heights are well maintained. No fracture, malalignment, or pathologic osseous lesions seen. Paraspinal and other soft tissues: There is new soft tissue edema and focal heterogeneous soft tissue density seen overlying L4-L5 . Surrounding subcutaneous edema and skin thickening is seen within this region. The sacroiliac joints appear to be intact. Scattered aortic atherosclerosis is noted. Disc levels: Mild disc height loss and facet arthrosis most notable at L4-L5 and L5-S1. IMPRESSION: There is new significant soft tissue edema and heterogeneous soft tissue collection seen at L4-L5 in the posterior paraspinal soft tissues which could represent early phlegmon versus soft tissue hematoma given the patient's history. If further evaluation is required would recommend MRI with contrast. These results were called by telephone at the time of interpretation on 07/27/2019 at 11:29 pm to provider PHILLIP STAFFORD , who verbally acknowledged these results. Electronically Signed   By: Prudencio Pair M.D.   On: 07/27/2019 23:30    Pending Labs [employer] (From admission, onward)    Start     Ordered   Signed and Held  Hemoglobin A1c  Once,   R    Comments: To assess prior glycemic control    Signed and Held   Signed and Held  TSH  Add-on,   R     Signed and Held          Vitals/Pain Today's Vitals   07/27/19 1707 07/27/19 2303 07/27/19  2304 07/28/19 0005  BP:   (!) 173/98 139/71  Pulse:   94 88  Resp:   18 17  Temp:      TempSrc:      SpO2:   97% 96%  Weight: 124.7 kg     Height: 6\' 1"  (1.854 m)     PainSc: 7  5       Isolation Precautions No active isolations  Medications Medications  vancomycin (VANCOCIN) 2,000 mg in sodium chloride 0.9 % 500 mL IVPB (2,000 mg Intravenous New Bag/Given 07/28/19 0046)  HYDROmorphone (DILAUDID) injection 1 mg (1 mg Intravenous Given 07/27/19 2230)  furosemide (LASIX) injection 40 mg (40 mg Intravenous Given 07/27/19 2230)  aspirin chewable tablet 324 mg (324 mg Oral Given 07/27/19 2235)  cefTRIAXone (ROCEPHIN) 2 g in sodium chloride 0.9 % 100 mL IVPB (0 g Intravenous Stopped 07/28/19 0043)    Mobility walks Low fall risk   Focused Assessments Pulmonary Assessment Handoff:  Lung sounds:   O2 Device: Room Air        R Recommendations: See Admitting Provider Note  Report given to:   Additional Notes:

## 2019-07-28 NOTE — Progress Notes (Signed)
RN notifies the neurosurgeon per wife request via text for updates on consult. I will continue to assess.

## 2019-07-29 DIAGNOSIS — I5023 Acute on chronic systolic (congestive) heart failure: Secondary | ICD-10-CM

## 2019-07-29 LAB — CBC
HCT: 22 % — ABNORMAL LOW (ref 39.0–52.0)
Hemoglobin: 7 g/dL — ABNORMAL LOW (ref 13.0–17.0)
MCH: 29.2 pg (ref 26.0–34.0)
MCHC: 31.8 g/dL (ref 30.0–36.0)
MCV: 91.7 fL (ref 80.0–100.0)
Platelets: 259 10*3/uL (ref 150–400)
RBC: 2.4 MIL/uL — ABNORMAL LOW (ref 4.22–5.81)
RDW: 13.2 % (ref 11.5–15.5)
WBC: 12.4 10*3/uL — ABNORMAL HIGH (ref 4.0–10.5)
nRBC: 0 % (ref 0.0–0.2)

## 2019-07-29 LAB — BASIC METABOLIC PANEL
Anion gap: 8 (ref 5–15)
BUN: 66 mg/dL — ABNORMAL HIGH (ref 6–20)
CO2: 22 mmol/L (ref 22–32)
Calcium: 7.8 mg/dL — ABNORMAL LOW (ref 8.9–10.3)
Chloride: 106 mmol/L (ref 98–111)
Creatinine, Ser: 3.45 mg/dL — ABNORMAL HIGH (ref 0.61–1.24)
GFR calc Af Amer: 22 mL/min — ABNORMAL LOW (ref >60)
GFR calc non Af Amer: 19 mL/min — ABNORMAL LOW (ref >60)
Glucose, Bld: 153 mg/dL — ABNORMAL HIGH (ref 70–99)
Potassium: 4.5 mmol/L (ref 3.5–5.1)
Sodium: 136 mmol/L (ref 135–145)

## 2019-07-29 LAB — GLUCOSE, CAPILLARY
Glucose-Capillary: 133 mg/dL — ABNORMAL HIGH (ref 70–99)
Glucose-Capillary: 200 mg/dL — ABNORMAL HIGH (ref 70–99)
Glucose-Capillary: 172 mg/dL — ABNORMAL HIGH (ref 70–99)
Glucose-Capillary: 182 mg/dL — ABNORMAL HIGH (ref 70–99)

## 2019-07-29 LAB — PREPARE RBC (CROSSMATCH)

## 2019-07-29 LAB — HEMOGLOBIN A1C
Hgb A1c MFr Bld: 7.2 % — ABNORMAL HIGH (ref 4.8–5.6)
Mean Plasma Glucose: 160 mg/dL

## 2019-07-29 LAB — ABO/RH: ABO/RH(D): A POS

## 2019-07-29 MED ORDER — SODIUM CHLORIDE 0.9 % IV SOLN
INTRAVENOUS | Status: DC | PRN
  Administered 2019-07-29 – 2019-07-31 (×4): 250 mL via INTRAVENOUS

## 2019-07-29 MED ORDER — SODIUM CHLORIDE 0.9% IV SOLUTION
Freq: Once | INTRAVENOUS | Status: AC
  Administered 2019-07-29: 16:00:00 via INTRAVENOUS

## 2019-07-29 MED ORDER — SODIUM CHLORIDE 0.9 % IV SOLN
1.0000 g | INTRAVENOUS | Status: DC
  Administered 2019-07-29: 1 g via INTRAVENOUS
  Filled 2019-07-29: qty 10
  Filled 2019-07-29: qty 1

## 2019-07-29 NOTE — Progress Notes (Signed)
Progress Note   Subjective   Very ill,  Not very interactive.  History per wife.  No new CP or issues overnight.  About to receive PRBCs.  Inpatient Medications    Scheduled Meds: . atorvastatin  40 mg Oral QHS  . carvedilol  25 mg Oral BID WC  . docusate sodium  100 mg Oral BID  . famotidine  20 mg Oral QHS  . ferrous sulfate  325 mg Oral Daily  . furosemide  40 mg Intravenous Q12H  . hydrALAZINE  25 mg Oral BID  . insulin aspart  0-5 Units Subcutaneous QHS  . insulin aspart  0-9 Units Subcutaneous TID WC  . isosorbide mononitrate  30 mg Oral Daily  . polyethylene glycol  17 g Oral Daily  . sodium bicarbonate  1,300 mg Oral BID   Continuous Infusions:  PRN Meds: acetaminophen **OR** acetaminophen, HYDROmorphone (DILAUDID) injection, nitroGLYCERIN, ondansetron **OR** ondansetron (ZOFRAN) IV, oxyCODONE   Vital Signs    Vitals:   07/28/19 1923 07/29/19 0347 07/29/19 0352 07/29/19 0831  BP: (!) 105/55 (!) 148/71  (!) 156/76  Pulse: 87 86  85  Resp: 20 18  18   Temp: 98.4 F (36.9 C) 99.5 F (37.5 C)  99.3 F (37.4 C)  TempSrc: Oral Oral  Oral  SpO2: 90% 95%  94%  Weight:   124.6 kg   Height:        Intake/Output Summary (Last 24 hours) at 07/29/2019 1328 Last data filed at 07/29/2019 1749 Gross per 24 hour  Intake 340 ml  Output 2125 ml  Net -1785 ml   Filed Weights   07/27/19 1707 07/28/19 0246 07/29/19 0352  Weight: 124.7 kg 124.4 kg 124.6 kg    Telemetry    Sinus with occasional NSVT - Personally Reviewed  Physical Exam   GEN- The patient is very ill appearing, sleeping but interactive Head- normocephalic, atraumatic Eyes-  Sclera clear, conjunctiva pink Ears- hearing intact Oropharynx- clear Neck- supple, Lungs-   normal work of breathing Heart- Regular rate and rhythm  GI- distended Extremities- no clubbing, cyanosis,  + dependant edema  MS- no significant deformity or atrophy Skin- no rash or lesion Psych- euthymic mood, full affect  Neuro- strength and sensation are intact   Labs    Chemistry Recent Labs  Lab 07/27/19 1711 07/28/19 0547 07/29/19 0520  NA 134*  --  136  K 4.7  --  4.5  CL 106  --  106  CO2 20*  --  22  GLUCOSE 344*  --  153*  BUN 69*  --  66*  CREATININE 3.55* 3.39* 3.45*  CALCIUM 8.3*  --  7.8*  GFRNONAA 18* 19* 19*  GFRAA 21* 22* 22*  ANIONGAP 8  --  8     Hematology Recent Labs  Lab 07/27/19 1711 07/29/19 0520  WBC 14.5* 12.4*  RBC 2.71* 2.40*  HGB 8.0* 7.0*  HCT 25.0* 22.0*  MCV 92.3 91.7  MCH 29.5 29.2  MCHC 32.0 31.8  RDW 13.1 13.2  PLT 309 259    Patient Profile:   [name] [name] is a 58 y.o. male with a hx of CAD s/p four-vessel CABG (04/2014), HFrEF secondary to ICM, NSVT, paroxysmal SVT, PVD, poorly controlled insulin-dependent diabetes with diabetic foot ulcers and diabetic peripheral neuropathy requiring amputations of the left toes, osteomyelitis, CKD stage IV, hypertension, hyperlipidemia, asymptomatic orthostatic hypotension, medication compliance issues secondary to financial issues, and who is being seen today for the evaluation of shortness  of breath at the request of Dr. Margaretmary Eddy.   Assessment & Plan    1.  CAD s/p prior CABG,  Acute on chronic systolic dysfunction/ ischemic CM Therapy complicated by medical noncompliance Continue diuresis as able Complicated by renal failure and anemia Given narrow QRS, not a candidate for CRT.  Given acute medical illness and multiple chronic comorbidities, not a good candidate for primary prevention ICD either.  I would advise medical therapy long term.    2. Hypertensive cardiorenal disease with CHF and stage IV renal disease Continue current therapy Would not be too aggressive with medical therapy in the setting of anemia  3. Acute anemia Per primary team  4. Stage IV renal failure Complicates treatment of CHF  Quite ill with multiple acute on chronic comorbidities Ultimately, palliative measures may be best  Very complicated patient at risk for further decompensation/ death.  A high level of decision making was required for this encounter.  Thompson Grayer MD, [facility] 07/29/2019 1:28 PM

## 2019-07-29 NOTE — Progress Notes (Signed)
PT Cancellation Note  Patient Details Name: [name] [name] MRN: [id number] DOB: June 12, 1961   Cancelled Treatment:    Reason Eval/Treat Not Completed: Medical issues which prohibited therapy.  Pt is waiting for a neurosurgical consult and will hold PT until this is done.   Ramond Dial 07/29/2019, 9:57 AM   Mee Hives, PT MS Acute Rehab Dept. Number: [place]

## 2019-07-29 NOTE — Progress Notes (Signed)
[facility]  ROUNDING NOTE   Subjective:   Wife at bedside.   MRI with lesion next to L4 7cm in size.   UOP 1300  Hemoglobin 7.   Objective:  Vital signs in last 24 hours:  Temp:  [98.4 F (36.9 C)-99.5 F (37.5 C)] 99.3 F (37.4 C) (09/12 0831) Pulse Rate:  [80-87] 85 (09/12 0831) Resp:  [18-20] 18 (09/12 0831) BP: (105-156)/(55-76) 156/76 (09/12 0831) SpO2:  [90 %-95 %] 94 % (09/12 0831) Weight:  [124.6 kg] 124.6 kg (09/12 0352)  Weight change: -0.091 kg Filed Weights   07/27/19 1707 07/28/19 0246 07/29/19 0352  Weight: 124.7 kg 124.4 kg 124.6 kg    Intake/Output: I/O last 3 completed shifts: In: 1119.8 [P.O.:360; IV Piggyback:759.8] Out: 2325 [Urine:2325]   Intake/Output this shift:  Total I/O In: -  Out: 825 [Urine:825]  Physical Exam: General: NAD,   Head: Normocephalic, atraumatic. Moist oral mucosal membranes  Eyes: Anicteric, PERRL  Neck: Supple, trachea midline  Lungs:  Clear to auscultation  Heart: Regular rate and rhythm  Abdomen:  Soft, nontender,   Extremities:  + peripheral edema.  Neurologic: Nonfocal, moving all four extremities  Skin: No lesions        Basic Metabolic Panel: Recent Labs  Lab 07/27/19 1711 07/28/19 0303 07/28/19 0547 07/29/19 0520  NA 134*  --   --  136  K 4.7  --   --  4.5  CL 106  --   --  106  CO2 20*  --   --  22  GLUCOSE 344*  --   --  153*  BUN 69*  --   --  66*  CREATININE 3.55*  --  3.39* 3.45*  CALCIUM 8.3*  --   --  7.8*  MG  --  2.3  --   --     Liver Function Tests: No results for input(s): AST, ALT, ALKPHOS, BILITOT, PROT, ALBUMIN in the last 168 hours. No results for input(s): LIPASE, AMYLASE in the last 168 hours. No results for input(s): AMMONIA in the last 168 hours.  CBC: Recent Labs  Lab 07/27/19 1711 07/29/19 0520  WBC 14.5* 12.4*  HGB 8.0* 7.0*  HCT 25.0* 22.0*  MCV 92.3 91.7  PLT 309 259    Cardiac Enzymes: No results for input(s): CKTOTAL, CKMB, CKMBINDEX,  TROPONINI in the last 168 hours.  BNP: Invalid input(s): POCBNP  CBG: Recent Labs  Lab 07/28/19 0759 07/28/19 1117 07/28/19 1645 07/28/19 2039 07/29/19 0743  GLUCAP 180* 222* 234* 232* 133*    Microbiology: Results for orders placed or performed during the hospital encounter of 07/27/19  SARS Coronavirus 2 [facility] order, Performed in [facility] hospital lab) Nasopharyngeal Nasopharyngeal Swab     Status: None   Collection Time: 07/27/19 10:36 PM   Specimen: Nasopharyngeal Swab  Result Value Ref Range Status   SARS Coronavirus 2 NEGATIVE NEGATIVE Final    Comment: (NOTE) If result is NEGATIVE SARS-CoV-2 target nucleic acids are NOT DETECTED. The SARS-CoV-2 RNA is generally detectable in upper and lower  respiratory specimens during the acute phase of infection. The lowest  concentration of SARS-CoV-2 viral copies this assay can detect is 250  copies / mL. A negative result does not preclude SARS-CoV-2 infection  and should not be used as the sole basis for treatment or other  patient management decisions.  A negative result may occur with  improper specimen collection / handling, submission of specimen other  than nasopharyngeal swab, presence  of viral mutation(s) within the  areas targeted by this assay, and inadequate number of viral copies  (<250 copies / mL). A negative result must be combined with clinical  observations, patient history, and epidemiological information. If result is POSITIVE SARS-CoV-2 target nucleic acids are DETECTED. The SARS-CoV-2 RNA is generally detectable in upper and lower  respiratory specimens dur ing the acute phase of infection.  Positive  results are indicative of active infection with SARS-CoV-2.  Clinical  correlation with patient history and other diagnostic information is  necessary to determine patient infection status.  Positive results do  not rule out bacterial infection or co-infection with other viruses. If result is  PRESUMPTIVE POSTIVE SARS-CoV-2 nucleic acids MAY BE PRESENT.   A presumptive positive result was obtained on the submitted specimen  and confirmed on repeat testing.  While 2019 novel coronavirus  (SARS-CoV-2) nucleic acids may be present in the submitted sample  additional confirmatory testing may be necessary for epidemiological  and / or clinical management purposes  to differentiate between  SARS-CoV-2 and other Sarbecovirus currently known to infect humans.  If clinically indicated additional testing with an alternate test  methodology [phone]) is advised. The SARS-CoV-2 RNA is generally  detectable in upper and lower respiratory sp ecimens during the acute  phase of infection. The expected result is Negative. Fact Sheet for Patients:  StrictlyIdeas.no Fact Sheet for Healthcare Providers: BankingDealers.co.za This test is not yet approved or cleared by the [place] FDA and has been authorized for detection and/or diagnosis of SARS-CoV-2 by FDA under an Emergency Use Authorization (EUA).  This EUA will remain in effect (meaning this test can be used) for the duration of the COVID-19 declaration under Section 564(b)(1) of the Act, 21 U.S.C. section 360bbb-3(b)(1), unless the authorization is terminated or revoked sooner. Performed at [facility], [address] [place], [place] [address]     Coagulation Studies: No results for input(s): LABPROT, INR in the last 72 hours.  Urinalysis: No results for input(s): COLORURINE, LABSPEC, PHURINE, GLUCOSEU, HGBUR, BILIRUBINUR, KETONESUR, PROTEINUR, UROBILINOGEN, NITRITE, LEUKOCYTESUR in the last 72 hours.  Invalid input(s): APPERANCEUR    Imaging: Dg Chest 2 View  Result Date: 07/27/2019 CLINICAL DATA:  Shortness of breath EXAM: CHEST - 2 VIEW COMPARISON:  10/19/2018 FINDINGS: Cardiomegaly status post median sternotomy. Pulmonary vascular prominence without overt edema  or other airspace opacity. The visualized skeletal structures are unremarkable. IMPRESSION: Cardiomegaly. Pulmonary vascular prominence without overt edema or other airspace opacity. Electronically Signed   By: Eddie Candle M.D.   On: 07/27/2019 17:48   Ct Lumbar Spine Wo Contrast  Result Date: 07/27/2019 CLINICAL DATA:  Back pain EXAM: CT LUMBAR SPINE WITHOUT CONTRAST TECHNIQUE: Multidetector CT imaging of the lumbar spine was performed without intravenous contrast administration. Multiplanar CT image reconstructions were also generated. COMPARISON:  July 11, 2019 FINDINGS: Segmentation: There are 5 non-rib bearing lumbar type vertebral bodies with the last intervertebral disc space labeled as L5-S1. Alignment: Normal Vertebrae: The vertebral body heights are well maintained. No fracture, malalignment, or pathologic osseous lesions seen. Paraspinal and other soft tissues: There is new soft tissue edema and focal heterogeneous soft tissue density seen overlying L4-L5 . Surrounding subcutaneous edema and skin thickening is seen within this region. The sacroiliac joints appear to be intact. Scattered aortic atherosclerosis is noted. Disc levels: Mild disc height loss and facet arthrosis most notable at L4-L5 and L5-S1. IMPRESSION: There is new significant soft tissue edema and heterogeneous soft tissue collection seen at  L4-L5 in the posterior paraspinal soft tissues which could represent early phlegmon versus soft tissue hematoma given the patient's history. If further evaluation is required would recommend MRI with contrast. These results were called by telephone at the time of interpretation on 07/27/2019 at 11:29 pm to provider PHILLIP STAFFORD , who verbally acknowledged these results. Electronically Signed   By: Prudencio Pair M.D.   On: 07/27/2019 23:30   Mr Lumbar Spine Wo Contrast  Result Date: 07/28/2019 CLINICAL DATA:  58 year old male with back pain. Abnormal subcutaneous lumbar paraspinal soft  tissues on CT yesterday. EXAM: MRI LUMBAR SPINE WITHOUT CONTRAST TECHNIQUE: Multiplanar, multisequence MR imaging of the lumbar spine was performed. No intravenous contrast was administered. COMPARISON:  Lumbar spine CT 07/27/2019. FINDINGS: The patient was scanned in the lateral decubitus position due to pain, and subsequently the sagittal images are somewhat oblique. Segmentation:  Normal on the CT yesterday. Alignment:  Straightening of lumbar lordosis.  No spondylolisthesis. Vertebrae: No marrow edema or evidence of acute osseous abnormality. Visualized bone marrow signal is within normal limits. Intact visible sacrum and SI joints. Conus medullaris and cauda equina: Conus extends to the L1 level. No lower spinal cord or conus signal abnormality. Paraspinal and other soft tissues: Negative visible abdominal viscera. Large body habitus with abundant lumbar posterior subcutaneous edema. Overlying the L4 level to the right of midline there is a 7 centimeter area of confluent abnormal signal in the subcutaneous fat (series 9, image 17 and series 13, image 28). With associated inflammation in the underlying superficial aspect of the right lumbar rectus spine I muscles from L2-L3 inferiorly (series 14, images 20 through 28). No contrast was administered but there is no drainable fluid identified. No underlying bony changes are evident. Disc levels: T12-L1:  Negative. L1-L2:  Mild facet hypertrophy. L2-L3:  Mild facet hypertrophy. L3-L4:  Mild far lateral disc bulging.  No definite stenosis. L4-L5: Mild circumferential disc bulge. No spinal or lateral recess stenosis. Mild bilateral L4 foraminal stenosis probably greater on the left. L5-S1:  Negative disc.  Mild facet hypertrophy.  No stenosis. IMPRESSION: 1. Large round 7 centimeter nonspecific soft tissue lesion in the subcutaneous fat overlying the L4 vertebra to the right of midline. This could be an infectious phlegmon or a hematoma. There is underlying edema  within the superficial aspect of the right erector spinae muscle from L2 to the sacrum, but no drainable fluid collection. There is abundant generalized subcutaneous edema throughout the lumbar spine. 2. No underlying acute osseous abnormality in the spine. 3. Relatively mild for age lumbar degeneration with no spinal stenosis and only mild bilateral L4 foraminal stenosis. Electronically Signed   By: Genevie Ann M.D.   On: 07/28/2019 16:21     Medications:   . cefTRIAXone (ROCEPHIN)  IV Stopped (07/28/19 2141)  . vancomycin     . aspirin EC  81 mg Oral Daily  . atorvastatin  40 mg Oral QHS  . carvedilol  25 mg Oral BID WC  . docusate sodium  100 mg Oral BID  . famotidine  20 mg Oral QHS  . ferrous sulfate  325 mg Oral Daily  . furosemide  40 mg Intravenous Q12H  . hydrALAZINE  25 mg Oral BID  . insulin aspart  0-5 Units Subcutaneous QHS  . insulin aspart  0-9 Units Subcutaneous TID WC  . isosorbide mononitrate  30 mg Oral Daily  . polyethylene glycol  17 g Oral Daily  . sodium bicarbonate  1,300 mg Oral BID  acetaminophen **OR** acetaminophen, HYDROmorphone (DILAUDID) injection, nitroGLYCERIN, ondansetron **OR** ondansetron (ZOFRAN) IV, oxyCODONE  Assessment/ Plan:  [name] [name] is a 58 y.o. white male with hypertension, diabetes mellitus type II, diabetic neuropathy, systolic congestive heart failure, peripheral vascular disease status post left toe amputations, autonomic neuropathy, coronary artery disease status post CABG, history of osteomyelitis, who has been admitted to [facility] on 07/27/2019 for acute exacerbation of congestive heart failure.   1. Acute renal failure on chronic kidney disease stage IV with nephrotic range proteinuria: baseline creatinine of 3.02, GFR of 22 on 07/13/2019.  Chronic kidney disease secondary to diabetic nephropathy. Chronic kidney disease complicated with hyperkalemia, metabolic acidosis and hypoalbuminemia.  Acute renal failure secondary to acute  cardiorenal syndrome.  - Continue sodium bicarbonate.   2. Hypertension and acute exacerbation of chronic systolic congestive heart failure. Home regimen of losartan, carvedilol, hydralazine, furosemide, and isosorbide mononitrate - IV furosemide  3. Anemia with chronic kidney disease: hemoglobin 7, normocytic.  - low threshold for transfusion  4. Diabetes mellitus type II with chronic kidney disease: hemoglobin A1c of 6.9% 05/23/2019.   5. Spinal mass: L4 mass: 7cm on MRI.    LOS: 1 Zoee Heeney 9/12/202010:24 AM

## 2019-07-29 NOTE — Progress Notes (Signed)
[facility] NAME: [name] [name]    MR#:  [id number]  DATE OF BIRTH:  08-May-1961  SUBJECTIVE:   Patient is not the best historian. Patient's wife gives most of the history. Came in with increasing shortness of breath was found to have CHF also complains of back pain. According to the wife patient fell rule over from the bed few days ago prior to coming to the hospital. He is been complaining of lower back pain mainly on movement. REVIEW OF SYSTEMS:   Review of Systems  Constitutional: Negative for chills, fever and weight loss.  HENT: Negative for ear discharge, ear pain and nosebleeds.   Eyes: Negative for blurred vision, pain and discharge.  Respiratory: Positive for shortness of breath. Negative for sputum production, wheezing and stridor.   Cardiovascular: Positive for leg swelling. Negative for chest pain, palpitations, orthopnea and PND.  Gastrointestinal: Negative for abdominal pain, diarrhea, nausea and vomiting.  Genitourinary: Negative for frequency and urgency.  Musculoskeletal: Positive for back pain. Negative for joint pain.  Neurological: Positive for weakness. Negative for sensory change, speech change and focal weakness.  Psychiatric/Behavioral: Negative for depression and hallucinations. The patient is not nervous/anxious.    Tolerating Diet:some Tolerating PT: pending  DRUG ALLERGIES:   Allergies  Allergen Reactions  . Other Other (See Comments)    Pt. And wife state that he had a reaction to "some" antibiotic but they do not know what the name of it was.They are are very apprehensive about him receiving any antibiotic. Zosyn, Clindamycin, & Vancomycin (all started together unsure which caused reaction 07/18/2013)    VITALS:  Blood pressure (!) 156/76, pulse 85, temperature 99.3 F (37.4 C), temperature source Oral, resp. rate 18, height 6\' 1"  (1.854 m), weight 124.6 kg, SpO2 94 %.  PHYSICAL EXAMINATION:    Physical Exam  GENERAL:  58 y.o.-year-old patient lying in the bed with no acute distress. He is obese EYES: Pupils equal, round, reactive to light and accommodation. No scleral icterus. Extraocular muscles intact.  HEENT: Head atraumatic, normocephalic. Oropharynx and nasopharynx clear.  NECK:  Supple, no jugular venous distention. No thyroid enlargement, no tenderness.  LUNGS: decreased breath sounds bilaterally, no wheezing, rales, rhonchi. No use of accessory muscles of respiration.  CARDIOVASCULAR: S1, S2 normal. No murmurs, rubs, or gallops.  ABDOMEN: Soft, nontender, nondistended. Bowel sounds present. No organomegaly or mass.  EXTREMITIES: No cyanosis, clubbing or edema b/l.    NEUROLOGIC: Cranial nerves II through XII are intact. No focal Motor or sensory deficits b/l. Some tenderness in the lower back. PSYCHIATRIC:  patient is alert and oriented x 3.  SKIN: No obvious rash, lesion, or ulcer.   LABORATORY PANEL:  CBC Recent Labs  Lab 07/29/19 0520  WBC 12.4*  HGB 7.0*  HCT 22.0*  PLT 259    Chemistries  Recent Labs  Lab 07/28/19 0303  07/29/19 0520  NA  --   --  136  K  --   --  4.5  CL  --   --  106  CO2  --   --  22  GLUCOSE  --   --  153*  BUN  --   --  66*  CREATININE  --    < > 3.45*  CALCIUM  --   --  7.8*  MG 2.3  --   --    < > = values in this interval not displayed.   Cardiac Enzymes No  results for input(s): TROPONINI in the last 168 hours. RADIOLOGY:  Dg Chest 2 View  Result Date: 07/27/2019 CLINICAL DATA:  Shortness of breath EXAM: CHEST - 2 VIEW COMPARISON:  10/19/2018 FINDINGS: Cardiomegaly status post median sternotomy. Pulmonary vascular prominence without overt edema or other airspace opacity. The visualized skeletal structures are unremarkable. IMPRESSION: Cardiomegaly. Pulmonary vascular prominence without overt edema or other airspace opacity. Electronically Signed   By: Eddie Candle M.D.   On: 07/27/2019 17:48   Ct Lumbar Spine Wo  Contrast  Result Date: 07/27/2019 CLINICAL DATA:  Back pain EXAM: CT LUMBAR SPINE WITHOUT CONTRAST TECHNIQUE: Multidetector CT imaging of the lumbar spine was performed without intravenous contrast administration. Multiplanar CT image reconstructions were also generated. COMPARISON:  July 11, 2019 FINDINGS: Segmentation: There are 5 non-rib bearing lumbar type vertebral bodies with the last intervertebral disc space labeled as L5-S1. Alignment: Normal Vertebrae: The vertebral body heights are well maintained. No fracture, malalignment, or pathologic osseous lesions seen. Paraspinal and other soft tissues: There is new soft tissue edema and focal heterogeneous soft tissue density seen overlying L4-L5 . Surrounding subcutaneous edema and skin thickening is seen within this region. The sacroiliac joints appear to be intact. Scattered aortic atherosclerosis is noted. Disc levels: Mild disc height loss and facet arthrosis most notable at L4-L5 and L5-S1. IMPRESSION: There is new significant soft tissue edema and heterogeneous soft tissue collection seen at L4-L5 in the posterior paraspinal soft tissues which could represent early phlegmon versus soft tissue hematoma given the patient's history. If further evaluation is required would recommend MRI with contrast. These results were called by telephone at the time of interpretation on 07/27/2019 at 11:29 pm to provider PHILLIP STAFFORD , who verbally acknowledged these results. Electronically Signed   By: Prudencio Pair M.D.   On: 07/27/2019 23:30   Mr Lumbar Spine Wo Contrast  Result Date: 07/28/2019 CLINICAL DATA:  58 year old male with back pain. Abnormal subcutaneous lumbar paraspinal soft tissues on CT yesterday. EXAM: MRI LUMBAR SPINE WITHOUT CONTRAST TECHNIQUE: Multiplanar, multisequence MR imaging of the lumbar spine was performed. No intravenous contrast was administered. COMPARISON:  Lumbar spine CT 07/27/2019. FINDINGS: The patient was scanned in the  lateral decubitus position due to pain, and subsequently the sagittal images are somewhat oblique. Segmentation:  Normal on the CT yesterday. Alignment:  Straightening of lumbar lordosis.  No spondylolisthesis. Vertebrae: No marrow edema or evidence of acute osseous abnormality. Visualized bone marrow signal is within normal limits. Intact visible sacrum and SI joints. Conus medullaris and cauda equina: Conus extends to the L1 level. No lower spinal cord or conus signal abnormality. Paraspinal and other soft tissues: Negative visible abdominal viscera. Large body habitus with abundant lumbar posterior subcutaneous edema. Overlying the L4 level to the right of midline there is a 7 centimeter area of confluent abnormal signal in the subcutaneous fat (series 9, image 17 and series 13, image 28). With associated inflammation in the underlying superficial aspect of the right lumbar rectus spine I muscles from L2-L3 inferiorly (series 14, images 20 through 28). No contrast was administered but there is no drainable fluid identified. No underlying bony changes are evident. Disc levels: T12-L1:  Negative. L1-L2:  Mild facet hypertrophy. L2-L3:  Mild facet hypertrophy. L3-L4:  Mild far lateral disc bulging.  No definite stenosis. L4-L5: Mild circumferential disc bulge. No spinal or lateral recess stenosis. Mild bilateral L4 foraminal stenosis probably greater on the left. L5-S1:  Negative disc.  Mild facet hypertrophy.  No stenosis. IMPRESSION:  1. Large round 7 centimeter nonspecific soft tissue lesion in the subcutaneous fat overlying the L4 vertebra to the right of midline. This could be an infectious phlegmon or a hematoma. There is underlying edema within the superficial aspect of the right erector spinae muscle from L2 to the sacrum, but no drainable fluid collection. There is abundant generalized subcutaneous edema throughout the lumbar spine. 2. No underlying acute osseous abnormality in the spine. 3. Relatively mild  for age lumbar degeneration with no spinal stenosis and only mild bilateral L4 foraminal stenosis. Electronically Signed   By: Genevie Ann M.D.   On: 07/28/2019 16:21   ASSESSMENT AND PLAN:  Garik Diamant Murphyis a 58 y.o.malewith a hx of CAD s/p four-vessel CABG (04/2014), HFrEF secondary to ICM, NSVT, paroxysmal SVT, PVD, poorly controlled insulin-dependent diabetes with diabetic foot ulcers and diabetic peripheral neuropathyrequiring amputations of the left toes, osteomyelitis, CKD stage IV, hypertension, hyperlipidemia  1. Acute on chronic systolic dysfunction with history of ischemic cardiomyopathy -currently on IV Lasix 40 mg BID -monitor input output, creatinine -appreciate cardiology and nephrology input -low sodium diet -assess for home oxygen -patient has history of coronary artery disease status post CABG  2. Low back pain acute -wife reports patient rolled over and fell off his bed few days prior to coming to the hospital. He is been laying in bed since and complains of acute low back pain. Patient denies any sensory symptoms bowel or urinary incontinence he is able to walk to the bathroom with assistance -MRI of the lumbar spine shows Large round 7 centimeter nonspecific soft tissue lesion in the subcutaneous fat overlying the L4 vertebra to the right of midline. This could be an infectious phlegmon or a hematoma. -Empiric antibiotic with IV Rocephin-- will discussed with infectious disease. -patient is not spiking any fever. -I spoke with neurosurgery on call Dr. Vertell Limber and he has reviewed the MRI with me and Dr. Margaretmary Eddy yesterday. Dr. Vertell Limber does not feel any surgical intervention at present. This likely is hematoma with his recent fall and drop in hemoglobin. I tried to explain to patient's wife in the room. She is very frustrated and wants neurosurgery to come see patient here. Told her the neurosurgeon on call does not come to the hospital. Looking at the MRI Dr. Vertell Limber does not feel  patient has any nerve compression or any major physical deformity requires any intervention. -Will continue IV and PO pain meds.\ -I will discussed with nephrology to see if he can add small dose of Lyrica or gabapentin -okay to start physical therapy per neurosurgery  3. Acute on chronic kidney disease stage IV -appreciate Dr. Assunta Gambles input -monitor input output and creatinine -avoid nephrotoxic ends -continue PO bicarb  4. Acute on chronic anemia secondary to kidney disease, questionable hematoma in the back -will transfuse one unit today -came in with hemoglobin of 9.2--- 7.0 -SCD -hold aspirin  5. Type II diabetes sliding scale insulin  6. Hyperlipidemia continue statins  7. Generalized weakness with multiple comorbidities-- physical therapy to see patient  Social worker for discharge planning I discussed at length with patient's wife that patient has multiple comorbidities and overall has a high risk of decline. It is very hard for patient's wife to accept her husband's illness. Palliative care will be considered if patient continues to decline.  Case discussed with Care Management/Social Worker. Management plans discussed with the patient, family and they are in agreement.  CODE STATUS: full  DVT Prophylaxis: SCD  TOTAL TIME TAKING  CARE OF THIS PATIENT: *30* minutes.  >50% time spent on counselling and coordination of care  POSSIBLE D/C IN *?* DAYS, DEPENDING ON CLINICAL CONDITION.  Note: This dictation was prepared with Dragon dictation along with smaller phrase technology. Any transcriptional errors that result from this process are unintentional.  Fritzi Mandes M.D on 07/29/2019 at 3:11 PM  Between 7am to 6pm - Pager - [phone]  After 6pm go to [email or web address] - password [email or web address] [facility]  Office  [phone]  CC: Primary care physician; Tracie Harrier, MDPatient ID: [name] [name], male   DOB: [birth year], 58 y.o.   MRN:  [id number]

## 2019-07-29 NOTE — Plan of Care (Signed)
  Problem: Education: Goal: Knowledge of General Education information will improve Description: Including pain rating scale, medication(s)/side effects and non-pharmacologic comfort measures Outcome: Progressing   Problem: Activity: Goal: Risk for activity intolerance will decrease Outcome: Progressing   Problem: Safety: Goal: Ability to remain free from injury will improve Outcome: Progressing   Problem: Clinical Measurements: Goal: Diagnostic test results will improve Outcome: Not Progressing Note: Patient's hemoglobin dropped to 7, received 1 unit of blood

## 2019-07-29 NOTE — Evaluation (Signed)
Physical Therapy Evaluation Patient Details Name: [name] [name] MRN: [id number] DOB: November 14, 1961 Today's Date: 07/29/2019   History of Present Illness  58 yo male with onset of SOB and ventricular tachycardia was admitted, has been given diuretic for CHF and pulm edema.  Pt has been having hypoxia, drops in BP and has become anemic with transfusion scheduled.  Also having back pain with possible infection, has demand ischemia.  PMHx:  lumbar DJD, L4 foraminal stenosis, cardiomegaly, L foot toe amputations, TEE, PVD, DM, CABG, CHF, CKD, HTN, MI, PN,   Clinical Impression  Pt was seen for evaluation of his tolerance for activity and MD in to report his spine is free of growths and can be moved as tolerated.  BP and sats were recorded as follow:  Supine BP 131/61 pulse 79 and O2 sat 93%;  Sitting BP 119/62, pulse 82 and O2 sats 85-90%;  Standing BP 92/57 and O2 sat 91 to 97% and pulse 79.  Follow acutely and recommend CIR as pt is going to want to resume independent mobility and driving, increase tolerance for all activity.    Follow Up Recommendations CIR    Equipment Recommendations  None recommended by PT    Recommendations for Other Services Rehab consult     Precautions / Restrictions Precautions Precautions: Fall Precaution Comments: monitor BP and O2 sats Required Braces or Orthoses: (does not wear protective shoe for L foot amputation) Restrictions Weight Bearing Restrictions: No      Mobility  Bed Mobility Overal bed mobility: Needs Assistance Bed Mobility: Supine to Sit;Sit to Supine     Supine to sit: Mod assist Sit to supine: Mod assist   General bed mobility comments: cues for body mechanics for low back pain  Transfers Overall transfer level: Needs assistance Equipment used: Rolling walker (2 wheeled);1 person hand held assist Transfers: Sit to/from Stand Sit to Stand: Mod assist         General transfer comment: mod to power up and noted drop in O2 sats to  85%, settled at 90%  Ambulation/Gait             General Gait Details: unable due to BP and sats  Stairs            Wheelchair Mobility    Modified Rankin (Stroke Patients Only)       Balance Overall balance assessment: Needs assistance;History of Falls Sitting-balance support: Feet supported Sitting balance-Leahy Scale: Fair     Standing balance support: Bilateral upper extremity supported;During functional activity Standing balance-Leahy Scale: Poor                               Pertinent Vitals/Pain Pain Assessment: Faces Faces Pain Scale: Hurts whole lot Pain Location: low back Pain Descriptors / Indicators: Aching;Grimacing Pain Intervention(s): Limited activity within patient's tolerance;Monitored during session;Repositioned;Premedicated before session;Patient requesting pain meds-RN notified    Home Living Family/patient expects to be discharged to:: Private residence Living Arrangements: Spouse/significant other Available Help at Discharge: Family;Friend(s);Available PRN/intermittently Type of Home: House Home Access: Stairs to enter Entrance Stairs-Rails: Right;Left(single rail can be reached only on either set) Entrance Stairs-Number of Steps: 4 or 5 Home Layout: One level Home Equipment: Walker - 2 wheels;Cane - single point      Prior Function Level of Independence: Independent with assistive device(s)         Comments: able to drive and out in the community, independent  Hand Dominance   Dominant Hand: Right    Extremity/Trunk Assessment   Upper Extremity Assessment Upper Extremity Assessment: Overall WFL for tasks assessed    Lower Extremity Assessment Lower Extremity Assessment: Overall WFL for tasks assessed    Cervical / Trunk Assessment Cervical / Trunk Assessment: Other exceptions(reddened area on the left lumbar spine)  Communication   Communication: No difficulties  Cognition Arousal/Alertness:  Lethargic Behavior During Therapy: Flat affect Overall Cognitive Status: Within Functional Limits for tasks assessed                                 General Comments: limited tolerance for mobility but limited conversation due to lethargy      General Comments General comments (skin integrity, edema, etc.): BP was steadily dropping during mobility, supine 131/61 to standing 92/57    Exercises     Assessment/Plan    PT Assessment Patient needs continued PT services  PT Problem List Decreased range of motion;Decreased activity tolerance;Decreased balance;Decreased mobility;Decreased coordination;Decreased safety awareness;Cardiopulmonary status limiting activity;Decreased skin integrity;Pain       PT Treatment Interventions DME instruction;Gait training;Stair training;Functional mobility training;Therapeutic activities;Therapeutic exercise;Balance training;Neuromuscular re-education;Patient/family education    PT Goals (Current goals can be found in the Care Plan section)  Acute Rehab PT Goals Patient Stated Goal: get pain managed PT Goal Formulation: With patient/family Time For Goal Achievement: 08/12/19 Potential to Achieve Goals: Good    Frequency Min 2X/week   Barriers to discharge Inaccessible home environment;Decreased caregiver support stairs to enter house and wife is working    Co-evaluation               AM-PAC PT "6 Clicks" Mobility  Outcome Measure Help needed turning from your back to your side while in a flat bed without using bedrails?: A Little Help needed moving from lying on your back to sitting on the side of a flat bed without using bedrails?: A Lot Help needed moving to and from a bed to a chair (including a wheelchair)?: A Lot Help needed standing up from a chair using your arms (e.g., wheelchair or bedside chair)?: A Lot Help needed to walk in hospital room?: A Lot Help needed climbing 3-5 steps with a railing? : Total 6 Click  Score: 12    End of Session Equipment Utilized During Treatment: Gait belt Activity Tolerance: Treatment limited secondary to medical complications (Comment);Patient limited by pain Patient left: in bed;with call bell/phone within reach;with bed alarm set;with family/visitor present Nurse Communication: Mobility status PT Visit Diagnosis: Other abnormalities of gait and mobility (R26.89);Unsteadiness on feet (R26.81);Ataxic gait (R26.0);Difficulty in walking, not elsewhere classified (R26.2);Dizziness and giddiness (R42)    Time: 8295-6213 PT Time Calculation (min) (ACUTE ONLY): 39 min   Charges:   PT Evaluation $PT Eval Moderate Complexity: 1 Mod PT Treatments $Therapeutic Exercise: 8-22 mins $Therapeutic Activity: 8-22 mins       Ramond Dial 07/29/2019, 7:55 PM   Mee Hives, PT MS Acute Rehab Dept. Number: [place]

## 2019-07-29 NOTE — Plan of Care (Signed)
  Problem: Education: Goal: Knowledge of General Education information will improve Description Including pain rating scale, medication(s)/side effects and non-pharmacologic comfort measures Outcome: Progressing   Problem: Clinical Measurements: Goal: Diagnostic test results will improve Outcome: Progressing Goal: Respiratory complications will improve Outcome: Progressing   

## 2019-07-29 NOTE — Progress Notes (Signed)
Pt had 7 beat run of Vtach at 0300. Pt was asymptomatic. Will continue to monitor.

## 2019-07-30 DIAGNOSIS — R222 Localized swelling, mass and lump, trunk: Secondary | ICD-10-CM

## 2019-07-30 DIAGNOSIS — Z8619 Personal history of other infectious and parasitic diseases: Secondary | ICD-10-CM

## 2019-07-30 DIAGNOSIS — Z951 Presence of aortocoronary bypass graft: Secondary | ICD-10-CM

## 2019-07-30 DIAGNOSIS — Z89422 Acquired absence of other left toe(s): Secondary | ICD-10-CM

## 2019-07-30 DIAGNOSIS — N179 Acute kidney failure, unspecified: Secondary | ICD-10-CM

## 2019-07-30 DIAGNOSIS — N189 Chronic kidney disease, unspecified: Secondary | ICD-10-CM

## 2019-07-30 DIAGNOSIS — I429 Cardiomyopathy, unspecified: Secondary | ICD-10-CM

## 2019-07-30 DIAGNOSIS — Z881 Allergy status to other antibiotic agents status: Secondary | ICD-10-CM

## 2019-07-30 DIAGNOSIS — Z8739 Personal history of other diseases of the musculoskeletal system and connective tissue: Secondary | ICD-10-CM

## 2019-07-30 DIAGNOSIS — K6282 Dysplasia of anus: Secondary | ICD-10-CM

## 2019-07-30 DIAGNOSIS — Z8631 Personal history of diabetic foot ulcer: Secondary | ICD-10-CM

## 2019-07-30 DIAGNOSIS — I2510 Atherosclerotic heart disease of native coronary artery without angina pectoris: Secondary | ICD-10-CM

## 2019-07-30 DIAGNOSIS — I5022 Chronic systolic (congestive) heart failure: Secondary | ICD-10-CM

## 2019-07-30 DIAGNOSIS — E1122 Type 2 diabetes mellitus with diabetic chronic kidney disease: Secondary | ICD-10-CM

## 2019-07-30 DIAGNOSIS — E1151 Type 2 diabetes mellitus with diabetic peripheral angiopathy without gangrene: Secondary | ICD-10-CM

## 2019-07-30 DIAGNOSIS — L84 Corns and callosities: Secondary | ICD-10-CM

## 2019-07-30 LAB — GLUCOSE, CAPILLARY
Glucose-Capillary: 132 mg/dL — ABNORMAL HIGH (ref 70–99)
Glucose-Capillary: 222 mg/dL — ABNORMAL HIGH (ref 70–99)
Glucose-Capillary: 147 mg/dL — ABNORMAL HIGH (ref 70–99)
Glucose-Capillary: 252 mg/dL — ABNORMAL HIGH (ref 70–99)

## 2019-07-30 LAB — TYPE AND SCREEN
ABO/RH(D): A POS
Antibody Screen: NEGATIVE
Unit division: 0

## 2019-07-30 LAB — BPAM RBC
Blood Product Expiration Date: 202009302359
ISSUE DATE / TIME: 202009121637
Unit Type and Rh: 6200

## 2019-07-30 LAB — HEMOGLOBIN: Hemoglobin: 7.7 g/dL — ABNORMAL LOW (ref 13.0–17.0)

## 2019-07-30 MED ORDER — CEFAZOLIN SODIUM-DEXTROSE 1-4 GM/50ML-% IV SOLN
1.0000 g | Freq: Two times a day (BID) | INTRAVENOUS | Status: DC
  Administered 2019-07-31 (×3): 1 g via INTRAVENOUS
  Filled 2019-07-30 (×6): qty 50

## 2019-07-30 MED ORDER — GABAPENTIN 100 MG PO CAPS
100.0000 mg | ORAL_CAPSULE | Freq: Three times a day (TID) | ORAL | Status: DC
  Administered 2019-07-30 – 2019-08-08 (×29): 100 mg via ORAL
  Filled 2019-07-30 (×27): qty 1

## 2019-07-30 NOTE — Progress Notes (Signed)
Pt had a 26 beat run of SVT with a HR in the 140s at 1256. Pt was asymptomatic. MD aware. Will continue to monitor.

## 2019-07-30 NOTE — Progress Notes (Signed)
Progress Note   Subjective   Doing well today, the patient denies CP.  Back pain is a little better.  SOB is improved with diuresis.  No new concerns  Inpatient Medications    Scheduled Meds: . atorvastatin  40 mg Oral QHS  . carvedilol  25 mg Oral BID WC  . docusate sodium  100 mg Oral BID  . famotidine  20 mg Oral QHS  . ferrous sulfate  325 mg Oral Daily  . furosemide  40 mg Intravenous Q12H  . gabapentin  100 mg Oral TID  . hydrALAZINE  25 mg Oral BID  . insulin aspart  0-5 Units Subcutaneous QHS  . insulin aspart  0-9 Units Subcutaneous TID WC  . isosorbide mononitrate  30 mg Oral Daily  . polyethylene glycol  17 g Oral Daily  . sodium bicarbonate  1,300 mg Oral BID   Continuous Infusions: . sodium chloride Stopped (07/29/19 2322)   PRN Meds: sodium chloride, acetaminophen **OR** acetaminophen, HYDROmorphone (DILAUDID) injection, nitroGLYCERIN, ondansetron **OR** ondansetron (ZOFRAN) IV, oxyCODONE   Vital Signs    Vitals:   07/29/19 1925 07/29/19 2050 07/30/19 0342 07/30/19 0736  BP: (!) 152/78 (!) 175/79 135/64 136/63  Pulse: 85 86 81 84  Resp: 18 20 18 16   Temp: 98.3 F (36.8 C) 98.9 F (37.2 C) 98.8 F (37.1 C) 99.2 F (37.3 C)  TempSrc: Oral Oral Oral Oral  SpO2: 96% 96% 92% 95%  Weight:   125 kg   Height:        Intake/Output Summary (Last 24 hours) at 07/30/2019 1226 Last data filed at 07/30/2019 1002 Gross per 24 hour  Intake 833.89 ml  Output 1425 ml  Net -591.11 ml   Filed Weights   07/28/19 0246 07/29/19 0352 07/30/19 0342  Weight: 124.4 kg 124.6 kg 125 kg    Telemetry    Sinus,  Occasional NSVT - Personally Reviewed  Physical Exam   GEN- The patient is ill appearing, alert and oriented x 3 today.   Head- normocephalic, atraumatic Eyes-  Sclera clear, conjunctiva pink Ears- hearing intact Oropharynx- clear Neck- supple, Lungs-  normal work of breathing Heart- Regular rate and rhythm  GI- distended Extremities- no clubbing,  cyanosis,+ dependant edema  MS- no significant deformity or atrophy Skin- no rash or lesion Psych- euthymic mood, full affect Neuro- strength and sensation are intact   Labs    Chemistry Recent Labs  Lab 07/27/19 1711 07/28/19 0547 07/29/19 0520  NA 134*  --  136  K 4.7  --  4.5  CL 106  --  106  CO2 20*  --  22  GLUCOSE 344*  --  153*  BUN 69*  --  66*  CREATININE 3.55* 3.39* 3.45*  CALCIUM 8.3*  --  7.8*  GFRNONAA 18* 19* 19*  GFRAA 21* 22* 22*  ANIONGAP 8  --  8     Hematology Recent Labs  Lab 07/27/19 1711 07/29/19 0520 07/30/19 0634  WBC 14.5* 12.4*  --   RBC 2.71* 2.40*  --   HGB 8.0* 7.0* 7.7*  HCT 25.0* 22.0*  --   MCV 92.3 91.7  --   MCH 29.5 29.2  --   MCHC 32.0 31.8  --   RDW 13.1 13.2  --   PLT 309 259  --     Patient Profile:   [name] Boyadjian Murphyis a 58 y.o.malewith a hx of CAD s/p four-vessel CABG (04/2014), HFrEF secondary to ICM, NSVT, paroxysmal SVT,  PVD, poorly controlled insulin-dependent diabetes with diabetic foot ulcers and diabetic peripheral neuropathyrequiring amputations of the left toes, osteomyelitis, CKD stage IV, hypertension, hyperlipidemia, asymptomatic orthostatic hypotension, medication compliance issues secondary to financial issues,andwho is being seen today for the evaluation ofshortness of breathat the request ofDr. Gouru.  Assessment & Plan    1.  CAD s/p prior CABG/ ischemic CM/ acute on chronic systolic dysfunction Continue current diuresis as able Resume home medicines as renal function allows Given multiple acute issues, including concerns for infection, not an ICD candidate at this time.  2. Hypertensive cardiorenal disease with CHF and stage IV renal disease Renal failure appears stable Continue current medical therapy  3. Stage IV renal failure Likely due to diabetic nephropathy Complicates CHF treatment  4. Anemia Primary team to manage  5. Spinal mass Neurosurgery following  Thompson Grayer MD,  [facility] 07/30/2019 12:26 PM

## 2019-07-30 NOTE — Progress Notes (Signed)
[place] [facility] [place] NAME: [name] [name]    MR#:  [id number]  DATE OF BIRTH:  25-Jul-1961  SUBJECTIVE:   Denies any complaints other than low back pain. He is able to move all extremities well. No bowel or urinary incontinence. He is able to stand and pivot to the bathroom with help. REVIEW OF SYSTEMS:   Review of Systems  Constitutional: Negative for chills, fever and weight loss.  HENT: Negative for ear discharge, ear pain and nosebleeds.   Eyes: Negative for blurred vision, pain and discharge.  Respiratory: Positive for shortness of breath. Negative for sputum production, wheezing and stridor.   Cardiovascular: Positive for leg swelling. Negative for chest pain, palpitations, orthopnea and PND.  Gastrointestinal: Negative for abdominal pain, diarrhea, nausea and vomiting.  Genitourinary: Negative for frequency and urgency.  Musculoskeletal: Positive for back pain. Negative for joint pain.  Neurological: Positive for weakness. Negative for sensory change, speech change and focal weakness.  Psychiatric/Behavioral: Negative for depression and hallucinations. The patient is not nervous/anxious.    Tolerating Diet:some Tolerating PT: rehab  DRUG ALLERGIES:   Allergies  Allergen Reactions  . Other Other (See Comments)    Pt. And wife state that he had a reaction to "some" antibiotic but they do not know what the name of it was.They are are very apprehensive about him receiving any antibiotic. Zosyn, Clindamycin, & Vancomycin (all started together unsure which caused reaction 07/18/2013)    VITALS:  Blood pressure 136/63, pulse 84, temperature 99.2 F (37.3 C), temperature source Oral, resp. rate 16, height 6\' 1"  (1.854 m), weight 125 kg, SpO2 95 %.  PHYSICAL EXAMINATION:   Physical Exam  GENERAL:  58 y.o.-year-old patient lying in the bed with no acute distress. He is obese, chronically ill EYES: Pupils equal, round, reactive to  light and accommodation. No scleral icterus. Extraocular muscles intact.  HEENT: Head atraumatic, normocephalic. Oropharynx and nasopharynx clear.  NECK:  Supple, no jugular venous distention. No thyroid enlargement, no tenderness.  LUNGS: decreased breath sounds bilaterally, no wheezing, rales, rhonchi. No use of accessory muscles of respiration.  CARDIOVASCULAR: S1, S2 normal. No murmurs, rubs, or gallops.  ABDOMEN: Soft, nontender, nondistended. Bowel sounds present. No organomegaly or mass.  EXTREMITIES: No cyanosis, clubbing  + edema b/l.    NEUROLOGIC: Cranial nerves II through XII are intact. No focal Motor or sensory deficits b/l. Some tenderness in the lower back. PSYCHIATRIC:  patient is alert and oriented x 3.  SKIN: No obvious rash, lesion, or ulcer.   LABORATORY PANEL:  CBC Recent Labs  Lab 07/29/19 0520 07/30/19 0634  WBC 12.4*  --   HGB 7.0* 7.7*  HCT 22.0*  --   PLT 259  --     Chemistries  Recent Labs  Lab 07/28/19 0303  07/29/19 0520  NA  --   --  136  K  --   --  4.5  CL  --   --  106  CO2  --   --  22  GLUCOSE  --   --  153*  BUN  --   --  66*  CREATININE  --    < > 3.45*  CALCIUM  --   --  7.8*  MG 2.3  --   --    < > = values in this interval not displayed.   Cardiac Enzymes No results for input(s): TROPONINI in the last 168 hours. RADIOLOGY:  Mr Lumbar Spine  Wo Contrast  Result Date: 07/28/2019 CLINICAL DATA:  58 year old male with back pain. Abnormal subcutaneous lumbar paraspinal soft tissues on CT yesterday. EXAM: MRI LUMBAR SPINE WITHOUT CONTRAST TECHNIQUE: Multiplanar, multisequence MR imaging of the lumbar spine was performed. No intravenous contrast was administered. COMPARISON:  Lumbar spine CT 07/27/2019. FINDINGS: The patient was scanned in the lateral decubitus position due to pain, and subsequently the sagittal images are somewhat oblique. Segmentation:  Normal on the CT yesterday. Alignment:  Straightening of lumbar lordosis.  No  spondylolisthesis. Vertebrae: No marrow edema or evidence of acute osseous abnormality. Visualized bone marrow signal is within normal limits. Intact visible sacrum and SI joints. Conus medullaris and cauda equina: Conus extends to the L1 level. No lower spinal cord or conus signal abnormality. Paraspinal and other soft tissues: Negative visible abdominal viscera. Large body habitus with abundant lumbar posterior subcutaneous edema. Overlying the L4 level to the right of midline there is a 7 centimeter area of confluent abnormal signal in the subcutaneous fat (series 9, image 17 and series 13, image 28). With associated inflammation in the underlying superficial aspect of the right lumbar rectus spine I muscles from L2-L3 inferiorly (series 14, images 20 through 28). No contrast was administered but there is no drainable fluid identified. No underlying bony changes are evident. Disc levels: T12-L1:  Negative. L1-L2:  Mild facet hypertrophy. L2-L3:  Mild facet hypertrophy. L3-L4:  Mild far lateral disc bulging.  No definite stenosis. L4-L5: Mild circumferential disc bulge. No spinal or lateral recess stenosis. Mild bilateral L4 foraminal stenosis probably greater on the left. L5-S1:  Negative disc.  Mild facet hypertrophy.  No stenosis. IMPRESSION: 1. Large round 7 centimeter nonspecific soft tissue lesion in the subcutaneous fat overlying the L4 vertebra to the right of midline. This could be an infectious phlegmon or a hematoma. There is underlying edema within the superficial aspect of the right erector spinae muscle from L2 to the sacrum, but no drainable fluid collection. There is abundant generalized subcutaneous edema throughout the lumbar spine. 2. No underlying acute osseous abnormality in the spine. 3. Relatively mild for age lumbar degeneration with no spinal stenosis and only mild bilateral L4 foraminal stenosis. Electronically Signed   By: Genevie Ann M.D.   On: 07/28/2019 16:21   ASSESSMENT AND PLAN:   Ian Castagna Murphyis a 58 y.o.malewith a hx of CAD s/p four-vessel CABG (04/2014), HFrEF secondary to ICM, NSVT, paroxysmal SVT, PVD, poorly controlled insulin-dependent diabetes with diabetic foot ulcers and diabetic peripheral neuropathyrequiring amputations of the left toes, osteomyelitis, CKD stage IV, hypertension, hyperlipidemia  1. Acute on chronic systolic dysfunction with history of ischemic cardiomyopathy -currently on IV Lasix 40 mg BID--UOP 4.5 liters. Clinically looks better. sats 95% on RA -monitor input output, creatinine 3.45 -appreciate cardiology and nephrology input -low sodium diet -assess for home oxygen -patient has history of coronary artery disease status post CABG  2. Low back pain acute -wife reports patient rolled over and fell off his bed few days prior to coming to the hospital. He is been laying in bed since and complains of acute low back pain. Patient denies any sensory symptoms bowel or urinary incontinence he is able to walk to the bathroom with assistance -MRI of the lumbar spine shows Large round 7 centimeter nonspecific soft tissue area  in the subcutaneous fat overlying the L4 vertebra to the right of midline. This could be an infectious phlegmon or a hematoma.--more favoring hemtoam per neurosx - ID consulted. No fever. Holding  IV abxs -patient is not spiking any fever. -I spoke with neurosurgery on call Dr. Vertell Limber and he has reviewed the MRI with me on 9/12/202 Dr. Vertell Limber does not feel any surgical intervention at present. This likely is hematoma with his recent fall and drop in hemoglobin-Will continue IV and PO pain meds.\ -I will discussed with nephrology to see if he can add small dose of Lyrica or gabapentin--100 mg tid -okay to start physical therapy per neurosurgery  3. Acute on chronic kidney disease stage IV -appreciate Dr. Assunta Gambles input -monitor input output and creatinine -avoid nephrotoxic ends -continue PO bicarb  4. Acute on chronic  anemia secondary to kidney disease, questionable hematoma in the back -will transfuse one unit today -came in with hemoglobin of 9.2--- 7.0-- one unit blood transfusion---7.7 -SCD -hold aspirin  5. Type II diabetes sliding scale insulin  6. Hyperlipidemia continue statins  7. Generalized weakness with multiple comorbidities-- physical therapy to see patient  Social worker for discharge planning I discussed at length with patient's wife that patient has multiple comorbidities and overall has a high risk of decline.Palliative care will be considered if patient continues to decline.  Spoke with Mrs. [name] on the phone and updated today.  Management plans discussed with the patient, family and they are in agreement.  CODE STATUS: full  DVT Prophylaxis: SCD  TOTAL TIME TAKING CARE OF THIS PATIENT: *30* minutes.  >50% time spent on counselling and coordination of care  POSSIBLE D/C IN *?* DAYS, DEPENDING ON CLINICAL CONDITION.  Note: This dictation was prepared with Dragon dictation along with smaller phrase technology. Any transcriptional errors that result from this process are unintentional.  Fritzi Mandes M.D on 07/30/2019 at 11:59 AM  Between 7am to 6pm - Pager - [phone]  After 6pm go to [email or web address] - password [email or web address] [facility]  Office  [phone]  CC: Primary care physician; Tracie Harrier, MDPatient ID: [name] [name], male   DOB: 1960/11/17, 58 y.o.   MRN: [id number]

## 2019-07-30 NOTE — TOC Progression Note (Signed)
Transition of Care [facility]) - Progression Note    Patient Details  Name: [name] [name] MRN: [id number] Date of Birth: 1961-09-04  Transition of Care [facility]) CM/SW Contact  Beverly Sessions, RN Phone Number: 07/30/2019, 1:15 PM  Clinical Narrative:     PT has assessed patient and recommends CIR.  Wife is adamant that she wants patient to go to CIR.  Patient in agreement.  They state he has been there before  Consult rehab md for ip rehab placed   Patient states that he has been to [facility] in the past and has been open with [facility] in the past.  Wife states right now they are not willing to consider SNF if CIR is not an option.  Wife states if patient is not for patient to go to CIR, she wants to return home with [place] to follow up with patient and wife once decision is made by CIR     Barriers to Discharge: Continued Medical Work up  Expected Discharge Plan and Services         Living arrangements for the past 2 months: Single Family Home                                       Social Determinants of Health (SDOH) Interventions    Readmission Risk Interventions No flowsheet data found.

## 2019-07-30 NOTE — Consult Note (Signed)
NAME: CARDALE DORER  DOB: 1961/06/29  MRN: [id number]  Date/Time: 07/30/2019 4:59 PM  REQUESTING PROVIDER: Dr. Posey Pronto Subjective:  REASON FOR CONSULT: Hematoma versus abscess of the spinal area ? History from patient and wife  LINCON SAHLIN is a 58 y.o. male with a history of diabetes mellitus, coronary artery disease, status post CABG, congestive heart failure cardiomyopathy, peripheral vascular disease, AIN due to beta-lactam, left TMA for diabetic foot ulcer, presents to the emerge use, diabetic foot ulcer left Presents to the emergency department on 07/27/2019 with shortness of breath and mid lower back pain for 2 weeks duration. He was also having concomitant lower extremity edema and he was using Lasix which was prescribed for swelling as needed.  Despite the use of diuretics the patient started to have increasing dyspnea.  He also was complaining of back pain and since there was point tenderness a CT scan of the lumbar spine was done and that showed new soft tissue edema and focal heterogeneous soft tissue density seen overlying L4-L5. The concern was whether this was  early hematoma versus phlegmon.  And an MRI with contrast was requested by radiologist. Patient was started on vancomycin and cefepime. He had an MRI done without contrast on 07/28/2019 and that showed 7 cm area of confluent abnormal signal in the subcutaneous fat at the 8 level of L4.  With some associated inflammation and underlying superficial aspect of the right lumbar rectus spinous muscle.  Again a hematoma versus an infectious phlegmon was questioned.  A neurosurgical consult was obtained and they reviewed the MRI and did not think the patient needed any surgical intervention.   I am asked to see the patient to rule out infection.    Patient was recently in [facility] between 07/10/2019 until 07/13/2023 diarrhea and had dehydration was an acute renal failure.  During that hospitalization the stool was negative for C. difficile  as well as for GI PCR.  He was then started on Imodium and the diarrhea improved.  GI was seeing the patient.  CT abdomen pelvis was negative.  GI wanted him to follow-up as outpatient.. As per patient and wife 3 days after discharge he started having back pain which got worse and he was in bed- last week when he tried to get up he slid off bed but did not hurt his back he says. Also the pain has been there even before he slid. He denies any fever He has no further diarrhea He has no insect bites Has a callus on the rt foot No trauma recently   Past Medical History:  Diagnosis Date   CAD (coronary artery disease)    a. 04/2014 CABG x 4 (LIMA->LAD, VG->Diag, VG->OM, VG->PDA).   Chronic systolic CHF (congestive heart failure) ([place])    a. 07/2014 Echo: EF 30-35%.   CKD (chronic kidney disease), stage III ([place])    a. 12/2015 Creat 1.8.   Diabetic foot ulcers ([place])    a. left foot 2nd digit ant 5 th digit 05/03/14; b. 08/2014 s/p amputation of toes on L foot.   Hypercholesterolemia    a. 12/2015 TC 116, TG 154, HDL 24, LDL 61-->Atorvastatin 40.   Hypertension    Hypertensive heart disease    Ischemic cardiomyopathy    a. 07/2013 Echo: EF 20%; b. 07/2014 Echo: EF 30-35%, diff HK, Gr1 DD, mildly dil LA, nl PASP.   Myocardial infarction ([place])    Neuropathy in diabetes ([place])    Open wound  foot   Orthostatic hypotension    Peripheral vascular disease (HCC)    Type II diabetes mellitus ([place])    a. 12/2015 HbA1c = 13.9.   MSSA bacteremia October 2015 Osteomyelitis of the left foot Transmetatarsal amputation October 2015 September 2014 was seen by ID Dr. Ola Spurr for foot ulcer and extensive cellulitis with possible osteomyelitis.  He was on Vanco and Zosyn as well as on clindamycin. On 07/21/2013 had IND of the left foot both the dorsal and plantar spaces.  And cultures were sent It had GBS, Prevotella and Enterococcus faecalis.  He was switched to Unasyn.  Creatinine went up to  5.74.  Concern for interstitial nephritis with Unasyn after renal biopsy was done and he was switched to Cipro and clindamycin.  01/22/2017 had percutaneous tendo Achilles lengthening right lower extremity.  Limited excisional debridement plantar right second MTPJ ulceration.  This was done for equinus deformity right lower extremity and superficial ulceration of the plantar right second MTP joint  Past Surgical History:  Procedure Laterality Date   ACHILLES TENDON SURGERY Right 01/22/2017   Procedure: ACHILLES LENGTHENING/KIDNER/TAL/Teno achilles lengthening;  Surgeon: Samara Deist, DPM;  Location: [facility];  Service: Podiatry;  Laterality: Right;   CARDIAC CATHETERIZATION     03/2014   CATARACT EXTRACTION     CORONARY ARTERY BYPASS GRAFT N/A 05/07/2014   Procedure: CORONARY ARTERY BYPASS GRAFTING (CABG);  Surgeon: Gaye Pollack, MD;  Location: [facility];  Service: Open Heart Surgery;  Laterality: N/A;  Times 4 using left internal mammary artery and endoscopically harvested right saphenous vein   INTRAOPERATIVE TRANSESOPHAGEAL ECHOCARDIOGRAM N/A 05/07/2014   Procedure: INTRAOPERATIVE TRANSESOPHAGEAL ECHOCARDIOGRAM;  Surgeon: Gaye Pollack, MD;  Location: [facility];  Service: Open Heart Surgery;  Laterality: N/A;   left foot osteomyelitis and wound after surgery     TOE AMPUTATION     Left 3 and 4 toes   TONSILECTOMY/ADENOIDECTOMY WITH MYRINGOTOMY      Social History   Socioeconomic History   Marital status: Married    Spouse name: Not on file   Number of children: Not on file   Years of education: Not on file   Highest education level: Not on file  Occupational History   Not on file  Social Needs   Financial resource strain: Not on file   Food insecurity    Worry: Not on file    Inability: Not on file   Transportation needs    Medical: Not on file    Non-medical: Not on file  Tobacco Use   Smoking status: Never Smoker   Smokeless tobacco: Never Used  Substance and  Sexual Activity   Alcohol use: No   Drug use: No   Sexual activity: Not on file  Lifestyle   Physical activity    Days per week: Not on file    Minutes per session: Not on file   Stress: Not on file  Relationships   Social connections    Talks on phone: Not on file    Gets together: Not on file    Attends religious service: Not on file    Active member of club or organization: Not on file    Attends meetings of clubs or organizations: Not on file    Relationship status: Not on file   Intimate partner violence    Fear of current or ex partner: Not on file    Emotionally abused: Not on file    Physically abused: Not on file  Forced sexual activity: Not on file  Other Topics Concern   Not on file  Social History Narrative   Not on file    Family History  Problem Relation Age of Onset   Heart disease Father        CABG in his 85's   Diabetes type II Other    Kidney disease Other    Hypertension Other    Ovarian cancer Mother    Allergies  Allergen Reactions   Other Other (See Comments)    Pt. And wife state that he had a reaction to "some" antibiotic but they do not know what the name of it was.They are are very apprehensive about him receiving any antibiotic. Zosyn, Clindamycin, & Vancomycin (all started together unsure which caused reaction 07/18/2013)  Allergic interstitial nephritis to unasyn  ? Current Facility-Administered Medications  Medication Dose Route Frequency Provider Last Rate Last Dose   0.9 %  sodium chloride infusion   Intravenous PRN Fritzi Mandes, MD   Stopped at 07/29/19 2322   acetaminophen (TYLENOL) tablet 650 mg  650 mg Oral Q6H PRN Harrie Foreman, MD       Or   acetaminophen (TYLENOL) suppository 650 mg  650 mg Rectal Q6H PRN Harrie Foreman, MD       atorvastatin (LIPITOR) tablet 40 mg  40 mg Oral QHS Harrie Foreman, MD   40 mg at 07/29/19 2059   carvedilol (COREG) tablet 25 mg  25 mg Oral BID WC Harrie Foreman, MD   25 mg at 07/30/19 1656   docusate sodium (COLACE) capsule 100 mg  100 mg Oral BID Harrie Foreman, MD   100 mg at 07/30/19 0817   famotidine (PEPCID) tablet 20 mg  20 mg Oral QHS Harrie Foreman, MD   20 mg at 07/29/19 2058   ferrous sulfate tablet 325 mg  325 mg Oral Daily Harrie Foreman, MD   325 mg at 07/30/19 0817   furosemide (LASIX) injection 40 mg  40 mg Intravenous Q12H Kolluru, Sarath, MD   40 mg at 07/30/19 1656   gabapentin (NEURONTIN) capsule 100 mg  100 mg Oral TID Fritzi Mandes, MD   100 mg at 07/30/19 1658   hydrALAZINE (APRESOLINE) tablet 25 mg  25 mg Oral BID Harrie Foreman, MD   25 mg at 07/30/19 0817   HYDROmorphone (DILAUDID) injection 1 mg  1 mg Intravenous Q3H PRN Minna Merritts, MD   1 mg at 07/29/19 2595   insulin aspart (novoLOG) injection 0-5 Units  0-5 Units Subcutaneous QHS Harrie Foreman, MD   2 Units at 07/28/19 2105   insulin aspart (novoLOG) injection 0-9 Units  0-9 Units Subcutaneous TID WC Harrie Foreman, MD   1 Units at 07/30/19 1656   isosorbide mononitrate (IMDUR) 24 hr tablet 30 mg  30 mg Oral Daily Harrie Foreman, MD   30 mg at 07/30/19 0816   nitroGLYCERIN (NITROSTAT) SL tablet 0.4 mg  0.4 mg Sublingual Q5 min PRN Harrie Foreman, MD       ondansetron [facility]) tablet 4 mg  4 mg Oral Q6H PRN Harrie Foreman, MD       Or   ondansetron [facility]) injection 4 mg  4 mg Intravenous Q6H PRN Harrie Foreman, MD       oxyCODONE (Oxy IR/ROXICODONE) immediate release tablet 10 mg  10 mg Oral Q4H PRN Gouru, Aruna, MD   10 mg at 07/30/19 1246  polyethylene glycol (MIRALAX / GLYCOLAX) packet 17 g  17 g Oral Daily Harrie Foreman, MD   17 g at 07/30/19 9935   sodium bicarbonate tablet 1,300 mg  1,300 mg Oral BID Harrie Foreman, MD   1,300 mg at 07/30/19 7017     Abtx:  Anti-infectives (From admission, onward)   Start     Dose/Rate Route Frequency Ordered Stop   07/29/19 2200  vancomycin (VANCOCIN) 1,500  mg in sodium chloride 0.9 % 500 mL IVPB  Status:  Discontinued     1,500 mg 250 mL/hr over 120 Minutes Intravenous Every 48 hours 07/28/19 0740 07/28/19 1225   07/29/19 2200  vancomycin (VANCOCIN) 1,750 mg in sodium chloride 0.9 % 500 mL IVPB  Status:  Discontinued     1,750 mg 250 mL/hr over 120 Minutes Intravenous Every 48 hours 07/28/19 1225 07/29/19 1256   07/29/19 2200  cefTRIAXone (ROCEPHIN) 1 g in sodium chloride 0.9 % 100 mL IVPB  Status:  Discontinued     1 g 200 mL/hr over 30 Minutes Intravenous Every 24 hours 07/29/19 1510 07/30/19 0750   07/28/19 2200  cefTRIAXone (ROCEPHIN) 2 g in sodium chloride 0.9 % 100 mL IVPB  Status:  Discontinued     2 g 200 mL/hr over 30 Minutes Intravenous Every 24 hours 07/28/19 0725 07/29/19 1256   07/28/19 0000  cefTRIAXone (ROCEPHIN) 2 g in sodium chloride 0.9 % 100 mL IVPB     2 g 200 mL/hr over 30 Minutes Intravenous  Once 07/27/19 2347 07/28/19 0043   07/28/19 0000  vancomycin (VANCOCIN) 2,000 mg in sodium chloride 0.9 % 500 mL IVPB     2,000 mg 250 mL/hr over 120 Minutes Intravenous  Once 07/27/19 2347 07/28/19 0301      REVIEW OF SYSTEMS:  Const: negative fever, negative chills, negative weight loss Eyes: negative diplopia or visual changes, negative eye pain ENT: negative coryza, negative sore throat Resp:  cough,  dyspnea Cards: negative for chest pain, palpitations, has lower extremity edema GU: negative for frequency, dysuria and hematuria GI: Negative for abdominal pain, diarrhea, bleeding, constipation Skin: negative for rash and pruritus Heme: negative for easy bruising and gum/nose bleeding MS: weakness, back pain Neurolo:negative for headaches, dizziness, vertigo, memory problems  Psych: negative for feelings of anxiety, depression  Endocrine:has DM Allergy/Immunology- AIN to unasyn Objective:  VITALS:  BP (!) 143/69 (BP Location: Left Arm)    Pulse 80    Temp 99.9 F (37.7 C) (Oral)    Resp 13    Ht 6\' 1"  (1.854 m)    Wt  125 kg    SpO2 90%    BMI 36.35 kg/m  PHYSICAL EXAM:  General: Alert, cooperative, no distress, chronically ill Head: Normocephalic, without obvious abnormality, atraumatic. Eyes: Conjunctivae clear, anicteric sclerae. Pupils are equal ENT Nares normal. No drainage or sinus tenderness. Lips, mucosa, and tongue normal. No Thrush poor dentition Neck: Supple, symmetrical, no adenopathy, thyroid: non tender no carotid bruit and no JVD. Back: lumbar area- a diffuse area of erythema, tenderness and induration     Lungs: b/l air entry- crepts Heart: s1s2 Sternal scar Abdomen: Soft, non-tender,not distended. Bowel sounds normal. No masses Extremities: art lateral margin near 5th toe- callus Left TMA Skin: No rashes or lesions. Or bruising Lymph: Cervical, supraclavicular normal. Neurologic: Grossly non-focal Pertinent Labs Lab Results CBC CBC Latest Ref Rng & Units 07/30/2019 07/29/2019 07/27/2019  WBC 4.0 - 10.5 K/uL - 12.4(H) 14.5(H)  Hemoglobin 13.0 - 17.0 g/dL  7.7(L) 7.0(L) 8.0(L)  Hematocrit 39.0 - 52.0 % - 22.0(L) 25.0(L)  Platelets 150 - 400 K/uL - 259 309      Component Value Date/Time   WBC 12.4 (H) 07/29/2019 0520   RBC 2.40 (L) 07/29/2019 0520   HGB 7.7 (L) 07/30/2019 0634   HGB 6.6 (L) 08/28/2014 0507   HCT 22.0 (L) 07/29/2019 0520   HCT 20.3 (L) 08/28/2014 0507   PLT 259 07/29/2019 0520   PLT 225 08/28/2014 0507   MCV 91.7 07/29/2019 0520   MCV 89 08/28/2014 0507   MCH 29.2 07/29/2019 0520   MCHC 31.8 07/29/2019 0520   RDW 13.2 07/29/2019 0520   RDW 13.3 08/28/2014 0507   LYMPHSABS 0.8 10/19/2018 1609   LYMPHSABS 1.6 08/28/2014 0507   MONOABS 0.6 10/19/2018 1609   MONOABS 0.9 08/28/2014 0507   EOSABS 0.2 10/19/2018 1609   EOSABS 0.2 08/28/2014 0507   BASOSABS 0.0 10/19/2018 1609   BASOSABS 0.0 08/28/2014 0507    CMP Latest Ref Rng & Units 07/29/2019 07/28/2019 07/27/2019  Glucose 70 - 99 mg/dL 153(H) - 344(H)  BUN 6 - 20 mg/dL 66(H) - 69(H)  Creatinine  0.61 - 1.24 mg/dL 3.45(H) 3.39(H) 3.55(H)  Sodium 135 - 145 mmol/L 136 - 134(L)  Potassium 3.5 - 5.1 mmol/L 4.5 - 4.7  Chloride 98 - 111 mmol/L 106 - 106  CO2 22 - 32 mmol/L 22 - 20(L)  Calcium 8.9 - 10.3 mg/dL 7.8(L) - 8.3(L)  Total Protein 6.5 - 8.1 g/dL - - -  Total Bilirubin 0.3 - 1.2 mg/dL - - -  Alkaline Phos 38 - 126 U/L - - -  AST 15 - 41 U/L - - -  ALT 0 - 44 U/L - - -      Microbiology: Recent Results (from the past 240 hour(s))  SARS Coronavirus 2 [facility] order, Performed in [facility] hospital lab) Nasopharyngeal Nasopharyngeal Swab     Status: None   Collection Time: 07/27/19 10:36 PM   Specimen: Nasopharyngeal Swab  Result Value Ref Range Status   SARS Coronavirus 2 NEGATIVE NEGATIVE Final    Comment: (NOTE) If result is NEGATIVE SARS-CoV-2 target nucleic acids are NOT DETECTED. The SARS-CoV-2 RNA is generally detectable in upper and lower  respiratory specimens during the acute phase of infection. The lowest  concentration of SARS-CoV-2 viral copies this assay can detect is 250  copies / mL. A negative result does not preclude SARS-CoV-2 infection  and should not be used as the sole basis for treatment or other  patient management decisions.  A negative result may occur with  improper specimen collection / handling, submission of specimen other  than nasopharyngeal swab, presence of viral mutation(s) within the  areas targeted by this assay, and inadequate number of viral copies  (<250 copies / mL). A negative result must be combined with clinical  observations, patient history, and epidemiological information. If result is POSITIVE SARS-CoV-2 target nucleic acids are DETECTED. The SARS-CoV-2 RNA is generally detectable in upper and lower  respiratory specimens dur ing the acute phase of infection.  Positive  results are indicative of active infection with SARS-CoV-2.  Clinical  correlation with patient history and other diagnostic information is    necessary to determine patient infection status.  Positive results do  not rule out bacterial infection or co-infection with other viruses. If result is PRESUMPTIVE POSTIVE SARS-CoV-2 nucleic acids MAY BE PRESENT.   A presumptive positive result was obtained on the submitted specimen  and  confirmed on repeat testing.  While 2019 novel coronavirus  (SARS-CoV-2) nucleic acids may be present in the submitted sample  additional confirmatory testing may be necessary for epidemiological  and / or clinical management purposes  to differentiate between  SARS-CoV-2 and other Sarbecovirus currently known to infect humans.  If clinically indicated additional testing with an alternate test  methodology [phone]) is advised. The SARS-CoV-2 RNA is generally  detectable in upper and lower respiratory sp ecimens during the acute  phase of infection. The expected result is Negative. Fact Sheet for Patients:  StrictlyIdeas.no Fact Sheet for Healthcare Providers: BankingDealers.co.za This test is not yet approved or cleared by the [place] FDA and has been authorized for detection and/or diagnosis of SARS-CoV-2 by FDA under an Emergency Use Authorization (EUA).  This EUA will remain in effect (meaning this test can be used) for the duration of the COVID-19 declaration under Section 564(b)(1) of the Act, 21 U.S.C. section 360bbb-3(b)(1), unless the authorization is terminated or revoked sooner. Performed at [facility], [address] [place], [place] [address]     IMAGING RESULTS:  I have personally reviewed the films  CT lumbar spine There is new significant soft tissue edema and heterogeneous soft tissue collection seen at L4-L5 in the posterior paraspinal soft tissues which could represent early phlegmon versus soft tissue hematoma  Echo 06/29/19 The left ventricle has moderate-severely reduced systolic function, with an ejection  fraction of 30-35%. The cavity size was normal. There is mildly increased left ventricular wall thickness. Left ventricular diastolic Doppler parameters are consistent with pseudonormalization. Left ventricular diffuse hypokinesis. 2. The right ventricle has normal systolic function. The cavity was normal. There is no increase in right ventricular wall thickness. Unable to estimate RVSP.  3. Left atrial size was moderately dilated. ? Impression/Recommendation ? 58 y.o. male with a history of diabetes mellitus, coronary artery disease, status post CABG, congestive heart failure cardiomyopathy, peripheral vascular disease, AIN due to beta-lactam, left TMA for diabetic foot ulcer, presents to the emerge use, diabetic foot ulcer left Presents to the emergency department on 07/27/2019 with shortness of breath and mid lower back pain for 2 weeks duration.  ? ?  Soft tissue swelling over lumbar area - With erythema, tenderness and tense lesion I am concerned that this could be an abscess VS infected hematoma- ( thought the latter is less likely as there was no injury and there is no bruising on the skin over the top) MRI and Ct shows subcutaneous tissue swelling- Neurosurgery reviewed MRI and did not think it was spine /disc issue He needs surgical consult. He will need I/D He was on vanco and cefepime on admission and then on ceftriaxone. Hemodynamically stable. Will send blood cultures and then start cefazolin to cover staph  AKI on CKD  CHF  CAD s/p CABG  DM   H/o MSSA bacteremia in Oct 2015  H/o TMA left for osteo and MSSa infection H/o AIN  Due to unasyn/zosyn     ___________________________________________________ Discussed with patient and his wife and  Dr.patel Sent secure chat message to surgeon on call Note:  This document was prepared using Dragon voice recognition software and may include unintentional dictation errors.

## 2019-07-30 NOTE — Progress Notes (Signed)
[facility]  ROUNDING NOTE   Subjective:   Laying in bed UOP 1775  No new creatinine for today  Objective:  Vital signs in last 24 hours:  Temp:  [98.3 F (36.8 C)-99.2 F (37.3 C)] 99.2 F (37.3 C) (09/13 0736) Pulse Rate:  [78-86] 84 (09/13 0736) Resp:  [16-20] 16 (09/13 0736) BP: (123-175)/(63-79) 136/63 (09/13 0736) SpO2:  [92 %-96 %] 95 % (09/13 0736) Weight:  [125 kg] 125 kg (09/13 0342)  Weight change: 0.318 kg Filed Weights   07/28/19 0246 07/29/19 0352 07/30/19 0342  Weight: 124.4 kg 124.6 kg 125 kg    Intake/Output: I/O last 3 completed shifts: In: 933.9 [P.O.:240; I.V.:13.9; Blood:480; IV VFIEPPIRJ:188] Out: 2675 [Urine:2675]   Intake/Output this shift:  Total I/O In: 0  Out: 475 [Urine:475]  Physical Exam: General: NAD,   Head: Normocephalic, atraumatic. Moist oral mucosal membranes  Eyes: Anicteric, PERRL  Neck: Supple, trachea midline  Lungs:  Clear to auscultation  Heart: Regular rate and rhythm  Abdomen:  Soft, nontender,   Extremities:  + peripheral edema.  Neurologic: Nonfocal, moving all four extremities  Skin: No lesions        Basic Metabolic Panel: Recent Labs  Lab 07/27/19 1711 07/28/19 0303 07/28/19 0547 07/29/19 0520  NA 134*  --   --  136  K 4.7  --   --  4.5  CL 106  --   --  106  CO2 20*  --   --  22  GLUCOSE 344*  --   --  153*  BUN 69*  --   --  66*  CREATININE 3.55*  --  3.39* 3.45*  CALCIUM 8.3*  --   --  7.8*  MG  --  2.3  --   --     Liver Function Tests: No results for input(s): AST, ALT, ALKPHOS, BILITOT, PROT, ALBUMIN in the last 168 hours. No results for input(s): LIPASE, AMYLASE in the last 168 hours. No results for input(s): AMMONIA in the last 168 hours.  CBC: Recent Labs  Lab 07/27/19 1711 07/29/19 0520 07/30/19 0634  WBC 14.5* 12.4*  --   HGB 8.0* 7.0* 7.7*  HCT 25.0* 22.0*  --   MCV 92.3 91.7  --   PLT 309 259  --     Cardiac Enzymes: No results for input(s): CKTOTAL,  CKMB, CKMBINDEX, TROPONINI in the last 168 hours.  BNP: Invalid input(s): POCBNP  CBG: Recent Labs  Lab 07/29/19 1205 07/29/19 1633 07/29/19 2048 07/30/19 0738 07/30/19 1133  GLUCAP 200* 172* 182* 132* 222*    Microbiology: Results for orders placed or performed during the hospital encounter of 07/27/19  SARS Coronavirus 2 [facility] order, Performed in [facility] hospital lab) Nasopharyngeal Nasopharyngeal Swab     Status: None   Collection Time: 07/27/19 10:36 PM   Specimen: Nasopharyngeal Swab  Result Value Ref Range Status   SARS Coronavirus 2 NEGATIVE NEGATIVE Final    Comment: (NOTE) If result is NEGATIVE SARS-CoV-2 target nucleic acids are NOT DETECTED. The SARS-CoV-2 RNA is generally detectable in upper and lower  respiratory specimens during the acute phase of infection. The lowest  concentration of SARS-CoV-2 viral copies this assay can detect is 250  copies / mL. A negative result does not preclude SARS-CoV-2 infection  and should not be used as the sole basis for treatment or other  patient management decisions.  A negative result may occur with  improper specimen collection / handling, submission of specimen  other  than nasopharyngeal swab, presence of viral mutation(s) within the  areas targeted by this assay, and inadequate number of viral copies  (<250 copies / mL). A negative result must be combined with clinical  observations, patient history, and epidemiological information. If result is POSITIVE SARS-CoV-2 target nucleic acids are DETECTED. The SARS-CoV-2 RNA is generally detectable in upper and lower  respiratory specimens dur ing the acute phase of infection.  Positive  results are indicative of active infection with SARS-CoV-2.  Clinical  correlation with patient history and other diagnostic information is  necessary to determine patient infection status.  Positive results do  not rule out bacterial infection or co-infection with other viruses. If  result is PRESUMPTIVE POSTIVE SARS-CoV-2 nucleic acids MAY BE PRESENT.   A presumptive positive result was obtained on the submitted specimen  and confirmed on repeat testing.  While 2019 novel coronavirus  (SARS-CoV-2) nucleic acids may be present in the submitted sample  additional confirmatory testing may be necessary for epidemiological  and / or clinical management purposes  to differentiate between  SARS-CoV-2 and other Sarbecovirus currently known to infect humans.  If clinically indicated additional testing with an alternate test  methodology [phone]) is advised. The SARS-CoV-2 RNA is generally  detectable in upper and lower respiratory sp ecimens during the acute  phase of infection. The expected result is Negative. Fact Sheet for Patients:  StrictlyIdeas.no Fact Sheet for Healthcare Providers: BankingDealers.co.za This test is not yet approved or cleared by the [place] FDA and has been authorized for detection and/or diagnosis of SARS-CoV-2 by FDA under an Emergency Use Authorization (EUA).  This EUA will remain in effect (meaning this test can be used) for the duration of the COVID-19 declaration under Section 564(b)(1) of the Act, 21 U.S.C. section 360bbb-3(b)(1), unless the authorization is terminated or revoked sooner. Performed at [facility], [address] [place], [place] [address]     Coagulation Studies: No results for input(s): LABPROT, INR in the last 72 hours.  Urinalysis: No results for input(s): COLORURINE, LABSPEC, PHURINE, GLUCOSEU, HGBUR, BILIRUBINUR, KETONESUR, PROTEINUR, UROBILINOGEN, NITRITE, LEUKOCYTESUR in the last 72 hours.  Invalid input(s): APPERANCEUR    Imaging: Mr Lumbar Spine Wo Contrast  Result Date: 07/28/2019 CLINICAL DATA:  58 year old male with back pain. Abnormal subcutaneous lumbar paraspinal soft tissues on CT yesterday. EXAM: MRI LUMBAR SPINE WITHOUT CONTRAST  TECHNIQUE: Multiplanar, multisequence MR imaging of the lumbar spine was performed. No intravenous contrast was administered. COMPARISON:  Lumbar spine CT 07/27/2019. FINDINGS: The patient was scanned in the lateral decubitus position due to pain, and subsequently the sagittal images are somewhat oblique. Segmentation:  Normal on the CT yesterday. Alignment:  Straightening of lumbar lordosis.  No spondylolisthesis. Vertebrae: No marrow edema or evidence of acute osseous abnormality. Visualized bone marrow signal is within normal limits. Intact visible sacrum and SI joints. Conus medullaris and cauda equina: Conus extends to the L1 level. No lower spinal cord or conus signal abnormality. Paraspinal and other soft tissues: Negative visible abdominal viscera. Large body habitus with abundant lumbar posterior subcutaneous edema. Overlying the L4 level to the right of midline there is a 7 centimeter area of confluent abnormal signal in the subcutaneous fat (series 9, image 17 and series 13, image 28). With associated inflammation in the underlying superficial aspect of the right lumbar rectus spine I muscles from L2-L3 inferiorly (series 14, images 20 through 28). No contrast was administered but there is no drainable fluid identified. No underlying bony changes  are evident. Disc levels: T12-L1:  Negative. L1-L2:  Mild facet hypertrophy. L2-L3:  Mild facet hypertrophy. L3-L4:  Mild far lateral disc bulging.  No definite stenosis. L4-L5: Mild circumferential disc bulge. No spinal or lateral recess stenosis. Mild bilateral L4 foraminal stenosis probably greater on the left. L5-S1:  Negative disc.  Mild facet hypertrophy.  No stenosis. IMPRESSION: 1. Large round 7 centimeter nonspecific soft tissue lesion in the subcutaneous fat overlying the L4 vertebra to the right of midline. This could be an infectious phlegmon or a hematoma. There is underlying edema within the superficial aspect of the right erector spinae muscle from  L2 to the sacrum, but no drainable fluid collection. There is abundant generalized subcutaneous edema throughout the lumbar spine. 2. No underlying acute osseous abnormality in the spine. 3. Relatively mild for age lumbar degeneration with no spinal stenosis and only mild bilateral L4 foraminal stenosis. Electronically Signed   By: Genevie Ann M.D.   On: 07/28/2019 16:21     Medications:   . sodium chloride Stopped (07/29/19 2322)   . atorvastatin  40 mg Oral QHS  . carvedilol  25 mg Oral BID WC  . docusate sodium  100 mg Oral BID  . famotidine  20 mg Oral QHS  . ferrous sulfate  325 mg Oral Daily  . furosemide  40 mg Intravenous Q12H  . gabapentin  100 mg Oral TID  . hydrALAZINE  25 mg Oral BID  . insulin aspart  0-5 Units Subcutaneous QHS  . insulin aspart  0-9 Units Subcutaneous TID WC  . isosorbide mononitrate  30 mg Oral Daily  . polyethylene glycol  17 g Oral Daily  . sodium bicarbonate  1,300 mg Oral BID   sodium chloride, acetaminophen **OR** acetaminophen, HYDROmorphone (DILAUDID) injection, nitroGLYCERIN, ondansetron **OR** ondansetron (ZOFRAN) IV, oxyCODONE  Assessment/ Plan:  [name] is a 58 y.o. white male with hypertension, diabetes mellitus type II, diabetic neuropathy, systolic congestive heart failure, peripheral vascular disease status post left toe amputations, autonomic neuropathy, coronary artery disease status post CABG, history of osteomyelitis, who has been admitted to [facility] on 07/27/2019 for acute exacerbation of congestive heart failure.   1. Acute renal failure on chronic kidney disease stage IV with nephrotic range proteinuria: baseline creatinine of 3.02, GFR of 22 on 07/13/2019.  Chronic kidney disease secondary to diabetic nephropathy. Chronic kidney disease complicated with hyperkalemia, metabolic acidosis and hypoalbuminemia.  Acute renal failure secondary to acute cardiorenal syndrome.  - Continue sodium bicarbonate.   2. Hypertension and acute  exacerbation of chronic systolic congestive heart failure. Home regimen of losartan, carvedilol, hydralazine, furosemide, and isosorbide mononitrate - IV furosemide  3. Anemia with chronic kidney disease: hemoglobin 7.7, normocytic.   4. Diabetes mellitus type II with chronic kidney disease: hemoglobin A1c of 6.9% 05/23/2019.   5. Spinal mass: L4 mass: 7cm on MRI. Neurosurgery has discussed findings with primary care team.    LOS: 2 [name] [name] 9/13/202011:50 AM

## 2019-07-30 NOTE — Plan of Care (Signed)

## 2019-07-30 NOTE — Progress Notes (Signed)
Pt had a 6 beat run of Vtach. Pt is asymptomatic. Nurse will continue to monitor.

## 2019-07-31 ENCOUNTER — Inpatient Hospital Stay: Payer: Medicare HMO

## 2019-07-31 DIAGNOSIS — L03312 Cellulitis of back [any part except buttock]: Secondary | ICD-10-CM

## 2019-07-31 DIAGNOSIS — I509 Heart failure, unspecified: Secondary | ICD-10-CM

## 2019-07-31 DIAGNOSIS — L089 Local infection of the skin and subcutaneous tissue, unspecified: Secondary | ICD-10-CM

## 2019-07-31 LAB — GLUCOSE, CAPILLARY
Glucose-Capillary: 137 mg/dL — ABNORMAL HIGH (ref 70–99)
Glucose-Capillary: 149 mg/dL — ABNORMAL HIGH (ref 70–99)
Glucose-Capillary: 185 mg/dL — ABNORMAL HIGH (ref 70–99)
Glucose-Capillary: 180 mg/dL — ABNORMAL HIGH (ref 70–99)

## 2019-07-31 LAB — RENAL FUNCTION PANEL
Albumin: 1.9 g/dL — ABNORMAL LOW (ref 3.5–5.0)
Anion gap: 10 (ref 5–15)
BUN: 74 mg/dL — ABNORMAL HIGH (ref 6–20)
CO2: 23 mmol/L (ref 22–32)
Calcium: 7.8 mg/dL — ABNORMAL LOW (ref 8.9–10.3)
Chloride: 100 mmol/L (ref 98–111)
Creatinine, Ser: 4.43 mg/dL — ABNORMAL HIGH (ref 0.61–1.24)
GFR calc Af Amer: 16 mL/min — ABNORMAL LOW (ref >60)
GFR calc non Af Amer: 14 mL/min — ABNORMAL LOW (ref >60)
Glucose, Bld: 140 mg/dL — ABNORMAL HIGH (ref 70–99)
Phosphorus: 6.2 mg/dL — ABNORMAL HIGH (ref 2.5–4.6)
Potassium: 4.9 mmol/L (ref 3.5–5.1)
Sodium: 133 mmol/L — ABNORMAL LOW (ref 135–145)

## 2019-07-31 LAB — POTASSIUM: Potassium: 4.8 mmol/L (ref 3.5–5.1)

## 2019-07-31 LAB — MAGNESIUM: Magnesium: 2.1 mg/dL (ref 1.7–2.4)

## 2019-07-31 MED ORDER — EPOETIN ALFA 10000 UNIT/ML IJ SOLN
10000.0000 [IU] | INTRAMUSCULAR | Status: DC
  Administered 2019-07-31 – 2019-08-02 (×2): 10000 [IU] via SUBCUTANEOUS
  Filled 2019-07-31 (×3): qty 1

## 2019-07-31 MED ORDER — FUROSEMIDE 40 MG PO TABS: 40.0000 mg | ORAL_TABLET | Freq: Every day | ORAL | Status: DC

## 2019-07-31 NOTE — Procedures (Signed)
Interventional Radiology Procedure Note  Procedure: US guided aspiration superficial soft tissues of low back. Tine amount of bloody fluid aspirated & sent for culture.   Complications: None  Estimated Blood Loss: NONE  Recommendations: - Cultures pending - Bedrest x 1 hr  Signed,  Criselda Peaches, MD

## 2019-07-31 NOTE — Progress Notes (Signed)
Rehab Admissions Coordinator Note:  Per PT recommendation, this patient was screened by Jhonnie Garner for appropriateness for an Inpatient Acute Rehab Consult.  We will follow for progress with therapy. At this time, pt does not demonstrate ability to tolerate intensive rehab program. Will watch for follow up treatment sessions to see if his activity tolerance improves.   Jhonnie Garner 07/31/2019, 8:08 AM  I can be reached at [phone].

## 2019-07-31 NOTE — Consult Note (Signed)
[facility] SURGICAL CONSULTATION NOTE (initial) - cpt: 51025   HISTORY OF PRESENT ILLNESS (HPI):  58 y.o. male presented to [facility] ED 09/10 for evaluation of weakness and SOB. Patient reported increasing SOB and peripheral edema despite use of Lasix. At that time, he also reported lower midline back pain over the last few days which he attributed to "picking something up." No reported trauma per patient although on chart review wife notes a recent fall from bed. This pain caused him to be bed bound and less mobile. On CT, he was found to having swelling in his lumbar spine which was concerning for abscess vs hematoma. Follow up MRI revealed the same. MRI reviewed by neurosurgery. Patient also evaluated by ID. No growth in BCx to this point. Patient still complaining of lower back pain, worse with laying on it. Relief with laying on side. No fevers. Leukocytosis on admission improving. No other new complaints this morning.   Surgery is consulted by hospitalist physician Dr. Posey Pronto in this context for evaluation and management of possible lower back cellulitis vs abscess vs infected hematoma.   PAST MEDICAL HISTORY (PMH):  Past Medical History:  Diagnosis Date  . CAD (coronary artery disease)    a. 04/2014 CABG x 4 (LIMA->LAD, VG->Diag, VG->OM, VG->PDA).  . Chronic systolic CHF (congestive heart failure) ([place])    a. 07/2014 Echo: EF 30-35%.  . CKD (chronic kidney disease), stage III ([place])    a. 12/2015 Creat 1.8.  . Diabetic foot ulcers ([place])    a. left foot 2nd digit ant 5 th digit 05/03/14; b. 08/2014 s/p amputation of toes on L foot.  . Hypercholesterolemia    a. 12/2015 TC 116, TG 154, HDL 24, LDL 61-->Atorvastatin 40.  Marland Kitchen Hypertension   . Hypertensive heart disease   . Ischemic cardiomyopathy    a. 07/2013 Echo: EF 20%; b. 07/2014 Echo: EF 30-35%, diff HK, Gr1 DD, mildly dil LA, nl PASP.  Marland Kitchen Myocardial infarction ([place])   . Neuropathy in diabetes ([place])   . Open wound    foot  .  Orthostatic hypotension   . Peripheral vascular disease ([place])   . Type II diabetes mellitus ([place])    a. 12/2015 HbA1c = 13.9.     PAST SURGICAL HISTORY ([place]):  Past Surgical History:  Procedure Laterality Date  . ACHILLES TENDON SURGERY Right 01/22/2017   Procedure: ACHILLES LENGTHENING/KIDNER/TAL/Teno achilles lengthening;  Surgeon: Samara Deist, DPM;  Location: [facility] ORS;  Service: Podiatry;  Laterality: Right;  . CARDIAC CATHETERIZATION     03/2014  . CATARACT EXTRACTION    . CORONARY ARTERY BYPASS GRAFT N/A 05/07/2014   Procedure: CORONARY ARTERY BYPASS GRAFTING (CABG);  Surgeon: Gaye Pollack, MD;  Location: [place];  Service: Open Heart Surgery;  Laterality: N/A;  Times 4 using left internal mammary artery and endoscopically harvested right saphenous vein  . INTRAOPERATIVE TRANSESOPHAGEAL ECHOCARDIOGRAM N/A 05/07/2014   Procedure: INTRAOPERATIVE TRANSESOPHAGEAL ECHOCARDIOGRAM;  Surgeon: Gaye Pollack, MD;  Location: [facility] OR;  Service: Open Heart Surgery;  Laterality: N/A;  . left foot osteomyelitis and wound after surgery    . TOE AMPUTATION     Left 3 and 4 toes  . TONSILECTOMY/ADENOIDECTOMY WITH MYRINGOTOMY       MEDICATIONS:  Prior to Admission medications   Medication Sig Start Date End Date Taking? Authorizing Provider  aspirin EC 81 MG tablet Take 81 mg by mouth daily.   Yes [provider]  atorvastatin (LIPITOR) 40 MG tablet Take 1  tablet (40 mg total) by mouth at bedtime. 11/23/18  Yes Gollan, Kathlene November, MD  carvedilol (COREG) 25 MG tablet Take 1 tablet (25 mg total) by mouth 2 (two) times daily with a meal. Patient taking differently: Take 25 mg by mouth at bedtime.  11/23/18  Yes Gollan, Kathlene November, MD  Cyanocobalamin (VITAMIN B 12 PO) Take 1 tablet by mouth at bedtime.    Yes [provider]  famotidine (PEPCID) 20 MG tablet TAKE 1 TABLET BY MOUTH EVERY DAY Patient taking differently: Take 20 mg by mouth at bedtime.  07/18/19  Yes Tahiliani, Margretta Sidle B, MD   ferrous sulfate 325 (65 FE) MG tablet Take 325 mg by mouth daily.   Yes [provider]  glimepiride (AMARYL) 4 MG tablet Take 4 mg by mouth 2 (two) times daily with a meal.  11/20/16  Yes [provider]  hydrALAZINE (APRESOLINE) 25 MG tablet Take 1 tablet (25 mg total) by mouth 2 (two) times daily. 07/06/19  Yes Dunn, Areta Haber, PA-C  isosorbide mononitrate (IMDUR) 60 MG 24 hr tablet Take 0.5 tablets (30 mg total) by mouth daily. 07/06/19  Yes Dunn, Areta Haber, PA-C  losartan (COZAAR) 25 MG tablet Take 25 mg by mouth at bedtime.  05/30/19 05/29/20 Yes [provider]  nitroGLYCERIN (NITROSTAT) 0.4 MG SL tablet Place 1 tablet (0.4 mg total) under the tongue every 5 (five) minutes as needed for chest pain. 12/24/15  Yes Theora Gianotti, NP  sodium bicarbonate 650 MG tablet Take 1,300 mg by mouth 2 (two) times daily.  03/01/19  Yes [provider]  HYDROcodone-acetaminophen (NORCO/VICODIN) 5-325 MG tablet Take by mouth as needed. 05/30/19   [provider]  polyethylene glycol (MIRALAX) 17 g packet Take 17 g by mouth daily. Patient not taking: Reported on 07/27/2019 07/18/19   Virgel Manifold, MD     ALLERGIES:  Allergies  Allergen Reactions  . Other Anaphylaxis and Other (See Comments)    Pt. And wife state that he had a reaction to "some" antibiotic but they do not know what the name of it was.They are are very apprehensive about him receiving any antibiotic. Zosyn, Clindamycin, & Vancomycin (all started together unsure which caused reaction 07/18/2013) 07/30/19- Allergic interstitial nephritis to Unasyn/zosyn in 2014 needing renal biopsy      SOCIAL HISTORY:  Social History   Socioeconomic History  . Marital status: Married    Spouse name: Not on file  . Number of children: Not on file  . Years of education: Not on file  . Highest education level: Not on file  Occupational History  . Not on file  Social Needs  . Financial resource strain: Not  on file  . Food insecurity    Worry: Not on file    Inability: Not on file  . Transportation needs    Medical: Not on file    Non-medical: Not on file  Tobacco Use  . Smoking status: Never Smoker  . Smokeless tobacco: Never Used  Substance and Sexual Activity  . Alcohol use: No  . Drug use: No  . Sexual activity: Not on file  Lifestyle  . Physical activity    Days per week: Not on file    Minutes per session: Not on file  . Stress: Not on file  Relationships  . Social Herbalist on phone: Not on file    Gets together: Not on file    Attends religious service: Not on file  Active member of club or organization: Not on file    Attends meetings of clubs or organizations: Not on file    Relationship status: Not on file  . Intimate partner violence    Fear of current or ex partner: Not on file    Emotionally abused: Not on file    Physically abused: Not on file    Forced sexual activity: Not on file  Other Topics Concern  . Not on file  Social History Narrative  . Not on file     FAMILY HISTORY:  Family History  Problem Relation Age of Onset  . Heart disease Father        CABG in his 47's  . Diabetes type II Other   . Kidney disease Other   . Hypertension Other   . Ovarian cancer Mother       REVIEW OF SYSTEMS:  Review of Systems  Constitutional: Negative for chills and fever.  Gastrointestinal: Negative for abdominal pain, nausea and vomiting.  Genitourinary: Negative for dysuria and urgency.  Musculoskeletal: Positive for back pain and falls.  All other systems reviewed and are negative.   VITAL SIGNS:  Temp:  [98.4 F (36.9 C)-99.9 F (37.7 C)] 98.4 F (36.9 C) (09/14 0330) Pulse Rate:  [76-84] 77 (09/14 0330) Resp:  [13-18] 18 (09/14 0330) BP: (103-143)/(63-73) 124/71 (09/14 0330) SpO2:  [85 %-96 %] 96 % (09/14 0330) Weight:  [125.6 kg] 125.6 kg (09/14 0330)     Height: 6\' 1"  (185.4 cm) Weight: 125.6 kg BMI (Calculated): 36.55    INTAKE/OUTPUT:  09/13 0701 - 09/14 0700 In: 530.4 [P.O.:480; I.V.:0.4; IV Piggyback:50] Out: 950 [Urine:950]  PHYSICAL EXAM:  Physical Exam Vitals signs and nursing note reviewed.  Constitutional:      General: He is not in acute distress.    Appearance: He is well-developed. He is obese. He is not ill-appearing.  HENT:     Head: Normocephalic and atraumatic.  Cardiovascular:     Rate and Rhythm: Normal rate and regular rhythm.     Pulses: Normal pulses.     Heart sounds: No murmur. No friction rub. No gallop.   Pulmonary:     Effort: Pulmonary effort is normal. No tachypnea.     Breath sounds: Normal breath sounds. No decreased breath sounds.  Genitourinary:    Comments: Deferred Skin:    General: Skin is warm and dry.     Findings: Erythema present.       Neurological:     General: No focal deficit present.     Mental Status: He is alert and oriented to person, place, and time.  Psychiatric:        Mood and Affect: Mood normal.        Behavior: Behavior normal.      Labs:  CBC Latest Ref Rng & Units 07/30/2019 07/29/2019 07/27/2019  WBC 4.0 - 10.5 K/uL - 12.4(H) 14.5(H)  Hemoglobin 13.0 - 17.0 g/dL 7.7(L) 7.0(L) 8.0(L)  Hematocrit 39.0 - 52.0 % - 22.0(L) 25.0(L)  Platelets 150 - 400 K/uL - 259 309   CMP Latest Ref Rng & Units 07/31/2019 07/29/2019 07/28/2019  Glucose 70 - 99 mg/dL - 153(H) -  BUN 6 - 20 mg/dL - 66(H) -  Creatinine 0.61 - 1.24 mg/dL - 3.45(H) 3.39(H)  Sodium 135 - 145 mmol/L - 136 -  Potassium 3.5 - 5.1 mmol/L 4.8 4.5 -  Chloride 98 - 111 mmol/L - 106 -  CO2 22 - 32 mmol/L - 22 -  Calcium 8.9 - 10.3 mg/dL - 7.8(L) -  Total Protein 6.5 - 8.1 g/dL - - -  Total Bilirubin 0.3 - 1.2 mg/dL - - -  Alkaline Phos 38 - 126 U/L - - -  AST 15 - 41 U/L - - -  ALT 0 - 44 U/L - - -     Imaging studies:   MRI Lumbar Spine (07/28/2019) personally reviewed and radiologist report reviewed:   IMPRESSION: 1. Large round 7 centimeter nonspecific soft tissue  lesion in the subcutaneous fat overlying the L4 vertebra to the right of midline. This could be an infectious phlegmon or a hematoma. There is underlying edema within the superficial aspect of the right erector spinae muscle from L2 to the sacrum, but no drainable fluid collection.  There is abundant generalized subcutaneous edema throughout the lumbar spine. 2. No underlying acute osseous abnormality in the spine. 3. Relatively mild for age lumbar degeneration with no spinal stenosis and only mild bilateral L4 foraminal stenosis.  CT Lumbar Spine (07/27/2019) personally reviewed and radiologist report reviewed:  IMPRESSION: There is new significant soft tissue edema and heterogeneous soft tissue collection seen at L4-L5 in the posterior paraspinal soft tissues which could represent early phlegmon versus soft tissue hematoma given the patient's history. If further evaluation is required would recommend MRI with contrast.  These results were called by telephone at the time of interpretation on 07/27/2019 at 11:29 pm to provider PHILLIP STAFFORD , who verbally acknowledged these results.    Assessment/Plan: (ICD-10's: L18.312) 58 y.o. male with what clinically appears to be cellulitis to lower back WITHOUT appreciable abscess on examination and no evidence of necrotizing infection, complicated by multiple comorbid conditions   - No indication for I&D or surgical intervention at this time  - If he worsens or fails to improve with ABx, we can consider repeat imaging in a day or two  - Continue IV ABx  - Could consider US guided aspiration in attempt to obtain culture/further evaluate this area  - Will leave NPO for now, if no IR procedure then okay to resume diet  - pain control prn  - follow up BCx; ID recommendations  - further management per primary service; d/w Dr Posey Pronto via secure chat  All of the above findings and recommendations were discussed with the patient, and all of  patient's questions were answered to his expressed satisfaction.  Thank you for the opportunity to participate in this patient's care.   -- Edison Simon, PA-C [facility] 07/31/2019, 7:30 AM [phone] M-F: 7am - 4pm

## 2019-07-31 NOTE — Progress Notes (Signed)
[facility], [place] 07/31/19  Subjective:   LOS: 3 09/13 0701 - 09/14 0700 In: 530.4 [P.O.:480; I.V.:0.4; IV Piggyback:50] Out: 1550 [[id number]] Awaiting US guided drainange of fluid collections Reports poor appetite No SOB Not able to walk because of pain   Objective:  Vital signs in last 24 hours:  Temp:  [98 F (36.7 C)-99.9 F (37.7 C)] 98 F (36.7 C) (09/14 0747) Pulse Rate:  [76-80] 78 (09/14 0747) Resp:  [13-19] 19 (09/14 0747) BP: (103-143)/(60-73) 140/60 (09/14 0747) SpO2:  [85 %-96 %] 94 % (09/14 0747) Weight:  [125.6 kg] 125.6 kg (09/14 0330)  Weight change: 0.68 kg Filed Weights   07/29/19 0352 07/30/19 0342 07/31/19 0330  Weight: 124.6 kg 125 kg 125.6 kg    Intake/Output:    Intake/Output Summary (Last 24 hours) at 07/31/2019 1016 Last data filed at 07/31/2019 0700 Gross per 24 hour  Intake 530.42 ml  Output 1075 ml  Net -544.58 ml   Physical Exam: Gen:   Alert, cooperative, no distress, appears stated age Head:   Normocephalic, without obvious abnormality, atraumatic Eyes/ENT:  conjunctiva/corneas clear,  moist oral mucus membranes Neck:  Supple,   Lungs:   Clear to auscultation bilaterally, respirations unlabored Heart:   No rub  Abdomen:   Soft, non-tender  Extremities: no cyanosis , 1+ b/l edema Skin:  Skin color, texture, turgor normal, no rashes or lesions Neurologic: Alert and oriented, able to answer questions appropriately    Basic Metabolic Panel:  Recent Labs  Lab 07/27/19 1711 07/28/19 0303 07/28/19 0547 07/29/19 0520 07/31/19 0524  NA 134*  --   --  136  --   K 4.7  --   --  4.5 4.8  CL 106  --   --  106  --   CO2 20*  --   --  22  --   GLUCOSE 344*  --   --  153*  --   BUN 69*  --   --  66*  --   CREATININE 3.55*  --  3.39* 3.45*  --   CALCIUM 8.3*  --   --  7.8*  --   MG  --  2.3  --   --  2.1     CBC: Recent Labs  Lab 07/27/19 1711 07/29/19 0520 07/30/19 0634  WBC 14.5* 12.4*   --   HGB 8.0* 7.0* 7.7*  HCT 25.0* 22.0*  --   MCV 92.3 91.7  --   PLT 309 259  --      No results found for: HEPBSAG, HEPBSAB, HEPBIGM    Microbiology:  Recent Results (from the past 240 hour(s))  SARS Coronavirus 2 [facility] order, Performed in [facility] hospital lab) Nasopharyngeal Nasopharyngeal Swab     Status: None   Collection Time: 07/27/19 10:36 PM   Specimen: Nasopharyngeal Swab  Result Value Ref Range Status   SARS Coronavirus 2 NEGATIVE NEGATIVE Final    Comment: (NOTE) If result is NEGATIVE SARS-CoV-2 target nucleic acids are NOT DETECTED. The SARS-CoV-2 RNA is generally detectable in upper and lower  respiratory specimens during the acute phase of infection. The lowest  concentration of SARS-CoV-2 viral copies this assay can detect is 250  copies / mL. A negative result does not preclude SARS-CoV-2 infection  and should not be used as the sole basis for treatment or other  patient management decisions.  A negative result may occur with  improper specimen collection / handling, submission of specimen other  than nasopharyngeal swab, presence of viral mutation(s) within the  areas targeted by this assay, and inadequate number of viral copies  (<250 copies / mL). A negative result must be combined with clinical  observations, patient history, and epidemiological information. If result is POSITIVE SARS-CoV-2 target nucleic acids are DETECTED. The SARS-CoV-2 RNA is generally detectable in upper and lower  respiratory specimens dur ing the acute phase of infection.  Positive  results are indicative of active infection with SARS-CoV-2.  Clinical  correlation with patient history and other diagnostic information is  necessary to determine patient infection status.  Positive results do  not rule out bacterial infection or co-infection with other viruses. If result is PRESUMPTIVE POSTIVE SARS-CoV-2 nucleic acids MAY BE PRESENT.   A presumptive positive result was  obtained on the submitted specimen  and confirmed on repeat testing.  While 2019 novel coronavirus  (SARS-CoV-2) nucleic acids may be present in the submitted sample  additional confirmatory testing may be necessary for epidemiological  and / or clinical management purposes  to differentiate between  SARS-CoV-2 and other Sarbecovirus currently known to infect humans.  If clinically indicated additional testing with an alternate test  methodology [phone]) is advised. The SARS-CoV-2 RNA is generally  detectable in upper and lower respiratory sp ecimens during the acute  phase of infection. The expected result is Negative. Fact Sheet for Patients:  StrictlyIdeas.no Fact Sheet for Healthcare Providers: BankingDealers.co.za This test is not yet approved or cleared by the [place] FDA and has been authorized for detection and/or diagnosis of SARS-CoV-2 by FDA under an Emergency Use Authorization (EUA).  This EUA will remain in effect (meaning this test can be used) for the duration of the COVID-19 declaration under Section 564(b)(1) of the Act, 21 U.S.C. section 360bbb-3(b)(1), unless the authorization is terminated or revoked sooner. Performed at [facility], [address] [place], [place] [address]   CULTURE, BLOOD (ROUTINE X 2) w Reflex to ID Panel     Status: None (Preliminary result)   Collection Time: 07/30/19  6:55 PM   Specimen: BLOOD  Result Value Ref Range Status   Specimen Description BLOOD RIGHT ANTECUBITAL  Final   Special Requests   Final    BOTTLES DRAWN AEROBIC AND ANAEROBIC Blood Culture adequate volume   Culture   Final    NO GROWTH < 12 HOURS Performed at [facility], [address], [place], [place] [address]    Report Status PENDING  Incomplete  CULTURE, BLOOD (ROUTINE X 2) w Reflex to ID Panel     Status: None (Preliminary result)   Collection Time: 07/30/19  6:55 PM   Specimen: BLOOD   Result Value Ref Range Status   Specimen Description BLOOD RIGHT HAND  Final   Special Requests   Final    BOTTLES DRAWN AEROBIC AND ANAEROBIC Blood Culture adequate volume   Culture   Final    NO GROWTH < 12 HOURS Performed at [facility], [address] [place], [place] [address]    Report Status PENDING  Incomplete    Coagulation Studies: No results for input(s): LABPROT, INR in the last 72 hours.  Urinalysis: No results for input(s): COLORURINE, LABSPEC, PHURINE, GLUCOSEU, HGBUR, BILIRUBINUR, KETONESUR, PROTEINUR, UROBILINOGEN, NITRITE, LEUKOCYTESUR in the last 72 hours.  Invalid input(s): APPERANCEUR    Imaging: No results found.   Medications:   . sodium chloride 250 mL (07/31/19 0954)  .  ceFAZolin (ANCEF) IV 1 g (07/31/19 0955)   .  atorvastatin  40 mg Oral QHS  . carvedilol  25 mg Oral BID WC  . docusate sodium  100 mg Oral BID  . famotidine  20 mg Oral QHS  . ferrous sulfate  325 mg Oral Daily  . furosemide  40 mg Intravenous Q12H  . gabapentin  100 mg Oral TID  . hydrALAZINE  25 mg Oral BID  . insulin aspart  0-5 Units Subcutaneous QHS  . insulin aspart  0-9 Units Subcutaneous TID WC  . isosorbide mononitrate  30 mg Oral Daily  . polyethylene glycol  17 g Oral Daily  . sodium bicarbonate  1,300 mg Oral BID   sodium chloride, acetaminophen **OR** acetaminophen, HYDROmorphone (DILAUDID) injection, nitroGLYCERIN, ondansetron **OR** ondansetron (ZOFRAN) IV, oxyCODONE  Assessment/ Plan:  58 y.o. male with  hypertension, diabetes mellitus type II, diabetic neuropathy, systolic congestive heart failure, peripheral vascular disease status post left toe amputations, autonomic neuropathy, coronary artery disease status post CABG, history of osteomyelitis, who has been admitted to [facility] on 07/27/2019 for acute exacerbation of congestive heart failure.   Active Problems:   Acute on chronic respiratory failure with hypoxemia (HCC)   #.AKI on  CKD st  4 Recent Labs    07/27/19 1711 07/28/19 0547 07/29/19 0520  CREATININE 3.55* 3.39* 3.45*  Baseline Cr 2.42 from April 2020/ GFR 29. more Cause of AKI is unclear at present Maybe combination of pre-renal and ATN in setting of possible infection - added Creatinine to blood in lab today -Diagnostic study today for the back lesion - Electrolytes and Volume status are acceptable No acute indication for Dialysis at present   #. Anemia of CKD  Lab Results  Component Value Date   HGB 7.7 (L) 07/30/2019  discussed risks and benefits of starting EPO with patient and his wife They agree Dose ordered weekly  #. SHPTH  No results found for: PTH Lab Results  Component Value Date   PHOS 5.5 (H) 07/27/2013  monitor level   #. Diabetes type 2 with CKD Hemoglobin A1C (%)  Date Value  07/19/2013 12.2 (H)   Hgb A1c MFr Bld (%)  Date Value  07/28/2019 7.2 (H)     LOS: [place] 9/14/202010:16 AM  [place], [place]

## 2019-07-31 NOTE — Progress Notes (Signed)
Physical Therapy Treatment Patient Details Name: [name] [name] MRN: [id number] DOB: February 26, 1961 Today's Date: 07/31/2019    History of Present Illness 58 yo male with onset of SOB and ventricular tachycardia was admitted, has been given diuretic for CHF and pulm edema.  Pt has been having hypoxia, drops in BP and has become anemic needing transfusion.  Also having back pain with possible infection, has demand ischemia.  PMHx:  lumbar DJD, L4 foraminal stenosis, cardiomegaly, L foot toe amputations, TEE, PVD, DM, CABG, CHF, CKD, HTN, MI, PN.    PT Comments    Pt reporting 4/10 low back pain beginning of session, minimal low back pain with ambulation, and 5/10 low back pain end of session resting in recliner.  Pt CGA with transfers and ambulation up to 100 feet with RW.  Limited distance ambulating d/t SOB and O2 sats decreased to 87% post ambulation (pt reporting h/o difficulty breathing through masks and initially unable to get oxygen to recover consistently greater than 90-91% but when pt brought mask down in order to breath better pt's O2 quickly increased to upper 90's).  Pt reporting mild "dizziness" beginning of session that did not change with mobility (pt reporting symptoms feeling medication related and not d/t his h/o orthostatic hypotension; BP 125/60 post ambulation sitting in recliner).  Overall pt steady ambulating with RW and appearing motivated to ambulate.  D/t pt's progress with functional mobility, PT follow-up discharge recommendations updated (see below for details).  Will continue to focus on strengthening, balance, endurance, and progressive independence with functional mobility.   Follow Up Recommendations  Home health PT;Supervision for mobility/OOB     Equipment Recommendations  Rolling walker with 5" wheels;3in1 (PT)    Recommendations for Other Services       Precautions / Restrictions Precautions Precautions: Fall Precaution Comments: monitor BP and O2  sats Restrictions Weight Bearing Restrictions: No    Mobility  Bed Mobility Overal bed mobility: Modified Independent Bed Mobility: Rolling;Sidelying to Sit Rolling: Modified independent (Device/Increase time) Sidelying to sit: Modified independent (Device/Increase time)       General bed mobility comments: pt able to perform with mild increased effort  Transfers Overall transfer level: Needs assistance Equipment used: Rolling walker (2 wheeled) Transfers: Sit to/from [facility] Sit to Stand: Min guard Stand pivot transfers: Min guard       General transfer comment: x1 trial from bed; x2 trials from recliner; mild increased effort to stand but steady  Ambulation/Gait Ambulation/Gait assistance: Min guard Gait Distance (Feet): (100 feet x2) Assistive device: Rolling walker (2 wheeled)   Gait velocity: mildly decreased   General Gait Details: partial step through gait pattern; steady with RW; limited distance d/t SOB   Stairs             Wheelchair Mobility    Modified Rankin (Stroke Patients Only)       Balance Overall balance assessment: Needs assistance;History of Falls Sitting-balance support: No upper extremity supported;Feet supported Sitting balance-Leahy Scale: Normal Sitting balance - Comments: steady sitting reaching outside BOS   Standing balance support: Single extremity supported Standing balance-Leahy Scale: Poor Standing balance comment: pt requiring at least single UE support for static standing balance                            Cognition Arousal/Alertness: Awake/alert Behavior During Therapy: WFL for tasks assessed/performed Overall Cognitive Status: Within Functional Limits for tasks assessed  Exercises      General Comments   Nursing cleared pt for participation in physical therapy.  Pt agreeable to PT session.      Pertinent Vitals/Pain Pain  Assessment: 0-10 Pain Score: 5  Pain Location: low back Pain Descriptors / Indicators: Sore;Tender Pain Intervention(s): Limited activity within patient's tolerance;Monitored during session;Premedicated before session;Repositioned;Other (comment)(RN notified)  HR WFL during session's activities.    Home Living                      Prior Function            PT Goals (current goals can now be found in the care plan section) Acute Rehab PT Goals Patient Stated Goal: get pain managed PT Goal Formulation: With patient Time For Goal Achievement: 08/12/19 Potential to Achieve Goals: Good Progress towards PT goals: Progressing toward goals    Frequency    Min 2X/week      PT Plan Discharge plan needs to be updated    Co-evaluation              AM-PAC PT "6 Clicks" Mobility   Outcome Measure  Help needed turning from your back to your side while in a flat bed without using bedrails?: None Help needed moving from lying on your back to sitting on the side of a flat bed without using bedrails?: None Help needed moving to and from a bed to a chair (including a wheelchair)?: A Little Help needed standing up from a chair using your arms (e.g., wheelchair or bedside chair)?: A Little Help needed to walk in hospital room?: A Little Help needed climbing 3-5 steps with a railing? : A Little 6 Click Score: 20    End of Session Equipment Utilized During Treatment: Gait belt Activity Tolerance: Patient tolerated treatment well Patient left: in chair;with call bell/phone within reach;with chair alarm set Nurse Communication: Mobility status;Precautions;Other (comment)(pt's O2 sats during session and pt's pain) PT Visit Diagnosis: Other abnormalities of gait and mobility (R26.89);Unsteadiness on feet (R26.81);Ataxic gait (R26.0);Difficulty in walking, not elsewhere classified (R26.2);Dizziness and giddiness (R42)     Time: 4680-3212 PT Time Calculation (min) (ACUTE ONLY):  40 min  Charges:  $Gait Training: 8-22 mins $Therapeutic Exercise: 8-22 mins $Therapeutic Activity: 8-22 mins                    Leitha Bleak, PT 07/31/19, 3:53 PM [phone]

## 2019-07-31 NOTE — Progress Notes (Signed)
Date of Admission:  07/27/2019     Subjective: Patient had aspiration done by IR of the lumbar soft tissue swelling.  Only a few cc of bloody fluid was aspirated. No fever Still has pain.  Medications:  . atorvastatin  40 mg Oral QHS  . carvedilol  25 mg Oral BID WC  . docusate sodium  100 mg Oral BID  . epoetin (EPOGEN/PROCRIT) injection  10,000 Units Subcutaneous Weekly  . famotidine  20 mg Oral QHS  . ferrous sulfate  325 mg Oral Daily  . gabapentin  100 mg Oral TID  . hydrALAZINE  25 mg Oral BID  . insulin aspart  0-5 Units Subcutaneous QHS  . insulin aspart  0-9 Units Subcutaneous TID WC  . isosorbide mononitrate  30 mg Oral Daily  . polyethylene glycol  17 g Oral Daily  . sodium bicarbonate  1,300 mg Oral BID    Objective: Vital signs in last 24 hours: Temp:  [98 F (36.7 C)-99.5 F (37.5 C)] 98 F (36.7 C) (09/14 1529) Pulse Rate:  [75-81] 75 (09/14 1529) Resp:  [15-19] 19 (09/14 0747) BP: (103-140)/(58-73) 125/60 (09/14 1529) SpO2:  [85 %-97 %] 97 % (09/14 1529) Weight:  [125.6 kg] 125.6 kg (09/14 0330)  PHYSICAL EXAM:  General: Sleepy because he just got pain medication. Back: Lumbar area tenderness present.  Aspiration site covered with a Band-Aid  lungs: Bilateral air entry Heart: S1-S2 Abdomen: Soft, non-tender,not distended. Bowel sounds normal. No masses Extremities: atraumatic, no cyanosis. No edema. No clubbing Skin: No rashes or lesions. Or bruising Lymph: Cervical, supraclavicular normal. Neurologic: Grossly non-focal  Lab Results Recent Labs    07/29/19 0520 07/30/19 0634 07/31/19 0524  WBC 12.4*  --   --   HGB 7.0* 7.7*  --   HCT 22.0*  --   --   NA 136  --  133*  K 4.5  --  4.9  4.8  CL 106  --  100  CO2 22  --  23  BUN 66*  --  74*  CREATININE 3.45*  --  4.43*   Liver Panel Recent Labs    07/31/19 0524  ALBUMIN 1.9*   Sedimentation Rate No results for input(s): ESRSEDRATE in the last 72 hours. C-Reactive Protein No  results for input(s): CRP in the last 72 hours.  Microbiology:  Studies/Results: [place] Fna Soft Tissue  Result Date: 07/31/2019 INDICATION: 58 year old male with spontaneous lumbar spine pain and evidence of cutaneous cellulitis with underlying edema/phlegmon/hematoma/abscess. Ultrasound evaluation demonstrates a very small amount of fluid. He presents for aspiration for culture. EXAM: US BIOPSY FNA W/ IMAGING MEDICATIONS: The patient is currently admitted to the hospital and receiving intravenous antibiotics. The antibiotics were administered within an appropriate time frame prior to the initiation of the procedure. ANESTHESIA/SEDATION: None COMPLICATIONS: None immediate. PROCEDURE: Informed written consent was obtained from the patient after a thorough discussion of the procedural risks, benefits and alternatives. All questions were addressed. A timeout was performed prior to the initiation of the procedure. Ultrasound was used to interrogate the region of clinical concern. There is a tiny crescent of fluid within the subcutaneous fat. Following sterile prep and drape in the standard fashion with chlorhexidine, local anesthesia was attained by infiltration with 1% lidocaine. A small dermatotomy was made. An 18 gauge trocar needle was then advanced into the fluid collection. Aspiration and agitation were performed yielding a small amount (1-2 mL) of serosanguineous fluid. No obvious purulence. IMPRESSION: Trace serosanguineous fluid successfully aspirated  from the region of clinical concern. The specimen was sent for culture. No frank purulence encountered. Electronically Signed   By: Jacqulynn Cadet M.D.   On: 07/31/2019 13:01     Assessment/Plan: 58 year old male with history of diabetes mellitus, coronary artery disease, status post CABG, congestive heart failure, cardiomyopathy peripheral vascular disease presented to the emergency department on 07/27/2019 with shortness of breath and mid lower back  pain for 2 weeks duration.  Soft tissue swelling over lumbar area.  Patient underwent aspiration by IR and only a few drops of bloody fluid was sent for culture.  Wonder whether this is infected hematoma.  The skin and soft tissue looks like cellulitis with underlying soft tissue infection.  Await culture.  Continue cefazolin for now.  AKI on CKD worsening.  Nephrology following Lasix could have increased the creatinine  Congestive heart failure  Diabetes mellitus  History of  MSSA bacteremia in October 2015 History of TMA left foot History of AIN due to beta-lactam.   Discussed the management with the hospitalist and the surgeon.

## 2019-07-31 NOTE — Care Management Important Message (Signed)
Important Message  Patient Details  Name: [name] [name] MRN: [id number] Date of Birth: [birth year]   Medicare Important Message Given:  Yes     Dannette Barbara 07/31/2019, 11:33 AM

## 2019-07-31 NOTE — Progress Notes (Signed)
[name] [name] at [place] NAME: [name] [name]    MR#:  [id number]  DATE OF BIRTH:  1961/07/14  SUBJECTIVE:   Denies any complaints other than low back pain. He is able to move all extremities well. No bowel or urinary incontinence. He is able to stand and pivot to the bathroom with help. REVIEW OF SYSTEMS:   Review of Systems  Constitutional: Negative for chills, fever and weight loss.  HENT: Negative for ear discharge, ear pain and nosebleeds.   Eyes: Negative for blurred vision, pain and discharge.  Respiratory: Positive for shortness of breath. Negative for sputum production, wheezing and stridor.   Cardiovascular: Positive for leg swelling. Negative for chest pain, palpitations, orthopnea and PND.  Gastrointestinal: Negative for abdominal pain, diarrhea, nausea and vomiting.  Genitourinary: Negative for frequency and urgency.  Musculoskeletal: Positive for back pain. Negative for joint pain.  Neurological: Positive for weakness. Negative for sensory change, speech change and focal weakness.  Psychiatric/Behavioral: Negative for depression and hallucinations. The patient is not nervous/anxious.    Tolerating Diet:some Tolerating PT: rehab  DRUG ALLERGIES:   Allergies  Allergen Reactions  . Other Anaphylaxis and Other (See Comments)    Pt. And wife state that he had a reaction to "some" antibiotic but they do not know what the name of it was.They are are very apprehensive about him receiving any antibiotic. Zosyn, Clindamycin, & Vancomycin (all started together unsure which caused reaction 07/18/2013) 07/30/19- Allergic interstitial nephritis to Unasyn/zosyn in 2014 needing renal biopsy     VITALS:  Blood pressure (!) 121/58, pulse 76, temperature 98.2 F (36.8 C), temperature source Oral, resp. rate 19, height 6\' 1"  (1.854 m), weight 125.6 kg, SpO2 94 %.  PHYSICAL EXAMINATION:   Physical Exam  GENERAL:  58 y.o.-year-old  patient lying in the bed with no acute distress. He is obese, chronically ill EYES: Pupils equal, round, reactive to light and accommodation. No scleral icterus. Extraocular muscles intact.  HEENT: Head atraumatic, normocephalic. Oropharynx and nasopharynx clear.  NECK:  Supple, no jugular venous distention. No thyroid enlargement, no tenderness.  LUNGS: decreased breath sounds bilaterally, no wheezing, rales, rhonchi. No use of accessory muscles of respiration.  CARDIOVASCULAR: S1, S2 normal. No murmurs, rubs, or gallops.  ABDOMEN: Soft, nontender, nondistended. Bowel sounds present. No organomegaly or mass.  EXTREMITIES: No cyanosis, clubbing  + edema b/l.    NEUROLOGIC: Cranial nerves II through XII are intact. No focal Motor or sensory deficits b/l. Some tenderness in the lower back. PSYCHIATRIC:  patient is alert and oriented x 3.  SKIN: mild redness in the lumbar area marked  LABORATORY PANEL:  CBC Recent Labs  Lab 07/29/19 0520 07/30/19 0634  WBC 12.4*  --   HGB 7.0* 7.7*  HCT 22.0*  --   PLT 259  --     Chemistries  Recent Labs  Lab 07/31/19 0524  NA 133*  K 4.9  4.8  CL 100  CO2 23  GLUCOSE 140*  BUN 74*  CREATININE 4.43*  CALCIUM 7.8*  MG 2.1   Cardiac Enzymes No results for input(s): TROPONINI in the last 168 hours. RADIOLOGY:  [place] Fna Soft Tissue  Result Date: 07/31/2019 INDICATION: 58 year old male with spontaneous lumbar spine pain and evidence of cutaneous cellulitis with underlying edema/phlegmon/hematoma/abscess. Ultrasound evaluation demonstrates a very small amount of fluid. He presents for aspiration for culture. EXAM: US BIOPSY FNA W/ IMAGING MEDICATIONS: The patient is currently admitted to the hospital and  receiving intravenous antibiotics. The antibiotics were administered within an appropriate time frame prior to the initiation of the procedure. ANESTHESIA/SEDATION: None COMPLICATIONS: None immediate. PROCEDURE: Informed written consent was  obtained from the patient after a thorough discussion of the procedural risks, benefits and alternatives. All questions were addressed. A timeout was performed prior to the initiation of the procedure. Ultrasound was used to interrogate the region of clinical concern. There is a tiny crescent of fluid within the subcutaneous fat. Following sterile prep and drape in the standard fashion with chlorhexidine, local anesthesia was attained by infiltration with 1% lidocaine. A small dermatotomy was made. An 18 gauge trocar needle was then advanced into the fluid collection. Aspiration and agitation were performed yielding a small amount (1-2 mL) of serosanguineous fluid. No obvious purulence. IMPRESSION: Trace serosanguineous fluid successfully aspirated from the region of clinical concern. The specimen was sent for culture. No frank purulence encountered. Electronically Signed   By: Jacqulynn Cadet M.D.   On: 07/31/2019 13:01   ASSESSMENT AND PLAN:  [name] Korn Murphyis a 58 y.o.malewith a hx of CAD s/p four-vessel CABG (04/2014), HFrEF secondary to ICM, NSVT, paroxysmal SVT, PVD, poorly controlled insulin-dependent diabetes with diabetic foot ulcers and diabetic peripheral neuropathyrequiring amputations of the left toes, osteomyelitis, CKD stage IV, hypertension, hyperlipidemia  1. Acute on chronic systolic dysfunction with history of ischemic cardiomyopathy -currently on IV Lasix 40 mg BID ---stop lasix due to rising creat --UOP 5.6 liters. Clinically looks better. sats 95% on RA -monitor input output, creatinine 3.45--4.43 -appreciate cardiology and nephrology input -low sodium diet -assess for home oxygen -patient has history of coronary artery disease status post CABG  2. Low back pain acute --MRI of the lumbar spine shows Large round 7 centimeter nonspecific soft tissue area  in the subcutaneous fat overlying the L4 vertebra to the right of midline.-This could be an infectious phlegmon or a  hematoma.--more favoring hemtoam per neurosx - ID consulted.-- Started on IV cefazolin -patient is not spiking any fever. -I spoke with neurosurgery on call Dr. Vertell Limber and he has reviewed the MRI with me on 9/12/202 Dr. Vertell Limber does not feel any surgical intervention at present. This likely is hematoma with his recent fall and drop in hemoglobin-Will continue IV and PO pain meds.\ --okay to start physical therapy per neurosurgery -status post ultrasound guided drain of bloody fluid by IR on September 14th  3. Acute on chronic kidney disease stage IV -appreciate Dr. Assunta Gambles input -monitor input output and creatinine -avoid nephrotoxic ends -continue PO bicarb -hold lasix  4. Acute on chronic anemia secondary to kidney disease, questionable hematoma in the back -will transfuse one unit today -came in with hemoglobin of 9.2--- 7.0-- one unit blood transfusion---7.7 -SCD -hold aspirin  5. Type II diabetes sliding scale insulin  6. Hyperlipidemia continue statins  7. Generalized weakness with multiple comorbidities-- physical therapy to see patient  Social worker for discharge planning I discussed at length with patient's wife that patient has multiple comorbidities and overall has a high risk of decline.Palliative care will be considered if patient continues to decline.  Spoke with Mrs. [name] on the phone and updated today.  Management plans discussed with the patient, family and they are in agreement.  CODE STATUS: full  DVT Prophylaxis: SCD  TOTAL TIME TAKING CARE OF THIS PATIENT: *30* minutes.  >50% time spent on counselling and coordination of care  POSSIBLE D/C IN *?* DAYS, DEPENDING ON CLINICAL CONDITION.  Note: This dictation was prepared with Viviann Spare  dictation along with smaller phrase technology. Any transcriptional errors that result from this process are unintentional.  Fritzi Mandes M.D on 07/31/2019 at 3:27 PM  Between 7am to 6pm - Pager - [phone]  After 6pm  go to [email or web address] - password [email or web address] [facility]  Office  [phone]  CC: Primary care physician; Tracie Harrier, MDPatient ID: [name] [name], male   DOB: 22-Nov-1960, 58 y.o.   MRN: [id number]

## 2019-08-01 ENCOUNTER — Telehealth: Payer: Self-pay

## 2019-08-01 DIAGNOSIS — I5043 Acute on chronic combined systolic (congestive) and diastolic (congestive) heart failure: Secondary | ICD-10-CM

## 2019-08-01 DIAGNOSIS — D631 Anemia in chronic kidney disease: Secondary | ICD-10-CM

## 2019-08-01 DIAGNOSIS — M545 Low back pain, unspecified: Secondary | ICD-10-CM

## 2019-08-01 DIAGNOSIS — Z79891 Long term (current) use of opiate analgesic: Secondary | ICD-10-CM

## 2019-08-01 DIAGNOSIS — J9621 Acute and chronic respiratory failure with hypoxia: Secondary | ICD-10-CM

## 2019-08-01 DIAGNOSIS — D649 Anemia, unspecified: Secondary | ICD-10-CM

## 2019-08-01 DIAGNOSIS — J9601 Acute respiratory failure with hypoxia: Secondary | ICD-10-CM

## 2019-08-01 LAB — RENAL FUNCTION PANEL
Albumin: 2.1 g/dL — ABNORMAL LOW (ref 3.5–5.0)
Anion gap: 10 (ref 5–15)
BUN: 82 mg/dL — ABNORMAL HIGH (ref 6–20)
CO2: 24 mmol/L (ref 22–32)
Calcium: 8.1 mg/dL — ABNORMAL LOW (ref 8.9–10.3)
Chloride: 100 mmol/L (ref 98–111)
Creatinine, Ser: 4.59 mg/dL — ABNORMAL HIGH (ref 0.61–1.24)
GFR calc Af Amer: 15 mL/min — ABNORMAL LOW (ref >60)
GFR calc non Af Amer: 13 mL/min — ABNORMAL LOW (ref >60)
Glucose, Bld: 197 mg/dL — ABNORMAL HIGH (ref 70–99)
Phosphorus: 6.6 mg/dL — ABNORMAL HIGH (ref 2.5–4.6)
Potassium: 4.1 mmol/L (ref 3.5–5.1)
Sodium: 134 mmol/L — ABNORMAL LOW (ref 135–145)

## 2019-08-01 LAB — GLUCOSE, CAPILLARY
Glucose-Capillary: 149 mg/dL — ABNORMAL HIGH (ref 70–99)
Glucose-Capillary: 207 mg/dL — ABNORMAL HIGH (ref 70–99)
Glucose-Capillary: 203 mg/dL — ABNORMAL HIGH (ref 70–99)
Glucose-Capillary: 140 mg/dL — ABNORMAL HIGH (ref 70–99)

## 2019-08-01 MED ORDER — MAGNESIUM HYDROXIDE 400 MG/5ML PO SUSP
30.0000 mL | Freq: Every evening | ORAL | Status: DC | PRN
  Administered 2019-08-01: 30 mL via ORAL
  Filled 2019-08-01: qty 30

## 2019-08-01 NOTE — Progress Notes (Signed)
Progress Note  Patient Name: [name] [name] Date of Encounter: 08/01/2019  Primary Cardiologist: Ida Rogue, MD   Subjective   Denies SOB and reports elevating head of bed due to back discomfort (denies orthopnea). Reports weight is significantly elevated from his home weight at almost 20lbs above baseline. Continues to report bilateral lower extremity edema. Stated he ambulated with PT earlier without any presyncope or syncope and no reported DOE. Denies any CP, palpitations, or racing HR.  Inpatient Medications    Scheduled Meds:  atorvastatin  40 mg Oral QHS   carvedilol  25 mg Oral BID WC   docusate sodium  100 mg Oral BID   epoetin (EPOGEN/PROCRIT) injection  10,000 Units Subcutaneous Weekly   famotidine  20 mg Oral QHS   ferrous sulfate  325 mg Oral Daily   gabapentin  100 mg Oral TID   hydrALAZINE  25 mg Oral BID   insulin aspart  0-5 Units Subcutaneous QHS   insulin aspart  0-9 Units Subcutaneous TID WC   isosorbide mononitrate  30 mg Oral Daily   polyethylene glycol  17 g Oral Daily   sodium bicarbonate  1,300 mg Oral BID   Continuous Infusions:  sodium chloride Stopped (08/01/19 0106)    ceFAZolin (ANCEF) IV Stopped (07/31/19 2332)   PRN Meds: sodium chloride, acetaminophen **OR** acetaminophen, HYDROmorphone (DILAUDID) injection, nitroGLYCERIN, ondansetron **OR** ondansetron (ZOFRAN) IV, oxyCODONE   Vital Signs    Vitals:   07/31/19 1529 07/31/19 1955 08/01/19 0417 08/01/19 0751  BP: 125/60 139/66 111/85 (!) 123/52  Pulse: 75 75 70 73  Resp:  16 18 19   Temp: 98 F (36.7 C) 98.3 F (36.8 C) 98.9 F (37.2 C) 98 F (36.7 C)  TempSrc: Oral Oral Oral Oral  SpO2: 97% 98% 92% 94%  Weight:   126.8 kg   Height:        Intake/Output Summary (Last 24 hours) at 08/01/2019 1009 Last data filed at 08/01/2019 1002 Gross per 24 hour  Intake 370.68 ml  Output 0 ml  Net 370.68 ml   Last 3 Weights 08/01/2019 07/31/2019 07/30/2019  Weight (lbs)  279 lb 8 oz 277 lb 275 lb 8 oz  Weight (kg) 126.78 kg 125.646 kg 124.966 kg      Telemetry    SR, PVCs, brief accelerated idioventricular rhythm - Personally Reviewed  ECG    No new tracings- Personally Reviewed  Physical Exam   GEN: No acute distress.  Lying on side due to back discomfort. Neck: JVD difficult to assess due to patient position Cardiac: RRR, no murmurs, rubs, or gallops.  Respiratory: Few bibasilar crackles. GI: Soft, nontender, non-distended  MS: Trace bilateral lower extremity edema; No deformity. Neuro:  Nonfocal  Psych: Normal affect   Labs    High Sensitivity Troponin:   Recent Labs  Lab 07/27/19 1711 07/27/19 1909 07/28/19 0303 07/28/19 0547  TROPONINIHS 32* 55* 35* 31*      Cardiac EnzymesNo results for input(s): TROPONINI in the last 168 hours. No results for input(s): TROPIPOC in the last 168 hours.   Chemistry Recent Labs  Lab 07/27/19 1711 07/28/19 0547 07/29/19 0520 07/31/19 0524  NA 134*  --  136 133*  K 4.7  --  4.5 4.9   4.8  CL 106  --  106 100  CO2 20*  --  22 23  GLUCOSE 344*  --  153* 140*  BUN 69*  --  66* 74*  CREATININE 3.55* 3.39* 3.45* 4.43*  CALCIUM  8.3*  --  7.8* 7.8*  ALBUMIN  --   --   --  1.9*  GFRNONAA 18* 19* 19* 14*  GFRAA 21* 22* 22* 16*  ANIONGAP 8  --  8 10     Hematology Recent Labs  Lab 07/27/19 1711 07/29/19 0520 07/30/19 0634  WBC 14.5* 12.4*  --   RBC 2.71* 2.40*  --   HGB 8.0* 7.0* 7.7*  HCT 25.0* 22.0*  --   MCV 92.3 91.7  --   MCH 29.5 29.2  --   MCHC 32.0 31.8  --   RDW 13.1 13.2  --   PLT 309 259  --     BNPNo results for input(s): BNP, PROBNP in the last 168 hours.   DDimer No results for input(s): DDIMER in the last 168 hours.   Radiology    [place] Fna Soft Tissue  Result Date: 07/31/2019 INDICATION: 58 year old male with spontaneous lumbar spine pain and evidence of cutaneous cellulitis with underlying edema/phlegmon/hematoma/abscess. Ultrasound evaluation demonstrates a very  small amount of fluid. He presents for aspiration for culture. EXAM: US BIOPSY FNA W/ IMAGING MEDICATIONS: The patient is currently admitted to the hospital and receiving intravenous antibiotics. The antibiotics were administered within an appropriate time frame prior to the initiation of the procedure. ANESTHESIA/SEDATION: None COMPLICATIONS: None immediate. PROCEDURE: Informed written consent was obtained from the patient after a thorough discussion of the procedural risks, benefits and alternatives. All questions were addressed. A timeout was performed prior to the initiation of the procedure. Ultrasound was used to interrogate the region of clinical concern. There is a tiny crescent of fluid within the subcutaneous fat. Following sterile prep and drape in the standard fashion with chlorhexidine, local anesthesia was attained by infiltration with 1% lidocaine. A small dermatotomy was made. An 18 gauge trocar needle was then advanced into the fluid collection. Aspiration and agitation were performed yielding a small amount (1-2 mL) of serosanguineous fluid. No obvious purulence. IMPRESSION: Trace serosanguineous fluid successfully aspirated from the region of clinical concern. The specimen was sent for culture. No frank purulence encountered. Electronically Signed   By: Jacqulynn Cadet M.D.   On: 07/31/2019 13:01    Cardiac Studies   2D Echo 06/29/2019: 1. The left ventricle has moderate-severely reduced systolic function, with an ejection fraction of 30-35%. The cavity size was normal. There is mildly increased left ventricular wall thickness. Left ventricular diastolic Doppler parameters are  consistent with pseudonormalization. Left ventricular diffuse hypokinesis. 2. The right ventricle has normal systolic function. The cavity was normal. There is no increase in right ventricular wall thickness. Unable to estimate RVSP. 3. Left atrial size was moderately dilated. __________  Elwyn Reach  10/2018: Normal sinus rhythm avg HR of 81 bpm.  8 Ventricular Tachycardia runs occurred, the run with the fastest interval lasting 4 beats with a max rate of 200 bpm,  the longest lasting 17 beats with an avg rate of 120 bpm.   7 Supraventricular Tachycardia runs occurred,  the run with the fastest interval lasting 5 beats with a max rate of 148 bpm,  the longest lasting 13 beats with an avg rate of 110 bpm.   Isolated SVEs were rare (<1.0%), SVE Couplets were rare (<1.0%), and SVE Triplets were rare (<1.0%).  Isolated VEs were rare (<1.0%), VE Couplets were rare (<1.0%), and no VE Triplets were present. Ventricular Trigeminy was present. __________  Myoview 09/2018: Pharmacological myocardial perfusion imaging study with large region of fixed inferior and inferolateral wall perfusion  defect consistent with previous MI Very mild ischemia in the inferolateral wall (peri-infarct ischemia) Hypokinesis noted in the inferior wall. EF estimated at 36% No EKG changes concerning for ischemia at peak stress or in recovery. Moderate risk scan Similar findings on the study noted in 02/2018 and several years ago.  Patient Profile     58 y.o. male with a hx of CAD s/p four-vessel CABG (04/2014), HFrEF secondary to ICM, NSVT, paroxysmal SVT, PVD, poorly controlled insulin-dependent diabetes with diabetic foot ulcers and diabetic peripheral neuropathy requiring amputations of the left toes, osteomyelitis, CKD stage IV, hypertension, hyperlipidemia, asymptomatic orthostatic hypotension, medication compliance issues secondary to financial issues, and who is being seen today for SOB at admission and recently diagnosed spinal hematoma.   Assessment & Plan    Chronic HFrEF 2/2 ICM  --Patient reporting volume overload despite no SOB with significant increase in weight. Last clinic wt 110.1kg  currently 126.8kg. Volume overload likely cardiorenal in etiology with worsening renal function and in the  setting of uncontrolled DM2.  --Consider also LEE as 2/2 severe anemia (Hgb 7.7 on 9/13) and hypoalbuminemia (albumin 1.9).  --Physical exam not consistent with the amount of volume overload that would correspond with above significantly increased weight; therefore, consider also weight increase in the setting of recent reduced activity level due to back pain. --Echo as above with EF 30-35%. Discussion with EP for ICD deferred at this time as previously noted given current spinal hematoma and multiple acute issues.  --Holding IV diuresis given bump in renal function with Cr 3.45  4.43 and BUN 66  74. Daily BMET to monitor renal function and electrolytes. Nephrology already following this admission given renal function.   Spinal hematoma, anemia --Continues to report back pain. --Hgb 7.7. Daily CBC. Per IM. --Per neurosurgery, IM.  CAD s/p 4v CABG --No current CP. Extensive cardiac history as above. Enzymes peaked at 55. EKG as above and without acute changes. Continue PTA medical management.  HTN with hypertensive heart disease --Continue PTA medical management.  HLD --Continue statin therapy.  CKDIV --Likely cardiorenal syndrome and worsening renal function in setting of poorly controlled DM2. Complicates diuresis. Daily BMET. Cr 3.55  3.39. Nephrology following.   DM2, poorly controlled --A1C now 6.9%, previously much higher at 12.2. --SSI, per IM.  PVD --L toes amputation.   H/o NSVT --Continue to monitor telemetry. --TSH 3.647. --Continue to monitor electrolytes.  Medication non-compliance --Compliance stressed.  For questions or updates, please contact [place] Please consult [email or web address] for contact info under        Signed, Arvil Chaco, PA-C  08/01/2019, 10:09 AM

## 2019-08-01 NOTE — TOC Progression Note (Addendum)
Transition of Care [facility]) - Progression Note    Patient Details  Name: MCCADE SULLENBERGER MRN: [id number] Date of Birth: 1961/10/29  Transition of Care [facility]) CM/SW Contact  Elza Rafter, RN Phone Number: 08/01/2019, 1:57 PM  Clinical Narrative:   Referral made to [facility] for RN, PT and aide.    Geoffry Paradise, BSN, RN Care Manager [phone]       Barriers to Discharge: Continued Medical Work up  Expected Discharge Plan and Services         Living arrangements for the past 2 months: Single Family Home                                       Social Determinants of Health (SDOH) Interventions    Readmission Risk Interventions Readmission Risk Prevention Plan 08/01/2019  Transportation Screening Complete  PCP or Specialist Appt within 3-5 Days Complete  HRI or [place] Complete  Social Work Consult for [place] Planning/Counseling Complete  Palliative Care Screening Not Applicable  Medication Review [employer]) Complete  Some recent data might be hidden

## 2019-08-01 NOTE — Progress Notes (Signed)
Inpatient Rehabilitation-Admissions Coordinator   Noted pt has progressed very well with PT. Pt is currently Min G for tranfers and Min G 100 ft x2. Pt no longer requires IP Rehab. Anticipate pt will continue to progress well with therapies while in house. Will not pursue CIR at this time. CM aware. AC will sign off.   Jhonnie Garner, OTR/L  Rehab Admissions Coordinator  [phone] 08/01/2019 10:59 AM

## 2019-08-01 NOTE — Telephone Encounter (Signed)
Patient wife states that he is currently in the hospital. Patient states he has already had one blood transfusion in the hospital but they can not do another one because his kidney function is to bad. Talk to Dr. Bonna Gains and she still wants to do a referral. Put in referral to Dr, Janese Banks

## 2019-08-01 NOTE — Progress Notes (Signed)
Date of Admission:  07/27/2019      Subjective: Patient in bed Says he is feeling sleepy He is on pain medication which is oxycodone Says he has some back pain  Medications:  . atorvastatin  40 mg Oral QHS  . carvedilol  25 mg Oral BID WC  . docusate sodium  100 mg Oral BID  . epoetin (EPOGEN/PROCRIT) injection  10,000 Units Subcutaneous Weekly  . famotidine  20 mg Oral QHS  . ferrous sulfate  325 mg Oral Daily  . gabapentin  100 mg Oral TID  . hydrALAZINE  25 mg Oral BID  . insulin aspart  0-5 Units Subcutaneous QHS  . insulin aspart  0-9 Units Subcutaneous TID WC  . isosorbide mononitrate  30 mg Oral Daily  . polyethylene glycol  17 g Oral Daily  . sodium bicarbonate  1,300 mg Oral BID    Objective: Vital signs in last 24 hours: Temp:  [98 F (36.7 C)-98.9 F (37.2 C)] 98.4 F (36.9 C) (09/15 1536) Pulse Rate:  [70-75] 74 (09/15 1536) Resp:  [16-19] 17 (09/15 1536) BP: (111-139)/(52-85) 122/62 (09/15 1536) SpO2:  [92 %-98 %] 95 % (09/15 1536) Weight:  [126.8 kg] 126.8 kg (09/15 0417)  PHYSICAL EXAM:  General: Appears chronically ill, pale but stable Head: Normocephalic, without obvious abnormality, atraumatic. Eyes: Conjunctivae clear, anicteric sclerae. Pupils are equal ENT Nares normal. No drainage or sinus tenderness. Lips, mucosa, and tongue normal. No Thrush Neck: Supple, . Back: Area of induration and erythema in the lumbar area.  Less tender to touch than before. Lungs: Bilateral air entry Crepitations in the bases. Heart: S1-S2 Abdomen: Soft, non-tender,not distended. Bowel sounds normal.  Edema of the abdominal wall on the sides on the back Extremities: Edema legs skin: No rashes or lesions. Or bruising Lymph: Cervical, supraclavicular normal. Neurologic: Grossly non-focal  Lab Results Recent Labs    07/30/19 0634 07/31/19 0524 08/01/19 1026  HGB 7.7*  --   --   NA  --  133* 134*  K  --  4.9  4.8 4.1  CL  --  100 100  CO2  --  23 24  BUN   --  74* 82*  CREATININE  --  4.43* 4.59*   Liver Panel Recent Labs    07/31/19 0524 08/01/19 1026  ALBUMIN 1.9* 2.1*   Sedimentation Rate No results for input(s): ESRSEDRATE in the last 72 hours. C-Reactive Protein No results for input(s): CRP in the last 72 hours.  Microbiology:  Studies/Results: [place] Fna Soft Tissue  Result Date: 07/31/2019 INDICATION: 58 year old male with spontaneous lumbar spine pain and evidence of cutaneous cellulitis with underlying edema/phlegmon/hematoma/abscess. Ultrasound evaluation demonstrates a very small amount of fluid. He presents for aspiration for culture. EXAM: US BIOPSY FNA W/ IMAGING MEDICATIONS: The patient is currently admitted to the hospital and receiving intravenous antibiotics. The antibiotics were administered within an appropriate time frame prior to the initiation of the procedure. ANESTHESIA/SEDATION: None COMPLICATIONS: None immediate. PROCEDURE: Informed written consent was obtained from the patient after a thorough discussion of the procedural risks, benefits and alternatives. All questions were addressed. A timeout was performed prior to the initiation of the procedure. Ultrasound was used to interrogate the region of clinical concern. There is a tiny crescent of fluid within the subcutaneous fat. Following sterile prep and drape in the standard fashion with chlorhexidine, local anesthesia was attained by infiltration with 1% lidocaine. A small dermatotomy was made. An 18 gauge trocar needle was then advanced  into the fluid collection. Aspiration and agitation were performed yielding a small amount (1-2 mL) of serosanguineous fluid. No obvious purulence. IMPRESSION: Trace serosanguineous fluid successfully aspirated from the region of clinical concern. The specimen was sent for culture. No frank purulence encountered. Electronically Signed   By: Jacqulynn Cadet M.D.   On: 07/31/2019 13:01     Assessment/Plan: 58 year old male with history  of diabetes mellitus, coronary artery disease status post CABG, congestive heart failure, cardiomyopathy, peripheral vascular disease presented to the emergency department on 07/27/2019 with shortness of breath and mid lower back pain for 2 weeks duration.  Soft tissue swelling over the lumbar area. There was a concern for infection versus hematoma.  He underwent aspiration by IR yesterday and a few drops of bloody fluid was sent for culture.  No WBCs or bacteria seen on the Gram stain.  Culture so far is negative.  Very likely this would turn out to be a hematoma.  Antibiotics can be stopped and he can be observed off of it.  There is also dependent edema because patient is lying on his back the whole time.  He need to participate in his PT and ambulate so as to reduce the edema from his back.  AKI on CKD.  Worsening.  Nephrologist on board.  Diuretics on hold  Acute hypoxic respiratory failure with hypoxemia congestive heart failure  Anemia combination of CKD, rule out hematoma  Discussed the management with the patient and the hospitalist. Will sign off once the culture finalizes.

## 2019-08-01 NOTE — TOC Progression Note (Signed)
Transition of Care [facility]) - Progression Note    Patient Details  Name: [name] [name] MRN: [id number] Date of Birth: [birth year]  Transition of Care [facility] CM/SW Contact  Elza Rafter, RN Phone Number: 08/01/2019, 12:02 PM  Clinical Narrative:   Damaris Schooner with Claiborne Billings from [place].  Patient did really well with PT yesterday and will not meet criteria for CIR.  Will speak to patient and spouse about home with home health.        Barriers to Discharge: Continued Medical Work up  Expected Discharge Plan and Services         Living arrangements for the past 2 months: Single Family Home                                       Social Determinants of Health (SDOH) Interventions    Readmission Risk Interventions No flowsheet data found.

## 2019-08-01 NOTE — Progress Notes (Signed)
[facility] [place] NAME: [name] [name]    MR#:  [id number]  DATE OF BIRTH:  10/21/1961  SUBJECTIVE:   Denies any complaints other than low back pain. He is able to move all extremities well. No bowel or urinary incontinence. he did very well with physical therapy REVIEW OF SYSTEMS:   Review of Systems  Constitutional: Negative for chills, fever and weight loss.  HENT: Negative for ear discharge, ear pain and nosebleeds.   Eyes: Negative for blurred vision, pain and discharge.  Respiratory: Positive for shortness of breath. Negative for sputum production, wheezing and stridor.   Cardiovascular: Positive for leg swelling. Negative for chest pain, palpitations, orthopnea and PND.  Gastrointestinal: Negative for abdominal pain, diarrhea, nausea and vomiting.  Genitourinary: Negative for frequency and urgency.  Musculoskeletal: Positive for back pain. Negative for joint pain.  Neurological: Positive for weakness. Negative for sensory change, speech change and focal weakness.  Psychiatric/Behavioral: Negative for depression and hallucinations. The patient is not nervous/anxious.    Tolerating Diet:yes Tolerating PT: health  DRUG ALLERGIES:   Allergies  Allergen Reactions  . Other Anaphylaxis and Other (See Comments)    Pt. And wife state that he had a reaction to "some" antibiotic but they do not know what the name of it was.They are are very apprehensive about him receiving any antibiotic. Zosyn, Clindamycin, & Vancomycin (all started together unsure which caused reaction 07/18/2013) 07/30/19- Allergic interstitial nephritis to Unasyn/zosyn in 2014 needing renal biopsy     VITALS:  Blood pressure 122/62, pulse 74, temperature 98.4 F (36.9 C), temperature source Oral, resp. rate 17, height 6\' 1"  (1.854 m), weight 126.8 kg, SpO2 95 %.  PHYSICAL EXAMINATION:   Physical Exam  GENERAL:  58-year-old patient lying in the bed  with no acute distress. He is obese, chronically ill EYES: Pupils equal, round, reactive to light and accommodation. No scleral icterus. Extraocular muscles intact.  HEENT: Head atraumatic, normocephalic. Oropharynx and nasopharynx clear.  NECK:  Supple, no jugular venous distention. No thyroid enlargement, no tenderness.  LUNGS: decreased breath sounds bilaterally, no wheezing, rales, rhonchi. No use of accessory muscles of respiration.  CARDIOVASCULAR: S1, S2 normal. No murmurs, rubs, or gallops.  ABDOMEN: Soft, nontender, nondistended. Bowel sounds present. No organomegaly or mass.  EXTREMITIES: No cyanosis, clubbing  + edema b/l.    NEUROLOGIC: Cranial nerves II through XII are intact. No focal Motor or sensory deficits b/l. Some tenderness in the lower back. PSYCHIATRIC:  patient is alert and oriented x 3.  SKIN: mild redness in the lumbar area marked  LABORATORY PANEL:  CBC Recent Labs  Lab 07/29/19 0520 07/30/19 0634  WBC 12.4*  --   HGB 7.0* 7.7*  HCT 22.0*  --   PLT 259  --     Chemistries  Recent Labs  Lab 07/31/19 0524 08/01/19 1026  NA 133* 134*  K 4.9  4.8 4.1  CL 100 100  CO2 23 24  GLUCOSE 140* 197*  BUN 74* 82*  CREATININE 4.43* 4.59*  CALCIUM 7.8* 8.1*  MG 2.1  --    Cardiac Enzymes No results for input(s): TROPONINI in the last 168 hours. RADIOLOGY:  [place] Fna Soft Tissue  Result Date: 07/31/2019 INDICATION: 58 year old male with spontaneous lumbar spine pain and evidence of cutaneous cellulitis with underlying edema/phlegmon/hematoma/abscess. Ultrasound evaluation demonstrates a very small amount of fluid. He presents for aspiration for culture. EXAM: US BIOPSY FNA W/ IMAGING MEDICATIONS: The patient  is currently admitted to the hospital and receiving intravenous antibiotics. The antibiotics were administered within an appropriate time frame prior to the initiation of the procedure. ANESTHESIA/SEDATION: None COMPLICATIONS: None immediate. PROCEDURE:  Informed written consent was obtained from the patient after a thorough discussion of the procedural risks, benefits and alternatives. All questions were addressed. A timeout was performed prior to the initiation of the procedure. Ultrasound was used to interrogate the region of clinical concern. There is a tiny crescent of fluid within the subcutaneous fat. Following sterile prep and drape in the standard fashion with chlorhexidine, local anesthesia was attained by infiltration with 1% lidocaine. A small dermatotomy was made. An 18 gauge trocar needle was then advanced into the fluid collection. Aspiration and agitation were performed yielding a small amount (1-2 mL) of serosanguineous fluid. No obvious purulence. IMPRESSION: Trace serosanguineous fluid successfully aspirated from the region of clinical concern. The specimen was sent for culture. No frank purulence encountered. Electronically Signed   By: Jacqulynn Cadet M.D.   On: 07/31/2019 13:01   ASSESSMENT AND PLAN:  [name] [name] a 58 y.o.malewith a hx of CAD s/p four-vessel CABG (04/2014), HFrEF secondary to ICM, NSVT, paroxysmal SVT, PVD, poorly controlled insulin-dependent diabetes with diabetic foot ulcers and diabetic peripheral neuropathyrequiring amputations of the left toes, osteomyelitis, CKD stage IV, hypertension, hyperlipidemia  1. Acute on chronic systolic dysfunction with history of ischemic cardiomyopathy -was on IV Lasix 40 mg BID--discontinued ---stop lasix due to rising creat --UOP 5.6 liters. Clinically looks better. sats 95% on RA -monitor input output, creatinine 3.45--4.43--4.61\ -appreciate cardiology and nephrology input -low sodium diet -assess for home oxygen -patient has history of coronary artery disease status post CABG  2. Low back pain acute --MRI of the lumbar spine shows Large round 7 centimeter nonspecific soft tissue area  in the subcutaneous fat overlying the L4 vertebra to the right of  midline.-This could be an infectious phlegmon or a hematoma.--more favoring hemtoam per neurosx - ID consulted.-- Started on IV cefazolin-- discontinued -fluid culture negative -patient is not spiking any fever. --okay to start physical therapy per neurosurgery -status post ultrasound guided drain of bloody fluid by IR on September 14th-- fluid culture negative  3. Acute on chronic kidney disease stage IV -appreciate Dr. Assunta Gambles input -monitor input output and creatinine -avoid nephrotoxic ends -continue PO bicarb -hold lasix -creat rising 4.6  4. Acute on chronic anemia secondary to kidney disease, questionable hematoma in the back -will transfuse one unit today -came in with hemoglobin of 9.2--- 7.0-- one unit blood transfusion---7.7 -SCD -hold aspirin  5. Type II diabetes sliding scale insulin  6. Hyperlipidemia continue statins  7. Generalized weakness with multiple comorbidities-- physical therapy to see patient   Spoke with Mrs. Murphyand updated today.  Management plans discussed with the patient, family and they are in agreement.  CODE STATUS: full  DVT Prophylaxis: SCD  TOTAL TIME TAKING CARE OF THIS PATIENT: *30* minutes.  >50% time spent on counselling and coordination of care  POSSIBLE D/C IN *?* DAYS, DEPENDING ON CLINICAL CONDITION.  Note: This dictation was prepared with Dragon dictation along with smaller phrase technology. Any transcriptional errors that result from this process are unintentional.  Fritzi Mandes M.D on 08/01/2019 at 3:36 PM  Between 7am to 6pm - Pager - [phone]  After 6pm go to [email or web address] - password [email or web address] [facility]  Office  [phone]  CC: Primary care physician; Tracie Harrier, MDPatient ID: [name] [name], male  DOB: Apr 05, 1961, 58 y.o.   MRN: [id number]

## 2019-08-01 NOTE — Telephone Encounter (Signed)
-----   Message from Virgel Manifold, MD sent at 08/01/2019 12:52 PM EDT ----- Caryl Pina please let the patient know, his work-up showed that his breath test was negative for infection.  He still has anemia and should see hematology for further anemia work-up to evaluate the etiology of his anemia.  Please refer to Dr. Janese Banks.

## 2019-08-01 NOTE — Progress Notes (Addendum)
[facility] SURGICAL PROGRESS NOTE (cpt [phone])  Hospital Day(s): 4.   Interval History: Patient seen and examined, no acute events or new complaints overnight. Patient still with lower back discomfort which he describes as "about the same" compared to yesterday. No fever, chills. He did have aspiration of tiny amount of bloody fluid from this area with IR yesterday. No other acute surgical complaints.   Review of Systems:  Constitutional: denies fever, chills  HEENT: denies cough or congestion  Respiratory: denies any shortness of breath  Cardiovascular: denies chest pain or palpitations  Integumentary: + cellulitis (lower back)   Vital signs in last 24 hours: [min-max] current  Temp:  [98 F (36.7 C)-98.9 F (37.2 C)] 98 F (36.7 C) (09/15 0751) Pulse Rate:  [70-81] 73 (09/15 0751) Resp:  [16-19] 19 (09/15 0751) BP: (108-139)/(52-85) 123/52 (09/15 0751) SpO2:  [92 %-98 %] 94 % (09/15 0751) Weight:  [126.8 kg] 126.8 kg (09/15 0417)     Height: 6\' 1"  (185.4 cm) Weight: 126.8 kg BMI (Calculated): 36.88   Intake/Output last 2 shifts:  09/14 0701 - 09/15 0700 In: 130.7 [I.V.:30.7; IV Piggyback:100] Out: 0    Physical Exam:  Constitutional: alert, cooperative and no distress  HENT: normocephalic without obvious abnormality  Eyes: PERRL, EOM's grossly intact and symmetric  Respiratory: breathing non-labored at rest  Integumentary: There is an area or erythema and tenderness to the midline lumbar back, induration palpable, no fluctuance or crepitus, no drainage.     Labs:  CBC Latest Ref Rng & Units 07/30/2019 07/29/2019 07/27/2019  WBC 4.0 - 10.5 K/uL - 12.4(H) 14.5(H)  Hemoglobin 13.0 - 17.0 g/dL 7.7(L) 7.0(L) 8.0(L)  Hematocrit 39.0 - 52.0 % - 22.0(L) 25.0(L)  Platelets 150 - 400 K/uL - 259 309   CMP Latest Ref Rng & Units 07/31/2019 07/31/2019 07/29/2019  Glucose 70 - 99 mg/dL 140(H) - 153(H)  BUN 6 - 20 mg/dL 74(H) - 66(H)  Creatinine 0.61 - 1.24 mg/dL  4.43(H) - 3.45(H)  Sodium 135 - 145 mmol/L 133(L) - 136  Potassium 3.5 - 5.1 mmol/L 4.9 4.8 4.5  Chloride 98 - 111 mmol/L 100 - 106  CO2 22 - 32 mmol/L 23 - 22  Calcium 8.9 - 10.3 mg/dL 7.8(L) - 7.8(L)  Total Protein 6.5 - 8.1 g/dL - - -  Total Bilirubin 0.3 - 1.2 mg/dL - - -  Alkaline Phos 38 - 126 U/L - - -  AST 15 - 41 U/L - - -  ALT 0 - 44 U/L - - -    Imaging studies: No new pertinent imaging studies   Assessment/Plan: (ICD-10's: L39.312) 58 y.o. male with what clinically appears to be stable cellulitis to lower back WITHOUT appreciable abscess on examination and no evidence of necrotizing infection, complicated by multiple comorbid conditions   - No indication for I&D or surgical intervention at this time; we will follow closely to ensure he does not develop worsening infection             - If he worsens or fails to improve with ABx alone, we can consider repeat imaging in a day or two             - Continue IV ABx   - Follow up aspiration cultures; no growth  - Follow up ID recommendations    - further management per primary service   All of the above findings and recommendations were discussed with the patient, and the medical team, and all of  patient's questions were answered to his expressed satisfaction.  -- Edison Simon, PA-C [facility] 08/01/2019, 8:30 AM [phone] M-F: 7am - 4pm   I saw and evaluated the patient.  I agree with the above documentation, exam, and plan, which I have edited where appropriate. Fredirick Maudlin  3:50 PM

## 2019-08-01 NOTE — Progress Notes (Signed)
[facility], [place] 08/01/19  Subjective:   LOS: 4 09/14 0701 - 09/15 0700 In: 130.7 [I.V.:30.7; IV Piggyback:100] Out: 0  Feels about the same Appetite still poor Still has some lower extremity edema creatinine today slightly worse Patient's wife is at bedside   Objective:  Vital signs in last 24 hours:  Temp:  [98 F (36.7 C)-98.9 F (37.2 C)] 98 F (36.7 C) (09/15 0751) Pulse Rate:  [70-76] 73 (09/15 0751) Resp:  [16-19] 19 (09/15 0751) BP: (111-139)/(52-85) 123/52 (09/15 0751) SpO2:  [92 %-98 %] 94 % (09/15 0751) Weight:  [126.8 kg] 126.8 kg (09/15 0417)  Weight change: 1.134 kg Filed Weights   07/30/19 0342 07/31/19 0330 08/01/19 0417  Weight: 125 kg 125.6 kg 126.8 kg    Intake/Output:    Intake/Output Summary (Last 24 hours) at 08/01/2019 1212 Last data filed at 08/01/2019 1002 Gross per 24 hour  Intake 314.73 ml  Output 0 ml  Net 314.73 ml   Physical Exam: Gen:   Alert, cooperative, no distress, appears stated age Head:   Normocephalic, without obvious abnormality, atraumatic Eyes/ENT:  conjunctiva/corneas clear,  moist oral mucus membranes Neck:  Supple,   Lungs:   Clear to auscultation bilaterally, respirations unlabored Heart:   No rub  Abdomen:   Soft, non-tender  Extremities: no cyanosis , 1+ b/l edema Skin:  Skin color, texture, turgor normal, no rashes or lesions Neurologic: Alert and oriented, able to answer questions appropriately    Basic Metabolic Panel:  Recent Labs  Lab 07/27/19 1711 07/28/19 0303 07/28/19 0547 07/29/19 0520 07/31/19 0524 08/01/19 1026  NA 134*  --   --  136 133* 134*  K 4.7  --   --  4.5 4.9  4.8 4.1  CL 106  --   --  106 100 100  CO2 20*  --   --  22 23 24   GLUCOSE 344*  --   --  153* 140* 197*  BUN 69*  --   --  66* 74* 82*  CREATININE 3.55*  --  3.39* 3.45* 4.43* 4.59*  CALCIUM 8.3*  --   --  7.8* 7.8* 8.1*  MG  --  2.3  --   --  2.1  --   PHOS  --   --   --   --  6.2*  6.6*     CBC: Recent Labs  Lab 07/27/19 1711 07/29/19 0520 07/30/19 0634  WBC 14.5* 12.4*  --   HGB 8.0* 7.0* 7.7*  HCT 25.0* 22.0*  --   MCV 92.3 91.7  --   PLT 309 259  --      No results found for: HEPBSAG, HEPBSAB, HEPBIGM    Microbiology:  Recent Results (from the past 240 hour(s))  SARS Coronavirus 2 [facility] order, Performed in [facility] hospital lab) Nasopharyngeal Nasopharyngeal Swab     Status: None   Collection Time: 07/27/19 10:36 PM   Specimen: Nasopharyngeal Swab  Result Value Ref Range Status   SARS Coronavirus 2 NEGATIVE NEGATIVE Final    Comment: (NOTE) If result is NEGATIVE SARS-CoV-2 target nucleic acids are NOT DETECTED. The SARS-CoV-2 RNA is generally detectable in upper and lower  respiratory specimens during the acute phase of infection. The lowest  concentration of SARS-CoV-2 viral copies this assay can detect is 250  copies / mL. A negative result does not preclude SARS-CoV-2 infection  and should not be used as the sole basis for treatment or other  patient  management decisions.  A negative result may occur with  improper specimen collection / handling, submission of specimen other  than nasopharyngeal swab, presence of viral mutation(s) within the  areas targeted by this assay, and inadequate number of viral copies  (<250 copies / mL). A negative result must be combined with clinical  observations, patient history, and epidemiological information. If result is POSITIVE SARS-CoV-2 target nucleic acids are DETECTED. The SARS-CoV-2 RNA is generally detectable in upper and lower  respiratory specimens dur ing the acute phase of infection.  Positive  results are indicative of active infection with SARS-CoV-2.  Clinical  correlation with patient history and other diagnostic information is  necessary to determine patient infection status.  Positive results do  not rule out bacterial infection or co-infection with other viruses. If result is  PRESUMPTIVE POSTIVE SARS-CoV-2 nucleic acids MAY BE PRESENT.   A presumptive positive result was obtained on the submitted specimen  and confirmed on repeat testing.  While 2019 novel coronavirus  (SARS-CoV-2) nucleic acids may be present in the submitted sample  additional confirmatory testing may be necessary for epidemiological  and / or clinical management purposes  to differentiate between  SARS-CoV-2 and other Sarbecovirus currently known to infect humans.  If clinically indicated additional testing with an alternate test  methodology [phone]) is advised. The SARS-CoV-2 RNA is generally  detectable in upper and lower respiratory sp ecimens during the acute  phase of infection. The expected result is Negative. Fact Sheet for Patients:  StrictlyIdeas.no Fact Sheet for Healthcare Providers: BankingDealers.co.za This test is not yet approved or cleared by the [place] FDA and has been authorized for detection and/or diagnosis of SARS-CoV-2 by FDA under an Emergency Use Authorization (EUA).  This EUA will remain in effect (meaning this test can be used) for the duration of the COVID-19 declaration under Section 564(b)(1) of the Act, 21 U.S.C. section 360bbb-3(b)(1), unless the authorization is terminated or revoked sooner. Performed at [facility], [address] [place], [place] [address]   CULTURE, BLOOD (ROUTINE X 2) w Reflex to ID Panel     Status: None (Preliminary result)   Collection Time: 07/30/19  6:55 PM   Specimen: BLOOD  Result Value Ref Range Status   Specimen Description BLOOD RIGHT ANTECUBITAL  Final   Special Requests   Final    BOTTLES DRAWN AEROBIC AND ANAEROBIC Blood Culture adequate volume   Culture   Final    NO GROWTH 2 DAYS Performed at [facility], [address] [place], [place] [address]    Report Status PENDING  Incomplete  CULTURE, BLOOD (ROUTINE X 2) w Reflex to ID Panel      Status: None (Preliminary result)   Collection Time: 07/30/19  6:55 PM   Specimen: BLOOD  Result Value Ref Range Status   Specimen Description BLOOD RIGHT HAND  Final   Special Requests   Final    BOTTLES DRAWN AEROBIC AND ANAEROBIC Blood Culture adequate volume   Culture   Final    NO GROWTH 2 DAYS Performed at [facility], [address], [place],  [address]    Report Status PENDING  Incomplete  Aerobic/Anaerobic Culture (surgical/deep wound)     Status: None (Preliminary result)   Collection Time: 07/31/19 11:35 AM   Specimen: Wound; Tissue  Result Value Ref Range Status   Specimen Description   Final    WOUND RIGHT LATERAL L4 SPINE Performed at [facility] [address],  [place], [place] [address]    Special Requests   Final    Normal Performed at [facility], [place], [place] [address]    Gram Stain NO WBC SEEN NO ORGANISMS SEEN   Final   Culture   Final    NO GROWTH < 24 HOURS Performed at [facility], [place] [address] [place], [place] [address]    Report Status PENDING  Incomplete    Coagulation Studies: No results for input(s): LABPROT, INR in the last 72 hours.  Urinalysis: No results for input(s): COLORURINE, LABSPEC, PHURINE, GLUCOSEU, HGBUR, BILIRUBINUR, KETONESUR, PROTEINUR, UROBILINOGEN, NITRITE, LEUKOCYTESUR in the last 72 hours.  Invalid input(s): APPERANCEUR    Imaging: [place] Fna Soft Tissue  Result Date: 07/31/2019 INDICATION: 59 year old male with spontaneous lumbar spine pain and evidence of cutaneous cellulitis with underlying edema/phlegmon/hematoma/abscess. Ultrasound evaluation demonstrates a very small amount of fluid. He presents for aspiration for culture. EXAM: US BIOPSY FNA W/ IMAGING MEDICATIONS: The patient is currently admitted to the hospital and receiving intravenous antibiotics. The antibiotics were administered within an appropriate time frame prior to the initiation of the  procedure. ANESTHESIA/SEDATION: None COMPLICATIONS: None immediate. PROCEDURE: Informed written consent was obtained from the patient after a thorough discussion of the procedural risks, benefits and alternatives. All questions were addressed. A timeout was performed prior to the initiation of the procedure. Ultrasound was used to interrogate the region of clinical concern. There is a tiny crescent of fluid within the subcutaneous fat. Following sterile prep and drape in the standard fashion with chlorhexidine, local anesthesia was attained by infiltration with 1% lidocaine. A small dermatotomy was made. An 18 gauge trocar needle was then advanced into the fluid collection. Aspiration and agitation were performed yielding a small amount (1-2 mL) of serosanguineous fluid. No obvious purulence. IMPRESSION: Trace serosanguineous fluid successfully aspirated from the region of clinical concern. The specimen was sent for culture. No frank purulence encountered. Electronically Signed   By: Jacqulynn Cadet M.D.   On: 07/31/2019 13:01     Medications:   . sodium chloride Stopped (08/01/19 0106)  .  ceFAZolin (ANCEF) IV Stopped (07/31/19 2332)   . atorvastatin  40 mg Oral QHS  . carvedilol  25 mg Oral BID WC  . docusate sodium  100 mg Oral BID  . epoetin (EPOGEN/PROCRIT) injection  10,000 Units Subcutaneous Weekly  . famotidine  20 mg Oral QHS  . ferrous sulfate  325 mg Oral Daily  . gabapentin  100 mg Oral TID  . hydrALAZINE  25 mg Oral BID  . insulin aspart  0-5 Units Subcutaneous QHS  . insulin aspart  0-9 Units Subcutaneous TID WC  . isosorbide mononitrate  30 mg Oral Daily  . polyethylene glycol  17 g Oral Daily  . sodium bicarbonate  1,300 mg Oral BID   sodium chloride, acetaminophen **OR** acetaminophen, HYDROmorphone (DILAUDID) injection, nitroGLYCERIN, ondansetron **OR** ondansetron (ZOFRAN) IV, oxyCODONE  Assessment/ Plan:  57 y.o. male with  hypertension, diabetes mellitus type II,  diabetic neuropathy, systolic congestive heart failure, peripheral vascular disease status post left toe amputations, autonomic neuropathy, coronary artery disease status post CABG, history of osteomyelitis, who has been admitted [facility] for acute exacerbation of congestive heart failure.   Active Problems:   Acute on chronic respiratory failure with hypoxemia (HCC)   #.AKI on  CKD st 4 Recent Labs    07/28/19 0547 07/29/19 0520 07/31/19 0524 08/01/19 1026  CREATININE 3.39* 3.45* 4.43* 4.59*  Baseline Cr 2.42 from April 2020/ GFR 29. Cause of AKI is unclear at present, but likely cardiorenal & ATN from excessive diuresis  - antibiotics have been discontinued -Rate of rise of creatinine has slowed down -Diuresis is on hold - Electrolytes and Volume status are acceptable - No acute indication for Dialysis at present   #. Anemia of CKD  Lab Results  Component Value Date   HGB 7.7 (L) 07/30/2019  discussed risks and benefits of starting EPO with patient and his wife Started on EPO. Monitor closely  #. SHPTH  No results found for: PTH Lab Results  Component Value Date   PHOS 6.6 (H) 08/01/2019  monitor level   #. Diabetes type 2 with CKD Hemoglobin A1C (%)  Date Value  07/19/2013 12.2 (H)   Hgb A1c MFr Bld (%)  Date Value  07/28/2019 7.2 (H)   #  Back pain Patient states that after his previous discharge he was just laying around in the bed too much.  Encouraged activity and sitting in the chair   LOS: Winifred 9/15/202012:12 [place], [place]

## 2019-08-01 NOTE — Plan of Care (Signed)
  Problem: Activity: Goal: Risk for activity intolerance will decrease Outcome: Progressing   Problem: Pain Managment: Goal: General experience of comfort will improve Outcome: Progressing   Problem: Safety: Goal: Ability to remain free from injury will improve Outcome: Progressing   

## 2019-08-02 DIAGNOSIS — D649 Anemia, unspecified: Secondary | ICD-10-CM

## 2019-08-02 DIAGNOSIS — I5042 Chronic combined systolic (congestive) and diastolic (congestive) heart failure: Secondary | ICD-10-CM

## 2019-08-02 LAB — RENAL FUNCTION PANEL
Albumin: 1.9 g/dL — ABNORMAL LOW (ref 3.5–5.0)
Anion gap: 11 (ref 5–15)
BUN: 91 mg/dL — ABNORMAL HIGH (ref 6–20)
CO2: 24 mmol/L (ref 22–32)
Calcium: 7.7 mg/dL — ABNORMAL LOW (ref 8.9–10.3)
Chloride: 99 mmol/L (ref 98–111)
Creatinine, Ser: 4.92 mg/dL — ABNORMAL HIGH (ref 0.61–1.24)
GFR calc Af Amer: 14 mL/min — ABNORMAL LOW (ref >60)
GFR calc non Af Amer: 12 mL/min — ABNORMAL LOW (ref >60)
Glucose, Bld: 164 mg/dL — ABNORMAL HIGH (ref 70–99)
Phosphorus: 6.8 mg/dL — ABNORMAL HIGH (ref 2.5–4.6)
Potassium: 4.4 mmol/L (ref 3.5–5.1)
Sodium: 134 mmol/L — ABNORMAL LOW (ref 135–145)

## 2019-08-02 LAB — HEMOGLOBIN: Hemoglobin: 7.6 g/dL — ABNORMAL LOW (ref 13.0–17.0)

## 2019-08-02 LAB — GLUCOSE, CAPILLARY
Glucose-Capillary: 163 mg/dL — ABNORMAL HIGH (ref 70–99)
Glucose-Capillary: 185 mg/dL — ABNORMAL HIGH (ref 70–99)
Glucose-Capillary: 192 mg/dL — ABNORMAL HIGH (ref 70–99)
Glucose-Capillary: 242 mg/dL — ABNORMAL HIGH (ref 70–99)

## 2019-08-02 MED ORDER — HYDROMORPHONE HCL 1 MG/ML IJ SOLN: 1.0000 mg | Freq: Four times a day (QID) | INTRAMUSCULAR | Status: DC | PRN

## 2019-08-02 MED ORDER — LIDOCAINE 5 % EX PTCH
1.0000 | MEDICATED_PATCH | CUTANEOUS | Status: DC
  Administered 2019-08-02 – 2019-08-08 (×7): 1 via TRANSDERMAL
  Filled 2019-08-02 (×7): qty 1

## 2019-08-02 MED ORDER — SODIUM CHLORIDE 0.9% FLUSH
10.0000 mL | Freq: Two times a day (BID) | INTRAVENOUS | Status: DC
  Administered 2019-08-02 – 2019-08-08 (×12): 10 mL via INTRAVENOUS

## 2019-08-02 NOTE — Progress Notes (Signed)
ID  Patient feeling better Pain in the back slightly better Participated with PT and walked a bit     Patient Vitals for the past 24 hrs:  BP Temp Temp src Pulse Resp SpO2 Weight  08/02/19 0803 136/63 98.5 F (36.9 C) Oral 76 14 95 % -  08/02/19 0502 (!) 128/53 99.1 F (37.3 C) Oral 77 14 96 % 128 kg  08/01/19 2001 (!) 105/58 98.6 F (37 C) Oral 72 16 96 % -  08/01/19 1536 122/62 98.4 F (36.9 C) Oral 74 17 95 % -   On examination awake and alert, pale, chest bilateral air entry Heart sounds S1-S2 Abdomen soft Edema legs Edema of the abdominal wall especially the back area Swelling lumbar area slightly reduced Tenderness reduced     CBC Latest Ref Rng & Units 08/02/2019 07/30/2019 07/29/2019  WBC 4.0 - 10.5 K/uL - - 12.4(H)  Hemoglobin 13.0 - 17.0 g/dL 7.6(L) 7.7(L) 7.0(L)  Hematocrit 39.0 - 52.0 % - - 22.0(L)  Platelets 150 - 400 K/uL - - 259    CMP Latest Ref Rng & Units 08/02/2019 08/01/2019 07/31/2019  Glucose 70 - 99 mg/dL 164(H) 197(H) 140(H)  BUN 6 - 20 mg/dL 91(H) 82(H) 74(H)  Creatinine 0.61 - 1.24 mg/dL 4.92(H) 4.59(H) 4.43(H)  Sodium 135 - 145 mmol/L 134(L) 134(L) 133(L)  Potassium 3.5 - 5.1 mmol/L 4.4 4.1 4.9  Chloride 98 - 111 mmol/L 99 100 100  CO2 22 - 32 mmol/L 24 24 23   Calcium 8.9 - 10.3 mg/dL 7.7(L) 8.1(L) 7.8(L)  Total Protein 6.5 - 8.1 g/dL - - -  Total Bilirubin 0.3 - 1.2 mg/dL - - -  Alkaline Phos 38 - 126 U/L - - -  AST 15 - 41 U/L - - -  ALT 0 - 44 U/L - - -     Impression/Recommendation  Soft tissue swelling lumbar area- Aspiration yielded bloody fluid and culture neg- so likely hematoma and no abscess.  Off antibiotic now He has dependent edema and he needs to participate in physical therapy and ambulate so as to reduce the edema from his back.  AKI on CKD.  Nephrology is following.  Acute hypoxic respiratory failure due to CHF  Anemia: Combination of CKD and possible hematoma  Discussed the management with the patient  ID will  sign off call if needed.

## 2019-08-02 NOTE — Progress Notes (Signed)
Spoken with wife twice since 2000 about patient's current condition. The spouse has tried calling the patient but the patient did not answer because he was sleep. I explained that to the wife. I told the wife I was in his room about 2000 to retrieve vitals. Wife is concerned about his sleeping and confusion. Patient was able to answer questions after I awaken him, patient is disoriented to the day. I requested that patient call and speak with his wife to update him about his condition. Patient is currently taking to wife on the phone.

## 2019-08-02 NOTE — Progress Notes (Signed)
Physical Therapy Treatment Patient Details Name: [name] [name] MRN: [id number] DOB: 08-07-1961 Today's Date: 08/02/2019    History of Present Illness 58 yo male with onset of SOB and ventricular tachycardia was admitted, has been given diuretic for CHF and pulm edema.  Pt has been having hypoxia, drops in BP and has become anemic needing transfusion.  Also having back pain with possible infection, has demand ischemia.  PMHx:  lumbar DJD, L4 foraminal stenosis, cardiomegaly, L foot toe amputations, TEE, PVD, DM, CABG, CHF, CKD, HTN, MI, PN.    PT Comments    Pt reporting not sleeping well last night but agreeable to PT session.  Pt reporting feeling "groggy" with initial 80 feet of ambulation with RW and with occasional mild unsteadiness so CGA provided for safety; pt sat down in recliner and BP noted to be 100/61 in sitting and when pt stood up pt's BP increased to 108/61.  Pt then requesting to walk again and ambulated 120 feet with RW CGA and appearing steady; pt's BP was 91/57 end of ambulation (pt reported feeling mild "medication" related dizziness but not his typical orthostatic hypotension dizziness).  After pt layed down in bed, pt's BP 107/67.  Pt reporting his back felt good with walking but pt's back pain increased from 6/10 beginning of session to 9/10 end of session resting in bed (pt given pain meds prior to session).  Pt's wife present end of session asking therapy related questions (pt gave therapist permission to update his wife on his physical therapy and answer her questions).  Will continue to focus on strengthening, balance, and progressive functional mobility during hospitalization.   Follow Up Recommendations  Home health PT;Supervision for mobility/OOB     Equipment Recommendations  Rolling walker with 5" wheels;3in1 (PT)    Recommendations for Other Services OT consult     Precautions / Restrictions Precautions Precautions: Fall Precaution Comments: monitor BP and  O2 sats Restrictions Weight Bearing Restrictions: No    Mobility  Bed Mobility   Bed Mobility: Supine to Sit;Sit to Supine(vc's for logrolling)     Supine to sit: Modified independent (Device/Increase time) Sit to supine: Modified independent (Device/Increase time)   General bed mobility comments: pt able to perform with mild increased effort  Transfers Overall transfer level: Needs assistance Equipment used: Rolling walker (2 wheeled) Transfers: Sit to/from Stand Sit to Stand: Min guard         General transfer comment: x1 trial from bed and x1 trial from recliner; mild increased effort to stand but steady  Ambulation/Gait Ambulation/Gait assistance: Min guard Gait Distance (Feet): (80 feet; 120 feet) Assistive device: Rolling walker (2 wheeled)   Gait velocity: mildly decreased   General Gait Details: partial step through gait pattern; initially appearing occasionally mildly unsteady but no loss of balance noted during first 80 feet of ambulation; pt appearing steady next 120 feet ambulation trial   Stairs             Wheelchair Mobility    Modified Rankin (Stroke Patients Only)       Balance Overall balance assessment: Needs assistance;History of Falls Sitting-balance support: No upper extremity supported;Feet supported Sitting balance-Leahy Scale: Normal Sitting balance - Comments: steady sitting reaching outside BOS   Standing balance support: No upper extremity supported Standing balance-Leahy Scale: Good Standing balance comment: steady standing reaching within BOS  Cognition Arousal/Alertness: Awake/alert Behavior During Therapy: (Appearing tired and "groggy"--pt reports from pain medication) Overall Cognitive Status: Within Functional Limits for tasks assessed                                        Exercises      General Comments   Nursing cleared pt for participation in physical  therapy.  Pt agreeable to PT session.      Pertinent Vitals/Pain Pain Assessment: 0-10 Pain Location: low back Pain Descriptors / Indicators: Sore Pain Intervention(s): Limited activity within patient's tolerance;Monitored during session;Premedicated before session;Repositioned;Other (comment)(RN notified)  HR and O2 on room air WFL during session's activities.    Home Living                      Prior Function            PT Goals (current goals can now be found in the care plan section) Acute Rehab PT Goals Patient Stated Goal: get pain managed PT Goal Formulation: With patient Time For Goal Achievement: 08/12/19 Potential to Achieve Goals: Good Progress towards PT goals: Progressing toward goals    Frequency    Min 2X/week      PT Plan Current plan remains appropriate    Co-evaluation              AM-PAC PT "6 Clicks" Mobility   Outcome Measure  Help needed turning from your back to your side while in a flat bed without using bedrails?: None Help needed moving from lying on your back to sitting on the side of a flat bed without using bedrails?: None Help needed moving to and from a bed to a chair (including a wheelchair)?: A Little Help needed standing up from a chair using your arms (e.g., wheelchair or bedside chair)?: A Little Help needed to walk in hospital room?: A Little Help needed climbing 3-5 steps with a railing? : A Little 6 Click Score: 20    End of Session Equipment Utilized During Treatment: Gait belt Activity Tolerance: Patient limited by pain Patient left: in bed;with call bell/phone within reach;with bed alarm set;with family/visitor present Nurse Communication: Mobility status;Precautions;Other (comment)(pt's pain status and BP) PT Visit Diagnosis: Other abnormalities of gait and mobility (R26.89);Unsteadiness on feet (R26.81);Ataxic gait (R26.0);Difficulty in walking, not elsewhere classified (R26.2);Dizziness and giddiness  (R42)     Time: 6389-3734 PT Time Calculation (min) (ACUTE ONLY): 45 min  Charges:  $Gait Training: 8-22 mins $Therapeutic Exercise: 8-22 mins $Therapeutic Activity: 8-22 mins                    Leitha Bleak, PT 08/02/19, 12:34 PM [phone]

## 2019-08-02 NOTE — Plan of Care (Signed)
  Problem: Activity: Goal: Risk for activity intolerance will decrease Outcome: Progressing   Problem: Elimination: Goal: Will not experience complications related to bowel motility Outcome: Progressing   Problem: Pain Managment: Goal: General experience of comfort will improve Outcome: Progressing   Problem: Safety: Goal: Ability to remain free from injury will improve Outcome: Progressing   

## 2019-08-02 NOTE — Progress Notes (Signed)
Progress Note  Patient Name: [name] [name] Date of Encounter: 08/02/2019  Primary Cardiologist: Ida Rogue, MD  Subjective   No chest pain or dyspnea.  He notes abdominal bloating but says this is chronic.  His wife is noted that he has been somewhat confused this morning.  Inpatient Medications    Scheduled Meds: . atorvastatin  40 mg Oral QHS  . carvedilol  25 mg Oral BID WC  . docusate sodium  100 mg Oral BID  . epoetin (EPOGEN/PROCRIT) injection  10,000 Units Subcutaneous Weekly  . famotidine  20 mg Oral QHS  . ferrous sulfate  325 mg Oral Daily  . gabapentin  100 mg Oral TID  . hydrALAZINE  25 mg Oral BID  . insulin aspart  0-5 Units Subcutaneous QHS  . insulin aspart  0-9 Units Subcutaneous TID WC  . isosorbide mononitrate  30 mg Oral Daily  . lidocaine  1 patch Transdermal Q24H  . polyethylene glycol  17 g Oral Daily  . sodium bicarbonate  1,300 mg Oral BID   Continuous Infusions: . sodium chloride Stopped (08/01/19 0106)   PRN Meds: sodium chloride, acetaminophen **OR** acetaminophen, HYDROmorphone (DILAUDID) injection, magnesium hydroxide, nitroGLYCERIN, ondansetron **OR** ondansetron (ZOFRAN) IV, oxyCODONE   Vital Signs    Vitals:   08/01/19 1536 08/01/19 2001 08/02/19 0502 08/02/19 0803  BP: 122/62 (!) 105/58 (!) 128/53 136/63  Pulse: 74 72 77 76  Resp: 17 16 14 14   Temp: 98.4 F (36.9 C) 98.6 F (37 C) 99.1 F (37.3 C) 98.5 F (36.9 C)  TempSrc: Oral Oral Oral Oral  SpO2: 95% 96% 96% 95%  Weight:   128 kg   Height:        Intake/Output Summary (Last 24 hours) at 08/02/2019 1332 Last data filed at 08/02/2019 1024 Gross per 24 hour  Intake 240 ml  Output 3550 ml  Net -3310 ml   Filed Weights   07/31/19 0330 08/01/19 0417 08/02/19 0502  Weight: 125.6 kg 126.8 kg 128 kg    Physical Exam   GEN: Obese, in no acute distress.  HEENT: Grossly normal.  Neck: Supple, no JVD, carotid bruits, or masses. Cardiac: RRR, distant heart sounds, no  murmurs, rubs, or gallops. No clubbing, cyanosis.  1+ left lower extremity edema and trace right lower extremity edema.  Radials/DP/PT 2+ and equal bilaterally.  Respiratory:  Respirations regular and unlabored, diminished breath sounds bilaterally. GI: Obese, soft, nontender, nondistended, BS + x 4. MS: no deformity or atrophy. Skin: warm and dry, no rash. Neuro:  Strength and sensation are intact. Psych: AAOx3.  Normal affect.  Labs    Chemistry Recent Labs  Lab 07/31/19 0524 08/01/19 1026 08/02/19 0443  NA 133* 134* 134*  K 4.9  4.8 4.1 4.4  CL 100 100 99  CO2 23 24 24   GLUCOSE 140* 197* 164*  BUN 74* 82* 91*  CREATININE 4.43* 4.59* 4.92*  CALCIUM 7.8* 8.1* 7.7*  ALBUMIN 1.9* 2.1* 1.9*  GFRNONAA 14* 13* 12*  GFRAA 16* 15* 14*  ANIONGAP 10 10 11      Hematology Recent Labs  Lab 07/27/19 1711 07/29/19 0520 07/30/19 0634 08/02/19 0443  WBC 14.5* 12.4*  --   --   RBC 2.71* 2.40*  --   --   HGB 8.0* 7.0* 7.7* 7.6*  HCT 25.0* 22.0*  --   --   MCV 92.3 91.7  --   --   MCH 29.5 29.2  --   --   MCHC  32.0 31.8  --   --   RDW 13.1 13.2  --   --   PLT 309 259  --   --     Cardiac Enzymes  Recent Labs  Lab 07/27/19 1711 07/27/19 1909 07/28/19 0303 07/28/19 0547  TROPONINIHS 32* 55* 35* 31*       Radiology    No results found.  Telemetry    Not currently on telemetry - Personally Reviewed  Cardiac Studies   2D Echocardiogram 8.13.2020  IMPRESSIONS      1. The left ventricle has moderate-severely reduced systolic function, with an ejection fraction of 30-35%. The cavity size was normal. There is mildly increased left ventricular wall thickness. Left ventricular diastolic Doppler parameters are  consistent with pseudonormalization. Left ventricular diffuse hypokinesis.  2. The right ventricle has normal systolic function. The cavity was normal. There is no increase in right ventricular wall thickness. Unable to estimate RVSP.  3. Left atrial size was  moderately dilated. _____________   Elwyn Reach 10/2018: Normal sinus rhythm avg HR of 81 bpm.  8 Ventricular Tachycardia runs occurred, the run with the fastest interval lasting 4 beats with a max rate of 200 bpm,  the longest lasting 17 beats with an avg rate of 120 bpm.   7 Supraventricular Tachycardia runs occurred,  the run with the fastest interval lasting 5 beats with a max rate of 148 bpm,  the longest lasting 13 beats with an avg rate of 110 bpm.   Isolated SVEs were rare (<1.0%), SVE Couplets were rare (<1.0%), and SVE Triplets were rare (<1.0%).  Isolated VEs were rare (<1.0%), VE Couplets were rare (<1.0%), and no VE Triplets were present. Ventricular Trigeminy was present. _____________   Myoview 09/2018: Pharmacological myocardial perfusion imaging study with large region of fixed inferior and inferolateral wall perfusion defect consistent with previous MI Very mild ischemia in the inferolateral wall (peri-infarct ischemia) Hypokinesis noted in the inferior wall. EF estimated at 36% No EKG changes concerning for ischemia at peak stress or in recovery. Moderate risk scan Similar findings on the study noted in 02/2018 and several years ago.  Patient Profile     58 y.o. male with a history of CAD status post four-vessel bypass in June 2015, HFrEF/ischemic cardiomyopathy with an EF of 30 to 35%, nonsustained VT, paroxysmal SVT, PVD, diabetes mellitus, peripheral neuropathy, osteomyelitis, stage IV chronic kidney disease, hypertension, hyperlipidemia, orthostatic hypotension, and noncompliance, who was admitted September 10, with severe low back pain and dyspnea with volume overload.  Assessment & Plan    1.  Low back pain/soft tissue hematoma: He did not complain of back pain on our visit this morning.  Pain management per internal medicine.  No longer on antibiotics.  Physical therapy recommended.  2.  Chronic combined systolic and diastolic ingestive heart failure/ischemic  cardiomyopathy: Patient has had significant weight gain since his August office visit-18 kg since then.  It is notable that his weight was up 7-1/2 kg just 4 days after his last cardiology visit and thus I wonder if the August 20 weight was spurious.  Regardless, his weight is elevated above prior baseline and has climbed since admission.  Historically, he notes increasing abdominal girth with volume excess and he does complain of that this admission.  He also has mild left greater than right lower extremity edema.  Unfortunately, his creatinine continues to climb and diuretic therapy remains on hold.  He is being followed closely by nephrology with a plan to consider dialysis  if creatinine does not improve off of diuretic therapy.  He remains on beta-blocker, hydralazine, and nitrate therapy.  No ACE/ARB/ARNI/MRA in the setting of renal failure.  3.  Coronary artery disease: Status post prior four-vessel bypass with low to intermediate risk stress testing in April 2019.  He denies chest pain.  High-sensitivity troponin was mildly elevated to a peak of 55 though trend overall was flat and more reflective of demand ischemia in the setting of CHF.  Continue current medical regimen including statin, beta-blocker, and nitrate.  He is on aspirin at home and this is on hold in the setting of soft tissue hematoma with ongoing anemia.  4.  Acute on chronic stage IV kidney disease: Diuretics on hold for the past 24 hours and BUN/creatinine have risen further.  Nephrology following.  Continue to hold diuretics.  Dialysis to be given consideration if things do not improve.  5.  Essential hypertension: Blood pressure stable on current regimen.  6.  Hyperlipidemia: Continue statin therapy.  I do not see any recent lipids in our system or in care everywhere.  This can be followed up as an outpatient.  7.  Normocytic anemia: For transfusion today per internal medicine.  Aspirin remains on hold.  8.  Type 2 diabetes  mellitus: A1c 7.2.  Signed scale insulin per internal medicine.  Signed, Murray Hodgkins, NP  08/02/2019, 1:32 PM    For questions or updates, please contact   Please consult [email or web address] for contact info under Cardiology/STEMI.

## 2019-08-02 NOTE — Progress Notes (Signed)
[facility], [place] 08/02/19  Subjective:   LOS: 56 09/15 0701 - 09/16 0700 In: 78 [P.O.:240] Out: 3000 [Urine:3000]  Patient's wife is at bedside Reports some confusion this morning Receiving IV narcotics as pain medication.  Walk around the nursing station today Appetite is fair Serum creatinine is worse today   Objective:  Vital signs in last 24 hours:  Temp:  [98.4 F (36.9 C)-99.1 F (37.3 C)] 98.5 F (36.9 C) (09/16 0803) Pulse Rate:  [72-77] 76 (09/16 0803) Resp:  [14-17] 14 (09/16 0803) BP: (105-136)/(53-63) 136/63 (09/16 0803) SpO2:  [95 %-96 %] 95 % (09/16 0803) Weight:  [681 kg] 128 kg (09/16 0502)  Weight change: 1.179 kg Filed Weights   07/31/19 0330 08/01/19 0417 08/02/19 0502  Weight: 125.6 kg 126.8 kg 128 kg    Intake/Output:    Intake/Output Summary (Last 24 hours) at 08/02/2019 1237 Last data filed at 08/02/2019 1024 Gross per 24 hour  Intake 240 ml  Output 3550 ml  Net -3310 ml   Physical Exam: Gen:   Alert, cooperative, no distress, appears stated age Head:   Normocephalic, without obvious abnormality, atraumatic Eyes/ENT:  conjunctiva clear,  moist oral mucus membranes Neck:  Supple,   Lungs:   Clear to auscultation bilaterally, respirations unlabored Heart:   No rub  Abdomen:   Soft, non-tender  Extremities: no cyanosis , 1+ b/l edema Skin:  Skin color, texture, turgor normal, no rashes or lesions Neurologic: Alert, some confusion with specific questions    Basic Metabolic Panel:  Recent Labs  Lab 07/27/19 1711 07/28/19 0303 07/28/19 0547 07/29/19 0520 07/31/19 0524 08/01/19 1026 08/02/19 0443  NA 134*  --   --  136 133* 134* 134*  K 4.7  --   --  4.5 4.9  4.8 4.1 4.4  CL 106  --   --  106 100 100 99  CO2 20*  --   --  22 23 24 24   GLUCOSE 344*  --   --  153* 140* 197* 164*  BUN 69*  --   --  66* 74* 82* 91*  CREATININE 3.55*  --  3.39* 3.45* 4.43* 4.59* 4.92*  CALCIUM 8.3*  --   --  7.8*  7.8* 8.1* 7.7*  MG  --  2.3  --   --  2.1  --   --   PHOS  --   --   --   --  6.2* 6.6* 6.8*     CBC: Recent Labs  Lab 07/27/19 1711 07/29/19 0520 07/30/19 0634 08/02/19 0443  WBC 14.5* 12.4*  --   --   HGB 8.0* 7.0* 7.7* 7.6*  HCT 25.0* 22.0*  --   --   MCV 92.3 91.7  --   --   PLT 309 259  --   --      No results found for: HEPBSAG, HEPBSAB, HEPBIGM    Microbiology:  Recent Results (from the past 240 hour(s))  SARS Coronavirus 2 [facility] order, Performed in [place] hospital lab) Nasopharyngeal Nasopharyngeal Swab     Status: None   Collection Time: 07/27/19 10:36 PM   Specimen: Nasopharyngeal Swab  Result Value Ref Range Status   SARS Coronavirus 2 NEGATIVE NEGATIVE Final    Comment: (NOTE) If result is NEGATIVE SARS-CoV-2 target nucleic acids are NOT DETECTED. The SARS-CoV-2 RNA is generally detectable in upper and lower  respiratory specimens during the acute phase of infection. The lowest  concentration of SARS-CoV-2 viral  copies this assay can detect is 250  copies / mL. A negative result does not preclude SARS-CoV-2 infection  and should not be used as the sole basis for treatment or other  patient management decisions.  A negative result may occur with  improper specimen collection / handling, submission of specimen other  than nasopharyngeal swab, presence of viral mutation(s) within the  areas targeted by this assay, and inadequate number of viral copies  (<250 copies / mL). A negative result must be combined with clinical  observations, patient history, and epidemiological information. If result is POSITIVE SARS-CoV-2 target nucleic acids are DETECTED. The SARS-CoV-2 RNA is generally detectable in upper and lower  respiratory specimens dur ing the acute phase of infection.  Positive  results are indicative of active infection with SARS-CoV-2.  Clinical  correlation with patient history and other diagnostic information is  necessary to determine  patient infection status.  Positive results do  not rule out bacterial infection or co-infection with other viruses. If result is PRESUMPTIVE POSTIVE SARS-CoV-2 nucleic acids MAY BE PRESENT.   A presumptive positive result was obtained on the submitted specimen  and confirmed on repeat testing.  While 2019 novel coronavirus  (SARS-CoV-2) nucleic acids may be present in the submitted sample  additional confirmatory testing may be necessary for epidemiological  and / or clinical management purposes  to differentiate between  SARS-CoV-2 and other Sarbecovirus currently known to infect humans.  If clinically indicated additional testing with an alternate test  methodology [phone]) is advised. The SARS-CoV-2 RNA is generally  detectable in upper and lower respiratory sp ecimens during the acute  phase of infection. The expected result is Negative. Fact Sheet for Patients:  StrictlyIdeas.no Fact Sheet for Healthcare Providers: BankingDealers.co.za This test is not yet approved or cleared by the [place] FDA and has been authorized for detection and/or diagnosis of SARS-CoV-2 by FDA under an Emergency Use Authorization (EUA).  This EUA will remain in effect (meaning this test can be used) for the duration of the COVID-19 declaration under Section 564(b)(1) of the Act, 21 U.S.C. section 360bbb-3(b)(1), unless the authorization is terminated or revoked sooner. Performed at [facility], [address] [place], [place] [address]   CULTURE, BLOOD (ROUTINE X 2) w Reflex to ID Panel     Status: None (Preliminary result)   Collection Time: 07/30/19  6:55 PM   Specimen: BLOOD  Result Value Ref Range Status   Specimen Description BLOOD RIGHT ANTECUBITAL  Final   Special Requests   Final    BOTTLES DRAWN AEROBIC AND ANAEROBIC Blood Culture adequate volume   Culture   Final    NO GROWTH 3 DAYS Performed at [facility], [address] [place], [place] [address]    Report Status PENDING  Incomplete  CULTURE, BLOOD (ROUTINE X 2) w Reflex to ID Panel     Status: None (Preliminary result)   Collection Time: 07/30/19  6:55 PM   Specimen: BLOOD  Result Value Ref Range Status   Specimen Description BLOOD RIGHT HAND  Final   Special Requests   Final    BOTTLES DRAWN AEROBIC AND ANAEROBIC Blood Culture adequate volume   Culture   Final    NO GROWTH 3 DAYS Performed at [facility], [address] [place], [place] [address]    Report Status PENDING  Incomplete  Aerobic/Anaerobic Culture (surgical/deep wound)     Status: None (Preliminary result)   Collection Time: 07/31/19 11:35 AM  Specimen: Wound; Tissue  Result Value Ref Range Status   Specimen Description   Final    WOUND RIGHT LATERAL L4 SPINE Performed at [facility] [place] [address], [place], [place] [address]    Special Requests   Final    Normal Performed at [facility], [place], [place] [address]    Gram Stain NO WBC SEEN NO ORGANISMS SEEN   Final   Culture   Final    NO GROWTH 2 DAYS NO ANAEROBES ISOLATED; CULTURE IN PROGRESS FOR 5 DAYS Performed at [facility], [place] [address] [place], [place] [address]    Report Status PENDING  Incomplete    Coagulation Studies: No results for input(s): LABPROT, INR in the last 72 hours.  Urinalysis: No results for input(s): COLORURINE, LABSPEC, PHURINE, GLUCOSEU, HGBUR, BILIRUBINUR, KETONESUR, PROTEINUR, UROBILINOGEN, NITRITE, LEUKOCYTESUR in the last 72 hours.  Invalid input(s): APPERANCEUR    Imaging: No results found.   Medications:   . sodium chloride Stopped (08/01/19 0106)   . atorvastatin  40 mg Oral QHS  . carvedilol  25 mg Oral BID WC  . docusate sodium  100 mg Oral BID  . epoetin (EPOGEN/PROCRIT) injection  10,000 Units Subcutaneous Weekly  . famotidine  20 mg Oral QHS  . ferrous sulfate  325 mg Oral Daily  . gabapentin  100 mg  Oral TID  . hydrALAZINE  25 mg Oral BID  . insulin aspart  0-5 Units Subcutaneous QHS  . insulin aspart  0-9 Units Subcutaneous TID WC  . isosorbide mononitrate  30 mg Oral Daily  . lidocaine  1 patch Transdermal Q24H  . polyethylene glycol  17 g Oral Daily  . sodium bicarbonate  1,300 mg Oral BID   sodium chloride, acetaminophen **OR** acetaminophen, HYDROmorphone (DILAUDID) injection, magnesium hydroxide, nitroGLYCERIN, ondansetron **OR** ondansetron (ZOFRAN) IV, oxyCODONE  Assessment/ Plan:  58 y.o. male with  hypertension, diabetes mellitus type II, diabetic neuropathy, systolic congestive heart failure, peripheral vascular disease status post left toe amputations, autonomic neuropathy, coronary artery disease status post CABG, history of osteomyelitis, who has been admitted to [facility] on 07/27/2019 for acute exacerbation of congestive heart failure.   Active Problems:   Acute on chronic combined systolic and diastolic CHF (congestive heart failure) (HCC)   Acute on chronic respiratory failure with hypoxemia (HCC)   Acute midline low back pain without sciatica   #.AKI on  CKD st 4 Recent Labs    07/29/19 0520 07/31/19 0524 08/01/19 1026 08/02/19 0443  CREATININE 3.45* 4.43* 4.59* 4.92*  Baseline Cr 2.42 from April 2020/ GFR 29. Cause of AKI is unclear at present, but likely cardiorenal & ATN from excessive diuresis  - antibiotics have been discontinued -Serum creatinine is noted to be slightly worse today.  Patient is nonoliguric.  Urine output 3000 cc. -Diuresis is on hold - Electrolytes and Volume status are acceptable - No acute indication for Dialysis at present   #. Anemia of CKD  Lab Results  Component Value Date   HGB 7.6 (L) 08/02/2019  discussed risks and benefits of starting EPO with patient and his wife Started on EPO. Monitor closely  #. SHPTH  No results found for: PTH Lab Results  Component Value Date   PHOS 6.8 (H) 08/02/2019  monitor level   #.  Diabetes type 2 with CKD Hemoglobin A1C (%)  Date Value  07/19/2013 12.2 (H)   Hgb A1c MFr Bld (%)  Date Value  07/28/2019 7.2 (  H)   #  Back pain Consider local lidocaine patches Surgical team is following along.  No indication for I&D   LOS: [place] 9/16/202012:37 [place], [place]

## 2019-08-02 NOTE — Progress Notes (Signed)
[place] [facility] [place] NAME: [name] [name]    MR#:  [id number]  DATE OF BIRTH:  09/08/1961  SUBJECTIVE:   Denies any complaints other than low back pain. He is able to move all extremities well. No bowel or urinary incontinence. he did very well with physical therapy REVIEW OF SYSTEMS:   Review of Systems  Constitutional: Negative for chills, fever and weight loss.  HENT: Negative for ear discharge, ear pain and nosebleeds.   Eyes: Negative for blurred vision, pain and discharge.  Respiratory: Positive for shortness of breath. Negative for sputum production, wheezing and stridor.   Cardiovascular: Positive for leg swelling. Negative for chest pain, palpitations, orthopnea and PND.  Gastrointestinal: Negative for abdominal pain, diarrhea, nausea and vomiting.  Genitourinary: Negative for frequency and urgency.  Musculoskeletal: Positive for back pain. Negative for joint pain.  Neurological: Positive for weakness. Negative for sensory change, speech change and focal weakness.  Psychiatric/Behavioral: Negative for depression and hallucinations. The patient is not nervous/anxious.    Tolerating Diet:yes Tolerating PT: health  DRUG ALLERGIES:   Allergies  Allergen Reactions  . Other Anaphylaxis and Other (See Comments)    Pt. And wife state that he had a reaction to "some" antibiotic but they do not know what the name of it was.They are are very apprehensive about him receiving any antibiotic. Zosyn, Clindamycin, & Vancomycin (all started together unsure which caused reaction 07/18/2013) 07/30/19- Allergic interstitial nephritis to Unasyn/zosyn in 2014 needing renal biopsy     VITALS:  Blood pressure 136/63, pulse 76, temperature 98.5 F (36.9 C), temperature source Oral, resp. rate 14, height 6\' 1"  (1.854 m), weight 128 kg, SpO2 95 %.  PHYSICAL EXAMINATION:   Physical Exam  GENERAL:  58 y.o.-year-old patient lying in the bed with  no acute distress. He is obese, chronically ill EYES: Pupils equal, round, reactive to light and accommodation. No scleral icterus. Extraocular muscles intact.  HEENT: Head atraumatic, normocephalic. Oropharynx and nasopharynx clear.  NECK:  Supple, no jugular venous distention. No thyroid enlargement, no tenderness.  LUNGS: decreased breath sounds bilaterally, no wheezing, rales, rhonchi. No use of accessory muscles of respiration.  CARDIOVASCULAR: S1, S2 normal. No murmurs, rubs, or gallops.  ABDOMEN: Soft, nontender, nondistended. Bowel sounds present. No organomegaly or mass.  EXTREMITIES: No cyanosis, clubbing  + edema b/l.    NEUROLOGIC: Cranial nerves II through XII are intact. No focal Motor or sensory deficits b/l. Some tenderness in the lower back. PSYCHIATRIC:  patient is alert and oriented x 3.  SKIN: mild redness in the lumbar area marked  LABORATORY PANEL:  CBC Recent Labs  Lab 07/29/19 0520  08/02/19 0443  WBC 12.4*  --   --   HGB 7.0*   < > 7.6*  HCT 22.0*  --   --   PLT 259  --   --    < > = values in this interval not displayed.    Chemistries  Recent Labs  Lab 07/31/19 0524  08/02/19 0443  NA 133*   < > 134*  K 4.9  4.8   < > 4.4  CL 100   < > 99  CO2 23   < > 24  GLUCOSE 140*   < > 164*  BUN 74*   < > 91*  CREATININE 4.43*   < > 4.92*  CALCIUM 7.8*   < > 7.7*  MG 2.1  --   --    < > =  values in this interval not displayed.   Cardiac Enzymes No results for input(s): TROPONINI in the last 168 hours. RADIOLOGY:  No results found. ASSESSMENT AND PLAN:  Dreshaun Stene Murphyis a 58 y.o.malewith a hx of CAD s/p four-vessel CABG (04/2014), HFrEF secondary to ICM, NSVT, paroxysmal SVT, PVD, poorly controlled insulin-dependent diabetes with diabetic foot ulcers and diabetic peripheral neuropathyrequiring amputations of the left toes, osteomyelitis, CKD stage IV, hypertension, hyperlipidemia  1. Acute on chronic systolic dysfunction with history of ischemic  cardiomyopathy -was on IV Lasix 40 mg BID--discontinued ---stop lasix due to rising creat --UOP 9.2 liters. Clinically looks better. sats 95% on RA -monitor input output, creatinine 3.45--4.43--4.61--4.92 -appreciate cardiology and nephrology input -low sodium diet -patient has history of coronary artery disease status post CABG  2. Low back pain acute --MRI of the lumbar spine shows Large round 7 centimeter nonspecific soft tissue area  in the subcutaneous fat overlying the L4 vertebra to the right of midline.-This could be an infectious phlegmon or a hematoma.--more favoring hemtoam per neurosx - ID consulted.-- was on IV cefazolin-- discontinued --okay to start physical therapy per neurosurgery -status post ultrasound guided drain of bloody fluid by IR on September 14th-- fluid culture negative  3. Acute on chronic kidney disease stage IV -appreciate Dr. Assunta Gambles input -monitor input output and creatinine -avoid nephrotoxic ends -continue PO bicarb -hold lasix -creat rising 4.6--4.92  4. Acute on chronic anemia secondary to kidney disease, questionable hematoma in the back -will transfuse one unit today -came in with hemoglobin of 9.2--- 7.0-- one unit blood transfusion---7.7--7.6 -SCD -hold aspirin  5. Type II diabetes sliding scale insulin  6. Hyperlipidemia continue statins  Spoke with Mrs. [name] and updated today.  Management plans discussed with the patient, family and they are in agreement.  CODE STATUS: full  DVT Prophylaxis: SCD  TOTAL TIME TAKING CARE OF THIS PATIENT: *30* minutes.  >50% time spent on counselling and coordination of care  POSSIBLE D/C IN *?* DAYS, DEPENDING ON CLINICAL CONDITION.  Note: This dictation was prepared with Dragon dictation along with smaller phrase technology. Any transcriptional errors that result from this process are unintentional.  Fritzi Mandes M.D on 08/02/2019 at 12:59 PM  Between 7am to 6pm - Pager -  [phone]  After 6pm go to [email or web address] - password [email or web address] [facility]  Office  [phone]  CC: Primary care physician; Tracie Harrier, MDPatient ID: [name], male   DOB: 1961/04/21, 58 y.o.   MRN: [id number]

## 2019-08-02 NOTE — Progress Notes (Signed)
[facility] SURGICAL PROGRESS NOTE (cpt [phone])  Hospital Day(s): 5.   Interval History: Patient seen and examined, no acute events or new complaints overnight. Patient reports persistent lower back soreness but this is improving. No fever or chills. He does endorse similar back pain prior to admission and his fall from bed. No other acute complaints this morning.   Review of Systems:  Constitutional: denies fever, chills  HEENT: denies cough or congestion  Respiratory: denies any shortness of breath  Cardiovascular: denies chest pain or palpitations  Gastrointestinal: denies abdominal pain, N/V, or diarrhea/and bowel function as per interval history Genitourinary: denies burning with urination or urinary frequency Musculoskeletal: + back pain  Vital signs in last 24 hours: [min-max] current  Temp:  [98.4 F (36.9 C)-99.1 F (37.3 C)] 98.5 F (36.9 C) (09/16 0803) Pulse Rate:  [72-77] 76 (09/16 0803) Resp:  [14-17] 14 (09/16 0803) BP: (105-136)/(53-63) 136/63 (09/16 0803) SpO2:  [95 %-96 %] 95 % (09/16 0803) Weight:  [967 kg] 128 kg (09/16 0502)     Height: 6\' 1"  (185.4 cm) Weight: 128 kg BMI (Calculated): 37.23   Intake/Output last 2 shifts:  09/15 0701 - 09/16 0700 In: 240 [P.O.:240] Out: 3000 [Urine:3000]   Physical Exam:  Constitutional: alert, cooperative and no distress  HENT: normocephalic without obvious abnormality  Eyes: PERRL, EOM's grossly intact and symmetric  Respiratory: breathing non-labored at rest  Integumentary: There is an area or erythema and tenderness to the midline lumbar back, induration palpable, no fluctuance or crepitus, no drainage.     Labs:  CBC Latest Ref Rng & Units 07/30/2019 07/29/2019 07/27/2019  WBC 4.0 - 10.5 K/uL - 12.4(H) 14.5(H)  Hemoglobin 13.0 - 17.0 g/dL 7.7(L) 7.0(L) 8.0(L)  Hematocrit 39.0 - 52.0 % - 22.0(L) 25.0(L)  Platelets 150 - 400 K/uL - 259 309   CMP Latest Ref Rng & Units 08/02/2019 08/01/2019 07/31/2019   Glucose 70 - 99 mg/dL 164(H) 197(H) 140(H)  BUN 6 - 20 mg/dL 91(H) 82(H) 74(H)  Creatinine 0.61 - 1.24 mg/dL 4.92(H) 4.59(H) 4.43(H)  Sodium 135 - 145 mmol/L 134(L) 134(L) 133(L)  Potassium 3.5 - 5.1 mmol/L 4.4 4.1 4.9  Chloride 98 - 111 mmol/L 99 100 100  CO2 22 - 32 mmol/L 24 24 23   Calcium 8.9 - 10.3 mg/dL 7.7(L) 8.1(L) 7.8(L)  Total Protein 6.5 - 8.1 g/dL - - -  Total Bilirubin 0.3 - 1.2 mg/dL - - -  Alkaline Phos 38 - 126 U/L - - -  AST 15 - 41 U/L - - -  ALT 0 - 44 U/L - - -     Imaging studies: No new pertinent imaging studies   Assessment/Plan: (ICD-10's: L60.312) 58 y.o. male with most likely hematoma with improving possible overlaying cellulitis to the lower back without appreciable abscess on examination and no evidence of necrotizing infection, complicated by multiple comorbid conditions   - No indication for I&D or surgical intervention at this time; improving; general surgery will sign-off   - Further management per primary and consulting services  All of the above findings and recommendations were discussed with the patient, and the medical team, and all of patient's questions were answered to his expressed satisfaction.  -- Edison Simon, PA-C [facility] 08/02/2019, 9:27 AM [phone] M-F: 7am - 4pm

## 2019-08-03 ENCOUNTER — Other Ambulatory Visit (INDEPENDENT_AMBULATORY_CARE_PROVIDER_SITE_OTHER): Payer: Self-pay | Admitting: Vascular Surgery

## 2019-08-03 DIAGNOSIS — T148XXA Other injury of unspecified body region, initial encounter: Secondary | ICD-10-CM

## 2019-08-03 LAB — RENAL FUNCTION PANEL
Albumin: 1.9 g/dL — ABNORMAL LOW (ref 3.5–5.0)
Anion gap: 11 (ref 5–15)
BUN: 107 mg/dL — ABNORMAL HIGH (ref 6–20)
CO2: 22 mmol/L (ref 22–32)
Calcium: 7.6 mg/dL — ABNORMAL LOW (ref 8.9–10.3)
Chloride: 100 mmol/L (ref 98–111)
Creatinine, Ser: 5.19 mg/dL — ABNORMAL HIGH (ref 0.61–1.24)
GFR calc Af Amer: 13 mL/min — ABNORMAL LOW (ref >60)
GFR calc non Af Amer: 11 mL/min — ABNORMAL LOW (ref >60)
Glucose, Bld: 286 mg/dL — ABNORMAL HIGH (ref 70–99)
Phosphorus: 5.7 mg/dL — ABNORMAL HIGH (ref 2.5–4.6)
Potassium: 4.6 mmol/L (ref 3.5–5.1)
Sodium: 133 mmol/L — ABNORMAL LOW (ref 135–145)

## 2019-08-03 LAB — GLUCOSE, CAPILLARY
Glucose-Capillary: 226 mg/dL — ABNORMAL HIGH (ref 70–99)
Glucose-Capillary: 226 mg/dL — ABNORMAL HIGH (ref 70–99)
Glucose-Capillary: 210 mg/dL — ABNORMAL HIGH (ref 70–99)
Glucose-Capillary: 217 mg/dL — ABNORMAL HIGH (ref 70–99)

## 2019-08-03 MED ORDER — OXYCODONE HCL 5 MG PO TABS
10.0000 mg | ORAL_TABLET | Freq: Four times a day (QID) | ORAL | Status: DC | PRN
  Administered 2019-08-03 – 2019-08-07 (×7): 10 mg via ORAL
  Filled 2019-08-03 (×7): qty 2

## 2019-08-03 MED ORDER — SENNOSIDES-DOCUSATE SODIUM 8.6-50 MG PO TABS
1.0000 | ORAL_TABLET | Freq: Two times a day (BID) | ORAL | Status: DC
  Administered 2019-08-03 – 2019-08-08 (×9): 1 via ORAL
  Filled 2019-08-03 (×9): qty 1

## 2019-08-03 NOTE — Consult Note (Signed)
[facility] Vascular Consult Note  MRN : [id number]  [name] [name] is a 58 y.o. (06/14/1961) male who presents with chief complaint of  Chief Complaint  Patient presents with  . Shortness of Breath  . Back Pain   History of Present Illness:  The patient is a 58 year old male with multiple medical issues (see below) who initially presented to the [facility] emergency department approximately 1 week ago with a chief complaint of progressively worsening shortness of breath and back pain.  Patient endorses a history of progressively worsening shortness of breath, reports increasing lower extremity edema.  The patient tried using his Lasix without improvement.  Patient with known history of stage IV chronic kidney disease.  Patient is now requiring oxygen supplementation.  Denies fever, nausea vomiting.  Vascular surgery was consulted by nephrology, Dr. Candiss Norse for placement of a PermCath as they would like to initiate dialysis.  Current Facility-Administered Medications  Medication Dose Route Frequency Provider Last Rate Last Dose  . 0.9 %  sodium chloride infusion   Intravenous PRN Fritzi Mandes, MD   Stopped at 08/01/19 0106  . acetaminophen (TYLENOL) tablet 650 mg  650 mg Oral Q6H PRN Harrie Foreman, MD       Or  . acetaminophen (TYLENOL) suppository 650 mg  650 mg Rectal Q6H PRN Harrie Foreman, MD      . atorvastatin (LIPITOR) tablet 40 mg  40 mg Oral QHS Harrie Foreman, MD   40 mg at 08/02/19 2131  . carvedilol (COREG) tablet 25 mg  25 mg Oral BID WC Harrie Foreman, MD   25 mg at 08/03/19 0819  . docusate sodium (COLACE) capsule 100 mg  100 mg Oral BID Harrie Foreman, MD   100 mg at 08/03/19 0936  . epoetin alfa (EPOGEN) injection 10,000 Units  10,000 Units Subcutaneous Weekly Murlean Iba, MD   10,000 Units at 08/02/19 1145  . famotidine (PEPCID) tablet 20 mg  20 mg Oral QHS Harrie Foreman, MD   20 mg at 08/02/19  2131  . ferrous sulfate tablet 325 mg  325 mg Oral Daily Harrie Foreman, MD   325 mg at 08/03/19 0936  . gabapentin (NEURONTIN) capsule 100 mg  100 mg Oral TID Fritzi Mandes, MD   100 mg at 08/03/19 0936  . hydrALAZINE (APRESOLINE) tablet 25 mg  25 mg Oral BID Harrie Foreman, MD   25 mg at 08/03/19 0936  . HYDROmorphone (DILAUDID) injection 1 mg  1 mg Intravenous Q6H PRN Fritzi Mandes, MD      . insulin aspart (novoLOG) injection 0-5 Units  0-5 Units Subcutaneous QHS Harrie Foreman, MD   2 Units at 08/02/19 2131  . insulin aspart (novoLOG) injection 0-9 Units  0-9 Units Subcutaneous TID WC Harrie Foreman, MD   2 Units at 08/03/19 1230  . isosorbide mononitrate (IMDUR) 24 hr tablet 30 mg  30 mg Oral Daily Harrie Foreman, MD   30 mg at 08/03/19 0936  . lidocaine (LIDODERM) 5 % 1 patch  1 patch Transdermal Q24H Fritzi Mandes, MD   1 patch at 08/02/19 1431  . magnesium hydroxide (MILK OF MAGNESIA) suspension 30 mL  30 mL Oral QHS PRN Fritzi Mandes, MD   30 mL at 08/01/19 2131  . nitroGLYCERIN (NITROSTAT) SL tablet 0.4 mg  0.4 mg Sublingual Q5 min PRN Harrie Foreman, MD      . ondansetron [facility]) tablet 4 mg  4 mg Oral Q6H PRN Harrie Foreman, MD       Or  . ondansetron [facility]) injection 4 mg  4 mg Intravenous Q6H PRN Harrie Foreman, MD      . oxyCODONE (Oxy IR/ROXICODONE) immediate release tablet 10 mg  10 mg Oral Q6H PRN Fritzi Mandes, MD   10 mg at 08/03/19 1056  . polyethylene glycol (MIRALAX / GLYCOLAX) packet 17 g  17 g Oral Daily Harrie Foreman, MD   17 g at 08/03/19 0936  . sodium bicarbonate tablet 1,300 mg  1,300 mg Oral BID Harrie Foreman, MD   1,300 mg at 08/03/19 0936  . sodium chloride flush (NS) 0.9 % injection 10 mL  10 mL Intravenous Q12H Fritzi Mandes, MD   10 mL at 08/03/19 2458   Past Medical History:  Diagnosis Date  . CAD (coronary artery disease)    a. 04/2014 CABG x 4 (LIMA->LAD, VG->Diag, VG->OM, VG->PDA).  . Chronic systolic CHF (congestive  heart failure) ([place])    a. 07/2014 Echo: EF 30-35%.  . CKD (chronic kidney disease), stage III ([place])    a. 12/2015 Creat 1.8.  . Diabetic foot ulcers ([place])    a. left foot 2nd digit ant 5 th digit 05/03/14; b. 08/2014 s/p amputation of toes on L foot.  . Hypercholesterolemia    a. 12/2015 TC 116, TG 154, HDL 24, LDL 61-->Atorvastatin 40.  Marland Kitchen Hypertension   . Hypertensive heart disease   . Ischemic cardiomyopathy    a. 07/2013 Echo: EF 20%; b. 07/2014 Echo: EF 30-35%, diff HK, Gr1 DD, mildly dil LA, nl PASP.  Marland Kitchen Myocardial infarction ([place])   . Neuropathy in diabetes ([place])   . Open wound    foot  . Orthostatic hypotension   . Peripheral vascular disease ([place])   . Type II diabetes mellitus ([place])    a. 12/2015 HbA1c = 13.9.   Past Surgical History:  Procedure Laterality Date  . ACHILLES TENDON SURGERY Right 01/22/2017   Procedure: ACHILLES LENGTHENING/KIDNER/TAL/Teno achilles lengthening;  Surgeon: Samara Deist, DPM;  Location: [facility] ORS;  Service: Podiatry;  Laterality: Right;  . CARDIAC CATHETERIZATION     03/2014  . CATARACT EXTRACTION    . CORONARY ARTERY BYPASS GRAFT N/A 05/07/2014   Procedure: CORONARY ARTERY BYPASS GRAFTING (CABG);  Surgeon: Gaye Pollack, MD;  Location: [place];  Service: Open Heart Surgery;  Laterality: N/A;  Times 4 using left internal mammary artery and endoscopically harvested right saphenous vein  . INTRAOPERATIVE TRANSESOPHAGEAL ECHOCARDIOGRAM N/A 05/07/2014   Procedure: INTRAOPERATIVE TRANSESOPHAGEAL ECHOCARDIOGRAM;  Surgeon: Gaye Pollack, MD;  Location: [facility] OR;  Service: Open Heart Surgery;  Laterality: N/A;  . left foot osteomyelitis and wound after surgery    . TOE AMPUTATION     Left 3 and 4 toes  . TONSILECTOMY/ADENOIDECTOMY WITH MYRINGOTOMY     Social History Social History   Tobacco Use  . Smoking status: Never Smoker  . Smokeless tobacco: Never Used  Substance Use Topics  . Alcohol use: No  . Drug use: No   Family History Family History  Problem  Relation Age of Onset  . Heart disease Father        CABG in his 52's  . Diabetes type II Other   . Kidney disease Other   . Hypertension Other   . Ovarian cancer Mother   Denies family history of peripheral artery disease, venous disease and bleeding/clotting disorders.  Allergies  Allergen Reactions  . Other  Anaphylaxis and Other (See Comments)    Pt. And wife state that he had a reaction to "some" antibiotic but they do not know what the name of it was.They are are very apprehensive about him receiving any antibiotic. Zosyn, Clindamycin, & Vancomycin (all started together unsure which caused reaction 07/18/2013) 07/30/19- Allergic interstitial nephritis to Unasyn/zosyn in 2014 needing renal biopsy    REVIEW OF SYSTEMS (Negative unless checked)  Constitutional: [] Weight loss  [] Fever  [] Chills Cardiac: [] Chest pain   [] Chest pressure   [] Palpitations   [x] Shortness of breath when laying flat   [x] Shortness of breath at rest   [x] Shortness of breath with exertion. Vascular:  [] Pain in legs with walking   [] Pain in legs at rest   [] Pain in legs when laying flat   [] Claudication   [] Pain in feet when walking  [] Pain in feet at rest  [] Pain in feet when laying flat   [] History of DVT   [] Phlebitis   [x] Swelling in legs   [] Varicose veins   [] Non-healing ulcers Pulmonary:   [] Uses home oxygen   [] Productive cough   [] Hemoptysis   [] Wheeze  [] COPD   [] Asthma Neurologic:  [] Dizziness  [] Blackouts   [] Seizures   [] History of stroke   [] History of TIA  [] Aphasia   [] Temporary blindness   [] Dysphagia   [] Weakness or numbness in arms   [] Weakness or numbness in legs Musculoskeletal:  [] Arthritis   [] Joint swelling   [] Joint pain   [] Low back pain Hematologic:  [] Easy bruising  [] Easy bleeding   [] Hypercoagulable state   [] Anemic  [] Hepatitis Gastrointestinal:  [] Blood in stool   [] Vomiting blood  [] Gastroesophageal reflux/heartburn   [] Difficulty swallowing. Genitourinary:  [x] Chronic kidney disease    [] Difficult urination  [] Frequent urination  [] Burning with urination   [] Blood in urine Skin:  [] Rashes   [] Ulcers   [] Wounds Psychological:  [] History of anxiety   []  History of major depression.  Physical Examination  Vitals:   08/02/19 1949 08/03/19 0428 08/03/19 0515 08/03/19 0801  BP: 133/61 137/67  (!) 148/70  Pulse: 77 81  79  Resp:  20  19  Temp: 98.5 F (36.9 C) 99.5 F (37.5 C)    TempSrc: Oral Oral    SpO2: 93% (!) 89% 95% 92%  Weight:      Height:       Body mass index is 37.22 kg/m. Gen:  WD/WN, NAD Head: [place]/AT, No temporalis wasting. Prominent temp pulse not noted. Ear/Nose/Throat: Hearing grossly intact, nares w/o erythema or drainage, oropharynx w/o Erythema/Exudate Eyes: Sclera non-icteric, conjunctiva clear Neck: Trachea midline.  No JVD.  Pulmonary:  Good air movement, respirations not labored, equal bilaterally.  Cardiac: RRR, normal S1, S2. Vascular:  Vessel Right Left  Radial Palpable Palpable  Ulnar Palpable Palpable  Brachial Palpable Palpable  Carotid Palpable, without bruit Palpable, without bruit  Aorta Not palpable N/A  Femoral Palpable Palpable  Popliteal Palpable Palpable  PT Non-Palpable Non-Palpable  DP Non-Palpable Non-Palpable   Gastrointestinal: soft, non-tender/non-distended. No guarding/reflex.  Musculoskeletal: M/S 5/5 throughout.  Extremities without ischemic changes.  No deformity or atrophy. Mild-Moderate edema bilaterally. Neurologic: Sensation grossly intact in extremities.  Symmetrical.  Speech is fluent. Motor exam as listed above. Psychiatric: Judgment intact, Mood & affect appropriate for pt's clinical situation. Dermatologic: No rashes or ulcers noted.  No cellulitis or open wounds. Lymph : No Cervical, Axillary, or Inguinal lymphadenopathy.  CBC Lab Results  Component Value Date   WBC 12.4 (H) 07/29/2019   HGB  7.6 (L) 08/02/2019   HCT 22.0 (L) 07/29/2019   MCV 91.7 07/29/2019   PLT 259 07/29/2019   BMET     Component Value Date/Time   NA 133 (L) 08/03/2019 0445   NA 136 08/28/2014 0507   K 4.6 08/03/2019 0445   K 4.1 08/28/2014 0507   CL 100 08/03/2019 0445   CL 106 08/28/2014 0507   CO2 22 08/03/2019 0445   CO2 22 08/28/2014 0507   GLUCOSE 286 (H) 08/03/2019 0445   GLUCOSE 145 (H) 08/28/2014 0507   BUN 107 (H) 08/03/2019 0445   BUN 25 (H) 08/28/2014 0507   CREATININE 5.19 (H) 08/03/2019 0445   CREATININE 1.57 (H) 08/28/2014 0507   CALCIUM 7.6 (L) 08/03/2019 0445   CALCIUM 7.6 (L) 08/28/2014 0507   GFRNONAA 11 (L) 08/03/2019 0445   GFRNONAA 50 (L) 08/28/2014 0507   GFRNONAA 35 (L) 09/19/2013 1338   GFRAA 13 (L) 08/03/2019 0445   GFRAA >60 08/28/2014 0507   GFRAA 41 (L) 09/19/2013 1338   Estimated Creatinine Clearance: 22.3 mL/min (A) (by C-G formula based on SCr of 5.19 mg/dL (H)).  COAG Lab Results  Component Value Date   INR 1.36 05/07/2014   INR 1.05 05/03/2014   INR 1.1 08/07/2013   Radiology Ct Abdomen Pelvis Wo Contrast  Result Date: 07/11/2019 CLINICAL DATA:  Diarrhea EXAM: CT ABDOMEN AND PELVIS WITHOUT CONTRAST TECHNIQUE: Multidetector CT imaging of the abdomen and pelvis was performed following the standard protocol without IV contrast. COMPARISON:  02/16/2018 FINDINGS: Lower chest: Calcifications throughout the visualized right coronary artery. Heart is normal size. Aorta is normal caliber. Hepatobiliary: No mediastinal, hilar, or axillary adenopathy. Pancreas: No focal abnormality or ductal dilatation. Spleen: No focal abnormality.  Normal size. Adrenals/Urinary Tract: Bilateral perinephric stranding. No renal or adrenal mass. No hydronephrosis. Urinary bladder unremarkable. Stomach/Bowel: Normal appendix. Stomach, large and small bowel grossly unremarkable. Vascular/Lymphatic: Aortic atherosclerosis. No enlarged abdominal or pelvic lymph nodes. Reproductive: Mildly prominent prostate with central calcifications. Other: No free fluid or free air. Musculoskeletal: No  acute bony abnormality. IMPRESSION: No acute findings in the abdomen or pelvis. Aortic atherosclerosis, coronary artery disease. Electronically Signed   By: Rolm Baptise M.D.   On: 07/11/2019 18:19   Dg Chest 2 View  Result Date: 07/27/2019 CLINICAL DATA:  Shortness of breath EXAM: CHEST - 2 VIEW COMPARISON:  10/19/2018 FINDINGS: Cardiomegaly status post median sternotomy. Pulmonary vascular prominence without overt edema or other airspace opacity. The visualized skeletal structures are unremarkable. IMPRESSION: Cardiomegaly. Pulmonary vascular prominence without overt edema or other airspace opacity. Electronically Signed   By: Eddie Candle M.D.   On: 07/27/2019 17:48   Ct Lumbar Spine Wo Contrast  Result Date: 07/27/2019 CLINICAL DATA:  Back pain EXAM: CT LUMBAR SPINE WITHOUT CONTRAST TECHNIQUE: Multidetector CT imaging of the lumbar spine was performed without intravenous contrast administration. Multiplanar CT image reconstructions were also generated. COMPARISON:  July 11, 2019 FINDINGS: Segmentation: There are 5 non-rib bearing lumbar type vertebral bodies with the last intervertebral disc space labeled as L5-S1. Alignment: Normal Vertebrae: The vertebral body heights are well maintained. No fracture, malalignment, or pathologic osseous lesions seen. Paraspinal and other soft tissues: There is new soft tissue edema and focal heterogeneous soft tissue density seen overlying L4-L5 . Surrounding subcutaneous edema and skin thickening is seen within this region. The sacroiliac joints appear to be intact. Scattered aortic atherosclerosis is noted. Disc levels: Mild disc height loss and facet arthrosis most notable at L4-L5 and L5-S1. IMPRESSION:  There is new significant soft tissue edema and heterogeneous soft tissue collection seen at L4-L5 in the posterior paraspinal soft tissues which could represent early phlegmon versus soft tissue hematoma given the patient's history. If further evaluation is  required would recommend MRI with contrast. These results were called by telephone at the time of interpretation on 07/27/2019 at 11:29 pm to provider PHILLIP STAFFORD , who verbally acknowledged these results. Electronically Signed   By: Prudencio Pair M.D.   On: 07/27/2019 23:30   Mr Lumbar Spine Wo Contrast  Result Date: 07/28/2019 CLINICAL DATA:  58 year old male with back pain. Abnormal subcutaneous lumbar paraspinal soft tissues on CT yesterday. EXAM: MRI LUMBAR SPINE WITHOUT CONTRAST TECHNIQUE: Multiplanar, multisequence MR imaging of the lumbar spine was performed. No intravenous contrast was administered. COMPARISON:  Lumbar spine CT 07/27/2019. FINDINGS: The patient was scanned in the lateral decubitus position due to pain, and subsequently the sagittal images are somewhat oblique. Segmentation:  Normal on the CT yesterday. Alignment:  Straightening of lumbar lordosis.  No spondylolisthesis. Vertebrae: No marrow edema or evidence of acute osseous abnormality. Visualized bone marrow signal is within normal limits. Intact visible sacrum and SI joints. Conus medullaris and cauda equina: Conus extends to the L1 level. No lower spinal cord or conus signal abnormality. Paraspinal and other soft tissues: Negative visible abdominal viscera. Large body habitus with abundant lumbar posterior subcutaneous edema. Overlying the L4 level to the right of midline there is a 7 centimeter area of confluent abnormal signal in the subcutaneous fat (series 9, image 17 and series 13, image 28). With associated inflammation in the underlying superficial aspect of the right lumbar rectus spine I muscles from L2-L3 inferiorly (series 14, images 20 through 28). No contrast was administered but there is no drainable fluid identified. No underlying bony changes are evident. Disc levels: T12-L1:  Negative. L1-L2:  Mild facet hypertrophy. L2-L3:  Mild facet hypertrophy. L3-L4:  Mild far lateral disc bulging.  No definite stenosis.  L4-L5: Mild circumferential disc bulge. No spinal or lateral recess stenosis. Mild bilateral L4 foraminal stenosis probably greater on the left. L5-S1:  Negative disc.  Mild facet hypertrophy.  No stenosis. IMPRESSION: 1. Large round 7 centimeter nonspecific soft tissue lesion in the subcutaneous fat overlying the L4 vertebra to the right of midline. This could be an infectious phlegmon or a hematoma. There is underlying edema within the superficial aspect of the right erector spinae muscle from L2 to the sacrum, but no drainable fluid collection. There is abundant generalized subcutaneous edema throughout the lumbar spine. 2. No underlying acute osseous abnormality in the spine. 3. Relatively mild for age lumbar degeneration with no spinal stenosis and only mild bilateral L4 foraminal stenosis. Electronically Signed   By: Genevie Ann M.D.   On: 07/28/2019 16:21   [place] Fna Soft Tissue  Result Date: 07/31/2019 INDICATION: 58 year old male with spontaneous lumbar spine pain and evidence of cutaneous cellulitis with underlying edema/phlegmon/hematoma/abscess. Ultrasound evaluation demonstrates a very small amount of fluid. He presents for aspiration for culture. EXAM: US BIOPSY FNA W/ IMAGING MEDICATIONS: The patient is currently admitted to the hospital and receiving intravenous antibiotics. The antibiotics were administered within an appropriate time frame prior to the initiation of the procedure. ANESTHESIA/SEDATION: None COMPLICATIONS: None immediate. PROCEDURE: Informed written consent was obtained from the patient after a thorough discussion of the procedural risks, benefits and alternatives. All questions were addressed. A timeout was performed prior to the initiation of the procedure. Ultrasound was used to interrogate  the region of clinical concern. There is a tiny crescent of fluid within the subcutaneous fat. Following sterile prep and drape in the standard fashion with chlorhexidine, local anesthesia was  attained by infiltration with 1% lidocaine. A small dermatotomy was made. An 18 gauge trocar needle was then advanced into the fluid collection. Aspiration and agitation were performed yielding a small amount (1-2 mL) of serosanguineous fluid. No obvious purulence. IMPRESSION: Trace serosanguineous fluid successfully aspirated from the region of clinical concern. The specimen was sent for culture. No frank purulence encountered. Electronically Signed   By: Jacqulynn Cadet M.D.   On: 07/31/2019 13:01   Assessment/Plan The patient is a 58 year old male with multiple medical issues including known chronic kidney disease which is now progressed requiring dialysis. 1.  Chronic kidney disease: Patient with known stage IV chronic kidney disease which is now progressed to requiring dialysis.  At this time, the patient does not have an adequate dialysis access.  Nephrology is asking for PermCath placement to the patient may start to dialyze as an inpatient continue on the outpatient basis.  We will place a PermCath tomorrow with Dr. Delana Meyer procedure, risks and benefits explained to the patient and his wife was at the bedside.  All questions answered.  The patient wishes to proceed. 2.  Diabetes: On appropriate medications. Encouraged good control as its slows the progression of atherosclerotic disease 3.  Hyperlipidemia: On appropriate medications. Encouraged good control as its slows the progression of atherosclerotic disease.  Discussed with Dr. Francene Castle, PA-C  08/03/2019 1:44 PM  This note was created with Dragon medical transcription system.  Any error is purely unintentional

## 2019-08-03 NOTE — Progress Notes (Signed)
Progress Note  Patient Name: [name] [name] Date of Encounter: 08/03/2019  Primary Cardiologist: Ida Rogue, MD  Subjective   No chest pain, dyspnea, or complaints of back pain.  Continues to have abdominal bloating.  His wife again points out that he has seemed confused but is appropriate.  Inpatient Medications    Scheduled Meds: . atorvastatin  40 mg Oral QHS  . carvedilol  25 mg Oral BID WC  . docusate sodium  100 mg Oral BID  . epoetin (EPOGEN/PROCRIT) injection  10,000 Units Subcutaneous Weekly  . famotidine  20 mg Oral QHS  . ferrous sulfate  325 mg Oral Daily  . gabapentin  100 mg Oral TID  . hydrALAZINE  25 mg Oral BID  . insulin aspart  0-5 Units Subcutaneous QHS  . insulin aspart  0-9 Units Subcutaneous TID WC  . isosorbide mononitrate  30 mg Oral Daily  . lidocaine  1 patch Transdermal Q24H  . polyethylene glycol  17 g Oral Daily  . sodium bicarbonate  1,300 mg Oral BID  . sodium chloride flush  10 mL Intravenous Q12H   Continuous Infusions: . sodium chloride Stopped (08/01/19 0106)   PRN Meds: sodium chloride, acetaminophen **OR** acetaminophen, HYDROmorphone (DILAUDID) injection, magnesium hydroxide, nitroGLYCERIN, ondansetron **OR** ondansetron (ZOFRAN) IV, oxyCODONE   Vital Signs    Vitals:   08/02/19 1949 08/03/19 0428 08/03/19 0515 08/03/19 0801  BP: 133/61 137/67  (!) 148/70  Pulse: 77 81  79  Resp:  20  19  Temp: 98.5 F (36.9 C) 99.5 F (37.5 C)    TempSrc: Oral Oral    SpO2: 93% (!) 89% 95% 92%  Weight:      Height:        Intake/Output Summary (Last 24 hours) at 08/03/2019 1310 Last data filed at 08/03/2019 1029 Gross per 24 hour  Intake 240 ml  Output 50 ml  Net 190 ml   Filed Weights   07/31/19 0330 08/01/19 0417 08/02/19 0502  Weight: 125.6 kg 126.8 kg 128 kg    Physical Exam   GEN: Obese, in no acute distress.  HEENT: Grossly normal.  Neck: Supple, no JVD, carotid bruits, or masses. Cardiac: RRR, distant heart  sounds, no murmurs rubs, or gallops. No clubbing, cyanosis, 1+ bilateral lower extremity edema.  Radials/DP/PT 2+ and equal bilaterally.  Respiratory:  Respirations regular and unlabored, diminished breath sounds bilaterally. GI: Obese, protuberant, semi-firm, nontender, BS + x 4. MS: no deformity or atrophy. Skin: warm and dry, no rash. Neuro:  Strength and sensation are intact. Psych: AAOx3.  Normal affect.  Labs    Chemistry Recent Labs  Lab 08/01/19 1026 08/02/19 0443 08/03/19 0445  NA 134* 134* 133*  K 4.1 4.4 4.6  CL 100 99 100  CO2 24 24 22   GLUCOSE 197* 164* 286*  BUN 82* 91* 107*  CREATININE 4.59* 4.92* 5.19*  CALCIUM 8.1* 7.7* 7.6*  ALBUMIN 2.1* 1.9* 1.9*  GFRNONAA 13* 12* 11*  GFRAA 15* 14* 13*  ANIONGAP 10 11 11      Hematology Recent Labs  Lab 07/27/19 1711 07/29/19 0520 07/30/19 0634 08/02/19 0443  WBC 14.5* 12.4*  --   --   RBC 2.71* 2.40*  --   --   HGB 8.0* 7.0* 7.7* 7.6*  HCT 25.0* 22.0*  --   --   MCV 92.3 91.7  --   --   MCH 29.5 29.2  --   --   MCHC 32.0 31.8  --   --  RDW 13.1 13.2  --   --   PLT 309 259  --   --     Cardiac Enzymes  Recent Labs  Lab 07/27/19 1711 07/27/19 1909 07/28/19 0303 07/28/19 0547  TROPONINIHS 32* 55* 35* 31*      Radiology    No results found.  Telemetry    Non-telemetry  Cardiac Studies   2D Echocardiogram 8.13.2020  IMPRESSIONS   1. The left ventricle has moderate-severely reduced systolic function, with an ejection fraction of 30-35%. The cavity size was normal. There is mildly increased left ventricular wall thickness. Left ventricular diastolic Doppler parameters are  consistent with pseudonormalization. Left ventricular diffuse hypokinesis. 2. The right ventricle has normal systolic function. The cavity was normal. There is no increase in right ventricular wall thickness. Unable to estimate RVSP. 3. Left atrial size was moderately dilated. _____________   Elwyn Reach 10/2018: Normal  sinus rhythm avg HR of 81 bpm.  8 Ventricular Tachycardia runs occurred, the run with the fastest interval lasting 4 beats with a max rate of 200 bpm,  the longest lasting 17 beats with an avg rate of 120 bpm.   7 Supraventricular Tachycardia runs occurred,  the run with the fastest interval lasting 5 beats with a max rate of 148 bpm,  the longest lasting 13 beats with an avg rate of 110 bpm.   Isolated SVEs were rare (<1.0%), SVE Couplets were rare (<1.0%), and SVE Triplets were rare (<1.0%).  Isolated VEs were rare (<1.0%), VE Couplets were rare (<1.0%), and no VE Triplets were present. Ventricular Trigeminy was present.  Patient Profile     58 y.o. male with a history of CAD status post four-vessel bypass in June 2015, HFrEF/ischemic cardiomyopathy with an EF of 30 to 35%, nonsustained VT, paroxysmal SVT, PVD, diabetes mellitus, peripheral neuropathy, osteomyelitis, stage IV chronic kidney disease, hypertension, hyperlipidemia, orthostatic hypotension, and noncompliance, who was admitted September 10, with severe low back pain and dyspnea with volume overload.  Assessment & Plan    1.  Low back pain/soft tissue hematoma: Denies back pain this morning.  No longer on antibiotics.  Physical therapy recommended.  2.  Chronic combined systolic diastolic ingestive heart failure/ischemic cardiomyopathy: Weight continues to rise in the setting of worsening renal failure and holding diuretics.  He continues to note increasing abdominal girth and is also noted to have mild lower extremity edema.  Seen by nephrology this morning with plan for vascular surgical eval for permacath and initiation of hemodialysis tomorrow.  He remains on beta-blocker, hydralazine, and nitrate.  No ACE/ARB/ARNI/MRA in the setting of renal failure.  3.  Coronary artery disease: Status post prior four-vessel bypass with low to intermediate risk stress testing in April 2019.  No chest pain.  High-sensitivity troponin was  mildly elevated to a peak of 55 though trend overall was flat and more reflective of demand ischemia in the setting of CHF and worsening renal failure.  Continue statin, beta-blocker, and nitrate.  Aspirin on hold in the setting of soft tissue hematoma and ongoing anemia.  4.  Acute on chronic stage IV kidney disease: Diuretics on hold and despite this, BUN and creatinine continue to rise.  Also with some confusion-?  Uremia.  As above, nephrology following closely with plan to initiate dialysis tomorrow.  5.  Essential hypertension: Relatively stable.  6.  Hyperlipidemia: Remains on statin therapy.  Will need outpatient follow-up.  7.  Normocytic anemia: Status post 1 unit of blood earlier this admission.  Hemoglobin  has remained low but stable at 7.6 yesterday.  8.  Type 2 diabetes mellitus: A1c 7.2.  Sliding scale insulin per internal medicine.  Signed, Murray Hodgkins, NP  08/03/2019, 1:10 PM    For questions or updates, please contact   Please consult [email or web address] for contact info under Cardiology/STEMI.

## 2019-08-03 NOTE — Evaluation (Signed)
Occupational Therapy Evaluation Patient Details Name: [name] [name] MRN: [id number] DOB: [birth year] Today's Date: 08/03/2019    History of Present Illness 58 yo male with onset of SOB and ventricular tachycardia was admitted, has been given diuretic for CHF and pulm edema.  Pt has been having hypoxia, drops in BP and has become anemic needing transfusion.  Also having back pain with possible infection, has demand ischemia.  PMHx:  lumbar DJD, L4 foraminal stenosis, cardiomegaly, L foot toe amputations, TEE, PVD, DM, CABG, CHF, CKD, HTN, MI, PN.   Clinical Impression   Pt seen for OT evaluation this date. RN cleared pt for participation. Prior to hospital admission, pt was independent, enjoys golfing and swimming. Pt lives with his spouse, 2 cats, and 2 dogs.  Currently pt demonstrates impairments in activity tolerance, balance, and back pain with LB ADL attempts requiring close SBA to CGA for STS ADL tasks and CGA for functional mobility using a RW. Pt instructed in falls prevention strategies, positional considerations for objects/environment as well as pt's body positioning for ADL to minimize back pain/strain, pet care considerations, and use of BSC over toilet to incr height. Pt educated in use of AE for LB ADL to minimize bending. Pt verbalized understanding. Pt would benefit from skilled OT to address noted impairments and functional limitations (see below for any additional details) in order to maximize safety and independence while minimizing falls risk and caregiver burden.  Upon hospital discharge, recommend pt discharge to home with family assist as needed.    Follow Up Recommendations  No OT follow up    Equipment Recommendations  3 in 1 bedside commode;Other (comment)(reacher)    Recommendations for Other Services       Precautions / Restrictions Precautions Precautions: Fall Precaution Comments: monitor BP and O2 sats Restrictions Weight Bearing Restrictions: No       Mobility Bed Mobility Overal bed mobility: Modified Independent Bed Mobility: Supine to Sit;Sit to Supine     Supine to sit: Modified independent (Device/Increase time) Sit to supine: Modified independent (Device/Increase time)      Transfers                      Balance Overall balance assessment: Needs assistance;History of Falls Sitting-balance support: No upper extremity supported;Feet supported Sitting balance-Leahy Scale: Fair Sitting balance - Comments: fair dynamic sitting balance when performing donning/doffing of socks requiring close SBA                                   ADL either performed or assessed with clinical judgement   ADL Overall ADL's : Needs assistance/impaired                                       General ADL Comments: CGA for seated LB dressing, CGA for functional toilet transfers     Vision Baseline Vision/History: Wears glasses Wears Glasses: Reading only Patient Visual Report: No change from baseline       Perception     Praxis      Pertinent Vitals/Pain Pain Assessment: 0-10 Pain Score: 6  Pain Location: low back while performing LB dressing Pain Descriptors / Indicators: Sore;Aching Pain Intervention(s): Limited activity within patient's tolerance;Monitored during session;Repositioned     Hand Dominance Right   Extremity/Trunk Assessment Upper Extremity Assessment Upper Extremity  Assessment: Overall WFL for tasks assessed   Lower Extremity Assessment Lower Extremity Assessment: Overall WFL for tasks assessed       Communication Communication Communication: No difficulties   Cognition Arousal/Alertness: Awake/alert Behavior During Therapy: WFL for tasks assessed/performed(appeared a bit "groggy"/tired) Overall Cognitive Status: Within Functional Limits for tasks assessed                                     General Comments       Exercises Other Exercises Other  Exercises: Pt instructed in falls prevention strategies, positional considerations for objects/environment as well as pt's body positioning for ADL to minimize back pain/strain, pet care considerations, and use of BSC over toilet to incr height   Shoulder Instructions      Home Living Family/patient expects to be discharged to:: Private residence Living Arrangements: Spouse/significant other Available Help at Discharge: Family;Friend(s);Available PRN/intermittently Type of Home: House Home Access: Stairs to enter [employer] of Steps: 4 or 5 Entrance Stairs-Rails: Right;Left(single rail can be reached only on either set) Home Layout: One level     Bathroom Shower/Tub: Teacher, early years/pre: Standard     Home Equipment: Environmental consultant - 2 wheels;Cane - single point;Grab bars - tub/shower          Prior Functioning/Environment Level of Independence: Independent with assistive device(s)        Comments: able to drive and out in the community, independent        OT Problem List: Pain;Decreased activity tolerance;Impaired balance (sitting and/or standing);Decreased knowledge of use of DME or AE      OT Treatment/Interventions: Self-care/ADL training;Therapeutic exercise;Therapeutic activities;DME and/or AE instruction;Patient/family education;Balance training;Energy conservation    OT Goals(Current goals can be found in the care plan section) Acute Rehab OT Goals Patient Stated Goal: go home OT Goal Formulation: With patient Time For Goal Achievement: 08/17/19 Potential to Achieve Goals: Good ADL Goals Pt Will Perform Lower Body Dressing: with modified independence;sit to/from stand Pt Will Transfer to Toilet: with modified independence;ambulating(LRAD for amb, BSC over toilet) Additional ADL Goal #1: Pt will verbalize plan to implement at least 1 learned home/routines modification to minimize back pain/strain during ADL at home.  OT Frequency: Min 1X/week    Barriers to D/C:            Co-evaluation              AM-PAC OT "6 Clicks" Daily Activity     Outcome Measure Help from another person eating meals?: None Help from another person taking care of personal grooming?: None Help from another person toileting, which includes using toliet, bedpan, or urinal?: A Little Help from another person bathing (including washing, rinsing, drying)?: A Little Help from another person to put on and taking off regular upper body clothing?: None Help from another person to put on and taking off regular lower body clothing?: A Little 6 Click Score: 21   End of Session Nurse Communication: (RN cleared to participate)  Activity Tolerance: Patient tolerated treatment well Patient left: in bed;with call bell/phone within reach  OT Visit Diagnosis: Other abnormalities of gait and mobility (R26.89);Pain Pain - part of body: (low back)                Time: 0945-1000 OT Time Calculation (min): 15 min Charges:  OT General Charges $OT Visit: 1 Visit OT Evaluation $OT Eval Low Complexity: 1 Low  OT Treatments $Self Care/Home Management : 8-22 mins  Jeni Salles, MPH, MS, OTR/L ascom [phone] 08/03/19, 10:18 AM

## 2019-08-03 NOTE — Progress Notes (Signed)
Inpatient Diabetes Program Recommendations  AACE/ADA: New Consensus Statement on Inpatient Glycemic Control  Target Ranges:  Prepandial:   less than 140 mg/dL      Peak postprandial:   less than 180 mg/dL (1-2 hours)      Critically ill patients:  140 - 180 mg/dL   Results for [name] [name], [name] [name] (MRN [id number]) as of 08/03/2019 15:09  Ref. Range 08/02/2019 08:27 08/02/2019 11:56 08/02/2019 16:31 08/02/2019 20:56 08/03/2019 07:59 08/03/2019 11:38  Glucose-Capillary Latest Ref Range: 70 - 99 mg/dL 163 (H) 185 (H) 192 (H) 242 (H) 226 (H) 226 (H)   Review of Glycemic Control  Diabetes history: DM2 Outpatient Diabetes medications: Amaryl 4 mg BID Current orders for Inpatient glycemic control: Novolog 0-9 units TID with meals, Novolog 0-5 units QHS  Inpatient Diabetes Program Recommendations:   Insulin-Meal Coverage: Please consider ordering Novolog 2 units TID with meals for meal coverage if patient eats at least 50% of meals.  Thanks, Barnie Alderman, RN, MSN, CDE Diabetes Coordinator Inpatient Diabetes Program [phone] (Team Pager from 8am to 5pm)

## 2019-08-03 NOTE — Progress Notes (Signed)
Physical Therapy Treatment Patient Details Name: [name] [name] MRN: [id number] DOB: Mar 26, 1961 Today's Date: 08/03/2019    History of Present Illness 58 yo male with onset of SOB and ventricular tachycardia was admitted, has been given diuretic for CHF and pulm edema.  Pt has been having hypoxia, drops in BP and has become anemic needing transfusion.  Also having back pain with possible infection, has demand ischemia.  PMHx:  lumbar DJD, L4 foraminal stenosis, cardiomegaly, L foot toe amputations, TEE, PVD, DM, CABG, CHF, CKD, HTN, MI, PN.    PT Comments    Pt resting in bed upon PT arrival and pt's wife present reporting pt had been sleeping a lot and pt's wife also verbalizing her concerns regarding pt's current medical status and implications for pt's participation in physical therapy.  Pt and pt's wife agreeable to pt sitting on edge of bed.  After sitting edge of bed pt appearing tired (occasionally closing his eyes; BP 139/57) and then pt reporting increasing low back pain and requesting to lay down in bed.  After pt layed down in bed, pt reported 6-8/10 low back pain and declined any further physical therapy (including ex's in bed); nurse came to give pt pain medication.  Will continue to progress pt per pt tolerance and will monitor for any potential changes to physical therapy follow up/discharge recommendations.   Follow Up Recommendations  Home health PT;Supervision for mobility/OOB     Equipment Recommendations  Rolling walker with 5" wheels;3in1 (PT)    Recommendations for Other Services OT consult     Precautions / Restrictions Precautions Precautions: Fall Precaution Comments: monitor BP and O2 sats; h/o orthostatic hypotension Restrictions Weight Bearing Restrictions: No    Mobility  Bed Mobility Overal bed mobility: Needs Assistance Bed Mobility: Supine to Sit;Sit to Supine Rolling: Supervision Sidelying to sit: Supervision;HOB elevated Sit to supine:  Supervision;HOB elevated   General bed mobility comments: vc's for logrolling technique; mild increased effort to perform on own  Transfers                 General transfer comment: Deferred d/t low back pain and pt requesting to lay back down  Ambulation/Gait                 Stairs             Wheelchair Mobility    Modified Rankin (Stroke Patients Only)       Balance Overall balance assessment: Needs assistance Sitting-balance support: No upper extremity supported;Feet supported Sitting balance-Leahy Scale: Good Sitting balance - Comments: steady sitting reaching within BOS                                    Cognition Arousal/Alertness: (pt resting/sleeping upon PT arrival but woke with vc's) Behavior During Therapy: (appeared tired) Overall Cognitive Status: Within Functional Limits for tasks assessed                                        Exercises     General Comments   Nursing cleared pt for participation in physical therapy.  Pt agreeable to PT session.      Pertinent Vitals/Pain Pain Assessment: 0-10 Pain Score: 8  Pain Location: low back Pain Descriptors / Indicators: Sore;Aching Pain Intervention(s): Limited activity within patient's tolerance;Monitored during session;Repositioned;Patient  requesting pain meds-RN notified;RN gave pain meds during session  Vitals stable and WFL throughout treatment session.    Home Living       Prior Function        PT Goals (current goals can now be found in the care plan section) Acute Rehab PT Goals Patient Stated Goal: go home PT Goal Formulation: With patient Time For Goal Achievement: 08/12/19 Potential to Achieve Goals: Fair Progress towards PT goals: Not progressing toward goals - comment(limited d/t low back pain)    Frequency    Min 2X/week      PT Plan Current plan remains appropriate    Co-evaluation              AM-PAC PT "6  Clicks" Mobility   Outcome Measure  Help needed turning from your back to your side while in a flat bed without using bedrails?: None Help needed moving from lying on your back to sitting on the side of a flat bed without using bedrails?: None Help needed moving to and from a bed to a chair (including a wheelchair)?: A Little Help needed standing up from a chair using your arms (e.g., wheelchair or bedside chair)?: A Little Help needed to walk in hospital room?: A Little Help needed climbing 3-5 steps with a railing? : A Little 6 Click Score: 20    End of Session Equipment Utilized During Treatment: Gait belt Activity Tolerance: Patient limited by pain Patient left: in bed;with call bell/phone within reach;with bed alarm set;with family/visitor present Nurse Communication: Mobility status;Patient requests pain meds;Precautions PT Visit Diagnosis: Other abnormalities of gait and mobility (R26.89);Unsteadiness on feet (R26.81);Ataxic gait (R26.0);Difficulty in walking, not elsewhere classified (R26.2);Dizziness and giddiness (R42)     Time: 3419-3790 PT Time Calculation (min) (ACUTE ONLY): 24 min  Charges:  $Therapeutic Activity: 23-37 mins                     Leitha Bleak, PT 08/03/19, 1:24 PM [phone]

## 2019-08-03 NOTE — Progress Notes (Signed)
[place] [place] NAME: [name] [name]    MR#:  [id number]  DATE OF BIRTH:  03-23-1961  SUBJECTIVE:   Denies any complaints other than low back pain. Per wife patient has been sleeping yesterday since yesterday a lot. Pain meds have been adjusted. REVIEW OF SYSTEMS:   Review of Systems  Constitutional: Negative for chills, fever and weight loss.  HENT: Negative for ear discharge, ear pain and nosebleeds.   Eyes: Negative for blurred vision, pain and discharge.  Respiratory: Positive for shortness of breath. Negative for sputum production, wheezing and stridor.   Cardiovascular: Positive for leg swelling. Negative for chest pain, palpitations, orthopnea and PND.  Gastrointestinal: Negative for abdominal pain, diarrhea, nausea and vomiting.  Genitourinary: Negative for frequency and urgency.  Musculoskeletal: Positive for back pain. Negative for joint pain.  Neurological: Positive for weakness. Negative for sensory change, speech change and focal weakness.  Psychiatric/Behavioral: Negative for depression and hallucinations. The patient is not nervous/anxious.    Tolerating Diet:yes Tolerating PT: home health  DRUG ALLERGIES:   Allergies  Allergen Reactions  . Other Anaphylaxis and Other (See Comments)    Pt. And wife state that he had a reaction to "some" antibiotic but they do not know what the name of it was.They are are very apprehensive about him receiving any antibiotic. Zosyn, Clindamycin, & Vancomycin (all started together unsure which caused reaction 07/18/2013) 07/30/19- Allergic interstitial nephritis to Unasyn/zosyn in 2014 needing renal biopsy     VITALS:  Blood pressure (!) 148/70, pulse 79, temperature 99.5 F (37.5 C), temperature source Oral, resp. rate 19, height 6\' 1"  (1.854 m), weight 128 kg, SpO2 92 %.  PHYSICAL EXAMINATION:   Physical Exam  GENERAL:  58 y.o.-year-old patient lying in the bed with no  acute distress. He is obese, chronically ill EYES: Pupils equal, round, reactive to light and accommodation. No scleral icterus. Extraocular muscles intact.  HEENT: Head atraumatic, normocephalic. Oropharynx and nasopharynx clear.  NECK:  Supple, no jugular venous distention. No thyroid enlargement, no tenderness.  LUNGS: decreased breath sounds bilaterally, no wheezing, rales, rhonchi. No use of accessory muscles of respiration.  CARDIOVASCULAR: S1, S2 normal. No murmurs, rubs, or gallops.  ABDOMEN: Soft, nontender, nondistended. Bowel sounds present. No organomegaly or mass.  EXTREMITIES: No cyanosis, clubbing  + edema b/l.    NEUROLOGIC: Cranial nerves II through XII are intact. No focal Motor or sensory deficits b/l. Some tenderness in the lower back. PSYCHIATRIC:  patient is alert and oriented x 3.  SKIN: mild redness in the lumbar area marked  LABORATORY PANEL:  CBC Recent Labs  Lab 07/29/19 0520  08/02/19 0443  WBC 12.4*  --   --   HGB 7.0*   < > 7.6*  HCT 22.0*  --   --   PLT 259  --   --    < > = values in this interval not displayed.    Chemistries  Recent Labs  Lab 07/31/19 0524  08/03/19 0445  NA 133*   < > 133*  K 4.9  4.8   < > 4.6  CL 100   < > 100  CO2 23   < > 22  GLUCOSE 140*   < > 286*  BUN 74*   < > 107*  CREATININE 4.43*   < > 5.19*  CALCIUM 7.8*   < > 7.6*  MG 2.1  --   --    < > =  values in this interval not displayed.   Cardiac Enzymes No results for input(s): TROPONINI in the last 168 hours. RADIOLOGY:  No results found. ASSESSMENT AND PLAN:  [name] Umanzor Murphyis a 58 y.o.malewith a hx of CAD s/p four-vessel CABG (04/2014), HFrEF secondary to ICM, NSVT, paroxysmal SVT, PVD, poorly controlled insulin-dependent diabetes with diabetic foot ulcers and diabetic peripheral neuropathyrequiring amputations of the left toes, osteomyelitis, CKD stage IV, hypertension, hyperlipidemia  1. Acute on chronic systolic dysfunction with history of ischemic  cardiomyopathy -was on IV Lasix 40 mg BID--discontinued ---stop lasix due to rising creat --UOP 9.2 liters. Clinically looks better. sats 95% on RA -monitor input output, creatinine 3.45--4.43--4.61--4.92--5.1 -appreciate cardiology and nephrology input -low sodium diet -patient has history of coronary artery disease status post CABG  2. Low back pain acute --MRI of the lumbar spine shows Large round 7 centimeter nonspecific soft tissue area  in the subcutaneous fat overlying the L4 vertebra to the right of midline.-This could be an infectious phlegmon or a hematoma.--more favoring hemtoam per neurosx - ID consulted.-- was on IV cefazolin-- discontinued --okay to start physical therapy per neurosurgery -status post ultrasound guided drain of bloody fluid by IR on September 14th-- fluid culture negative  3. Acute on chronic kidney disease stage IV -monitor input output and creatinine -avoid nephrotoxic ends -continue PO bicarb -hold lasix -creat rising 4.6--4.92--5.1 -Dr. Candiss [name] discussed with patient and wife. They're agreeable starting dialysis. Vascular consultation placed.  4. Acute on chronic anemia secondary to kidney disease, questionable hematoma in the back -will transfuse one unit today -came in with hemoglobin of 9.2--- 7.0-- one unit blood transfusion---7.7--7.6 -SCD -hold aspirin  5. Type II diabetes sliding scale insulin  6. Hyperlipidemia continue statins  Spoke with Mrs. [name] [name] and updated today.  Management plans discussed with the patient, family and they are in agreement.  CODE STATUS: full  DVT Prophylaxis: SCD  TOTAL TIME TAKING CARE OF THIS PATIENT: *30* minutes.  >50% time spent on counselling and coordination of care  POSSIBLE D/C IN *?* DAYS, DEPENDING ON CLINICAL CONDITION.  Note: This dictation was prepared with Dragon dictation along with smaller phrase technology. Any transcriptional errors that result from this process are  unintentional.  [name] [name] M.D on 08/03/2019 at 12:17 PM  Between 7am to 6pm - Pager - [phone]  After 6pm go to [email or web address] - password [email or web address] [facility]  Office  [phone]  CC: Primary care physician; [name] [name], MDPatient ID: [name] [name], male   DOB: [birth year], 58 y.o.   MRN: [id number]

## 2019-08-03 NOTE — Care Management Important Message (Signed)
Important Message  Patient Details  Name: [name] [name] MRN: [id number] Date of Birth: [birth year]   Medicare Important Message Given:  Yes     Dannette Barbara 08/03/2019, 1:24 PM

## 2019-08-03 NOTE — Progress Notes (Signed)
[facility], [place] 08/03/19  Subjective:   LOS: 6 09/16 0701 - 09/17 0700 In: 240 [P.O.:240] Out: 600 [Urine:600]  Patient's wife is at bedside Reports some confusion this morning. In and out of confusion.   narcotics have been minimized Working with PT Continues to have LE edema Appetite is fair Sleeping a lot No SOB, requiring O2 supplementation BUN/Cr higher   Objective:  Vital signs in last 24 hours:  Temp:  [98 F (36.7 C)-99.5 F (37.5 C)] 99.5 F (37.5 C) (09/17 0428) Pulse Rate:  [77-83] 79 (09/17 0801) Resp:  [18-20] 19 (09/17 0801) BP: (110-148)/(61-71) 148/70 (09/17 0801) SpO2:  [89 %-95 %] 92 % (09/17 0801)  Weight change:  Filed Weights   07/31/19 0330 08/01/19 0417 08/02/19 0502  Weight: 125.6 kg 126.8 kg 128 kg    Intake/Output:    Intake/Output Summary (Last 24 hours) at 08/03/2019 1125 Last data filed at 08/03/2019 1029 Gross per 24 hour  Intake 240 ml  Output 50 ml  Net 190 ml   Physical Exam: Gen:   Alert, cooperative, no distress, appears stated age Head:   Normocephalic, without obvious abnormality, atraumatic Eyes/ENT:  conjunctiva clear,  moist oral mucus membranes Neck:  Supple,   Lungs:   Clear to auscultation bilaterally, respirations unlabored Heart:   No rub  Abdomen:   Soft, non-tender  Extremities: no cyanosis , 1+ b/l edema in thighs and lower abdomen Skin:  Skin color, texture, turgor normal, no rashes or lesions Neurologic: Alert, some confusion with specific questions    Basic Metabolic Panel:  Recent Labs  Lab 07/28/19 0303  07/29/19 0520 07/31/19 0524 08/01/19 1026 08/02/19 0443 08/03/19 0445  NA  --   --  136 133* 134* 134* 133*  K  --   --  4.5 4.9  4.8 4.1 4.4 4.6  CL  --   --  106 100 100 99 100  CO2  --   --  22 23 24 24 22   GLUCOSE  --   --  153* 140* 197* 164* 286*  BUN  --   --  66* 74* 82* 91* 107*  CREATININE  --    < > 3.45* 4.43* 4.59* 4.92* 5.19*  CALCIUM  --    --  7.8* 7.8* 8.1* 7.7* 7.6*  MG 2.3  --   --  2.1  --   --   --   PHOS  --   --   --  6.2* 6.6* 6.8* 5.7*   < > = values in this interval not displayed.     CBC: Recent Labs  Lab 07/27/19 1711 07/29/19 0520 07/30/19 0634 08/02/19 0443  WBC 14.5* 12.4*  --   --   HGB 8.0* 7.0* 7.7* 7.6*  HCT 25.0* 22.0*  --   --   MCV 92.3 91.7  --   --   PLT 309 259  --   --      No results found for: HEPBSAG, HEPBSAB, HEPBIGM    Microbiology:  Recent Results (from the past 240 hour(s))  SARS Coronavirus 2 [facility] order, Performed in [facility] hospital lab) Nasopharyngeal Nasopharyngeal Swab     Status: None   Collection Time: 07/27/19 10:36 PM   Specimen: Nasopharyngeal Swab  Result Value Ref Range Status   SARS Coronavirus 2 NEGATIVE NEGATIVE Final    Comment: (NOTE) If result is NEGATIVE SARS-CoV-2 target nucleic acids are NOT DETECTED. The SARS-CoV-2 RNA is generally detectable in upper  and lower  respiratory specimens during the acute phase of infection. The lowest  concentration of SARS-CoV-2 viral copies this assay can detect is 250  copies / mL. A negative result does not preclude SARS-CoV-2 infection  and should not be used as the sole basis for treatment or other  patient management decisions.  A negative result may occur with  improper specimen collection / handling, submission of specimen other  than nasopharyngeal swab, presence of viral mutation(s) within the  areas targeted by this assay, and inadequate number of viral copies  (<250 copies / mL). A negative result must be combined with clinical  observations, patient history, and epidemiological information. If result is POSITIVE SARS-CoV-2 target nucleic acids are DETECTED. The SARS-CoV-2 RNA is generally detectable in upper and lower  respiratory specimens dur ing the acute phase of infection.  Positive  results are indicative of active infection with SARS-CoV-2.  Clinical  correlation with patient history  and other diagnostic information is  necessary to determine patient infection status.  Positive results do  not rule out bacterial infection or co-infection with other viruses. If result is PRESUMPTIVE POSTIVE SARS-CoV-2 nucleic acids MAY BE PRESENT.   A presumptive positive result was obtained on the submitted specimen  and confirmed on repeat testing.  While 2019 novel coronavirus  (SARS-CoV-2) nucleic acids may be present in the submitted sample  additional confirmatory testing may be necessary for epidemiological  and / or clinical management purposes  to differentiate between  SARS-CoV-2 and other Sarbecovirus currently known to infect humans.  If clinically indicated additional testing with an alternate test  methodology [phone]) is advised. The SARS-CoV-2 RNA is generally  detectable in upper and lower respiratory sp ecimens during the acute  phase of infection. The expected result is Negative. Fact Sheet for Patients:  StrictlyIdeas.no Fact Sheet for Healthcare Providers: BankingDealers.co.za This test is not yet approved or cleared by the [place] FDA and has been authorized for detection and/or diagnosis of SARS-CoV-2 by FDA under an Emergency Use Authorization (EUA).  This EUA will remain in effect (meaning this test can be used) for the duration of the COVID-19 declaration under Section 564(b)(1) of the Act, 21 U.S.C. section 360bbb-3(b)(1), unless the authorization is terminated or revoked sooner. Performed at [facility], [address] [place], [place] [address]   CULTURE, BLOOD (ROUTINE X 2) w Reflex to ID Panel     Status: None (Preliminary result)   Collection Time: 07/30/19  6:55 PM   Specimen: BLOOD  Result Value Ref Range Status   Specimen Description BLOOD RIGHT ANTECUBITAL  Final   Special Requests   Final    BOTTLES DRAWN AEROBIC AND ANAEROBIC Blood Culture adequate volume   Culture   Final     NO GROWTH 4 DAYS Performed at [facility], [address], [place], [place] [address]    Report Status PENDING  Incomplete  CULTURE, BLOOD (ROUTINE X 2) w Reflex to ID Panel     Status: None (Preliminary result)   Collection Time: 07/30/19  6:55 PM   Specimen: BLOOD  Result Value Ref Range Status   Specimen Description BLOOD RIGHT HAND  Final   Special Requests   Final    BOTTLES DRAWN AEROBIC AND ANAEROBIC Blood Culture adequate volume   Culture   Final    NO GROWTH 4 DAYS Performed at [facility], [address], [place], [place] [address]    Report Status PENDING  Incomplete  Aerobic/Anaerobic Culture (  surgical/deep wound)     Status: None (Preliminary result)   Collection Time: 07/31/19 11:35 AM   Specimen: Wound; Tissue  Result Value Ref Range Status   Specimen Description   Final    WOUND RIGHT LATERAL L4 SPINE Performed at [facility] [address], [place], [place] [address]    Special Requests   Final    Normal Performed at [facility], [place], [place] [address]    Gram Stain NO WBC SEEN NO ORGANISMS SEEN   Final   Culture   Final    NO GROWTH 2 DAYS NO ANAEROBES ISOLATED; CULTURE IN PROGRESS FOR 5 DAYS Performed at [facility], [place] [address] [place],  [address]    Report Status PENDING  Incomplete    Coagulation Studies: No results for input(s): LABPROT, INR in the last 72 hours.  Urinalysis: No results for input(s): COLORURINE, LABSPEC, PHURINE, GLUCOSEU, HGBUR, BILIRUBINUR, KETONESUR, PROTEINUR, UROBILINOGEN, NITRITE, LEUKOCYTESUR in the last 72 hours.  Invalid input(s): APPERANCEUR    Imaging: No results found.   Medications:   . sodium chloride Stopped (08/01/19 0106)   . atorvastatin  40 mg Oral QHS  . carvedilol  25 mg Oral BID WC  . docusate sodium  100 mg Oral BID  . epoetin (EPOGEN/PROCRIT) injection  10,000 Units Subcutaneous Weekly  . famotidine  20 mg Oral QHS   . ferrous sulfate  325 mg Oral Daily  . gabapentin  100 mg Oral TID  . hydrALAZINE  25 mg Oral BID  . insulin aspart  0-5 Units Subcutaneous QHS  . insulin aspart  0-9 Units Subcutaneous TID WC  . isosorbide mononitrate  30 mg Oral Daily  . lidocaine  1 patch Transdermal Q24H  . polyethylene glycol  17 g Oral Daily  . sodium bicarbonate  1,300 mg Oral BID  . sodium chloride flush  10 mL Intravenous Q12H   sodium chloride, acetaminophen **OR** acetaminophen, HYDROmorphone (DILAUDID) injection, magnesium hydroxide, nitroGLYCERIN, ondansetron **OR** ondansetron (ZOFRAN) IV, oxyCODONE  Assessment/ Plan:  58 y.o. male with  hypertension, diabetes mellitus type II, diabetic neuropathy, systolic congestive heart failure, peripheral vascular disease status post left toe amputations, autonomic neuropathy, coronary artery disease status post CABG, history of osteomyelitis, who has been admitted to [facility] on 07/27/2019 for acute exacerbation of congestive heart failure.   Active Problems:   Acute on chronic combined systolic and diastolic CHF (congestive heart failure) (HCC)   Acute on chronic respiratory failure with hypoxemia (HCC)   Acute midline low back pain without sciatica   #.AKI on  CKD st 4 Recent Labs    07/31/19 0524 08/01/19 1026 08/02/19 0443 08/03/19 0445  CREATININE 4.43* 4.59* 4.92* 5.19*  Baseline Cr 2.42 from April 2020/ GFR 29. Cause of AKI is unclear at present, but likely cardiorenal & ATN from excessive diuresis  - antibiotics have been discontinued -Serum creatinine is noted to be  worse today.  Patient is nonoliguric.  Urine output 600 cc. Has some confusion.  Maybe mild uremic is contrinuting BUN/Cr are worse Discussed starting benefits and risks of starting HD for AKI with patient and his wife. They both agree to proceed. Will consult vascular surgery for permcath  Start HD on Friday and start d/c planning for outpatient dialysis- patient lives in [place]-  requesting [place] dialysis  # LE edema, Chronic sys CHF EF 30-35 % Daily standing weights We will be able to start ARB early next week once he  gets stable with HD  #. Anemia of CKD  Lab Results  Component Value Date   HGB 7.6 (L) 08/02/2019  discussed risks and benefits of starting EPO with patient and his wife Started on EPO. Monitor closely  #. SHPTH  No results found for: PTH Lab Results  Component Value Date   PHOS 5.7 (H) 08/03/2019  monitor level   #. Diabetes type 2 with CKD Hemoglobin A1C (%)  Date Value  07/19/2013 12.2 (H)   Hgb A1c MFr Bld (%)  Date Value  07/28/2019 7.2 (H)   #  Back pain Consider local lidocaine patches Surgical team is following along.  No indication for I&D   LOS: [place] 9/17/202011:25 [place], [place]

## 2019-08-03 NOTE — Progress Notes (Signed)
Patient's pulse ox dropped to 89% on room air, applied 2 liters of oxygen to improve saturation to 96%.

## 2019-08-04 ENCOUNTER — Inpatient Hospital Stay
Admission: EM | Disposition: A | Discharge status: Home Health | Payer: Self-pay | Source: Home / Self Care | Attending: Internal Medicine

## 2019-08-04 ENCOUNTER — Encounter: Payer: Self-pay | Admitting: Vascular Surgery

## 2019-08-04 DIAGNOSIS — N185 Chronic kidney disease, stage 5: Secondary | ICD-10-CM

## 2019-08-04 HISTORY — PX: DIALYSIS/PERMA CATHETER INSERTION: CATH118288

## 2019-08-04 LAB — CULTURE, BLOOD (ROUTINE X 2)
Culture: NO GROWTH
Culture: NO GROWTH
Special Requests: ADEQUATE
Special Requests: ADEQUATE

## 2019-08-04 LAB — BASIC METABOLIC PANEL
Anion gap: 11 (ref 5–15)
BUN: 97 mg/dL — ABNORMAL HIGH (ref 6–20)
CO2: 22 mmol/L (ref 22–32)
Calcium: 7.7 mg/dL — ABNORMAL LOW (ref 8.9–10.3)
Chloride: 103 mmol/L (ref 98–111)
Creatinine, Ser: 4.87 mg/dL — ABNORMAL HIGH (ref 0.61–1.24)
GFR calc Af Amer: 14 mL/min — ABNORMAL LOW (ref >60)
GFR calc non Af Amer: 12 mL/min — ABNORMAL LOW (ref >60)
Glucose, Bld: 242 mg/dL — ABNORMAL HIGH (ref 70–99)
Potassium: 4.3 mmol/L (ref 3.5–5.1)
Sodium: 136 mmol/L (ref 135–145)

## 2019-08-04 LAB — GLUCOSE, CAPILLARY
Glucose-Capillary: 209 mg/dL — ABNORMAL HIGH (ref 70–99)
Glucose-Capillary: 204 mg/dL — ABNORMAL HIGH (ref 70–99)
Glucose-Capillary: 237 mg/dL — ABNORMAL HIGH (ref 70–99)
Glucose-Capillary: 188 mg/dL — ABNORMAL HIGH (ref 70–99)
Glucose-Capillary: 243 mg/dL — ABNORMAL HIGH (ref 70–99)

## 2019-08-04 LAB — ALBUMIN: Albumin: 1.8 g/dL — ABNORMAL LOW (ref 3.5–5.0)

## 2019-08-04 LAB — PHOSPHORUS: Phosphorus: 5.6 mg/dL — ABNORMAL HIGH (ref 2.5–4.6)

## 2019-08-04 SURGERY — DIALYSIS/PERMA CATHETER INSERTION
Anesthesia: Choice

## 2019-08-04 MED ORDER — EPINEPHRINE 0.3 MG/0.3ML IJ SOAJ
0.3000 mg | Freq: Once | INTRAMUSCULAR | Status: DC
  Filled 2019-08-04: qty 0.3

## 2019-08-04 MED ORDER — FAMOTIDINE 20 MG PO TABS: 40.0000 mg | ORAL_TABLET | Freq: Once | ORAL | Status: DC | PRN

## 2019-08-04 MED ORDER — FENTANYL CITRATE (PF) 100 MCG/2ML IJ SOLN
INTRAMUSCULAR | Status: AC
  Filled 2019-08-04: qty 2

## 2019-08-04 MED ORDER — NEPRO/CARBSTEADY PO LIQD
237.0000 mL | Freq: Three times a day (TID) | ORAL | Status: DC
  Administered 2019-08-05 – 2019-08-07 (×5): 237 mL via ORAL

## 2019-08-04 MED ORDER — CHLORHEXIDINE GLUCONATE CLOTH 2 % EX PADS
6.0000 | MEDICATED_PAD | Freq: Every day | CUTANEOUS | Status: DC
  Administered 2019-08-06 – 2019-08-08 (×2): 6 via TOPICAL

## 2019-08-04 MED ORDER — HYDROMORPHONE HCL 1 MG/ML IJ SOLN: 1.0000 mg | Freq: Once | INTRAMUSCULAR | Status: DC | PRN

## 2019-08-04 MED ORDER — RENA-VITE PO TABS
1.0000 | ORAL_TABLET | Freq: Every day | ORAL | Status: DC
  Administered 2019-08-04 – 2019-08-07 (×4): 1 via ORAL
  Filled 2019-08-04 (×5): qty 1

## 2019-08-04 MED ORDER — DIPHENHYDRAMINE HCL 50 MG/ML IJ SOLN: 50.0000 mg | Freq: Once | INTRAMUSCULAR | Status: DC | PRN

## 2019-08-04 MED ORDER — METHYLPREDNISOLONE SODIUM SUCC 125 MG IJ SOLR: 125.0000 mg | Freq: Once | INTRAMUSCULAR | Status: DC | PRN

## 2019-08-04 MED ORDER — CEFAZOLIN SODIUM-DEXTROSE 1-4 GM/50ML-% IV SOLN
1.0000 g | Freq: Once | INTRAVENOUS | Status: AC
  Administered 2019-08-04: 09:00:00 1 g via INTRAVENOUS

## 2019-08-04 MED ORDER — HEPARIN SODIUM (PORCINE) 10000 UNIT/ML IJ SOLN
INTRAMUSCULAR | Status: AC
  Filled 2019-08-04: qty 1

## 2019-08-04 MED ORDER — SODIUM CHLORIDE 0.9 % IV SOLN
INTRAVENOUS | Status: DC
  Administered 2019-08-04 (×2): via INTRAVENOUS

## 2019-08-04 MED ORDER — MIDAZOLAM HCL 2 MG/ML PO SYRP: 8.0000 mg | ORAL_SOLUTION | Freq: Once | ORAL | Status: DC | PRN

## 2019-08-04 MED ORDER — MIDAZOLAM HCL 2 MG/2ML IJ SOLN
INTRAMUSCULAR | Status: DC | PRN
  Administered 2019-08-04: 1 mg via INTRAVENOUS

## 2019-08-04 MED ORDER — ONDANSETRON HCL 4 MG/2ML IJ SOLN: 4.0000 mg | Freq: Four times a day (QID) | INTRAMUSCULAR | Status: DC | PRN

## 2019-08-04 MED ORDER — HEPARIN SODIUM (PORCINE) 1000 UNIT/ML DIALYSIS
20.0000 [IU]/kg | INTRAMUSCULAR | Status: DC | PRN
  Filled 2019-08-04: qty 3

## 2019-08-04 MED ORDER — INSULIN ASPART 100 UNIT/ML ~~LOC~~ SOLN
2.0000 [IU] | Freq: Three times a day (TID) | SUBCUTANEOUS | Status: DC
  Administered 2019-08-04 – 2019-08-08 (×12): 2 [IU] via SUBCUTANEOUS
  Filled 2019-08-04 (×12): qty 1

## 2019-08-04 MED ORDER — INSULIN DETEMIR 100 UNIT/ML ~~LOC~~ SOLN
6.0000 [IU] | Freq: Every day | SUBCUTANEOUS | Status: DC
  Administered 2019-08-04 – 2019-08-07 (×4): 6 [IU] via SUBCUTANEOUS
  Filled 2019-08-04 (×4): qty 0.06

## 2019-08-04 MED ORDER — FENTANYL CITRATE (PF) 100 MCG/2ML IJ SOLN
INTRAMUSCULAR | Status: DC | PRN
  Administered 2019-08-04: 50 ug via INTRAVENOUS

## 2019-08-04 MED ORDER — CEFAZOLIN SODIUM-DEXTROSE 1-4 GM/50ML-% IV SOLN: 1.0000 g | Freq: Once | INTRAVENOUS | Status: DC

## 2019-08-04 MED ORDER — MIDAZOLAM HCL 5 MG/5ML IJ SOLN
INTRAMUSCULAR | Status: AC
  Filled 2019-08-04: qty 5

## 2019-08-04 SURGICAL SUPPLY — 11 items
CATH CANNON HEMO 15FR 19 (HEMODIALYSIS SUPPLIES) ×3 IMPLANT
COVER PROBE U/S 5X48 (MISCELLANEOUS) ×3 IMPLANT
DRAPE INCISE IOBAN 66X45 STRL (DRAPES) ×3 IMPLANT
GUIDEWIRE SUPER STIFF .035X180 (WIRE) ×3 IMPLANT
NEEDLE ENTRY 21GA 7CM ECHOTIP (NEEDLE) ×3 IMPLANT
PACK ANGIOGRAPHY (CUSTOM PROCEDURE TRAY) ×3 IMPLANT
SET INTRO CAPELLA COAXIAL (SET/KITS/TRAYS/PACK) ×3 IMPLANT
SUT MNCRL 4-0 (SUTURE) ×2
SUT MNCRL 4-0 27XMFL (SUTURE) ×1
SUT VICRYL+ 3-0 36IN CT-1 (SUTURE) ×3 IMPLANT
SUTURE MNCRL 4-0 27XMF (SUTURE) ×1 IMPLANT

## 2019-08-04 NOTE — Progress Notes (Signed)
Post HD Tx Note Pt tolerated well the tx. BFR prescribed was maintained UF goal prescribed was met. Pt reported no chest pain or SOB after treatment. Pt vitals are WDl. Catheter continues with no complications, heparin locked, capped and clamed.   08/04/19 1745  Hand-Off documentation  Report given to (Full Name) Lennart Pall RN  Report received from (Full Name) Beatris Ship  Vital Signs  Temp 98.4 F (36.9 C)  Temp Source Oral  Pulse Rate 78  Pulse Rate Source Monitor  Resp 16  BP 132/60  BP Location Right Arm  BP Method Automatic  Patient Position (if appropriate) Lying  Oxygen Therapy  SpO2 96 %  O2 Device Room Air  Pain Assessment  Pain Scale 0-10  Pain Score 0  Post-Hemodialysis Assessment  Rinseback Volume (mL) 250 mL  KECN 23.6 V  Dialyzer Clearance Lightly streaked  Duration of HD Treatment -hour(s) 2 hour(s)  Hemodialysis Intake (mL) 500 mL  UF Total -Machine (mL) 1000 mL  Net UF (mL) 500 mL  Tolerated HD Treatment Yes  Post-Hemodialysis Comments  (Pt tolerated well the Tx)  Hemodialysis Catheter Right Internal jugular Double lumen Permanent (Tunneled)  Placement Date/Time: 08/04/19 0931   Time Out: Correct patient;Correct site;Correct procedure  Maximum sterile barrier precautions: Hand hygiene;Cap;Mask;Sterile gloves;Large sterile sheet;Sterile gown  Site Prep: Chlorhexidine (preferred)  Local Anes...  Site Condition No complications  Blue Lumen Status Heparin locked  Red Lumen Status Heparin locked  Purple Lumen Status N/A  Catheter fill solution Heparin 1000 units/ml  Catheter fill volume (Arterial) 2 cc  Catheter fill volume (Venous) 2  Dressing Type Gauze/Drain sponge  Dressing Status Clean;Intact;Dry  Drainage Description None  Dressing Change Due 08/05/19  Post treatment catheter status Capped and Clamped

## 2019-08-04 NOTE — Progress Notes (Signed)
Post HD Tx assessment   08/04/19 1730  Neurological  Level of Consciousness Alert  Orientation Level Oriented X4 (just feeling lethargic from Cath placement easily Oriented)  Respiratory  Respiratory Pattern Regular;Unlabored  Bilateral Breath Sounds Clear;Diminished  Cardiac  Pulse Regular  Heart Sounds S1, S2  ECG Monitor No  Vascular  R Radial Pulse +2  L Radial Pulse +2  Musculoskeletal  Musculoskeletal (WDL) X  Generalized Weakness Yes  Gastrointestinal  Bowel Sounds Assessment Active  GU Assessment  Genitourinary (WDL) WDL  Urine Characteristics  Urine Color Yellow/straw  Urine Appearance Clear  Psychosocial  Psychosocial (WDL) WDL

## 2019-08-04 NOTE — Progress Notes (Signed)
Initial Nutrition Assessment  DOCUMENTATION CODES:   Obesity unspecified  INTERVENTION:   Nepro Shake po TID, each supplement provides 425 kcal and 19 grams protein  Rena-vite daily   Liberalize diet   NUTRITION DIAGNOSIS:   Increased nutrient needs related to chronic illness(ESRD new HD) as evidenced by increased estimated needs  GOAL:   Patient will meet greater than or equal to 90% of their needs  MONITOR:   PO intake, Supplement acceptance, Labs, Weight trends, Skin  REASON FOR ASSESSMENT:   Consult Diet education  ASSESSMENT:   58 y.o. male with a hx of CAD s/p four-vessel CABG (04/2014), HFrEF secondary to ICM, NSVT, paroxysmal SVT, PVD, poorly controlled insulin-dependent diabetes with diabetic foot ulcers and diabetic peripheral neuropathy requiring amputations of the left toes, osteomyelitis, CKD stage IV, hypertension, hyperlipidemia, asymptomatic orthostatic hypotension requiring new HD 9/18  RD working remotely.  Pt with fairly good appetite and oral intake in hospital; pt documented to be eating 100% of meals. Per MD note, pt somnolent today with intermittent confusion s/p HD. Pt is not appropriate for diet education today. RD will add supplements and vitamins to help pt meet his estimated needs and replace loses from HD. Will plan to provide new HD diet education once pt is feeling better and prior to discharge.   Per chart, pt appears fairly weight stable pta.   Medications reviewed and include: epoetin, pepcid, ferrous sulfate, heparin, insulin, miralax, Na bicarb, senokot   Labs reviewed: K 4.3 wnl, BUN 97(H), creat 4.87(H), P 5.6(H), Ca 7.7(L) adj. 9.46 wnl, alb 1.8(L) Hgb 7.6(L)- 9/16 cbgs- 209, 204, 237 x 24 hrs AIC 7.2(H)- 9/11  Uable to complete Nutrition-Focused physical exam at this time.   Diet Order:   Diet Order            Diet renal/carb modified with fluid restriction Diet-HS Snack? Nothing; Fluid restriction: 1200 mL Fluid; Room  service appropriate? Yes; Fluid consistency: Thin  Diet effective now             EDUCATION NEEDS:   Not appropriate for education at this time  Skin:  Skin Assessment: Reviewed RN Assessment(incision back)  Last BM:  9/18- type 6  Height:   Ht Readings from Last 1 Encounters:  08/04/19 6\' 1"  (1.854 m)    Weight:   Wt Readings from Last 1 Encounters:  08/04/19 128.5 kg    Ideal Body Weight:  83.6 kg  BMI:  Body mass index is 37.38 kg/m.  Estimated Nutritional Needs:   Kcal:  2600-2900kcal/day  Protein:  >125g/day  Fluid:  2L/day  Koleen Distance MS, RD, LDN Pager #- [phone] Office#- [phone] After Hours Pager: [phone]

## 2019-08-04 NOTE — Progress Notes (Signed)
Pre HD Tx initiation note    08/04/19 1515  Hand-Off documentation  Report given to (Full Name) Beatris Ship RN   Report received from (Full Name) Lennart Pall RN  Vital Signs  Temp 98.4 F (36.9 C)  Temp Source Oral  Pulse Rate 73  Pulse Rate Source Monitor  BP 139/70  BP Location Right Arm  BP Method Automatic  Patient Position (if appropriate) Lying  Oxygen Therapy  SpO2 95 %  O2 Device Room Air  Pain Assessment  Pain Scale 0-10  Pain Score 0  Time-Out for Hemodialysis  What Procedure? HD  Pt Identifiers(min of two) First/Last Name;MRN/Account#  Correct Site? Yes  Correct Side? Yes  Correct Procedure? Yes  Consents Verified? Yes  Safety Precautions Reviewed? Yes  Engineer, civil (consulting) Number 2  Station Number 3  UF/Alarm Test Passed  Conductivity: Meter 13.8  Conductivity: Machine  14  pH 7.2  Reverse Osmosis main  Normal Saline Lot Number [id number]  Dialyzer Lot Number [id number]  Disposable Set Lot Number [id number]  Machine Temperature 98.6 F (37 C)  Musician and Audible Yes  Blood Lines Intact and Secured Yes  Pre Treatment Patient Checks  Vascular access used during treatment Catheter  HD catheter dressing before treatment WDL  Patient is receiving dialysis in a chair  (In Bed)  Hepatitis B Surface Antigen Results Pending  Isolation Initiated Yes  Hepatitis B Surface Antibody  (Pending)  Date Hepatitis B Surface Antibody Drawn 08/04/19  Hemodialysis Consent Verified Yes  Hemodialysis Standing Orders Initiated Yes  ECG (Telemetry) Monitor On Yes  Prime Ordered Normal Saline  Length of  DialysisTreatment -hour(s) 2 Hour(s)  Dialysis Treatment Comments  (Na140)  Dialyzer Elisio 17H NR  Dialysis Anticoagulant None  Blood Flow Rate Ordered 300 mL/min  Ultrafiltration Goal 0.5 Liters  Pre Treatment Labs  (Hep B)  Education / Care Plan  Dialysis Education Provided Yes  Documented Education in Care Plan Yes  Hemodialysis Catheter Right  Internal jugular Double lumen Permanent (Tunneled)  Placement Date/Time: 08/04/19 0931   Time Out: Correct patient;Correct site;Correct procedure  Maximum sterile barrier precautions: Hand hygiene;Cap;Mask;Sterile gloves;Large sterile sheet;Sterile gown  Site Prep: Chlorhexidine (preferred)  Local Anes...  Site Condition No complications  Blue Lumen Status Blood return noted  Red Lumen Status Blood return noted  Purple Lumen Status N/A  Catheter fill solution Heparin 1000 units/ml  Dressing Type Gauze/Drain sponge  Dressing Change Due 08/05/19

## 2019-08-04 NOTE — Progress Notes (Signed)
[name] [name] at [place] NAME: [name] [name]    MR#:  [id number]  DATE OF BIRTH:  11-Mar-1961  SUBJECTIVE:   Denies any complaints other than low back pain. Per wife patient has been sleeping a lot for last couple days. Pain meds have been adjusted. REVIEW OF SYSTEMS:   Review of Systems  Constitutional: Negative for chills, fever and weight loss.  HENT: Negative for ear discharge, ear pain and nosebleeds.   Eyes: Negative for blurred vision, pain and discharge.  Respiratory: Positive for shortness of breath. Negative for sputum production, wheezing and stridor.   Cardiovascular: Positive for leg swelling. Negative for chest pain, palpitations, orthopnea and PND.  Gastrointestinal: Negative for abdominal pain, diarrhea, nausea and vomiting.  Genitourinary: Negative for frequency and urgency.  Musculoskeletal: Positive for back pain. Negative for joint pain.  Neurological: Positive for weakness. Negative for sensory change, speech change and focal weakness.  Psychiatric/Behavioral: Negative for depression and hallucinations. The patient is not nervous/anxious.    Tolerating Diet:yes Tolerating PT: home health  DRUG ALLERGIES:   Allergies  Allergen Reactions  . 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 Doesn't believe he has an allergy to PCN as he has taken it previously, according to wife.  [name] [name] [Piperacillin Sod-Tazobactam So] Anaphylaxis    Allergic interstitial nephritis to Unasyn/Zosyn in 2014. Doesn't believe he has an allergy to PCN as he has taken it previously, according to wife.    VITALS:  Blood pressure 134/60, pulse 72, temperature 98.3 F (36.8 C), temperature source Oral, resp. rate 19, height 6\' 1"  (1.854 m), weight 128.5 kg, SpO2 92 %.  PHYSICAL EXAMINATION:   Physical Exam  GENERAL:  58 y.o.-year-old patient lying in the bed with no acute  distress. He is obese, chronically ill EYES: Pupils equal, round, reactive to light and accommodation. No scleral icterus. Extraocular muscles intact.  HEENT: Head atraumatic, normocephalic. Oropharynx and nasopharynx clear.  NECK:  Supple, no jugular venous distention. No thyroid enlargement, no tenderness.  LUNGS: decreased breath sounds bilaterally, no wheezing, rales, rhonchi. No use of accessory muscles of respiration.  CARDIOVASCULAR: S1, S2 normal. No murmurs, rubs, or gallops.  ABDOMEN: Soft, nontender, nondistended. Bowel sounds present. No organomegaly or mass.  EXTREMITIES: No cyanosis, clubbing  + edema b/l.    NEUROLOGIC: Cranial nerves II through XII are intact. No focal Motor or sensory deficits b/l. Some tenderness in the lower back. PSYCHIATRIC:  patient is alert and oriented x 3.  SKIN: mild redness in the lumbar area marked  LABORATORY PANEL:  CBC Recent Labs  Lab 07/29/19 0520  08/02/19 0443  WBC 12.4*  --   --   HGB 7.0*   < > 7.6*  HCT 22.0*  --   --   PLT 259  --   --    < > = values in this interval not displayed.    Chemistries  Recent Labs  Lab 07/31/19 0524  08/04/19 0512  NA 133*   < > 136  K 4.9  4.8   < > 4.3  CL 100   < > 103  CO2 23   < > 22  GLUCOSE 140*   < > 242*  BUN 74*   < > 97*  CREATININE 4.43*   < > 4.87*  CALCIUM 7.8*   < > 7.7*  MG 2.1  --   --    < > =  values in this interval not displayed.   Cardiac Enzymes No results for input(s): TROPONINI in the last 168 hours. RADIOLOGY:  No results found. ASSESSMENT AND PLAN:  [name] Damiano Murphyis a 58 y.o.malewith a hx of CAD s/p four-vessel CABG (04/2014), HFrEF secondary to ICM, NSVT, paroxysmal SVT, PVD, poorly controlled insulin-dependent diabetes with diabetic foot ulcers and diabetic peripheral neuropathyrequiring amputations of the left toes, osteomyelitis, CKD stage IV, hypertension, hyperlipidemia  1. Acute on chronic systolic dysfunction with history of ischemic  cardiomyopathy -was on IV Lasix 40 mg BID--discontinued ---stop lasix due to rising creat --UOP 9.2 liters. Clinically looks better. sats 95% on RA -monitor input output, creatinine 3.45--4.43--4.61--4.92--5.1 -appreciate cardiology and nephrology input -low sodium diet -patient has history of coronary artery disease status post CABG  2. Low back pain acute --MRI of the lumbar spine shows Large round 7 centimeter nonspecific soft tissue area  in the subcutaneous fat overlying the L4 vertebra to the right of midline.-This could be an infectious phlegmon or a hematoma.--more favoring hemtoam per neurosx - ID consulted.-- was on IV cefazolin-- discontinued --okay to start physical therapy per neurosurgery -status post ultrasound guided drain of bloody fluid by IR on September 14th-- fluid culture negative  3. Acute on chronic kidney disease stage IV/with encephalopathy uremic mental status changes -monitor input output and creatinine -avoid nephrotoxic ends -continue PO bicarb -hold lasix -creat rising 4.6--4.92--5.1 -Dr. Candiss [name] discussed with patient and wife. They're agreeable starting dialysis. -Vascular consultation appreciated. Patient has a right upper chest permanent catheter for dialysis  4. Acute on chronic anemia secondary to kidney disease, questionable hematoma in the back -will transfuse one unit today -came in with hemoglobin of 9.2--- 7.0-- one unit blood transfusion---7.7--7.6 -SCD -hold aspirin  5. Type II diabetes sliding scale insulin  6. Hyperlipidemia continue statins  Spoke with Mrs. [name] [name] and updated today.  Management plans discussed with the patient, family and they are in agreement.  CODE STATUS: full  DVT Prophylaxis: SCD  TOTAL TIME TAKING CARE OF THIS PATIENT: *30* minutes.  >50% time spent on counselling and coordination of care  POSSIBLE D/C IN *?* DAYS, DEPENDING ON CLINICAL CONDITION.  Note: This dictation was prepared with Dragon dictation  along with smaller phrase technology. Any transcriptional errors that result from this process are unintentional.  [name] [name] M.D on 08/04/2019 at 2:50 PM  Between 7am to 6pm - Pager - [phone]  After 6pm go to [email or web address] - password [email or web address] [facility]  Office  [phone]  CC: Primary care physician; Tracie Harrier, MDPatient ID: [name] [name], male   DOB: 06/26/1961, 58 y.o.   MRN: [id number]

## 2019-08-04 NOTE — Op Note (Signed)
[facility]   OPERATIVE NOTE     PROCEDURE: 1. Insertion of a right IJ tunneled dialysis catheter. 2. Catheter placement and cannulation under ultrasound and fluoroscopic guidance  PRE-OPERATIVE DIAGNOSIS: end-stage renal requiring hemodialysis  POST-OPERATIVE DIAGNOSIS: same as above  SURGEON: Hortencia Pilar  ANESTHESIA: Conscious sedation was administered under my direct supervision by the interventional radiology RN. IV Versed plus fentanyl were utilized. Continuous ECG, pulse oximetry and blood pressure was monitored throughout the entire procedure.  Conscious sedation was for a total of 40 minutes.  ESTIMATED BLOOD LOSS: Minimal  FINDING(S): 1.  Tips of the catheter in the right atrium on fluoroscopy 2.  No obvious pneumothorax on fluoroscopy  SPECIMEN(S):  none  INDICATIONS:   [name] [name] is a 57 y.o. male  presents with end stage renal disease.  Therefore, the patient requires a tunneled dialysis catheter placement.  The patient is informed of  the risks catheter placement include but are not limited to: bleeding, infection, central venous injury, pneumothorax, possible venous stenosis, possible malpositioning in the venous system, and possible infections related to long-term catheter presence.  The patient was aware of these risks and agreed to proceed.  DESCRIPTION: The patient was taken back to Special Procedure suite.  Prior to sedation, the patient was given IV antibiotics.  After obtaining adequate sedation, the patient was prepped and draped in the standard fashion for a right IJ tunneled dialysis catheter placement.  Appropriate Time Out is called.     The right neck and chest wall are then infiltrated with 1% Lidocaine with epinepherine.  A 19 cm catheter is then selected, opened on the back table and prepped. Ultrasound is placed in a sterile sleeve.  Under ultrasound guidance, the right IJ vein is examined and is noted to be echolucent  and easily compressible indicating patency.   An image is recorded for the permanent record.  The right IJ vein is cannulated with the microneedle under direct ultrasound vissualization.  A Microwire followed by a micro sheath is then inserted without difficulty.   A J-wire was then advanced under fluoroscopic guidance into the inferior vena cava and the wire was secured.  Small counter incision was then made at the wire insertion site. A small pocket was fashioned with blunt dissection to allow easier passage of the cuff.  The dilator and peel-away sheath are then advanced over the wire under fluoroscopic guidance. The catheters and advanced through the peel-away sheath. It is approximated to the right chest after verifying the tips at the atrial caval junction and an exit site is selected.  Small incision is made at the selected exit site and the tunneling device was passed subcutaneously to the counter incision. Catheter is then pulled through the subcutaneous tunnel. The catheter is then verified for tip position under fluoroscopy, transected and the hub assembly connected.    Each port was tested by aspirating and flushing.  No resistance was noted.  Each port was then thoroughly flushed with heparinized saline.  The catheter was secured in placed with two interrupted stitches of 0 silk tied to the catheter.  The counter incision was closed with a U-stitch of 4-0 Monocryl.  The insertion site is then cleaned and sterile bandages applied including a Biopatch.  Each port was then packed with concentrated heparin (1000 Units/mL) at the manufacturer recommended volumes to each port.  Sterile caps were applied to each port.  On completion fluoroscopy, the tips of the catheter were in the  right atrium, and there was no evidence of pneumothorax.  COMPLICATIONS: None  CONDITION: Unchanged   Hortencia Pilar [facility] Office: [phone]   08/04/2019, 9:54 AM

## 2019-08-04 NOTE — Progress Notes (Signed)
Inpatient Diabetes Program Recommendations  AACE/ADA: New Consensus Statement on Inpatient Glycemic Control  Target Ranges:  Prepandial:   less than 140 mg/dL      Peak postprandial:   less than 180 mg/dL (1-2 hours)      Critically ill patients:  140 - 180 mg/dL   Results for [name] [name], [name] [name] (MRN [id number]) as of 08/03/2019 15:09  Ref. Range 08/02/2019 08:27 08/02/2019 11:56 08/02/2019 16:31 08/02/2019 20:56 08/03/2019 07:59 08/03/2019 11:38  Glucose-Capillary Latest Ref Range: 70 - 99 mg/dL 163 (H) 185 (H) 192 (H) 242 (H) 226 (H) 226 (H)  Results for [name] [name], [name] [name] (MRN [id number]) as of 08/04/2019 10:23  Ref. Range 08/03/2019 17:00 08/03/2019 21:06 08/04/2019 07:44 08/04/2019 10:02  Glucose-Capillary Latest Ref Range: 70 - 99 mg/dL 210 (H) 217 (H) 209 (H) 204 (H)   Review of Glycemic Control  Diabetes history: DM2 Outpatient Diabetes medications: Amaryl 4 mg BID Current orders for Inpatient glycemic control: Novolog 0-9 units TID with meals, Novolog 0-5 units QHS  Inpatient Diabetes Program Recommendations:   Glucose consistently >200.  Consider Levemir 6 units  Novolog 2 units tid meal coverage if pt consumes at least 50% of meals.   Thanks, Tama Headings RN, MSN, BC-ADM Inpatient Diabetes Coordinator Team Pager [phone] (8a-5p)

## 2019-08-04 NOTE — Progress Notes (Signed)
Progress Note  Patient Name: [name] [name] Date of Encounter: 08/04/2019  Primary Cardiologist: Ida Rogue, MD   Subjective   Patient somnolent s/p receiving HD port earlier this morning; however, he denied CP, palpitations, or racing HR. He did note that his back pain is improving. He continues to report b/l LEE and abdominal distention.   Per wife, patient does not stay awake for very long and has been intermittently confused, such as not recalling the year of her birthday.   Inpatient Medications    Scheduled Meds: . atorvastatin  40 mg Oral QHS  . carvedilol  25 mg Oral BID WC  . Chlorhexidine Gluconate Cloth  6 each Topical Q0600  . EPINEPHrine  0.3 mg Intramuscular Once  . epoetin (EPOGEN/PROCRIT) injection  10,000 Units Subcutaneous Weekly  . famotidine  20 mg Oral QHS  . fentaNYL      . ferrous sulfate  325 mg Oral Daily  . gabapentin  100 mg Oral TID  . heparin      . hydrALAZINE  25 mg Oral BID  . insulin aspart  0-5 Units Subcutaneous QHS  . insulin aspart  0-9 Units Subcutaneous TID WC  . isosorbide mononitrate  30 mg Oral Daily  . lidocaine  1 patch Transdermal Q24H  . midazolam      . polyethylene glycol  17 g Oral Daily  . senna-docusate  1 tablet Oral BID  . sodium bicarbonate  1,300 mg Oral BID  . sodium chloride flush  10 mL Intravenous Q12H   Continuous Infusions: . sodium chloride Stopped (08/01/19 0106)   PRN Meds: sodium chloride, acetaminophen **OR** acetaminophen, heparin, HYDROmorphone (DILAUDID) injection, HYDROmorphone (DILAUDID) injection, magnesium hydroxide, nitroGLYCERIN, ondansetron **OR** ondansetron (ZOFRAN) IV, ondansetron (ZOFRAN) IV, oxyCODONE   Vital Signs    Vitals:   08/04/19 1015 08/04/19 1027 08/04/19 1043 08/04/19 1043  BP: 126/73 (!) 145/69  134/60  Pulse: 73 70  72  Resp: 15 17 16 19   Temp:    98.3 F (36.8 C)  TempSrc:    Oral  SpO2: 93% 97%  92%  Weight:      Height:        Intake/Output Summary (Last 24  hours) at 08/04/2019 1128 Last data filed at 08/04/2019 0106 Gross per 24 hour  Intake 240 ml  Output 675 ml  Net -435 ml   Last 3 Weights 08/04/2019 08/04/2019 08/02/2019  Weight (lbs) 283 lb 4.7 oz 283 lb 6.4 oz 282 lb 1.6 oz  Weight (kg) 128.5 kg 128.549 kg 127.96 kg      Telemetry    Not currently on telemetry - Personally Reviewed  ECG    No new tracings - Personally Reviewed  Physical Exam   GEN: Obese male. No acute distress.  Sleeping s/p port insertion; arousable to voice and touch though somnolent.  Neck: JVD difficult to assess due to body habitus  Cardiac: RRR, distant heart sounds. no murmurs, rubs, or gallops.  Respiratory: Diminished breath sounds bilatearlly GI: Obese, semi firm, nontender MS: 2+ bilateral lower extremity edema. L toe amputation. Neuro:  Intermittent disorientation, somnolence Psych: Normal affect, somnolent  Labs    High Sensitivity Troponin:   Recent Labs  Lab 07/27/19 1711 07/27/19 1909 07/28/19 0303 07/28/19 0547  TROPONINIHS 32* 55* 35* 31*      Cardiac EnzymesNo results for input(s): TROPONINI in the last 168 hours. No results for input(s): TROPIPOC in the last 168 hours.   Chemistry Recent Labs  Lab 08/02/19  8177 08/03/19 0445 08/04/19 0512  NA 134* 133* 136  K 4.4 4.6 4.3  CL 99 100 103  CO2 24 22 22   GLUCOSE 164* 286* 242*  BUN 91* 107* 97*  CREATININE 4.92* 5.19* 4.87*  CALCIUM 7.7* 7.6* 7.7*  ALBUMIN 1.9* 1.9* 1.8*  GFRNONAA 12* 11* 12*  GFRAA 14* 13* 14*  ANIONGAP 11 11 11      Hematology Recent Labs  Lab 07/29/19 0520 07/30/19 0634 08/02/19 0443  WBC 12.4*  --   --   RBC 2.40*  --   --   HGB 7.0* 7.7* 7.6*  HCT 22.0*  --   --   MCV 91.7  --   --   MCH 29.2  --   --   MCHC 31.8  --   --   RDW 13.2  --   --   PLT 259  --   --     BNPNo results for input(s): BNP, PROBNP in the last 168 hours.   DDimer No results for input(s): DDIMER in the last 168 hours.   Radiology    No results found.   Cardiac Studies   2D Echo 06/29/2019: 1. The left ventricle has moderate-severely reduced systolic function, with an ejection fraction of 30-35%. The cavity size was normal. There is mildly increased left ventricular wall thickness. Left ventricular diastolic Doppler parameters are  consistent with pseudonormalization. Left ventricular diffuse hypokinesis. 2. The right ventricle has normal systolic function. The cavity was normal. There is no increase in right ventricular wall thickness. Unable to estimate RVSP. 3. Left atrial size was moderately dilated. __________  Elwyn Reach 10/2018: Normal sinus rhythm avg HR of 81 bpm.  8 Ventricular Tachycardia runs occurred, the run with the fastest interval lasting 4 beats with a max rate of 200 bpm,  the longest lasting 17 beats with an avg rate of 120 bpm.   7 Supraventricular Tachycardia runs occurred,  the run with the fastest interval lasting 5 beats with a max rate of 148 bpm,  the longest lasting 13 beats with an avg rate of 110 bpm.   Isolated SVEs were rare (<1.0%), SVE Couplets were rare (<1.0%), and SVE Triplets were rare (<1.0%).  Isolated VEs were rare (<1.0%), VE Couplets were rare (<1.0%), and no VE Triplets were present. Ventricular Trigeminy was present. __________  Myoview 09/2018: Pharmacological myocardial perfusion imaging study with large region of fixed inferior and inferolateral wall perfusion defect consistent with previous MI Very mild ischemia in the inferolateral wall (peri-infarct ischemia) Hypokinesis noted in the inferior wall. EF estimated at 36% No EKG changes concerning for ischemia at peak stress or in recovery. Moderate risk scan Similar findings on the study noted in 02/2018 and several years ago.  Patient Profile     58 y.o. male with a hx of CAD s/p four-vessel CABG (04/2014), HFrEF secondary to ICM, NSVT, paroxysmal SVT, PVD, poorly controlled insulin-dependent diabetes with diabetic foot ulcers and  diabetic peripheral neuropathyrequiring amputations of the left toes, osteomyelitis, CKD stage IV, hypertension, hyperlipidemia, asymptomatic orthostatic hypotension, medication compliance issues secondary to financial issues,andwho is being seen today for SOB at admission and recently diagnosed soft tissue hematoma.   Assessment & Plan    Chronic HFrEF 2/2 ICM  --Significant increase in weight with worsening renal failure. Patient noting increasing abdominal girth and mild LEE. Last clinic wt 110.1kg  currently 128.5kg. Volume overload likely cardiorenal in etiology with worsening renal function and in the setting of uncontrolled DM2.  --Echo as  above with EF 30-35%. Discussion with EP for ICD deferred at this time as previously noted given current spinal hematoma and multiple acute issues.  --Seen by nephrology with plan for vascular surgical consult / eval for permacath and initiation of HD tomorrow. Continue BB, hydralazine, nitrates including Imdur. No ACE/ARB/ARNI/MRA due to renal failure.  Soft tissue hematoma, anemia --Per IM  CAD s/p 4v CABG --No current CP. Extensive cardiac history as above. Enzymes peaked at 55. Flat trending and more consistent with supply demand ischemia in setting of CHF and worsening renal failure, EKG as above and without acute changes. Continue statin, BB, Imdur, SL nitro PRN. ASA was held due to hematoma and anemia.   HTN with hypertensive heart disease --Continue medical management.  HLD --Continue statin therapy.  CKDIV --Likely cardiorenal syndrome and worsening renal function in setting of poorly controlled DM2. Cr 3.555.19  4.87. BUN 107  97. Nephrology following.  DM2, poorly controlled --SSI, per IM.  PVD --L toes amputation.   H/o NSVT --Continue to monitor telemetry. --TSH 3.647. --Continue to monitor electrolytes.  Medication non-compliance --Compliance stressed.  For questions or updates, please contact [place]  Please consult [email or web address] for contact info under        Signed, Arvil Chaco, PA-C  08/04/2019, 11:28 AM

## 2019-08-04 NOTE — Progress Notes (Signed)
PT Cancellation Note  Patient Details Name: [name] [name] MRN: [id number] DOB: 27-Dec-1960   Cancelled Treatment:    Reason Eval/Treat Not Completed: Fatigue/lethargy limiting ability to participate.  Received message from pt's nurse regarding getting pt up to chair today.  Upon PT arrival to pt's room, pt resting in bed with wife present who reports pt will be going to dialysis soon.  Nurse reports pt can go in his bed for dialysis.  Pt's wife requesting that pt rest prior to dialysis (and reports pt did sit on edge of bed to eat his lunch).  Will re-attempt PT treatment session at a later date/time.  Leitha Bleak, PT 08/04/19, 2:20 PM [phone]

## 2019-08-04 NOTE — Progress Notes (Signed)
OT Cancellation Note  Patient Details Name: [name] [name] MRN: [id number] DOB: 11-15-1961   Cancelled Treatment:    Reason Eval/Treat Not Completed: Patient at procedure or test/ unavailable. Pt out of room upon attempt. No family present in room. Per chart review, pt out for dialysis. Will re-attempt OT tx at later date/time as pt is available and medically appropriate.   Jeni Salles, MPH, MS, OTR/L ascom [phone] 08/04/19, 4:10 PM

## 2019-08-04 NOTE — Progress Notes (Signed)
HD Tx completed    08/04/19 1730  Vital Signs  Pulse Rate 76  Resp 12  BP (!) 149/77  Oxygen Therapy  SpO2 97 %  O2 Device Room Air  During Hemodialysis Assessment  Blood Flow Rate (mL/min) 200 mL/min  Arterial Pressure (mmHg) -120 mmHg  Venous Pressure (mmHg) 60 mmHg  Transmembrane Pressure (mmHg) 40 mmHg  Ultrafiltration Rate (mL/min) 50 mL/min  Dialysate Flow Rate (mL/min) 300 ml/min  Conductivity: Machine  14  HD Safety Checks Performed Yes  Intra-Hemodialysis Comments Tx completed

## 2019-08-04 NOTE — Progress Notes (Signed)
[facility], [place] 08/04/19  Subjective:   LOS: 7 09/17 0701 - 09/18 0700 In: 480 [P.O.:480] Out: 2 [Urine:675]  Seen earlier today Patient's wife is at bedside Reports confusion was improved this morning  narcotics have been minimized Working with PT Continues to have LE edema Appetite is fair  permcath placed today    Objective:  Vital signs in last 24 hours:  Temp:  [97.9 F (36.6 C)-98.4 F (36.9 C)] 98.3 F (36.8 C) (09/18 1043) Pulse Rate:  [70-78] 72 (09/18 1043) Resp:  [10-20] 19 (09/18 1043) BP: (120-155)/(52-91) 134/60 (09/18 1043) SpO2:  [92 %-100 %] 92 % (09/18 1043) Weight:  [128.5 kg] 128.5 kg (09/18 0843)  Weight change:  Filed Weights   08/02/19 0502 08/04/19 0146 08/04/19 0843  Weight: 128 kg 128.5 kg 128.5 kg    Intake/Output:    Intake/Output Summary (Last 24 hours) at 08/04/2019 1514 Last data filed at 08/04/2019 1300 Gross per 24 hour  Intake 0 ml  Output 675 ml  Net -675 ml   Physical Exam: Gen:   Alert, cooperative, no distress, appears stated age Head:   Normocephalic, without obvious abnormality, atraumatic Eyes/ENT:  conjunctiva clear,  moist oral mucus membranes Neck:  Supple,   Lungs:   Clear to auscultation bilaterally, respirations unlabored Heart:   No rub  Abdomen:   Soft, non-tender  Extremities: no cyanosis , 1+ b/l edema in thighs and lower abdomen Skin:  Skin color, texture, turgor normal, no rashes or lesions Neurologic: Alert, able to answer questions today    Basic Metabolic Panel:  Recent Labs  Lab 07/31/19 0524 08/01/19 1026 08/02/19 0443 08/03/19 0445 08/04/19 0512  NA 133* 134* 134* 133* 136  K 4.9  4.8 4.1 4.4 4.6 4.3  CL 100 100 99 100 103  CO2 23 24 24 22 22   GLUCOSE 140* 197* 164* 286* 242*  BUN 74* 82* 91* 107* 97*  CREATININE 4.43* 4.59* 4.92* 5.19* 4.87*  CALCIUM 7.8* 8.1* 7.7* 7.6* 7.7*  MG 2.1  --   --   --   --   PHOS 6.2* 6.6* 6.8* 5.7* 5.6*      CBC: Recent Labs  Lab 07/29/19 0520 07/30/19 0634 08/02/19 0443  WBC 12.4*  --   --   HGB 7.0* 7.7* 7.6*  HCT 22.0*  --   --   MCV 91.7  --   --   PLT 259  --   --      No results found for: HEPBSAG, HEPBSAB, HEPBIGM    Microbiology:  Recent Results (from the past 240 hour(s))  SARS Coronavirus 2 [facility] order, Performed in [facility] hospital lab) Nasopharyngeal Nasopharyngeal Swab     Status: None   Collection Time: 07/27/19 10:36 PM   Specimen: Nasopharyngeal Swab  Result Value Ref Range Status   SARS Coronavirus 2 NEGATIVE NEGATIVE Final    Comment: (NOTE) If result is NEGATIVE SARS-CoV-2 target nucleic acids are NOT DETECTED. The SARS-CoV-2 RNA is generally detectable in upper and lower  respiratory specimens during the acute phase of infection. The lowest  concentration of SARS-CoV-2 viral copies this assay can detect is 250  copies / mL. A negative result does not preclude SARS-CoV-2 infection  and should not be used as the sole basis for treatment or other  patient management decisions.  A negative result may occur with  improper specimen collection / handling, submission of specimen other  than nasopharyngeal swab, presence of viral mutation(s) within  the  areas targeted by this assay, and inadequate number of viral copies  (<250 copies / mL). A negative result must be combined with clinical  observations, patient history, and epidemiological information. If result is POSITIVE SARS-CoV-2 target nucleic acids are DETECTED. The SARS-CoV-2 RNA is generally detectable in upper and lower  respiratory specimens dur ing the acute phase of infection.  Positive  results are indicative of active infection with SARS-CoV-2.  Clinical  correlation with patient history and other diagnostic information is  necessary to determine patient infection status.  Positive results do  not rule out bacterial infection or co-infection with other viruses. If result is  PRESUMPTIVE POSTIVE SARS-CoV-2 nucleic acids MAY BE PRESENT.   A presumptive positive result was obtained on the submitted specimen  and confirmed on repeat testing.  While 2019 novel coronavirus  (SARS-CoV-2) nucleic acids may be present in the submitted sample  additional confirmatory testing may be necessary for epidemiological  and / or clinical management purposes  to differentiate between  SARS-CoV-2 and other Sarbecovirus currently known to infect humans.  If clinically indicated additional testing with an alternate test  methodology [phone]) is advised. The SARS-CoV-2 RNA is generally  detectable in upper and lower respiratory sp ecimens during the acute  phase of infection. The expected result is Negative. Fact Sheet for Patients:  StrictlyIdeas.no Fact Sheet for Healthcare Providers: BankingDealers.co.za This test is not yet approved or cleared by the [place] FDA and has been authorized for detection and/or diagnosis of SARS-CoV-2 by FDA under an Emergency Use Authorization (EUA).  This EUA will remain in effect (meaning this test can be used) for the duration of the COVID-19 declaration under Section 564(b)(1) of the Act, 21 U.S.C. section 360bbb-3(b)(1), unless the authorization is terminated or revoked sooner. Performed at [facility], [address] [place], [place] [address]   CULTURE, BLOOD (ROUTINE X 2) w Reflex to ID Panel     Status: None   Collection Time: 07/30/19  6:55 PM   Specimen: BLOOD  Result Value Ref Range Status   Specimen Description BLOOD RIGHT ANTECUBITAL  Final   Special Requests   Final    BOTTLES DRAWN AEROBIC AND ANAEROBIC Blood Culture adequate volume   Culture   Final    NO GROWTH 5 DAYS Performed at [facility], [address] [place], [place] [address]    Report Status 08/04/2019 FINAL  Final  CULTURE, BLOOD (ROUTINE X 2) w Reflex to ID Panel     Status: None    Collection Time: 07/30/19  6:55 PM   Specimen: BLOOD  Result Value Ref Range Status   Specimen Description BLOOD RIGHT HAND  Final   Special Requests   Final    BOTTLES DRAWN AEROBIC AND ANAEROBIC Blood Culture adequate volume   Culture   Final    NO GROWTH 5 DAYS Performed at [facility], [address] [place], [place] [address]    Report Status 08/04/2019 FINAL  Final  Aerobic/Anaerobic Culture (surgical/deep wound)     Status: None (Preliminary result)   Collection Time: 07/31/19 11:35 AM   Specimen: Wound; Tissue  Result Value Ref Range Status   Specimen Description   Final    WOUND RIGHT LATERAL L4 SPINE Performed at [facility] [address], [place], [place] [address]    Special Requests   Final    Normal Performed at [facility], [address] [place], [place] [address]  Gram Stain NO WBC SEEN NO ORGANISMS SEEN   Final   Culture   Final    NO GROWTH 3 DAYS NO ANAEROBES ISOLATED; CULTURE IN PROGRESS FOR 5 DAYS Performed at [facility], [place] [address], [place], [place] [address]    Report Status PENDING  Incomplete    Coagulation Studies: No results for input(s): LABPROT, INR in the last 72 hours.  Urinalysis: No results for input(s): COLORURINE, LABSPEC, PHURINE, GLUCOSEU, HGBUR, BILIRUBINUR, KETONESUR, PROTEINUR, UROBILINOGEN, NITRITE, LEUKOCYTESUR in the last 72 hours.  Invalid input(s): APPERANCEUR    Imaging: No results found.   Medications:   . sodium chloride Stopped (08/01/19 0106)   . atorvastatin  40 mg Oral QHS  . carvedilol  25 mg Oral BID WC  . Chlorhexidine Gluconate Cloth  6 each Topical Q0600  . EPINEPHrine  0.3 mg Intramuscular Once  . epoetin (EPOGEN/PROCRIT) injection  10,000 Units Subcutaneous Weekly  . famotidine  20 mg Oral QHS  . feeding supplement (NEPRO CARB STEADY)  237 mL Oral TID BM  . fentaNYL      . ferrous sulfate  325 mg Oral Daily  . gabapentin  100 mg Oral TID  . heparin       . hydrALAZINE  25 mg Oral BID  . insulin aspart  0-5 Units Subcutaneous QHS  . insulin aspart  0-9 Units Subcutaneous TID WC  . insulin aspart  2 Units Subcutaneous TID WC  . insulin detemir  6 Units Subcutaneous Daily  . isosorbide mononitrate  30 mg Oral Daily  . lidocaine  1 patch Transdermal Q24H  . midazolam      . multivitamin  1 tablet Oral QHS  . polyethylene glycol  17 g Oral Daily  . senna-docusate  1 tablet Oral BID  . sodium bicarbonate  1,300 mg Oral BID  . sodium chloride flush  10 mL Intravenous Q12H   sodium chloride, acetaminophen **OR** acetaminophen, heparin, HYDROmorphone (DILAUDID) injection, HYDROmorphone (DILAUDID) injection, magnesium hydroxide, nitroGLYCERIN, ondansetron **OR** ondansetron (ZOFRAN) IV, ondansetron (ZOFRAN) IV, oxyCODONE  Assessment/ Plan:  58 y.o. male with  hypertension, diabetes mellitus type II, diabetic neuropathy, systolic congestive heart failure, peripheral vascular disease status post left toe amputations, autonomic neuropathy, coronary artery disease status post CABG, history of osteomyelitis, who has been admitted to [facility] on 07/27/2019 for acute exacerbation of congestive heart failure.   Active Problems:   Acute on chronic combined systolic and diastolic CHF (congestive heart failure) (HCC)   Acute on chronic respiratory failure with hypoxemia (HCC)   Acute midline low back pain without sciatica   #.AKI on  CKD st 4 Recent Labs    08/01/19 1026 08/02/19 0443 08/03/19 0445 08/04/19 0512  CREATININE 4.59* 4.92* 5.19* 4.87*  Baseline Cr 2.42 from April 2020/ GFR 29. Cause of AKI is unclear at present, but likely cardiorenal & ATN from excessive diuresis  -  Trial of intermitted HD due to high BUN/Cr. Confusion throught to be uremia Start HD on Friday and start d/c planning for outpatient dialysis- patient lives in [place]- requesting [place] dialysis  # LE edema, Chronic sys CHF EF 30-35 % Daily standing weights We will be  able to start ARB early next week once he gets stable with HD  #. Anemia of CKD  Lab Results  Component Value Date   HGB 7.6 (L) 08/02/2019  discussed risks and benefits of starting EPO with patient and his wife Started on EPO. Monitor closely  #. SHPTH  No  results found for: PTH Lab Results  Component Value Date   PHOS 5.6 (H) 08/04/2019  monitor level   #. Diabetes type 2 with CKD Hemoglobin A1C (%)  Date Value  07/19/2013 12.2 (H)   Hgb A1c MFr Bld (%)  Date Value  07/28/2019 7.2 (H)   #  Back pain Surgical team is following along.  No indication for I&D   LOS: [place] 9/18/20203:14 [facility] [place], [place]

## 2019-08-04 NOTE — Plan of Care (Signed)
  Problem: Clinical Measurements: Goal: Ability to maintain clinical measurements within normal limits will improve Outcome: Progressing Goal: Will remain free from infection Outcome: Progressing Goal: Diagnostic test results will improve Outcome: Progressing Goal: Respiratory complications will improve Outcome: Progressing Goal: Cardiovascular complication will be avoided Outcome: Progressing  Pt is ready to receive HD Tx. Vitals WDL. Will continue monitor

## 2019-08-04 NOTE — Progress Notes (Signed)
Pre HD Tx assessment Pt arrived to HD Acute Room for tx. Pt is sleepy from Cath. Placement procedure, but easily oriented. Pt denies SOB or Chest pain. BP WDL. Catheter dressing looks intact no bleeding or drainage. Pt is ready to receive dialysis   08/04/19 1515  Neurological  Level of Consciousness Alert  Orientation Level Oriented X4 (just feeling lethargic from Cath placement easily Oriented)  Respiratory  Respiratory Pattern Regular;Unlabored  Bilateral Breath Sounds Clear;Diminished  Cardiac  Pulse Regular  Heart Sounds S1, S2  ECG Monitor No  Vascular  R Radial Pulse +2  L Radial Pulse +2  Gastrointestinal  Bowel Sounds Assessment Active  GU Assessment  Genitourinary (WDL) WDL  Psychosocial  Psychosocial (WDL) WDL

## 2019-08-04 NOTE — Progress Notes (Signed)
PT Cancellation Note  Patient Details Name: [name] [name] MRN: [id number] DOB: 06-20-1961   Cancelled Treatment:    Reason Eval/Treat Not Completed: Patient at procedure or test/unavailable.  Pt currently off unit for procedure.  Will re-attempt PT treatment session at a later date/time.  Leitha Bleak, PT 08/04/19, 9:06 AM [phone]

## 2019-08-05 LAB — RENAL FUNCTION PANEL
Albumin: 1.7 g/dL — ABNORMAL LOW (ref 3.5–5.0)
Anion gap: 10 (ref 5–15)
BUN: 82 mg/dL — ABNORMAL HIGH (ref 6–20)
CO2: 24 mmol/L (ref 22–32)
Calcium: 7.6 mg/dL — ABNORMAL LOW (ref 8.9–10.3)
Chloride: 104 mmol/L (ref 98–111)
Creatinine, Ser: 3.73 mg/dL — ABNORMAL HIGH (ref 0.61–1.24)
GFR calc Af Amer: 20 mL/min — ABNORMAL LOW (ref >60)
GFR calc non Af Amer: 17 mL/min — ABNORMAL LOW (ref >60)
Glucose, Bld: 240 mg/dL — ABNORMAL HIGH (ref 70–99)
Phosphorus: 5.3 mg/dL — ABNORMAL HIGH (ref 2.5–4.6)
Potassium: 4.3 mmol/L (ref 3.5–5.1)
Sodium: 138 mmol/L (ref 135–145)

## 2019-08-05 LAB — AEROBIC/ANAEROBIC CULTURE W GRAM STAIN (SURGICAL/DEEP WOUND)
Culture: NO GROWTH
Gram Stain: NONE SEEN
Special Requests: NORMAL

## 2019-08-05 LAB — HEPATITIS B SURFACE ANTIGEN: Hepatitis B Surface Ag: NEGATIVE

## 2019-08-05 LAB — GLUCOSE, CAPILLARY
Glucose-Capillary: 242 mg/dL — ABNORMAL HIGH (ref 70–99)
Glucose-Capillary: 198 mg/dL — ABNORMAL HIGH (ref 70–99)
Glucose-Capillary: 354 mg/dL — ABNORMAL HIGH (ref 70–99)
Glucose-Capillary: 386 mg/dL — ABNORMAL HIGH (ref 70–99)

## 2019-08-05 LAB — HEPATITIS B SURFACE ANTIBODY,QUALITATIVE: Hep B S Ab: NONREACTIVE

## 2019-08-05 LAB — HEPATITIS B CORE ANTIBODY, TOTAL: Hep B Core Total Ab: NEGATIVE

## 2019-08-05 LAB — HEPATITIS C ANTIBODY: HCV Ab: <0.1 s/co ratio (ref 0.0–0.9)

## 2019-08-05 MED ORDER — TORSEMIDE 20 MG PO TABS
40.0000 mg | ORAL_TABLET | Freq: Every day | ORAL | Status: DC
  Administered 2019-08-05 – 2019-08-08 (×4): 40 mg via ORAL
  Filled 2019-08-05 (×4): qty 2

## 2019-08-05 NOTE — Progress Notes (Signed)
This note also relates to the following rows which could not be included: Pulse Rate - Cannot attach notes to unvalidated device data Resp - Cannot attach notes to unvalidated device data BP - Cannot attach notes to unvalidated device data  Hd started  

## 2019-08-05 NOTE — Progress Notes (Signed)
This note also relates to the following rows which could not be included: Pulse Rate - Cannot attach notes to unvalidated device data Resp - Cannot attach notes to unvalidated device data  Hd completed  

## 2019-08-05 NOTE — Progress Notes (Signed)
Patient returned to the floor from dialysis, 500cc removed as per dialysis nurse, pateint tolerated the tx

## 2019-08-05 NOTE — Progress Notes (Signed)
[place] [facility] [place] NAME: [name] [name]    MR#:  [id number]  DATE OF BIRTH:  1961/05/02  SUBJECTIVE:   Patient seen at dialysis. Denies any complaints. REVIEW OF SYSTEMS:   Review of Systems  Constitutional: Negative for chills, fever and weight loss.  HENT: Negative for ear discharge, ear pain and nosebleeds.   Eyes: Negative for blurred vision, pain and discharge.  Respiratory: Positive for shortness of breath. Negative for sputum production, wheezing and stridor.   Cardiovascular: Positive for leg swelling. Negative for chest pain, palpitations, orthopnea and PND.  Gastrointestinal: Negative for abdominal pain, diarrhea, nausea and vomiting.  Genitourinary: Negative for frequency and urgency.  Musculoskeletal: Positive for back pain. Negative for joint pain.  Neurological: Positive for weakness. Negative for sensory change, speech change and focal weakness.  Psychiatric/Behavioral: Negative for depression and hallucinations. The patient is not nervous/anxious.    Tolerating Diet:yes Tolerating PT: home health  DRUG ALLERGIES:   Allergies  Allergen Reactions  . 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 Doesn't believe he has an allergy to PCN as he has taken it previously, according to wife.  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 Doesn't believe he has an allergy to PCN as he has taken it previously, according to wife.    VITALS:  Blood pressure (!) 152/68, pulse 78, temperature 98.6 F (37 C), temperature source Oral, resp. rate 10, height 6\' 1"  (1.854 m), weight 127.9 kg, SpO2 96 %.  PHYSICAL EXAMINATION:   Physical Exam  GENERAL:  58 y.o.-year-old patient lying in the bed with no acute distress. He is obese, chronically ill EYES: Pupils equal, round, reactive to light and  accommodation. No scleral icterus. Extraocular muscles intact.  HEENT: Head atraumatic, normocephalic. Oropharynx and nasopharynx clear.  NECK:  Supple, no jugular venous distention. No thyroid enlargement, no tenderness.  LUNGS: decreased breath sounds bilaterally, no wheezing, rales, rhonchi. No use of accessory muscles of respiration. Right sided perm catheter present CARDIOVASCULAR: S1, S2 normal. No murmurs, rubs, or gallops.  ABDOMEN: Soft, nontender, nondistended. Bowel sounds present. No organomegaly or mass.  EXTREMITIES: No cyanosis, clubbing  + edema b/l.    NEUROLOGIC: Cranial nerves II through XII are intact. No focal Motor or sensory deficits b/l. Some tenderness in the lower back. PSYCHIATRIC:  patient is alert and oriented x 3.  SKIN: mild redness in the lumbar area marked  LABORATORY PANEL:  CBC Recent Labs  Lab 08/02/19 0443  HGB 7.6*    Chemistries  Recent Labs  Lab 07/31/19 0524  08/05/19 0511  NA 133*   < > 138  K 4.9  4.8   < > 4.3  CL 100   < > 104  CO2 23   < > 24  GLUCOSE 140*   < > 240*  BUN 74*   < > 82*  CREATININE 4.43*   < > 3.73*  CALCIUM 7.8*   < > 7.6*  MG 2.1  --   --    < > = values in this interval not displayed.   Cardiac Enzymes No results for input(s): TROPONINI in the last 168 hours. RADIOLOGY:  No results found. ASSESSMENT AND PLAN:  Demar Shad Murphyis a 58 y.o.malewith a hx of CAD s/p four-vessel CABG (04/2014), HFrEF secondary to ICM, NSVT, paroxysmal SVT, PVD, poorly controlled insulin-dependent diabetes with diabetic foot ulcers  and diabetic peripheral neuropathyrequiring amputations of the left toes, osteomyelitis, CKD stage IV, hypertension, hyperlipidemia  1. Acute on chronic systolic dysfunction with history of ischemic cardiomyopathy -was on IV Lasix 40 mg BID--discontinued ---stop lasix due to rising creat --UOP 9.2 liters. Clinically looks better. sats 95% on RA -monitor input output, creatinine  3.45--4.43--4.61--4.92--5.1--3.7 -appreciate cardiology and nephrology input -low sodium diet -patient has history of coronary artery disease status post CABG  2. Low back pain acute --MRI of the lumbar spine shows Large round 7 centimeter nonspecific soft tissue area  in the subcutaneous fat overlying the L4 vertebra to the right of midline.-This could be an infectious phlegmon or a hematoma.--more favoring hematoma  per neurosx-status post ultrasound guided drain of bloody fluid by IR on September 14th-- fluid culture negative  3. Acute on chronic kidney disease stage IV/with encephalopathy uremic mental status changes -avoid nephrotoxic ends -continue PO bicarb -hold lasix -creat rising 4.6--4.92--5.1-- 3.7 -making good urine -now on hemodialysis day two  4. Acute on chronic anemia secondary to kidney disease, questionable hematoma in the back -will transfuse one unit today -came in with hemoglobin of 9.2--- 7.0-- one unit blood transfusion---7.7--7.6 -SCD -hold aspirin  5. Type II diabetes sliding scale insulin  6. Hyperlipidemia continue statins  Spoke with Mrs. [name] and updated today.  Management plans discussed with the patient, family and they are in agreement.  CODE STATUS: full  DVT Prophylaxis: SCD  TOTAL TIME TAKING CARE OF THIS PATIENT: *30* minutes.  >50% time spent on counselling and coordination of care  POSSIBLE D/C IN *?* DAYS, DEPENDING ON CLINICAL CONDITION.  Note: This dictation was prepared with Dragon dictation along with smaller phrase technology. Any transcriptional errors that result from this process are unintentional.  Fritzi Mandes M.D on 08/05/2019 at 12:37 PM  Between 7am to 6pm - Pager - [phone]  After 6pm go to [email or web address] - password [email or web address] [place] [facility]  Office  [phone]  CC: Primary care physician; Tracie Harrier, MDPatient ID: [name], male   DOB: 12/17/1960, 58 y.o.   MRN: [id number]

## 2019-08-05 NOTE — Progress Notes (Signed)
[facility], [place] 08/05/19  Subjective:   LOS: 8 09/18 0701 - 09/19 0700 In: 0  Out: 1478 [[id number]]  Seen during dialysis.  Tolerating well Urine output appears to be improving Eating better No nausea or vomiting No shortness of breath Continues to have lower extremity edema    Objective:  Vital signs in last 24 hours:  Temp:  [98 F (36.7 C)-98.6 F (37 C)] 98 F (36.7 C) (09/19 1254) Pulse Rate:  [72-80] 80 (09/19 1254) Resp:  [0-27] 10 (09/19 1232) BP: (82-178)/(50-137) 152/71 (09/19 1254) SpO2:  [92 %-98 %] 98 % (09/19 1254) Weight:  [127.9 kg] 127.9 kg (09/19 0347)  Weight change: -0.049 kg Filed Weights   08/04/19 0146 08/04/19 0843 08/05/19 0347  Weight: 128.5 kg 128.5 kg 127.9 kg    Intake/Output:    Intake/Output Summary (Last 24 hours) at 08/05/2019 1302 Last data filed at 08/05/2019 1228 Gross per 24 hour  Intake -  Output 3000 ml  Net -3000 ml   Physical Exam: Gen:   Alert, cooperative, no distress, appears stated age Head:   Normocephalic, without obvious abnormality, atraumatic Eyes/ENT:  conjunctiva clear,  moist oral mucus membranes Neck:  Supple,   Lungs:   Clear to auscultation bilaterally, respirations unlabored Heart:   No rub  Abdomen:   Soft, non-tender  Extremities: no cyanosis , 1+ b/l edema in thighs and lower abdomen Skin:  Skin color, texture, turgor normal, no rashes or lesions Neurologic: Alert, able to answer questions today    Basic Metabolic Panel:  Recent Labs  Lab 07/31/19 0524 08/01/19 1026 08/02/19 0443 08/03/19 0445 08/04/19 0512 08/05/19 0511  NA 133* 134* 134* 133* 136 138  K 4.9  4.8 4.1 4.4 4.6 4.3 4.3  CL 100 100 99 100 103 104  CO2 23 24 24 22 22 24   GLUCOSE 140* 197* 164* 286* 242* 240*  BUN 74* 82* 91* 107* 97* 82*  CREATININE 4.43* 4.59* 4.92* 5.19* 4.87* 3.73*  CALCIUM 7.8* 8.1* 7.7* 7.6* 7.7* 7.6*  MG 2.1  --   --   --   --   --   PHOS 6.2* 6.6* 6.8* 5.7*  5.6* 5.3*     CBC: Recent Labs  Lab 07/30/19 0634 08/02/19 0443  HGB 7.7* 7.6*      Lab Results  Component Value Date   HEPBSAG Negative 08/04/2019   HEPBSAB Non Reactive 08/04/2019      Microbiology:  Recent Results (from the past 240 hour(s))  SARS Coronavirus 2 [facility] order, Performed in [facility] hospital lab) Nasopharyngeal Nasopharyngeal Swab     Status: None   Collection Time: 07/27/19 10:36 PM   Specimen: Nasopharyngeal Swab  Result Value Ref Range Status   SARS Coronavirus 2 NEGATIVE NEGATIVE Final    Comment: (NOTE) If result is NEGATIVE SARS-CoV-2 target nucleic acids are NOT DETECTED. The SARS-CoV-2 RNA is generally detectable in upper and lower  respiratory specimens during the acute phase of infection. The lowest  concentration of SARS-CoV-2 viral copies this assay can detect is 250  copies / mL. A negative result does not preclude SARS-CoV-2 infection  and should not be used as the sole basis for treatment or other  patient management decisions.  A negative result may occur with  improper specimen collection / handling, submission of specimen other  than nasopharyngeal swab, presence of viral mutation(s) within the  areas targeted by this assay, and inadequate number of viral copies  (<250 copies / mL).  A negative result must be combined with clinical  observations, patient history, and epidemiological information. If result is POSITIVE SARS-CoV-2 target nucleic acids are DETECTED. The SARS-CoV-2 RNA is generally detectable in upper and lower  respiratory specimens dur ing the acute phase of infection.  Positive  results are indicative of active infection with SARS-CoV-2.  Clinical  correlation with patient history and other diagnostic information is  necessary to determine patient infection status.  Positive results do  not rule out bacterial infection or co-infection with other viruses. If result is PRESUMPTIVE POSTIVE SARS-CoV-2 nucleic  acids MAY BE PRESENT.   A presumptive positive result was obtained on the submitted specimen  and confirmed on repeat testing.  While 2019 novel coronavirus  (SARS-CoV-2) nucleic acids may be present in the submitted sample  additional confirmatory testing may be necessary for epidemiological  and / or clinical management purposes  to differentiate between  SARS-CoV-2 and other Sarbecovirus currently known to infect humans.  If clinically indicated additional testing with an alternate test  methodology [id number]) is advised. The SARS-CoV-2 RNA is generally  detectable in upper and lower respiratory sp ecimens during the acute  phase of infection. The expected result is Negative. Fact Sheet for Patients:  StrictlyIdeas.no Fact Sheet for Healthcare Providers: BankingDealers.co.za This test is not yet approved or cleared by the [place] FDA and has been authorized for detection and/or diagnosis of SARS-CoV-2 by FDA under an Emergency Use Authorization (EUA).  This EUA will remain in effect (meaning this test can be used) for the duration of the COVID-19 declaration under Section 564(b)(1) of the Act, 21 U.S.C. section 360bbb-3(b)(1), unless the authorization is terminated or revoked sooner. Performed at [facility], [address] [place], [place] [address]   CULTURE, BLOOD (ROUTINE X 2) w Reflex to ID Panel     Status: None   Collection Time: 07/30/19  6:55 PM   Specimen: BLOOD  Result Value Ref Range Status   Specimen Description BLOOD RIGHT ANTECUBITAL  Final   Special Requests   Final    BOTTLES DRAWN AEROBIC AND ANAEROBIC Blood Culture adequate volume   Culture   Final    NO GROWTH 5 DAYS Performed at [facility], [address] [place], [place] [address]    Report Status 08/04/2019 FINAL  Final  CULTURE, BLOOD (ROUTINE X 2) w Reflex to ID Panel     Status: None   Collection Time: 07/30/19  6:55 PM    Specimen: BLOOD  Result Value Ref Range Status   Specimen Description BLOOD RIGHT HAND  Final   Special Requests   Final    BOTTLES DRAWN AEROBIC AND ANAEROBIC Blood Culture adequate volume   Culture   Final    NO GROWTH 5 DAYS Performed at [facility], [address], [place], [place] [address]    Report Status 08/04/2019 FINAL  Final  Aerobic/Anaerobic Culture (surgical/deep wound)     Status: None (Preliminary result)   Collection Time: 07/31/19 11:35 AM   Specimen: Wound; Tissue  Result Value Ref Range Status   Specimen Description   Final    WOUND RIGHT LATERAL L4 SPINE Performed at [facility] [address] [place], [place] [address]    Special Requests   Final    Normal Performed at [facility], [place], [place] [address]    Gram Stain NO WBC SEEN NO ORGANISMS SEEN   Final   Culture   Final  NO GROWTH 4 DAYS NO ANAEROBES ISOLATED; CULTURE IN PROGRESS FOR 5 DAYS Performed at [facility], [place] [address] [place], [place] [address]    Report Status PENDING  Incomplete    Coagulation Studies: No results for input(s): LABPROT, INR in the last 72 hours.  Urinalysis: No results for input(s): COLORURINE, LABSPEC, PHURINE, GLUCOSEU, HGBUR, BILIRUBINUR, KETONESUR, PROTEINUR, UROBILINOGEN, NITRITE, LEUKOCYTESUR in the last 72 hours.  Invalid input(s): APPERANCEUR    Imaging: No results found.   Medications:   . sodium chloride Stopped (08/01/19 0106)   . atorvastatin  40 mg Oral QHS  . carvedilol  25 mg Oral BID WC  . Chlorhexidine Gluconate Cloth  6 each Topical Q0600  . EPINEPHrine  0.3 mg Intramuscular Once  . epoetin (EPOGEN/PROCRIT) injection  10,000 Units Subcutaneous Weekly  . famotidine  20 mg Oral QHS  . feeding supplement (NEPRO CARB STEADY)  237 mL Oral TID BM  . ferrous sulfate  325 mg Oral Daily  . gabapentin  100 mg Oral TID  . hydrALAZINE  25 mg Oral BID  . insulin aspart  0-5 Units  Subcutaneous QHS  . insulin aspart  0-9 Units Subcutaneous TID WC  . insulin aspart  2 Units Subcutaneous TID WC  . insulin detemir  6 Units Subcutaneous Daily  . isosorbide mononitrate  30 mg Oral Daily  . lidocaine  1 patch Transdermal Q24H  . multivitamin  1 tablet Oral QHS  . polyethylene glycol  17 g Oral Daily  . senna-docusate  1 tablet Oral BID  . sodium bicarbonate  1,300 mg Oral BID  . sodium chloride flush  10 mL Intravenous Q12H   sodium chloride, acetaminophen **OR** acetaminophen, heparin, HYDROmorphone (DILAUDID) injection, HYDROmorphone (DILAUDID) injection, magnesium hydroxide, nitroGLYCERIN, ondansetron **OR** ondansetron (ZOFRAN) IV, ondansetron (ZOFRAN) IV, oxyCODONE  Assessment/ Plan:  58 y.o. male with  hypertension, diabetes mellitus type II, diabetic neuropathy, systolic congestive heart failure, peripheral vascular disease status post left toe amputations, autonomic neuropathy, coronary artery disease status post CABG, history of osteomyelitis, who has been admitted to [facility] on 07/27/2019 for acute exacerbation of congestive heart failure.   Active Problems:   Acute on chronic combined systolic and diastolic CHF (congestive heart failure) (HCC)   Acute on chronic respiratory failure with hypoxemia (HCC)   Acute midline low back pain without sciatica   #.AKI on  CKD st 4 Recent Labs    08/02/19 0443 08/03/19 0445 08/04/19 0512 08/05/19 0511  CREATININE 4.92* 5.19* 4.87* 3.73*  Baseline Cr 2.42 from April 2020/ GFR 29. Cause of AKI is unclear at present, but likely cardiorenal & ATN from excessive diuresis  -  Trial of intermitted HD due to high BUN/Cr. Confusion throught to be uremia Start HD on Friday and start d/c planning for outpatient dialysis- patient lives in [place]- requesting [place] dialysis Urine output of 1350 cc yesterday.  650 cc today   HEMODIALYSIS FLOWSHEET:  Blood Flow Rate (mL/min): 250 mL/min Arterial Pressure (mmHg): -90 mmHg Venous  Pressure (mmHg): 70 mmHg Transmembrane Pressure (mmHg): 50 mmHg Ultrafiltration Rate (mL/min): 400 mL/min Dialysate Flow Rate (mL/min): 500 ml/min Conductivity: Machine : 14.2 Conductivity: Machine : 14.2 Dialysis Fluid Bolus: Normal Saline Bolus Amount (mL): 250 mL    # LE edema, Chronic sys CHF EF 30-35 % Daily standing weights We will be able to start ARB early next week once he gets stable with HD Restart Diuretic  #. Anemia of CKD  Lab Results  Component Value Date  HGB 7.6 (L) 08/02/2019    Started on SQ EPO. Monitor closely  #. SHPTH  No results found for: PTH Lab Results  Component Value Date   PHOS 5.3 (H) 08/05/2019  monitor level   #. Diabetes type 2 with CKD Hemoglobin A1C (%)  Date Value  07/19/2013 12.2 (H)   Hgb A1c MFr Bld (%)  Date Value  07/28/2019 7.2 (H)   #  Back pain Surgical team is following along.  No indication for I&D   LOS: 8 Nelissa Bolduc 9/19/20201:02 PM  [facility] [place], [place]

## 2019-08-05 NOTE — Plan of Care (Signed)
  Problem: Education: Goal: Knowledge of General Education information will improve Description Including pain rating scale, medication(s)/side effects and non-pharmacologic comfort measures Outcome: Progressing   Problem: Clinical Measurements: Goal: Ability to maintain clinical measurements within normal limits will improve Outcome: Progressing   Problem: Clinical Measurements: Goal: Will remain free from infection Outcome: Progressing   

## 2019-08-05 NOTE — Progress Notes (Signed)
Patient off the floor to dialysis , report given to dialysis nurse .

## 2019-08-06 LAB — GLUCOSE, CAPILLARY
Glucose-Capillary: 216 mg/dL — ABNORMAL HIGH (ref 70–99)
Glucose-Capillary: 325 mg/dL — ABNORMAL HIGH (ref 70–99)
Glucose-Capillary: 276 mg/dL — ABNORMAL HIGH (ref 70–99)
Glucose-Capillary: 268 mg/dL — ABNORMAL HIGH (ref 70–99)

## 2019-08-06 NOTE — Progress Notes (Signed)
[facility], [place] 08/06/19  Subjective:   LOS: 9 09/19 0701 - 09/20 0700 In: -  Out: 1150 [Urine:650]  Doing well today.  More alert.  Dates he will try to walk today Has tolerated dialysis well so far Urine output appears to be improving Eating better No nausea or vomiting No shortness of breath Continues to have lower extremity edema    Objective:  Vital signs in last 24 hours:  Temp:  [98 F (36.7 C)-98.8 F (37.1 C)] 98 F (36.7 C) (09/20 0738) Pulse Rate:  [72-80] 72 (09/20 0738) Resp:  [10-20] 18 (09/20 0738) BP: (139-168)/(59-78) 168/75 (09/20 0738) SpO2:  [93 %-98 %] 93 % (09/20 0738) Weight:  [125.7 kg] 125.7 kg (09/20 0359)  Weight change: -2.763 kg Filed Weights   08/04/19 0843 08/05/19 0347 08/06/19 0359  Weight: 128.5 kg 127.9 kg 125.7 kg    Intake/Output:    Intake/Output Summary (Last 24 hours) at 08/06/2019 1213 Last data filed at 08/06/2019 1011 Gross per 24 hour  Intake -  Output 1000 ml  Net -1000 ml   Physical Exam: Gen:   Alert, cooperative, no distress, appears stated age Head:   Normocephalic, without obvious abnormality, atraumatic Eyes/ENT:  conjunctiva clear,  moist oral mucus membranes Neck:  Supple,   Lungs:   Clear to auscultation bilaterally, respirations unlabored Heart:   No rub  Abdomen:   Soft, non-tender  Extremities: no cyanosis , 1+ b/l edema in thighs and lower abdomen Skin:  Skin color, texture, turgor normal, no rashes or lesions Neurologic: Alert, able to answer questions today    Basic Metabolic Panel:  Recent Labs  Lab 07/31/19 0524 08/01/19 1026 08/02/19 0443 08/03/19 0445 08/04/19 0512 08/05/19 0511  NA 133* 134* 134* 133* 136 138  K 4.9  4.8 4.1 4.4 4.6 4.3 4.3  CL 100 100 99 100 103 104  CO2 23 24 24 22 22 24   GLUCOSE 140* 197* 164* 286* 242* 240*  BUN 74* 82* 91* 107* 97* 82*  CREATININE 4.43* 4.59* 4.92* 5.19* 4.87* 3.73*  CALCIUM 7.8* 8.1* 7.7* 7.6* 7.7* 7.6*   MG 2.1  --   --   --   --   --   PHOS 6.2* 6.6* 6.8* 5.7* 5.6* 5.3*     CBC: Recent Labs  Lab 08/02/19 0443  HGB 7.6*      Lab Results  Component Value Date   HEPBSAG Negative 08/04/2019   HEPBSAB Non Reactive 08/04/2019      Microbiology:  Recent Results (from the past 240 hour(s))  SARS Coronavirus 2 [facility] order, Performed in [facility] hospital lab) Nasopharyngeal Nasopharyngeal Swab     Status: None   Collection Time: 07/27/19 10:36 PM   Specimen: Nasopharyngeal Swab  Result Value Ref Range Status   SARS Coronavirus 2 NEGATIVE NEGATIVE Final    Comment: (NOTE) If result is NEGATIVE SARS-CoV-2 target nucleic acids are NOT DETECTED. The SARS-CoV-2 RNA is generally detectable in upper and lower  respiratory specimens during the acute phase of infection. The lowest  concentration of SARS-CoV-2 viral copies this assay can detect is 250  copies / mL. A negative result does not preclude SARS-CoV-2 infection  and should not be used as the sole basis for treatment or other  patient management decisions.  A negative result may occur with  improper specimen collection / handling, submission of specimen other  than nasopharyngeal swab, presence of viral mutation(s) within the  areas targeted by this assay,  and inadequate number of viral copies  (<250 copies / mL). A negative result must be combined with clinical  observations, patient history, and epidemiological information. If result is POSITIVE SARS-CoV-2 target nucleic acids are DETECTED. The SARS-CoV-2 RNA is generally detectable in upper and lower  respiratory specimens dur ing the acute phase of infection.  Positive  results are indicative of active infection with SARS-CoV-2.  Clinical  correlation with patient history and other diagnostic information is  necessary to determine patient infection status.  Positive results do  not rule out bacterial infection or co-infection with other viruses. If result is  PRESUMPTIVE POSTIVE SARS-CoV-2 nucleic acids MAY BE PRESENT.   A presumptive positive result was obtained on the submitted specimen  and confirmed on repeat testing.  While 2019 novel coronavirus  (SARS-CoV-2) nucleic acids may be present in the submitted sample  additional confirmatory testing may be necessary for epidemiological  and / or clinical management purposes  to differentiate between  SARS-CoV-2 and other Sarbecovirus currently known to infect humans.  If clinically indicated additional testing with an alternate test  methodology [phone]) is advised. The SARS-CoV-2 RNA is generally  detectable in upper and lower respiratory sp ecimens during the acute  phase of infection. The expected result is Negative. Fact Sheet for Patients:  StrictlyIdeas.no Fact Sheet for Healthcare Providers: BankingDealers.co.za This test is not yet approved or cleared by the [place] FDA and has been authorized for detection and/or diagnosis of SARS-CoV-2 by FDA under an Emergency Use Authorization (EUA).  This EUA will remain in effect (meaning this test can be used) for the duration of the COVID-19 declaration under Section 564(b)(1) of the Act, 21 U.S.C. section 360bbb-3(b)(1), unless the authorization is terminated or revoked sooner. Performed at [facility] [address] [place], [place] [address]   CULTURE, BLOOD (ROUTINE X 2) w Reflex to ID Panel     Status: None   Collection Time: 07/30/19  6:55 PM   Specimen: BLOOD  Result Value Ref Range Status   Specimen Description BLOOD RIGHT ANTECUBITAL  Final   Special Requests   Final    BOTTLES DRAWN AEROBIC AND ANAEROBIC Blood Culture adequate volume   Culture   Final    NO GROWTH 5 DAYS Performed at [facility], [address] [place], [place] [address]    Report Status 08/04/2019 FINAL  Final  CULTURE, BLOOD (ROUTINE X 2) w Reflex to ID Panel     Status: None    Collection Time: 07/30/19  6:55 PM   Specimen: BLOOD  Result Value Ref Range Status   Specimen Description BLOOD RIGHT HAND  Final   Special Requests   Final    BOTTLES DRAWN AEROBIC AND ANAEROBIC Blood Culture adequate volume   Culture   Final    NO GROWTH 5 DAYS Performed at [facility], [address] [place], [place] [address]    Report Status 08/04/2019 FINAL  Final  Aerobic/Anaerobic Culture (surgical/deep wound)     Status: None   Collection Time: 07/31/19 11:35 AM   Specimen: Wound; Tissue  Result Value Ref Range Status   Specimen Description   Final    WOUND RIGHT LATERAL L4 SPINE Performed at [facility] [address], [place], [place] [address]    Special Requests   Final    Normal Performed at [place], [place] [address]    Gram Stain NO WBC SEEN NO ORGANISMS SEEN  Final   Culture   Final    No growth aerobically or anaerobically. Performed at [facility], [place] [address], [place], [place] [address]    Report Status 08/05/2019 FINAL  Final    Coagulation Studies: No results for input(s): LABPROT, INR in the last 72 hours.  Urinalysis: No results for input(s): COLORURINE, LABSPEC, PHURINE, GLUCOSEU, HGBUR, BILIRUBINUR, KETONESUR, PROTEINUR, UROBILINOGEN, NITRITE, LEUKOCYTESUR in the last 72 hours.  Invalid input(s): APPERANCEUR    Imaging: No results found.   Medications:   . sodium chloride Stopped (08/01/19 0106)   . atorvastatin  40 mg Oral QHS  . carvedilol  25 mg Oral BID WC  . Chlorhexidine Gluconate Cloth  6 each Topical Q0600  . EPINEPHrine  0.3 mg Intramuscular Once  . epoetin (EPOGEN/PROCRIT) injection  10,000 Units Subcutaneous Weekly  . famotidine  20 mg Oral QHS  . feeding supplement (NEPRO CARB STEADY)  237 mL Oral TID BM  . ferrous sulfate  325 mg Oral Daily  . gabapentin  100 mg Oral TID  . hydrALAZINE  25 mg Oral BID  . insulin aspart  0-5 Units Subcutaneous QHS   . insulin aspart  0-9 Units Subcutaneous TID WC  . insulin aspart  2 Units Subcutaneous TID WC  . insulin detemir  6 Units Subcutaneous Daily  . isosorbide mononitrate  30 mg Oral Daily  . lidocaine  1 patch Transdermal Q24H  . multivitamin  1 tablet Oral QHS  . polyethylene glycol  17 g Oral Daily  . senna-docusate  1 tablet Oral BID  . sodium bicarbonate  1,300 mg Oral BID  . sodium chloride flush  10 mL Intravenous Q12H  . torsemide  40 mg Oral Daily   sodium chloride, acetaminophen **OR** acetaminophen, heparin, HYDROmorphone (DILAUDID) injection, HYDROmorphone (DILAUDID) injection, magnesium hydroxide, nitroGLYCERIN, ondansetron **OR** ondansetron (ZOFRAN) IV, ondansetron (ZOFRAN) IV, oxyCODONE  Assessment/ Plan:  58 y.o. male with  hypertension, diabetes mellitus type II, diabetic neuropathy, systolic congestive heart failure, peripheral vascular disease status post left toe amputations, autonomic neuropathy, coronary artery disease status post CABG, history of osteomyelitis, who has been admitted to [facility] on 07/27/2019 for acute exacerbation of congestive heart failure.   Active Problems:   Acute on chronic combined systolic and diastolic CHF (congestive heart failure) (HCC)   Acute on chronic respiratory failure with hypoxemia (HCC)   Acute midline low back pain without sciatica   #.AKI on  CKD st 4 Recent Labs    08/02/19 0443 08/03/19 0445 08/04/19 0512 08/05/19 0511  CREATININE 4.92* 5.19* 4.87* 3.73*  Baseline Cr 2.42 from April 2020/ GFR 29. Cause of AKI is unclear at present, but likely cardiorenal & ATN from excessive diuresis  -  Trial of intermitted HD due to high BUN/Cr. Confusion throught to be uremia Started HD on Friday.  Second treatment on Saturday - d/c planning for outpatient dialysis- patient lives in [place]- requesting [facility] dialysis -Possibly may need 1 or 2 dialysis treatments and then we will reevaluate with 24-hour urine for creatinine clearance as  outpatient    # LE edema, Chronic sys CHF EF 30-35 % Daily standing weights ACE inhibitor or ARB once renal function stabilizes Continue diuretic  #. Anemia of CKD  Lab Results  Component Value Date   HGB 7.6 (L) 08/02/2019    Started on SQ EPO. Monitor closely  #. SHPTH  No results found for: PTH Lab Results  Component Value Date   PHOS 5.3 (H) 08/05/2019  monitor  level   #. Diabetes type 2 with CKD Hemoglobin A1C (%)  Date Value  07/19/2013 12.2 (H)   Hgb A1c MFr Bld (%)  Date Value  07/28/2019 7.2 (H)   #  Back pain Surgical team is following along.  No indication for I&D   LOS: [place] 9/20/202012:13 [place], [place]

## 2019-08-06 NOTE — Progress Notes (Signed)
[facility] [place] NAME: [name] [name]    MR#:  [id number]  DATE OF BIRTH:  [birth year]  SUBJECTIVE:   Patient doing well. Daughter at the bedside. No new complaints. REVIEW OF SYSTEMS:   Review of Systems  Constitutional: Negative for chills, fever and weight loss.  HENT: Negative for ear discharge, ear pain and nosebleeds.   Eyes: Negative for blurred vision, pain and discharge.  Respiratory: Positive for shortness of breath. Negative for sputum production, wheezing and stridor.   Cardiovascular: Positive for leg swelling. Negative for chest pain, palpitations, orthopnea and PND.  Gastrointestinal: Negative for abdominal pain, diarrhea, nausea and vomiting.  Genitourinary: Negative for frequency and urgency.  Musculoskeletal: Positive for back pain. Negative for joint pain.  Neurological: Positive for weakness. Negative for sensory change, speech change and focal weakness.  Psychiatric/Behavioral: Negative for depression and hallucinations. The patient is not nervous/anxious.    Tolerating Diet:yes Tolerating PT: home health  DRUG ALLERGIES:   Allergies  Allergen Reactions  . 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 Doesn't believe he has an allergy to PCN as he has taken it previously, according to wife.  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 Doesn't believe he has an allergy to PCN as he has taken it previously, according to wife.    VITALS:  Blood pressure (!) 168/75, pulse 72, temperature 98 F (36.7 C), temperature source Oral, resp. rate 18, height 6\' 1"  (1.854 m), weight 125.7 kg, SpO2 93 %.  PHYSICAL EXAMINATION:   Physical Exam  GENERAL:  58 y.o.-year-old patient lying in the bed with no acute distress. He is obese, chronically ill EYES: Pupils equal, round, reactive to  light and accommodation. No scleral icterus. Extraocular muscles intact.  HEENT: Head atraumatic, normocephalic. Oropharynx and nasopharynx clear.  NECK:  Supple, no jugular venous distention. No thyroid enlargement, no tenderness.  LUNGS: decreased breath sounds bilaterally, no wheezing, rales, rhonchi. No use of accessory muscles of respiration. Right sided perm catheter present CARDIOVASCULAR: S1, S2 normal. No murmurs, rubs, or gallops.  ABDOMEN: Soft, nontender, nondistended. Bowel sounds present. No organomegaly or mass.  EXTREMITIES: No cyanosis, clubbing  + edema b/l.    NEUROLOGIC: Cranial nerves II through XII are intact. No focal Motor or sensory deficits b/l. Some tenderness in the lower back. PSYCHIATRIC:  patient is alert and oriented x 3.  SKIN: mild redness in the lumbar area marked  LABORATORY PANEL:  CBC Recent Labs  Lab 08/02/19 0443  HGB 7.6*    Chemistries  Recent Labs  Lab 07/31/19 0524  08/05/19 0511  NA 133*   < > 138  K 4.9  4.8   < > 4.3  CL 100   < > 104  CO2 23   < > 24  GLUCOSE 140*   < > 240*  BUN 74*   < > 82*  CREATININE 4.43*   < > 3.73*  CALCIUM 7.8*   < > 7.6*  MG 2.1  --   --    < > = values in this interval not displayed.   Cardiac Enzymes No results for input(s): TROPONINI in the last 168 hours. RADIOLOGY:  No results found. ASSESSMENT AND PLAN:  Hiawatha Merriott Murphyis a 58 y.o.malewith a hx of CAD s/p four-vessel CABG (04/2014), HFrEF secondary to ICM, NSVT, paroxysmal SVT, PVD, poorly controlled insulin-dependent diabetes with  diabetic foot ulcers and diabetic peripheral neuropathyrequiring amputations of the left toes, osteomyelitis, CKD stage IV, hypertension, hyperlipidemia  1. Acute on chronic systolic dysfunction with history of ischemic cardiomyopathy -was on IV Lasix 40 mg BID--discontinued ---stop lasix due to rising creat --UOP 9.2 liters. Clinically looks better. sats 95% on RA -monitor input output, creatinine  3.45--4.43--4.61--4.92--5.1--3.7 -appreciate cardiology and nephrology input -low sodium diet -patient has history of coronary artery disease status post CABG  2. Low back pain acute --MRI of the lumbar spine shows Large round 7 centimeter nonspecific soft tissue area  in the subcutaneous fat overlying the L4 vertebra to the right of midline.-This could be an infectious phlegmon or a hematoma.--more favoring hematoma  per neurosx-status post ultrasound guided drain of bloody fluid by IR on September 14th-- fluid culture negative  3. Acute on chronic kidney disease stage IV/with encephalopathy uremic mental status changes -avoid nephrotoxic ends -continue PO bicarb -hold lasix -creat rising 4.6--4.92--5.1-- 3.7 -making good urine -now on hemodialysis day two--- patient will need dialysis on as needed as as outpatient. Outpatient dialysis arrangement to be made.  4. Acute on chronic anemia secondary to kidney disease, questionable hematoma in the back -will transfuse one unit today -came in with hemoglobin of 9.2--- 7.0-- one unit blood transfusion---7.7--7.6 -SCD -hold aspirin  5. Type II diabetes sliding scale insulin  6. Hyperlipidemia continue statins  Spoke with Mrs. [name] and updated today. Anticipating discharge Monday or Tuesday once outpatient dialysis arrangement is made.  Management plans discussed with the patient, family and they are in agreement.  CODE STATUS: full  DVT Prophylaxis: SCD  TOTAL TIME TAKING CARE OF THIS PATIENT: *30* minutes.  >50% time spent on counselling and coordination of care  POSSIBLE D/C IN *?* DAYS, DEPENDING ON CLINICAL CONDITION.  Note: This dictation was prepared with Dragon dictation along with smaller phrase technology. Any transcriptional errors that result from this process are unintentional.  Fritzi Mandes M.D on 08/06/2019 at 1:55 PM  Between 7am to 6pm - Pager - [phone]  After 6pm go to [email or web address] - password [email or web address]  [facility]  Office  [phone]  CC: Primary care physician; Tracie Harrier, MDPatient ID: [name], male   DOB: 03/15/1961, 58 y.o.   MRN: [id number]

## 2019-08-06 NOTE — Plan of Care (Signed)
  Problem: Education: Goal: Knowledge of General Education information will improve Description: Including pain rating scale, medication(s)/side effects and non-pharmacologic comfort measures Outcome: Progressing   Problem: Clinical Measurements: Goal: Diagnostic test results will improve Outcome: Progressing   Problem: Pain Managment: Goal: General experience of comfort will improve Outcome: Progressing   

## 2019-08-06 NOTE — Progress Notes (Signed)
Physical Therapy Treatment Patient Details Name: [name] [name] MRN: [id number] DOB: [birth year] Today's Date: 08/06/2019    History of Present Illness 58 yo male with onset of SOB and ventricular tachycardia was admitted, has been given diuretic for CHF and pulm edema.  Pt has been having hypoxia, drops in BP and has become anemic needing transfusion.  Also having back pain with possible infection, has demand ischemia.  PMHx:  lumbar DJD, L4 foraminal stenosis, cardiomegaly, L foot toe amputations, TEE, PVD, DM, CABG, CHF, CKD, HTN, MI, PN.    PT Comments    Patient in bed with family in room upon PT entering. Patient eager to participate with PT and ambulate in hallway. Patient initially performed good step through pattern of ambulation with RW and CGA however as he fatigued he decreased his stance phase on residual limb with narrowing BOS requiring cueing for correction to increase stability. Patient fatigued by end of ambulation however is agreeable to seated strengthening interventions. Patient challenged with coordination of seated strengthening interventions at this time. Current POC remains appropriate.     Follow Up Recommendations  Home health PT;Supervision for mobility/OOB     Equipment Recommendations  Rolling walker with 5" wheels;3in1 (PT)    Recommendations for Other Services OT consult     Precautions / Restrictions Precautions Precautions: Fall Precaution Comments: monitor BP and O2 sats; h/o orthostatic hypotension Restrictions Weight Bearing Restrictions: No    Mobility  Bed Mobility Overal bed mobility: Modified Independent Bed Mobility: Supine to Sit;Sit to Supine     Supine to sit: Modified independent (Device/Increase time);HOB elevated Sit to supine: Modified independent (Device/Increase time);HOB elevated   General bed mobility comments: Patient able to transfer to EOB with no assistance from PT, slight additional time required for  transfer  Transfers Overall transfer level: Needs assistance Equipment used: Rolling walker (2 wheeled) Transfers: Sit to/from Stand Sit to Stand: Min guard         General transfer comment: Patient performs STS with cueing for proper hand placement,  Ambulation/Gait Ambulation/Gait assistance: Min guard Gait Distance (Feet): 180 Feet Assistive device: Rolling walker (2 wheeled)   Gait velocity: mildly decreased   General Gait Details: Patient ambulates initially with step through pattern of ambulation, upon fatigue has reduced stance phase on LLE. Monitoring SP02 during ambulation with patient remaining in 90 %.   Stairs             Wheelchair Mobility    Modified Rankin (Stroke Patients Only)       Balance Overall balance assessment: Needs assistance Sitting-balance support: No upper extremity supported;Feet supported Sitting balance-Leahy Scale: Good Sitting balance - Comments: steady sitting reaching within BOS   Standing balance support: Bilateral upper extremity supported;During functional activity   Standing balance comment: requires use of RW for ambulation at this time                            Cognition Arousal/Alertness: Awake/alert Behavior During Therapy: [facility] for tasks assessed/performed                                   General Comments: awake and eager to participate. family present with patient in room      Exercises Other Exercises Other Exercises: seated marches, LAQ hold for 3 seconds, 10x each LE. [place] educated on postural correction for reduction of back pain, purse  lipped breathing for increased SP02.    General Comments        Pertinent Vitals/Pain Pain Assessment: 0-10 Pain Score: 5  Pain Location: low back Pain Descriptors / Indicators: Sore;Aching Pain Intervention(s): Limited activity within patient's tolerance;Monitored during session    Home Living                      Prior  Function            PT Goals (current goals can now be found in the care plan section) Acute Rehab PT Goals Patient Stated Goal: go home PT Goal Formulation: With patient Time For Goal Achievement: 08/12/19 Potential to Achieve Goals: Fair Progress towards PT goals: Progressing toward goals    Frequency    Min 2X/week      PT Plan Current plan remains appropriate    Co-evaluation              AM-PAC PT "6 Clicks" Mobility   Outcome Measure  Help needed turning from your back to your side while in a flat bed without using bedrails?: None Help needed moving from lying on your back to sitting on the side of a flat bed without using bedrails?: None Help needed moving to and from a bed to a chair (including a wheelchair)?: A Little Help needed standing up from a chair using your arms (e.g., wheelchair or bedside chair)?: A Little Help needed to walk in hospital room?: A Little Help needed climbing 3-5 steps with a railing? : A Little 6 Click Score: 20    End of Session Equipment Utilized During Treatment: Gait belt Activity Tolerance: Patient tolerated treatment well;Patient limited by fatigue Patient left: in bed;with call bell/phone within reach;with bed alarm set;with family/visitor present Nurse Communication: Mobility status;Precautions PT Visit Diagnosis: Other abnormalities of gait and mobility (R26.89);Unsteadiness on feet (R26.81);Ataxic gait (R26.0);Difficulty in walking, not elsewhere classified (R26.2);Dizziness and giddiness (R42)     Time: 3335-4562 PT Time Calculation (min) (ACUTE ONLY): 15 min  Charges:  $Therapeutic Exercise: 8-22 mins                     Janna Arch, PT, DPT     Janna Arch 08/06/2019, 4:33 PM

## 2019-08-07 LAB — BASIC METABOLIC PANEL
Anion gap: 7 (ref 5–15)
BUN: 58 mg/dL — ABNORMAL HIGH (ref 6–20)
CO2: 29 mmol/L (ref 22–32)
Calcium: 8 mg/dL — ABNORMAL LOW (ref 8.9–10.3)
Chloride: 102 mmol/L (ref 98–111)
Creatinine, Ser: 2.94 mg/dL — ABNORMAL HIGH (ref 0.61–1.24)
GFR calc Af Amer: 26 mL/min — ABNORMAL LOW (ref >60)
GFR calc non Af Amer: 23 mL/min — ABNORMAL LOW (ref >60)
Glucose, Bld: 254 mg/dL — ABNORMAL HIGH (ref 70–99)
Potassium: 4.1 mmol/L (ref 3.5–5.1)
Sodium: 138 mmol/L (ref 135–145)

## 2019-08-07 LAB — GLUCOSE, CAPILLARY
Glucose-Capillary: 212 mg/dL — ABNORMAL HIGH (ref 70–99)
Glucose-Capillary: 282 mg/dL — ABNORMAL HIGH (ref 70–99)
Glucose-Capillary: 297 mg/dL — ABNORMAL HIGH (ref 70–99)
Glucose-Capillary: 304 mg/dL — ABNORMAL HIGH (ref 70–99)

## 2019-08-07 MED ORDER — INSULIN DETEMIR 100 UNIT/ML ~~LOC~~ SOLN
10.0000 [IU] | Freq: Every day | SUBCUTANEOUS | Status: DC
  Administered 2019-08-08: 10 [IU] via SUBCUTANEOUS
  Filled 2019-08-07 (×2): qty 0.1

## 2019-08-07 MED ORDER — HYDRALAZINE HCL 25 MG PO TABS
25.0000 mg | ORAL_TABLET | Freq: Three times a day (TID) | ORAL | Status: DC
  Administered 2019-08-07 – 2019-08-08 (×4): 25 mg via ORAL
  Filled 2019-08-07 (×4): qty 1

## 2019-08-07 NOTE — Progress Notes (Signed)
[facility] at [place] NAME: [name] [name]    MR#:  [id number]  DATE OF BIRTH:  Jan 11, 1961  SUBJECTIVE:   Patient doing wellwifeat the bedside. No new complaints. Good uop. Sugar a bit up REVIEW OF SYSTEMS:   Review of Systems  Constitutional: Negative for chills, fever and weight loss.  HENT: Negative for ear discharge, ear pain and nosebleeds.   Eyes: Negative for blurred vision, pain and discharge.  Respiratory: Positive for shortness of breath. Negative for sputum production, wheezing and stridor.   Cardiovascular: Positive for leg swelling. Negative for chest pain, palpitations, orthopnea and PND.  Gastrointestinal: Negative for abdominal pain, diarrhea, nausea and vomiting.  Genitourinary: Negative for frequency and urgency.  Musculoskeletal: Positive for back pain. Negative for joint pain.  Neurological: Positive for weakness. Negative for sensory change, speech change and focal weakness.  Psychiatric/Behavioral: Negative for depression and hallucinations. The patient is not nervous/anxious.    Tolerating Diet:yes Tolerating PT: home health  DRUG ALLERGIES:   Allergies  Allergen Reactions  . 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 Doesn't believe he has an allergy to PCN as he has taken it previously, according to wife.  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 Doesn't believe he has an allergy to PCN as he has taken it previously, according to wife.    VITALS:  Blood pressure (!) 169/78, pulse 74, temperature 98.1 F (36.7 C), temperature source Oral, resp. rate 19, height 6\' 1"  (1.854 m), weight 125.5 kg, SpO2 95 %.  PHYSICAL EXAMINATION:   Physical Exam  GENERAL:  58 y.o.-year-old patient lying in the bed with no acute distress. He is obese, chronically ill EYES: Pupils equal,  round, reactive to light and accommodation. No scleral icterus. Extraocular muscles intact.  HEENT: Head atraumatic, normocephalic. Oropharynx and nasopharynx clear.  NECK:  Supple, no jugular venous distention. No thyroid enlargement, no tenderness.  LUNGS: decreased breath sounds bilaterally, no wheezing, rales, rhonchi. No use of accessory muscles of respiration. Right sided perm catheter present CARDIOVASCULAR: S1, S2 normal. No murmurs, rubs, or gallops.  ABDOMEN: Soft, nontender, nondistended. Bowel sounds present. No organomegaly or mass.  EXTREMITIES: No cyanosis, clubbing  + edema b/l.    NEUROLOGIC: Cranial nerves II through XII are intact. No focal Motor or sensory deficits b/l. Some tenderness in the lower back. PSYCHIATRIC:  patient is alert and oriented x 3.  SKIN: mild redness in the lumbar area marked  LABORATORY PANEL:  CBC Recent Labs  Lab 08/02/19 0443  HGB 7.6*    Chemistries  Recent Labs  Lab 08/07/19 0904  NA 138  K 4.1  CL 102  CO2 29  GLUCOSE 254*  BUN 58*  CREATININE 2.94*  CALCIUM 8.0*   Cardiac Enzymes No results for input(s): TROPONINI in the last 168 hours. RADIOLOGY:  No results found. ASSESSMENT AND PLAN:  [name] [name] a 58 y.o.malewith a hx of CAD s/p four-vessel CABG (04/2014), HFrEF secondary to ICM, NSVT, paroxysmal SVT, PVD, poorly controlled insulin-dependent diabetes with diabetic foot ulcers and diabetic peripheral neuropathyrequiring amputations of the left toes, osteomyelitis, CKD stage IV, hypertension, hyperlipidemia  1. Acute on chronic systolic dysfunction with history of ischemic cardiomyopathy -was on IV Lasix 40 mg BID--discontinued ---stop lasix due to rising creat --UOP 9.2 liters. Clinically looks better. sats 95% on RA -monitor input output, creatinine 3.45--4.43--4.61--4.92--5.1--3.7 -appreciate cardiology and  nephrology input -low sodium diet -patient has history of coronary artery disease status post  CABG  2. Low back pain acute --MRI of the lumbar spine shows Large round 7 centimeter nonspecific soft tissue area  in the subcutaneous fat overlying the L4 vertebra to the right of midline.-This could be an infectious phlegmon or a hematoma.--more favoring hematoma  per neurosx-status post ultrasound guided drain of bloody fluid by IR on September 14th-- fluid culture negative  3. Acute on chronic kidney disease stage IV/with encephalopathy uremic mental status changes -avoid nephrotoxic ends -continue PO bicarb -hold lasix -creat rising 4.6--4.92--5.1-- 3.7--2.7 -making good urine -now on hemodialysis day two--- patient will need dialysis on as needed as as outpatient. Decision final yet to be made.!  4. Acute on chronic anemia secondary to kidney disease, questionable hematoma in the back -will transfuse one unit today -came in with hemoglobin of 9.2--- 7.0-- one unit blood transfusion---7.7--7.6 -SCD -hold aspirin  5. Type II diabetes sliding scale insulin, Lantus and NovoLog three times a day with meals -patient will be discharged with insulin at home. DC glipizide due to elevated creatinine  6. Hyperlipidemia continue statins  Spoke with Mrs. [name] and updated today. D/w dr Holley Raring  CODE STATUS: full  DVT Prophylaxis: SCD  TOTAL TIME TAKING CARE OF THIS PATIENT: *30* minutes.  >50% time spent on counselling and coordination of care  POSSIBLE D/C IN *1* DAYS, DEPENDING ON CLINICAL CONDITION.  Note: This dictation was prepared with Dragon dictation along with smaller phrase technology. Any transcriptional errors that result from this process are unintentional.  Fritzi Mandes M.D on 08/07/2019 at 1:12 PM  Between 7am to 6pm - Pager - [phone]  After 6pm go to [email or web address] - password [email or web address] [facility]  Office  [phone]  CC: Primary care physician; Tracie Harrier, MDPatient ID: [name] [name], male   DOB: [birth year], 58 y.o.   MRN:  [id number]

## 2019-08-07 NOTE — Progress Notes (Signed)
Occupational Therapy Treatment Patient Details Name: [name] [name] MRN: [id number] DOB: 1961-01-17 Today's Date: 08/07/2019    History of present illness 58 yo male with onset of SOB and ventricular tachycardia was admitted, has been given diuretic for CHF and pulm edema.  Pt has been having hypoxia, drops in BP and has become anemic needing transfusion.  Also having back pain with possible infection, has demand ischemia.  PMHx:  lumbar DJD, L4 foraminal stenosis, cardiomegaly, L foot toe amputations, TEE, PVD, DM, CABG, CHF, CKD, HTN, MI, PN.   OT comments  [name] [name] was seen for OT tx session this date. Pt received semi-supine in bed with wife present at bedside. Pt agreeable to participating in OT session this date. Pt requesting to complete UB seated bath and grooming tasks this date. OT set-up supplies necessary for ADL tasks and assisted pt throughout ADL session (See ADL section below for additional details). Pt tolerated treatment session well with no reports of adverse symptoms during session. Pt wife present and participated throughout session. Pt and caregiver educated in The 4 P's of energy conservation strategies to maximize pt safety and activity tolerance upon return to meaningful occupations of daily life. Pt and caregiver verbalized understanding of education provided. Pt making good progress toward OT goals and continues to benefit from skilled OT during this admission. Will continue to follow POC as written. DC recommendation remains appropriate.    Follow Up Recommendations  No OT follow up    Equipment Recommendations  3 in 1 bedside commode;Other (comment)    Recommendations for Other Services      Precautions / Restrictions Precautions Precautions: Fall Precaution Comments: monitor BP and O2 sats; h/o orthostatic hypotension Restrictions Weight Bearing Restrictions: No       Mobility Bed Mobility Overal bed mobility: Modified Independent   Rolling: Modified  independent (Device/Increase time) Sidelying to sit: Modified independent (Device/Increase time)       General bed mobility comments: Patient able to transfer to EOB with no assistance from OT, slight additional time required for transfer  Transfers Overall transfer level: Needs assistance Equipment used: None Transfers: Sit to/from Stand Sit to Stand: Supervision Stand pivot transfers: Supervision       General transfer comment: Pt completed STS with no AD this date. Supervision for safety.    Balance Overall balance assessment: Needs assistance Sitting-balance support: No upper extremity supported;Feet supported Sitting balance-Leahy Scale: Good Sitting balance - Comments: steady sitting reaching within BOS. Good static and dynamic balance. Steady weight shifting during functional activity.   Standing balance support: No upper extremity supported;During functional activity Standing balance-Leahy Scale: Good Standing balance comment: Steady standing completing oral care at sink. Able to use BUE during functional task. No UE support during static standing. Tended to use 1 UE support during short ambulation to/from bed.                           ADL either performed or assessed with clinical judgement   ADL Overall ADL's : Needs assistance/impaired Eating/Feeding: Set up;Independent   Grooming: Oral care;Wash/dry face;Sitting;Standing;Applying deodorant;Supervision/safety;Set up Grooming Details (indicate cue type and reason): Pt able to complete washing face and applying deodorant while seated EOB this date. Pt demonstrated good overall activity tolerance during seated grooming and bathing tasks. Once finished, pt attempted oral care while standing at sink. Pt able to stand and brush teeth with supervision to CGA for safety. VC's throughout activity for implementation of energy  conservation strategies. Upper Body Bathing: Set up;Supervision/ safety;Sitting Upper Body  Bathing Details (indicate cue type and reason): Pt completed seated bath while sitting EOB this date. He was able to use a wash basin to wash his upper body with instructions to keep a towel over his perm cath to ensure it remained dry. Pt did well with this task with no noted SOB or desat.     Upper Body Dressing : Set up;Supervision/safety;Sitting Upper Body Dressing Details (indicate cue type and reason): Pt changed into clean hospital gown this date.                         Vision Baseline Vision/History: Wears glasses;Retinopathy Wears Glasses: Reading only Patient Visual Report: No change from baseline     Perception     Praxis      Cognition Arousal/Alertness: Awake/alert Behavior During Therapy: WFL for tasks assessed/performed Overall Cognitive Status: Within Functional Limits for tasks assessed                                 General Comments: Awake/alert, following multi-step cues consistently. Wife at bedside t/o session.        Exercises Other Exercises Other Exercises: Pt and caregiver instructed in Energy conservation strategies including pursed lip breathing and "The 4 P's". Both verbalized understanding of education provided. Other Exercises: Pt assisted with seated bathing and grooming tasks this date. OT provided set-up/supervision & cueing for implementation of ECS into functional routines.   Shoulder Instructions       General Comments Pt BP/HR/SpO2 stable t/o session. Pt denies dizziness, SOB, or lightheadedness this date. SpO2 dropped to 85 after standing grooming task. Rebounded to >95 with rest break and cues for PLB.    Pertinent Vitals/ Pain       Faces Pain Scale: No hurt Pain Location: Pt denies pain with activity this date. Premedicated prior to session. Pain Intervention(s): Premedicated before session;Monitored during session  Home Living                                          Prior  Functioning/Environment              Frequency  Min 1X/week        Progress Toward Goals  OT Goals(current goals can now be found in the care plan section)  Progress towards OT goals: Progressing toward goals  Acute Rehab OT Goals Patient Stated Goal: go home OT Goal Formulation: With patient Time For Goal Achievement: 08/17/19 Potential to Achieve Goals: Good  Plan Discharge plan remains appropriate;Frequency remains appropriate    Co-evaluation                 AM-PAC OT "6 Clicks" Daily Activity     Outcome Measure   Help from another person eating meals?: None Help from another person taking care of personal grooming?: A Little Help from another person toileting, which includes using toliet, bedpan, or urinal?: A Little Help from another person bathing (including washing, rinsing, drying)?: A Little Help from another person to put on and taking off regular upper body clothing?: A Little Help from another person to put on and taking off regular lower body clothing?: A Little 6 Click Score: 19    End of Session Equipment Utilized During  Treatment: Gait belt  OT Visit Diagnosis: Other abnormalities of gait and mobility (R26.89);Pain Pain - part of body: (Low back)   Activity Tolerance Patient tolerated treatment well   Patient Left in bed;with call bell/phone within reach;with bed alarm set;with family/visitor present   Nurse Communication Other (comment)(Pt SpO2 during ADLs.)        Time: 6440-3474 OT Time Calculation (min): 43 min  Charges: OT General Charges $OT Visit: 1 Visit OT Treatments $Self Care/Home Management : 38-52 mins  Shara Blazing, M.S., OTR/L Ascom: [phone] 08/07/19, 3:16 PM

## 2019-08-07 NOTE — Progress Notes (Signed)
Inpatient Diabetes Program Recommendations  AACE/ADA: New Consensus Statement on Inpatient Glycemic Control (2015)  Target Ranges:  Prepandial:   less than 140 mg/dL      Peak postprandial:   less than 180 mg/dL (1-2 hours)      Critically ill patients:  140 - 180 mg/dL   Lab Results  Component Value Date   GLUCAP 212 (H) 08/07/2019   HGBA1C 7.2 (H) 07/28/2019    Review of Glycemic Control Results for [name] [name], [name] [name] (MRN [id number]) as of 08/07/2019 11:29  Ref. Range 08/06/2019 07:36 08/06/2019 12:41 08/06/2019 16:35 08/06/2019 21:01 08/07/2019 08:36  Glucose-Capillary Latest Ref Range: 70 - 99 mg/dL 216 (H) 325 (H) 276 (H) 268 (H) 212 (H)   Diabetes history: DM2 Outpatient Diabetes medications: Amaryl 4 mg BID Current orders for Inpatient glycemic control: Levemir 6 units (increasing to 10 units starting in am) + Novolog 2 units tid meal coverage + Novolog 0-9 units TID with meals, Novolog 0-5 units QHS  Inpatient Diabetes Program Recommendations:   Noted Levemir to increase to 10 units starting in am. -Increase Novolog meal coverage to 3 units tid if eats 50%  Thank you, Bethena Roys E. Kamaron Deskins, RN, MSN, CDE  Diabetes Coordinator Inpatient Glycemic Control Team Team Pager [phone] (8am-5pm) 08/07/2019 11:32 AM

## 2019-08-07 NOTE — Care Management Important Message (Signed)
Important Message  Patient Details  Name: [name] [name] MRN: [id number] Date of Birth: 29-Jan-1961   Medicare Important Message Given:  Yes     Dannette Barbara 08/07/2019, 11:24 AM

## 2019-08-07 NOTE — Progress Notes (Signed)
Physical Therapy Treatment Patient Details Name: [name] [name] MRN: [id number] DOB: 03/28/1961 Today's Date: 08/07/2019    History of Present Illness 58 yo male with onset of SOB and ventricular tachycardia was admitted, has been given diuretic for CHF and pulm edema.  Pt has been having hypoxia, drops in BP and has become anemic needing transfusion.  Also having back pain with possible infection, has demand ischemia.  PMHx:  lumbar DJD, L4 foraminal stenosis, cardiomegaly, L foot toe amputations, TEE, PVD, DM, CABG, CHF, CKD, HTN, MI, PN.    PT Comments    Pt reporting 5/10 low back pain beginning of session and 3-4/10 end of session resting in bed.  Able to perform bed mobility modified independently, SBA with transfers, and CGA to SBA ambulating around nursing loop with RW.  Steady safe functional mobility noted with RW use.  Tolerated LE ex's in bed well.  Pt appearing alert and able to follow multi-step commands consistently.  Will continue to focus on strengthening and progressive functional mobility during hospital stay.    Follow Up Recommendations  Home health PT;Supervision for mobility/OOB     Equipment Recommendations  Rolling walker with 5" wheels;3in1 (PT)    Recommendations for Other Services OT consult     Precautions / Restrictions Precautions Precautions: Fall Precaution Comments: monitor BP and O2 sats; h/o orthostatic hypotension Restrictions Weight Bearing Restrictions: No    Mobility  Bed Mobility Overal bed mobility: Modified Independent Bed Mobility: Rolling;Sidelying to Sit;Sit to Sidelying Rolling: Modified independent (Device/Increase time) Sidelying to sit: Modified independent (Device/Increase time)     Sit to sidelying: Modified independent (Device/Increase time) General bed mobility comments: mild increased effort to perform on own but no assist required  Transfers Overall transfer level: Needs assistance Equipment used: Rolling walker (2  wheeled) Transfers: Sit to/from Stand Sit to Stand: Supervision Stand pivot transfers: Supervision       General transfer comment: strong stand noted; controlled descent sitting onto bed  Ambulation/Gait Ambulation/Gait assistance: Min guard;Supervision Gait Distance (Feet): 180 Feet Assistive device: Rolling walker (2 wheeled)   Gait velocity: mildly decreased   General Gait Details: step through gait pattern; steady   Chief Strategy Officer    Modified Rankin (Stroke Patients Only)       Balance Overall balance assessment: Needs assistance Sitting-balance support: No upper extremity supported;Feet supported Sitting balance-Leahy Scale: Normal Sitting balance - Comments: steady sitting reaching outside BOS   Standing balance support: No upper extremity supported Standing balance-Leahy Scale: Good Standing balance comment: steady standing reaching within BOS                            Cognition Arousal/Alertness: Awake/alert Behavior During Therapy: WFL for tasks assessed/performed Overall Cognitive Status: Within Functional Limits for tasks assessed                                 General Comments: Awake/alert, following multi-step cues consistently. Wife at bedside t/o session.      Exercises Total Joint Exercises Short Arc Quad: AROM;Strengthening;Both;10 reps;Supine Heel Slides: AROM;Strengthening;Both;10 reps;Supine Hip ABduction/ADduction: AROM;Strengthening;Both;10 reps;Supine Straight Leg Raises: AROM;Strengthening;Both;10 reps;Supine   General Comments General comments (skin integrity, edema, etc.)      Pertinent Vitals/Pain Pain Assessment: 0-10 Pain Score: 4  Faces Pain Scale: No hurt Pain Location: low back pain  Pain Descriptors / Indicators: Sore;Aching Pain Intervention(s): Limited activity within patient's tolerance;Monitored during session;Premedicated before session;Repositioned  BP 163/74  with HR 76 bpm beginning of session and BP 161/73 with HR 77 bpm end of ambulation resting sitting on edge of bed.    Home Living                      Prior Function            PT Goals (current goals can now be found in the care plan section) Acute Rehab PT Goals Patient Stated Goal: go home PT Goal Formulation: With patient Time For Goal Achievement: 08/12/19 Potential to Achieve Goals: Good Progress towards PT goals: Progressing toward goals    Frequency    Min 2X/week      PT Plan Current plan remains appropriate    Co-evaluation              AM-PAC PT "6 Clicks" Mobility   Outcome Measure  Help needed turning from your back to your side while in a flat bed without using bedrails?: None Help needed moving from lying on your back to sitting on the side of a flat bed without using bedrails?: None Help needed moving to and from a bed to a chair (including a wheelchair)?: A Little Help needed standing up from a chair using your arms (e.g., wheelchair or bedside chair)?: A Little Help needed to walk in hospital room?: A Little Help needed climbing 3-5 steps with a railing? : A Little 6 Click Score: 20    End of Session Equipment Utilized During Treatment: Gait belt Activity Tolerance: Patient tolerated treatment well Patient left: in bed;with call bell/phone within reach;with bed alarm set Nurse Communication: Mobility status;Precautions PT Visit Diagnosis: Other abnormalities of gait and mobility (R26.89);Unsteadiness on feet (R26.81);Ataxic gait (R26.0);Difficulty in walking, not elsewhere classified (R26.2);Dizziness and giddiness (R42)     Time: 3474-2595 PT Time Calculation (min) (ACUTE ONLY): 23 min  Charges:  $Gait Training: 8-22 mins $Therapeutic Exercise: 8-22 mins                    Leitha Bleak, PT 08/07/19, 4:39 PM [phone]

## 2019-08-07 NOTE — Progress Notes (Signed)
[facility], [place] 08/07/19  Subjective:  Renal parameters do appear to be improving. BUN down to 58 with a creatinine of 2.9. Urine output was 1.2 L over the preceding 24 hours.  Objective:  Vital signs in last 24 hours:  Temp:  [97.9 F (36.6 C)-99 F (37.2 C)] 98.1 F (36.7 C) (09/21 0805) Pulse Rate:  [74-79] 74 (09/21 0805) Resp:  [18-20] 19 (09/21 0418) BP: (130-169)/(66-96) 169/78 (09/21 0805) SpO2:  [95 %-98 %] 95 % (09/21 0805) Weight:  [125.5 kg] 125.5 kg (09/21 0418)  Weight change: -0.237 kg Filed Weights   08/05/19 0347 08/06/19 0359 08/07/19 0418  Weight: 127.9 kg 125.7 kg 125.5 kg    Intake/Output:    Intake/Output Summary (Last 24 hours) at 08/07/2019 1131 Last data filed at 08/07/2019 0109 Gross per 24 hour  Intake 240 ml  Output 750 ml  Net -510 ml   Physical Exam: Gen:   Alert, cooperative, no distress, appears stated age Head:   Normocephalic, without obvious abnormality, atraumatic Eyes/ENT:  conjunctiva clear,  moist oral mucus membranes Neck:  Supple,   Lungs:   Clear to auscultation bilaterally, respirations unlabored Heart:   No rub  Abdomen:   Soft, non-tender  Extremities: no cyanosis , 1+ b/l edema in thighs and lower abdomen Skin:  Skin color, texture, turgor normal, no rashes or lesions Neurologic: Alert, able to answer questions today    Basic Metabolic Panel:  Recent Labs  Lab 08/01/19 1026 08/02/19 0443 08/03/19 0445 08/04/19 0512 08/05/19 0511 08/07/19 0904  NA 134* 134* 133* 136 138 138  K 4.1 4.4 4.6 4.3 4.3 4.1  CL 100 99 100 103 104 102  CO2 '24 24 22 22 24 29  ' GLUCOSE 197* 164* 286* 242* 240* 254*  BUN 82* 91* 107* 97* 82* 58*  CREATININE 4.59* 4.92* 5.19* 4.87* 3.73* 2.94*  CALCIUM 8.1* 7.7* 7.6* 7.7* 7.6* 8.0*  PHOS 6.6* 6.8* 5.7* 5.6* 5.3*  --      CBC: Recent Labs  Lab 08/02/19 0443  HGB 7.6*      Lab Results  Component Value Date   HEPBSAG Negative 08/04/2019   HEPBSAB Non Reactive 08/04/2019      Microbiology:  Recent Results (from the past 240 hour(s))  CULTURE, BLOOD (ROUTINE X 2) w Reflex to ID Panel     Status: None   Collection Time: 07/30/19  6:55 PM   Specimen: BLOOD  Result Value Ref Range Status   Specimen Description BLOOD RIGHT ANTECUBITAL  Final   Special Requests   Final    BOTTLES DRAWN AEROBIC AND ANAEROBIC Blood Culture adequate volume   Culture   Final    NO GROWTH 5 DAYS Performed at [facility], [address] [place], [place] [address]    Report Status 08/04/2019 FINAL  Final  CULTURE, BLOOD (ROUTINE X 2) w Reflex to ID Panel     Status: None   Collection Time: 07/30/19  6:55 PM   Specimen: BLOOD  Result Value Ref Range Status   Specimen Description BLOOD RIGHT HAND  Final   Special Requests   Final    BOTTLES DRAWN AEROBIC AND ANAEROBIC Blood Culture adequate volume   Culture   Final    NO GROWTH 5 DAYS Performed at [facility], [address], [place], [place] [address]    Report Status 08/04/2019 FINAL  Final  Aerobic/Anaerobic Culture (surgical/deep wound)     Status: None   Collection Time: 07/31/19  11:35 AM   Specimen: Wound; Tissue  Result Value Ref Range Status   Specimen Description   Final    WOUND RIGHT LATERAL L4 SPINE Performed at [facility] [address] [place], [place] [address]    Special Requests   Final    Normal Performed at [facility], [place], [place] [address]    Gram Stain NO WBC SEEN NO ORGANISMS SEEN   Final   Culture   Final    No growth aerobically or anaerobically. Performed at [facility], [place] [address], [place], [place] [address]    Report Status 08/05/2019 FINAL  Final    Coagulation Studies: No results for input(s): LABPROT, INR in the last 72 hours.  Urinalysis: No results for input(s): COLORURINE, LABSPEC, PHURINE, GLUCOSEU, HGBUR, BILIRUBINUR, KETONESUR, PROTEINUR, UROBILINOGEN, NITRITE,  LEUKOCYTESUR in the last 72 hours.  Invalid input(s): APPERANCEUR    Imaging: No results found.   Medications:   . sodium chloride Stopped (08/01/19 0106)   . atorvastatin  40 mg Oral QHS  . carvedilol  25 mg Oral BID WC  . Chlorhexidine Gluconate Cloth  6 each Topical Q0600  . EPINEPHrine  0.3 mg Intramuscular Once  . epoetin (EPOGEN/PROCRIT) injection  10,000 Units Subcutaneous Weekly  . famotidine  20 mg Oral QHS  . feeding supplement (NEPRO CARB STEADY)  237 mL Oral TID BM  . ferrous sulfate  325 mg Oral Daily  . gabapentin  100 mg Oral TID  . hydrALAZINE  25 mg Oral TID  . insulin aspart  0-5 Units Subcutaneous QHS  . insulin aspart  0-9 Units Subcutaneous TID WC  . insulin aspart  2 Units Subcutaneous TID WC  . [START ON 08/08/2019] insulin detemir  10 Units Subcutaneous Daily  . isosorbide mononitrate  30 mg Oral Daily  . lidocaine  1 patch Transdermal Q24H  . multivitamin  1 tablet Oral QHS  . polyethylene glycol  17 g Oral Daily  . senna-docusate  1 tablet Oral BID  . sodium bicarbonate  1,300 mg Oral BID  . sodium chloride flush  10 mL Intravenous Q12H  . torsemide  40 mg Oral Daily   sodium chloride, acetaminophen **OR** acetaminophen, heparin, HYDROmorphone (DILAUDID) injection, magnesium hydroxide, nitroGLYCERIN, ondansetron **OR** ondansetron (ZOFRAN) IV, ondansetron (ZOFRAN) IV, oxyCODONE  Assessment/ Plan:  58 y.o. male with  hypertension, diabetes mellitus type II, diabetic neuropathy, systolic congestive heart failure, peripheral vascular disease status post left toe amputations, autonomic neuropathy, coronary artery disease status post CABG, history of osteomyelitis, who has been admitted to [facility] on 07/27/2019 for acute exacerbation of congestive heart failure.   Active Problems:   Acute on chronic combined systolic and diastolic CHF (congestive heart failure) (HCC)   Acute on chronic respiratory failure with hypoxemia (HCC)   Acute midline low back pain  without sciatica   #.AKI on  CKD st 4 Recent Labs    08/03/19 0445 08/04/19 0512 08/05/19 0511 08/07/19 0904  CREATININE 5.19* 4.87* 3.73* 2.94*  Baseline Cr 2.42 from April 2020/ GFR 29. Cause of AKI is unclear at present, but likely cardiorenal & ATN from excessive diuresis  -Creatinine currently down to 2.9 with an EGFR 23 and urine output was 1.2 L over the preceding 24 hours.  Patient may be experiencing renal recovery.  Hold off on dialysis.  Reevaluate urine output and creatinine tomorrow.  If found to be improved we may consider discontinuing PermCath but hold off for right  now.    # LE edema, Chronic sys CHF Continue to monitor urine output and cardiopulmonary status.  #. Anemia of CKD  Lab Results  Component Value Date   HGB 7.6 (L) 08/02/2019    Continue Epogen 10,000 units subcutaneous weekly for now.  #. SHPTH  No results found for: PTH Lab Results  Component Value Date   PHOS 5.3 (H) 08/05/2019  Most recent serum phosphorus level was 5.3.  Continue to monitor.   #. Diabetes type 2 with CKD Glycemic control as per hospitalist.  #  Back pain Surgical team is following along.  No indication for I&D   LOS: 10 [name] [name] 9/21/202011:31 AM  [facility] [place], [place]

## 2019-08-07 NOTE — TOC Benefit Eligibility Note (Signed)
Transition of Care [facility]) Benefit Eligibility Note    Patient Details  Name: [name] [name] MRN: [id number] Date of Birth: [birth year]   Medication/Dose: Levemir Flex Touch Insulin Pen  Covered?: Yes  Tier: 3 Drug  Prescription Coverage Preferred Pharmacy: [facility] with Person/Company/Phone Number:: Shary Decamp with [employer] at [phone]  Co-Pay: $47 estimated copay for both retail and mail order  Prior Approval: No  Deductible: Unmet($160 deductible, $0 applied as of time of call.  $4,020 coverage cap limit, $541.35 met as of time of call.)    Dannette Barbara Phone Number:  [phone] or [phone] 08/07/2019, 2:20 PM

## 2019-08-08 ENCOUNTER — Encounter
Admission: EM | Disposition: A | Discharge status: Home Health | Payer: Self-pay | Source: Home / Self Care | Attending: Internal Medicine

## 2019-08-08 DIAGNOSIS — N185 Chronic kidney disease, stage 5: Secondary | ICD-10-CM

## 2019-08-08 DIAGNOSIS — T82898A Other specified complication of vascular prosthetic devices, implants and grafts, initial encounter: Secondary | ICD-10-CM

## 2019-08-08 HISTORY — PX: DIALYSIS/PERMA CATHETER REMOVAL: CATH118289

## 2019-08-08 LAB — BASIC METABOLIC PANEL
Anion gap: 10 (ref 5–15)
BUN: 67 mg/dL — ABNORMAL HIGH (ref 6–20)
CO2: 27 mmol/L (ref 22–32)
Calcium: 8 mg/dL — ABNORMAL LOW (ref 8.9–10.3)
Chloride: 99 mmol/L (ref 98–111)
Creatinine, Ser: 3.33 mg/dL — ABNORMAL HIGH (ref 0.61–1.24)
GFR calc Af Amer: 23 mL/min — ABNORMAL LOW (ref >60)
GFR calc non Af Amer: 20 mL/min — ABNORMAL LOW (ref >60)
Glucose, Bld: 304 mg/dL — ABNORMAL HIGH (ref 70–99)
Potassium: 4.3 mmol/L (ref 3.5–5.1)
Sodium: 136 mmol/L (ref 135–145)

## 2019-08-08 LAB — GLUCOSE, CAPILLARY
Glucose-Capillary: 261 mg/dL — ABNORMAL HIGH (ref 70–99)
Glucose-Capillary: 263 mg/dL — ABNORMAL HIGH (ref 70–99)

## 2019-08-08 SURGERY — DIALYSIS/PERMA CATHETER REMOVAL

## 2019-08-08 MED ORDER — HYDRALAZINE HCL 25 MG PO TABS: 25.0000 mg | ORAL_TABLET | Freq: Three times a day (TID) | ORAL | 3 refills | Status: DC

## 2019-08-08 MED ORDER — RENA-VITE PO TABS: 1.0000 | ORAL_TABLET | Freq: Every day | ORAL | 0 refills | Status: DC

## 2019-08-08 MED ORDER — LIDOCAINE-EPINEPHRINE (PF) 1 %-1:200000 IJ SOLN
INTRAMUSCULAR | Status: DC | PRN
  Administered 2019-08-08: 20 mL via INTRADERMAL

## 2019-08-08 MED ORDER — INSULIN DETEMIR 100 UNIT/ML ~~LOC~~ SOLN: 15.0000 [IU] | Freq: Every day | SUBCUTANEOUS | 11 refills | Status: DC

## 2019-08-08 MED ORDER — HYDROMORPHONE HCL 2 MG PO TABS: 1.0000 mg | ORAL_TABLET | Freq: Two times a day (BID) | ORAL | 0 refills | Status: AC | PRN

## 2019-08-08 MED ORDER — TORSEMIDE 20 MG PO TABS: 40.0000 mg | ORAL_TABLET | Freq: Every day | ORAL | 1 refills | Status: DC

## 2019-08-08 MED ORDER — OXYCODONE HCL 10 MG PO TABS: 10.0000 mg | ORAL_TABLET | Freq: Three times a day (TID) | ORAL | 0 refills | Status: DC | PRN

## 2019-08-08 MED ORDER — INSULIN ASPART 100 UNIT/ML ~~LOC~~ SOLN: 0.0000 [IU] | Freq: Three times a day (TID) | SUBCUTANEOUS | 11 refills | Status: AC

## 2019-08-08 SURGICAL SUPPLY — 2 items
FORCEPS HALSTEAD CVD 5IN STRL (INSTRUMENTS) ×3 IMPLANT
TRAY LACERAT/PLASTIC (MISCELLANEOUS) ×3 IMPLANT

## 2019-08-08 NOTE — Progress Notes (Signed)
PT Cancellation Note  Patient Details Name: [name] [name] MRN: [id number] DOB: 1961-10-12   Cancelled Treatment:    Reason Eval/Treat Not Completed: Patient at procedure or test/unavailable.  Pt currently off floor at procedure.  Will re-attempt PT session at a later date/time.  Leitha Bleak, PT 08/08/19, 3:12 PM [phone]

## 2019-08-08 NOTE — Progress Notes (Signed)
Nutrition Follow Up Note   DOCUMENTATION CODES:   Obesity unspecified  INTERVENTION:   Discontinue Nepro Shake   Rena-vite daily  Bowel regimen per MD    NUTRITION DIAGNOSIS:   Increased nutrient needs related to chronic illness(ESRD new HD) as evidenced by increased estimated needs  GOAL:   Patient will meet greater than or equal to 90% of their needs  -progressing   MONITOR:   PO intake, Supplement acceptance, Labs, Weight trends, Skin  ASSESSMENT:   58 y.o. male with a hx of CAD s/p four-vessel CABG (04/2014), HFrEF secondary to ICM, NSVT, paroxysmal SVT, PVD, poorly controlled insulin-dependent diabetes with diabetic foot ulcers and diabetic peripheral neuropathy requiring amputations of the left toes, osteomyelitis, CKD stage IV, hypertension, hyperlipidemia, asymptomatic orthostatic hypotension requiring new HD 9/18  Pt continues to have good appetite and oral intake; pt eating 100% of meals and drinking some Nepro. Pt no longer requiring HD, will discontinue Nepro supplements. Per chart, pt is fairly weight stable since admit. No BM noted since 9/18; recommend bowel regimen as needed per MD.   Medications reviewed and include: epoetin, pepcid, ferrous sulfate, insulin, miralax, rena-vite, Na bicarb, senokot, torsemide    Labs reviewed: K 4.3 wnl, BUN 67(H), creat 3.33(H) P 5.3(H)- 9/19 cbgs- 261, 263 x 24 hrs  Diet Order:   Diet Order            Diet Carb Modified Fluid consistency: Thin; Room service appropriate? Yes  Diet effective now             EDUCATION NEEDS:   Not appropriate for education at this time  Skin:  Skin Assessment: Reviewed RN Assessment(incision back)  Last BM:  9/18- type 6  Height:   Ht Readings from Last 1 Encounters:  08/04/19 6\' 1"  (1.854 m)    Weight:   Wt Readings from Last 1 Encounters:  08/08/19 126.4 kg    Ideal Body Weight:  83.6 kg  BMI:  Body mass index is 36.76 kg/m.  Estimated Nutritional Needs:    Kcal:  2600-2900kcal/day  Protein:  >125g/day  Fluid:  2L/day  Koleen Distance MS, RD, LDN Pager #- [phone] Office#- [phone] After Hours Pager: [phone]

## 2019-08-08 NOTE — TOC Transition Note (Signed)
Transition of Care [facility]) - CM/SW Discharge Note   Patient Details  Name: [name] [name] MRN: [id number] Date of Birth: December 05, 1960  [facility] CM/SW Contact:  Candie Chroman, LCSW Phone Number: 08/08/2019, 3:16 PM   Clinical Narrative:  Patient will discharge home today. Home health orders are in. [facility] representative is aware. PT and OT are recommending a rolling walker and 3-in-1. CSW called wife who stated they already have these. Patient's wife will pick him up today. No further concerns. CSW signing off.   Final next level of care: Home w Home Health Services Barriers to Discharge: Barriers Resolved   Patient Goals and CMS Choice Patient states their goals for this hospitalization and ongoing recovery are:: discharge home with home health   Choice offered to / list presented to : Spouse  Discharge Placement                Patient to be transferred to facility by: wife Name of family member notified: Alexandra Posadas Patient and family notified of of transfer: 08/08/19  Discharge Plan and Services   Discharge Planning Services: CM Consult Post Acute Care Choice: Home Health          DME Arranged: N/A         HH Arranged: RN, PT, Nurse's Aide [place] Agency: [facility] (formerly [employer]) Date [place]: 08/08/19 Time [place]: 97 Representative spoke with at [place]: [place] ([place]) Interventions     Readmission Risk Interventions Readmission Risk Prevention Plan 08/01/2019  Transportation Screening Complete  PCP or Specialist Appt within 3-5 Days Complete  HRI or Home Care Consult Complete  Social Work Consult for [place] Planning/Counseling Complete  Palliative Care Screening Not Applicable  Medication Review Press photographer) Complete  Some recent data might be hidden

## 2019-08-08 NOTE — Op Note (Signed)
  OPERATIVE NOTE   PROCEDURE: 1. Removal of a right IJ tunneled dialysis catheter  PRE-OPERATIVE DIAGNOSIS: Complication of dialysis catheter, End stage renal disease  POST-OPERATIVE DIAGNOSIS: Same  SURGEON: Hortencia Pilar, M.D.  ASSISTANT: Ms. Hezzie Bump  ANESTHESIA: Local anesthetic with 1% lidocaine with epinephrine   ESTIMATED BLOOD LOSS: Minimal   FINDING(S): 1. Catheter intact   SPECIMEN(S):  Catheter  INDICATIONS:   [name] [name] is a 58 y.o. male who presents with probable catheter related sepsis.  Therefore the patient is undergoing removal of his tunneled catheter secondary to septic complications.   DESCRIPTION: After obtaining full informed written consent, the patient was positioned supine.   A first assistant is required in order to allow for a safe and more efficient operation.  Duties include retraction of tissues to allow for optimal exposure, assisting with suture ligation of vessels as well as maintaining a clear field of view with suction as needed.  Further duties include assisting with patient positioning during the surgery as well as wound closure.  I believe that this procedure requires a first assistant in order for it to be performed at a level in keeping with the high standards of this institution.  The right IJ catheter and surrounding area is prepped and draped in a sterile fashion. The cuff was localized by palpation and noted to be less than 3 cm from the exit site. After appropriate timeout is called, 1% lidocaine with epinephrine is infiltrated into the surrounding tissues around the cuff. Small transverse incision is created at the exit site with an 11 blade scalpel and the dissection was carried up along the catheter to expose the cuff of the tunneled catheter.  The catheter cuff is then freed from the surrounding attachments and adhesions. Once the catheter has been freed circumferentially it is removed in 1 piece. Light pressure was held  at the base of the neck.   Antibiotic ointment and a sterile dressing is applied to the exit site. Patient tolerated procedure well and there were no complications.  COMPLICATIONS: None  CONDITION: Unchanged  Hortencia Pilar, M.D. [facility] Office: [phone]  08/08/2019,6:13 PM

## 2019-08-08 NOTE — Progress Notes (Signed)
Per Dr. Holley Raring, patient has regained function and no longer is requiring outpatient dialysis.

## 2019-08-08 NOTE — Discharge Summary (Signed)
[place] [facility] [place] NAME: [name] [name]    MR#:  [id number]  DATE OF BIRTH:  1961/05/09  DATE OF ADMISSION:  07/27/2019 ADMITTING PHYSICIAN: [name] Foreman, [name]  DATE OF DISCHARGE: 08/08/2019  PRIMARY CARE PHYSICIAN: [name] Harrier, [name]    ADMISSION DIAGNOSIS:  Peripheral edema [R60.9] Acute midline low back pain without sciatica [M54.5] Stage 4 chronic kidney disease ([place]) [N18.4] Acute on chronic congestive heart failure, unspecified heart failure type ([place]) [I50.9]  DISCHARGE DIAGNOSIS:  *acute on chronic systolic dysfunction with history of ischemic cardiomyopathy *low back pain acute suspected due to hematoma *acute on chronic kidney disease stage IV with uremic encephalopathy requiring transient dialysis *acute on chronic anemia secondary to kidney disease with hematoma status post one unit blood transfusion *type II diabetes now on insulin  SECONDARY DIAGNOSIS:   Past Medical History:  Diagnosis Date  . CAD (coronary artery disease)    a. 04/2014 CABG x 4 (LIMA->LAD, VG->Diag, VG->OM, VG->PDA).  . Chronic systolic CHF (congestive heart failure) ([place])    a. 07/2014 Echo: EF 30-35%.  . CKD (chronic kidney disease), stage III ([place])    a. 12/2015 Creat 1.8.  . Diabetic foot ulcers ([place])    a. left foot 2nd digit ant 5 th digit 05/03/14; b. 08/2014 s/p amputation of toes on L foot.  . Hypercholesterolemia    a. 12/2015 TC 116, TG 154, HDL 24, LDL 61-->Atorvastatin 40.  Marland Kitchen Hypertension   . Hypertensive heart disease   . Ischemic cardiomyopathy    a. 07/2013 Echo: EF 20%; b. 07/2014 Echo: EF 30-35%, diff HK, Gr1 DD, mildly dil LA, nl PASP.  Marland Kitchen Myocardial infarction ([place])   . Neuropathy in diabetes ([place])   . Open wound    foot  . Orthostatic hypotension   . Peripheral vascular disease ([place])   . Type II diabetes mellitus ([place])    a. 12/2015 HbA1c = 13.9.    HOSPITAL COURSE:   [name] [name] a 58 y.o.malewith a hx  of CAD s/p four-vessel CABG (04/2014), HFrEF secondary to ICM, NSVT, paroxysmal SVT, PVD, poorly controlled insulin-dependent diabetes with diabetic foot ulcers and diabetic peripheral neuropathyrequiring amputations of the left toes, osteomyelitis, CKD stage IV, hypertension, hyperlipidemia  1. Acute on chronic systolic dysfunction with history of ischemic cardiomyopathy -was on IV Lasix 40 mg BID--discontinued--now on po torsemide 40 mg daily --good UOP. Clinically looks better. sats 95% on RA -monitor input output, creatinine 3.45--4.43--4.61--4.92--5.1--3.7-2.7-3.3 -appreciate cardiology and nephrology input -low sodium diet -patient has history of coronary artery disease status post CABG  2. Low back pain acute-- suspected due to hematoma --MRI of the lumbar spine shows Large round 7 centimeter nonspecific soft tissue area  in the subcutaneous fat overlying the L4 vertebra to the right of midline.--more favoring hematoma  per neurosx-status post ultrasound guided drain of bloody fluid by IR on September 14th-- fluid culture negative  3. Acute on chronic kidney disease stage IV/with encephalopathy uremic mental status changes -avoid nephrotoxic ends -continue PO bicarb -hold lasix -creat rising 4.6--4.92--5.1-- 3.7--2.7 -making good urine -now on hemodialysis day two--- patient making good urine. Dr. Zollie [name] recommends remove perm cath. Patient will follow closely with [name] [name] outpatient  4. Acute on chronic anemia secondary to kidney disease, questionable hematoma in the back -will transfuse one unit today -came in with hemoglobin of 9.2--- 7.0-- one unit blood transfusion---7.7--7.6 -SCD - Epogen as out pt  5. Type II diabetes sliding [name] insulin,  Lantus and NovoLog three times a day with meals -patient will be discharged with insulin at home. DC glipizide due to elevated creatinine  6. Hyperlipidemia continue statins  Spoke with [name] [name] and updated today. Patient  will go home after removal of form perm cath today. D/w dr [name] [name] CONSULTS OBTAINED:  Treatment Team:  [name] Levine, [name] [name] Dana, [name]  DRUG ALLERGIES:   Allergies  Allergen Reactions  . 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 Doesn't believe he has an allergy to PCN as he has taken it previously, according to wife.  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 Doesn't believe he has an allergy to PCN as he has taken it previously, according to wife.    DISCHARGE MEDICATIONS:   Allergies as of 08/08/2019      Reactions   Unasyn [ampicillin-sulbactam Sodium] Anaphylaxis   Allergic interstitial nephritis to Unasyn/Zosyn in 2014. Doesn't believe he has an allergy to PCN as he has taken it previously, according to wife.   Zosyn [piperacillin Sod-tazobactam So] Anaphylaxis   Allergic interstitial nephritis to Unasyn/Zosyn in 2014. Doesn't believe he has an allergy to PCN as he has taken it previously, according to wife.      Medication List    STOP taking these medications   glimepiride 4 MG tablet Commonly known as: AMARYL   HYDROcodone-acetaminophen 5-325 MG tablet Commonly known as: NORCO/VICODIN   losartan 25 MG tablet Commonly known as: COZAAR   polyethylene glycol 17 g packet Commonly known as: MiraLax     TAKE these medications   aspirin EC 81 MG tablet Take 81 mg by mouth daily.   atorvastatin 40 MG tablet Commonly known as: LIPITOR Take 1 tablet (40 mg total) by mouth at bedtime.   carvedilol 25 MG tablet Commonly known as: COREG Take 1 tablet (25 mg total) by mouth 2 (two) times daily with a meal. What changed: when to take this   famotidine 20 MG tablet Commonly known as: PEPCID TAKE 1 TABLET BY MOUTH EVERY DAY What changed: when to take this   ferrous sulfate 325 (65 FE) MG tablet Take 325 mg by mouth  daily.   hydrALAZINE 25 MG tablet Commonly known as: APRESOLINE Take 1 tablet (25 mg total) by mouth 3 (three) times daily. What changed: when to take this   HYDROmorphone 2 MG tablet Commonly known as: Dilaudid Take 0.5 tablets (1 mg total) by mouth every 12 (twelve) hours as needed for up to 5 days for severe pain.   insulin aspart 100 UNIT/ML injection Commonly known as: novoLOG Inject 0-9 Units into the skin 3 (three) times daily with meals.   insulin detemir 100 UNIT/ML injection Commonly known as: LEVEMIR Inject 0.15 mLs (15 Units total) into the skin daily. Start taking on: August 09, 2019   isosorbide mononitrate 60 MG 24 hr tablet Commonly known as: IMDUR Take 0.5 tablets (30 mg total) by mouth daily.   multivitamin Tabs tablet Take 1 tablet by mouth at bedtime.   nitroGLYCERIN 0.4 MG SL tablet Commonly known as: Nitrostat Place 1 tablet (0.4 mg total) under the tongue every 5 (five) minutes as needed for chest pain.   Oxycodone HCl 10 MG Tabs Take 1 tablet (10 mg total) by mouth every 8 (eight) hours as needed for moderate pain or breakthrough pain.   sodium bicarbonate 650 MG tablet Take 1,300 mg by mouth 2 (two)  times daily.   torsemide 20 MG tablet Commonly known as: DEMADEX Take 2 tablets (40 mg total) by mouth daily. Start taking on: August 09, 2019   VITAMIN B 12 PO Take 1 tablet by mouth at bedtime.       If you experience worsening of your admission symptoms, develop shortness of breath, life threatening emergency, suicidal or homicidal thoughts you must seek medical attention immediately by calling 911 or calling your [name] immediately  if symptoms less severe.  You Must read complete instructions/literature along with all the possible adverse reactions/side effects for all the Medicines you take and that have been prescribed to you. Take any new Medicines after you have completely understood and accept all the possible adverse reactions/side  effects.   Please note  You were cared for by a hospitalist during your hospital stay. If you have any questions about your discharge medications or the care you received while you were in the hospital after you are discharged, you can call the unit and asked to speak with the hospitalist on call if the hospitalist that took care of you is not available. Once you are discharged, your primary care physician will handle any further medical issues. Please note that NO REFILLS for any discharge medications will be authorized once you are discharged, as it is imperative that you return to your primary care physician (or establish a relationship with a primary care physician if you do not have one) for your aftercare needs so that they can reassess your need for medications and monitor your lab values. Today   SUBJECTIVE   Overall well. He wants to go home today.  VITAL SIGNS:  Blood pressure (!) 160/76, pulse 74, temperature 98.2 F (36.8 C), temperature source Oral, resp. rate 18, height 6\' 1"  (1.854 m), weight 126.4 kg, SpO2 96 %.  I/O:    Intake/Output Summary (Last 24 hours) at 08/08/2019 1410 Last data filed at 08/08/2019 1240 Gross per 24 hour  Intake 600 ml  Output 1300 ml  Net -700 ml    PHYSICAL EXAMINATION:  GENERAL:  58 y.o.-year-old patient lying in the bed with no acute distress. obese EYES: Pupils equal, round, reactive to light and accommodation. No scleral icterus. Extraocular muscles intact.  HEENT: Head atraumatic, normocephalic. Oropharynx and nasopharynx clear.  NECK:  Supple, no jugular venous distention. No thyroid enlargement, no tenderness.  LUNGS: Normal breath sounds bilaterally, no wheezing, rales,rhonchi or crepitation. No use of accessory muscles of respiration.  CARDIOVASCULAR: S1, S2 normal. No murmurs, rubs, or gallops.  ABDOMEN: Soft, non-tender, non-distended. Bowel sounds present. No organomegaly or mass.  EXTREMITIES: No pedal edema, cyanosis, or  clubbing.  NEUROLOGIC: Cranial nerves II through XII are intact. Muscle strength 5/5 in all extremities. Sensation intact. Gait not checked.  PSYCHIATRIC: The patient is alert and oriented x 3.  SKIN: No obvious rash, lesion, or ulcer.   DATA REVIEW:   CBC  Recent Labs  Lab 08/02/19 0443  HGB 7.6*    Chemistries  Recent Labs  Lab 08/08/19 0835  NA 136  K 4.3  CL 99  CO2 27  GLUCOSE 304*  BUN 67*  CREATININE 3.33*  CALCIUM 8.0*    Microbiology Results   Recent Results (from the past 240 hour(s))  CULTURE, BLOOD (ROUTINE X 2) w Reflex to ID Panel     Status: None   Collection Time: 07/30/19  6:55 PM   Specimen: BLOOD  Result Value Ref Range Status   Specimen Description BLOOD  RIGHT ANTECUBITAL  Final   Special Requests   Final    BOTTLES DRAWN AEROBIC AND ANAEROBIC Blood Culture adequate volume   Culture   Final    NO GROWTH 5 DAYS Performed at [facility], [address] [place], [place] [address]    Report Status 08/04/2019 FINAL  Final  CULTURE, BLOOD (ROUTINE X 2) w Reflex to ID Panel     Status: None   Collection Time: 07/30/19  6:55 PM   Specimen: BLOOD  Result Value Ref Range Status   Specimen Description BLOOD RIGHT HAND  Final   Special Requests   Final    BOTTLES DRAWN AEROBIC AND ANAEROBIC Blood Culture adequate volume   Culture   Final    NO GROWTH 5 DAYS Performed at [facility], [address] [place], [place] [address]    Report Status 08/04/2019 FINAL  Final  Aerobic/Anaerobic Culture (surgical/deep wound)     Status: None   Collection Time: 07/31/19 11:35 AM   Specimen: Wound; Tissue  Result Value Ref Range Status   Specimen Description   Final    WOUND RIGHT LATERAL L4 SPINE Performed at [facility] [address] [place], [place] [address]    Special Requests   Final    Normal Performed at [facility], [place], [place] [address]    Gram Stain NO WBC SEEN NO ORGANISMS SEEN    Final   Culture   Final    No growth aerobically or anaerobically. Performed at [facility], [place] [address], [place], [place] [address]    Report Status 08/05/2019 FINAL  Final    RADIOLOGY:  No results found.   CODE STATUS:     Code Status Orders  (From admission, onward)         Start     Ordered   07/28/19 0240  Full code  Continuous     07/28/19 0239        Code Status History    Date Active Date Inactive Code Status Order ID Comments User Context   07/10/2019 1636 07/13/2019 1733 Full Code [id number]  [name], NP ED   10/04/2018 2014 10/07/2018 2157 Full Code [id number]  Vaughan Basta, [name] Inpatient   05/15/2014 1624 05/25/2014 1541 Full Code [id number]  Flora Lipps Inpatient   05/07/2014 1340 05/12/2014 0713 Full Code [id number]  Nani Skillern, PA-C Inpatient   Advance Care Planning Activity      TOTAL TIME TAKING CARE OF THIS PATIENT: *40* minutes.    Fritzi Mandes M.D on 08/08/2019 at 2:10 PM  Between 7am to 6pm - Pager - [phone] After 6pm go to [email or web address] - password [facility]  Office  [phone]  CC: Primary care physician; [name] Harrier, [name]

## 2019-08-08 NOTE — Progress Notes (Signed)
OT Cancellation Note  Patient Details Name: [name] [name] MRN: [id number] DOB: 12-21-1960   Cancelled Treatment:    Reason Eval/Treat Not Completed: Patient at procedure or test/ unavailable. Pt out of the room upon attempt. Will re-attempt OT tx at later date/time as appropriate.   Jeni Salles, MPH, MS, OTR/L ascom [phone] 08/08/19, 3:22 PM

## 2019-08-08 NOTE — Progress Notes (Signed)
[place], [place] 08/08/19  Subjective:  UOP recorded yesterday inaccurate per pt.  Cr currently 3.33. Have requested vascular surgery to remove permcath.   Objective:  Vital signs in last 24 hours:  Temp:  [98.2 F (36.8 C)-99 F (37.2 C)] 98.2 F (36.8 C) (09/22 0734) Pulse Rate:  [72-77] 74 (09/22 0734) Resp:  [18-20] 18 (09/22 0734) BP: (147-179)/(68-82) 160/76 (09/22 0734) SpO2:  [92 %-98 %] 96 % (09/22 0734) Weight:  [126.4 kg] 126.4 kg (09/22 0500)  Weight change: 0.872 kg Filed Weights   08/06/19 0359 08/07/19 0418 08/08/19 0500  Weight: 125.7 kg 125.5 kg 126.4 kg    Intake/Output:    Intake/Output Summary (Last 24 hours) at 08/08/2019 1158 Last data filed at 08/08/2019 1006 Gross per 24 hour  Intake 840 ml  Output 700 ml  Net 140 ml   Physical Exam: Gen:   Alert, cooperative, no distress Head:   Normocephalic, without obvious abnormality, atraumatic Eyes/ENT:  conjunctiva clear,  moist oral mucus membranes Neck:  Supple,   Lungs:   Clear to auscultation bilaterally, respirations unlabored Heart:   No rub  Abdomen:   Soft, non-tender  Extremities: no cyanosis , 1+ b/l edema in thighs and lower abdomen Skin:  Skin color, texture, turgor normal, no rashes or lesions Neurologic: Alert, able to answer questions today    Basic Metabolic Panel:  Recent Labs  Lab 08/02/19 0443 08/03/19 0445 08/04/19 0512 08/05/19 0511 08/07/19 0904 08/08/19 0835  NA 134* 133* 136 138 138 136  K 4.4 4.6 4.3 4.3 4.1 4.3  CL 99 100 103 104 102 99  CO2 24 22 22 24 29 27   GLUCOSE 164* 286* 242* 240* 254* 304*  BUN 91* 107* 97* 82* 58* 67*  CREATININE 4.92* 5.19* 4.87* 3.73* 2.94* 3.33*  CALCIUM 7.7* 7.6* 7.7* 7.6* 8.0* 8.0*  PHOS 6.8* 5.7* 5.6* 5.3*  --   --      CBC: Recent Labs  Lab 08/02/19 0443  HGB 7.6*      Lab Results  Component Value Date   HEPBSAG Negative 08/04/2019   HEPBSAB Non Reactive 08/04/2019       Microbiology:  Recent Results (from the past 240 hour(s))  CULTURE, BLOOD (ROUTINE X 2) w Reflex to ID Panel     Status: None   Collection Time: 07/30/19  6:55 PM   Specimen: BLOOD  Result Value Ref Range Status   Specimen Description BLOOD RIGHT ANTECUBITAL  Final   Special Requests   Final    BOTTLES DRAWN AEROBIC AND ANAEROBIC Blood Culture adequate volume   Culture   Final    NO GROWTH 5 DAYS Performed at [facility], [address] [place], [place] [address]    Report Status 08/04/2019 FINAL  Final  CULTURE, BLOOD (ROUTINE X 2) w Reflex to ID Panel     Status: None   Collection Time: 07/30/19  6:55 PM   Specimen: BLOOD  Result Value Ref Range Status   Specimen Description BLOOD RIGHT HAND  Final   Special Requests   Final    BOTTLES DRAWN AEROBIC AND ANAEROBIC Blood Culture adequate volume   Culture   Final    NO GROWTH 5 DAYS Performed at [facility], [address], [place], [place] [address]    Report Status 08/04/2019 FINAL  Final  Aerobic/Anaerobic Culture (surgical/deep wound)     Status: None   Collection Time: 07/31/19 11:35 AM   Specimen: Wound; Tissue  Result Value Ref Range Status   Specimen Description   Final    WOUND RIGHT LATERAL L4 SPINE Performed at [facility], [place] [address] [place], [place] [address]    Special Requests   Final    Normal Performed at [facility], [place], [place] [address]    Gram Stain NO WBC SEEN NO ORGANISMS SEEN   Final   Culture   Final    No growth aerobically or anaerobically. Performed at [facility], [place] [address] [place], [place] [address]    Report Status 08/05/2019 FINAL  Final    Coagulation Studies: No results for input(s): LABPROT, INR in the last 72 hours.  Urinalysis: No results for input(s): COLORURINE, LABSPEC, PHURINE, GLUCOSEU, HGBUR, BILIRUBINUR, KETONESUR, PROTEINUR, UROBILINOGEN, NITRITE, LEUKOCYTESUR in the last 72  hours.  Invalid input(s): APPERANCEUR    Imaging: No results found.   Medications:   . sodium chloride Stopped (08/01/19 0106)   . atorvastatin  40 mg Oral QHS  . carvedilol  25 mg Oral BID WC  . Chlorhexidine Gluconate Cloth  6 each Topical Q0600  . EPINEPHrine  0.3 mg Intramuscular Once  . epoetin (EPOGEN/PROCRIT) injection  10,000 Units Subcutaneous Weekly  . famotidine  20 mg Oral QHS  . feeding supplement (NEPRO CARB STEADY)  237 mL Oral TID BM  . ferrous sulfate  325 mg Oral Daily  . gabapentin  100 mg Oral TID  . hydrALAZINE  25 mg Oral TID  . insulin aspart  0-5 Units Subcutaneous QHS  . insulin aspart  0-9 Units Subcutaneous TID WC  . insulin aspart  2 Units Subcutaneous TID WC  . insulin detemir  10 Units Subcutaneous Daily  . isosorbide mononitrate  30 mg Oral Daily  . lidocaine  1 patch Transdermal Q24H  . multivitamin  1 tablet Oral QHS  . polyethylene glycol  17 g Oral Daily  . senna-docusate  1 tablet Oral BID  . sodium bicarbonate  1,300 mg Oral BID  . sodium chloride flush  10 mL Intravenous Q12H  . torsemide  40 mg Oral Daily   sodium chloride, acetaminophen **OR** acetaminophen, heparin, HYDROmorphone (DILAUDID) injection, magnesium hydroxide, nitroGLYCERIN, ondansetron **OR** ondansetron (ZOFRAN) IV, ondansetron (ZOFRAN) IV, oxyCODONE  Assessment/ Plan:  58 y.o. male with  hypertension, diabetes mellitus type II, diabetic neuropathy, systolic congestive heart failure, peripheral vascular disease status post left toe amputations, autonomic neuropathy, coronary artery disease status post CABG, history of osteomyelitis, who has been admitted to [facility] on 07/27/2019 for acute exacerbation of congestive heart failure.   Active Problems:   Acute on chronic combined systolic and diastolic CHF (congestive heart failure) (HCC)   Acute on chronic respiratory failure with hypoxemia (HCC)   Acute midline low back pain without sciatica   #.AKI on  CKD st 4 Recent  Labs    08/04/19 0512 08/05/19 0511 08/07/19 0904 08/08/19 0835  CREATININE 4.87* 3.73* 2.94* 3.33*  Baseline Cr 2.42 from April 2020/ GFR 29. Cause of AKI is unclear at present, but likely cardiorenal & ATN from excessive diuresis  -UOP recorded yesterday apparently not accurate.  Will plan to remove permcath today as renal parameters have improved.     # LE edema, Chronic sys CHF Appears well compensated from cardiac perspective at the moment.    #. Anemia of CKD  Lab Results  Component Value Date   HGB 7.6 (L) 08/02/2019  Consider epogen as outpt.   #. SHPTH  No results found for: PTH Lab Results  Component Value Date   PHOS 5.3 (H) 08/05/2019  Most recent serum phosphorus level was 5.3.  Continue to monitor.   #. Diabetes type 2 with CKD Glycemic control as per hospitalist.  #  Back pain Surgical team is following along.  No indication for I&D   LOS: 11 Coretta Leisey 9/22/202011:58 AM  [place], [place]

## 2019-08-08 NOTE — Discharge Instructions (Signed)
Keep log of sugars at home 

## 2019-08-09 ENCOUNTER — Ambulatory Visit: Payer: Medicare HMO | Admitting: Family

## 2019-08-09 ENCOUNTER — Encounter: Payer: Self-pay | Admitting: Vascular Surgery

## 2019-08-11 DIAGNOSIS — E1122 Type 2 diabetes mellitus with diabetic chronic kidney disease: Secondary | ICD-10-CM | POA: Diagnosis not present

## 2019-08-11 DIAGNOSIS — D631 Anemia in chronic kidney disease: Secondary | ICD-10-CM | POA: Diagnosis not present

## 2019-08-11 DIAGNOSIS — I2510 Atherosclerotic heart disease of native coronary artery without angina pectoris: Secondary | ICD-10-CM | POA: Diagnosis not present

## 2019-08-11 DIAGNOSIS — I5023 Acute on chronic systolic (congestive) heart failure: Secondary | ICD-10-CM | POA: Diagnosis not present

## 2019-08-11 DIAGNOSIS — N184 Chronic kidney disease, stage 4 (severe): Secondary | ICD-10-CM | POA: Diagnosis not present

## 2019-08-11 DIAGNOSIS — E1151 Type 2 diabetes mellitus with diabetic peripheral angiopathy without gangrene: Secondary | ICD-10-CM | POA: Diagnosis not present

## 2019-08-11 DIAGNOSIS — E1140 Type 2 diabetes mellitus with diabetic neuropathy, unspecified: Secondary | ICD-10-CM | POA: Diagnosis not present

## 2019-08-11 DIAGNOSIS — I130 Hypertensive heart and chronic kidney disease with heart failure and stage 1 through stage 4 chronic kidney disease, or unspecified chronic kidney disease: Secondary | ICD-10-CM | POA: Diagnosis not present

## 2019-08-11 DIAGNOSIS — I255 Ischemic cardiomyopathy: Secondary | ICD-10-CM | POA: Diagnosis not present

## 2019-08-14 ENCOUNTER — Emergency Department: Payer: Medicare HMO

## 2019-08-14 ENCOUNTER — Inpatient Hospital Stay
Admission: EM | Admit: 2019-08-14 | Discharge: 2019-08-18 | DRG: 291 | Disposition: A | Discharge status: Home / Self Care | Payer: Medicare HMO | Attending: Internal Medicine | Admitting: Internal Medicine

## 2019-08-14 ENCOUNTER — Other Ambulatory Visit: Payer: Self-pay

## 2019-08-14 ENCOUNTER — Encounter: Payer: Self-pay | Admitting: Emergency Medicine

## 2019-08-14 DIAGNOSIS — J9601 Acute respiratory failure with hypoxia: Secondary | ICD-10-CM | POA: Diagnosis not present

## 2019-08-14 DIAGNOSIS — R0902 Hypoxemia: Secondary | ICD-10-CM | POA: Diagnosis present

## 2019-08-14 DIAGNOSIS — R55 Syncope and collapse: Secondary | ICD-10-CM

## 2019-08-14 DIAGNOSIS — Z794 Long term (current) use of insulin: Secondary | ICD-10-CM | POA: Diagnosis not present

## 2019-08-14 DIAGNOSIS — Z951 Presence of aortocoronary bypass graft: Secondary | ICD-10-CM

## 2019-08-14 DIAGNOSIS — I10 Essential (primary) hypertension: Secondary | ICD-10-CM | POA: Diagnosis not present

## 2019-08-14 DIAGNOSIS — Z881 Allergy status to other antibiotic agents status: Secondary | ICD-10-CM

## 2019-08-14 DIAGNOSIS — E1140 Type 2 diabetes mellitus with diabetic neuropathy, unspecified: Secondary | ICD-10-CM | POA: Diagnosis not present

## 2019-08-14 DIAGNOSIS — I5023 Acute on chronic systolic (congestive) heart failure: Secondary | ICD-10-CM | POA: Diagnosis present

## 2019-08-14 DIAGNOSIS — Z89422 Acquired absence of other left toe(s): Secondary | ICD-10-CM | POA: Diagnosis not present

## 2019-08-14 DIAGNOSIS — E118 Type 2 diabetes mellitus with unspecified complications: Secondary | ICD-10-CM | POA: Diagnosis present

## 2019-08-14 DIAGNOSIS — I493 Ventricular premature depolarization: Secondary | ICD-10-CM | POA: Diagnosis not present

## 2019-08-14 DIAGNOSIS — E1151 Type 2 diabetes mellitus with diabetic peripheral angiopathy without gangrene: Secondary | ICD-10-CM | POA: Diagnosis present

## 2019-08-14 DIAGNOSIS — R069 Unspecified abnormalities of breathing: Secondary | ICD-10-CM | POA: Diagnosis not present

## 2019-08-14 DIAGNOSIS — I255 Ischemic cardiomyopathy: Secondary | ICD-10-CM | POA: Diagnosis not present

## 2019-08-14 DIAGNOSIS — Z20828 Contact with and (suspected) exposure to other viral communicable diseases: Secondary | ICD-10-CM | POA: Diagnosis not present

## 2019-08-14 DIAGNOSIS — I2510 Atherosclerotic heart disease of native coronary artery without angina pectoris: Secondary | ICD-10-CM | POA: Diagnosis not present

## 2019-08-14 DIAGNOSIS — N185 Chronic kidney disease, stage 5: Secondary | ICD-10-CM | POA: Diagnosis present

## 2019-08-14 DIAGNOSIS — E785 Hyperlipidemia, unspecified: Secondary | ICD-10-CM | POA: Diagnosis present

## 2019-08-14 DIAGNOSIS — I4729 Other ventricular tachycardia: Secondary | ICD-10-CM

## 2019-08-14 DIAGNOSIS — I501 Left ventricular failure: Secondary | ICD-10-CM | POA: Diagnosis not present

## 2019-08-14 DIAGNOSIS — D631 Anemia in chronic kidney disease: Secondary | ICD-10-CM | POA: Diagnosis not present

## 2019-08-14 DIAGNOSIS — E872 Acidosis: Secondary | ICD-10-CM | POA: Diagnosis not present

## 2019-08-14 DIAGNOSIS — E119 Type 2 diabetes mellitus without complications: Secondary | ICD-10-CM | POA: Diagnosis not present

## 2019-08-14 DIAGNOSIS — Z7982 Long term (current) use of aspirin: Secondary | ICD-10-CM

## 2019-08-14 DIAGNOSIS — N184 Chronic kidney disease, stage 4 (severe): Secondary | ICD-10-CM | POA: Diagnosis not present

## 2019-08-14 DIAGNOSIS — I132 Hypertensive heart and chronic kidney disease with heart failure and with stage 5 chronic kidney disease, or end stage renal disease: Secondary | ICD-10-CM | POA: Diagnosis not present

## 2019-08-14 DIAGNOSIS — E1143 Type 2 diabetes mellitus with diabetic autonomic (poly)neuropathy: Secondary | ICD-10-CM | POA: Diagnosis present

## 2019-08-14 DIAGNOSIS — D509 Iron deficiency anemia, unspecified: Secondary | ICD-10-CM | POA: Diagnosis present

## 2019-08-14 DIAGNOSIS — I129 Hypertensive chronic kidney disease with stage 1 through stage 4 chronic kidney disease, or unspecified chronic kidney disease: Secondary | ICD-10-CM | POA: Diagnosis not present

## 2019-08-14 DIAGNOSIS — I252 Old myocardial infarction: Secondary | ICD-10-CM

## 2019-08-14 DIAGNOSIS — E1165 Type 2 diabetes mellitus with hyperglycemia: Secondary | ICD-10-CM | POA: Diagnosis present

## 2019-08-14 DIAGNOSIS — I509 Heart failure, unspecified: Secondary | ICD-10-CM | POA: Diagnosis not present

## 2019-08-14 DIAGNOSIS — Z9114 Patient's other noncompliance with medication regimen: Secondary | ICD-10-CM

## 2019-08-14 DIAGNOSIS — Z79899 Other long term (current) drug therapy: Secondary | ICD-10-CM

## 2019-08-14 DIAGNOSIS — I130 Hypertensive heart and chronic kidney disease with heart failure and stage 1 through stage 4 chronic kidney disease, or unspecified chronic kidney disease: Secondary | ICD-10-CM | POA: Diagnosis not present

## 2019-08-14 DIAGNOSIS — R0602 Shortness of breath: Secondary | ICD-10-CM | POA: Diagnosis not present

## 2019-08-14 DIAGNOSIS — E1122 Type 2 diabetes mellitus with diabetic chronic kidney disease: Secondary | ICD-10-CM | POA: Diagnosis not present

## 2019-08-14 DIAGNOSIS — I472 Ventricular tachycardia: Secondary | ICD-10-CM | POA: Diagnosis not present

## 2019-08-14 DIAGNOSIS — Z833 Family history of diabetes mellitus: Secondary | ICD-10-CM

## 2019-08-14 DIAGNOSIS — I428 Other cardiomyopathies: Secondary | ICD-10-CM | POA: Diagnosis not present

## 2019-08-14 DIAGNOSIS — I5022 Chronic systolic (congestive) heart failure: Secondary | ICD-10-CM | POA: Diagnosis not present

## 2019-08-14 DIAGNOSIS — N179 Acute kidney failure, unspecified: Secondary | ICD-10-CM | POA: Diagnosis present

## 2019-08-14 DIAGNOSIS — Z8249 Family history of ischemic heart disease and other diseases of the circulatory system: Secondary | ICD-10-CM

## 2019-08-14 DIAGNOSIS — I25118 Atherosclerotic heart disease of native coronary artery with other forms of angina pectoris: Secondary | ICD-10-CM | POA: Diagnosis not present

## 2019-08-14 DIAGNOSIS — R0600 Dyspnea, unspecified: Secondary | ICD-10-CM | POA: Diagnosis not present

## 2019-08-14 DIAGNOSIS — R0609 Other forms of dyspnea: Secondary | ICD-10-CM

## 2019-08-14 DIAGNOSIS — I959 Hypotension, unspecified: Secondary | ICD-10-CM | POA: Diagnosis not present

## 2019-08-14 LAB — URINALYSIS, COMPLETE (UACMP) WITH MICROSCOPIC
Bacteria, UA: NONE SEEN
Bilirubin Urine: NEGATIVE
Glucose, UA: >=500 mg/dL — AB
Hgb urine dipstick: NEGATIVE
Ketones, ur: NEGATIVE mg/dL
Leukocytes,Ua: NEGATIVE
Nitrite: NEGATIVE
Protein, ur: 100 mg/dL — AB
Specific Gravity, Urine: 1.011 (ref 1.005–1.030)
pH: 7 (ref 5.0–8.0)

## 2019-08-14 LAB — BASIC METABOLIC PANEL
Anion gap: 12 (ref 5–15)
BUN: 64 mg/dL — ABNORMAL HIGH (ref 6–20)
CO2: 26 mmol/L (ref 22–32)
Calcium: 8.4 mg/dL — ABNORMAL LOW (ref 8.9–10.3)
Chloride: 100 mmol/L (ref 98–111)
Creatinine, Ser: 3.65 mg/dL — ABNORMAL HIGH (ref 0.61–1.24)
GFR calc Af Amer: 20 mL/min — ABNORMAL LOW (ref >60)
GFR calc non Af Amer: 18 mL/min — ABNORMAL LOW (ref >60)
Glucose, Bld: 270 mg/dL — ABNORMAL HIGH (ref 70–99)
Potassium: 4.1 mmol/L (ref 3.5–5.1)
Sodium: 138 mmol/L (ref 135–145)

## 2019-08-14 LAB — BRAIN NATRIURETIC PEPTIDE: B Natriuretic Peptide: 2227 pg/mL — ABNORMAL HIGH (ref 0.0–100.0)

## 2019-08-14 LAB — MAGNESIUM: Magnesium: 2.2 mg/dL (ref 1.7–2.4)

## 2019-08-14 LAB — CBC
HCT: 25 % — ABNORMAL LOW (ref 39.0–52.0)
Hemoglobin: 7.8 g/dL — ABNORMAL LOW (ref 13.0–17.0)
MCH: 29.4 pg (ref 26.0–34.0)
MCHC: 31.2 g/dL (ref 30.0–36.0)
MCV: 94.3 fL (ref 80.0–100.0)
Platelets: 337 10*3/uL (ref 150–400)
RBC: 2.65 MIL/uL — ABNORMAL LOW (ref 4.22–5.81)
RDW: 15.3 % (ref 11.5–15.5)
WBC: 9.1 10*3/uL (ref 4.0–10.5)
nRBC: 0 % (ref 0.0–0.2)

## 2019-08-14 LAB — TROPONIN I (HIGH SENSITIVITY): Troponin I (High Sensitivity): 28 ng/L — ABNORMAL HIGH (ref <18)

## 2019-08-14 LAB — GLUCOSE, CAPILLARY: Glucose-Capillary: 258 mg/dL — ABNORMAL HIGH (ref 70–99)

## 2019-08-14 LAB — PHOSPHORUS: Phosphorus: 4.3 mg/dL (ref 2.5–4.6)

## 2019-08-14 MED ORDER — HEPARIN SODIUM (PORCINE) 5000 UNIT/ML IJ SOLN
5000.0000 [IU] | Freq: Three times a day (TID) | INTRAMUSCULAR | Status: DC
  Administered 2019-08-15 – 2019-08-18 (×11): 5000 [IU] via SUBCUTANEOUS
  Filled 2019-08-14 (×11): qty 1

## 2019-08-14 MED ORDER — CARVEDILOL 25 MG PO TABS
25.0000 mg | ORAL_TABLET | Freq: Every day | ORAL | Status: DC
  Administered 2019-08-15: 25 mg via ORAL
  Filled 2019-08-14: qty 1

## 2019-08-14 MED ORDER — NITROGLYCERIN 0.4 MG SL SUBL: 0.4000 mg | SUBLINGUAL_TABLET | SUBLINGUAL | Status: DC | PRN

## 2019-08-14 MED ORDER — SODIUM CHLORIDE 0.9 % IV SOLN: 250.0000 mL | INTRAVENOUS | Status: DC | PRN

## 2019-08-14 MED ORDER — FUROSEMIDE 10 MG/ML IJ SOLN
40.0000 mg | Freq: Once | INTRAMUSCULAR | Status: DC
  Filled 2019-08-14: qty 4

## 2019-08-14 MED ORDER — ACETAMINOPHEN 325 MG PO TABS: 650.0000 mg | ORAL_TABLET | ORAL | Status: DC | PRN

## 2019-08-14 MED ORDER — HYDRALAZINE HCL 50 MG PO TABS
25.0000 mg | ORAL_TABLET | Freq: Three times a day (TID) | ORAL | Status: DC
  Administered 2019-08-15 – 2019-08-17 (×7): 25 mg via ORAL
  Filled 2019-08-14 (×7): qty 1

## 2019-08-14 MED ORDER — RENA-VITE PO TABS
1.0000 | ORAL_TABLET | Freq: Every day | ORAL | Status: DC
  Administered 2019-08-15 – 2019-08-17 (×3): 1 via ORAL
  Filled 2019-08-14 (×5): qty 1

## 2019-08-14 MED ORDER — CARVEDILOL 25 MG PO TABS
25.0000 mg | ORAL_TABLET | Freq: Once | ORAL | Status: AC
  Administered 2019-08-14: 23:00:00 25 mg via ORAL

## 2019-08-14 MED ORDER — HYDROMORPHONE HCL 2 MG PO TABS: 1.0000 mg | ORAL_TABLET | Freq: Two times a day (BID) | ORAL | Status: DC | PRN

## 2019-08-14 MED ORDER — HYDRALAZINE HCL 25 MG PO TABS
25.0000 mg | ORAL_TABLET | Freq: Once | ORAL | Status: AC
  Administered 2019-08-14: 25 mg via ORAL

## 2019-08-14 MED ORDER — HYDRALAZINE HCL 50 MG PO TABS
25.0000 mg | ORAL_TABLET | Freq: Once | ORAL | Status: AC
  Administered 2019-08-14: 25 mg via ORAL

## 2019-08-14 MED ORDER — ATORVASTATIN CALCIUM 20 MG PO TABS
40.0000 mg | ORAL_TABLET | Freq: Every day | ORAL | Status: DC
  Administered 2019-08-15 – 2019-08-17 (×4): 40 mg via ORAL
  Filled 2019-08-14 (×4): qty 2

## 2019-08-14 MED ORDER — FAMOTIDINE 20 MG PO TABS
20.0000 mg | ORAL_TABLET | Freq: Every day | ORAL | Status: DC
  Administered 2019-08-15 – 2019-08-17 (×4): 20 mg via ORAL
  Filled 2019-08-14 (×4): qty 1

## 2019-08-14 MED ORDER — SODIUM CHLORIDE 0.9% FLUSH: 3.0000 mL | INTRAVENOUS | Status: DC | PRN

## 2019-08-14 MED ORDER — FUROSEMIDE 10 MG/ML IJ SOLN: 40.0000 mg | Freq: Two times a day (BID) | INTRAMUSCULAR | Status: DC

## 2019-08-14 MED ORDER — ISOSORBIDE MONONITRATE ER 30 MG PO TB24
30.0000 mg | ORAL_TABLET | Freq: Every day | ORAL | Status: DC
  Administered 2019-08-15 – 2019-08-17 (×3): 30 mg via ORAL
  Filled 2019-08-14 (×3): qty 1

## 2019-08-14 MED ORDER — OXYCODONE HCL 5 MG PO TABS: 10.0000 mg | ORAL_TABLET | Freq: Three times a day (TID) | ORAL | Status: DC | PRN

## 2019-08-14 MED ORDER — SODIUM BICARBONATE 650 MG PO TABS
1300.0000 mg | ORAL_TABLET | Freq: Two times a day (BID) | ORAL | Status: DC
  Administered 2019-08-15 – 2019-08-18 (×8): 1300 mg via ORAL
  Filled 2019-08-14 (×8): qty 2

## 2019-08-14 MED ORDER — ASPIRIN EC 81 MG PO TBEC
81.0000 mg | DELAYED_RELEASE_TABLET | Freq: Every day | ORAL | Status: DC
  Administered 2019-08-15 – 2019-08-18 (×4): 81 mg via ORAL
  Filled 2019-08-14 (×4): qty 1

## 2019-08-14 MED ORDER — ONDANSETRON HCL 4 MG/2ML IJ SOLN: 4.0000 mg | Freq: Four times a day (QID) | INTRAMUSCULAR | Status: DC | PRN

## 2019-08-14 MED ORDER — INSULIN DETEMIR 100 UNIT/ML ~~LOC~~ SOLN
15.0000 [IU] | Freq: Every day | SUBCUTANEOUS | Status: DC
  Administered 2019-08-15 – 2019-08-18 (×4): 15 [IU] via SUBCUTANEOUS
  Filled 2019-08-14 (×4): qty 0.15

## 2019-08-14 MED ORDER — HYDRALAZINE HCL 50 MG PO TABS
25.0000 mg | ORAL_TABLET | Freq: Once | ORAL | Status: DC
  Filled 2019-08-14: qty 1

## 2019-08-14 MED ORDER — FERROUS SULFATE 325 (65 FE) MG PO TABS
325.0000 mg | ORAL_TABLET | Freq: Every day | ORAL | Status: DC
  Administered 2019-08-15 – 2019-08-18 (×4): 325 mg via ORAL
  Filled 2019-08-14 (×4): qty 1

## 2019-08-14 NOTE — ED Notes (Signed)
Report given to Lexie, RN 

## 2019-08-14 NOTE — ED Notes (Signed)
Pt's wife called out; concerned that bp increased to 171/87 "and he wasn't even doing anything." Discussed the "snapshot" illustration with her and she said it has never done that before except in past month, when changes have been made in his htn medication. She wanted it noted and addressed.

## 2019-08-14 NOTE — ED Notes (Signed)
Patient's wife refusing lasix and meds at this time, would like to speak with MD; states patient's kidneys cannot handle lasix. MD notified

## 2019-08-14 NOTE — ED Triage Notes (Signed)
Pt comes into ED via EMS from home with c/o elevater CBG and HTN,. Denies any sx. CBG 234, b/p 177/101.[name] [name]

## 2019-08-14 NOTE — ED Triage Notes (Signed)
Pt here via EMS from home with c/o hyperglycemia, hypertension, low oxygen, episodes of near syncope. Pt was admitted to [facility] a few weeks ago, ended up having to get dialysis due to multiple medical complaints, last treatment was a week ago. Home care nurse states pt needs home O2. Sats in triage 94% on RA. Pt denies SHOB.

## 2019-08-14 NOTE — ED Notes (Signed)
Pt presents with c/o syncopal episode this morning. Wife states that he has had two near syncopal episodes last Wednesday and Friday, and then passed out this morning for "a very short time." Pt's wife states that his home health nurse came over and said that pt's O2 sat was 83% and increased to 87%. She then called dr, who wanted him to come to hospital via ems. Pt o2 sat is 91% during assessment.

## 2019-08-14 NOTE — ED Notes (Addendum)
Verified that lab has tubes to run the requested blood tests.

## 2019-08-14 NOTE — ED Provider Notes (Signed)
[facility] Emergency Department Provider Note  ____________________________________________   First MD Initiated Contact with Patient 08/14/19 1622     (approximate)  I have reviewed the triage vital signs and the nursing notes.  History  Chief Complaint Hyperglycemia, Hypertension, and Near Syncope    HPI [name] [name] is a 58 y.o. male with a history of CAD status post CABG, HFrEF due to ICM (30-35%), peripheral vascular disease, poorly controlled insulin-dependent diabetic, CKD, hypertension, hyperlipidemia who presents to the emergency department for a syncopal episode, along with significant dyspnea on exertion and generalized weakness.  Per wife at bedside, the patient has been unable to ambulate any significant distance without becoming significantly short of breath.  Today when he walked between rooms, he became so short of breath that when he sat down, he lost consciousness briefly.  Wife denies any shaking or seizure-like activity.  He regained consciousness quickly.  Wife at bedside states home health nurse came to check on him, and reported that his oxygen was in the low 80s on room air. He does not normally wear oxygen at home.  The patient himself does report feeling short of breath, he denies any chest pain, palpitations, fevers, nausea, vomiting, diarrhea, dysuria.  He feels he is not having as robust response to his oral diuretics.  Of note, he was recently admitted here from 9/10 to 9/22 for acute on chronic heart failure, CKD, requiring significant diuresis and briefly required hemodialysis.   Past Medical Hx Past Medical History:  Diagnosis Date  . CAD (coronary artery disease)    a. 04/2014 CABG x 4 (LIMA->LAD, VG->Diag, VG->OM, VG->PDA).  . Chronic systolic CHF (congestive heart failure) ([place])    a. 07/2014 Echo: EF 30-35%.  . CKD (chronic kidney disease), stage III ([place])    a. 12/2015 Creat 1.8.  . Diabetic foot ulcers ([place])    a.  left foot 2nd digit ant 5 th digit 05/03/14; b. 08/2014 s/p amputation of toes on L foot.  . Hypercholesterolemia    a. 12/2015 TC 116, TG 154, HDL 24, LDL 61-->Atorvastatin 40.  Marland Kitchen Hypertension   . Hypertensive heart disease   . Ischemic cardiomyopathy    a. 07/2013 Echo: EF 20%; b. 07/2014 Echo: EF 30-35%, diff HK, Gr1 DD, mildly dil LA, nl PASP.  Marland Kitchen Myocardial infarction ([place])   . Neuropathy in diabetes ([place])   . Open wound    foot  . Orthostatic hypotension   . Peripheral vascular disease ([place])   . Type II diabetes mellitus ([place])    a. 12/2015 HbA1c = 13.9.    Problem List Patient Active Problem List   Diagnosis Date Noted  . Acute midline low back pain without sciatica   . Acute on chronic respiratory failure with hypoxemia ([place]) 07/28/2019  . Acute renal failure (ARF) ([place]) 07/12/2019  . Acute renal failure superimposed on stage 4 chronic kidney disease ([place]) 07/10/2019  . NSVT (nonsustained ventricular tachycardia) ([place]) 11/22/2018  . Chronic systolic heart failure ([place]) 10/12/2018  . HTN (hypertension) 10/12/2018  . Preop cardiovascular exam 12/10/2016  . Hypertensive heart disease   . Ischemic cardiomyopathy   . Hypercholesterolemia   . CKD (chronic kidney disease), stage III ([place])   . Acute on chronic combined systolic and diastolic CHF (congestive heart failure) ([place]) 09/19/2014  . Hyperlipidemia 07/31/2014  . Orthostatic hypotension 06/15/2014  . Chronic kidney disease 05/21/2014  . Chronic osteomyelitis of toe of left foot ([place]) 05/21/2014  . Diabetic ulcer of  left foot ([place]) 05/21/2014  . Physical deconditioning 05/15/2014  . S/P CABG x 4 05/13/2014  . Diabetes mellitus type 2 with complications ([place]) [id number]/[id number]  . CAD (coronary artery disease)     Past Surgical Hx Past Surgical History:  Procedure Laterality Date  . ACHILLES TENDON SURGERY Right 01/22/2017   Procedure: ACHILLES LENGTHENING/KIDNER/TAL/Teno achilles lengthening;  Surgeon: Samara Deist, DPM;   Location: [facility] ORS;  Service: Podiatry;  Laterality: Right;  . CARDIAC CATHETERIZATION     03/2014  . CATARACT EXTRACTION    . CORONARY ARTERY BYPASS GRAFT N/A 05/07/2014   Procedure: CORONARY ARTERY BYPASS GRAFTING (CABG);  Surgeon: Gaye Pollack, MD;  Location: [facility];  Service: Open Heart Surgery;  Laterality: N/A;  Times 4 using left internal mammary artery and endoscopically harvested right saphenous vein  . DIALYSIS/PERMA CATHETER INSERTION N/A 08/04/2019   Procedure: DIALYSIS/PERMA CATHETER INSERTION;  Surgeon: Katha Cabal, MD;  Location: [facility];  Service: Cardiovascular;  Laterality: N/A;  . DIALYSIS/PERMA CATHETER REMOVAL N/A 08/08/2019   Procedure: DIALYSIS/PERMA CATHETER REMOVAL;  Surgeon: Katha Cabal, MD;  Location: [facility];  Service: Cardiovascular;  Laterality: N/A;  . INTRAOPERATIVE TRANSESOPHAGEAL ECHOCARDIOGRAM N/A 05/07/2014   Procedure: INTRAOPERATIVE TRANSESOPHAGEAL ECHOCARDIOGRAM;  Surgeon: Gaye Pollack, MD;  Location: [facility];  Service: Open Heart Surgery;  Laterality: N/A;  . left foot osteomyelitis and wound after surgery    . TOE AMPUTATION     Left 3 and 4 toes  . TONSILECTOMY/ADENOIDECTOMY WITH MYRINGOTOMY      Medications Prior to Admission medications   Medication Sig Start Date End Date Taking? Authorizing Provider  aspirin EC 81 MG tablet Take 81 mg by mouth daily.    [provider]  atorvastatin (LIPITOR) 40 MG tablet Take 1 tablet (40 mg total) by mouth at bedtime. 11/23/18   Minna Merritts, MD  carvedilol (COREG) 25 MG tablet Take 1 tablet (25 mg total) by mouth 2 (two) times daily with a meal. Patient taking differently: Take 25 mg by mouth at bedtime.  11/23/18   Minna Merritts, MD  Cyanocobalamin (VITAMIN B 12 PO) Take 1 tablet by mouth at bedtime.     [provider]  famotidine (PEPCID) 20 MG tablet TAKE 1 TABLET BY MOUTH EVERY DAY Patient taking differently: Take 20 mg by mouth at bedtime.   07/18/19   Virgel Manifold, MD  ferrous sulfate 325 (65 FE) MG tablet Take 325 mg by mouth daily.    [provider]  hydrALAZINE (APRESOLINE) 25 MG tablet Take 1 tablet (25 mg total) by mouth 3 (three) times daily. 08/08/19   Fritzi Mandes, MD  insulin aspart (NOVOLOG) 100 UNIT/ML injection Inject 0-9 Units into the skin 3 (three) times daily with meals. 08/08/19   Fritzi Mandes, MD  insulin detemir (LEVEMIR) 100 UNIT/ML injection Inject 0.15 mLs (15 Units total) into the skin daily. 08/09/19   Fritzi Mandes, MD  isosorbide mononitrate (IMDUR) 60 MG 24 hr tablet Take 0.5 tablets (30 mg total) by mouth daily. 07/06/19   Rise Mu, PA-C  multivitamin (RENA-VIT) TABS tablet Take 1 tablet by mouth at bedtime. 08/08/19   Fritzi Mandes, MD  nitroGLYCERIN (NITROSTAT) 0.4 MG SL tablet Place 1 tablet (0.4 mg total) under the tongue every 5 (five) minutes as needed for chest pain. 12/24/15   Theora Gianotti, NP  oxyCODONE 10 MG TABS Take 1 tablet (10 mg total) by mouth every 8 (eight) hours as needed  for moderate pain or breakthrough pain. 08/08/19   Fritzi Mandes, MD  sodium bicarbonate 650 MG tablet Take 1,300 mg by mouth 2 (two) times daily.  03/01/19   [provider]  torsemide (DEMADEX) 20 MG tablet Take 2 tablets (40 mg total) by mouth daily. 08/09/19   Fritzi Mandes, MD    Allergies Unasyn [ampicillin-sulbactam sodium] and Zosyn [piperacillin sod-tazobactam so]  Family Hx Family History  Problem Relation Age of Onset  . Heart disease Father        CABG in his 71's  . Diabetes type II Other   . Kidney disease Other   . Hypertension Other   . Ovarian cancer Mother     Social Hx Social History   Tobacco Use  . Smoking status: Never Smoker  . Smokeless tobacco: Never Used  Substance Use Topics  . Alcohol use: No  . Drug use: No     Review of Systems  Constitutional: Negative for fever, chills. Eyes: Negative for visual changes. ENT: Negative for sore throat.  Cardiovascular: Negative for chest pain. Respiratory: + for shortness of breath. Gastrointestinal: Negative for nausea, vomiting.  Genitourinary: Negative for dysuria. Musculoskeletal: Negative for leg swelling. Skin: Negative for rash. Neurological: + LOC   Physical Exam  Vital Signs: ED Triage Vitals  Enc Vitals Group     BP 08/14/19 1416 (!) 175/81     Pulse Rate 08/14/19 1416 77     Resp 08/14/19 1416 18     Temp 08/14/19 1416 98.5 F (36.9 C)     Temp Source 08/14/19 1416 Oral     SpO2 08/14/19 1416 94 %     Weight 08/14/19 1417 265 lb (120.2 kg)     Height 08/14/19 1417 6\' 1"  (1.854 m)     Head Circumference --      Peak Flow --      Pain Score 08/14/19 1417 0     Pain Loc --      Pain Edu? --      Excl. in [place]? --     Constitutional: Alert and oriented.  Chronically ill-appearing.  Appears older than stated age. Head: Normocephalic. Atraumatic. Eyes: Conjunctivae clear. Sclera anicteric. Nose: No congestion. No rhinorrhea. Mouth/Throat: Mucous membranes are moist.  Neck: No stridor.   Cardiovascular: Normal rate, regular rhythm. Extremities well perfused. Respiratory: Normal respiratory effort.  Lungs clear anteriorly, difficult to auscultate due to body habitus.  Oxygen low 90s on room air. Gastrointestinal: Soft. Non-tender. Non-distended.  Musculoskeletal: Bilateral, symmetric pitting edema to the mid shins, compression stockings in place. Neurologic:  Normal speech and language. No gross focal neurologic deficits are appreciated.  Skin: Skin is warm, dry and intact. No rash noted. Psychiatric: Mood and affect are appropriate for situation.  EKG  Personally reviewed.   Rate: 79 Rhythm: sinus Axis: normal Intervals: WNL TWI in I, aVL, ST abnormality III, aVF, seen on prior Abnormal EKG, largely unchanged from prior No STEMI    Radiology  XR: IMPRESSION: Mild cardiomegaly. Status post coronary artery bypass grafting. No evident edema or  consolidation.     Procedures  Procedure(s) performed (including critical care):  Procedures   Initial Impression / Assessment and Plan / ED Course  58 y.o. male who presents to the ED for shortness of breath, significant dyspnea on exertion, syncope, as above.  Ddx: HF exacerbation, pulmonary infection, fluid overload 2/2 CKD status, deconditioning, amongst others  Plan: labs, EKG, imaging  X-ray negative, however BNP is significantly elevated.  Creatinine slightly worse from prior, hemoglobin is stable.  On ambulatory test, patient desaturated to 78% on room air with good pleth.  Placed on 4 L nasal cannula.  Will discuss with hospitalist for admission for further management, possible nephrology/cardiology consult, likely careful diuresis. EKG abnormal, but largely unchanged from prior. Slightly elevated HS troponin, improved from prior. Patient and wife agreeable with plan.  Final Clinical Impression(s) / ED Diagnosis  Final diagnoses:  DOE (dyspnea on exertion)       Note:  This document was prepared using Dragon voice recognition software and may include unintentional dictation errors.   Lilia Pro., MD 08/14/19 2352

## 2019-08-14 NOTE — ED Notes (Signed)
Pt ambulated with pulse ox in place. O2 sat remained mostly in 92-94 range, but did dip to 85 toward the end. He was asked if he felt differently than when we started or sob, and he denied any increased sob.

## 2019-08-15 ENCOUNTER — Inpatient Hospital Stay: Payer: Medicare HMO

## 2019-08-15 ENCOUNTER — Encounter: Payer: Self-pay | Admitting: *Deleted

## 2019-08-15 ENCOUNTER — Other Ambulatory Visit: Payer: Self-pay

## 2019-08-15 DIAGNOSIS — I5022 Chronic systolic (congestive) heart failure: Secondary | ICD-10-CM

## 2019-08-15 DIAGNOSIS — I472 Ventricular tachycardia: Secondary | ICD-10-CM

## 2019-08-15 DIAGNOSIS — R55 Syncope and collapse: Secondary | ICD-10-CM

## 2019-08-15 LAB — BASIC METABOLIC PANEL
Anion gap: 10 (ref 5–15)
BUN: 62 mg/dL — ABNORMAL HIGH (ref 6–20)
CO2: 27 mmol/L (ref 22–32)
Calcium: 8.1 mg/dL — ABNORMAL LOW (ref 8.9–10.3)
Chloride: 103 mmol/L (ref 98–111)
Creatinine, Ser: 3.43 mg/dL — ABNORMAL HIGH (ref 0.61–1.24)
GFR calc Af Amer: 22 mL/min — ABNORMAL LOW (ref >60)
GFR calc non Af Amer: 19 mL/min — ABNORMAL LOW (ref >60)
Glucose, Bld: 240 mg/dL — ABNORMAL HIGH (ref 70–99)
Potassium: 3.8 mmol/L (ref 3.5–5.1)
Sodium: 140 mmol/L (ref 135–145)

## 2019-08-15 LAB — GLUCOSE, CAPILLARY
Glucose-Capillary: 281 mg/dL — ABNORMAL HIGH (ref 70–99)
Glucose-Capillary: 263 mg/dL — ABNORMAL HIGH (ref 70–99)
Glucose-Capillary: 202 mg/dL — ABNORMAL HIGH (ref 70–99)

## 2019-08-15 LAB — SARS CORONAVIRUS 2 (TAT 6-24 HRS): SARS Coronavirus 2: NEGATIVE

## 2019-08-15 MED ORDER — FUROSEMIDE 10 MG/ML IJ SOLN
80.0000 mg | Freq: Once | INTRAMUSCULAR | Status: AC
  Administered 2019-08-15: 80 mg via INTRAVENOUS
  Filled 2019-08-15: qty 8

## 2019-08-15 MED ORDER — SODIUM CHLORIDE 0.9% FLUSH
3.0000 mL | Freq: Two times a day (BID) | INTRAVENOUS | Status: DC
  Administered 2019-08-15 – 2019-08-18 (×7): 3 mL via INTRAVENOUS

## 2019-08-15 MED ORDER — OXYCODONE HCL 5 MG PO TABS: 10.0000 mg | ORAL_TABLET | Freq: Three times a day (TID) | ORAL | Status: DC | PRN

## 2019-08-15 MED ORDER — TORSEMIDE 20 MG PO TABS
20.0000 mg | ORAL_TABLET | Freq: Every day | ORAL | Status: DC
  Administered 2019-08-15 – 2019-08-17 (×3): 20 mg via ORAL
  Filled 2019-08-15 (×3): qty 1

## 2019-08-15 MED ORDER — TECHNETIUM TO 99M ALBUMIN AGGREGATED
4.1340 | Freq: Once | INTRAVENOUS | Status: AC | PRN
  Administered 2019-08-15: 4.134 via INTRAVENOUS

## 2019-08-15 MED ORDER — TECHNETIUM TC 99M DIETHYLENETRIAME-PENTAACETIC ACID
32.7430 | Freq: Once | INTRAVENOUS | Status: AC | PRN
  Administered 2019-08-15: 13:00:00 32.743 via RESPIRATORY_TRACT

## 2019-08-15 MED ORDER — INSULIN ASPART 100 UNIT/ML ~~LOC~~ SOLN
0.0000 [IU] | Freq: Three times a day (TID) | SUBCUTANEOUS | Status: DC
  Administered 2019-08-15 (×2): 5 [IU] via SUBCUTANEOUS
  Administered 2019-08-16: 2 [IU] via SUBCUTANEOUS
  Administered 2019-08-16 – 2019-08-17 (×3): 3 [IU] via SUBCUTANEOUS
  Administered 2019-08-17: 2 [IU] via SUBCUTANEOUS
  Administered 2019-08-17: 3 [IU] via SUBCUTANEOUS
  Administered 2019-08-18: 2 [IU] via SUBCUTANEOUS
  Administered 2019-08-18: 3 [IU] via SUBCUTANEOUS
  Filled 2019-08-15 (×10): qty 1

## 2019-08-15 NOTE — H&P (Addendum)
[facility] NAME: [name] [name]    MR#:  [id number]  DATE OF BIRTH:  1961-01-13  DATE OF ADMISSION:  08/14/2019  PRIMARY CARE PHYSICIAN: Tracie Harrier, MD   REQUESTING/REFERRING PHYSICIAN: Derrell Lolling, MD  CHIEF COMPLAINT:   Chief Complaint  Patient presents with  . Hyperglycemia  . Hypertension  . Near Syncope    HISTORY OF PRESENT ILLNESS:  58 y.o. male with pertinent past medical history of CAD s/p CABG, HFr EF due to ICM, PVD, uncontrolled diabetes mellitus, CKD, hypertension, hyperlipidemia, and anemia of chronic disease the ED with chief complaints of near syncopal episode, significant dyspnea on exertion and generalized weakness.  The patient's wife is currently at the bedside, patient has been unable to ambulate without becoming short of breath.  Should wife state he walked between rooms and became so short of breath he sat down and almost lost consciousness.  No reports of seizure or seizure-like activity.  Wife noted that his oxygen was on the low 80s on room air.  He does not normally wear oxygen at home.  No reports of palpitation, fevers or chills, nausea, vomiting, diarrhea, or recent sick contacts.  Patient was recently discharged following admission for acute on chronic systolic CHF.  On arrival to the ED, he was afebrile with blood pressure 224/114 mm Hg and pulse rate 81 beats/min. There were no focal neurological deficits; he was alert and oriented x4, and he did not demonstrate any memory deficits.  Initial labs revealed glucose of 258, BUN 64, creatinine 3.65, hemoglobin 7.8, BNP 2227, COVID-19 negative.  Urinalysis negative for UTI.  X-ray showed mild cardiomegaly no evidence of edema or consolidation.  Hospitalists were asked to admit for further management.  PAST MEDICAL HISTORY:   Past Medical History:  Diagnosis Date  . CAD (coronary artery disease)    a. 04/2014 CABG x 4 (LIMA->LAD, VG->Diag, VG->OM,  VG->PDA).  . Chronic systolic CHF (congestive heart failure) ([place])    a. 07/2014 Echo: EF 30-35%.  . CKD (chronic kidney disease), stage III ([place])    a. 12/2015 Creat 1.8.  . Diabetic foot ulcers ([place])    a. left foot 2nd digit ant 5 th digit 05/03/14; b. 08/2014 s/p amputation of toes on L foot.  . Hypercholesterolemia    a. 12/2015 TC 116, TG 154, HDL 24, LDL 61-->Atorvastatin 40.  Marland Kitchen Hypertension   . Hypertensive heart disease   . Ischemic cardiomyopathy    a. 07/2013 Echo: EF 20%; b. 07/2014 Echo: EF 30-35%, diff HK, Gr1 DD, mildly dil LA, nl PASP.  Marland Kitchen Myocardial infarction ([place])   . Neuropathy in diabetes ([place])   . Open wound    foot  . Orthostatic hypotension   . Peripheral vascular disease ([place])   . Type II diabetes mellitus ([place])    a. 12/2015 HbA1c = 13.9.    PAST SURGICAL HISTORY:   Past Surgical History:  Procedure Laterality Date  . ACHILLES TENDON SURGERY Right 01/22/2017   Procedure: ACHILLES LENGTHENING/KIDNER/TAL/Teno achilles lengthening;  Surgeon: Samara Deist, DPM;  Location: [facility];  Service: Podiatry;  Laterality: Right;  . CARDIAC CATHETERIZATION     03/2014  . CATARACT EXTRACTION    . CORONARY ARTERY BYPASS GRAFT N/A 05/07/2014   Procedure: CORONARY ARTERY BYPASS GRAFTING (CABG);  Surgeon: Gaye Pollack, MD;  Location: [facility] [facility];  Service: Open Heart Surgery;  Laterality: N/A;  Times 4 using left internal mammary artery and endoscopically harvested  right saphenous vein  . DIALYSIS/PERMA CATHETER INSERTION N/A 08/04/2019   Procedure: DIALYSIS/PERMA CATHETER INSERTION;  Surgeon: Katha Cabal, MD;  Location: [facility];  Service: Cardiovascular;  Laterality: N/A;  . DIALYSIS/PERMA CATHETER REMOVAL N/A 08/08/2019   Procedure: DIALYSIS/PERMA CATHETER REMOVAL;  Surgeon: Katha Cabal, MD;  Location: [facility];  Service: Cardiovascular;  Laterality: N/A;  . INTRAOPERATIVE TRANSESOPHAGEAL ECHOCARDIOGRAM N/A 05/07/2014   Procedure: INTRAOPERATIVE  TRANSESOPHAGEAL ECHOCARDIOGRAM;  Surgeon: Gaye Pollack, MD;  Location: [facility];  Service: Open Heart Surgery;  Laterality: N/A;  . left foot osteomyelitis and wound after surgery    . TOE AMPUTATION     Left 3 and 4 toes  . TONSILECTOMY/ADENOIDECTOMY WITH MYRINGOTOMY      SOCIAL HISTORY:   Social History   Tobacco Use  . Smoking status: Never Smoker  . Smokeless tobacco: Never Used  Substance Use Topics  . Alcohol use: No    FAMILY HISTORY:   Family History  Problem Relation Age of Onset  . Heart disease Father        CABG in his 26's  . Diabetes type II Other   . Kidney disease Other   . Hypertension Other   . Ovarian cancer Mother     DRUG ALLERGIES:   Allergies  Allergen Reactions  . 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 Doesn't believe he has an allergy to PCN as he has taken it previously, according to wife.  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 Doesn't believe he has an allergy to PCN as he has taken it previously, according to wife.    REVIEW OF SYSTEMS:   Review of Systems  Constitutional: Positive for malaise/fatigue. Negative for chills, fever and weight loss.  HENT: Negative for congestion, hearing loss and sore throat.   Eyes: Negative for blurred vision and double vision.  Respiratory: Positive for shortness of breath. Negative for cough and wheezing.   Cardiovascular: Positive for orthopnea and leg swelling. Negative for palpitations.  Gastrointestinal: Negative for abdominal pain, diarrhea, nausea and vomiting.  Genitourinary: Negative for dysuria and urgency.  Musculoskeletal: Negative for myalgias.  Skin: Negative for rash.  Neurological: Positive for weakness. Negative for dizziness, sensory change, speech change, focal weakness and headaches.  Psychiatric/Behavioral: Negative for depression.    MEDICATIONS AT HOME:   Prior to Admission medications   Medication Sig Start Date End Date Taking? Authorizing Provider  aspirin EC 81 MG tablet Take 81 mg by mouth daily.   Yes [provider]  atorvastatin (LIPITOR) 40 MG tablet Take 1 tablet (40 mg total) by mouth at bedtime. 11/23/18  Yes Gollan, Kathlene November, MD  carvedilol (COREG) 25 MG tablet Take 1 tablet (25 mg total) by mouth 2 (two) times daily with a meal. Patient taking differently: Take 25 mg by mouth at bedtime.  11/23/18  Yes Gollan, Kathlene November, MD  Cyanocobalamin (VITAMIN B 12 PO) Take 1 tablet by mouth at bedtime.    Yes [provider]  famotidine (PEPCID) 20 MG tablet TAKE 1 TABLET BY MOUTH EVERY DAY Patient taking differently: Take 20 mg by mouth at bedtime.  07/18/19  Yes Tahiliani, Margretta Sidle B, MD  ferrous sulfate 325 (65 FE) MG tablet Take 325 mg by mouth daily.   Yes [provider]  hydrALAZINE (APRESOLINE) 25 MG tablet Take 1 tablet (25 mg total) by mouth 3 (three)  times daily. 08/08/19  Yes Fritzi Mandes, MD  HYDROmorphone (DILAUDID) 2 MG tablet Take 1 mg by mouth every 12 (twelve) hours as needed for severe pain.   Yes [provider]  insulin aspart (NOVOLOG) 100 UNIT/ML injection Inject 0-9 Units into the skin 3 (three) times daily with meals. 08/08/19  Yes Fritzi Mandes, MD  insulin detemir (LEVEMIR) 100 UNIT/ML injection Inject 0.15 mLs (15 Units total) into the skin daily. 08/09/19  Yes Fritzi Mandes, MD  isosorbide mononitrate (IMDUR) 60 MG 24 hr tablet Take 0.5 tablets (30 mg total) by mouth daily. 07/06/19  Yes Dunn, Areta Haber, PA-C  multivitamin (RENA-VIT) TABS tablet Take 1 tablet by mouth at bedtime. 08/08/19  Yes Fritzi Mandes, MD  sodium bicarbonate 650 MG tablet Take 1,300 mg by mouth 2 (two) times daily.  03/01/19  Yes [provider]  torsemide (DEMADEX) 20 MG tablet Take 2 tablets (40 mg total) by mouth daily. 08/09/19  Yes Fritzi Mandes, MD  nitroGLYCERIN (NITROSTAT) 0.4 MG SL tablet  Place 1 tablet (0.4 mg total) under the tongue every 5 (five) minutes as needed for chest pain. 12/24/15   Theora Gianotti, NP  oxyCODONE 10 MG TABS Take 1 tablet (10 mg total) by mouth every 8 (eight) hours as needed for moderate pain or breakthrough pain. 08/08/19   Fritzi Mandes, MD      VITAL SIGNS:  Blood pressure (!) 122/57, pulse 77, temperature 99.2 F (37.3 C), temperature source Oral, resp. rate 18, height 6\' 1"  (1.854 m), weight 119.4 kg, SpO2 96 %.  PHYSICAL EXAMINATION:   Physical Exam  GENERAL:  58 y.o.-year-old patient lying in the bed with no acute distress.  EYES: Pupils equal, round, reactive to light and accommodation. No scleral icterus. Extraocular muscles intact.  HEENT: Head atraumatic, normocephalic. Oropharynx and nasopharynx clear.  NECK:  Supple, no jugular venous distention. No thyroid enlargement, no tenderness.  LUNGS: Normal breath sounds bilaterally, no wheezing, rales,rhonchi or crepitation. No use of accessory muscles of respiration.  CARDIOVASCULAR: S1, S2 normal. No murmurs, rubs, or gallops.  ABDOMEN: Soft, nontender, nondistended. Bowel sounds present. No organomegaly or mass.  EXTREMITIES: Bilateral pitting edema, cyanosis, or clubbing.  NEUROLOGIC: Cranial nerves II through XII are intact. Muscle strength 5/5 in all extremities. Sensation intact. Gait not checked.  PSYCHIATRIC: The patient is alert and oriented x 3.  SKIN: No obvious rash, lesion, or ulcer.   DATA REVIEWED:  LABORATORY PANEL:   CBC Recent Labs  Lab 08/14/19 1428  WBC 9.1  HGB 7.8*  HCT 25.0*  PLT 337   ------------------------------------------------------------------------------------------------------------------  Chemistries  Recent Labs  Lab 08/14/19 1428 08/15/19 0451  NA 138 140  K 4.1 3.8  CL 100 103  CO2 26 27  GLUCOSE 270* 240*  BUN 64* 62*  CREATININE 3.65* 3.43*  CALCIUM 8.4* 8.1*  MG 2.2  --     ------------------------------------------------------------------------------------------------------------------  Cardiac Enzymes No results for input(s): TROPONINI in the last 168 hours. ------------------------------------------------------------------------------------------------------------------  RADIOLOGY:  Dg Chest 2 View  Result Date: 08/14/2019 CLINICAL DATA:  Shortness of breath.  Renal failure EXAM: CHEST - 2 VIEW COMPARISON:  July 27, 2019 FINDINGS: There is no edema or consolidation. There is slight cardiomegaly with pulmonary vascularity within normal limits. No adenopathy. Patient is status post coronary artery bypass grafting. No bone lesions. IMPRESSION: Mild cardiomegaly. Status post coronary artery bypass grafting. No evident edema or consolidation. Electronically Signed   By: Lowella Grip III M.D.   On: 08/14/2019 17:10  EKG:  EKG: normal EKG, normal sinus rhythm, unchanged from previous tracings. Vent. rate 79 BPM PR interval 172 ms QRS duration 88 ms QT/QTc 420/481 ms P-R-T axes 78 11 153 IMPRESSION AND PLAN:   58 y.o. male  with pertinent past medical history of CAD s/p CABG, HFr EF due to ICM, PVD, uncontrolled diabetes mellitus, CKD, hypertension, hyperlipidemia, and anemia of chronic disease the ED with chief complaints of near syncopal episode, significant dyspnea on exertion and generalized weakness.  1. Acute on chronic respiratory failure with hypoxia -likely secondary to CHF? - Admit to telemetry unit - Supplemental oxygen wean as tolerated  2. Acute on chronic Systolic Congestive Heart Failure:  BNP  elevated at 2227   Last Echo 8/20 , EF 30-35% - Continue Imdur and Coreg - Patient refused IV Lasix pending nephrology evaluation - Low salt diet  - Check daily weight - Strict I&Os - Cardiology Consult  3. Acute renal failure on chronic kidney disease stage V  - BUN/creatinine slightly elevated above baseline - Hold nephrotoxins -  Continue sodium bicarb - Nephrology consult  4. Uncontrolled diabetes mellitus type 2 - Recent hemoglobin A1c on 05/23/2019 6.9 - Continue Levemir - SSI  5.HTN  - Continue hydralazine and Coreg  6. HLD  - Atorvastatin 40mg  PO qhs  7.  Anemia of chronic disease: Hemoglobin 7.8 improved from 7.0  All the records are reviewed and case discussed with ED provider. Management plans discussed with the patient, family and they are in agreement.  CODE STATUS: FULL  TOTAL TIME TAKING CARE OF THIS PATIENT: 50 minutes.    on 08/15/2019 at 6:22 AM  Rufina Falco, DNP, FNP-BC [facility] Nurse Practitioner Between 7am to 6pm - Pager [phone]  After 6pm go to [email or web address] - password [email or web address]  Office  [phone]  CC: Primary care physician; Tracie Harrier, MD

## 2019-08-15 NOTE — Consult Note (Signed)
Cardiology Consultation:   Patient ID: [name] [name] MRN: [id number]; DOB: January 07, 1961  Admit date: 08/14/2019 Date of Consult: 08/15/2019  Primary Care Provider: Tracie Harrier, MD Primary Cardiologist: Ida Rogue, MD  Primary Electrophysiologist:  Virl Axe, MD    Patient Profile:   [name] [name] is a 58 y.o. male with a hx of CAD s/p four-vessel CABG (04/2014), HFrEF (EF 30-35%, 06/2019) secondary to ICM, NSVT, paroxysmal SVT, PVD, poorly controlled insulin-dependent diabetes, diabetic foot ulcers and diabetic peripheral neuropathy requiring amputations of the left toes, osteomyelitis, CKD stage IV, hypertension, hyperlipidemia, asymptomatic orthostatic hypotension, medication compliance issues secondary to financial issues, and who is being seen today per request of Rufina Falco, NP for the evaluation of shortness of breath and recent episode of LOC while seated.  History of Present Illness:   [name] [name] is a 58 year old male with PMH as above and including 4v CABG and recommendation for ICD by EP and upcoming appointment with Dr. Caryl Comes to discuss this ICD.    ---------------------------------------- --07/2013: Echo  EF of 20%.  Stress testing Ruled moderate risk scan.    ---03/2014 LHC showed 40% stenosis in the mid left main, 70% stenosis in the mid LAD, 70% stenosis in D2, 80% proximal left circumflex stenosis, 80% mid left circumflex stenosis, 99% proximal RCA stenosis, R PLA 99% stenosis.  He underwent 4-vessel CABG as outlined in patient profile above.  Follow-up echo EF 40 to 45%.   --09/2018, [facility] admission: 85UD diuresis, complicated by CKD. Stress test ruled moderate risk scan.  Outpatient cardiac monitoring after discharge with brief NSVT runs.    --01/2019: EP recommended ICD, declined by patient.  06/2019 echo showed a persistent cardiomyopathy, EF 30 to 35%, diffuse LV hypokinesis.    --07/06/2019: Clinic weight down 24 lbs and stated he had not sued Lasix in  at least 5 months & reduced his hydralazine and Imdur.  Home SBP 160s with PCP restart of losartan and follow-up labs showing stable renal function. He noted dizziness & fatigue.   --07/10/2023: [place] admission for dehydration and diarrhea. After discharge, he felt ongoing SOB/DOE, fatigue, and presyncope symptoms. He also noted acute back pain with most of his time spent in bed.  --07/27/2019: [facility] admission for ongoing symptoms as directly above and acute back pain. Large, round, 7cm non-specific soft tissue hematoma found on on MRI. Temporarily dialysis performed during admission for AKI.  ----------------------------------------------  On 08/14/2019, he presented to [facility] after he reportedly sat down, felt SOB, and then experienced a brief episode of "blacking out." He stated that he did not feel he was volume overloaded, given his home weight was unchanged. He denied feeling any chest pain. No report of racing HR or palpitations.  Labs significant for potassium 4.1, creatinine 3.62, phosphorus 4.3, magnesium 2.2, BNP 2227.0, troponin XX 8, hemoglobin 7.8, WBC 9.1.  Urinalysis performed and negative for active UTI.  Chest x-ray without significant edema or consolidation.  He was not started on diuresis prior to cardiology consultation, due to patient concern for his renal function. At the time of cardiology consultation, patient did not appear significantly volume overloaded and continued to deny chest pain, racing HR, or palpitations. Telemetry significant for a 19 beat run of NSVT earlier in the morning at 5:21AM.   Heart Pathway Score:     Past Medical History:  Diagnosis Date  . CAD (coronary artery disease)    a. 04/2014 CABG x 4 (LIMA->LAD, VG->Diag, VG->OM, VG->PDA).  . Chronic systolic CHF (congestive heart  failure) ([place])    a. 07/2014 Echo: EF 30-35%.  . CKD (chronic kidney disease), stage III ([place])    a. 12/2015 Creat 1.8.  . Diabetic foot ulcers ([place])    a. left foot 2nd digit ant 5 th  digit 05/03/14; b. 08/2014 s/p amputation of toes on L foot.  . Hypercholesterolemia    a. 12/2015 TC 116, TG 154, HDL 24, LDL 61-->Atorvastatin 40.  Marland Kitchen Hypertension   . Hypertensive heart disease   . Ischemic cardiomyopathy    a. 07/2013 Echo: EF 20%; b. 07/2014 Echo: EF 30-35%, diff HK, Gr1 DD, mildly dil LA, nl PASP.  Marland Kitchen Myocardial infarction ([place])   . Neuropathy in diabetes ([place])   . Open wound    foot  . Orthostatic hypotension   . Peripheral vascular disease ([place])   . Type II diabetes mellitus ([place])    a. 12/2015 HbA1c = 13.9.    Past Surgical History:  Procedure Laterality Date  . ACHILLES TENDON SURGERY Right 01/22/2017   Procedure: ACHILLES LENGTHENING/KIDNER/TAL/Teno achilles lengthening;  Surgeon: Samara Deist, DPM;  Location: [facility] ORS;  Service: Podiatry;  Laterality: Right;  . CARDIAC CATHETERIZATION     03/2014  . CATARACT EXTRACTION    . CORONARY ARTERY BYPASS GRAFT N/A 05/07/2014   Procedure: CORONARY ARTERY BYPASS GRAFTING (CABG);  Surgeon: Gaye Pollack, MD;  Location: [facility];  Service: Open Heart Surgery;  Laterality: N/A;  Times 4 using left internal mammary artery and endoscopically harvested right saphenous vein  . DIALYSIS/PERMA CATHETER INSERTION N/A 08/04/2019   Procedure: DIALYSIS/PERMA CATHETER INSERTION;  Surgeon: Katha Cabal, MD;  Location: [place];  Service: Cardiovascular;  Laterality: N/A;  . DIALYSIS/PERMA CATHETER REMOVAL N/A 08/08/2019   Procedure: DIALYSIS/PERMA CATHETER REMOVAL;  Surgeon: Katha Cabal, MD;  Location: [place] [facility];  Service: Cardiovascular;  Laterality: N/A;  . INTRAOPERATIVE TRANSESOPHAGEAL ECHOCARDIOGRAM N/A 05/07/2014   Procedure: INTRAOPERATIVE TRANSESOPHAGEAL ECHOCARDIOGRAM;  Surgeon: Gaye Pollack, MD;  Location: [facility];  Service: Open Heart Surgery;  Laterality: N/A;  . left foot osteomyelitis and wound after surgery    . TOE AMPUTATION     Left 3 and 4 toes  . TONSILECTOMY/ADENOIDECTOMY WITH  MYRINGOTOMY       Home Medications:  Prior to Admission medications   Medication Sig Start Date End Date Taking? Authorizing Provider  aspirin EC 81 MG tablet Take 81 mg by mouth daily.   Yes [provider]  atorvastatin (LIPITOR) 40 MG tablet Take 1 tablet (40 mg total) by mouth at bedtime. 11/23/18  Yes Gollan, Kathlene November, MD  carvedilol (COREG) 25 MG tablet Take 1 tablet (25 mg total) by mouth 2 (two) times daily with a meal. Patient taking differently: Take 25 mg by mouth at bedtime.  11/23/18  Yes Gollan, Kathlene November, MD  Cyanocobalamin (VITAMIN B 12 PO) Take 1 tablet by mouth at bedtime.    Yes [provider]  famotidine (PEPCID) 20 MG tablet TAKE 1 TABLET BY MOUTH EVERY DAY Patient taking differently: Take 20 mg by mouth at bedtime.  07/18/19  Yes Tahiliani, Margretta Sidle B, MD  ferrous sulfate 325 (65 FE) MG tablet Take 325 mg by mouth daily.   Yes [provider]  hydrALAZINE (APRESOLINE) 25 MG tablet Take 1 tablet (25 mg total) by mouth 3 (three) times daily. 08/08/19  Yes Fritzi Mandes, MD  HYDROmorphone (DILAUDID) 2 MG tablet Take 1 mg by mouth every 12 (twelve) hours as needed for severe pain.  Yes [provider]  insulin aspart (NOVOLOG) 100 UNIT/ML injection Inject 0-9 Units into the skin 3 (three) times daily with meals. 08/08/19  Yes Fritzi Mandes, MD  insulin detemir (LEVEMIR) 100 UNIT/ML injection Inject 0.15 mLs (15 Units total) into the skin daily. 08/09/19  Yes Fritzi Mandes, MD  isosorbide mononitrate (IMDUR) 60 MG 24 hr tablet Take 0.5 tablets (30 mg total) by mouth daily. 07/06/19  Yes Dunn, Areta Haber, PA-C  multivitamin (RENA-VIT) TABS tablet Take 1 tablet by mouth at bedtime. 08/08/19  Yes Fritzi Mandes, MD  sodium bicarbonate 650 MG tablet Take 1,300 mg by mouth 2 (two) times daily.  03/01/19  Yes [provider]  torsemide (DEMADEX) 20 MG tablet Take 2 tablets (40 mg total) by mouth daily. 08/09/19  Yes Fritzi Mandes, MD  nitroGLYCERIN (NITROSTAT) 0.4  MG SL tablet Place 1 tablet (0.4 mg total) under the tongue every 5 (five) minutes as needed for chest pain. 12/24/15   Theora Gianotti, NP  oxyCODONE 10 MG TABS Take 1 tablet (10 mg total) by mouth every 8 (eight) hours as needed for moderate pain or breakthrough pain. 08/08/19   Fritzi Mandes, MD    Inpatient Medications: Scheduled Meds: . aspirin EC  81 mg Oral Daily  . atorvastatin  40 mg Oral QHS  . carvedilol  25 mg Oral QHS  . famotidine  20 mg Oral QHS  . ferrous sulfate  325 mg Oral Daily  . heparin  5,000 Units Subcutaneous Q8H  . hydrALAZINE  25 mg Oral TID  . insulin detemir  15 Units Subcutaneous Daily  . isosorbide mononitrate  30 mg Oral Daily  . multivitamin  1 tablet Oral QHS  . sodium bicarbonate  1,300 mg Oral BID  . torsemide  20 mg Oral Daily   Continuous Infusions: . sodium chloride     PRN Meds: sodium chloride, acetaminophen, HYDROmorphone, nitroGLYCERIN, ondansetron (ZOFRAN) IV, oxyCODONE, sodium chloride flush  Allergies:    Allergies  Allergen Reactions  . 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 Doesn't believe he has an allergy to PCN as he has taken it previously, according to wife.  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 Doesn't believe he has an allergy to PCN as he has taken it previously, according to wife.    Social History:   Social History   Socioeconomic History  . Marital status: Married    Spouse name: Not on file  . Number of children: Not on file  . Years of education: Not on file  . Highest education level: Not on file  Occupational History  . Not on file  Social Needs  . Financial resource strain: Not on file  . Food insecurity    Worry: Not on file    Inability: Not on file  . Transportation needs    Medical: Not on file    Non-medical: Not on file  Tobacco Use  . Smoking  status: Never Smoker  . Smokeless tobacco: Never Used  Substance and Sexual Activity  . Alcohol use: No  . Drug use: No  . Sexual activity: Not on file  Lifestyle  . Physical activity    Days per week: Not on file    Minutes per session: Not on file  . Stress: Not on file  Relationships  . Social connections    Talks on phone: Not on file  Gets together: Not on file    Attends religious service: Not on file    Active member of club or organization: Not on file    Attends meetings of clubs or organizations: Not on file    Relationship status: Not on file  . Intimate partner violence    Fear of current or ex partner: Not on file    Emotionally abused: Not on file    Physically abused: Not on file    Forced sexual activity: Not on file  Other Topics Concern  . Not on file  Social History Narrative  . Not on file    Family History:    Family History  Problem Relation Age of Onset  . Heart disease Father        CABG in his 75's  . Diabetes type II Other   . Kidney disease Other   . Hypertension Other   . Ovarian cancer Mother      ROS:  Please see the history of present illness.  Review of Systems  Constitutional: Positive for malaise/fatigue.  Respiratory: Positive for shortness of breath. Negative for hemoptysis.   Cardiovascular: Positive for leg swelling. Negative for chest pain and palpitations.       Slight LEE  Gastrointestinal: Negative for blood in stool and melena.  Genitourinary: Negative for hematuria.  Musculoskeletal: Negative for falls.       No falls. Was seated during the "black out" episode  Neurological: Positive for dizziness, loss of consciousness and weakness.  All other systems reviewed and are negative.   All other ROS reviewed and negative.     Physical Exam/Data:   Vitals:   08/15/19 0018 08/15/19 0046 08/15/19 0322 08/15/19 0801  BP:   (!) 122/57 (!) 165/81  Pulse:   77 76  Resp:   18 16  Temp: (!) 97.5 F (36.4 C)  99.2 F  (37.3 C) 98.3 F (36.8 C)  TempSrc: Oral  Oral   SpO2:  97% 96% 98%  Weight:   119.4 kg   Height:   6\' 1"  (1.854 m)     Intake/Output Summary (Last 24 hours) at 08/15/2019 0942 Last data filed at 08/15/2019 0643 Gross per 24 hour  Intake -  Output 600 ml  Net -600 ml   Last 3 Weights 08/15/2019 08/15/2019 08/14/2019  Weight (lbs) 263 lb 4.8 oz 263 lb 4.8 oz 263 lb 4.8 oz  Weight (kg) 119.432 kg 119.432 kg 119.432 kg     Body mass index is 34.74 kg/m.  General:  Obese male in NAD HEENT: normal Neck: JVP ~9cm Vascular: No carotid bruits; radial pulses 2+ bilaterally Cardiac:  normal S1, S2; RRR; no murmur  Lungs:  clear to auscultation bilaterally, no wheezing, rhonchi or rales.   Abd: soft, nontender, no hepatomegaly  Ext: mild non-pitting bilateral lower extremity edema Musculoskeletal:  No deformities, BUE and BLE strength normal and equal Skin: warm and dry  Neuro:  No focal abnormalities noted Psych:  Normal affect   EKG:  The EKG was personally reviewed and demonstrates:  Normal sinus rhythm, 79 bpm, borderline IVCD with ST/T repolarization changes noted, QRS 88 ms, poor conduction inferior leads Telemetry:  Telemetry was personally reviewed and demonstrates:  NSR 70-80s with exception of 19 beat NSVT at 5:21AM  Relevant CV Studies: 2D Echo 06/30/2019: 1. The left ventricle has moderate-severely reduced systolic function, with an ejection fraction of 30-35%. The cavity size was normal. There is mildly increased left ventricular wall thickness. Left  ventricular diastolic Doppler parameters are  consistent with pseudonormalization. Left ventricular diffuse hypokinesis. 2. The right ventricle has normal systolic function. The cavity was normal. There is no increase in right ventricular wall thickness. Unable to estimate RVSP. 3. Left atrial size was moderately dilated. __________  Elwyn Reach 10/2018: Normal sinus rhythm avg HR of 81 bpm.  8 Ventricular Tachycardia runs  occurred, the run with the fastest interval lasting 4 beats with a max rate of 200 bpm,  the longest lasting 17 beats with an avg rate of 120 bpm.   7 Supraventricular Tachycardia runs occurred,  the run with the fastest interval lasting 5 beats with a max rate of 148 bpm,  the longest lasting 13 beats with an avg rate of 110 bpm.   Isolated SVEs were rare (<1.0%), SVE Couplets were rare (<1.0%), and SVE Triplets were rare (<1.0%).  Isolated VEs were rare (<1.0%), VE Couplets were rare (<1.0%), and no VE Triplets were present. Ventricular Trigeminy was present. __________  Myoview 09/2018: Pharmacological myocardial perfusion imaging study with large region of fixed inferior and inferolateral wall perfusion defect consistent with previous MI Very mild ischemia in the inferolateral wall (peri-infarct ischemia) Hypokinesis noted in the inferior wall. EF estimated at 36% No EKG changes concerning for ischemia at peak stress or in recovery. Moderate risk scan Similar findings on the study noted in 02/2018 and several years ago.  Laboratory Data:  High Sensitivity Troponin:   Recent Labs  Lab 07/27/19 1711 07/27/19 1909 07/28/19 0303 07/28/19 0547 08/14/19 1428  TROPONINIHS 32* 55* 35* 31* 28*     Cardiac EnzymesNo results for input(s): TROPONINI in the last 168 hours. No results for input(s): TROPIPOC in the last 168 hours.  Chemistry Recent Labs  Lab 08/14/19 1428 08/15/19 0451  NA 138 140  K 4.1 3.8  CL 100 103  CO2 26 27  GLUCOSE 270* 240*  BUN 64* 62*  CREATININE 3.65* 3.43*  CALCIUM 8.4* 8.1*  GFRNONAA 18* 19*  GFRAA 20* 22*  ANIONGAP 12 10    No results for input(s): PROT, ALBUMIN, AST, ALT, ALKPHOS, BILITOT in the last 168 hours. Hematology Recent Labs  Lab 08/14/19 1428  WBC 9.1  RBC 2.65*  HGB 7.8*  HCT 25.0*  MCV 94.3  MCH 29.4  MCHC 31.2  RDW 15.3  PLT 337   BNP Recent Labs  Lab 08/14/19 1428  BNP 2,227.0*    DDimer No results for  input(s): DDIMER in the last 168 hours.   Radiology/Studies:  Dg Chest 2 View  Result Date: 08/14/2019 CLINICAL DATA:  Shortness of breath.  Renal failure EXAM: CHEST - 2 VIEW COMPARISON:  July 27, 2019 FINDINGS: There is no edema or consolidation. There is slight cardiomegaly with pulmonary vascularity within normal limits. No adenopathy. Patient is status post coronary artery bypass grafting. No bone lesions. IMPRESSION: Mild cardiomegaly. Status post coronary artery bypass grafting. No evident edema or consolidation. Electronically Signed   By: Lowella Grip III M.D.   On: 08/14/2019 17:10    Assessment and Plan:   Brief LOC / Syncope with Hypoxia --No further symptoms since admission. Admitted with brief episode of LOC while seated. No preceding CP, racing HR, palpitations, or pre-syncope symptoms. Did note SOB. --Consider transient arrhythmia with greatest concern for VT. (see above HPI). 19 beat NSVT on telemetry as above with known history of NSVT. Case discussed with Dr. Caryl Comes / EP this admission with recommendation for LifeVest at discharge until outpatient ICD formal evaluation. --Check  orthostatics given known history of orthostatic hypotension. --Consider pulmonary embolism given increased sedentary lifestyle and prolonged recent hospitalizations. Recommend VQ scan, as given renal function with recent temporary dialysis, prefer to avoid contrast exposure if possible.  --Most recent echo as above with EF 30-35% and no significant valvular disease that might contribute to symptoms. Patient not significantly volume overloaded on exam.  --Daily BMET to avoid electrolyte abnormalities and known CKD as below. --Daily CBC given recent hematoma. --Further recommendations following the above workup.  Chronic HFrEF (EF 30-35%) --Not significantly volume overloaded on exam. No recent increase in home weight. --No indication for IV diuresis. Avoid aggressive diuresis given known CKD  with AKI and temporary dialysis at most recent admission in August 2020.  --Daily BMET as above. --Most recent echo above, EF 30-35% (06/30/2019). --Continue to monitor I/O. -600cc yesterday. -- Discharge wt 126.4kg (9/22)  and most recent weight this admission down 119.4kg (9/29). --Continue PTA torsemide, Coreg, hydralazine, and Imdur. Renal function precludes the escalation of guideline directed therapy including ACE/ARB/aldosterone antagonist.  History of recent soft tissue hematoma --No back pain. Previous admission with soft tissue hematoma as above in HPI.  Daily CBC with baseline / known anemia as below.  History of NSVT --Recommendations as above.  Continue to monitor on telemetry.  Daily BMET to monitor electrolytes.  Recent TSH 3.647.  Discharge with LifeVest and follow-up with EP for ICD candidacy.  CAD s/p 4v CABG --No current or recent CP.  Extensive cardiac history as above in HPI.  EKG as above and without acute changes.  High-sensitivity troponin 28 and not consistent with ACS.  No plan for further ischemic work-up with cardiac catheterization at this time.  Continue PTA medical management.  HLD --Continue statin therapy.  HTN, hypertensive heart disease History of orthostatic hypotension --History of labile BP. --Continue medical management as above.  --Check orthostatics given brief syncope / LOC leading to admission.   CKDIV --Followed closely by nephrology with suspicion for cardiorenal syndrome and worsening renal function in the setting of poorly controlled diabetes.  Recent admission with temporary dialysis performed for increasing Cr.  --Daily BMET.  Cr 3.43 with BUN 62. K  3.8. --Avoid contrast procedures /nephrotoxins as above.  Renally dose medications. --Nephrology following.  Anemia of chronic disease, iron deficiency anemia --Daily CBC as above.  Continue PTA iron supplement.  DM2, poorly controlled, with complications  --History of PVD and amputations  2/2 poorly controlled DM2, as well as likely contributing to worsening renal function. --SSI, per IM.    For questions or updates, please contact [place] Please consult [email or web address] for contact info under     Signed, Arvil Chaco, PA-C  08/15/2019 9:42 AM

## 2019-08-15 NOTE — Progress Notes (Signed)
SATURATION QUALIFICATIONS: (This note is used to comply with regulatory documentation for home oxygen)  Patient Saturations on Room Air at Rest = 97%  Patient Saturations on Room Air while Ambulating = 80%   Patient Saturations on 2 Liters of oxygen while Ambulating = 92%  Please briefly explain why patient needs home oxygen:

## 2019-08-15 NOTE — Plan of Care (Signed)
  Problem: Activity: Goal: Risk for activity intolerance will decrease Outcome: Progressing   Problem: Nutrition: Goal: Adequate nutrition will be maintained Outcome: Progressing   Problem: Coping: Goal: Level of anxiety will decrease Outcome: Progressing   Problem: Elimination: Goal: Will not experience complications related to urinary retention Outcome: Progressing   Problem: Pain Managment: Goal: General experience of comfort will improve Outcome: Progressing   Problem: Safety: Goal: Ability to remain free from injury will improve Outcome: Progressing   

## 2019-08-15 NOTE — Progress Notes (Signed)
[name] [name] at [facility] NAME: [name] [name]    MR#:  [id number]  DATE OF BIRTH:  1961/02/22  SUBJECTIVE:   patient with SOB does not feel like he is that fluid overloaded Reports in the past he is had a lot more fluid than he feels like he has now. REVIEW OF SYSTEMS:    Review of Systems  Constitutional: Negative for fever, chills weight loss HENT: Negative for ear pain, nosebleeds, congestion, facial swelling, rhinorrhea, neck pain, neck stiffness and ear discharge.   Respiratory: Negative for cough, ++shortness of breath, no wheezing  Cardiovascular: Negative for chest pain, palpitations and leg swelling.  Gastrointestinal: Negative for heartburn, abdominal pain, vomiting, diarrhea or consitpation Genitourinary: Negative for dysuri a, urgency, frequency, hematuria Musculoskeletal: Negative for back pain or joint pain Neurological: Negative for dizziness, seizures, syncope, focal weakness,  numbness and headaches.  Hematological: Does not bruise/bleed easily.  Psychiatric/Behavioral: Negative for hallucinations, confusion, dysphoric mood    Tolerating Diet: yes      DRUG ALLERGIES:   Allergies  Allergen Reactions  . 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 Doesn't believe he has an allergy to PCN as he has taken it previously, according to wife.  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 Doesn't believe he has an allergy to PCN as he has taken it previously, according to wife.    VITALS:  Blood pressure (!) 165/81, pulse 76, temperature 98.3 F (36.8 C), resp. rate 16, height 6\' 1"  (1.854 m), weight 119.4 kg, SpO2 98 %.  PHYSICAL EXAMINATION:  Constitutional: Appears well-developed and well-nourished. No distress. HENT: Normocephalic. Marland Kitchen Oropharynx is clear and moist.  Eyes: Conjunctivae and  EOM are normal. PERRLA, no scleral icterus.  Neck: Normal ROM. Neck supple. No JVD. No tracheal deviation. CVS: RRR, S1/S2 +, no murmurs, no gallops, no carotid bruit.  Pulmonary: Effort and breath sounds normal, no stridor, rhonchi, wheezes, rales.  Abdominal: Soft. BS +,  no distension, tenderness, rebound or guarding.  Musculoskeletal: Normal range of motion. 1+ LEE and no tenderness.  Neuro: Alert. CN 2-12 grossly intact. No focal deficits. Skin: Skin is warm and dry. No rash noted. Psychiatric: Normal mood and affect.      LABORATORY PANEL:   CBC Recent Labs  Lab 08/14/19 1428  WBC 9.1  HGB 7.8*  HCT 25.0*  PLT 337   ------------------------------------------------------------------------------------------------------------------  Chemistries  Recent Labs  Lab 08/14/19 1428 08/15/19 0451  NA 138 140  K 4.1 3.8  CL 100 103  CO2 26 27  GLUCOSE 270* 240*  BUN 64* 62*  CREATININE 3.65* 3.43*  CALCIUM 8.4* 8.1*  MG 2.2  --    ------------------------------------------------------------------------------------------------------------------  Cardiac Enzymes No results for input(s): TROPONINI in the last 168 hours. ------------------------------------------------------------------------------------------------------------------  RADIOLOGY:  Dg Chest 2 View  Result Date: 08/14/2019 CLINICAL DATA:  Shortness of breath.  Renal failure EXAM: CHEST - 2 VIEW COMPARISON:  July 27, 2019 FINDINGS: There is no edema or consolidation. There is slight cardiomegaly with pulmonary vascularity within normal limits. No adenopathy. Patient is status post coronary artery bypass grafting. No bone lesions. IMPRESSION: Mild cardiomegaly. Status post coronary artery bypass grafting. No evident edema or consolidation. Electronically Signed   By: Lowella Grip III M.D.   On: 08/14/2019 17:10     ASSESSMENT AND PLAN:   58 year old male with CAD status post four-vessel CABG,  chronic systolic heart failure ejection fraction 30 to 35%, PSVT and poorly controlled diabetes who presents to the emergency room due to shortness of breath and syncope.  1.  Syncope with hypoxia: Concern may be for transient arrhythmia with greatest concern for V. tach.  Patient had asymptomatic 19 beat nonsustained V. tach on telemetry with history ofNSVY/  Plan to do VQ scan to rule out pulmonary emboli. Most recent echocardiogram showed ejection fraction 30 to 35% with no significant valvular disease. Check orthostatic vital signs. Appreciate Cardiology consult  2.  Acute on chronic systolic heart failure ejection fraction 30 to 35%: Patient has agreed on torsemide 20 mg daily. Patient currently euvolemic as per cardiology and nephrology. Renal function precludes using ACEI/ARB/Entressto   3.  Chronic kidney disease with baseline creatinine between 3-3.2 stage IV: Avoid nephrotoxic medications Appreciate nephrology consultation Will need to have outpatient follow-up with Dr. Candiss Norse at discharge. Continue sodium bicarb 4.  Status post four-vessel CABG: Patient has ruled out for ACS during this admission. Continue Coreg, hydralazine, Imdur, statin aspirin   5.  Essential hypertension: Continue hydralazine, Coreg, Imdur  6.  Diabetes: Continue Levemir with sliding scale  7.  Anemia of chronic disease: Transfuse if hemoglobin less than 7. Continue ferrous sulfate Discussed with cardiology and nephrology   Management plans discussed with the patient and he is in agreement.  CODE STATUS: Full  TOTAL TIME TAKING CARE OF THIS PATIENT: 30 minutes.     POSSIBLE D/C 1 to 2 days, DEPENDING ON CLINICAL CONDITION.   Bettey Costa M.D on 08/15/2019 at 11:55 AM  Between 7am to 6pm - Pager - [phone] After 6pm go to [email or web address] - password [email or web address] [facility]  Office  [phone]  CC: Primary care physician; Tracie Harrier, MD  Note: This dictation  was prepared with Dragon dictation along with smaller phrase technology. Any transcriptional errors that result from this process are unintentional.

## 2019-08-15 NOTE — Progress Notes (Signed)
Inpatient Diabetes Program Recommendations  AACE/ADA: New Consensus Statement on Inpatient Glycemic Control (2015)  Target Ranges:  Prepandial:   less than 140 mg/dL      Peak postprandial:   less than 180 mg/dL (1-2 hours)      Critically ill patients:  140 - 180 mg/dL   Results for CHANC, KERVIN (MRN [id number]) as of 08/15/2019 09:51  Ref. Range 08/14/2019 14:21  Glucose-Capillary Latest Ref Range: 70 - 99 mg/dL 258 (H)   Results for RODERT, HINCH (MRN [id number]) as of 08/15/2019 09:51  Ref. Range 07/28/2019 03:03  Hemoglobin A1C Latest Ref Range: 4.8 - 5.6 % 7.2 (H)    Admit with: Acute on chronic respiratory failure with hypoxia -likely secondary to CHF  History: DM, CKD, CABG, CHF  Home DM Meds: Levemir 15 units Daily       Novolog 0-9 units TID per SSI  Current Orders: Levemir 15 units Daily      MD- Patient currently only has orders for Levemir insulin.  Please add Novolog Sensitive Correction Scale/ SSI (0-9 units) TID AC + HS      --Will follow patient during hospitalization--  Wyn Quaker RN, MSN, CDE Diabetes Coordinator Inpatient Glycemic Control Team Team Pager: [phone] (8a-5p)

## 2019-08-15 NOTE — Progress Notes (Signed)
[facility]  ROUNDING NOTE   Subjective:   [name] admitted to [facility] on 08/14/2019 for DOE (dyspnea on exertion) [R06.09]  Patient refused furosemide overnight. Requiring oxygen.   Wife at bedside.   Discharged from [facility] from 9/10 -9/22 for acute exacerbation of systolic congestive heart failure complicated by cardiorenal syndrome requiring temporary hemodialysis. Dialysis catheter has been removed.   Objective:  Vital signs in last 24 hours:  Temp:  [97.5 F (36.4 C)-99.2 F (37.3 C)] 98.3 F (36.8 C) (09/29 0801) Pulse Rate:  [76-79] 76 (09/29 0801) Resp:  [12-26] 16 (09/29 0801) BP: (121-185)/(57-101) 165/81 (09/29 0801) SpO2:  [92 %-100 %] 98 % (09/29 0801) Weight:  [119.4 kg-120.2 kg] 119.4 kg (09/29 0322)  Weight change:  Filed Weights   08/14/19 2313 08/15/19 0017 08/15/19 0322  Weight: 119.4 kg 119.4 kg 119.4 kg    Intake/Output: I/O last 3 completed shifts: In: -  Out: 600 [Urine:600]   Intake/Output this shift:  Total I/O In: 240 [P.O.:240] Out: -   Physical Exam: General: NAD,   Head: Normocephalic, atraumatic. Moist oral mucosal membranes  Eyes: Anicteric, PERRL  Neck: Supple, trachea midline  Lungs:  Clear to auscultation  Heart: Regular rate and rhythm  Abdomen:  Soft, nontender,   Extremities:  no peripheral edema.  Neurologic: Nonfocal, moving all four extremities  Skin: No lesions  Access: none    Basic Metabolic Panel: Recent Labs  Lab 08/14/19 1428 08/15/19 0451  NA 138 140  K 4.1 3.8  CL 100 103  CO2 26 27  GLUCOSE 270* 240*  BUN 64* 62*  CREATININE 3.65* 3.43*  CALCIUM 8.4* 8.1*  MG 2.2  --   PHOS 4.3  --     Liver Function Tests: No results for input(s): AST, ALT, ALKPHOS, BILITOT, PROT, ALBUMIN in the last 168 hours. No results for input(s): LIPASE, AMYLASE in the last 168 hours. No results for input(s): AMMONIA in the last 168 hours.  CBC: Recent Labs  Lab 08/14/19 1428  WBC 9.1  HGB 7.8*   HCT 25.0*  MCV 94.3  PLT 337    Cardiac Enzymes: No results for input(s): CKTOTAL, CKMB, CKMBINDEX, TROPONINI in the last 168 hours.  BNP: Invalid input(s): POCBNP  CBG: Recent Labs  Lab 08/08/19 1158 08/14/19 1421  GLUCAP 263* 258*    Microbiology: Results for orders placed or performed during the hospital encounter of 08/14/19  SARS CORONAVIRUS 2 (TAT 6-24 HRS) Nasopharyngeal Nasopharyngeal Swab     Status: None   Collection Time: 08/14/19  9:05 PM   Specimen: Nasopharyngeal Swab  Result Value Ref Range Status   SARS Coronavirus 2 NEGATIVE NEGATIVE Final    Comment: (NOTE) SARS-CoV-2 target nucleic acids are NOT DETECTED. The SARS-CoV-2 RNA is generally detectable in upper and lower respiratory specimens during the acute phase of infection. Negative results do not preclude SARS-CoV-2 infection, do not rule out co-infections with other pathogens, and should not be used as the sole basis for treatment or other patient management decisions. Negative results must be combined with clinical observations, patient history, and epidemiological information. The expected result is Negative. Fact Sheet for Patients: SugarRoll.be Fact Sheet for Healthcare Providers: https://www.woods-mathews.com/ This test is not yet approved or cleared by the [place] FDA and  has been authorized for detection and/or diagnosis of SARS-CoV-2 by FDA under an Emergency Use Authorization (EUA). This EUA will remain  in effect (meaning this test can be used) for the duration of the  COVID-19 declaration under Section 56 4(b)(1) of the Act, 21 U.S.C. section 360bbb-3(b)(1), unless the authorization is terminated or revoked sooner. Performed at [facility], [place] [address] [place], [place] [address]     Coagulation Studies: No results for input(s): LABPROT, INR in the last 72 hours.  Urinalysis: Recent Labs    08/14/19 1724  COLORURINE  YELLOW*  LABSPEC 1.011  PHURINE 7.0  GLUCOSEU >=500*  HGBUR NEGATIVE  BILIRUBINUR NEGATIVE  KETONESUR NEGATIVE  PROTEINUR 100*  NITRITE NEGATIVE  LEUKOCYTESUR NEGATIVE      Imaging: Dg Chest 2 View  Result Date: 08/14/2019 CLINICAL DATA:  Shortness of breath.  Renal failure EXAM: CHEST - 2 VIEW COMPARISON:  July 27, 2019 FINDINGS: There is no edema or consolidation. There is slight cardiomegaly with pulmonary vascularity within normal limits. No adenopathy. Patient is status post coronary artery bypass grafting. No bone lesions. IMPRESSION: Mild cardiomegaly. Status post coronary artery bypass grafting. No evident edema or consolidation. Electronically Signed   By: Lowella Grip III M.D.   On: 08/14/2019 17:10     Medications:   . sodium chloride     . aspirin EC  81 mg Oral Daily  . atorvastatin  40 mg Oral QHS  . carvedilol  25 mg Oral QHS  . famotidine  20 mg Oral QHS  . ferrous sulfate  325 mg Oral Daily  . heparin  5,000 Units Subcutaneous Q8H  . hydrALAZINE  25 mg Oral TID  . insulin detemir  15 Units Subcutaneous Daily  . isosorbide mononitrate  30 mg Oral Daily  . multivitamin  1 tablet Oral QHS  . sodium bicarbonate  1,300 mg Oral BID  . torsemide  20 mg Oral Daily   sodium chloride, acetaminophen, HYDROmorphone, nitroGLYCERIN, ondansetron (ZOFRAN) IV, oxyCODONE, sodium chloride flush  Assessment/ Plan:  [name] is a 58 y.o. white male  withhypertension, diabetes mellitus type II, diabetic neuropathy, systolic congestive heart failure, peripheral vascular disease status post left toe amputations, autonomic neuropathy, coronary artery disease status post CABG, history of osteomyelitis, who is admitted for DOE (dyspnea on exertion) [R06.09]   1. Acute kidney failure on chronic kidney disease stage IV: baseline creatinine of 2.42, GFR of 29 on 02/23/19.  Required hemodialysis on last admission.  - sodium bicarbonate.   2. Acute exacerbation of  chronic systolic congestive heart failure.  - PO torsemide - if V/Q scan negative: will give IV furosemide bolus.   3. Hypertension: 165/81 - carvedilol, torsemide, isosorbide mononitrate, and hydralazine.   4. Anemia of chronic kidney disease: normocytic. Hemoglobin 7.8    LOS: 1 Gizell Danser 9/29/202010:30 AM

## 2019-08-16 DIAGNOSIS — I428 Other cardiomyopathies: Secondary | ICD-10-CM

## 2019-08-16 LAB — CBC
HCT: 25.1 % — ABNORMAL LOW (ref 39.0–52.0)
Hemoglobin: 7.7 g/dL — ABNORMAL LOW (ref 13.0–17.0)
MCH: 29.1 pg (ref 26.0–34.0)
MCHC: 30.7 g/dL (ref 30.0–36.0)
MCV: 94.7 fL (ref 80.0–100.0)
Platelets: 288 10*3/uL (ref 150–400)
RBC: 2.65 MIL/uL — ABNORMAL LOW (ref 4.22–5.81)
RDW: 15.7 % — ABNORMAL HIGH (ref 11.5–15.5)
WBC: 8.4 10*3/uL (ref 4.0–10.5)
nRBC: 0 % (ref 0.0–0.2)

## 2019-08-16 LAB — GLUCOSE, CAPILLARY
Glucose-Capillary: 194 mg/dL — ABNORMAL HIGH (ref 70–99)
Glucose-Capillary: 243 mg/dL — ABNORMAL HIGH (ref 70–99)
Glucose-Capillary: 213 mg/dL — ABNORMAL HIGH (ref 70–99)
Glucose-Capillary: 154 mg/dL — ABNORMAL HIGH (ref 70–99)

## 2019-08-16 LAB — BASIC METABOLIC PANEL
Anion gap: 13 (ref 5–15)
BUN: 59 mg/dL — ABNORMAL HIGH (ref 6–20)
CO2: 27 mmol/L (ref 22–32)
Calcium: 8.5 mg/dL — ABNORMAL LOW (ref 8.9–10.3)
Chloride: 102 mmol/L (ref 98–111)
Creatinine, Ser: 3.45 mg/dL — ABNORMAL HIGH (ref 0.61–1.24)
GFR calc Af Amer: 22 mL/min — ABNORMAL LOW (ref >60)
GFR calc non Af Amer: 19 mL/min — ABNORMAL LOW (ref >60)
Glucose, Bld: 211 mg/dL — ABNORMAL HIGH (ref 70–99)
Potassium: 3.9 mmol/L (ref 3.5–5.1)
Sodium: 142 mmol/L (ref 135–145)

## 2019-08-16 MED ORDER — CARVEDILOL 25 MG PO TABS
25.0000 mg | ORAL_TABLET | Freq: Two times a day (BID) | ORAL | Status: DC
  Administered 2019-08-16 – 2019-08-18 (×4): 25 mg via ORAL
  Filled 2019-08-16 (×4): qty 1

## 2019-08-16 MED ORDER — FUROSEMIDE 10 MG/ML IJ SOLN
40.0000 mg | Freq: Once | INTRAMUSCULAR | Status: AC
  Administered 2019-08-16: 40 mg via INTRAVENOUS
  Filled 2019-08-16: qty 4

## 2019-08-16 NOTE — Progress Notes (Signed)
SATURATION QUALIFICATIONS: (This note is used to comply with regulatory documentation for home oxygen)  Patient Saturations on Room Air at Rest = 94%  Patient Saturations on Room Air while Ambulating = 87%  Patient Saturations on 2 Liters of oxygen while Ambulating = 95%  Please briefly explain why patient needs home oxygen: 

## 2019-08-16 NOTE — Progress Notes (Signed)
Progress Note  Patient Name: [name] [name] Date of Encounter: 08/16/2019  Primary Cardiologist: Ida Rogue, MD   Subjective   Patient seen with wife at bedside, doing okay.  Patient would like to be evaluated for possible ICD placement.  He has an appointment with EP as outpatient for tomorrow.  Overnight telemetry showed sinus rhythm with frequent PVCs.  2 days ago patient had nonsustained VT.  Inpatient Medications    Scheduled Meds: . aspirin EC  81 mg Oral Daily  . atorvastatin  40 mg Oral QHS  . carvedilol  25 mg Oral BID WC  . famotidine  20 mg Oral QHS  . ferrous sulfate  325 mg Oral Daily  . heparin  5,000 Units Subcutaneous Q8H  . hydrALAZINE  25 mg Oral TID  . insulin aspart  0-9 Units Subcutaneous TID WC  . insulin detemir  15 Units Subcutaneous Daily  . isosorbide mononitrate  30 mg Oral Daily  . multivitamin  1 tablet Oral QHS  . sodium bicarbonate  1,300 mg Oral BID  . sodium chloride flush  3 mL Intravenous Q12H  . torsemide  20 mg Oral Daily   Continuous Infusions: . sodium chloride     PRN Meds: sodium chloride, acetaminophen, HYDROmorphone, nitroGLYCERIN, ondansetron (ZOFRAN) IV, oxyCODONE, sodium chloride flush   Vital Signs    Vitals:   08/16/19 0321 08/16/19 0323 08/16/19 0325 08/16/19 0820  BP: (!) 159/84 (!) 160/81 (!) 162/74 (!) 162/99  Pulse: 83 82 83 82  Resp: 14 11 12    Temp: 98.4 F (36.9 C)   98 F (36.7 C)  TempSrc: Oral   Oral  SpO2: 99% 100% 100% 93%  Weight:   116.8 kg   Height:        Intake/Output Summary (Last 24 hours) at 08/16/2019 1314 Last data filed at 08/16/2019 1021 Gross per 24 hour  Intake 243 ml  Output 2625 ml  Net -2382 ml   Last 3 Weights 08/16/2019 08/15/2019 08/15/2019  Weight (lbs) 257 lb 6.4 oz 263 lb 4.8 oz 263 lb 4.8 oz  Weight (kg) 116.756 kg 119.432 kg 119.432 kg      Telemetry    Sinus rhythm with frequent PVCs.- Personally Reviewed  ECG  None obtained today  Physical Exam   GEN: No  acute distress.   Neck: No JVD Cardiac: RRR, no murmurs, rubs, or gallops.  Respiratory:  Decreased breath sounds at bases, clear anteriorly GI: Soft, nontender, non-distended  MS:  Trace edema; No deformity. Neuro:  Nonfocal  Psych: Normal affect   Labs    High Sensitivity Troponin:   Recent Labs  Lab 07/27/19 1711 07/27/19 1909 07/28/19 0303 07/28/19 0547 08/14/19 1428  TROPONINIHS 32* 55* 35* 31* 28*      Chemistry Recent Labs  Lab 08/14/19 1428 08/15/19 0451 08/16/19 0543  NA 138 140 142  K 4.1 3.8 3.9  CL 100 103 102  CO2 26 27 27   GLUCOSE 270* 240* 211*  BUN 64* 62* 59*  CREATININE 3.65* 3.43* 3.45*  CALCIUM 8.4* 8.1* 8.5*  GFRNONAA 18* 19* 19*  GFRAA 20* 22* 22*  ANIONGAP 12 10 13      Hematology Recent Labs  Lab 08/14/19 1428 08/16/19 0543  WBC 9.1 8.4  RBC 2.65* 2.65*  HGB 7.8* 7.7*  HCT 25.0* 25.1*  MCV 94.3 94.7  MCH 29.4 29.1  MCHC 31.2 30.7  RDW 15.3 15.7*  PLT 337 288    BNP Recent Labs  Lab 08/14/19  1428  BNP 2,227.0*     DDimer No results for input(s): DDIMER in the last 168 hours.   Radiology    Dg Chest 2 View  Result Date: 08/14/2019 CLINICAL DATA:  Shortness of breath.  Renal failure EXAM: CHEST - 2 VIEW COMPARISON:  July 27, 2019 FINDINGS: There is no edema or consolidation. There is slight cardiomegaly with pulmonary vascularity within normal limits. No adenopathy. Patient is status post coronary artery bypass grafting. No bone lesions. IMPRESSION: Mild cardiomegaly. Status post coronary artery bypass grafting. No evident edema or consolidation. Electronically Signed   By: Lowella Grip III M.D.   On: 08/14/2019 17:10   Nm Pulmonary Perf And Vent  Result Date: 08/15/2019 CLINICAL DATA:  Shortness of breath EXAM: NUCLEAR MEDICINE VENTILATION - PERFUSION LUNG SCAN VIEWS: Anterior, posterior, left lateral, right lateral, RPO, LPO, RAO, LAO-ventilation and perfusion. RADIOPHARMACEUTICALS:  32.743 mCi of Tc-37m DTPA  aerosol inhalation and 4.134 mCi Tc40m MAA IV COMPARISON:  Chest radiograph August 14, 2019 FINDINGS: Ventilation: Radiotracer uptake is homogeneous and symmetric bilaterally. No ventilation defects evident. Perfusion: Radiotracer uptake is homogeneous and symmetric bilaterally. No perfusion defects evident. Heart is noted to be prominent. IMPRESSION: No appreciable ventilation or perfusion defects. Very low probability of pulmonary embolus. Prominent cardiac silhouette. Electronically Signed   By: Lowella Grip III M.D.   On: 08/15/2019 13:44    Cardiac Studies   None obtained today  Patient Profile     58 y.o. male with history of heart failure reduced ejection fraction, last EF 30 to 35% who presents due to an episode of passing out and found to be hypoxic.Marland Kitchen  While in the hospital he was noted to have an episode of nonsustained VT.  Assessment & Plan    1. nonsustained VT -Patient to be evaluated by EP if available in-house. -If BP not available patient will be discharged with LifeVest to be seen as outpatient for ICD consideration  2.  Ischemic cardiomyopathy, last EF 30-35 -Continue Coreg hydralazine/isosorbide mononitrate -Continue aspirin, Lipitor -Continue diuresis with Lasix -Monitor renal function closely  3.  Hyperlipidemia -Continue Lipitor    For questions or updates, please contact [place] Please consult [email or web address] for contact info under        Signed, Kate Sable, MD  08/16/2019, 1:14 PM

## 2019-08-16 NOTE — Progress Notes (Signed)
Reds Vest performed on pt... 39%  Conley Simmonds, RN, BSN

## 2019-08-16 NOTE — Plan of Care (Signed)
  Problem: Clinical Measurements: Goal: Respiratory complications will improve Outcome: Progressing Note: On room air   Problem: Activity: Goal: Risk for activity intolerance will decrease Outcome: Progressing Note: Up independently in room, tolerating well   Problem: Nutrition: Goal: Adequate nutrition will be maintained Outcome: Progressing   Problem: Coping: Goal: Level of anxiety will decrease Outcome: Progressing   Problem: Elimination: Goal: Will not experience complications related to urinary retention Outcome: Progressing   Problem: Pain Managment: Goal: General experience of comfort will improve Outcome: Progressing Note: No complaints of pain  this shift   Problem: Safety: Goal: Ability to remain free from injury will improve Outcome: Progressing   Problem: Skin Integrity: Goal: Risk for impaired skin integrity will decrease Outcome: Progressing   Problem: Education: Goal: Knowledge of General Education information will improve Description: Including pain rating scale, medication(s)/side effects and non-pharmacologic comfort measures Outcome: Completed/Met

## 2019-08-16 NOTE — Progress Notes (Signed)
[facility]  ROUNDING NOTE   Subjective:   Patient had furosemide 80mg  IV x 1 yesterday. UOP 3170mL.   Off Oxygen while laying in bed.   Wife on phone.   Weight down to 116.8kg.   Objective:  Vital signs in last 24 hours:  Temp:  [98 F (36.7 C)-98.4 F (36.9 C)] 98 F (36.7 C) (09/30 0820) Pulse Rate:  [79-83] 82 (09/30 0820) Resp:  [11-20] 12 (09/30 0325) BP: (159-169)/(74-118) 162/99 (09/30 0820) SpO2:  [93 %-100 %] 93 % (09/30 0820) Weight:  [116.8 kg] 116.8 kg (09/30 0325)  Weight change: -3.447 kg Filed Weights   08/15/19 0017 08/15/19 0322 08/16/19 0325  Weight: 119.4 kg 119.4 kg 116.8 kg    Intake/Output: I/O last 3 completed shifts: In: 243 [P.O.:240; I.V.:3] Out: 3725 [Urine:3725]   Intake/Output this shift:  No intake/output data recorded.  Physical Exam: General: NAD,   Head: Normocephalic, atraumatic. Moist oral mucosal membranes  Eyes: Anicteric, PERRL  Neck: Supple, trachea midline  Lungs:  Clear to auscultation  Heart: Regular rate and rhythm  Abdomen:  Soft, nontender,   Extremities:  no peripheral edema.  Neurologic: Nonfocal, moving all four extremities  Skin: No lesions  Access: none    Basic Metabolic Panel: Recent Labs  Lab 08/14/19 1428 08/15/19 0451 08/16/19 0543  NA 138 140 142  K 4.1 3.8 3.9  CL 100 103 102  CO2 26 27 27   GLUCOSE 270* 240* 211*  BUN 64* 62* 59*  CREATININE 3.65* 3.43* 3.45*  CALCIUM 8.4* 8.1* 8.5*  MG 2.2  --   --   PHOS 4.3  --   --     Liver Function Tests: No results for input(s): AST, ALT, ALKPHOS, BILITOT, PROT, ALBUMIN in the last 168 hours. No results for input(s): LIPASE, AMYLASE in the last 168 hours. No results for input(s): AMMONIA in the last 168 hours.  CBC: Recent Labs  Lab 08/14/19 1428 08/16/19 0543  WBC 9.1 8.4  HGB 7.8* 7.7*  HCT 25.0* 25.1*  MCV 94.3 94.7  PLT 337 288    Cardiac Enzymes: No results for input(s): CKTOTAL, CKMB, CKMBINDEX, TROPONINI in the  last 168 hours.  BNP: Invalid input(s): POCBNP  CBG: Recent Labs  Lab 08/14/19 1421 08/15/19 1146 08/15/19 1658 08/15/19 2130 08/16/19 0821  GLUCAP 258* 281* 263* 202* 194*    Microbiology: Results for orders placed or performed during the hospital encounter of 08/14/19  SARS CORONAVIRUS 2 (TAT 6-24 HRS) Nasopharyngeal Nasopharyngeal Swab     Status: None   Collection Time: 08/14/19  9:05 PM   Specimen: Nasopharyngeal Swab  Result Value Ref Range Status   SARS Coronavirus 2 NEGATIVE NEGATIVE Final    Comment: (NOTE) SARS-CoV-2 target nucleic acids are NOT DETECTED. The SARS-CoV-2 RNA is generally detectable in upper and lower respiratory specimens during the acute phase of infection. Negative results do not preclude SARS-CoV-2 infection, do not rule out co-infections with other pathogens, and should not be used as the sole basis for treatment or other patient management decisions. Negative results must be combined with clinical observations, patient history, and epidemiological information. The expected result is Negative. Fact Sheet for Patients: SugarRoll.be Fact Sheet for Healthcare Providers: https://www.woods-mathews.com/ This test is not yet approved or cleared by the [place] FDA and  has been authorized for detection and/or diagnosis of SARS-CoV-2 by FDA under an Emergency Use Authorization (EUA). This EUA will remain  in effect (meaning this test can be used) for the  duration of the COVID-19 declaration under Section 56 4(b)(1) of the Act, 21 U.S.C. section 360bbb-3(b)(1), unless the authorization is terminated or revoked sooner. Performed at [facility], [place] [address], [place], [place] [address]     Coagulation Studies: No results for input(s): LABPROT, INR in the last 72 hours.  Urinalysis: Recent Labs    08/14/19 1724  COLORURINE YELLOW*  LABSPEC 1.011  PHURINE 7.0  GLUCOSEU >=500*  HGBUR  NEGATIVE  BILIRUBINUR NEGATIVE  KETONESUR NEGATIVE  PROTEINUR 100*  NITRITE NEGATIVE  LEUKOCYTESUR NEGATIVE      Imaging: Dg Chest 2 View  Result Date: 08/14/2019 CLINICAL DATA:  Shortness of breath.  Renal failure EXAM: CHEST - 2 VIEW COMPARISON:  July 27, 2019 FINDINGS: There is no edema or consolidation. There is slight cardiomegaly with pulmonary vascularity within normal limits. No adenopathy. Patient is status post coronary artery bypass grafting. No bone lesions. IMPRESSION: Mild cardiomegaly. Status post coronary artery bypass grafting. No evident edema or consolidation. Electronically Signed   By: Lowella Grip III M.D.   On: 08/14/2019 17:10   Nm Pulmonary Perf And Vent  Result Date: 08/15/2019 CLINICAL DATA:  Shortness of breath EXAM: NUCLEAR MEDICINE VENTILATION - PERFUSION LUNG SCAN VIEWS: Anterior, posterior, left lateral, right lateral, RPO, LPO, RAO, LAO-ventilation and perfusion. RADIOPHARMACEUTICALS:  32.743 mCi of Tc-78m DTPA aerosol inhalation and 4.134 mCi Tc38m MAA IV COMPARISON:  Chest radiograph August 14, 2019 FINDINGS: Ventilation: Radiotracer uptake is homogeneous and symmetric bilaterally. No ventilation defects evident. Perfusion: Radiotracer uptake is homogeneous and symmetric bilaterally. No perfusion defects evident. Heart is noted to be prominent. IMPRESSION: No appreciable ventilation or perfusion defects. Very low probability of pulmonary embolus. Prominent cardiac silhouette. Electronically Signed   By: Lowella Grip III M.D.   On: 08/15/2019 13:44     Medications:   . sodium chloride     . aspirin EC  81 mg Oral Daily  . atorvastatin  40 mg Oral QHS  . carvedilol  25 mg Oral QHS  . famotidine  20 mg Oral QHS  . ferrous sulfate  325 mg Oral Daily  . furosemide  40 mg Intravenous Once  . heparin  5,000 Units Subcutaneous Q8H  . hydrALAZINE  25 mg Oral TID  . insulin aspart  0-9 Units Subcutaneous TID WC  . insulin detemir  15 Units  Subcutaneous Daily  . isosorbide mononitrate  30 mg Oral Daily  . multivitamin  1 tablet Oral QHS  . sodium bicarbonate  1,300 mg Oral BID  . sodium chloride flush  3 mL Intravenous Q12H  . torsemide  20 mg Oral Daily   sodium chloride, acetaminophen, HYDROmorphone, nitroGLYCERIN, ondansetron (ZOFRAN) IV, oxyCODONE, sodium chloride flush  Assessment/ Plan:  [name] is a 57 y.o. white male  withhypertension, diabetes mellitus type II, diabetic neuropathy, systolic congestive heart failure, peripheral vascular disease status post left toe amputations, autonomic neuropathy, coronary artery disease status post CABG, history of osteomyelitis, who is admitted for DOE (dyspnea on exertion) [R06.09]   1. Acute kidney failure on chronic kidney disease stage IV: baseline creatinine of 2.42, GFR of 29 on 02/23/19.  Required hemodialysis on last admission.  - sodium bicarbonate.   2. Acute exacerbation of chronic systolic congestive heart failure.  - PO torsemide - Extra dose of furosemide IV bolus today.   3. Hypertension:   - carvedilol, torsemide, isosorbide mononitrate, and hydralazine.   4. Anemia of chronic kidney disease: normocytic. Hemoglobin 7.8  LOS: 2 Haylee Mcanany 9/30/20209:18 AM

## 2019-08-17 ENCOUNTER — Ambulatory Visit: Payer: Medicare HMO | Admitting: Internal Medicine

## 2019-08-17 DIAGNOSIS — I255 Ischemic cardiomyopathy: Secondary | ICD-10-CM

## 2019-08-17 DIAGNOSIS — I25118 Atherosclerotic heart disease of native coronary artery with other forms of angina pectoris: Secondary | ICD-10-CM

## 2019-08-17 DIAGNOSIS — Z9114 Patient's other noncompliance with medication regimen: Secondary | ICD-10-CM

## 2019-08-17 DIAGNOSIS — I501 Left ventricular failure: Secondary | ICD-10-CM

## 2019-08-17 LAB — BASIC METABOLIC PANEL
Anion gap: 11 (ref 5–15)
BUN: 58 mg/dL — ABNORMAL HIGH (ref 6–20)
CO2: 26 mmol/L (ref 22–32)
Calcium: 8.4 mg/dL — ABNORMAL LOW (ref 8.9–10.3)
Chloride: 105 mmol/L (ref 98–111)
Creatinine, Ser: 3.33 mg/dL — ABNORMAL HIGH (ref 0.61–1.24)
GFR calc Af Amer: 23 mL/min — ABNORMAL LOW (ref >60)
GFR calc non Af Amer: 20 mL/min — ABNORMAL LOW (ref >60)
Glucose, Bld: 200 mg/dL — ABNORMAL HIGH (ref 70–99)
Potassium: 3.5 mmol/L (ref 3.5–5.1)
Sodium: 142 mmol/L (ref 135–145)

## 2019-08-17 LAB — GLUCOSE, CAPILLARY
Glucose-Capillary: 187 mg/dL — ABNORMAL HIGH (ref 70–99)
Glucose-Capillary: 230 mg/dL — ABNORMAL HIGH (ref 70–99)
Glucose-Capillary: 215 mg/dL — ABNORMAL HIGH (ref 70–99)
Glucose-Capillary: 163 mg/dL — ABNORMAL HIGH (ref 70–99)

## 2019-08-17 MED ORDER — POTASSIUM CHLORIDE CRYS ER 20 MEQ PO TBCR
20.0000 meq | EXTENDED_RELEASE_TABLET | Freq: Every day | ORAL | Status: DC
  Administered 2019-08-17 – 2019-08-18 (×2): 20 meq via ORAL
  Filled 2019-08-17 (×2): qty 1

## 2019-08-17 MED ORDER — TORSEMIDE 20 MG PO TABS
40.0000 mg | ORAL_TABLET | Freq: Every day | ORAL | Status: DC
  Administered 2019-08-18: 40 mg via ORAL
  Filled 2019-08-17: qty 2

## 2019-08-17 MED ORDER — ISOSORBIDE MONONITRATE ER 30 MG PO TB24
30.0000 mg | ORAL_TABLET | Freq: Two times a day (BID) | ORAL | Status: DC
  Administered 2019-08-17 – 2019-08-18 (×2): 30 mg via ORAL
  Filled 2019-08-17 (×2): qty 1

## 2019-08-17 MED ORDER — TORSEMIDE 20 MG PO TABS
20.0000 mg | ORAL_TABLET | Freq: Once | ORAL | Status: AC
  Administered 2019-08-17: 20 mg via ORAL
  Filled 2019-08-17: qty 1

## 2019-08-17 MED ORDER — HYDRALAZINE HCL 50 MG PO TABS
50.0000 mg | ORAL_TABLET | Freq: Three times a day (TID) | ORAL | Status: DC
  Administered 2019-08-17 – 2019-08-18 (×3): 50 mg via ORAL
  Filled 2019-08-17 (×3): qty 1

## 2019-08-17 NOTE — TOC Initial Note (Signed)
Transition of Care [facility]) - Initial/Assessment Note    Patient Details  Name: [name] [name] MRN: [id number] Date of Birth: [birth year]  Transition of [facility]) CM/SW Contact:    Ross Ludwig, LCSW Phone Number: 08/17/2019, 6:16 PM  Clinical Narrative:                 Patient is a 58 year old male who is married and lives with his wife.  Patient states that he does not need any home health or equipment.  Patient was recommended to have a [employer] life vest.  CSW spoke to patient and he is agreeable to having a life vest.  CSW contacted Tammy at [facility], [place] completed the paperwork and faxed over clinical information to [place].  Tammy informed CSW that the [employer]oll life vest fitter will not be able to arrive at hospital today, but she will be able to arrive tomorrow.  Patient and physician were updated of the situation.  Patient is agreeable to waiting for the [employer] life vest person to arrive tomorrow.  CSW to continue to facilitate discharge planning.  Expected Discharge Plan: Home/Self Care Barriers to Discharge: Equipment Delay   Patient Goals and CMS Choice Patient states their goals for this hospitalization and ongoing recovery are:: To receive the [employer]oll life vest and then return back home. CMS Medicare.gov Compare Post Acute Care list provided to:: Patient Choice offered to / list presented to : Patient  Expected Discharge Plan and Services Expected Discharge Plan: Home/Self Care       Living arrangements for the past 2 months: Single Family Home                 DME Arranged: N/A           HH Agency: NA        Prior Living Arrangements/Services Living arrangements for the past 2 months: Single Family Home Lives with:: Spouse Patient language and need for interpreter reviewed:: Yes Do you feel safe going back to the place where you live?: Yes      Need for Family Participation in Patient Care: No (Comment) Care giver support system in place?: No (comment)   Criminal  Activity/Legal Involvement Pertinent to Current Situation/Hospitalization: No - Comment as needed  Activities of Daily Living Home Assistive Devices/Equipment: CBG Meter, Eyeglasses, Blood pressure cuff, Scales ADL Screening (condition at time of admission) Patient's cognitive ability adequate to safely complete daily activities?: Yes Is the patient deaf or have difficulty hearing?: No Does the patient have difficulty seeing, even when wearing glasses/contacts?: No Does the patient have difficulty concentrating, remembering, or making decisions?: No Patient able to express need for assistance with ADLs?: Yes Does the patient have difficulty dressing or bathing?: No Independently performs ADLs?: Yes (appropriate for developmental age) Does the patient have difficulty walking or climbing stairs?: Yes Weakness of Legs: None Weakness of Arms/Hands: None  Permission Sought/Granted Permission sought to share information with : Family Supports, Other (comment)([employer] life vest representative) Permission granted to share information with : Yes, Verbal Permission Granted  Share Information with NAME: D[place], [place] [phone]  Permission granted to share info w AGENCY: [employer]        Emotional Assessment Appearance:: Appears stated age Attitude/Demeanor/Rapport: Engaged Affect (typically observed): Accepting, Calm, Appropriate, Pleasant Orientation: : Oriented to Self, Oriented to Place, Oriented to  Time, Oriented to Situation Alcohol / Substance Use: Not Applicable Psych Involvement: No (comment)  Admission diagnosis:  DOE (dyspnea on exertion) [R06.00] Patient Active Problem  List   Diagnosis Date Noted  . Syncope and collapse   . Acute on chronic systolic CHF (congestive heart failure) ([place]) 08/14/2019  . Acute midline low back pain without sciatica   . Acute on chronic respiratory failure with hypoxemia ([place]) 07/28/2019  . Acute renal failure (ARF) ([place]) 07/12/2019  . Acute renal  failure superimposed on stage 4 chronic kidney disease ([place]) 07/10/2019  . NSVT (nonsustained ventricular tachycardia) ([place]) 11/22/2018  . Chronic systolic heart failure ([place]) 10/12/2018  . HTN (hypertension) 10/12/2018  . Preop cardiovascular exam 12/10/2016  . Hypertensive heart disease   . Ischemic cardiomyopathy   . Hypercholesterolemia   . CKD (chronic kidney disease), stage III   . Acute on chronic combined systolic and diastolic CHF (congestive heart failure) ([place]) 09/19/2014  . Hyperlipidemia 07/31/2014  . Orthostatic hypotension 06/15/2014  . Chronic kidney disease 05/21/2014  . Chronic osteomyelitis of toe of left foot ([place]) 05/21/2014  . Diabetic ulcer of left foot ([place]) 05/21/2014  . Physical deconditioning 05/15/2014  . S/P CABG x 4 05/13/2014  . Diabetes mellitus type 2 with complications ([place]) 24/23/5361  . CAD (coronary artery disease)    PCP:  Tracie Harrier, MD Pharmacy:   [facility], [place] [address] Phone: [phone] Fax: [phone]  [id number] [address], [place] [address] Phone: [phone] Fax: [phone]     Social Determinants of Health (SDOH) Interventions    Readmission Risk Interventions Readmission Risk Prevention Plan 08/01/2019  Transportation Screening Complete  PCP or Specialist Appt within 3-5 Days Complete  HRI or Home Care Consult Complete  Social Work Consult for [place] Planning/Counseling Complete  Palliative Care Screening Not Applicable  Medication Review Press photographer) Complete  Some recent data might be hidden

## 2019-08-17 NOTE — Progress Notes (Signed)
ReVs vest reading 36%

## 2019-08-17 NOTE — Progress Notes (Signed)
[facility] NAME: [name] [name]    MR#:  [id number]  DATE OF BIRTH:  [birth year]  SUBJECTIVE:  Feels slightly better after diuresis. Had a lot of question about getting AICD evaluation. Patient and wife had concern today about hypoxia and requirement of oxygen at home. REVIEW OF SYSTEMS:    Review of Systems  Constitutional: Negative for fever, chills weight loss HENT: Negative for ear pain, nosebleeds, congestion, facial swelling, rhinorrhea, neck pain, neck stiffness and ear discharge.   Respiratory: Negative for cough, ++shortness of breath, no wheezing  Cardiovascular: Negative for chest pain, palpitations and leg swelling.  Gastrointestinal: Negative for heartburn, abdominal pain, vomiting, diarrhea or consitpation Genitourinary: Negative for dysuri a, urgency, frequency, hematuria Musculoskeletal: Negative for back pain or joint pain Neurological: Negative for dizziness, seizures, syncope, focal weakness,  numbness and headaches.  Hematological: Does not bruise/bleed easily.  Psychiatric/Behavioral: Negative for hallucinations, confusion, dysphoric mood   Tolerating Diet: yes   DRUG ALLERGIES:   Allergies  Allergen Reactions  . 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 Doesn't believe he has an allergy to PCN as he has taken it previously, according to wife.  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 Doesn't believe he has an allergy to PCN as he has taken it previously, according to wife.    VITALS:  Blood pressure (!) 142/75, pulse 79, temperature 98.6 F (37 C), temperature source Oral, resp. rate 19, height 6\' 1"  (1.854 m), weight 114.7 kg, SpO2 99 %.  PHYSICAL EXAMINATION:  Constitutional: Appears well-developed and well-nourished. No distress. HENT: Normocephalic. Marland Kitchen Oropharynx  is clear and moist.  Eyes: Conjunctivae and EOM are normal. PERRLA, no scleral icterus.  Neck: Normal ROM. Neck supple. No JVD. No tracheal deviation. CVS: RRR, S1/S2 +, no murmurs, no gallops, no carotid bruit.  Pulmonary: Effort and breath sounds normal, no stridor, rhonchi, wheezes, rales.  Abdominal: Soft. BS +,  no distension, tenderness, rebound or guarding.  Patient and wife still feels his abdomen is little bit swollen than his baseline. Musculoskeletal: Normal range of motion. 1+ LEE and no tenderness.  Neuro: Alert. CN 2-12 grossly intact. No focal deficits. Skin: Skin is warm and dry. No rash noted. Psychiatric: Normal mood and affect.    LABORATORY PANEL:   CBC Recent Labs  Lab 08/16/19 0543  WBC 8.4  HGB 7.7*  HCT 25.1*  PLT 288   ------------------------------------------------------------------------------------------------------------------  Chemistries  Recent Labs  Lab 08/14/19 1428  08/17/19 0524  NA 138   < > 142  K 4.1   < > 3.5  CL 100   < > 105  CO2 26   < > 26  GLUCOSE 270*   < > 200*  BUN 64*   < > 58*  CREATININE 3.65*   < > 3.33*  CALCIUM 8.4*   < > 8.4*  MG 2.2  --   --    < > = values in this interval not displayed.   ------------------------------------------------------------------------------------------------------------------  Cardiac Enzymes No results for input(s): TROPONINI in the last 168 hours. ------------------------------------------------------------------------------------------------------------------  RADIOLOGY:  No results found.   ASSESSMENT AND PLAN:   58 year old male with CAD status post four-vessel CABG, chronic systolic heart failure ejection fraction 30 to 35%, PSVT and poorly controlled diabetes who presents to the emergency room due to shortness of breath and syncope.  1.  Syncope  with hypoxia: Concern may be for transient arrhythmia with greatest concern for V. tach.  Patient had asymptomatic 19 beat  nonsustained V. tach on telemetry - now in NSR  VQ scan negative for PE. Most recent echocardiogram showed ejection fraction 30 to 35% with no significant valvular disease. Appreciate Cardiology consult, plan for ICD as out pt, currently arranging for getting LifeVest placed before discharge. CHF vest checked FLuid level 38% today in lungs.  2.  Acute on chronic systolic heart failure ejection fraction 30 to 35%: Patient has agreed on torsemide 20 mg daily. Patient currently euvolemic as per cardiology and nephrology. Renal function precludes using ACEI/ARB/Entressto   3.  Chronic kidney disease with baseline creatinine between 3-3.2 stage IV: Avoid nephrotoxic medications Appreciate nephrology consultation Will need to have outpatient follow-up with Dr. Candiss Norse at discharge. Continue sodium bicarb  4.  Status post four-vessel CABG: Patient has ruled out for ACS during this admission. Continue Coreg, hydralazine, Imdur, statin aspirin   5.  Essential hypertension: Continue hydralazine, Coreg, Imdur  6.  Diabetes: Continue Levemir with sliding scale  7.  Anemia of chronic disease: Transfuse if hemoglobin less than 7. Continue ferrous sulfate Discussed with cardiology and nephrology  -Patient had hypoxia on exertion for last 2 days.  With continued diuresis and having less fluid in the lungs today he did not had hypoxia on walking.  So he does not require home oxygen now.  Management plans discussed with the patient and he is in agreement.  CODE STATUS: Full  TOTAL TIME TAKING CARE OF THIS PATIENT: 56 minutes.   Discussed with patient's wife in the room.  Waiting for LifeVest arrangement at home.  POSSIBLE D/C 1 to 2 days, DEPENDING ON CLINICAL CONDITION.   Vaughan Basta M.D on 08/17/2019 at 4:32 PM  Between 7am to 6pm - Pager - [phone] After 6pm go to [email or web address] - password [email or web address] [facility]  Office  [phone]  CC: Primary care  physician; Tracie Harrier, MD  Note: This dictation was prepared with Dragon dictation along with smaller phrase technology. Any transcriptional errors that result from this process are unintentional.

## 2019-08-17 NOTE — Progress Notes (Signed)
SATURATION QUALIFICATIONS: (This note is used to comply with regulatory documentation for home oxygen)  Patient Saturations on Room Air at Rest = 96  Patient Saturations on Room Air while Ambulating = 94   

## 2019-08-17 NOTE — Care Management Important Message (Signed)
Important Message  Patient Details  Name: [name] [name] MRN: [id number] Date of Birth: 03-23-1961   Medicare Important Message Given:  Yes     Dannette Barbara 08/17/2019, 1:50 PM

## 2019-08-17 NOTE — Progress Notes (Signed)
Progress Note  Patient Name: [name] [name] Date of Encounter: 08/17/2019  Primary Cardiologist: Ida Rogue, MD , [facility]  Subjective   No events overnight No significant arrhythmia He would like to go home today REDS vest score yesterday 39% Weight down to 252 pounds this morning Denies having abdominal bloating, no leg swelling Reports is not taking any diuretic at home.   (On last clinic visit January 2020 was taking Lasix 20 daily) (He was discharged August 08, 2019 on torsemide 40 daily) History of medication noncompliance  Inpatient Medications    Scheduled Meds: . aspirin EC  81 mg Oral Daily  . atorvastatin  40 mg Oral QHS  . carvedilol  25 mg Oral BID WC  . famotidine  20 mg Oral QHS  . ferrous sulfate  325 mg Oral Daily  . heparin  5,000 Units Subcutaneous Q8H  . hydrALAZINE  25 mg Oral TID  . insulin aspart  0-9 Units Subcutaneous TID WC  . insulin detemir  15 Units Subcutaneous Daily  . isosorbide mononitrate  30 mg Oral Daily  . multivitamin  1 tablet Oral QHS  . potassium chloride  20 mEq Oral Daily  . sodium bicarbonate  1,300 mg Oral BID  . sodium chloride flush  3 mL Intravenous Q12H  . [START ON 08/18/2019] torsemide  40 mg Oral Daily   Continuous Infusions: . sodium chloride     PRN Meds: sodium chloride, acetaminophen, HYDROmorphone, nitroGLYCERIN, ondansetron (ZOFRAN) IV, oxyCODONE, sodium chloride flush   Vital Signs    Vitals:   08/16/19 1722 08/16/19 2041 08/17/19 0319 08/17/19 0756  BP: (!) 175/88 (!) 164/80 (!) 147/80 (!) 167/86  Pulse: 84 78 87 87  Resp: 18 18 14 19   Temp:  98.5 F (36.9 C) 99 F (37.2 C) 98.5 F (36.9 C)  TempSrc:  Oral Oral Oral  SpO2: 98% 97% 96% 94%  Weight:   114.7 kg   Height:        Intake/Output Summary (Last 24 hours) at 08/17/2019 1032 Last data filed at 08/17/2019 0555 Gross per 24 hour  Intake 240 ml  Output 1550 ml  Net -1310 ml   Last 3 Weights 08/17/2019 08/16/2019 08/15/2019  Weight  (lbs) 252 lb 12.8 oz 257 lb 6.4 oz 263 lb 4.8 oz  Weight (kg) 114.669 kg 116.756 kg 119.432 kg      Telemetry    NSR, frequent PVCs, couplets- Personally Reviewed  ECG     - Personally Reviewed  Physical Exam   GEN: No acute distress.   Neck:  JVD 8+ Cardiac: RRR, no murmurs, rubs, or gallops.  Respiratory: Clear to auscultation bilaterally. GI: Soft, nontender, non-distended  MS: No edema; No deformity. Neuro:  Nonfocal  Psych: Normal affect   Labs    High Sensitivity Troponin:   Recent Labs  Lab 07/27/19 1711 07/27/19 1909 07/28/19 0303 07/28/19 0547 08/14/19 1428  TROPONINIHS 32* 55* 35* 31* 28*      Chemistry Recent Labs  Lab 08/15/19 0451 08/16/19 0543 08/17/19 0524  NA 140 142 142  K 3.8 3.9 3.5  CL 103 102 105  CO2 27 27 26   GLUCOSE 240* 211* 200*  BUN 62* 59* 58*  CREATININE 3.43* 3.45* 3.33*  CALCIUM 8.1* 8.5* 8.4*  GFRNONAA 19* 19* 20*  GFRAA 22* 22* 23*  ANIONGAP 10 13 11      Hematology Recent Labs  Lab 08/14/19 1428 08/16/19 0543  WBC 9.1 8.4  RBC 2.65* 2.65*  HGB 7.8*  7.7*  HCT 25.0* 25.1*  MCV 94.3 94.7  MCH 29.4 29.1  MCHC 31.2 30.7  RDW 15.3 15.7*  PLT 337 288    BNP Recent Labs  Lab 08/14/19 1428  BNP 2,227.0*     DDimer No results for input(s): DDIMER in the last 168 hours.   Radiology    Nm Pulmonary Perf And Vent  Result Date: 08/15/2019 CLINICAL DATA:  Shortness of breath EXAM: NUCLEAR MEDICINE VENTILATION - PERFUSION LUNG SCAN VIEWS: Anterior, posterior, left lateral, right lateral, RPO, LPO, RAO, LAO-ventilation and perfusion. RADIOPHARMACEUTICALS:  32.743 mCi of Tc-29m DTPA aerosol inhalation and 4.134 mCi Tc52m MAA IV COMPARISON:  Chest radiograph August 14, 2019 FINDINGS: Ventilation: Radiotracer uptake is homogeneous and symmetric bilaterally. No ventilation defects evident. Perfusion: Radiotracer uptake is homogeneous and symmetric bilaterally. No perfusion defects evident. Heart is noted to be  prominent. IMPRESSION: No appreciable ventilation or perfusion defects. Very low probability of pulmonary embolus. Prominent cardiac silhouette. Electronically Signed   By: Lowella Grip III M.D.   On: 08/15/2019 13:44    Cardiac Studies     Patient Profile     [name] [name] is a 58 y.o. male with a hx of CAD s/p four-vessel CABG (04/2014), HFrEF (EF 30-35%, 06/2019) secondary to ICM, NSVT, paroxysmal SVT, PVD, poorly controlled insulin-dependent diabetes, diabetic foot ulcers and diabetic peripheral neuropathyrequiring amputations of the left toes, osteomyelitis, CKD stage IV, hypertension, hyperlipidemia, asymptomatic orthostatic hypotension, medication compliance issues secondary to financial issues,andwho is being seen today per request of Rufina Falco, NP for the evaluation ofshortness of breathand recent episode of LOC while seated.  Assessment & Plan    1.  Syncope Hypoxia on arrival in the setting of CHF Also concern for sustained VT Dr. Saunders Revel evaluated telemetry showing nonsustained VT 19 beats EP unable to evaluate patient on an inpatient basis -LifeVest has been mentioned to him since his admission,  This will be arranged.  Order has been placed by myself Ejection fraction 30 to 35% previous MI, known ischemic cardiomyopathy, nonsustained VT with syncope -Long discussion with patient and patient's wife concerning above  2. CAD stable angina No further ischemic work-up at this time  3.  Pulmonary edema History of medication noncompliance Reported he was not taking his diuretics at home He clearly was sent home August 08, 2019 with torsemide 40 daily -Stressed importance of medication compliance  4.  Essential hypertension Well-controlled blood pressure Typically poorly controlled when seen in the office secondary to medication noncompliance -Elevated blood pressure on arrival to the hospital  Blood pressure continues to run high this hospital course  -Recommend increasing hydralazine up to 50 3 times daily and isosorbide 30 twice daily Continue carvedilol   Orders placed for Zoll vest, on discussion with patient patient's wife concerning the above, medication changes, stressed need for medication compliance  Total encounter time more than 45 minutes  Greater than 50% was spent in counseling and coordination of care with the patient    For questions or updates, please contact [place] Please consult [email or web address] for contact info under        Signed, Ida Rogue, MD  08/17/2019, 10:32 AM

## 2019-08-17 NOTE — Progress Notes (Signed)
[facility] NAME: [name] [name]    MR#:  [id number]  DATE OF BIRTH:  [birth year]  SUBJECTIVE:  Feels slightly better after diuresis. Had a lot of question about getting AICD evaluation. REVIEW OF SYSTEMS:    Review of Systems  Constitutional: Negative for fever, chills weight loss HENT: Negative for ear pain, nosebleeds, congestion, facial swelling, rhinorrhea, neck pain, neck stiffness and ear discharge.   Respiratory: Negative for cough, ++shortness of breath, no wheezing  Cardiovascular: Negative for chest pain, palpitations and leg swelling.  Gastrointestinal: Negative for heartburn, abdominal pain, vomiting, diarrhea or consitpation Genitourinary: Negative for dysuri a, urgency, frequency, hematuria Musculoskeletal: Negative for back pain or joint pain Neurological: Negative for dizziness, seizures, syncope, focal weakness,  numbness and headaches.  Hematological: Does not bruise/bleed easily.  Psychiatric/Behavioral: Negative for hallucinations, confusion, dysphoric mood    Tolerating Diet: yes   DRUG ALLERGIES:   Allergies  Allergen Reactions  . 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 Doesn't believe he has an allergy to PCN as he has taken it previously, according to wife.  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 Doesn't believe he has an allergy to PCN as he has taken it previously, according to wife.    VITALS:  Blood pressure (!) 167/86, pulse 87, temperature 98.5 F (36.9 C), temperature source Oral, resp. rate 19, height 6\' 1"  (1.854 m), weight 114.7 kg, SpO2 94 %.  PHYSICAL EXAMINATION:  Constitutional: Appears well-developed and well-nourished. No distress. HENT: Normocephalic. Marland Kitchen Oropharynx is clear and moist.  Eyes: Conjunctivae and EOM are normal. PERRLA, no scleral  icterus.  Neck: Normal ROM. Neck supple. No JVD. No tracheal deviation. CVS: RRR, S1/S2 +, no murmurs, no gallops, no carotid bruit.  Pulmonary: Effort and breath sounds normal, no stridor, rhonchi, wheezes, rales.  Abdominal: Soft. BS +,  no distension, tenderness, rebound or guarding.  Musculoskeletal: Normal range of motion. 1+ LEE and no tenderness.  Neuro: Alert. CN 2-12 grossly intact. No focal deficits. Skin: Skin is warm and dry. No rash noted. Psychiatric: Normal mood and affect.    LABORATORY PANEL:   CBC Recent Labs  Lab 08/16/19 0543  WBC 8.4  HGB 7.7*  HCT 25.1*  PLT 288   ------------------------------------------------------------------------------------------------------------------  Chemistries  Recent Labs  Lab 08/14/19 1428  08/17/19 0524  NA 138   < > 142  K 4.1   < > 3.5  CL 100   < > 105  CO2 26   < > 26  GLUCOSE 270*   < > 200*  BUN 64*   < > 58*  CREATININE 3.65*   < > 3.33*  CALCIUM 8.4*   < > 8.4*  MG 2.2  --   --    < > = values in this interval not displayed.   ------------------------------------------------------------------------------------------------------------------  Cardiac Enzymes No results for input(s): TROPONINI in the last 168 hours. ------------------------------------------------------------------------------------------------------------------  RADIOLOGY:  Nm Pulmonary Perf And Vent  Result Date: 08/15/2019 CLINICAL DATA:  Shortness of breath EXAM: NUCLEAR MEDICINE VENTILATION - PERFUSION LUNG SCAN VIEWS: Anterior, posterior, left lateral, right lateral, RPO, LPO, RAO, LAO-ventilation and perfusion. RADIOPHARMACEUTICALS:  32.743 mCi of Tc-14m DTPA aerosol inhalation and 4.134 mCi Tc16m MAA IV COMPARISON:  Chest radiograph August 14, 2019 FINDINGS: Ventilation: Radiotracer uptake is homogeneous and symmetric bilaterally. No ventilation defects evident. Perfusion: Radiotracer uptake is homogeneous  and symmetric  bilaterally. No perfusion defects evident. Heart is noted to be prominent. IMPRESSION: No appreciable ventilation or perfusion defects. Very low probability of pulmonary embolus. Prominent cardiac silhouette. Electronically Signed   By: Lowella Grip III M.D.   On: 08/15/2019 13:44     ASSESSMENT AND PLAN:   58 year old male with CAD status post four-vessel CABG, chronic systolic heart failure ejection fraction 30 to 35%, PSVT and poorly controlled diabetes who presents to the emergency room due to shortness of breath and syncope.  1.  Syncope with hypoxia: Concern may be for transient arrhythmia with greatest concern for V. tach.  Patient had asymptomatic 19 beat nonsustained V. tach on telemetry with history ofNSVY/  VQ scan negative for PE. Most recent echocardiogram showed ejection fraction 30 to 35% with no significant valvular disease. Appreciate Cardiology consult, plan for ICD evaluation by EP.  2.  Acute on chronic systolic heart failure ejection fraction 30 to 35%: Patient has agreed on torsemide 20 mg daily. Patient currently euvolemic as per cardiology and nephrology. Renal function precludes using ACEI/ARB/Entressto   3.  Chronic kidney disease with baseline creatinine between 3-3.2 stage IV: Avoid nephrotoxic medications Appreciate nephrology consultation Will need to have outpatient follow-up with Dr. Candiss Norse at discharge. Continue sodium bicarb  4.  Status post four-vessel CABG: Patient has ruled out for ACS during this admission. Continue Coreg, hydralazine, Imdur, statin aspirin   5.  Essential hypertension: Continue hydralazine, Coreg, Imdur  6.  Diabetes: Continue Levemir with sliding scale  7.  Anemia of chronic disease: Transfuse if hemoglobin less than 7. Continue ferrous sulfate Discussed with cardiology and nephrology   Management plans discussed with the patient and he is in agreement.  CODE STATUS: Full  TOTAL TIME TAKING CARE OF THIS PATIENT:  30 minutes.   Discussed with patient's wife in the room.  POSSIBLE D/C 1 to 2 days, DEPENDING ON CLINICAL CONDITION.   Vaughan Basta M.D on 08/17/2019 at 8:22 AM  Between 7am to 6pm - Pager - [phone] After 6pm go to [email or web address] - password [email or web address] [facility]  Office  [phone]  CC: Primary care physician; Tracie Harrier, MD  Note: This dictation was prepared with Dragon dictation along with smaller phrase technology. Any transcriptional errors that result from this process are unintentional.

## 2019-08-17 NOTE — Progress Notes (Signed)
[facility]  ROUNDING NOTE   Subjective:   Patient had furosemide 40mg  IV x 1 yesterday. UOP 1539mL.   Off Oxygen while laying in bed.   Weight down to 114.7kg  Objective:  Vital signs in last 24 hours:  Temp:  [98.5 F (36.9 C)-99 F (37.2 C)] 98.5 F (36.9 C) (10/01 0756) Pulse Rate:  [78-87] 87 (10/01 0756) Resp:  [14-19] 19 (10/01 0756) BP: (147-175)/(80-88) 167/86 (10/01 0756) SpO2:  [94 %-98 %] 94 % (10/01 0756) Weight:  [114.7 kg] 114.7 kg (10/01 0319)  Weight change: -2.087 kg Filed Weights   08/15/19 0322 08/16/19 0325 08/17/19 0319  Weight: 119.4 kg 116.8 kg 114.7 kg    Intake/Output: I/O last 3 completed shifts: In: 483 [P.O.:480; I.V.:3] Out: 7517 [Urine:4175]   Intake/Output this shift:  No intake/output data recorded.  Physical Exam: General: NAD,   Head: Normocephalic, atraumatic. Moist oral mucosal membranes  Eyes: Anicteric, PERRL  Neck: Supple, trachea midline  Lungs:  Clear to auscultation  Heart: Regular rate and rhythm  Abdomen:  Soft, nontender,   Extremities:  no peripheral edema.  Neurologic: Nonfocal, moving all four extremities  Skin: No lesions       Basic Metabolic Panel: Recent Labs  Lab 08/14/19 1428 08/15/19 0451 08/16/19 0543 08/17/19 0524  NA 138 140 142 142  K 4.1 3.8 3.9 3.5  CL 100 103 102 105  CO2 26 27 27 26   GLUCOSE 270* 240* 211* 200*  BUN 64* 62* 59* 58*  CREATININE 3.65* 3.43* 3.45* 3.33*  CALCIUM 8.4* 8.1* 8.5* 8.4*  MG 2.2  --   --   --   PHOS 4.3  --   --   --     Liver Function Tests: No results for input(s): AST, ALT, ALKPHOS, BILITOT, PROT, ALBUMIN in the last 168 hours. No results for input(s): LIPASE, AMYLASE in the last 168 hours. No results for input(s): AMMONIA in the last 168 hours.  CBC: Recent Labs  Lab 08/14/19 1428 08/16/19 0543  WBC 9.1 8.4  HGB 7.8* 7.7*  HCT 25.0* 25.1*  MCV 94.3 94.7  PLT 337 288    Cardiac Enzymes: No results for input(s): CKTOTAL, CKMB,  CKMBINDEX, TROPONINI in the last 168 hours.  BNP: Invalid input(s): POCBNP  CBG: Recent Labs  Lab 08/16/19 0821 08/16/19 1147 08/16/19 1721 08/16/19 2105 08/17/19 0753  GLUCAP 194* 243* 213* 154* 187*    Microbiology: Results for orders placed or performed during the hospital encounter of 08/14/19  SARS CORONAVIRUS 2 (TAT 6-24 HRS) Nasopharyngeal Nasopharyngeal Swab     Status: None   Collection Time: 08/14/19  9:05 PM   Specimen: Nasopharyngeal Swab  Result Value Ref Range Status   SARS Coronavirus 2 NEGATIVE NEGATIVE Final    Comment: (NOTE) SARS-CoV-2 target nucleic acids are NOT DETECTED. The SARS-CoV-2 RNA is generally detectable in upper and lower respiratory specimens during the acute phase of infection. Negative results do not preclude SARS-CoV-2 infection, do not rule out co-infections with other pathogens, and should not be used as the sole basis for treatment or other patient management decisions. Negative results must be combined with clinical observations, patient history, and epidemiological information. The expected result is Negative. Fact Sheet for Patients: SugarRoll.be Fact Sheet for Healthcare Providers: https://www.woods-mathews.com/ This test is not yet approved or cleared by the [place] FDA and  has been authorized for detection and/or diagnosis of SARS-CoV-2 by FDA under an Emergency Use Authorization (EUA). This EUA will remain  in effect (meaning this test can be used) for the duration of the COVID-19 declaration under Section 56 4(b)(1) of the Act, 21 U.S.C. section 360bbb-3(b)(1), unless the authorization is terminated or revoked sooner. Performed at [facility], [place] [address], [place], [place] [address]     Coagulation Studies: No results for input(s): LABPROT, INR in the last 72 hours.  Urinalysis: Recent Labs    08/14/19 1724  COLORURINE YELLOW*  LABSPEC 1.011  PHURINE 7.0   GLUCOSEU >=500*  HGBUR NEGATIVE  BILIRUBINUR NEGATIVE  KETONESUR NEGATIVE  PROTEINUR 100*  NITRITE NEGATIVE  LEUKOCYTESUR NEGATIVE      Imaging: Nm Pulmonary Perf And Vent  Result Date: 08/15/2019 CLINICAL DATA:  Shortness of breath EXAM: NUCLEAR MEDICINE VENTILATION - PERFUSION LUNG SCAN VIEWS: Anterior, posterior, left lateral, right lateral, RPO, LPO, RAO, LAO-ventilation and perfusion. RADIOPHARMACEUTICALS:  32.743 mCi of Tc-49m DTPA aerosol inhalation and 4.134 mCi Tc18m MAA IV COMPARISON:  Chest radiograph August 14, 2019 FINDINGS: Ventilation: Radiotracer uptake is homogeneous and symmetric bilaterally. No ventilation defects evident. Perfusion: Radiotracer uptake is homogeneous and symmetric bilaterally. No perfusion defects evident. Heart is noted to be prominent. IMPRESSION: No appreciable ventilation or perfusion defects. Very low probability of pulmonary embolus. Prominent cardiac silhouette. Electronically Signed   By: Lowella Grip III M.D.   On: 08/15/2019 13:44     Medications:   . sodium chloride     . aspirin EC  81 mg Oral Daily  . atorvastatin  40 mg Oral QHS  . carvedilol  25 mg Oral BID WC  . famotidine  20 mg Oral QHS  . ferrous sulfate  325 mg Oral Daily  . heparin  5,000 Units Subcutaneous Q8H  . hydrALAZINE  25 mg Oral TID  . insulin aspart  0-9 Units Subcutaneous TID WC  . insulin detemir  15 Units Subcutaneous Daily  . isosorbide mononitrate  30 mg Oral Daily  . multivitamin  1 tablet Oral QHS  . sodium bicarbonate  1,300 mg Oral BID  . sodium chloride flush  3 mL Intravenous Q12H  . torsemide  20 mg Oral Daily   sodium chloride, acetaminophen, HYDROmorphone, nitroGLYCERIN, ondansetron (ZOFRAN) IV, oxyCODONE, sodium chloride flush  Assessment/ Plan:  [name] [name] is a 58 y.o. white male  withhypertension, diabetes mellitus type II, diabetic neuropathy, systolic congestive heart failure, peripheral vascular disease status post left  toe amputations, autonomic neuropathy, coronary artery disease status post CABG, history of osteomyelitis, who is admitted for DOE (dyspnea on exertion) [R06.00]   1. Acute kidney failure on chronic kidney disease stage IV with metabolic acidosis: baseline creatinine of 2.42, GFR of 29 on 02/23/19.  Required hemodialysis on last admission.  - sodium bicarbonate.   2. Acute exacerbation of chronic systolic congestive heart failure.  - PO torsemide - increase dose to 40mg  daily.  - fluid restriction, salt restriction  3. Hypertension:   - carvedilol, torsemide, isosorbide mononitrate, and hydralazine.   4. Anemia of chronic kidney disease: normocytic. Hemoglobin 7.7 - discussed role of ESA.   5. Ventricular Tachycardia: - appreciate cardiology input.       LOS: 3 [name] [name] 10/1/20209:36 AM

## 2019-08-18 ENCOUNTER — Telehealth: Payer: Self-pay | Admitting: Cardiovascular Disease

## 2019-08-18 ENCOUNTER — Other Ambulatory Visit: Payer: Self-pay

## 2019-08-18 DIAGNOSIS — I2510 Atherosclerotic heart disease of native coronary artery without angina pectoris: Secondary | ICD-10-CM

## 2019-08-18 DIAGNOSIS — I5023 Acute on chronic systolic (congestive) heart failure: Secondary | ICD-10-CM

## 2019-08-18 DIAGNOSIS — R0600 Dyspnea, unspecified: Secondary | ICD-10-CM

## 2019-08-18 LAB — BASIC METABOLIC PANEL
Anion gap: 9 (ref 5–15)
BUN: 54 mg/dL — ABNORMAL HIGH (ref 6–20)
CO2: 27 mmol/L (ref 22–32)
Calcium: 8.5 mg/dL — ABNORMAL LOW (ref 8.9–10.3)
Chloride: 107 mmol/L (ref 98–111)
Creatinine, Ser: 3.25 mg/dL — ABNORMAL HIGH (ref 0.61–1.24)
GFR calc Af Amer: 23 mL/min — ABNORMAL LOW (ref >60)
GFR calc non Af Amer: 20 mL/min — ABNORMAL LOW (ref >60)
Glucose, Bld: 173 mg/dL — ABNORMAL HIGH (ref 70–99)
Potassium: 3.6 mmol/L (ref 3.5–5.1)
Sodium: 143 mmol/L (ref 135–145)

## 2019-08-18 LAB — GLUCOSE, CAPILLARY
Glucose-Capillary: 174 mg/dL — ABNORMAL HIGH (ref 70–99)
Glucose-Capillary: 219 mg/dL — ABNORMAL HIGH (ref 70–99)

## 2019-08-18 MED ORDER — ISOSORBIDE MONONITRATE ER 30 MG PO TB24: 30.0000 mg | ORAL_TABLET | Freq: Two times a day (BID) | ORAL | 0 refills | Status: DC

## 2019-08-18 MED ORDER — HYDRALAZINE HCL 50 MG PO TABS: 50.0000 mg | ORAL_TABLET | Freq: Three times a day (TID) | ORAL | 0 refills | Status: DC

## 2019-08-18 NOTE — Plan of Care (Signed)
Awaiting LifeVest vendor to fit pt prior to discharge home.  Problem: Health Behavior/Discharge Planning: Goal: Ability to manage health-related needs will improve Outcome: Progressing   Problem: Clinical Measurements: Goal: Ability to maintain clinical measurements within normal limits will improve Outcome: Progressing Goal: Will remain free from infection Outcome: Progressing Goal: Diagnostic test results will improve Outcome: Progressing Goal: Respiratory complications will improve Outcome: Progressing Goal: Cardiovascular complication will be avoided Outcome: Progressing   Problem: Activity: Goal: Risk for activity intolerance will decrease Outcome: Progressing   Problem: Nutrition: Goal: Adequate nutrition will be maintained Outcome: Progressing   Problem: Coping: Goal: Level of anxiety will decrease Outcome: Progressing   Problem: Elimination: Goal: Will not experience complications related to bowel motility Outcome: Progressing Goal: Will not experience complications related to urinary retention Outcome: Progressing   Problem: Pain Managment: Goal: General experience of comfort will improve Outcome: Progressing   Problem: Safety: Goal: Ability to remain free from injury will improve Outcome: Progressing   Problem: Skin Integrity: Goal: Risk for impaired skin integrity will decrease Outcome: Progressing   Problem: Education: Goal: Ability to demonstrate management of disease process will improve Outcome: Progressing Goal: Ability to verbalize understanding of medication therapies will improve Outcome: Progressing Goal: Individualized Educational Video(s) Outcome: Progressing   Problem: Activity: Goal: Capacity to carry out activities will improve Outcome: Progressing   Problem: Cardiac: Goal: Ability to achieve and maintain adequate cardiopulmonary perfusion will improve Outcome: Progressing

## 2019-08-18 NOTE — Telephone Encounter (Signed)
Virtual Visit Pre-Appointment Phone Call  "(Name), I am calling you today to discuss your upcoming appointment. We are currently trying to limit exposure to the virus that causes COVID-19 by seeing patients at home rather than in the office."  1. "What is the BEST phone number to call the day of the visit?" - include this in appointment notes  2. Do you have or have access to (through a family member/friend) a smartphone with video capability that we can use for your visit?" a. If yes - list this number in appt notes as cell (if different from BEST phone #) and list the appointment type as a VIDEO visit in appointment notes b. If no - list the appointment type as a PHONE visit in appointment notes  3. Confirm consent - "In the setting of the current Covid19 crisis, you are scheduled for a (phone or video) visit with your provider on (date) at (time).  Just as we do with many in-office visits, in order for you to participate in this visit, we must obtain consent.  If you'd like, I can send this to your mychart (if signed up) or email for you to review.  Otherwise, I can obtain your verbal consent now.  All virtual visits are billed to your insurance company just like a normal visit would be.  By agreeing to a virtual visit, we'd like you to understand that the technology does not allow for your provider to perform an examination, and thus may limit your provider's ability to fully assess your condition. If your provider identifies any concerns that need to be evaluated in person, we will make arrangements to do so.  Finally, though the technology is pretty good, we cannot assure that it will always work on either your or our end, and in the setting of a video visit, we may have to convert it to a phone-only visit.  In either situation, we cannot ensure that we have a secure connection.  Are you willing to proceed?" STAFF: Did the patient verbally acknowledge consent to telehealth visit? Document  YES/NO here: YES  4. Advise patient to be prepared - "Two hours prior to your appointment, go ahead and check your blood pressure, pulse, oxygen saturation, and your weight (if you have the equipment to check those) and write them all down. When your visit starts, your provider will ask you for this information. If you have an Apple Watch or Kardia device, please plan to have heart rate information ready on the day of your appointment. Please have a pen and paper handy nearby the day of the visit as well."  5. Give patient instructions for MyChart download to smartphone OR [email or web address] as below if video visit (depending on what platform provider is using)  6. Inform patient they will receive a phone call 15 minutes prior to their appointment time (may be from unknown caller ID) so they should be prepared to answer    TELEPHONE CALL NOTE  [name] [name] has been deemed a candidate for a follow-up tele-health visit to limit community exposure during the Covid-19 pandemic. I spoke with the patient via phone to ensure availability of phone/video source, confirm preferred email & phone number, and discuss instructions and expectations.  I reminded [name] [name] to be prepared with any vital sign and/or heart rhythm information that could potentially be obtained via home monitoring, at the time of his visit. I reminded [name] [name] to expect a phone call prior to  his visit.  Ace Gins 08/18/2019 3:49 PM   INSTRUCTIONS FOR DOWNLOADING THE MYCHART APP TO SMARTPHONE  - The patient must first make sure to have activated MyChart and know their login information - If Apple, go to [employer] and type in MyChart in the search bar and download the app. If Android, ask patient to go to [employer] and type in Ronda in the search bar and download the app. The app is free but as with any other app downloads, their phone may require them to verify saved payment information or Apple/Android  password.  - The patient will need to then log into the app with their MyChart username and password, and select [facility] as their healthcare provider to link the account. When it is time for your visit, go to the MyChart app, find appointments, and click Begin Video Visit. Be sure to Select Allow for your device to access the Microphone and Camera for your visit. You will then be connected, and your provider will be with you shortly.  **If they have any issues connecting, or need assistance please contact MyChart service desk [phone])**  **If using a computer, in order to ensure the best quality for their visit they will need to use either of the following Internet Browsers: [employer], or Google Chrome**  IF USING DOXIMITY or DOXY.ME - The patient will receive a link just prior to their visit by text.     FULL LENGTH CONSENT FOR TELE-HEALTH VISIT   I hereby voluntarily request, consent and authorize [facility] and its employed or contracted physicians, physician assistants, nurse practitioners or other licensed health care professionals (the Practitioner), to provide me with telemedicine health care services (the Services") as deemed necessary by the treating Practitioner. I acknowledge and consent to receive the Services by the Practitioner via telemedicine. I understand that the telemedicine visit will involve communicating with the Practitioner through live audiovisual communication technology and the disclosure of certain medical information by electronic transmission. I acknowledge that I have been given the opportunity to request an in-person assessment or other available alternative prior to the telemedicine visit and am voluntarily participating in the telemedicine visit.  I understand that I have the right to withhold or withdraw my consent to the use of telemedicine in the course of my care at any time, without affecting my right to future care or treatment,  and that the Practitioner or I may terminate the telemedicine visit at any time. I understand that I have the right to inspect all information obtained and/or recorded in the course of the telemedicine visit and may receive copies of available information for a reasonable fee.  I understand that some of the potential risks of receiving the Services via telemedicine include:   Delay or interruption in medical evaluation due to technological equipment failure or disruption;  Information transmitted may not be sufficient (e.g. poor resolution of images) to allow for appropriate medical decision making by the Practitioner; and/or   In rare instances, security protocols could fail, causing a breach of personal health information.  Furthermore, I acknowledge that it is my responsibility to provide information about my medical history, conditions and care that is complete and accurate to the best of my ability. I acknowledge that Practitioner's advice, recommendations, and/or decision may be based on factors not within their control, such as incomplete or inaccurate data provided by me or distortions of diagnostic images or specimens that may result from electronic transmissions. I understand  that the practice of medicine is not an exact science and that Practitioner makes no warranties or guarantees regarding treatment outcomes. I acknowledge that I will receive a copy of this consent concurrently upon execution via email to the email address I last provided but may also request a printed copy by calling the office of [facility].    I understand that my insurance will be billed for this visit.   I have read or had this consent read to me.  I understand the contents of this consent, which adequately explains the benefits and risks of the Services being provided via telemedicine.   I have been provided ample opportunity to ask questions regarding this consent and the Services and have had my questions  answered to my satisfaction.  I give my informed consent for the services to be provided through the use of telemedicine in my medical care  By participating in this telemedicine visit I agree to the above.

## 2019-08-18 NOTE — Progress Notes (Signed)
[facility]  ROUNDING NOTE   Subjective:   Plan on life vest for today.   Weaned off oxygen completely.   Weight down to 113.6 kg  Objective:  Vital signs in last 24 hours:  Temp:  [97.9 F (36.6 C)-98.6 F (37 C)] 97.9 F (36.6 C) (10/02 0800) Pulse Rate:  [78-80] 80 (10/02 0800) Resp:  [18-19] 19 (10/02 0800) BP: (142-163)/(71-77) 150/71 (10/02 0800) SpO2:  [95 %-100 %] 97 % (10/02 0800) Weight:  [113.6 kg] 113.6 kg (10/02 0510)  Weight change: -1.089 kg Filed Weights   08/16/19 0325 08/17/19 0319 08/18/19 0510  Weight: 116.8 kg 114.7 kg 113.6 kg    Intake/Output: I/O last 3 completed shifts: In: 120 [P.O.:120] Out: 2300 [Urine:2300]   Intake/Output this shift:  Total I/O In: -  Out: 750 [Urine:750]  Physical Exam: General: NAD,   Head: Normocephalic, atraumatic. Moist oral mucosal membranes  Eyes: Anicteric, PERRL  Neck: Supple, trachea midline  Lungs:  Clear to auscultation  Heart: Regular rate and rhythm  Abdomen:  Soft, nontender,   Extremities:  no peripheral edema.  Neurologic: Nonfocal, moving all four extremities  Skin: No lesions       Basic Metabolic Panel: Recent Labs  Lab 08/14/19 1428 08/15/19 0451 08/16/19 0543 08/17/19 0524 08/18/19 0527  NA 138 140 142 142 143  K 4.1 3.8 3.9 3.5 3.6  CL 100 103 102 105 107  CO2 26 27 27 26 27   GLUCOSE 270* 240* 211* 200* 173*  BUN 64* 62* 59* 58* 54*  CREATININE 3.65* 3.43* 3.45* 3.33* 3.25*  CALCIUM 8.4* 8.1* 8.5* 8.4* 8.5*  MG 2.2  --   --   --   --   PHOS 4.3  --   --   --   --     Liver Function Tests: No results for input(s): AST, ALT, ALKPHOS, BILITOT, PROT, ALBUMIN in the last 168 hours. No results for input(s): LIPASE, AMYLASE in the last 168 hours. No results for input(s): AMMONIA in the last 168 hours.  CBC: Recent Labs  Lab 08/14/19 1428 08/16/19 0543  WBC 9.1 8.4  HGB 7.8* 7.7*  HCT 25.0* 25.1*  MCV 94.3 94.7  PLT 337 288    Cardiac Enzymes: No  results for input(s): CKTOTAL, CKMB, CKMBINDEX, TROPONINI in the last 168 hours.  BNP: Invalid input(s): POCBNP  CBG: Recent Labs  Lab 08/17/19 0753 08/17/19 1141 08/17/19 1631 08/17/19 2121 08/18/19 0801  GLUCAP 187* 230* 215* 163* 174*    Microbiology: Results for orders placed or performed during the hospital encounter of 08/14/19  SARS CORONAVIRUS 2 (TAT 6-24 HRS) Nasopharyngeal Nasopharyngeal Swab     Status: None   Collection Time: 08/14/19  9:05 PM   Specimen: Nasopharyngeal Swab  Result Value Ref Range Status   SARS Coronavirus 2 NEGATIVE NEGATIVE Final    Comment: (NOTE) SARS-CoV-2 target nucleic acids are NOT DETECTED. The SARS-CoV-2 RNA is generally detectable in upper and lower respiratory specimens during the acute phase of infection. Negative results do not preclude SARS-CoV-2 infection, do not rule out co-infections with other pathogens, and should not be used as the sole basis for treatment or other patient management decisions. Negative results must be combined with clinical observations, patient history, and epidemiological information. The expected result is Negative. Fact Sheet for Patients: SugarRoll.be Fact Sheet for Healthcare Providers: https://www.woods-mathews.com/ This test is not yet approved or cleared by the [place] FDA and  has been authorized for detection and/or diagnosis of  SARS-CoV-2 by FDA under an Emergency Use Authorization (EUA). This EUA will remain  in effect (meaning this test can be used) for the duration of the COVID-19 declaration under Section 56 4(b)(1) of the Act, 21 U.S.C. section 360bbb-3(b)(1), unless the authorization is terminated or revoked sooner. Performed at [facility], [place] [address], [place], [place] [address]     Coagulation Studies: No results for input(s): LABPROT, INR in the last 72 hours.  Urinalysis: No results for input(s): COLORURINE, LABSPEC,  PHURINE, GLUCOSEU, HGBUR, BILIRUBINUR, KETONESUR, PROTEINUR, UROBILINOGEN, NITRITE, LEUKOCYTESUR in the last 72 hours.  Invalid input(s): APPERANCEUR    Imaging: No results found.   Medications:   . sodium chloride     . aspirin EC  81 mg Oral Daily  . atorvastatin  40 mg Oral QHS  . carvedilol  25 mg Oral BID WC  . famotidine  20 mg Oral QHS  . ferrous sulfate  325 mg Oral Daily  . heparin  5,000 Units Subcutaneous Q8H  . hydrALAZINE  50 mg Oral TID  . insulin aspart  0-9 Units Subcutaneous TID WC  . insulin detemir  15 Units Subcutaneous Daily  . isosorbide mononitrate  30 mg Oral BID  . multivitamin  1 tablet Oral QHS  . potassium chloride  20 mEq Oral Daily  . sodium bicarbonate  1,300 mg Oral BID  . sodium chloride flush  3 mL Intravenous Q12H  . torsemide  40 mg Oral Daily   sodium chloride, acetaminophen, HYDROmorphone, nitroGLYCERIN, ondansetron (ZOFRAN) IV, oxyCODONE, sodium chloride flush  Assessment/ Plan:  [name] is a 58 y.o. white male  withhypertension, diabetes mellitus type II, diabetic neuropathy, systolic congestive heart failure, peripheral vascular disease status post left toe amputations, autonomic neuropathy, coronary artery disease status post CABG, history of osteomyelitis, who is admitted for DOE (dyspnea on exertion) [R06.00]   1. Acute kidney failure on chronic kidney disease stage IV with metabolic acidosis: baseline creatinine of 2.42, GFR of 29 on 02/23/19.  Required hemodialysis on last admission.  - sodium bicarbonate.   2. Acute exacerbation of chronic systolic congestive heart failure.  - PO torsemide - increases dose to 40mg  daily this admission.  - fluid restriction, salt restriction  3. Hypertension:   - carvedilol, torsemide, isosorbide mononitrate, and hydralazine.   4. Anemia of chronic kidney disease: normocytic. Hemoglobin 7.7 - discussed role of ESA.   5. Ventricular Tachycardia: - appreciate cardiology input.  Life Vest for today.        LOS: 4 Idell Hissong 10/2/20209:44 AM

## 2019-08-18 NOTE — Progress Notes (Signed)
Progress Note  Patient Name: NILE PRISK Date of Encounter: 08/18/2019  Primary Cardiologist: Ida Rogue, MD , [facility]  Subjective   Reds vest score 36% today Weight is 250 pounds Feels good, no leg edema no shortness of breath on ambulation abdomen feels back to baseline He has old vest in place  Inpatient Medications    Scheduled Meds: . aspirin EC  81 mg Oral Daily  . atorvastatin  40 mg Oral QHS  . carvedilol  25 mg Oral BID WC  . famotidine  20 mg Oral QHS  . ferrous sulfate  325 mg Oral Daily  . heparin  5,000 Units Subcutaneous Q8H  . hydrALAZINE  50 mg Oral TID  . insulin aspart  0-9 Units Subcutaneous TID WC  . insulin detemir  15 Units Subcutaneous Daily  . isosorbide mononitrate  30 mg Oral BID  . multivitamin  1 tablet Oral QHS  . potassium chloride  20 mEq Oral Daily  . sodium bicarbonate  1,300 mg Oral BID  . sodium chloride flush  3 mL Intravenous Q12H  . torsemide  40 mg Oral Daily   Continuous Infusions: . sodium chloride     PRN Meds: sodium chloride, acetaminophen, HYDROmorphone, nitroGLYCERIN, ondansetron (ZOFRAN) IV, oxyCODONE, sodium chloride flush   Vital Signs    Vitals:   08/17/19 1612 08/17/19 1952 08/18/19 0510 08/18/19 0800  BP: (!) 142/75 (!) 157/77 (!) 163/76 (!) 150/71  Pulse: 79 79 78 80  Resp: 19 18 18 19   Temp: 98.6 F (37 C) 98.2 F (36.8 C) 98.1 F (36.7 C) 97.9 F (36.6 C)  TempSrc: Oral Oral Oral Oral  SpO2: 99% 95% 100% 97%  Weight:   113.6 kg   Height:        Intake/Output Summary (Last 24 hours) at 08/18/2019 1159 Last data filed at 08/18/2019 1017 Gross per 24 hour  Intake 120 ml  Output 2250 ml  Net -2130 ml   Last 3 Weights 08/18/2019 08/17/2019 08/16/2019  Weight (lbs) 250 lb 6.4 oz 252 lb 12.8 oz 257 lb 6.4 oz  Weight (kg) 113.581 kg 114.669 kg 116.756 kg      Telemetry    NSR, PVCs-Personally Reviewed  ECG     - Personally Reviewed  Physical Exam   Constitutional:  oriented to person,  place, and time. No distress.  HENT:  Head: Normocephalic and atraumatic.  Eyes:  no discharge. No scleral icterus.  Neck: Normal range of motion. Neck supple. No JVD present.  Cardiovascular: Normal rate, regular rhythm, normal heart sounds and intact distal pulses. Exam reveals no gallop and no friction rub. No edema No murmur heard. Pulmonary/Chest: Effort normal and breath sounds normal. No stridor. No respiratory distress.  no wheezes.  no rales.  no tenderness.  Abdominal: Soft.  no distension.  no tenderness.  Musculoskeletal: Normal range of motion.  no  tenderness or deformity.  Neurological:  normal muscle tone. Coordination normal. No atrophy Skin: Skin is warm and dry. No rash noted. not diaphoretic.  Psychiatric:  normal mood and affect. behavior is normal. Thought content normal.      Labs    High Sensitivity Troponin:   Recent Labs  Lab 07/27/19 1711 07/27/19 1909 07/28/19 0303 07/28/19 0547 08/14/19 1428  TROPONINIHS 32* 55* 35* 31* 28*      Chemistry Recent Labs  Lab 08/16/19 0543 08/17/19 0524 08/18/19 0527  NA 142 142 143  K 3.9 3.5 3.6  CL 102 105 107  CO2  27 26 27   GLUCOSE 211* 200* 173*  BUN 59* 58* 54*  CREATININE 3.45* 3.33* 3.25*  CALCIUM 8.5* 8.4* 8.5*  GFRNONAA 19* 20* 20*  GFRAA 22* 23* 23*  ANIONGAP 13 11 9      Hematology Recent Labs  Lab 08/14/19 1428 08/16/19 0543  WBC 9.1 8.4  RBC 2.65* 2.65*  HGB 7.8* 7.7*  HCT 25.0* 25.1*  MCV 94.3 94.7  MCH 29.4 29.1  MCHC 31.2 30.7  RDW 15.3 15.7*  PLT 337 288    BNP Recent Labs  Lab 08/14/19 1428  BNP 2,227.0*     DDimer No results for input(s): DDIMER in the last 168 hours.   Radiology    No results found.  Cardiac Studies     Patient Profile     TEJUAN GHOLSON is a 58 y.o. male with a hx of CAD s/p four-vessel CABG (04/2014), HFrEF (EF 30-35%, 06/2019) secondary to ICM, NSVT, paroxysmal SVT, PVD, poorly controlled insulin-dependent diabetes, diabetic foot ulcers  and diabetic peripheral neuropathyrequiring amputations of the left toes, osteomyelitis, CKD stage IV, hypertension, hyperlipidemia, asymptomatic orthostatic hypotension, medication compliance issues secondary to financial issues,andwho is being seen today per request of Rufina Falco, NP for the evaluation ofshortness of breathand recent episode of LOC while seated.  Assessment & Plan    1.  Syncope Hypoxia on arrival in the setting of CHF Also concern for sustained VT Dr. Saunders Revel evaluated telemetry showing nonsustained VT 19 beats EP unable to evaluate patient on an inpatient basis -LifeVest has been placed for discharge Ejection fraction 30 to 35% previous MI, known ischemic cardiomyopathy, nonsustained VT with syncope -We will help to arrange for EP outpatient follow-up  2. CAD stable angina No further ischemic work-up at this time Denies anginal symptoms  3.  Pulmonary edema History of medication noncompliance -Recommend torsemide 40 daily Goal weight 250 pounds or less 3 pound weight gain with take torsemide 40 twice daily Moderate salt and fluid intake -Close follow-up with EP, CHF clinic and cardiology clinic  4.  Essential hypertension Blood pressure running high this admission Medication changes made yesterday hydralazine up to 50 3 times daily, isosorbide 30 twice daily, continue carvedilol    Total encounter time more than 25 minutes  Greater than 50% was spent in counseling and coordination of care with the patient    For questions or updates, please contact [place] Please consult [email or web address] for contact info under        Signed, Ida Rogue, MD  08/18/2019, 11:59 AM

## 2019-08-18 NOTE — Discharge Summary (Signed)
[facility] NAME: [name] [name]    MR#:  [id number]  DATE OF BIRTH:  22-Apr-1961  DATE OF ADMISSION:  08/14/2019 ADMITTING PHYSICIAN: [name], NP  DATE OF DISCHARGE: 08/18/2019   PRIMARY CARE PHYSICIAN: Tracie Harrier, MD    ADMISSION DIAGNOSIS:  DOE (dyspnea on exertion) [R06.00]  DISCHARGE DIAGNOSIS:  Principal Problem:   Syncope and collapse Active Problems:   Diabetes mellitus type 2 with complications (HCC)   NSVT (nonsustained ventricular tachycardia) (HCC)   Acute on chronic systolic CHF (congestive heart failure) (HCC)   CAD (coronary artery disease), native coronary artery   SECONDARY DIAGNOSIS:   Past Medical History:  Diagnosis Date  . CAD (coronary artery disease)    a. 04/2014 CABG x 4 (LIMA->LAD, VG->Diag, VG->OM, VG->PDA).  . Chronic systolic CHF (congestive heart failure) ([place])    a. 07/2014 Echo: EF 30-35%.  . CKD (chronic kidney disease), stage III ([place])    a. 12/2015 Creat 1.8.  . Diabetic foot ulcers ([place])    a. left foot 2nd digit ant 5 th digit 05/03/14; b. 08/2014 s/p amputation of toes on L foot.  . Hypercholesterolemia    a. 12/2015 TC 116, TG 154, HDL 24, LDL 61-->Atorvastatin 40.  Marland Kitchen Hypertension   . Hypertensive heart disease   . Ischemic cardiomyopathy    a. 07/2013 Echo: EF 20%; b. 07/2014 Echo: EF 30-35%, diff HK, Gr1 DD, mildly dil LA, nl PASP.  Marland Kitchen Myocardial infarction ([place])   . Neuropathy in diabetes ([place])   . Open wound    foot  . Orthostatic hypotension   . Peripheral vascular disease ([place])   . Type II diabetes mellitus ([place])    a. 12/2015 HbA1c = 13.9.    HOSPITAL COURSE:   58 year old male with CAD status post four-vessel CABG, chronic systolic heart failure ejection fraction 30 to 35%, PSVT and poorly controlled diabetes who presents to the emergency room due to shortness of breath and syncope.  1.  Syncope with hypoxia: Concern may be for transient  arrhythmia with greatest concern for V. tach.  Patient had asymptomatic 19 beat nonsustained V. tach on telemetry - now in NSR  VQ scan negative for PE. Most recent echocardiogram showed ejection fraction 30 to 35% with no significant valvular disease. Appreciate Cardiology consult, plan for ICD as out pt,  LifeVest was provided before discharge.    2.  Acute on chronic systolic heart failure ejection fraction 30 to 35%: Patient has agreed on torsemide 20 mg daily. Patient currently euvolemic as per cardiology and nephrology. Renal function precludes using ACEI/ARB/Entressto   3.  Chronic kidney disease with baseline creatinine between 3-3.2 stage IV: Avoid nephrotoxic medications Appreciate nephrology consultation Will need to have outpatient follow-up with Dr. Candiss Norse at discharge. Continue sodium bicarb  4.  Status post four-vessel CABG: Patient has ruled out for ACS during this admission. Continue Coreg, hydralazine, Imdur, statin aspirin   5.  Essential hypertension: Continue hydralazine, Coreg, Imdur  6.  Diabetes: Continue Levemir with sliding scale  7.  Anemia of chronic disease: Transfuse if hemoglobin less than 7. Continue ferrous sulfate Discussed with cardiology and nephrology  -Patient had hypoxia on exertion Initially.  With continued diuresis and having less fluid in the lungs today he did not had hypoxia on walking.  So he does not require home oxygen now.  DISCHARGE CONDITIONS:   Stable.  CONSULTS OBTAINED:  Treatment Team:  Nelva Bush, MD [place],  Lurena Nida, MD  DRUG ALLERGIES:   Allergies  Allergen Reactions  . 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 Doesn't believe he has an allergy to PCN as he has taken it previously, according to wife.  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 Doesn't believe he  has an allergy to PCN as he has taken it previously, according to wife.    DISCHARGE MEDICATIONS:   Allergies as of 08/18/2019      Reactions   Unasyn [ampicillin-sulbactam Sodium] Anaphylaxis   Allergic interstitial nephritis to Unasyn/Zosyn in 2014. Doesn't believe he has an allergy to PCN as he has taken it previously, according to wife.   Zosyn [piperacillin Sod-tazobactam So] Anaphylaxis   Allergic interstitial nephritis to Unasyn/Zosyn in 2014. Doesn't believe he has an allergy to PCN as he has taken it previously, according to wife.      Medication List    TAKE these medications   aspirin EC 81 MG tablet Take 81 mg by mouth daily.   atorvastatin 40 MG tablet Commonly known as: LIPITOR Take 1 tablet (40 mg total) by mouth at bedtime.   carvedilol 25 MG tablet Commonly known as: COREG Take 1 tablet (25 mg total) by mouth 2 (two) times daily with a meal. What changed: when to take this   famotidine 20 MG tablet Commonly known as: PEPCID TAKE 1 TABLET BY MOUTH EVERY DAY What changed: when to take this   ferrous sulfate 325 (65 FE) MG tablet Take 325 mg by mouth daily.   hydrALAZINE 50 MG tablet Commonly known as: APRESOLINE Take 1 tablet (50 mg total) by mouth 3 (three) times daily. What changed:   medication strength  how much to take   HYDROmorphone 2 MG tablet Commonly known as: DILAUDID Take 1 mg by mouth every 12 (twelve) hours as needed for severe pain.   insulin aspart 100 UNIT/ML injection Commonly known as: novoLOG Inject 0-9 Units into the skin 3 (three) times daily with meals.   insulin detemir 100 UNIT/ML injection Commonly known as: LEVEMIR Inject 0.15 mLs (15 Units total) into the skin daily.   isosorbide mononitrate 30 MG 24 hr tablet Commonly known as: IMDUR Take 1 tablet (30 mg total) by mouth 2 (two) times daily. What changed:   medication strength  when to take this   multivitamin Tabs tablet Take 1 tablet by mouth at bedtime.    nitroGLYCERIN 0.4 MG SL tablet Commonly known as: Nitrostat Place 1 tablet (0.4 mg total) under the tongue every 5 (five) minutes as needed for chest pain.   Oxycodone HCl 10 MG Tabs Take 1 tablet (10 mg total) by mouth every 8 (eight) hours as needed for moderate pain or breakthrough pain.   sodium bicarbonate 650 MG tablet Take 1,300 mg by mouth 2 (two) times daily.   torsemide 20 MG tablet Commonly known as: DEMADEX Take 2 tablets (40 mg total) by mouth daily.   VITAMIN B 12 PO Take 1 tablet by mouth at bedtime.            Durable Medical Equipment  (From admission, onward)         Start     Ordered   08/17/19 1348  For home use only DME oxygen  Once    Comments: For CHF  Question Answer Comment  Length of Need Lifetime   Mode or (Route) Nasal cannula   Liters  per Minute 2   Frequency Continuous (stationary and portable oxygen unit needed)   Oxygen conserving device Yes   Oxygen delivery system Gas      08/17/19 1348           DISCHARGE INSTRUCTIONS:   Follow with Cardiology and nephrology clinic in 1-2 weeks.  If you experience worsening of your admission symptoms, develop shortness of breath, life threatening emergency, suicidal or homicidal thoughts you must seek medical attention immediately by calling 911 or calling your MD immediately  if symptoms less severe.  You Must read complete instructions/literature along with all the possible adverse reactions/side effects for all the Medicines you take and that have been prescribed to you. Take any new Medicines after you have completely understood and accept all the possible adverse reactions/side effects.   Please note  You were cared for by a hospitalist during your hospital stay. If you have any questions about your discharge medications or the care you received while you were in the hospital after you are discharged, you can call the unit and asked to speak with the hospitalist on call if the  hospitalist that took care of you is not available. Once you are discharged, your primary care physician will handle any further medical issues. Please note that NO REFILLS for any discharge medications will be authorized once you are discharged, as it is imperative that you return to your primary care physician (or establish a relationship with a primary care physician if you do not have one) for your aftercare needs so that they can reassess your need for medications and monitor your lab values.    Today   CHIEF COMPLAINT:   Chief Complaint  Patient presents with  . Hyperglycemia  . Hypertension  . Near Syncope    HISTORY OF PRESENT ILLNESS:  [name] [name]  is a 58 y.o. male with a known history of CAD s/p CABG, HFr EF due to ICM, PVD, uncontrolled diabetes mellitus, CKD, hypertension, hyperlipidemia, and anemia of chronic disease the ED with chief complaints of near syncopal episode, significant dyspnea on exertion and generalized weakness.  The patient's wife is currently at the bedside, patient has been unable to ambulate without becoming short of breath.  Should wife state he walked between rooms and became so short of breath he sat down and almost lost consciousness.  No reports of seizure or seizure-like activity.  Wife noted that his oxygen was on the low 80s on room air.  He does not normally wear oxygen at home.  No reports of palpitation, fevers or chills, nausea, vomiting, diarrhea, or recent sick contacts.  Patient was recently discharged following admission for acute on chronic systolic CHF.  On arrival to the ED, he was afebrile with blood pressure 224/114 mm Hg and pulse rate 81 beats/min. There were no focal neurological deficits; he was alert and oriented x4, and he did not demonstrate any memory deficits.  Initial labs revealed glucose of 258, BUN 64, creatinine 3.65, hemoglobin 7.8, BNP 2227, COVID-19 negative.  Urinalysis negative for UTI.  X-ray showed mild cardiomegaly  no evidence of edema or consolidation.  Hospitalists were asked to admit for further management.   VITAL SIGNS:  Blood pressure (!) 150/71, pulse 80, temperature 97.9 F (36.6 C), temperature source Oral, resp. rate 19, height 6\' 1"  (1.854 m), weight 113.6 kg, SpO2 97 %.  I/O:    Intake/Output Summary (Last 24 hours) at 08/18/2019 1318 Last data filed at 08/18/2019 1017 Gross per 24  hour  Intake 0 ml  Output 1450 ml  Net -1450 ml    PHYSICAL EXAMINATION:   Constitutional: Appears well-developed and well-nourished. No distress. HENT: Normocephalic. Marland Kitchen Oropharynx is clear and moist.  Eyes: Conjunctivae and EOM are normal. PERRLA, no scleral icterus.  Neck: Normal ROM. Neck supple. No JVD. No tracheal deviation. CVS: RRR, S1/S2 +, no murmurs, no gallops, no carotid bruit.  Pulmonary: Effort and breath sounds normal, no stridor, rhonchi, wheezes, rales.  Abdominal: Soft. BS +,  no distension, tenderness, rebound or guarding.  Patient and wife still feels his abdomen is little bit swollen than his baseline. Musculoskeletal: Normal range of motion. 1+ LEE and no tenderness.  Neuro: Alert. CN 2-12 grossly intact. No focal deficits. Skin: Skin is warm and dry. No rash noted. Psychiatric: Normal mood and affect.   DATA REVIEW:   CBC Recent Labs  Lab 08/16/19 0543  WBC 8.4  HGB 7.7*  HCT 25.1*  PLT 288    Chemistries  Recent Labs  Lab 08/14/19 1428  08/18/19 0527  NA 138   < > 143  K 4.1   < > 3.6  CL 100   < > 107  CO2 26   < > 27  GLUCOSE 270*   < > 173*  BUN 64*   < > 54*  CREATININE 3.65*   < > 3.25*  CALCIUM 8.4*   < > 8.5*  MG 2.2  --   --    < > = values in this interval not displayed.    Cardiac Enzymes No results for input(s): TROPONINI in the last 168 hours.  Microbiology Results  Results for orders placed or performed during the hospital encounter of 08/14/19  SARS CORONAVIRUS 2 (TAT 6-24 HRS) Nasopharyngeal Nasopharyngeal Swab     Status: None    Collection Time: 08/14/19  9:05 PM   Specimen: Nasopharyngeal Swab  Result Value Ref Range Status   SARS Coronavirus 2 NEGATIVE NEGATIVE Final    Comment: (NOTE) SARS-CoV-2 target nucleic acids are NOT DETECTED. The SARS-CoV-2 RNA is generally detectable in upper and lower respiratory specimens during the acute phase of infection. Negative results do not preclude SARS-CoV-2 infection, do not rule out co-infections with other pathogens, and should not be used as the sole basis for treatment or other patient management decisions. Negative results must be combined with clinical observations, patient history, and epidemiological information. The expected result is Negative. Fact Sheet for Patients: SugarRoll.be Fact Sheet for Healthcare Providers: https://www.woods-mathews.com/ This test is not yet approved or cleared by the [place] FDA and  has been authorized for detection and/or diagnosis of SARS-CoV-2 by FDA under an Emergency Use Authorization (EUA). This EUA will remain  in effect (meaning this test can be used) for the duration of the COVID-19 declaration under Section 56 4(b)(1) of the Act, 21 U.S.C. section 360bbb-3(b)(1), unless the authorization is terminated or revoked sooner. Performed at [facility], [place] [address], [place], [place] [address]     RADIOLOGY:  No results found.  EKG:   Orders placed or performed during the hospital encounter of 08/14/19  . ED EKG  . ED EKG  . EKG 12-Lead  . EKG 12-Lead      Management plans discussed with the patient, family and they are in agreement.  CODE STATUS:     Code Status Orders  (From admission, onward)         Start     Ordered   08/14/19  2314  Full code  Continuous     08/14/19 2321        Code Status History    Date Active Date Inactive Code Status Order ID Comments User Context   07/28/2019 0239 08/08/2019 1935 Full Code [id number]  Harrie Foreman, MD Inpatient   07/10/2019 1636 07/13/2019 1733 Full Code [id number]  [name], NP ED   10/04/2018 2014 10/07/2018 2157 Full Code [id number]  Vaughan Basta, MD Inpatient   05/15/2014 1624 05/25/2014 1541 Full Code [id number]  Flora Lipps Inpatient   05/07/2014 1340 05/12/2014 0713 Full Code [id number]  Nani Skillern, PA-C Inpatient   Advance Care Planning Activity      TOTAL TIME TAKING CARE OF THIS PATIENT: 35 minutes.    Vaughan Basta M.D on 08/18/2019 at 1:18 PM  Between 7am to 6pm - Pager - [phone]  After 6pm go to [email or web address] - password [email or web address] [facility]  Office  [phone]  CC: Primary care physician; Tracie Harrier, MD   Note: This dictation was prepared with Dragon dictation along with smaller phrase technology. Any transcriptional errors that result from this process are unintentional.

## 2019-08-18 NOTE — TOC Transition Note (Signed)
Transition of Care [facility]) - CM/SW Discharge Note   Patient Details  Name: [name] [name] MRN: [id number] Date of Birth: [birth year]  [facility] CM/SW Contact:  Ross Ludwig, LCSW Phone Number: 08/18/2019, 1:30 PM   Clinical Narrative:     Patient was fitted for his [employer] life vest today, and he did not express any other questions.  Patient expressed he did not need any other equipment and did not feel like needed home health.   Final next level of care: Home/Self Care Barriers to Discharge: Barriers Resolved   Patient Goals and CMS Choice Patient states their goals for this hospitalization and ongoing recovery are:: To get his life vest and return back home. CMS Medicare.gov Compare Post Acute Care list provided to:: Patient Choice offered to / list presented to : Patient  Discharge Placement  Patient plans to discharge back home with [employer] life vest, no other needs expressed.                  Patient and family notified of of transfer: 08/18/19  Discharge Plan and Services                DME Arranged: Life vest DME Agency: [employer] Date DME Agency Contacted: 08/17/19 Time DME Agency Contacted: 1500 Representative spoke with at DME Agency: Tammy HH Arranged: NA [place] Agency: NA        Social Determinants of Health ([place]) Interventions     Readmission Risk Interventions Readmission Risk Prevention Plan 08/01/2019  Transportation Screening Complete  PCP or Specialist Appt within 3-5 Days Complete  HRI or [place] Complete  Social Work Consult for [place] Planning/Counseling Complete  Palliative Care Screening Not Applicable  Medication Review [employer]) Complete  Some recent data might be hidden

## 2019-08-18 NOTE — Consult Note (Signed)
   [facility] CM Inpatient Consult   08/18/2019  REG BIRCHER 01-10-1961 [id number]   Patient chart has been reviewed for readmissions less than 30 days and for high risk score, 29%, for unplanned readmissions.  Patient assessed for community Dent Management follow up needs.  Spoke with Mr. Cupples by telephone. HIPAA verified. Explained [facility] Care Management services as chronic disease management program. Patient consents to receiving follow up call when transitions home. States that he has difficulty (sometimes) affording long acting insulin medication and welcomes discussion of any options for assistance.  Mr. Barocio gives verbal consent to speak with his wife concerning community needs. [facility] CM referral placed for community follow up.  Of note, [facility] Care Management services does not replace or interfere with any services that are arranged by inpatient case management or social work.  Netta Cedars, MSN, [facility] Liaison Nurse Mobile Phone [phone]  Toll free office [phone]

## 2019-08-18 NOTE — Plan of Care (Signed)
Discharge instructions provided to pt.  All questions addressed.  Understanding verified through teach back.  Awaiting transportation home via POV.   Problem: Health Behavior/Discharge Planning: Goal: Ability to manage health-related needs will improve 08/18/2019 1329 by Aubery Lapping, RN Outcome: Completed/Met 08/18/2019 1102 by Aubery Lapping, RN Outcome: Progressing   Problem: Clinical Measurements: Goal: Ability to maintain clinical measurements within normal limits will improve 08/18/2019 1329 by Aubery Lapping, RN Outcome: Completed/Met 08/18/2019 1102 by Aubery Lapping, RN Outcome: Progressing Goal: Will remain free from infection 08/18/2019 1329 by Aubery Lapping, RN Outcome: Completed/Met 08/18/2019 1102 by Aubery Lapping, RN Outcome: Progressing Goal: Diagnostic test results will improve 08/18/2019 1329 by Aubery Lapping, RN Outcome: Completed/Met 08/18/2019 1102 by Aubery Lapping, RN Outcome: Progressing Goal: Respiratory complications will improve 08/18/2019 1329 by Aubery Lapping, RN Outcome: Completed/Met 08/18/2019 1102 by Aubery Lapping, RN Outcome: Progressing Goal: Cardiovascular complication will be avoided 08/18/2019 1329 by Aubery Lapping, RN Outcome: Completed/Met 08/18/2019 1102 by Aubery Lapping, RN Outcome: Progressing   Problem: Activity: Goal: Risk for activity intolerance will decrease 08/18/2019 1329 by Aubery Lapping, RN Outcome: Completed/Met 08/18/2019 1102 by Aubery Lapping, RN Outcome: Progressing   Problem: Nutrition: Goal: Adequate nutrition will be maintained 08/18/2019 1329 by Aubery Lapping, RN Outcome: Completed/Met 08/18/2019 1102 by Aubery Lapping, RN Outcome: Progressing   Problem: Coping: Goal: Level of anxiety will decrease 08/18/2019 1329 by Aubery Lapping, RN Outcome: Completed/Met 08/18/2019 1102 by Aubery Lapping, RN Outcome: Progressing    Problem: Elimination: Goal: Will not experience complications related to bowel motility 08/18/2019 1329 by Aubery Lapping, RN Outcome: Completed/Met 08/18/2019 1102 by Aubery Lapping, RN Outcome: Progressing Goal: Will not experience complications related to urinary retention 08/18/2019 1329 by Aubery Lapping, RN Outcome: Completed/Met 08/18/2019 1102 by Aubery Lapping, RN Outcome: Progressing   Problem: Pain Managment: Goal: General experience of comfort will improve 08/18/2019 1329 by Aubery Lapping, RN Outcome: Completed/Met 08/18/2019 1102 by Aubery Lapping, RN Outcome: Progressing   Problem: Safety: Goal: Ability to remain free from injury will improve 08/18/2019 1329 by Aubery Lapping, RN Outcome: Completed/Met 08/18/2019 1102 by Aubery Lapping, RN Outcome: Progressing   Problem: Skin Integrity: Goal: Risk for impaired skin integrity will decrease 08/18/2019 1329 by Aubery Lapping, RN Outcome: Completed/Met 08/18/2019 1102 by Aubery Lapping, RN Outcome: Progressing   Problem: Education: Goal: Ability to demonstrate management of disease process will improve 08/18/2019 1329 by Aubery Lapping, RN Outcome: Completed/Met 08/18/2019 1102 by Aubery Lapping, RN Outcome: Progressing Goal: Ability to verbalize understanding of medication therapies will improve 08/18/2019 1329 by Aubery Lapping, RN Outcome: Completed/Met 08/18/2019 1102 by Aubery Lapping, RN Outcome: Progressing Goal: Individualized Educational Video(s) 08/18/2019 1329 by Aubery Lapping, RN Outcome: Completed/Met 08/18/2019 1102 by Aubery Lapping, RN Outcome: Progressing   Problem: Activity: Goal: Capacity to carry out activities will improve 08/18/2019 1329 by Aubery Lapping, RN Outcome: Completed/Met 08/18/2019 1102 by Aubery Lapping, RN Outcome: Progressing   Problem: Cardiac: Goal: Ability to achieve and maintain  adequate cardiopulmonary perfusion will improve 08/18/2019 1329 by Aubery Lapping, RN Outcome: Completed/Met 08/18/2019 1102 by Aubery Lapping, RN Outcome: Progressing

## 2019-08-21 ENCOUNTER — Inpatient Hospital Stay: Payer: Medicare HMO | Attending: Oncology | Admitting: Oncology

## 2019-08-21 ENCOUNTER — Other Ambulatory Visit: Payer: Self-pay

## 2019-08-21 ENCOUNTER — Encounter: Payer: Self-pay | Admitting: Oncology

## 2019-08-21 ENCOUNTER — Inpatient Hospital Stay: Payer: Medicare HMO

## 2019-08-21 VITALS — BP 133/71 | HR 79 | Temp 97.8°F | Ht 73.0 in | Wt 257.0 lb

## 2019-08-21 DIAGNOSIS — D638 Anemia in other chronic diseases classified elsewhere: Secondary | ICD-10-CM

## 2019-08-21 DIAGNOSIS — I2510 Atherosclerotic heart disease of native coronary artery without angina pectoris: Secondary | ICD-10-CM | POA: Diagnosis not present

## 2019-08-21 DIAGNOSIS — I252 Old myocardial infarction: Secondary | ICD-10-CM | POA: Insufficient documentation

## 2019-08-21 DIAGNOSIS — E119 Type 2 diabetes mellitus without complications: Secondary | ICD-10-CM | POA: Insufficient documentation

## 2019-08-21 DIAGNOSIS — N184 Chronic kidney disease, stage 4 (severe): Secondary | ICD-10-CM

## 2019-08-21 DIAGNOSIS — E1122 Type 2 diabetes mellitus with diabetic chronic kidney disease: Secondary | ICD-10-CM | POA: Diagnosis not present

## 2019-08-21 DIAGNOSIS — D631 Anemia in chronic kidney disease: Secondary | ICD-10-CM | POA: Diagnosis not present

## 2019-08-21 DIAGNOSIS — Z992 Dependence on renal dialysis: Secondary | ICD-10-CM | POA: Diagnosis not present

## 2019-08-21 DIAGNOSIS — Z7982 Long term (current) use of aspirin: Secondary | ICD-10-CM | POA: Diagnosis not present

## 2019-08-21 DIAGNOSIS — Z79899 Other long term (current) drug therapy: Secondary | ICD-10-CM | POA: Insufficient documentation

## 2019-08-21 DIAGNOSIS — E785 Hyperlipidemia, unspecified: Secondary | ICD-10-CM

## 2019-08-21 DIAGNOSIS — E7800 Pure hypercholesterolemia, unspecified: Secondary | ICD-10-CM | POA: Diagnosis not present

## 2019-08-21 DIAGNOSIS — I130 Hypertensive heart and chronic kidney disease with heart failure and stage 1 through stage 4 chronic kidney disease, or unspecified chronic kidney disease: Secondary | ICD-10-CM | POA: Diagnosis not present

## 2019-08-21 DIAGNOSIS — Z794 Long term (current) use of insulin: Secondary | ICD-10-CM | POA: Insufficient documentation

## 2019-08-21 DIAGNOSIS — E1140 Type 2 diabetes mellitus with diabetic neuropathy, unspecified: Secondary | ICD-10-CM | POA: Diagnosis not present

## 2019-08-21 DIAGNOSIS — I10 Essential (primary) hypertension: Secondary | ICD-10-CM | POA: Diagnosis not present

## 2019-08-21 DIAGNOSIS — E1151 Type 2 diabetes mellitus with diabetic peripheral angiopathy without gangrene: Secondary | ICD-10-CM | POA: Diagnosis not present

## 2019-08-21 DIAGNOSIS — I255 Ischemic cardiomyopathy: Secondary | ICD-10-CM | POA: Diagnosis not present

## 2019-08-21 DIAGNOSIS — I5023 Acute on chronic systolic (congestive) heart failure: Secondary | ICD-10-CM | POA: Diagnosis not present

## 2019-08-21 LAB — COMPREHENSIVE METABOLIC PANEL
ALT: 24 U/L (ref 0–44)
AST: 25 U/L (ref 15–41)
Albumin: 3 g/dL — ABNORMAL LOW (ref 3.5–5.0)
Alkaline Phosphatase: 55 U/L (ref 38–126)
Anion gap: 12 (ref 5–15)
BUN: 61 mg/dL — ABNORMAL HIGH (ref 6–20)
CO2: 27 mmol/L (ref 22–32)
Calcium: 8 mg/dL — ABNORMAL LOW (ref 8.9–10.3)
Chloride: 101 mmol/L (ref 98–111)
Creatinine, Ser: 4.29 mg/dL — ABNORMAL HIGH (ref 0.61–1.24)
GFR calc Af Amer: 17 mL/min — ABNORMAL LOW (ref >60)
GFR calc non Af Amer: 14 mL/min — ABNORMAL LOW (ref >60)
Glucose, Bld: 283 mg/dL — ABNORMAL HIGH (ref 70–99)
Potassium: 4.4 mmol/L (ref 3.5–5.1)
Sodium: 140 mmol/L (ref 135–145)
Total Bilirubin: 0.7 mg/dL (ref 0.3–1.2)
Total Protein: 7.6 g/dL (ref 6.5–8.1)

## 2019-08-21 LAB — RETICULOCYTES
Immature Retic Fract: 10.3 % (ref 2.3–15.9)
RBC.: 2.86 MIL/uL — ABNORMAL LOW (ref 4.22–5.81)
Retic Count, Absolute: 138.7 10*3/uL (ref 19.0–186.0)
Retic Ct Pct: 4.9 % — ABNORMAL HIGH (ref 0.4–3.1)

## 2019-08-21 LAB — CBC WITH DIFFERENTIAL/PLATELET
Abs Immature Granulocytes: 0.02 10*3/uL (ref 0.00–0.07)
Basophils Absolute: 0.1 10*3/uL (ref 0.0–0.1)
Basophils Relative: 1 %
Eosinophils Absolute: 0.2 10*3/uL (ref 0.0–0.5)
Eosinophils Relative: 2 %
HCT: 27.7 % — ABNORMAL LOW (ref 39.0–52.0)
Hemoglobin: 8.7 g/dL — ABNORMAL LOW (ref 13.0–17.0)
Immature Granulocytes: 0 %
Lymphocytes Relative: 21 %
Lymphs Abs: 1.6 10*3/uL (ref 0.7–4.0)
MCH: 30.4 pg (ref 26.0–34.0)
MCHC: 31.4 g/dL (ref 30.0–36.0)
MCV: 96.9 fL (ref 80.0–100.0)
Monocytes Absolute: 0.8 10*3/uL (ref 0.1–1.0)
Monocytes Relative: 10 %
Neutro Abs: 5.2 10*3/uL (ref 1.7–7.7)
Neutrophils Relative %: 66 %
Platelets: 252 10*3/uL (ref 150–400)
RBC: 2.86 MIL/uL — ABNORMAL LOW (ref 4.22–5.81)
RDW: 16.9 % — ABNORMAL HIGH (ref 11.5–15.5)
WBC: 7.8 10*3/uL (ref 4.0–10.5)
nRBC: 0 % (ref 0.0–0.2)

## 2019-08-21 LAB — FERRITIN: Ferritin: 253 ng/mL (ref 24–336)

## 2019-08-21 LAB — FOLATE: Folate: 20.6 ng/mL (ref >5.9)

## 2019-08-21 LAB — TSH: TSH: 1.813 u[IU]/mL (ref 0.350–4.500)

## 2019-08-21 LAB — IRON AND TIBC
Iron: 68 ug/dL (ref 45–182)
Saturation Ratios: 30 % (ref 17.9–39.5)
TIBC: 231 ug/dL — ABNORMAL LOW (ref 250–450)
UIBC: 163 ug/dL

## 2019-08-21 LAB — VITAMIN B12: Vitamin B-12: 1681 pg/mL — ABNORMAL HIGH (ref 180–914)

## 2019-08-21 NOTE — Progress Notes (Signed)
Patient stated that he feels good. Patient had not noticed any blood loss.

## 2019-08-21 NOTE — Progress Notes (Signed)
Hematology/Oncology Consult note [facility] [phone] [phone]  Patient Care Team: Tracie Harrier, MD as PCP - General (Internal Medicine) Minna Merritts, MD as PCP - Cardiology (Cardiology) Deboraha Sprang, MD as PCP - Electrophysiology (Cardiology) Alisa Graff, FNP as Nurse Practitioner (Family Medicine)   Name of the patient: [name] [name]  [id number]  1961-09-23    Reason for referral-anemia   Referring physician-Dr. Bonna Gains  Date of visit: 08/21/19   History of presenting illness- Patient is a 58 year old male with a past medical history significant for type 2 diabetes, heart failure and CAD among other medical problems.  He was recently discharged from the hospital 2 days ago on 08/18/2019 after he presented with shortness of breath and syncope.  He has an EF of 30 to 35% and will need ICD as an outpatient.  He also has stage IV CKD and follows up with nephrology.  Patient has had chronic anemia dating back to 2015.  His most recent CBC from 08/16/2019 showed a white count of 8.4, H&H of 7.7/25.1 with a platelet count of 288.Anemia work-up showed ferritin of 179 a month ago.  Iron studies were consistent with anemia of chronic disease given that his TIBC was low at 163 and iron saturation low at 13%.  Currently patient feels well and his shortness of breath is resolved.  He is not on any oxygen  ECOG PS- 1  Pain scale- 0   Review of systems- Review of Systems  Constitutional: Positive for malaise/fatigue. Negative for chills, fever and weight loss.  HENT: Negative for congestion, ear discharge and nosebleeds.   Eyes: Negative for blurred vision.  Respiratory: Negative for cough, hemoptysis, sputum production, shortness of breath and wheezing.   Cardiovascular: Negative for chest pain, palpitations, orthopnea and claudication.  Gastrointestinal: Negative for abdominal pain, blood in stool, constipation, diarrhea,  heartburn, melena, nausea and vomiting.  Genitourinary: Negative for dysuria, flank pain, frequency, hematuria and urgency.  Musculoskeletal: Negative for back pain, joint pain and myalgias.  Skin: Negative for rash.  Neurological: Negative for dizziness, tingling, focal weakness, seizures, weakness and headaches.  Endo/Heme/Allergies: Does not bruise/bleed easily.  Psychiatric/Behavioral: Negative for depression and suicidal ideas. The patient does not have insomnia.     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 Doesn't believe he has an allergy to PCN as he has taken it previously, according to wife.   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 Doesn't believe he has an allergy to PCN as he has taken it previously, according to wife.    Patient Active Problem List   Diagnosis Date Noted   CAD (coronary artery disease), native coronary artery 08/18/2019   Syncope and collapse    Acute on chronic systolic CHF (congestive heart failure) ([place]) 08/14/2019   Acute midline low back pain without sciatica    Acute on chronic respiratory failure with hypoxemia ([place]) 07/28/2019   Acute renal failure (ARF) ([place]) 07/12/2019   Acute renal failure superimposed on stage 4 chronic kidney disease ([place]) 07/10/2019   NSVT (nonsustained ventricular tachycardia) ([place]) 85/88/5027   Chronic systolic heart failure ([place]) 10/12/2018   HTN (hypertension) 10/12/2018   Preop cardiovascular exam 12/10/2016   Hypertensive heart disease    Ischemic cardiomyopathy    Hypercholesterolemia    CKD (chronic kidney disease), stage III    Acute on chronic combined  systolic and diastolic CHF (congestive heart failure) ([place]) 09/19/2014   Hyperlipidemia 07/31/2014   Orthostatic hypotension 06/15/2014   Chronic kidney disease 05/21/2014    Chronic osteomyelitis of toe of left foot ([place]) 05/21/2014   Diabetic ulcer of left foot ([place]) 05/21/2014   Physical deconditioning 05/15/2014   S/P CABG x 4 05/13/2014   Diabetes mellitus type 2 with complications ([place]) 87/56/4332   CAD (coronary artery disease)      Past Medical History:  Diagnosis Date   CAD (coronary artery disease)    a. 04/2014 CABG x 4 (LIMA->LAD, VG->Diag, VG->OM, VG->PDA).   Chronic systolic CHF (congestive heart failure) ([place])    a. 07/2014 Echo: EF 30-35%.   CKD (chronic kidney disease), stage III ([place])    a. 12/2015 Creat 1.8.   Diabetic foot ulcers ([place])    a. left foot 2nd digit ant 5 th digit 05/03/14; b. 08/2014 s/p amputation of toes on L foot.   Hypercholesterolemia    a. 12/2015 TC 116, TG 154, HDL 24, LDL 61-->Atorvastatin 40.   Hypertension    Hypertensive heart disease    Ischemic cardiomyopathy    a. 07/2013 Echo: EF 20%; b. 07/2014 Echo: EF 30-35%, diff HK, Gr1 DD, mildly dil LA, nl PASP.   Myocardial infarction ([place])    Neuropathy in diabetes [facility])    Open wound    foot   Orthostatic hypotension    Peripheral vascular disease ([place])    Type II diabetes mellitus ([place])    a. 12/2015 HbA1c = 13.9.     Past Surgical History:  Procedure Laterality Date   ACHILLES TENDON SURGERY Right 01/22/2017   Procedure: ACHILLES LENGTHENING/KIDNER/TAL/Teno achilles lengthening;  Surgeon: Samara Deist, DPM;  Location: [facility] ORS;  Service: Podiatry;  Laterality: Right;   CARDIAC CATHETERIZATION     03/2014   CATARACT EXTRACTION     CORONARY ARTERY BYPASS GRAFT N/A 05/07/2014   Procedure: CORONARY ARTERY BYPASS GRAFTING (CABG);  Surgeon: Gaye Pollack, MD;  Location: [place];  Service: Open Heart Surgery;  Laterality: N/A;  Times 4 using left internal mammary artery and endoscopically harvested right saphenous vein   DIALYSIS/PERMA CATHETER INSERTION N/A 08/04/2019   Procedure: DIALYSIS/PERMA CATHETER INSERTION;  Surgeon: Katha Cabal, MD;  Location: [place] [facility];  Service: Cardiovascular;  Laterality: N/A;   DIALYSIS/PERMA CATHETER REMOVAL N/A 08/08/2019   Procedure: DIALYSIS/PERMA CATHETER REMOVAL;  Surgeon: Katha Cabal, MD;  Location: [place] [facility];  Service: Cardiovascular;  Laterality: N/A;   INTRAOPERATIVE TRANSESOPHAGEAL ECHOCARDIOGRAM N/A 05/07/2014   Procedure: INTRAOPERATIVE TRANSESOPHAGEAL ECHOCARDIOGRAM;  Surgeon: Gaye Pollack, MD;  Location: [place] [facility];  Service: Open Heart Surgery;  Laterality: N/A;   left foot osteomyelitis and wound after surgery     TOE AMPUTATION     Left 3 and 4 toes   TONSILECTOMY/ADENOIDECTOMY WITH MYRINGOTOMY      Social History   Socioeconomic History   Marital status: Married    Spouse name: Not on file   Number of children: Not on file   Years of education: Not on file   Highest education level: Not on file  Occupational History   Not on file  Social Needs   Financial resource strain: Not on file   Food insecurity    Worry: Not on file    Inability: Not on file   Transportation needs    Medical: Not on file    Non-medical: Not on file  Tobacco Use   Smoking status:  Never Smoker   Smokeless tobacco: Never Used  Substance and Sexual Activity   Alcohol use: No   Drug use: No   Sexual activity: Not on file  Lifestyle   Physical activity    Days per week: Not on file    Minutes per session: Not on file   Stress: Not on file  Relationships   Social connections    Talks on phone: Not on file    Gets together: Not on file    Attends religious service: Not on file    Active member of club or organization: Not on file    Attends meetings of clubs or organizations: Not on file    Relationship status: Not on file   Intimate partner violence    Fear of current or ex partner: Not on file    Emotionally abused: Not on file    Physically abused: Not on file    Forced sexual activity: Not on file  Other Topics Concern     Not on file  Social History Narrative   Not on file     Family History  Problem Relation Age of Onset   Heart disease Father        CABG in his 58's   Diabetes type II Other    Kidney disease Other    Hypertension Other    Ovarian cancer Mother      Current Outpatient Medications:    aspirin EC 81 MG tablet, Take 81 mg by mouth daily., Disp: , Rfl:    atorvastatin (LIPITOR) 40 MG tablet, Take 1 tablet (40 mg total) by mouth at bedtime., Disp: 90 tablet, Rfl: 3   carvedilol (COREG) 25 MG tablet, Take 1 tablet (25 mg total) by mouth 2 (two) times daily with a meal. (Patient taking differently: Take 25 mg by mouth at bedtime. ), Disp: 180 tablet, Rfl: 3   Cyanocobalamin (VITAMIN B 12 PO), Take 1 tablet by mouth at bedtime. , Disp: , Rfl:    famotidine (PEPCID) 20 MG tablet, TAKE 1 TABLET BY MOUTH EVERY DAY (Patient taking differently: Take 20 mg by mouth at bedtime. ), Disp: 90 tablet, Rfl: 0   ferrous sulfate 325 (65 FE) MG tablet, Take 325 mg by mouth daily., Disp: , Rfl:    hydrALAZINE (APRESOLINE) 50 MG tablet, Take 1 tablet (50 mg total) by mouth 3 (three) times daily., Disp: 90 tablet, Rfl: 0   HYDROmorphone (DILAUDID) 2 MG tablet, Take 1 mg by mouth every 12 (twelve) hours as needed for severe pain., Disp: , Rfl:    insulin aspart (NOVOLOG) 100 UNIT/ML injection, Inject 0-9 Units into the skin 3 (three) times daily with meals., Disp: 10 mL, Rfl: 11   insulin detemir (LEVEMIR) 100 UNIT/ML injection, Inject 0.15 mLs (15 Units total) into the skin daily., Disp: 10 mL, Rfl: 11   isosorbide mononitrate (IMDUR) 30 MG 24 hr tablet, Take 1 tablet (30 mg total) by mouth 2 (two) times daily., Disp: 60 tablet, Rfl: 0   multivitamin (RENA-VIT) TABS tablet, Take 1 tablet by mouth at bedtime., Disp: 30 tablet, Rfl: 0   nitroGLYCERIN (NITROSTAT) 0.4 MG SL tablet, Place 1 tablet (0.4 mg total) under the tongue every 5 (five) minutes as needed for chest pain., Disp: 30 tablet,  Rfl: 1   oxyCODONE 10 MG TABS, Take 1 tablet (10 mg total) by mouth every 8 (eight) hours as needed for moderate pain or breakthrough pain., Disp: 10 tablet, Rfl: 0   sodium bicarbonate  650 MG tablet, Take 1,300 mg by mouth 2 (two) times daily. , Disp: , Rfl:    torsemide (DEMADEX) 20 MG tablet, Take 2 tablets (40 mg total) by mouth daily., Disp: 30 tablet, Rfl: 1   Physical exam:  Vitals:   08/21/19 1525  BP: 133/71  Pulse: 79  Temp: 97.8 F (36.6 C)  TempSrc: Tympanic  Weight: 257 lb (116.6 kg)  Height: 6\' 1"  (1.854 m)   Physical Exam Constitutional:      General: He is not in acute distress. HENT:     Head: Normocephalic and atraumatic.  Eyes:     Pupils: Pupils are equal, round, and reactive to light.  Neck:     Musculoskeletal: Normal range of motion.  Cardiovascular:     Rate and Rhythm: Normal rate and regular rhythm.     Heart sounds: Normal heart sounds.  Pulmonary:     Effort: Pulmonary effort is normal.     Breath sounds: Normal breath sounds.  Abdominal:     General: Bowel sounds are normal.     Palpations: Abdomen is soft.  Skin:    General: Skin is warm and dry.  Neurological:     Mental Status: He is alert and oriented to person, place, and time.        CMP Latest Ref Rng & Units 08/18/2019  Glucose 70 - 99 mg/dL 173(H)  BUN 6 - 20 mg/dL 54(H)  Creatinine 0.61 - 1.24 mg/dL 3.25(H)  Sodium 135 - 145 mmol/L 143  Potassium 3.5 - 5.1 mmol/L 3.6  Chloride 98 - 111 mmol/L 107  CO2 22 - 32 mmol/L 27  Calcium 8.9 - 10.3 mg/dL 8.5(L)  Total Protein 6.5 - 8.1 g/dL -  Total Bilirubin 0.3 - 1.2 mg/dL -  Alkaline Phos 38 - 126 U/L -  AST 15 - 41 U/L -  ALT 0 - 44 U/L -   CBC Latest Ref Rng & Units 08/16/2019  WBC 4.0 - 10.5 K/uL 8.4  Hemoglobin 13.0 - 17.0 g/dL 7.7(L)  Hematocrit 39.0 - 52.0 % 25.1(L)  Platelets 150 - 400 K/uL 288    No images are attached to the encounter.  Dg Chest 2 View  Result Date: 08/14/2019 CLINICAL DATA:  Shortness of  breath.  Renal failure EXAM: CHEST - 2 VIEW COMPARISON:  July 27, 2019 FINDINGS: There is no edema or consolidation. There is slight cardiomegaly with pulmonary vascularity within normal limits. No adenopathy. Patient is status post coronary artery bypass grafting. No bone lesions. IMPRESSION: Mild cardiomegaly. Status post coronary artery bypass grafting. No evident edema or consolidation. Electronically Signed   By: Lowella Grip III M.D.   On: 08/14/2019 17:10   Dg Chest 2 View  Result Date: 07/27/2019 CLINICAL DATA:  Shortness of breath EXAM: CHEST - 2 VIEW COMPARISON:  10/19/2018 FINDINGS: Cardiomegaly status post median sternotomy. Pulmonary vascular prominence without overt edema or other airspace opacity. The visualized skeletal structures are unremarkable. IMPRESSION: Cardiomegaly. Pulmonary vascular prominence without overt edema or other airspace opacity. Electronically Signed   By: Eddie Candle M.D.   On: 07/27/2019 17:48   Ct Lumbar Spine Wo Contrast  Result Date: 07/27/2019 CLINICAL DATA:  Back pain EXAM: CT LUMBAR SPINE WITHOUT CONTRAST TECHNIQUE: Multidetector CT imaging of the lumbar spine was performed without intravenous contrast administration. Multiplanar CT image reconstructions were also generated. COMPARISON:  July 11, 2019 FINDINGS: Segmentation: There are 5 non-rib bearing lumbar type vertebral bodies with the last intervertebral disc space labeled as  L5-S1. Alignment: Normal Vertebrae: The vertebral body heights are well maintained. No fracture, malalignment, or pathologic osseous lesions seen. Paraspinal and other soft tissues: There is new soft tissue edema and focal heterogeneous soft tissue density seen overlying L4-L5 . Surrounding subcutaneous edema and skin thickening is seen within this region. The sacroiliac joints appear to be intact. Scattered aortic atherosclerosis is noted. Disc levels: Mild disc height loss and facet arthrosis most notable at L4-L5 and  L5-S1. IMPRESSION: There is new significant soft tissue edema and heterogeneous soft tissue collection seen at L4-L5 in the posterior paraspinal soft tissues which could represent early phlegmon versus soft tissue hematoma given the patient's history. If further evaluation is required would recommend MRI with contrast. These results were called by telephone at the time of interpretation on 07/27/2019 at 11:29 pm to provider PHILLIP STAFFORD , who verbally acknowledged these results. Electronically Signed   By: Prudencio Pair M.D.   On: 07/27/2019 23:30   Mr Lumbar Spine Wo Contrast  Result Date: 07/28/2019 CLINICAL DATA:  58 year old male with back pain. Abnormal subcutaneous lumbar paraspinal soft tissues on CT yesterday. EXAM: MRI LUMBAR SPINE WITHOUT CONTRAST TECHNIQUE: Multiplanar, multisequence MR imaging of the lumbar spine was performed. No intravenous contrast was administered. COMPARISON:  Lumbar spine CT 07/27/2019. FINDINGS: The patient was scanned in the lateral decubitus position due to pain, and subsequently the sagittal images are somewhat oblique. Segmentation:  Normal on the CT yesterday. Alignment:  Straightening of lumbar lordosis.  No spondylolisthesis. Vertebrae: No marrow edema or evidence of acute osseous abnormality. Visualized bone marrow signal is within normal limits. Intact visible sacrum and SI joints. Conus medullaris and cauda equina: Conus extends to the L1 level. No lower spinal cord or conus signal abnormality. Paraspinal and other soft tissues: Negative visible abdominal viscera. Large body habitus with abundant lumbar posterior subcutaneous edema. Overlying the L4 level to the right of midline there is a 7 centimeter area of confluent abnormal signal in the subcutaneous fat (series 9, image 17 and series 13, image 28). With associated inflammation in the underlying superficial aspect of the right lumbar rectus spine I muscles from L2-L3 inferiorly (series 14, images 20 through  28). No contrast was administered but there is no drainable fluid identified. No underlying bony changes are evident. Disc levels: T12-L1:  Negative. L1-L2:  Mild facet hypertrophy. L2-L3:  Mild facet hypertrophy. L3-L4:  Mild far lateral disc bulging.  No definite stenosis. L4-L5: Mild circumferential disc bulge. No spinal or lateral recess stenosis. Mild bilateral L4 foraminal stenosis probably greater on the left. L5-S1:  Negative disc.  Mild facet hypertrophy.  No stenosis. IMPRESSION: 1. Large round 7 centimeter nonspecific soft tissue lesion in the subcutaneous fat overlying the L4 vertebra to the right of midline. This could be an infectious phlegmon or a hematoma. There is underlying edema within the superficial aspect of the right erector spinae muscle from L2 to the sacrum, but no drainable fluid collection. There is abundant generalized subcutaneous edema throughout the lumbar spine. 2. No underlying acute osseous abnormality in the spine. 3. Relatively mild for age lumbar degeneration with no spinal stenosis and only mild bilateral L4 foraminal stenosis. Electronically Signed   By: Genevie Ann M.D.   On: 07/28/2019 16:21   Nm Pulmonary Perf And Vent  Result Date: 08/15/2019 CLINICAL DATA:  Shortness of breath EXAM: NUCLEAR MEDICINE VENTILATION - PERFUSION LUNG SCAN VIEWS: Anterior, posterior, left lateral, right lateral, RPO, LPO, Mertha Clyatt, LAO-ventilation and perfusion. RADIOPHARMACEUTICALS:  32.743 mCi  of Tc-69m DTPA aerosol inhalation and 4.134 mCi Tc29m MAA IV COMPARISON:  Chest radiograph August 14, 2019 FINDINGS: Ventilation: Radiotracer uptake is homogeneous and symmetric bilaterally. No ventilation defects evident. Perfusion: Radiotracer uptake is homogeneous and symmetric bilaterally. No perfusion defects evident. Heart is noted to be prominent. IMPRESSION: No appreciable ventilation or perfusion defects. Very low probability of pulmonary embolus. Prominent cardiac silhouette. Electronically  Signed   By: Lowella Grip III M.D.   On: 08/15/2019 13:44   [place] Fna Soft Tissue  Result Date: 07/31/2019 INDICATION: 58 year old male with spontaneous lumbar spine pain and evidence of cutaneous cellulitis with underlying edema/phlegmon/hematoma/abscess. Ultrasound evaluation demonstrates a very small amount of fluid. He presents for aspiration for culture. EXAM: US BIOPSY FNA W/ IMAGING MEDICATIONS: The patient is currently admitted to the hospital and receiving intravenous antibiotics. The antibiotics were administered within an appropriate time frame prior to the initiation of the procedure. ANESTHESIA/SEDATION: None COMPLICATIONS: None immediate. PROCEDURE: Informed written consent was obtained from the patient after a thorough discussion of the procedural risks, benefits and alternatives. All questions were addressed. A timeout was performed prior to the initiation of the procedure. Ultrasound was used to interrogate the region of clinical concern. There is a tiny crescent of fluid within the subcutaneous fat. Following sterile prep and drape in the standard fashion with chlorhexidine, local anesthesia was attained by infiltration with 1% lidocaine. A small dermatotomy was made. An 18 gauge trocar needle was then advanced into the fluid collection. Aspiration and agitation were performed yielding a small amount (1-2 mL) of serosanguineous fluid. No obvious purulence. IMPRESSION: Trace serosanguineous fluid successfully aspirated from the region of clinical concern. The specimen was sent for culture. No frank purulence encountered. Electronically Signed   By: Jacqulynn Cadet M.D.   On: 07/31/2019 13:01    Assessment and plan- Patient is a 58 y.o. male referred for normocytic anemia likely secondary to chronic disease andChronic kidney disease  Patient has multiple comorbidities including congestive cardiac failure with an EF of 30 to 35% and poorly controlled diabetes which are likely contributing  to his anemia.  He also has CKD and his most recent creatinine was 3.2.  I suspect that his anemia is secondary to this.  Today I will obtain a complete anemia work-up including CBC with differential, B12, folate, TSH, reticulocyte count, myeloma panel and serum free light chains.  Recent iron studies were normal  Video visit in 2 week's time to discuss the results of blood work.  If there is no other cause of anemia apparent other than chronic kidney disease he would be a candidate for EPO   Thank you for this kind referral and the opportunity to participate in the care of this patient   Visit Diagnosis 1. Anemia of chronic kidney failure, stage 4 (severe) ([place])   2. Anemia of chronic disease     Dr. Randa Evens, MD, MPH [facility] at [facility] [phone] 08/22/2019 9:26 AM

## 2019-08-21 NOTE — Patient Outreach (Signed)
[facility]) Care Management  08/21/2019  [name] [name] [birth year] [id number]   Referral Date:08/18/2019 Referral Source: Hospital Liaison Referral Reason: follow up after hospitalization- MD does TOC   Outreach Attempt: Spoke with patient and wife. Patient able to verify HIPAA.  Wife states that they were supposed to have the same services they had prior to hospitalization.  She states she has not heard from the nurse but OT called on yesterday. Discussed [facility] service versus home health.  She states they previous they had a nurse coming out and she would like to resume those services.  She says that [facility] was the agency.  She states that the home health nurse had also ordered a new scale, BP cuff, and O2 saturation monitor and they were waiting for that.    Advised her that CM would call [facility] to find out status. She verbalized understanding as she is supposed to go back to work next week and wants someone coming by to see patient to give her an idea of how he is doing before she goes back to work.    Telephone call to [facility].  Spoke with [employer].  She states they were not aware that patient was out of the hospital.  She states that she would call the doctor to get resumption of orders for Nursing, PT, OT, and social work.    Telephone call back to wife to advised her that home health would restart after resumption orders received for MD.  She verbalized understanding and thankful for the information but really does not want [facility] services at this time as she has enough on her plate with patient care and pending going back to work. Advised her of CM contact information just in case she changes her mind or has any other questions.  She is appreciative of the help and will call for questions.    Plan: RN CM will close case.     [name], RN, MSN [facility] Care Management Care Management Coordinator Direct Line [phone] Toll Free:  [phone]  Fax: [phone]

## 2019-08-22 ENCOUNTER — Encounter: Payer: Self-pay | Admitting: Oncology

## 2019-08-22 LAB — HAPTOGLOBIN: Haptoglobin: 77 mg/dL (ref 29–370)

## 2019-08-23 DIAGNOSIS — E1122 Type 2 diabetes mellitus with diabetic chronic kidney disease: Secondary | ICD-10-CM | POA: Diagnosis not present

## 2019-08-23 DIAGNOSIS — D631 Anemia in chronic kidney disease: Secondary | ICD-10-CM | POA: Diagnosis not present

## 2019-08-23 DIAGNOSIS — E1151 Type 2 diabetes mellitus with diabetic peripheral angiopathy without gangrene: Secondary | ICD-10-CM | POA: Diagnosis not present

## 2019-08-23 DIAGNOSIS — I255 Ischemic cardiomyopathy: Secondary | ICD-10-CM | POA: Diagnosis not present

## 2019-08-23 DIAGNOSIS — E1140 Type 2 diabetes mellitus with diabetic neuropathy, unspecified: Secondary | ICD-10-CM | POA: Diagnosis not present

## 2019-08-23 DIAGNOSIS — I130 Hypertensive heart and chronic kidney disease with heart failure and stage 1 through stage 4 chronic kidney disease, or unspecified chronic kidney disease: Secondary | ICD-10-CM | POA: Diagnosis not present

## 2019-08-23 DIAGNOSIS — I5023 Acute on chronic systolic (congestive) heart failure: Secondary | ICD-10-CM | POA: Diagnosis not present

## 2019-08-23 DIAGNOSIS — I2510 Atherosclerotic heart disease of native coronary artery without angina pectoris: Secondary | ICD-10-CM | POA: Diagnosis not present

## 2019-08-23 DIAGNOSIS — N184 Chronic kidney disease, stage 4 (severe): Secondary | ICD-10-CM | POA: Diagnosis not present

## 2019-08-23 LAB — MULTIPLE MYELOMA PANEL, SERUM
Albumin SerPl Elph-Mcnc: 3.2 g/dL (ref 2.9–4.4)
Albumin/Glob SerPl: 0.9 (ref 0.7–1.7)
Alpha 1: 0.3 g/dL (ref 0.0–0.4)
Alpha2 Glob SerPl Elph-Mcnc: 0.7 g/dL (ref 0.4–1.0)
B-Globulin SerPl Elph-Mcnc: 1 g/dL (ref 0.7–1.3)
Gamma Glob SerPl Elph-Mcnc: 2.1 g/dL — ABNORMAL HIGH (ref 0.4–1.8)
Globulin, Total: 4 g/dL — ABNORMAL HIGH (ref 2.2–3.9)
IgA: 591 mg/dL — ABNORMAL HIGH (ref 90–386)
IgG (Immunoglobin G), Serum: 2125 mg/dL — ABNORMAL HIGH (ref 603–1613)
IgM (Immunoglobulin M), Srm: 145 mg/dL (ref 20–172)
Total Protein ELP: 7.2 g/dL (ref 6.0–8.5)

## 2019-08-23 NOTE — Progress Notes (Deleted)
Patient ID: [name] [name], male    DOB: 1961-04-06, 58 y.o.   MRN: [id number]  HPI  [name] [name] is a 58 y/o male with a history of CAD, DM, hyperlipidemia, HTN, CKD, PVD,orthostatic hypotension and chronic heart failure.   Echo report from 06/29/2019 reviewed and showed an EF of 30-35%. Echo report from 10/05/18 reviewed and showed an EF of 30-35% along with mild [name].   Cardiac catheterization done 04/03/14 which showed an EF of 40%.  Admitted 08/14/2019 due to shortness of breath and syncope. Cardiology consult obtained. VQ scan was negative for PE. Patient had 19 beat run of NSVT. Discharged after 4 days. Admitted 07/27/2019 due to acute on chronic HF. Cardiology, vascular and ID consults obtained. Needed transient dialysis. Received 1 unit of PRBC's. Discharged after 12 days.   He presents today for a follow-up visit with a chief complaint of   Past Medical History:  Diagnosis Date  . CAD (coronary artery disease)    a. 04/2014 CABG x 4 (LIMA->LAD, VG->Diag, VG->OM, VG->PDA).  . Chronic systolic CHF (congestive heart failure) ([place])    a. 07/2014 Echo: EF 30-35%.  . CKD (chronic kidney disease), stage III    a. 12/2015 Creat 1.8.  . Diabetic foot ulcers ([place])    a. left foot 2nd digit ant 5 th digit 05/03/14; b. 08/2014 s/p amputation of toes on L foot.  . Hypercholesterolemia    a. 12/2015 TC 116, TG 154, HDL 24, LDL 61-->Atorvastatin 40.  Marland Kitchen Hypertension   . Hypertensive heart disease   . Ischemic cardiomyopathy    a. 07/2013 Echo: EF 20%; b. 07/2014 Echo: EF 30-35%, diff HK, Gr1 DD, mildly dil LA, nl PASP.  Marland Kitchen Myocardial infarction ([place])   . Neuropathy in diabetes ([place])   . Open wound    foot  . Orthostatic hypotension   . Peripheral vascular disease ([place])   . Type II diabetes mellitus ([place])    a. 12/2015 HbA1c = 13.9.   Past Surgical History:  Procedure Laterality Date  . ACHILLES TENDON SURGERY Right 01/22/2017   Procedure: ACHILLES LENGTHENING/KIDNER/TAL/Teno achilles lengthening;   Surgeon: Samara Deist, DPM;  Location: [facility];  Service: Podiatry;  Laterality: Right;  . CARDIAC CATHETERIZATION     03/2014  . CATARACT EXTRACTION    . CORONARY ARTERY BYPASS GRAFT N/A 05/07/2014   Procedure: CORONARY ARTERY BYPASS GRAFTING (CABG);  Surgeon: Gaye Pollack, MD;  Location: [place];  Service: Open Heart Surgery;  Laterality: N/A;  Times 4 using left internal mammary artery and endoscopically harvested right saphenous vein  . DIALYSIS/PERMA CATHETER INSERTION N/A 08/04/2019   Procedure: DIALYSIS/PERMA CATHETER INSERTION;  Surgeon: Katha Cabal, MD;  Location: [place] [facility];  Service: Cardiovascular;  Laterality: N/A;  . DIALYSIS/PERMA CATHETER REMOVAL N/A 08/08/2019   Procedure: DIALYSIS/PERMA CATHETER REMOVAL;  Surgeon: Katha Cabal, MD;  Location: [place] [facility];  Service: Cardiovascular;  Laterality: N/A;  . INTRAOPERATIVE TRANSESOPHAGEAL ECHOCARDIOGRAM N/A 05/07/2014   Procedure: INTRAOPERATIVE TRANSESOPHAGEAL ECHOCARDIOGRAM;  Surgeon: Gaye Pollack, MD;  Location: [place] [facility];  Service: Open Heart Surgery;  Laterality: N/A;  . left foot osteomyelitis and wound after surgery    . TOE AMPUTATION     Left 3 and 4 toes  . TONSILECTOMY/ADENOIDECTOMY WITH MYRINGOTOMY     Family History  Problem Relation Age of Onset  . Heart disease Father        CABG in his 50's  . Diabetes type II Other   .  Kidney disease Other   . Hypertension Other   . Ovarian cancer Mother    Social History   Tobacco Use  . Smoking status: Never Smoker  . Smokeless tobacco: Never Used  Substance Use Topics  . Alcohol use: No   Allergies  Allergen Reactions  . 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 Doesn't believe he has an allergy to PCN as he has taken it previously, according to wife.  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 Doesn't  believe he has an allergy to PCN as he has taken it previously, according to wife.     Review of Systems  Constitutional: Positive for fatigue. Negative for appetite change.  HENT: Negative for congestion, postnasal drip and sore throat.   Eyes: Negative.   Respiratory: Positive for shortness of breath (with moderate exertion). Negative for cough and chest tightness.   Cardiovascular: Negative for chest pain, palpitations and leg swelling.  Gastrointestinal: Negative for abdominal distention and abdominal pain.  Endocrine: Negative.   Genitourinary: Negative.   Musculoskeletal: Negative for back pain and neck pain.  Skin: Positive for wound (right foot).  Allergic/Immunologic: Negative.   Neurological: Positive for light-headedness (at times) and numbness (neuropathy). Negative for dizziness.  Hematological: Negative for adenopathy. Does not bruise/bleed easily.  Psychiatric/Behavioral: Negative for dysphoric mood and sleep disturbance (sleeping on 1-2 pillows). The patient is not nervous/anxious.      Physical Exam Vitals signs and nursing note reviewed.  Constitutional:      Appearance: He is well-developed.  HENT:     Head: Normocephalic and atraumatic.  Neck:     Musculoskeletal: Normal range of motion.  Cardiovascular:     Rate and Rhythm: Normal rate and regular rhythm.  Pulmonary:     Effort: Pulmonary effort is normal.     Breath sounds: No wheezing or rales.  Abdominal:     General: There is no distension.     Palpations: Abdomen is soft.  Musculoskeletal:        General: No tenderness.  Skin:    General: Skin is warm and dry.  Neurological:     Mental Status: He is alert and oriented to person, place, and time.  Psychiatric:        Behavior: Behavior normal.        Thought Content: Thought content normal.    Assessment & Plan:  1: Chronic heart failure with reduced ejection fraction- - NYHA class II - euvolemic - weighing daily; reminded to call for an  overnight weight gain of >2 pounds or a weekly weight gain of >5 pounds - weight down 263.2pound from last visit here 9 months ago - not adding salt to his food and has been reading food labels. Reviewed the importance of closely following a 2000mg  sodium diet  - saw cardiology ([place]) 07/06/2019 -saw EP Caryl Comes) 01/19/2019 - BNP 10/04/18 was 1648.0   2: HTN- - BP  - saw PCP (Hande) 07/21/2019 - BMP 09/10/2019 reviewed and showed sodium 140, potassium 4.4, creatinine 4.29 and GFR 14  3: DM-  - seeing podiatry due to foot ulcer; last seen 10/31/18 - A1c 07/28/2019 was 7.2%  Patient did not bring his medications nor a list. Each medication was verbally reviewed with the patient and he was encouraged to bring the bottles to every visit to confirm accuracy of list.

## 2019-08-24 ENCOUNTER — Other Ambulatory Visit: Payer: Self-pay

## 2019-08-24 ENCOUNTER — Other Ambulatory Visit: Admission: RE | Admit: 2019-08-24 | Payer: Medicare HMO | Source: Ambulatory Visit

## 2019-08-24 DIAGNOSIS — I130 Hypertensive heart and chronic kidney disease with heart failure and stage 1 through stage 4 chronic kidney disease, or unspecified chronic kidney disease: Secondary | ICD-10-CM | POA: Diagnosis not present

## 2019-08-24 DIAGNOSIS — E1140 Type 2 diabetes mellitus with diabetic neuropathy, unspecified: Secondary | ICD-10-CM | POA: Diagnosis not present

## 2019-08-24 DIAGNOSIS — I509 Heart failure, unspecified: Secondary | ICD-10-CM | POA: Diagnosis not present

## 2019-08-24 DIAGNOSIS — N179 Acute kidney failure, unspecified: Secondary | ICD-10-CM | POA: Diagnosis not present

## 2019-08-24 DIAGNOSIS — I255 Ischemic cardiomyopathy: Secondary | ICD-10-CM | POA: Diagnosis not present

## 2019-08-24 DIAGNOSIS — I129 Hypertensive chronic kidney disease with stage 1 through stage 4 chronic kidney disease, or unspecified chronic kidney disease: Secondary | ICD-10-CM | POA: Diagnosis not present

## 2019-08-24 DIAGNOSIS — D631 Anemia in chronic kidney disease: Secondary | ICD-10-CM | POA: Diagnosis not present

## 2019-08-24 DIAGNOSIS — E872 Acidosis: Secondary | ICD-10-CM | POA: Diagnosis not present

## 2019-08-24 DIAGNOSIS — E1151 Type 2 diabetes mellitus with diabetic peripheral angiopathy without gangrene: Secondary | ICD-10-CM | POA: Diagnosis not present

## 2019-08-24 DIAGNOSIS — N184 Chronic kidney disease, stage 4 (severe): Secondary | ICD-10-CM | POA: Diagnosis not present

## 2019-08-24 DIAGNOSIS — I5023 Acute on chronic systolic (congestive) heart failure: Secondary | ICD-10-CM | POA: Diagnosis not present

## 2019-08-24 NOTE — Patient Outreach (Signed)
[facility]) Care Management  08/24/2019  OVILA LEPAGE 05/01/1961 [id number]   EMMI- General Discharge RED ON EMMI ALERT Day # 4 Date: 08/23/2019 Red Alert Reason:  Lost interest in things? yes  Outreach attempt: spoke with patient. He is able to verify HIPAA.  He states that he is doing good.  Addressed red alert.  He states that was a mistake and denies any problems with depression.  He currently continues to decline need for [facility] services at this time.     Plan: RN CM will close case.     [name], RN, MSN [facility] Care Management Care Management Coordinator Direct Line [phone] Toll Free: [phone]  Fax: [phone]

## 2019-08-25 ENCOUNTER — Ambulatory Visit: Payer: Medicare HMO | Admitting: Family

## 2019-08-25 DIAGNOSIS — I255 Ischemic cardiomyopathy: Secondary | ICD-10-CM | POA: Diagnosis not present

## 2019-08-25 DIAGNOSIS — D631 Anemia in chronic kidney disease: Secondary | ICD-10-CM | POA: Diagnosis not present

## 2019-08-25 DIAGNOSIS — E1151 Type 2 diabetes mellitus with diabetic peripheral angiopathy without gangrene: Secondary | ICD-10-CM | POA: Diagnosis not present

## 2019-08-25 DIAGNOSIS — E1122 Type 2 diabetes mellitus with diabetic chronic kidney disease: Secondary | ICD-10-CM | POA: Diagnosis not present

## 2019-08-25 DIAGNOSIS — E1140 Type 2 diabetes mellitus with diabetic neuropathy, unspecified: Secondary | ICD-10-CM | POA: Diagnosis not present

## 2019-08-25 DIAGNOSIS — N184 Chronic kidney disease, stage 4 (severe): Secondary | ICD-10-CM | POA: Diagnosis not present

## 2019-08-25 DIAGNOSIS — I130 Hypertensive heart and chronic kidney disease with heart failure and stage 1 through stage 4 chronic kidney disease, or unspecified chronic kidney disease: Secondary | ICD-10-CM | POA: Diagnosis not present

## 2019-08-25 DIAGNOSIS — I5023 Acute on chronic systolic (congestive) heart failure: Secondary | ICD-10-CM | POA: Diagnosis not present

## 2019-08-25 DIAGNOSIS — I2510 Atherosclerotic heart disease of native coronary artery without angina pectoris: Secondary | ICD-10-CM | POA: Diagnosis not present

## 2019-08-28 ENCOUNTER — Encounter: Admission: RE | Payer: Self-pay | Source: Home / Self Care

## 2019-08-28 ENCOUNTER — Ambulatory Visit: Admit: 2019-08-28 | Payer: Medicare HMO | Admitting: Gastroenterology

## 2019-08-28 ENCOUNTER — Other Ambulatory Visit: Payer: Self-pay

## 2019-08-28 ENCOUNTER — Ambulatory Visit: Admission: RE | Admit: 2019-08-28 | Payer: Medicare HMO | Source: Home / Self Care | Admitting: Gastroenterology

## 2019-08-28 ENCOUNTER — Encounter: Payer: Self-pay | Admitting: Oncology

## 2019-08-28 SURGERY — COLONOSCOPY WITH PROPOFOL
Anesthesia: Choice

## 2019-08-28 SURGERY — COLONOSCOPY WITH PROPOFOL
Anesthesia: General

## 2019-08-28 NOTE — Progress Notes (Signed)
Patient stated that she had been doing well with no complaints. Patient stated that he had been at the hospital recently (08/14/2019). Patient would talk to you about his kidneys since he has an appointment scheduled with Dr. Candiss Norse.

## 2019-08-29 ENCOUNTER — Ambulatory Visit: Payer: Medicare HMO | Admitting: Gastroenterology

## 2019-08-29 ENCOUNTER — Inpatient Hospital Stay (HOSPITAL_BASED_OUTPATIENT_CLINIC_OR_DEPARTMENT_OTHER): Payer: Medicare HMO | Admitting: Oncology

## 2019-08-29 DIAGNOSIS — I2510 Atherosclerotic heart disease of native coronary artery without angina pectoris: Secondary | ICD-10-CM | POA: Diagnosis not present

## 2019-08-29 DIAGNOSIS — E1140 Type 2 diabetes mellitus with diabetic neuropathy, unspecified: Secondary | ICD-10-CM | POA: Diagnosis not present

## 2019-08-29 DIAGNOSIS — I10 Essential (primary) hypertension: Secondary | ICD-10-CM | POA: Diagnosis not present

## 2019-08-29 DIAGNOSIS — N189 Chronic kidney disease, unspecified: Secondary | ICD-10-CM

## 2019-08-29 DIAGNOSIS — I5023 Acute on chronic systolic (congestive) heart failure: Secondary | ICD-10-CM | POA: Diagnosis not present

## 2019-08-29 DIAGNOSIS — N184 Chronic kidney disease, stage 4 (severe): Secondary | ICD-10-CM | POA: Diagnosis not present

## 2019-08-29 DIAGNOSIS — R808 Other proteinuria: Secondary | ICD-10-CM | POA: Diagnosis not present

## 2019-08-29 DIAGNOSIS — I255 Ischemic cardiomyopathy: Secondary | ICD-10-CM | POA: Diagnosis not present

## 2019-08-29 DIAGNOSIS — D631 Anemia in chronic kidney disease: Secondary | ICD-10-CM

## 2019-08-29 DIAGNOSIS — E1151 Type 2 diabetes mellitus with diabetic peripheral angiopathy without gangrene: Secondary | ICD-10-CM | POA: Diagnosis not present

## 2019-08-29 DIAGNOSIS — E1122 Type 2 diabetes mellitus with diabetic chronic kidney disease: Secondary | ICD-10-CM | POA: Diagnosis not present

## 2019-08-29 DIAGNOSIS — I130 Hypertensive heart and chronic kidney disease with heart failure and stage 1 through stage 4 chronic kidney disease, or unspecified chronic kidney disease: Secondary | ICD-10-CM | POA: Diagnosis not present

## 2019-08-29 DIAGNOSIS — E1129 Type 2 diabetes mellitus with other diabetic kidney complication: Secondary | ICD-10-CM | POA: Diagnosis not present

## 2019-08-30 ENCOUNTER — Telehealth (INDEPENDENT_AMBULATORY_CARE_PROVIDER_SITE_OTHER): Payer: Medicare HMO | Admitting: Cardiovascular Disease

## 2019-08-30 ENCOUNTER — Encounter: Payer: Self-pay | Admitting: Cardiovascular Disease

## 2019-08-30 ENCOUNTER — Other Ambulatory Visit: Payer: Self-pay

## 2019-08-30 VITALS — BP 123/78 | HR 72 | Ht 73.0 in | Wt 240.0 lb

## 2019-08-30 DIAGNOSIS — I5023 Acute on chronic systolic (congestive) heart failure: Secondary | ICD-10-CM | POA: Diagnosis not present

## 2019-08-30 DIAGNOSIS — I255 Ischemic cardiomyopathy: Secondary | ICD-10-CM

## 2019-08-30 DIAGNOSIS — I5022 Chronic systolic (congestive) heart failure: Secondary | ICD-10-CM | POA: Diagnosis not present

## 2019-08-30 DIAGNOSIS — E118 Type 2 diabetes mellitus with unspecified complications: Secondary | ICD-10-CM

## 2019-08-30 DIAGNOSIS — E1140 Type 2 diabetes mellitus with diabetic neuropathy, unspecified: Secondary | ICD-10-CM | POA: Diagnosis not present

## 2019-08-30 DIAGNOSIS — E785 Hyperlipidemia, unspecified: Secondary | ICD-10-CM

## 2019-08-30 DIAGNOSIS — E1151 Type 2 diabetes mellitus with diabetic peripheral angiopathy without gangrene: Secondary | ICD-10-CM | POA: Diagnosis not present

## 2019-08-30 DIAGNOSIS — I4729 Other ventricular tachycardia: Secondary | ICD-10-CM

## 2019-08-30 DIAGNOSIS — N184 Chronic kidney disease, stage 4 (severe): Secondary | ICD-10-CM | POA: Diagnosis not present

## 2019-08-30 DIAGNOSIS — E1122 Type 2 diabetes mellitus with diabetic chronic kidney disease: Secondary | ICD-10-CM | POA: Diagnosis not present

## 2019-08-30 DIAGNOSIS — I130 Hypertensive heart and chronic kidney disease with heart failure and stage 1 through stage 4 chronic kidney disease, or unspecified chronic kidney disease: Secondary | ICD-10-CM | POA: Diagnosis not present

## 2019-08-30 DIAGNOSIS — I472 Ventricular tachycardia: Secondary | ICD-10-CM | POA: Diagnosis not present

## 2019-08-30 DIAGNOSIS — I2510 Atherosclerotic heart disease of native coronary artery without angina pectoris: Secondary | ICD-10-CM

## 2019-08-30 DIAGNOSIS — D631 Anemia in chronic kidney disease: Secondary | ICD-10-CM | POA: Diagnosis not present

## 2019-08-30 NOTE — Progress Notes (Signed)
Virtual Visit via Video Note   This visit type was conducted due to national recommendations for restrictions regarding the COVID-19 Pandemic (e.g. social distancing) in an effort to limit this patient's exposure and mitigate transmission in our community.  Due to his co-morbid illnesses, this patient is at least at moderate risk for complications without adequate follow up.  This format is felt to be most appropriate for this patient at this time.  All issues noted in this document were discussed and addressed.  A limited physical exam was performed with this format.  Please refer to the patient's chart for his consent to telehealth for [facility].   I connected with  [name] on 08/30/19 by a video enabled telemedicine application and verified that I am speaking with the correct person using two identifiers. I discussed the limitations of evaluation and management by telemedicine. The patient expressed understanding and agreed to proceed.   Evaluation Performed:  Follow-up visit  Date:  08/30/2019   ID:  [name] [name], DOB 27-Sep-1961, MRN [id number]  Patient Location:  [place] [address]   Provider location:   [facility], [place] office  PCP:  Tracie Harrier, MD  Cardiologist:  Patsy Baltimore  Chief Complaint:  CHF follow up   History of Present Illness:    [name] [name] is a 58 y.o. male who presents via audio/video conferencing for a telehealth visit today.   The patient does not symptoms concerning for COVID-19 infection (fever, chills, cough, or new SHORTNESS OF BREATH).   Patient has a past medical history of diabetes, poorly controlled with poor diet, finances limiting medication compliance hyperlipidemia,  coronary artery disease with bypass surgery x4 at the end of June 2015 with Dr. Cyndia Bent, orthostatic hypotension  EF at first <20%, Hospitalization November 2019 for acute on chronic systolic CHF ejection fraction  30 to 35%, nonsustained VT on monitors  Syncope,  EF 30 to 35% in 06/2019 who presents for routine follow-up Of his coronary artery disease, history of bypass  Recent hospitalization October 2020, syncope Hypoxia on arrival in the setting of CHF  concern for sustained VT on telemetry EP unable to evaluate patient on an inpatient basis -LifeVest has been placed  Ejection fraction 30 to 35% previous MI, known ischemic   Had significant diuresis during his hospital course -Recommend torsemide 40 daily 3 pound weight gain with take torsemide 40 twice daily  Medication changes madehydralazine up to 50mg  3 times daily, isosorbide 30 twice daily, continue carvedilol  On todays visit, feels good Sugars high, 174 and higher, usually 200s levimer 20 units yesterday  Weight down to 240 pounds  changed diet Cr 3.25 up to 4.29, repeat down to 3.5 with nephrology  With fluid out, arrhythmia/palpitations are better since he has been home  Vitals today BP today 123/78, pulse 78 bpm  Echo 06/29/2019 The left ventricle has moderate-severely reduced systolic function, with an ejection fraction of 30-35%.  Echo  10/05/2018 showed an EF of 30 to 35%, diffuse hypokinesis   nuclear stress test on 10/07/2018 that showed a large region of fixed inferior and inferior lateral wall perfusion defect consistent with previous MI , EF was estimated at 36%.   10/16/2018 progressive swelling, dyspnea, and orthopnea  present to the ED for further evaluation. wife called again on 12/2 noting he was still having increased shortness of breath with associated orthopnea.   Event Monitor Normal sinus rhythm avg HR  of 81 bpm. ---8 Ventricular Tachycardia runs occurred, the run with the fastest interval lasting 4 beats with a max rate of 200 bpm,  the longest lasting 17 beats with an avg rate of 120 bpm.  ----7 Supraventricular Tachycardia runs occurred,  the run with the fastest interval lasting 5 beats with  a max rate of 148 bpm,  the longest lasting 13 beats with an avg rate of 110 bpm.   history in October 2014 of MRSA, requiring amputation of several of his toes, severe renal dysfunction at that time felt secondary to antibiotics with subsequent improvement of his renal function. Prior echocardiogram September 2014 showing ejection fraction less than 20% ejection fraction was 30-35% by echo 08/02/2014  cardiac catheterization may 2015 showing 70% left main disease, 70% diagonal #2 disease, 80% proximal circumflex disease, 80% mid circumflex disease, 99% proximal RCA disease, 99% PL branch disease, ejection fraction 40%   Prior CV studies:   The following studies were reviewed today:    Past Medical History:  Diagnosis Date  . CAD (coronary artery disease)    a. 04/2014 CABG x 4 (LIMA->LAD, VG->Diag, VG->OM, VG->PDA).  . Chronic systolic CHF (congestive heart failure) ([place])    a. 07/2014 Echo: EF 30-35%.  . CKD (chronic kidney disease), stage III    a. 12/2015 Creat 1.8.  . Diabetic foot ulcers ([place])    a. left foot 2nd digit ant 5 th digit 05/03/14; b. 08/2014 s/p amputation of toes on L foot.  . Hypercholesterolemia    a. 12/2015 TC 116, TG 154, HDL 24, LDL 61-->Atorvastatin 40.  Marland Kitchen Hypertension   . Hypertensive heart disease   . Ischemic cardiomyopathy    a. 07/2013 Echo: EF 20%; b. 07/2014 Echo: EF 30-35%, diff HK, Gr1 DD, mildly dil LA, nl PASP.  Marland Kitchen Myocardial infarction ([place])   . Neuropathy in diabetes ([place])   . Open wound    foot  . Orthostatic hypotension   . Peripheral vascular disease ([place])   . Type II diabetes mellitus ([place])    a. 12/2015 HbA1c = 13.9.   Past Surgical History:  Procedure Laterality Date  . ACHILLES TENDON SURGERY Right 01/22/2017   Procedure: ACHILLES LENGTHENING/KIDNER/TAL/Teno achilles lengthening;  Surgeon: Samara Deist, DPM;  Location: [facility];  Service: Podiatry;  Laterality: Right;  . CARDIAC CATHETERIZATION     03/2014  . CATARACT EXTRACTION     . CORONARY ARTERY BYPASS GRAFT N/A 05/07/2014   Procedure: CORONARY ARTERY BYPASS GRAFTING (CABG);  Surgeon: Gaye Pollack, MD;  Location: [place];  Service: Open Heart Surgery;  Laterality: N/A;  Times 4 using left internal mammary artery and endoscopically harvested right saphenous vein  . DIALYSIS/PERMA CATHETER INSERTION N/A 08/04/2019   Procedure: DIALYSIS/PERMA CATHETER INSERTION;  Surgeon: Katha Cabal, MD;  Location: [place] [facility];  Service: Cardiovascular;  Laterality: N/A;  . DIALYSIS/PERMA CATHETER REMOVAL N/A 08/08/2019   Procedure: DIALYSIS/PERMA CATHETER REMOVAL;  Surgeon: Katha Cabal, MD;  Location: [place] [facility];  Service: Cardiovascular;  Laterality: N/A;  . INTRAOPERATIVE TRANSESOPHAGEAL ECHOCARDIOGRAM N/A 05/07/2014   Procedure: INTRAOPERATIVE TRANSESOPHAGEAL ECHOCARDIOGRAM;  Surgeon: Gaye Pollack, MD;  Location: [place] [facility];  Service: Open Heart Surgery;  Laterality: N/A;  . left foot osteomyelitis and wound after surgery    . TOE AMPUTATION     Left 3 and 4 toes  . TONSILECTOMY/ADENOIDECTOMY WITH MYRINGOTOMY        Allergies:   Unasyn [ampicillin-sulbactam sodium] and Zosyn [piperacillin sod-tazobactam so]  Social History   Tobacco Use  . Smoking status: Never Smoker  . Smokeless tobacco: Never Used  Substance Use Topics  . Alcohol use: No  . Drug use: No     Current Outpatient Medications on File Prior to Visit  Medication Sig Dispense Refill  . aspirin EC 81 MG tablet Take 81 mg by mouth daily.    Marland Kitchen atorvastatin (LIPITOR) 40 MG tablet Take 1 tablet (40 mg total) by mouth at bedtime. 90 tablet 3  . carvedilol (COREG) 25 MG tablet Take 1 tablet (25 mg total) by mouth 2 (two) times daily with a meal. (Patient taking differently: Take 25 mg by mouth at bedtime. ) 180 tablet 3  . Cyanocobalamin (VITAMIN B 12 PO) Take 1 tablet by mouth at bedtime.     . famotidine (PEPCID) 20 MG tablet TAKE 1 TABLET BY MOUTH EVERY DAY (Patient taking differently:  Take 20 mg by mouth at bedtime. ) 90 tablet 0  . ferrous sulfate 325 (65 FE) MG tablet Take 325 mg by mouth daily.    Marland Kitchen glimepiride (AMARYL) 4 MG tablet Take 1 tablet by mouth 2 (two) times daily.    . hydrALAZINE (APRESOLINE) 50 MG tablet Take 1 tablet (50 mg total) by mouth 3 (three) times daily. 90 tablet 0  . insulin aspart (NOVOLOG) 100 UNIT/ML injection Inject 0-9 Units into the skin 3 (three) times daily with meals. 10 mL 11  . insulin detemir (LEVEMIR) 100 UNIT/ML injection Inject 0.15 mLs (15 Units total) into the skin daily. (Patient taking differently: Inject 20 Units into the skin daily. ) 10 mL 11  . isosorbide mononitrate (IMDUR) 30 MG 24 hr tablet Take 1 tablet (30 mg total) by mouth 2 (two) times daily. 60 tablet 0  . multivitamin (RENA-VIT) TABS tablet Take 1 tablet by mouth at bedtime. 30 tablet 0  . nitroGLYCERIN (NITROSTAT) 0.4 MG SL tablet Place 1 tablet (0.4 mg total) under the tongue every 5 (five) minutes as needed for chest pain. 30 tablet 1  . sodium bicarbonate 650 MG tablet Take 1,300 mg by mouth 2 (two) times daily.     Marland Kitchen torsemide (DEMADEX) 20 MG tablet Take 2 tablets (40 mg total) by mouth daily. 30 tablet 1   No current facility-administered medications on file prior to visit.      Family Hx: The patient's family history includes Diabetes type II in an other family member; Heart disease in his father; Hypertension in an other family member; Kidney disease in an other family member; Ovarian cancer in his mother.  ROS:   Please see the history of present illness.    Review of Systems  Constitutional: Negative.   HENT: Negative.   Respiratory: Negative.   Cardiovascular: Negative.   Gastrointestinal: Negative.   Musculoskeletal: Negative.   Neurological: Negative.   Psychiatric/Behavioral: Negative.   All other systems reviewed and are negative.    Labs/Other Tests and Data Reviewed:    Recent Labs: 08/14/2019: B Natriuretic Peptide 2,227.0; Magnesium  2.2 08/21/2019: ALT 24; BUN 61; Creatinine, Ser 4.29; Hemoglobin 8.7; Platelets 252; Potassium 4.4; Sodium 140; TSH 1.813   Recent Lipid Panel No results found for: CHOL, TRIG, HDL, CHOLHDL, LDLCALC, LDLDIRECT  Wt Readings from Last 3 Encounters:  08/30/19 240 lb (108.9 kg)  08/21/19 257 lb (116.6 kg)  08/18/19 250 lb 6.4 oz (113.6 kg)     Exam:    Vital Signs: Vital signs may also be detailed in the HPI BP 123/78  Pulse 72   Ht 6\' 1"  (1.854 m)   Wt 240 lb (108.9 kg)   BMI 31.66 kg/m   Wt Readings from Last 3 Encounters:  08/30/19 240 lb (108.9 kg)  08/21/19 257 lb (116.6 kg)  08/18/19 250 lb 6.4 oz (113.6 kg)   Temp Readings from Last 3 Encounters:  08/21/19 97.8 F (36.6 C) (Tympanic)  08/18/19 97.9 F (36.6 C) (Oral)  08/08/19 98.2 F (36.8 C) (Oral)   BP Readings from Last 3 Encounters:  08/30/19 123/78  08/21/19 133/71  08/18/19 (!) 150/71   Pulse Readings from Last 3 Encounters:  08/30/19 72  08/21/19 79  08/18/19 80     Well nourished, well developed male in no acute distress. Constitutional:  oriented to person, place, and time. No distress.  Head: Normocephalic and atraumatic.  Eyes:  no discharge. No scleral icterus.  Neck: Normal range of motion. Neck supple.  Pulmonary/Chest: No audible wheezing, no distress, appears comfortable Musculoskeletal: Normal range of motion.  no  tenderness or deformity.  Neurological:   Coordination normal. Full exam not performed Skin:  No rash Psychiatric:  normal mood and affect. behavior is normal. Thought content normal.    ASSESSMENT & PLAN:    Problem List Items Addressed This Visit      Cardiology Problems   NSVT (nonsustained ventricular tachycardia) (HCC)   Chronic systolic heart failure (HCC) (Chronic)   CAD (coronary artery disease) - Primary   Ischemic cardiomyopathy     Other   Diabetes mellitus type 2 with complications ([place])    Other Visit Diagnoses    CKD (chronic kidney disease), stage IV  (HCC)       Hyperlipidemia LDL goal <70         Ischemic cardiomyopathy Prior history of coronary disease, CABG, large region of infarct noted on prior stress testing, Long history nonsustained VT noted on hospital visits November 2019, outpatient telemetry monitoring, again on recent hospitalization several weeks ago, recent syncope episode concerning for nonsustained VT in the setting of heart failure, he does appreciate palpitations concerning for nonsustained VT, with some improvement as heart failure symptoms have improved -Goal weight 240, followed by Dr. Candiss Norse nephrology Select medications held such as ACE inhibitor, ARB, Entresto, spironolactone given renal failure creatinine 3.5 up to 4 -He has LifeVest in place following recent hospitalization Follow-up with Dr. Caryl Comes EP on October 20 for consideration of EP evaluation, and possible ICD -Prior stress test November 2019 for ejection fraction 30 to 35%, no significant ischemia noted, prior large region infarct noted, findings consistent with prior stress test April 2019 and several years prior  COVID-19 Education: The signs and symptoms of COVID-19 were discussed with the patient and how to seek care for testing (follow up with PCP or arrange E-visit).  The importance of social distancing was discussed today.  Patient Risk:   After full review of this patients clinical status, I feel that they are at least moderate risk at this time.  Time:   Today, I have spent 25 minutes with the patient with telehealth technology discussing the cardiac and medical problems/diagnoses detailed above   Additional 10 min spent reviewing the chart prior to patient visit today   Medication Adjustments/Labs and Tests Ordered: Current medicines are reviewed at length with the patient today.  Concerns regarding medicines are outlined above.   Tests Ordered: No tests ordered   Medication Changes: No changes made   Disposition: Follow-up in 6  months   Signed, Christia Reading  Rockey Situ, [place] Office [address] [place] [address], [place], [place] [address]

## 2019-08-30 NOTE — Patient Instructions (Addendum)
Medication Instructions:  No changes  If you need a refill on your cardiac medications before your next appointment, please call your pharmacy.    Lab work: No new labs needed   If you have labs (blood work) drawn today and your tests are completely normal, you will receive your results only by: Marland Kitchen MyChart Message (if you have MyChart) OR . A paper copy in the mail If you have any lab test that is abnormal or we need to change your treatment, we will call you to review the results.   Testing/Procedures: No new testing needed   Follow-Up: At [facility], you and your health needs are our priority.  As part of our continuing mission to provide you with exceptional heart care, we have created designated Provider Care Teams.  These Care Teams include your primary Cardiologist (physician) and Advanced Practice Providers (APPs -  Physician Assistants and Nurse Practitioners) who all work together to provide you with the care you need, when you need it.  . You will need a follow up appointment in 6 months (April 2021)  .   Please call our office 2 months in advance to schedule this appointment.  (Call in early February 2021 to schedule)  . Providers on your designated Care Team:   . Murray Hodgkins, NP . Christell Faith, PA-C . Marrianne Mood, PA-C  Any Other Special Instructions Will Be Listed Below (If Applicable).  For educational health videos Log in to : [email or web address] Or : [email or web address], password : [email or web address]

## 2019-09-01 DIAGNOSIS — E1122 Type 2 diabetes mellitus with diabetic chronic kidney disease: Secondary | ICD-10-CM | POA: Diagnosis not present

## 2019-09-01 DIAGNOSIS — E1140 Type 2 diabetes mellitus with diabetic neuropathy, unspecified: Secondary | ICD-10-CM | POA: Diagnosis not present

## 2019-09-01 DIAGNOSIS — I255 Ischemic cardiomyopathy: Secondary | ICD-10-CM | POA: Diagnosis not present

## 2019-09-01 DIAGNOSIS — N184 Chronic kidney disease, stage 4 (severe): Secondary | ICD-10-CM | POA: Diagnosis not present

## 2019-09-01 DIAGNOSIS — D631 Anemia in chronic kidney disease: Secondary | ICD-10-CM | POA: Diagnosis not present

## 2019-09-01 DIAGNOSIS — I5023 Acute on chronic systolic (congestive) heart failure: Secondary | ICD-10-CM | POA: Diagnosis not present

## 2019-09-01 DIAGNOSIS — I130 Hypertensive heart and chronic kidney disease with heart failure and stage 1 through stage 4 chronic kidney disease, or unspecified chronic kidney disease: Secondary | ICD-10-CM | POA: Diagnosis not present

## 2019-09-01 DIAGNOSIS — I2510 Atherosclerotic heart disease of native coronary artery without angina pectoris: Secondary | ICD-10-CM | POA: Diagnosis not present

## 2019-09-01 DIAGNOSIS — E1151 Type 2 diabetes mellitus with diabetic peripheral angiopathy without gangrene: Secondary | ICD-10-CM | POA: Diagnosis not present

## 2019-09-01 NOTE — Progress Notes (Signed)
I connected with [name] [name] on 09/01/19 at  2:45 PM EDT by video enabled telemedicine visit and verified that I am speaking with the correct person using two identifiers.   I discussed the limitations, risks, security and privacy concerns of performing an evaluation and management service by telemedicine and the availability of in-person appointments. I also discussed with the patient that there may be a patient responsible charge related to this service. The patient expressed understanding and agreed to proceed.  Other persons participating in the visit and their role in the encounter:  none  Patient's location:  home Provider's location:  Work  Diagnosis: Anemia of chronic kidney disease  Chief Complaint: Discuss results of blood work  History of present illness: Patient is a 58 year old male with a past medical history significant for type 2 diabetes, heart failure and CAD among other medical problems.  He was recently discharged from the hospital 2 days ago on 08/18/2019 after he presented with shortness of breath and syncope.  He has an EF of 30 to 35% and will need ICD as an outpatient.  He also has stage IV CKD and follows up with nephrology.  Patient has had chronic anemia dating back to 2015.  His most recent CBC from 08/16/2019 showed a white count of 8.4, H&H of 7.7/25.1 with a platelet count of 288.Anemia work-up showed ferritin of 179 a month ago.  Iron studies were consistent with anemia of chronic disease given that his TIBC was low at 163 and iron saturation low at 13%.  Results of blood work from 08/21/2019 were as follows: CBC showed white count of 7.8, H&H of 8.7/27.7 and a platelet count of 252.  CMP was significant for elevated creatinine of 4.2.  Folate was normal at 20.6.  B12 levels elevated at 1681.  Ferritin was normal at 253.  Iron study showed a low TIBC of 231 reticulocyte count was mildly elevated at 4.9 but appropriate for the degree of anemia.  Haptoglobin was normal at  77.  Myeloma panel showed no M protein TSH was normal.  Interval history: Patient reports feeling significantly better since his hospital discharge.  Also reports that his blood work was checked with nephrology and his hemoglobin had further improved to 9.1.  He still has some ongoing fatigue but denies other complaints at this time   Review of Systems  Constitutional: Positive for malaise/fatigue. Negative for chills, fever and weight loss.  HENT: Negative for congestion, ear discharge and nosebleeds.   Eyes: Negative for blurred vision.  Respiratory: Negative for cough, hemoptysis, sputum production, shortness of breath and wheezing.   Cardiovascular: Negative for chest pain, palpitations, orthopnea and claudication.  Gastrointestinal: Negative for abdominal pain, blood in stool, constipation, diarrhea, heartburn, melena, nausea and vomiting.  Genitourinary: Negative for dysuria, flank pain, frequency, hematuria and urgency.  Musculoskeletal: Negative for back pain, joint pain and myalgias.  Skin: Negative for rash.  Neurological: Negative for dizziness, tingling, focal weakness, seizures, weakness and headaches.  Endo/Heme/Allergies: Does not bruise/bleed easily.  Psychiatric/Behavioral: Negative for depression and suicidal ideas. The patient does not have insomnia.     Allergies  Allergen Reactions  . 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 Doesn't believe he has an allergy to PCN as he has taken it previously, according to wife.  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 Doesn't believe he has an allergy to PCN as he has  taken it previously, according to wife.    Past Medical History:  Diagnosis Date  . CAD (coronary artery disease)    a. 04/2014 CABG x 4 (LIMA->LAD, VG->Diag, VG->OM, VG->PDA).  . Chronic systolic CHF (congestive heart  failure) ([place])    a. 07/2014 Echo: EF 30-35%.  . CKD (chronic kidney disease), stage III    a. 12/2015 Creat 1.8.  . Diabetic foot ulcers ([place])    a. left foot 2nd digit ant 5 th digit 05/03/14; b. 08/2014 s/p amputation of toes on L foot.  . Hypercholesterolemia    a. 12/2015 TC 116, TG 154, HDL 24, LDL 61-->Atorvastatin 40.  Marland Kitchen Hypertension   . Hypertensive heart disease   . Ischemic cardiomyopathy    a. 07/2013 Echo: EF 20%; b. 07/2014 Echo: EF 30-35%, diff HK, Gr1 DD, mildly dil LA, nl PASP.  Marland Kitchen Myocardial infarction ([place])   . Neuropathy in diabetes ([place])   . Open wound    foot  . Orthostatic hypotension   . Peripheral vascular disease ([place])   . Type II diabetes mellitus ([place])    a. 12/2015 HbA1c = 13.9.    Past Surgical History:  Procedure Laterality Date  . ACHILLES TENDON SURGERY Right 01/22/2017   Procedure: ACHILLES LENGTHENING/KIDNER/TAL/Teno achilles lengthening;  Surgeon: Samara Deist, DPM;  Location: [facility];  Service: Podiatry;  Laterality: Right;  . CARDIAC CATHETERIZATION     03/2014  . CATARACT EXTRACTION    . CORONARY ARTERY BYPASS GRAFT N/A 05/07/2014   Procedure: CORONARY ARTERY BYPASS GRAFTING (CABG);  Surgeon: Gaye Pollack, MD;  Location: [place];  Service: Open Heart Surgery;  Laterality: N/A;  Times 4 using left internal mammary artery and endoscopically harvested right saphenous vein  . DIALYSIS/PERMA CATHETER INSERTION N/A 08/04/2019   Procedure: DIALYSIS/PERMA CATHETER INSERTION;  Surgeon: Katha Cabal, MD;  Location: [place] [facility];  Service: Cardiovascular;  Laterality: N/A;  . DIALYSIS/PERMA CATHETER REMOVAL N/A 08/08/2019   Procedure: DIALYSIS/PERMA CATHETER REMOVAL;  Surgeon: Katha Cabal, MD;  Location: [place] [facility];  Service: Cardiovascular;  Laterality: N/A;  . INTRAOPERATIVE TRANSESOPHAGEAL ECHOCARDIOGRAM N/A 05/07/2014   Procedure: INTRAOPERATIVE TRANSESOPHAGEAL ECHOCARDIOGRAM;  Surgeon: Gaye Pollack, MD;  Location: [facility];   Service: Open Heart Surgery;  Laterality: N/A;  . left foot osteomyelitis and wound after surgery    . TOE AMPUTATION     Left 3 and 4 toes  . TONSILECTOMY/ADENOIDECTOMY WITH MYRINGOTOMY      Social History   Socioeconomic History  . Marital status: Married    Spouse name: Not on file  . Number of children: Not on file  . Years of education: Not on file  . Highest education level: Not on file  Occupational History  . Not on file  Social Needs  . Financial resource strain: Not on file  . Food insecurity    Worry: Not on file    Inability: Not on file  . Transportation needs    Medical: Not on file    Non-medical: Not on file  Tobacco Use  . Smoking status: Never Smoker  . Smokeless tobacco: Never Used  Substance and Sexual Activity  . Alcohol use: No  . Drug use: No  . Sexual activity: Not on file  Lifestyle  . Physical activity    Days per week: Not on file    Minutes per session: Not on file  . Stress: Not on file  Relationships  . Social connections    Talks  on phone: Not on file    Gets together: Not on file    Attends religious service: Not on file    Active member of club or organization: Not on file    Attends meetings of clubs or organizations: Not on file    Relationship status: Not on file  . Intimate partner violence    Fear of current or ex partner: Not on file    Emotionally abused: Not on file    Physically abused: Not on file    Forced sexual activity: Not on file  Other Topics Concern  . Not on file  Social History Narrative  . Not on file    Family History  Problem Relation Age of Onset  . Heart disease Father        CABG in his 71's  . Diabetes type II Other   . Kidney disease Other   . Hypertension Other   . Ovarian cancer Mother      Current Outpatient Medications:  .  aspirin EC 81 MG tablet, Take 81 mg by mouth daily., Disp: , Rfl:  .  atorvastatin (LIPITOR) 40 MG tablet, Take 1 tablet (40 mg total) by mouth at bedtime., Disp:  90 tablet, Rfl: 3 .  carvedilol (COREG) 25 MG tablet, Take 1 tablet (25 mg total) by mouth 2 (two) times daily with a meal. (Patient taking differently: Take 25 mg by mouth at bedtime. ), Disp: 180 tablet, Rfl: 3 .  Cyanocobalamin (VITAMIN B 12 PO), Take 1 tablet by mouth at bedtime. , Disp: , Rfl:  .  famotidine (PEPCID) 20 MG tablet, TAKE 1 TABLET BY MOUTH EVERY DAY (Patient taking differently: Take 20 mg by mouth at bedtime. ), Disp: 90 tablet, Rfl: 0 .  ferrous sulfate 325 (65 FE) MG tablet, Take 325 mg by mouth daily., Disp: , Rfl:  .  glimepiride (AMARYL) 4 MG tablet, Take 1 tablet by mouth 2 (two) times daily., Disp: , Rfl:  .  hydrALAZINE (APRESOLINE) 50 MG tablet, Take 1 tablet (50 mg total) by mouth 3 (three) times daily., Disp: 90 tablet, Rfl: 0 .  insulin aspart (NOVOLOG) 100 UNIT/ML injection, Inject 0-9 Units into the skin 3 (three) times daily with meals., Disp: 10 mL, Rfl: 11 .  insulin detemir (LEVEMIR) 100 UNIT/ML injection, Inject 0.15 mLs (15 Units total) into the skin daily. (Patient taking differently: Inject 20 Units into the skin daily. ), Disp: 10 mL, Rfl: 11 .  isosorbide mononitrate (IMDUR) 30 MG 24 hr tablet, Take 1 tablet (30 mg total) by mouth 2 (two) times daily., Disp: 60 tablet, Rfl: 0 .  multivitamin (RENA-VIT) TABS tablet, Take 1 tablet by mouth at bedtime., Disp: 30 tablet, Rfl: 0 .  nitroGLYCERIN (NITROSTAT) 0.4 MG SL tablet, Place 1 tablet (0.4 mg total) under the tongue every 5 (five) minutes as needed for chest pain., Disp: 30 tablet, Rfl: 1 .  sodium bicarbonate 650 MG tablet, Take 1,300 mg by mouth 2 (two) times daily. , Disp: , Rfl:  .  torsemide (DEMADEX) 20 MG tablet, Take 2 tablets (40 mg total) by mouth daily., Disp: 30 tablet, Rfl: 1  Dg Chest 2 View  Result Date: 08/14/2019 CLINICAL DATA:  Shortness of breath.  Renal failure EXAM: CHEST - 2 VIEW COMPARISON:  July 27, 2019 FINDINGS: There is no edema or consolidation. There is slight cardiomegaly  with pulmonary vascularity within normal limits. No adenopathy. Patient is status post coronary artery bypass grafting. No bone lesions. IMPRESSION:  Mild cardiomegaly. Status post coronary artery bypass grafting. No evident edema or consolidation. Electronically Signed   By: Lowella Grip III M.D.   On: 08/14/2019 17:10   Nm Pulmonary Perf And Vent  Result Date: 08/15/2019 CLINICAL DATA:  Shortness of breath EXAM: NUCLEAR MEDICINE VENTILATION - PERFUSION LUNG SCAN VIEWS: Anterior, posterior, left lateral, right lateral, RPO, LPO, Tamera Pingley, LAO-ventilation and perfusion. RADIOPHARMACEUTICALS:  32.743 mCi of Tc-55mDTPA aerosol inhalation and 4.134 mCi Tc92mAA IV COMPARISON:  Chest radiograph August 14, 2019 FINDINGS: Ventilation: Radiotracer uptake is homogeneous and symmetric bilaterally. No ventilation defects evident. Perfusion: Radiotracer uptake is homogeneous and symmetric bilaterally. No perfusion defects evident. Heart is noted to be prominent. IMPRESSION: No appreciable ventilation or perfusion defects. Very low probability of pulmonary embolus. Prominent cardiac silhouette. Electronically Signed   By: WiLowella GripII M.D.   On: 08/15/2019 13:44    No images are attached to the encounter.   CMP Latest Ref Rng & Units 08/21/2019  Glucose 70 - 99 mg/dL 283(H)  BUN 6 - 20 mg/dL 61(H)  Creatinine 0.61 - 1.24 mg/dL 4.29(H)  Sodium 135 - 145 mmol/L 140  Potassium 3.5 - 5.1 mmol/L 4.4  Chloride 98 - 111 mmol/L 101  CO2 22 - 32 mmol/L 27  Calcium 8.9 - 10.3 mg/dL 8.0(L)  Total Protein 6.5 - 8.1 g/dL 7.6  Total Bilirubin 0.3 - 1.2 mg/dL 0.7  Alkaline Phos 38 - 126 U/L 55  AST 15 - 41 U/L 25  ALT 0 - 44 U/L 24   CBC Latest Ref Rng & Units 08/21/2019  WBC 4.0 - 10.5 K/uL 7.8  Hemoglobin 13.0 - 17.0 g/dL 8.7(L)  Hematocrit 39.0 - 52.0 % 27.7(L)  Platelets 150 - 400 K/uL 252     Observation/objective: Appears in no acute distress of a video visit today.  Breathing is  nonlabored  Assessment and plan: Patient is a [age]ear old male referred for normocytic anemia likely secondary to anemia of chronic kidney disease  I discussed the results of the blood work with the patient in detail which did not reveal any reversible cause of anemia.  He has a normal iron studies, B12, folate, TSH.  Multiple myeloma panel shows no M protein and there is no evidence of hemolysis.  Suspect all his anemia secondary to anemia of chronic kidney disease.  We have discussed the role of EPO in the situation to keep his hemoglobin between 10-11.  EPO is associated with possible side effect such as heart attacks and strokes and thromboembolic phenomena.  Patient does have cardiovascular comorbidities.  His hemoglobin seems to be slowly improving since his hospital discharge.  We have therefore agreed to watch his hemoglobin closely without initiating EPO at this time.  Follow-up instructions: I will see him back in 2 months time with labs  I discussed the assessment and treatment plan with the patient. The patient was provided an opportunity to ask questions and all were answered. The patient agreed with the plan and demonstrated an understanding of the instructions.   The patient was advised to call back or seek an in-person evaluation if the symptoms worsen or if the condition fails to improve as anticipated.    Visit Diagnosis: No diagnosis found.  Dr. ArRanda EvensMD, MPH [facility] AlBapt[facility] Rest[facility] Hospitalager- [phone]/16/2020 7:49 AM

## 2019-09-04 ENCOUNTER — Telehealth: Payer: Self-pay | Admitting: Cardiovascular Disease

## 2019-09-04 DIAGNOSIS — I2510 Atherosclerotic heart disease of native coronary artery without angina pectoris: Secondary | ICD-10-CM

## 2019-09-04 NOTE — Telephone Encounter (Signed)
Pt c/o BP issue: STAT if pt c/o blurred vision, one-sided weakness or slurred speech  1. What are your last 5 BP readings? This am -140/80. 3-4 hours later sitting down 117/65, standing 69/38 Yesterday- 130/68 am sitting, 12:30 sitting 110/66, standing 70/39, 6:30 siting 117/67 Saturday - am sitting 137/77, standing 82/46, after eating crackers 106/64.   2. Are you having any other symptoms (ex. Dizziness, headache, blurred vision, passed out)?  Dizziness  3. What is your BP issue? BP drops when he gets up, this has been going on since Saturday.  Yesterday he took 1/2 Hydralazine and it did not change anything.

## 2019-09-04 NOTE — Telephone Encounter (Signed)
Call to patient.   He reports dizziness with standing since the weekend. Took orthostatic blood pressure and numbers were low with standing.   This am -140/80. 3-4 hours later sitting down 117/65, standing 69/38 Yesterday- 130/68 am sitting, 12:30 sitting 110/66, standing 70/39, 6:30 siting 117/67 Saturday - am sitting 137/77, standing 82/46, after eating crackers 106/64.   He has been taking hydralazine 0.5 tab (12.5 mg total) TID since Saturday and does not seen to see a difference. He denies chest pain or SOB.   Weight today 240.2 lb, 240 at last OV with Dr. Rockey Situ.   Pt has appt with Dr. Caryl Comes tomorrow at 60. He will get labs prior, last CR 4.29.  Routing to Dr. Rockey Situ to make sure there is nothing we should do prior to appt.

## 2019-09-05 ENCOUNTER — Other Ambulatory Visit: Payer: Self-pay

## 2019-09-05 ENCOUNTER — Ambulatory Visit (INDEPENDENT_AMBULATORY_CARE_PROVIDER_SITE_OTHER): Payer: Medicare HMO | Admitting: Internal Medicine

## 2019-09-05 ENCOUNTER — Telehealth: Payer: Self-pay

## 2019-09-05 ENCOUNTER — Encounter: Payer: Self-pay | Admitting: *Deleted

## 2019-09-05 ENCOUNTER — Other Ambulatory Visit
Admission: RE | Admit: 2019-09-05 | Discharge: 2019-09-05 | Disposition: A | Discharge status: Home / Self Care | Payer: Medicare HMO | Source: Ambulatory Visit | Attending: Cardiovascular Disease | Admitting: Cardiovascular Disease

## 2019-09-05 ENCOUNTER — Encounter: Payer: Self-pay | Admitting: Internal Medicine

## 2019-09-05 DIAGNOSIS — I2510 Atherosclerotic heart disease of native coronary artery without angina pectoris: Secondary | ICD-10-CM

## 2019-09-05 DIAGNOSIS — I255 Ischemic cardiomyopathy: Secondary | ICD-10-CM | POA: Diagnosis not present

## 2019-09-05 DIAGNOSIS — Z01812 Encounter for preprocedural laboratory examination: Secondary | ICD-10-CM

## 2019-09-05 DIAGNOSIS — I472 Ventricular tachycardia: Secondary | ICD-10-CM

## 2019-09-05 DIAGNOSIS — E1140 Type 2 diabetes mellitus with diabetic neuropathy, unspecified: Secondary | ICD-10-CM | POA: Diagnosis not present

## 2019-09-05 DIAGNOSIS — D631 Anemia in chronic kidney disease: Secondary | ICD-10-CM | POA: Diagnosis not present

## 2019-09-05 DIAGNOSIS — E1122 Type 2 diabetes mellitus with diabetic chronic kidney disease: Secondary | ICD-10-CM | POA: Diagnosis not present

## 2019-09-05 DIAGNOSIS — E1151 Type 2 diabetes mellitus with diabetic peripheral angiopathy without gangrene: Secondary | ICD-10-CM | POA: Diagnosis not present

## 2019-09-05 DIAGNOSIS — I5022 Chronic systolic (congestive) heart failure: Secondary | ICD-10-CM

## 2019-09-05 DIAGNOSIS — I4729 Other ventricular tachycardia: Secondary | ICD-10-CM

## 2019-09-05 DIAGNOSIS — I5023 Acute on chronic systolic (congestive) heart failure: Secondary | ICD-10-CM | POA: Diagnosis not present

## 2019-09-05 DIAGNOSIS — N184 Chronic kidney disease, stage 4 (severe): Secondary | ICD-10-CM | POA: Diagnosis not present

## 2019-09-05 DIAGNOSIS — I130 Hypertensive heart and chronic kidney disease with heart failure and stage 1 through stage 4 chronic kidney disease, or unspecified chronic kidney disease: Secondary | ICD-10-CM | POA: Diagnosis not present

## 2019-09-05 LAB — CBC
HCT: 34.4 % — ABNORMAL LOW (ref 39.0–52.0)
Hemoglobin: 11.1 g/dL — ABNORMAL LOW (ref 13.0–17.0)
MCH: 30.5 pg (ref 26.0–34.0)
MCHC: 32.3 g/dL (ref 30.0–36.0)
MCV: 94.5 fL (ref 80.0–100.0)
Platelets: 160 10*3/uL (ref 150–400)
RBC: 3.64 MIL/uL — ABNORMAL LOW (ref 4.22–5.81)
RDW: 14.5 % (ref 11.5–15.5)
WBC: 7.3 10*3/uL (ref 4.0–10.5)
nRBC: 0 % (ref 0.0–0.2)

## 2019-09-05 LAB — BASIC METABOLIC PANEL
Anion gap: 12 (ref 5–15)
BUN: 80 mg/dL — ABNORMAL HIGH (ref 6–20)
CO2: 27 mmol/L (ref 22–32)
Calcium: 9.1 mg/dL (ref 8.9–10.3)
Chloride: 97 mmol/L — ABNORMAL LOW (ref 98–111)
Creatinine, Ser: 3.74 mg/dL — ABNORMAL HIGH (ref 0.61–1.24)
GFR calc Af Amer: 20 mL/min — ABNORMAL LOW (ref >60)
GFR calc non Af Amer: 17 mL/min — ABNORMAL LOW (ref >60)
Glucose, Bld: 313 mg/dL — ABNORMAL HIGH (ref 70–99)
Potassium: 5 mmol/L (ref 3.5–5.1)
Sodium: 136 mmol/L (ref 135–145)

## 2019-09-05 MED ORDER — CARVEDILOL 6.25 MG PO TABS: ORAL_TABLET | ORAL | 6 refills | Status: DC

## 2019-09-05 MED ORDER — HYDRALAZINE HCL 25 MG PO TABS: ORAL_TABLET | ORAL | Status: DC

## 2019-09-05 MED ORDER — ISOSORBIDE MONONITRATE ER 30 MG PO TB24: ORAL_TABLET | ORAL | Status: DC

## 2019-09-05 NOTE — Telephone Encounter (Signed)
[facility] and wants to have bmp   Can he do this now vs in office

## 2019-09-05 NOTE — Telephone Encounter (Signed)
Can we update his medication list in the chart Would verify his carvedilol dosing which we decreased previously He can hold the hydralazine  I think he is taking Coreg 3.125 twice daily with Imdur and Hydralazine which we will hold Would recommend he hold torsemide today  BMP tomorrow when he sees Dr. Caryl Comes

## 2019-09-05 NOTE — Telephone Encounter (Signed)
Patient had a BMP/ CBC in [place] this morning.

## 2019-09-05 NOTE — H&P (View-Only) (Signed)
Patient Care Team: Tracie Harrier, MD as PCP - General (Internal Medicine) Minna Merritts, MD as PCP - Cardiology (Cardiology) Deboraha Sprang, MD as PCP - Electrophysiology (Cardiology) Alisa Graff, FNP as Nurse Practitioner (Family Medicine)   HPI  [name] [name] is a 58 y.o. male seen early in March prior to Covid for consideration of an ICD for primary prevention for ICM - prior CABG/CHF .  With his inclination to defer a decision and hope that his heart muscle function will continue to improve.  History of MRSA infection complicated by shock and renal failure.  Struggling with profound orhtostatic hypotension, needs a walker at home but often not able to make it 20-30 ft  Admitted 9/20 with syncope, occurring while walking across the house  Tel VT NS and discharged with LIFEVEST  Had been admitted earlier 9/20 with volume overload and had been undergoing vigorous diuresis w significant weight loss  Has CKD    DATE TEST EF   5/15 LHC  40%  3V CAD>> CABG  7/18 Echo   40-45 %   11/19 Echo   30-35 %   11/19 MYOVIEW 36 Inferior Scar Min ischemia  8/20 Echo  30-*35%     Date Cr K Hgb  12/19 2.92 5.1 11.2  2/20  2.9 5.0 11.4  10/20 3.74 5.0 11.1      Records and Results Reviewed   Past Medical History:  Diagnosis Date  . CAD (coronary artery disease)    a. 04/2014 CABG x 4 (LIMA->LAD, VG->Diag, VG->OM, VG->PDA).  . Chronic systolic CHF (congestive heart failure) ([place])    a. 07/2014 Echo: EF 30-35%.  . CKD (chronic kidney disease), stage III    a. 12/2015 Creat 1.8.  . Diabetic foot ulcers ([place])    a. left foot 2nd digit ant 5 th digit 05/03/14; b. 08/2014 s/p amputation of toes on L foot.  . Hypercholesterolemia    a. 12/2015 TC 116, TG 154, HDL 24, LDL 61-->Atorvastatin 40.  Marland Kitchen Hypertension   . Hypertensive heart disease   . Ischemic cardiomyopathy    a. 07/2013 Echo: EF 20%; b. 07/2014 Echo: EF 30-35%, diff HK, Gr1 DD, mildly dil LA, nl  PASP.  Marland Kitchen Myocardial infarction ([place])   . Neuropathy in diabetes ([place])   . Open wound    foot  . Orthostatic hypotension   . Peripheral vascular disease ([place])   . Type II diabetes mellitus ([place])    a. 12/2015 HbA1c = 13.9.    Past Surgical History:  Procedure Laterality Date  . ACHILLES TENDON SURGERY Right 01/22/2017   Procedure: ACHILLES LENGTHENING/KIDNER/TAL/Teno achilles lengthening;  Surgeon: Samara Deist, DPM;  Location: [facility];  Service: Podiatry;  Laterality: Right;  . CARDIAC CATHETERIZATION     03/2014  . CATARACT EXTRACTION    . CORONARY ARTERY BYPASS GRAFT N/A 05/07/2014   Procedure: CORONARY ARTERY BYPASS GRAFTING (CABG);  Surgeon: Gaye Pollack, MD;  Location: [facility];  Service: Open Heart Surgery;  Laterality: N/A;  Times 4 using left internal mammary artery and endoscopically harvested right saphenous vein  . DIALYSIS/PERMA CATHETER INSERTION N/A 08/04/2019   Procedure: DIALYSIS/PERMA CATHETER INSERTION;  Surgeon: Katha Cabal, MD;  Location: [facility] [facility];  Service: Cardiovascular;  Laterality: N/A;  . DIALYSIS/PERMA CATHETER REMOVAL N/A 08/08/2019   Procedure: DIALYSIS/PERMA CATHETER REMOVAL;  Surgeon: Katha Cabal, MD;  Location: [facility];  Service: Cardiovascular;  Laterality: N/A;  . INTRAOPERATIVE  TRANSESOPHAGEAL ECHOCARDIOGRAM N/A 05/07/2014   Procedure: INTRAOPERATIVE TRANSESOPHAGEAL ECHOCARDIOGRAM;  Surgeon: Gaye Pollack, MD;  [facility];  Service: Open Heart Surgery;  Laterality: N/A;  . left foot osteomyelitis and wound after surgery    . TOE AMPUTATION     Left 3 and 4 toes  . TONSILECTOMY/ADENOIDECTOMY WITH MYRINGOTOMY      Current Meds  Medication Sig  . aspirin EC 81 MG tablet Take 81 mg by mouth daily.  Marland Kitchen atorvastatin (LIPITOR) 40 MG tablet Take 1 tablet (40 mg total) by mouth at bedtime.  . carvedilol (COREG) 25 MG tablet Take 1 tablet (25 mg total) by mouth 2 (two) times daily with a meal. (Patient taking  differently: Take 25 mg by mouth at bedtime. )  . Cyanocobalamin (VITAMIN B 12 PO) Take 1 tablet by mouth at bedtime.   . ferrous sulfate 325 (65 FE) MG tablet Take 325 mg by mouth daily.  . hydrALAZINE (APRESOLINE) 50 MG tablet Take 1 tablet (50 mg total) by mouth 3 (three) times daily. (Patient taking differently: Take 25 mg by mouth 3 (three) times daily. )  . insulin aspart (NOVOLOG) 100 UNIT/ML injection Inject 0-9 Units into the skin 3 (three) times daily with meals.  . insulin detemir (LEVEMIR) 100 UNIT/ML injection Inject 0.15 mLs (15 Units total) into the skin daily. (Patient taking differently: Inject 20 Units into the skin daily. )  . isosorbide mononitrate (IMDUR) 30 MG 24 hr tablet Take 1 tablet (30 mg total) by mouth 2 (two) times daily.  . multivitamin (RENA-VIT) TABS tablet Take 1 tablet by mouth at bedtime.  . nitroGLYCERIN (NITROSTAT) 0.4 MG SL tablet Place 1 tablet (0.4 mg total) under the tongue every 5 (five) minutes as needed for chest pain.  . sodium bicarbonate 650 MG tablet Take 1,300 mg by mouth 2 (two) times daily.   Marland Kitchen torsemide (DEMADEX) 20 MG tablet Take 2 tablets (40 mg total) by mouth daily.    Allergies  Allergen Reactions  . 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 Doesn't believe he has an allergy to PCN as he has taken it previously, according to wife.  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 Doesn't believe he has an allergy to PCN as he has taken it previously, according to wife.      Review of Systems negative except from HPI and PMH  Physical Exam BP 133/79 (BP Location: Right Arm, Patient Position: Sitting, Cuff Size: Large)   Pulse 69   Ht 6\' 1"  (1.854 m)   Wt 248 lb 8 oz (112.7 kg)   SpO2 97%   BMI 32.79 kg/m  Well developed and well nourished in no acute distress HENT normal E scleral and icterus  clear Neck Supple JVP flat; carotids brisk and full Clear to ausculation LifeVest in place Regular rate and rhythm, no murmurs gallops or rub Soft with active bowel sounds No clubbing cyanosis Trace Edema Alert and oriented, grossly normal motor and sensory function Skin Warm and Dry    Assessment and  Plan Ischemic cardiomyopathy  Congestive heart failure-chronic-systolic-class IIb  Ventricular tachycardia-nonsustained  Renal insufficiency grade 4  Syncope   Orthostatic Hypotension   The patient has persistent left ventricular dysfunction which despite guideline directed maximally tolerated therapy remains in the 30-35% range.  We discussed the role of an ICD for primary prevention in the context of impending dialysis.  Given  the borderline ejection fraction, it is not so clear that an ICD is appropriate for primary prevention.  However, the syncopal episode is very concerning.  It may be related to his orthostasis as it occurred after standing or walking across the room, however, it was very abrupt in onset and relatively brief.  It is hard to know whether it is arrhythmic or not.  Moreover, he has had prolonged nonsustained ventricular tachycardia which has been shown to be a marker of increased risk in ischemic cardiomyopathy.  Hence, we have elected to pursue ICD implantation for combination of primary and secondary prevention realizing that there are potential risks associated with endovascular placement in the context of future dialysis.  Apparently nephrology has considered that he might be a candidate for peritoneal dialysis  Have reviewed the potential benefits and risks of ICD implantation including but not limited to death, perforation of heart or lung, lead dislodgement, infection,  device malfunction and inappropriate shocks.  The patient and family express understanding  and are willing to proceed.   His wife was engaged over the telephone  More than 50% of 40 min  was spent in counseling related to the above   Current medicines are reviewed at length with the patient today .  The patient does not have concerns regarding medicines.

## 2019-09-05 NOTE — Patient Instructions (Addendum)
Medication Instructions:  - Your physician has recommended you make the following change in your medication:   1) Decrease coreg (carvedilol) to 6.25 mg- take 1 tablet by mouth TWICE daily  2) Decrease hydralazine to 25 mg- take 1 tablet by mouth TWICE daily  3) Decrease imdur (isosorbide) 30 mg- take 1 tablet by mouth ONCE daily  *If you need a refill on your cardiac medications before your next appointment, please call your pharmacy*  Lab Work: - Pre procedure labs: Friday 09/29/2019 (12:00 pm- 2:00 pm) - [place] at [facility], 1st desk on the right to check in at registration   - Pre procedure COVID swab: Friday 09/29/19 (12:30 pm- 2:30 pm) - [facility], this is a drive up test- they will come out to the car to swab you   If you have labs (blood work) drawn today and your tests are completely normal, you will receive your results only by: Marland Kitchen MyChart Message (if you have MyChart) OR . A paper copy in the mail If you have any lab test that is abnormal or we need to change your treatment, we will call you to review the results.  Testing/Procedures: - Your physician has recommended that you have a defibrillator inserted. An implantable cardioverter defibrillator (ICD) is a small device that is placed in your chest or, in rare cases, your abdomen. This device uses electrical pulses or shocks to help control life-threatening, irregular heartbeats that could lead the heart to suddenly stop beating (sudden cardiac arrest). Leads are attached to the ICD that goes into your heart. This is done in the hospital and usually requires an overnight stay. Please see the instruction sheet given to you today for more information.   Follow-Up: At [facility], you and your health needs are our priority.  As part of our continuing mission to provide you with exceptional heart care, we have created designated Provider Care Teams.  These Care Teams include your primary Cardiologist  (physician) and Advanced Practice Providers (APPs -  Physician Assistants and Nurse Practitioners) who all work together to provide you with the care you need, when you need it.  Your next appointment:   1) 10-14 days (from 10/04/19) with the [facility] in [place] for a wound check  2) 91 days (from 10/04/19 with Dr. Caryl Comes  The format for your next appointment:   In Person for both  Provider:   As above  Other Instructions - N/A    Cardioverter Defibrillator Implantation  An implantable cardioverter defibrillator (ICD) is a small device that is placed under the skin in the chest or abdomen. An ICD consists of a battery, a small computer (pulse generator), and wires (leads) that go into the heart. An ICD is used to detect and correct two types of dangerous irregular heartbeats (arrhythmias):  A rapid heart rhythm (tachycardia).  An arrhythmia in which the lower chambers of the heart (ventricles) contract in an uncoordinated way (fibrillation). When an ICD detects tachycardia, it sends a low-energy shock to the heart to restore the heartbeat to normal (cardioversion). This signal is usually painless. If cardioversion does not work or if the ICD detects fibrillation, it delivers a high-energy shock to the heart (defibrillation) to restart the heart. This shock may feel like a strong jolt in the chest. Your health care provider may prescribe an ICD if:  You have had an arrhythmia that originated in the ventricles.  Your heart has been damaged by a disease or heart  condition. Sometimes, ICDs are programmed to act as a device called a pacemaker. Pacemakers can be used to treat a slow heartbeat (bradycardia) or tachycardia by taking over the heart rate with electrical impulses. Tell a health care provider about:  Any allergies you have.  All medicines you are taking, including vitamins, herbs, eye drops, creams, and over-the-counter medicines.  Any problems you or family members  have had with anesthetic medicines.  Any blood disorders you have.  Any surgeries you have had.  Any medical conditions you have.  Whether you are pregnant or may be pregnant. What are the risks? Generally, this is a safe procedure. However, problems may occur, including:  Swelling, bleeding, or bruising.  Infection.  Blood clots.  Damage to other structures or organs, such as nerves, blood vessels, or the heart.  Allergic reactions to medicines used during the procedure. What happens before the procedure? Staying hydrated Follow instructions from your health care provider about hydration, which may include:  Up to 2 hours before the procedure - you may continue to drink clear liquids, such as water, clear fruit juice, black coffee, and plain tea. Eating and drinking restrictions Follow instructions from your health care provider about eating and drinking, which may include:  8 hours before the procedure - stop eating heavy meals or foods such as meat, fried foods, or fatty foods.  6 hours before the procedure - stop eating light meals or foods, such as toast or cereal.  6 hours before the procedure - stop drinking milk or drinks that contain milk.  2 hours before the procedure - stop drinking clear liquids. Medicine Ask your health care provider about:  Changing or stopping your normal medicines. This is important if you take diabetes medicines or blood thinners.  Taking medicines such as aspirin and ibuprofen. These medicines can thin your blood. Do not take these medicines before your procedure if your doctor tells you not to. Tests  You may have blood tests.  You may have a test to check the electrical signals in your heart (electrocardiogram, ECG).  You may have imaging tests, such as a chest X-ray. General instructions  For 24 hours before the procedure, stop using products that contain nicotine or tobacco, such as cigarettes and e-cigarettes. If you need help  quitting, ask your health care provider.  Plan to have someone take you home from the hospital or clinic.  You may be asked to shower with a germ-killing soap. What happens during the procedure?  To reduce your risk of infection: ? Your health care team will wash or sanitize their hands. ? Your skin will be washed with soap. ? Hair may be removed from the surgical area.  Small monitors will be put on your body. They will be used to check your heart, blood pressure, and oxygen level.  An IV tube will be inserted into one of your veins.  You will be given one or more of the following: ? A medicine to help you relax (sedative). ? A medicine to numb the area (local anesthetic). ? A medicine to make you fall asleep (general anesthetic).  Leads will be guided through a blood vessel into your heart and attached to your heart muscles. Depending on the ICD, the leads may go into one ventricle or they may go into both ventricles and into an upper chamber of the heart. An X-ray machine (fluoroscope) will be usedto help guide the leads.  A small incision will be made to create a  deep pocket under your skin.  The pulse generator will be placed into the pocket.  The ICD will be tested.  The incision will be closed with stitches (sutures), skin glue, or staples.  A bandage (dressing) will be placed over the incision. This procedure may vary among health care providers and hospitals. What happens after the procedure?  Your blood pressure, heart rate, breathing rate, and blood oxygen level will be monitored often until the medicines you were given have worn off.  A chest X-ray will be taken to check that the ICD is in the right place.  You will need to stay in the hospital for 1-2 days so your health care provider can make sure your ICD is working.  Do not drive for 24 hours if you received a sedative. Ask your health care provider when it is safe for you to drive.  You may be given an  identification card explaining that you have an ICD. Summary  An implantable cardioverter defibrillator (ICD) is a small device that is placed under the skin in the chest or abdomen. It is used to detect and correct dangerous irregular heartbeats (arrhythmias).  An ICD consists of a battery, a small computer (pulse generator), and wires (leads) that go into the heart.  When an ICD detects rapid heart rhythm (tachycardia), it sends a low-energy shock to the heart to restore the heartbeat to normal (cardioversion). If cardioversion does not work or if the ICD detects uncoordinated heart contractions (fibrillation), it delivers a high-energy shock to the heart (defibrillation) to restart the heart.  You will need to stay in the hospital for 1-2 days to make sure your ICD is working. This information is not intended to replace advice given to you by your health care provider. Make sure you discuss any questions you have with your health care provider. Document Released: 07/25/2002 Document Revised: 10/15/2017 Document Reviewed: 11/11/2016 Elsevier Patient Education  2020 [employer].

## 2019-09-05 NOTE — Progress Notes (Signed)
Patient Care Team: [name] Harrier, MD as PCP - General (Internal Medicine) Minna Merritts, MD as PCP - Cardiology (Cardiology) Deboraha Sprang, MD as PCP - Electrophysiology (Cardiology) Alisa Graff, FNP as Nurse Practitioner (Family Medicine)   HPI  [name] [name] is a 58 y.o. male seen early in March prior to Covid for consideration of an ICD for primary prevention for ICM - prior CABG/CHF .  With his inclination to defer a decision and hope that his heart muscle function will continue to improve.  History of MRSA infection complicated by shock and renal failure.  Struggling with profound orhtostatic hypotension, needs a walker at home but often not able to make it 20-30 ft  Admitted 9/20 with syncope, occurring while walking across the house  Tel VT NS and discharged with LIFEVEST  Had been admitted earlier 9/20 with volume overload and had been undergoing vigorous diuresis w significant weight loss  Has CKD    DATE TEST EF   5/15 LHC  40%  3V CAD>> CABG  7/18 Echo   40-45 %   11/19 Echo   30-35 %   11/19 MYOVIEW 36 Inferior Scar Min ischemia  8/20 Echo  30-*35%     Date Cr K Hgb  12/19 2.92 5.1 11.2  2/20  2.9 5.0 11.4  10/20 3.74 5.0 11.1      Records and Results Reviewed   Past Medical History:  Diagnosis Date  . CAD (coronary artery disease)    a. 04/2014 CABG x 4 (LIMA->LAD, VG->Diag, VG->OM, VG->PDA).  . Chronic systolic CHF (congestive heart failure) ([place])    a. 07/2014 Echo: EF 30-35%.  . CKD (chronic kidney disease), stage III    a. 12/2015 Creat 1.8.  . Diabetic foot ulcers ([place])    a. left foot 2nd digit ant 5 th digit 05/03/14; b. 08/2014 s/p amputation of toes on L foot.  . Hypercholesterolemia    a. 12/2015 TC 116, TG 154, HDL 24, LDL 61-->Atorvastatin 40.  [name] [name] Hypertension   . Hypertensive heart disease   . Ischemic cardiomyopathy    a. 07/2013 Echo: EF 20%; b. 07/2014 Echo: EF 30-35%, diff HK, Gr1 DD, mildly dil LA, nl  PASP.  [name] [name] Myocardial infarction ([place])   . Neuropathy in diabetes ([place])   . Open wound    foot  . Orthostatic hypotension   . Peripheral vascular disease ([place])   . Type II diabetes mellitus ([place])    a. 12/2015 HbA1c = 13.9.    Past Surgical History:  Procedure Laterality Date  . ACHILLES TENDON SURGERY Right 01/22/2017   Procedure: ACHILLES LENGTHENING/KIDNER/TAL/Teno achilles lengthening;  Surgeon: Samara Deist, DPM;  Location: [facility] ORS;  Service: Podiatry;  Laterality: Right;  . CARDIAC CATHETERIZATION     03/2014  . CATARACT EXTRACTION    . CORONARY ARTERY BYPASS GRAFT N/A 05/07/2014   Procedure: CORONARY ARTERY BYPASS GRAFTING (CABG);  Surgeon: Gaye Pollack, MD;  Location: [facility];  Service: Open Heart Surgery;  Laterality: N/A;  Times 4 using left internal mammary artery and endoscopically harvested right saphenous vein  . DIALYSIS/PERMA CATHETER INSERTION N/A 08/04/2019   Procedure: DIALYSIS/PERMA CATHETER INSERTION;  Surgeon: Katha Cabal, MD;  Location: [facility];  Service: Cardiovascular;  Laterality: N/A;  . DIALYSIS/PERMA CATHETER REMOVAL N/A 08/08/2019   Procedure: DIALYSIS/PERMA CATHETER REMOVAL;  Surgeon: Katha Cabal, MD;  Location: [facility];  Service: Cardiovascular;  Laterality: N/A;  . INTRAOPERATIVE  TRANSESOPHAGEAL ECHOCARDIOGRAM N/A 05/07/2014   Procedure: INTRAOPERATIVE TRANSESOPHAGEAL ECHOCARDIOGRAM;  Surgeon: Gaye Pollack, MD;  Location: [facility] OR;  Service: Open Heart Surgery;  Laterality: N/A;  . left foot osteomyelitis and wound after surgery    . TOE AMPUTATION     Left 3 and 4 toes  . TONSILECTOMY/ADENOIDECTOMY WITH MYRINGOTOMY      Current Meds  Medication Sig  . aspirin EC 81 MG tablet Take 81 mg by mouth daily.  [name] [name] atorvastatin (LIPITOR) 40 MG tablet Take 1 tablet (40 mg total) by mouth at bedtime.  . carvedilol (COREG) 25 MG tablet Take 1 tablet (25 mg total) by mouth 2 (two) times daily with a meal. (Patient taking  differently: Take 25 mg by mouth at bedtime. )  . Cyanocobalamin (VITAMIN B 12 PO) Take 1 tablet by mouth at bedtime.   . ferrous sulfate 325 (65 FE) MG tablet Take 325 mg by mouth daily.  . hydrALAZINE (APRESOLINE) 50 MG tablet Take 1 tablet (50 mg total) by mouth 3 (three) times daily. (Patient taking differently: Take 25 mg by mouth 3 (three) times daily. )  . insulin aspart (NOVOLOG) 100 UNIT/ML injection Inject 0-9 Units into the skin 3 (three) times daily with meals.  . insulin detemir (LEVEMIR) 100 UNIT/ML injection Inject 0.15 mLs (15 Units total) into the skin daily. (Patient taking differently: Inject 20 Units into the skin daily. )  . isosorbide mononitrate (IMDUR) 30 MG 24 hr tablet Take 1 tablet (30 mg total) by mouth 2 (two) times daily.  . multivitamin (RENA-VIT) TABS tablet Take 1 tablet by mouth at bedtime.  . nitroGLYCERIN (NITROSTAT) 0.4 MG SL tablet Place 1 tablet (0.4 mg total) under the tongue every 5 (five) minutes as needed for chest pain.  . sodium bicarbonate 650 MG tablet Take 1,300 mg by mouth 2 (two) times daily.   [name] [name] torsemide (DEMADEX) 20 MG tablet Take 2 tablets (40 mg total) by mouth daily.    Allergies  Allergen Reactions  . 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 Doesn't believe he has an allergy to PCN as he has taken it previously, according to wife.  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 Doesn't believe he has an allergy to PCN as he has taken it previously, according to wife.      Review of Systems negative except from HPI and PMH  Physical Exam BP 133/79 (BP Location: Right Arm, Patient Position: Sitting, Cuff Size: Large)   Pulse 69   Ht 6\' 1"  (1.854 m)   Wt 248 lb 8 oz (112.7 kg)   SpO2 97%   BMI 32.79 kg/m  Well developed and well nourished in no acute distress HENT normal E scleral and icterus  clear Neck Supple JVP flat; carotids brisk and full Clear to ausculation LifeVest in place Regular rate and rhythm, no murmurs gallops or rub Soft with active bowel sounds No clubbing cyanosis Trace Edema Alert and oriented, grossly normal motor and sensory function Skin Warm and Dry    Assessment and  Plan Ischemic cardiomyopathy  Congestive heart failure-chronic-systolic-class IIb  Ventricular tachycardia-nonsustained  Renal insufficiency grade 4  Syncope   Orthostatic Hypotension   The patient has persistent left ventricular dysfunction which despite guideline directed maximally tolerated therapy remains in the 30-35% range.  We discussed the role of an ICD for primary prevention in the context of impending dialysis.  Given  the borderline ejection fraction, it is not so clear that an ICD is appropriate for primary prevention.  However, the syncopal episode is very concerning.  It may be related to his orthostasis as it occurred after standing or walking across the room, however, it was very abrupt in onset and relatively brief.  It is hard to know whether it is arrhythmic or not.  Moreover, he has had prolonged nonsustained ventricular tachycardia which has been shown to be a marker of increased risk in ischemic cardiomyopathy.  Hence, we have elected to pursue ICD implantation for combination of primary and secondary prevention realizing that there are potential risks associated with endovascular placement in the context of future dialysis.  Apparently nephrology has considered that he might be a candidate for peritoneal dialysis  Have reviewed the potential benefits and risks of ICD implantation including but not limited to death, perforation of heart or lung, lead dislodgement, infection,  device malfunction and inappropriate shocks.  The patient and family express understanding  and are willing to proceed.   His wife was engaged over the telephone  More than 50% of 40 min  was spent in counseling related to the above   Current medicines are reviewed at length with the patient today .  The patient does not have concerns regarding medicines.

## 2019-09-06 DIAGNOSIS — E1151 Type 2 diabetes mellitus with diabetic peripheral angiopathy without gangrene: Secondary | ICD-10-CM | POA: Diagnosis not present

## 2019-09-06 DIAGNOSIS — I255 Ischemic cardiomyopathy: Secondary | ICD-10-CM | POA: Diagnosis not present

## 2019-09-06 DIAGNOSIS — I2510 Atherosclerotic heart disease of native coronary artery without angina pectoris: Secondary | ICD-10-CM | POA: Diagnosis not present

## 2019-09-06 DIAGNOSIS — I5023 Acute on chronic systolic (congestive) heart failure: Secondary | ICD-10-CM | POA: Diagnosis not present

## 2019-09-06 DIAGNOSIS — I130 Hypertensive heart and chronic kidney disease with heart failure and stage 1 through stage 4 chronic kidney disease, or unspecified chronic kidney disease: Secondary | ICD-10-CM | POA: Diagnosis not present

## 2019-09-06 DIAGNOSIS — E1122 Type 2 diabetes mellitus with diabetic chronic kidney disease: Secondary | ICD-10-CM | POA: Diagnosis not present

## 2019-09-06 DIAGNOSIS — D631 Anemia in chronic kidney disease: Secondary | ICD-10-CM | POA: Diagnosis not present

## 2019-09-06 DIAGNOSIS — N184 Chronic kidney disease, stage 4 (severe): Secondary | ICD-10-CM | POA: Diagnosis not present

## 2019-09-06 DIAGNOSIS — E1140 Type 2 diabetes mellitus with diabetic neuropathy, unspecified: Secondary | ICD-10-CM | POA: Diagnosis not present

## 2019-09-06 NOTE — Telephone Encounter (Signed)
2nd encounter made in error.

## 2019-09-07 DIAGNOSIS — E1122 Type 2 diabetes mellitus with diabetic chronic kidney disease: Secondary | ICD-10-CM | POA: Diagnosis not present

## 2019-09-07 DIAGNOSIS — I255 Ischemic cardiomyopathy: Secondary | ICD-10-CM | POA: Diagnosis not present

## 2019-09-07 DIAGNOSIS — I130 Hypertensive heart and chronic kidney disease with heart failure and stage 1 through stage 4 chronic kidney disease, or unspecified chronic kidney disease: Secondary | ICD-10-CM | POA: Diagnosis not present

## 2019-09-07 DIAGNOSIS — E1151 Type 2 diabetes mellitus with diabetic peripheral angiopathy without gangrene: Secondary | ICD-10-CM | POA: Diagnosis not present

## 2019-09-07 DIAGNOSIS — I5023 Acute on chronic systolic (congestive) heart failure: Secondary | ICD-10-CM | POA: Diagnosis not present

## 2019-09-07 DIAGNOSIS — E1140 Type 2 diabetes mellitus with diabetic neuropathy, unspecified: Secondary | ICD-10-CM | POA: Diagnosis not present

## 2019-09-07 DIAGNOSIS — N184 Chronic kidney disease, stage 4 (severe): Secondary | ICD-10-CM | POA: Diagnosis not present

## 2019-09-07 DIAGNOSIS — I2510 Atherosclerotic heart disease of native coronary artery without angina pectoris: Secondary | ICD-10-CM | POA: Diagnosis not present

## 2019-09-07 DIAGNOSIS — D631 Anemia in chronic kidney disease: Secondary | ICD-10-CM | POA: Diagnosis not present

## 2019-09-11 DIAGNOSIS — E1142 Type 2 diabetes mellitus with diabetic polyneuropathy: Secondary | ICD-10-CM | POA: Diagnosis not present

## 2019-09-11 DIAGNOSIS — E1122 Type 2 diabetes mellitus with diabetic chronic kidney disease: Secondary | ICD-10-CM | POA: Diagnosis not present

## 2019-09-11 DIAGNOSIS — D631 Anemia in chronic kidney disease: Secondary | ICD-10-CM | POA: Diagnosis not present

## 2019-09-11 DIAGNOSIS — I2510 Atherosclerotic heart disease of native coronary artery without angina pectoris: Secondary | ICD-10-CM | POA: Diagnosis not present

## 2019-09-11 DIAGNOSIS — N184 Chronic kidney disease, stage 4 (severe): Secondary | ICD-10-CM | POA: Diagnosis not present

## 2019-09-11 DIAGNOSIS — I130 Hypertensive heart and chronic kidney disease with heart failure and stage 1 through stage 4 chronic kidney disease, or unspecified chronic kidney disease: Secondary | ICD-10-CM | POA: Diagnosis not present

## 2019-09-11 DIAGNOSIS — E1151 Type 2 diabetes mellitus with diabetic peripheral angiopathy without gangrene: Secondary | ICD-10-CM | POA: Diagnosis not present

## 2019-09-11 DIAGNOSIS — I5023 Acute on chronic systolic (congestive) heart failure: Secondary | ICD-10-CM | POA: Diagnosis not present

## 2019-09-11 DIAGNOSIS — I255 Ischemic cardiomyopathy: Secondary | ICD-10-CM | POA: Diagnosis not present

## 2019-09-13 DIAGNOSIS — E1122 Type 2 diabetes mellitus with diabetic chronic kidney disease: Secondary | ICD-10-CM | POA: Diagnosis not present

## 2019-09-13 DIAGNOSIS — I130 Hypertensive heart and chronic kidney disease with heart failure and stage 1 through stage 4 chronic kidney disease, or unspecified chronic kidney disease: Secondary | ICD-10-CM | POA: Diagnosis not present

## 2019-09-13 DIAGNOSIS — I5023 Acute on chronic systolic (congestive) heart failure: Secondary | ICD-10-CM | POA: Diagnosis not present

## 2019-09-13 DIAGNOSIS — I255 Ischemic cardiomyopathy: Secondary | ICD-10-CM | POA: Diagnosis not present

## 2019-09-13 DIAGNOSIS — D631 Anemia in chronic kidney disease: Secondary | ICD-10-CM | POA: Diagnosis not present

## 2019-09-13 DIAGNOSIS — N184 Chronic kidney disease, stage 4 (severe): Secondary | ICD-10-CM | POA: Diagnosis not present

## 2019-09-13 DIAGNOSIS — I2510 Atherosclerotic heart disease of native coronary artery without angina pectoris: Secondary | ICD-10-CM | POA: Diagnosis not present

## 2019-09-13 DIAGNOSIS — E1142 Type 2 diabetes mellitus with diabetic polyneuropathy: Secondary | ICD-10-CM | POA: Diagnosis not present

## 2019-09-13 DIAGNOSIS — E1151 Type 2 diabetes mellitus with diabetic peripheral angiopathy without gangrene: Secondary | ICD-10-CM | POA: Diagnosis not present

## 2019-09-14 DIAGNOSIS — E1151 Type 2 diabetes mellitus with diabetic peripheral angiopathy without gangrene: Secondary | ICD-10-CM | POA: Diagnosis not present

## 2019-09-14 DIAGNOSIS — I5023 Acute on chronic systolic (congestive) heart failure: Secondary | ICD-10-CM | POA: Diagnosis not present

## 2019-09-14 DIAGNOSIS — I255 Ischemic cardiomyopathy: Secondary | ICD-10-CM | POA: Diagnosis not present

## 2019-09-14 DIAGNOSIS — E1122 Type 2 diabetes mellitus with diabetic chronic kidney disease: Secondary | ICD-10-CM | POA: Diagnosis not present

## 2019-09-14 DIAGNOSIS — I130 Hypertensive heart and chronic kidney disease with heart failure and stage 1 through stage 4 chronic kidney disease, or unspecified chronic kidney disease: Secondary | ICD-10-CM | POA: Diagnosis not present

## 2019-09-14 DIAGNOSIS — N184 Chronic kidney disease, stage 4 (severe): Secondary | ICD-10-CM | POA: Diagnosis not present

## 2019-09-14 DIAGNOSIS — I2510 Atherosclerotic heart disease of native coronary artery without angina pectoris: Secondary | ICD-10-CM | POA: Diagnosis not present

## 2019-09-14 DIAGNOSIS — D631 Anemia in chronic kidney disease: Secondary | ICD-10-CM | POA: Diagnosis not present

## 2019-09-14 DIAGNOSIS — E1142 Type 2 diabetes mellitus with diabetic polyneuropathy: Secondary | ICD-10-CM | POA: Diagnosis not present

## 2019-09-18 DIAGNOSIS — I252 Old myocardial infarction: Secondary | ICD-10-CM | POA: Diagnosis not present

## 2019-09-19 DIAGNOSIS — N184 Chronic kidney disease, stage 4 (severe): Secondary | ICD-10-CM | POA: Diagnosis not present

## 2019-09-19 DIAGNOSIS — I2510 Atherosclerotic heart disease of native coronary artery without angina pectoris: Secondary | ICD-10-CM | POA: Diagnosis not present

## 2019-09-19 DIAGNOSIS — E1122 Type 2 diabetes mellitus with diabetic chronic kidney disease: Secondary | ICD-10-CM | POA: Diagnosis not present

## 2019-09-19 DIAGNOSIS — I255 Ischemic cardiomyopathy: Secondary | ICD-10-CM | POA: Diagnosis not present

## 2019-09-19 DIAGNOSIS — D631 Anemia in chronic kidney disease: Secondary | ICD-10-CM | POA: Diagnosis not present

## 2019-09-19 DIAGNOSIS — I5023 Acute on chronic systolic (congestive) heart failure: Secondary | ICD-10-CM | POA: Diagnosis not present

## 2019-09-19 DIAGNOSIS — E1151 Type 2 diabetes mellitus with diabetic peripheral angiopathy without gangrene: Secondary | ICD-10-CM | POA: Diagnosis not present

## 2019-09-19 DIAGNOSIS — E1142 Type 2 diabetes mellitus with diabetic polyneuropathy: Secondary | ICD-10-CM | POA: Diagnosis not present

## 2019-09-19 DIAGNOSIS — I130 Hypertensive heart and chronic kidney disease with heart failure and stage 1 through stage 4 chronic kidney disease, or unspecified chronic kidney disease: Secondary | ICD-10-CM | POA: Diagnosis not present

## 2019-09-20 DIAGNOSIS — D631 Anemia in chronic kidney disease: Secondary | ICD-10-CM | POA: Diagnosis not present

## 2019-09-20 DIAGNOSIS — I2510 Atherosclerotic heart disease of native coronary artery without angina pectoris: Secondary | ICD-10-CM | POA: Diagnosis not present

## 2019-09-20 DIAGNOSIS — E1142 Type 2 diabetes mellitus with diabetic polyneuropathy: Secondary | ICD-10-CM | POA: Diagnosis not present

## 2019-09-20 DIAGNOSIS — I255 Ischemic cardiomyopathy: Secondary | ICD-10-CM | POA: Diagnosis not present

## 2019-09-20 DIAGNOSIS — I130 Hypertensive heart and chronic kidney disease with heart failure and stage 1 through stage 4 chronic kidney disease, or unspecified chronic kidney disease: Secondary | ICD-10-CM | POA: Diagnosis not present

## 2019-09-20 DIAGNOSIS — I5023 Acute on chronic systolic (congestive) heart failure: Secondary | ICD-10-CM | POA: Diagnosis not present

## 2019-09-20 DIAGNOSIS — E1122 Type 2 diabetes mellitus with diabetic chronic kidney disease: Secondary | ICD-10-CM | POA: Diagnosis not present

## 2019-09-20 DIAGNOSIS — N184 Chronic kidney disease, stage 4 (severe): Secondary | ICD-10-CM | POA: Diagnosis not present

## 2019-09-20 DIAGNOSIS — E1151 Type 2 diabetes mellitus with diabetic peripheral angiopathy without gangrene: Secondary | ICD-10-CM | POA: Diagnosis not present

## 2019-09-22 DIAGNOSIS — I10 Essential (primary) hypertension: Secondary | ICD-10-CM | POA: Diagnosis not present

## 2019-09-22 DIAGNOSIS — E1129 Type 2 diabetes mellitus with other diabetic kidney complication: Secondary | ICD-10-CM | POA: Diagnosis not present

## 2019-09-22 DIAGNOSIS — N184 Chronic kidney disease, stage 4 (severe): Secondary | ICD-10-CM | POA: Diagnosis not present

## 2019-09-26 DIAGNOSIS — I2510 Atherosclerotic heart disease of native coronary artery without angina pectoris: Secondary | ICD-10-CM | POA: Diagnosis not present

## 2019-09-26 DIAGNOSIS — E1122 Type 2 diabetes mellitus with diabetic chronic kidney disease: Secondary | ICD-10-CM | POA: Diagnosis not present

## 2019-09-26 DIAGNOSIS — N184 Chronic kidney disease, stage 4 (severe): Secondary | ICD-10-CM | POA: Diagnosis not present

## 2019-09-26 DIAGNOSIS — I255 Ischemic cardiomyopathy: Secondary | ICD-10-CM | POA: Diagnosis not present

## 2019-09-26 DIAGNOSIS — I5023 Acute on chronic systolic (congestive) heart failure: Secondary | ICD-10-CM | POA: Diagnosis not present

## 2019-09-26 DIAGNOSIS — E1142 Type 2 diabetes mellitus with diabetic polyneuropathy: Secondary | ICD-10-CM | POA: Diagnosis not present

## 2019-09-26 DIAGNOSIS — E1151 Type 2 diabetes mellitus with diabetic peripheral angiopathy without gangrene: Secondary | ICD-10-CM | POA: Diagnosis not present

## 2019-09-26 DIAGNOSIS — D631 Anemia in chronic kidney disease: Secondary | ICD-10-CM | POA: Diagnosis not present

## 2019-09-26 DIAGNOSIS — I130 Hypertensive heart and chronic kidney disease with heart failure and stage 1 through stage 4 chronic kidney disease, or unspecified chronic kidney disease: Secondary | ICD-10-CM | POA: Diagnosis not present

## 2019-09-27 DIAGNOSIS — I951 Orthostatic hypotension: Secondary | ICD-10-CM | POA: Diagnosis not present

## 2019-09-27 DIAGNOSIS — N2581 Secondary hyperparathyroidism of renal origin: Secondary | ICD-10-CM | POA: Diagnosis not present

## 2019-09-27 DIAGNOSIS — N184 Chronic kidney disease, stage 4 (severe): Secondary | ICD-10-CM | POA: Diagnosis not present

## 2019-09-27 DIAGNOSIS — R808 Other proteinuria: Secondary | ICD-10-CM | POA: Diagnosis not present

## 2019-09-27 DIAGNOSIS — E1129 Type 2 diabetes mellitus with other diabetic kidney complication: Secondary | ICD-10-CM | POA: Diagnosis not present

## 2019-09-27 DIAGNOSIS — E790 Hyperuricemia without signs of inflammatory arthritis and tophaceous disease: Secondary | ICD-10-CM | POA: Diagnosis not present

## 2019-09-28 DIAGNOSIS — I130 Hypertensive heart and chronic kidney disease with heart failure and stage 1 through stage 4 chronic kidney disease, or unspecified chronic kidney disease: Secondary | ICD-10-CM | POA: Diagnosis not present

## 2019-09-28 DIAGNOSIS — E1142 Type 2 diabetes mellitus with diabetic polyneuropathy: Secondary | ICD-10-CM | POA: Diagnosis not present

## 2019-09-28 DIAGNOSIS — D631 Anemia in chronic kidney disease: Secondary | ICD-10-CM | POA: Diagnosis not present

## 2019-09-28 DIAGNOSIS — I5023 Acute on chronic systolic (congestive) heart failure: Secondary | ICD-10-CM | POA: Diagnosis not present

## 2019-09-28 DIAGNOSIS — I255 Ischemic cardiomyopathy: Secondary | ICD-10-CM | POA: Diagnosis not present

## 2019-09-28 DIAGNOSIS — I2510 Atherosclerotic heart disease of native coronary artery without angina pectoris: Secondary | ICD-10-CM | POA: Diagnosis not present

## 2019-09-28 DIAGNOSIS — E1122 Type 2 diabetes mellitus with diabetic chronic kidney disease: Secondary | ICD-10-CM | POA: Diagnosis not present

## 2019-09-28 DIAGNOSIS — E1151 Type 2 diabetes mellitus with diabetic peripheral angiopathy without gangrene: Secondary | ICD-10-CM | POA: Diagnosis not present

## 2019-09-28 DIAGNOSIS — N184 Chronic kidney disease, stage 4 (severe): Secondary | ICD-10-CM | POA: Diagnosis not present

## 2019-09-29 ENCOUNTER — Other Ambulatory Visit
Admission: RE | Admit: 2019-09-29 | Discharge: 2019-09-29 | Disposition: A | Discharge status: Home / Self Care | Payer: Medicare HMO | Source: Home / Self Care | Attending: Internal Medicine | Admitting: Internal Medicine

## 2019-09-29 ENCOUNTER — Other Ambulatory Visit
Admission: RE | Admit: 2019-09-29 | Discharge: 2019-09-29 | Disposition: A | Discharge status: Home / Self Care | Payer: Medicare HMO | Source: Ambulatory Visit | Attending: Internal Medicine | Admitting: Internal Medicine

## 2019-09-29 ENCOUNTER — Other Ambulatory Visit: Payer: Self-pay

## 2019-09-29 DIAGNOSIS — I255 Ischemic cardiomyopathy: Secondary | ICD-10-CM

## 2019-09-29 DIAGNOSIS — Z01812 Encounter for preprocedural laboratory examination: Secondary | ICD-10-CM | POA: Diagnosis not present

## 2019-09-29 DIAGNOSIS — I4729 Other ventricular tachycardia: Secondary | ICD-10-CM

## 2019-09-29 DIAGNOSIS — Z20828 Contact with and (suspected) exposure to other viral communicable diseases: Secondary | ICD-10-CM | POA: Insufficient documentation

## 2019-09-29 DIAGNOSIS — I472 Ventricular tachycardia: Secondary | ICD-10-CM

## 2019-09-29 DIAGNOSIS — I5022 Chronic systolic (congestive) heart failure: Secondary | ICD-10-CM

## 2019-09-29 LAB — CBC WITH DIFFERENTIAL/PLATELET
Abs Immature Granulocytes: 0.02 10*3/uL (ref 0.00–0.07)
Basophils Absolute: 0 10*3/uL (ref 0.0–0.1)
Basophils Relative: 1 %
Eosinophils Absolute: 0.2 10*3/uL (ref 0.0–0.5)
Eosinophils Relative: 2 %
HCT: 35.3 % — ABNORMAL LOW (ref 39.0–52.0)
Hemoglobin: 11.8 g/dL — ABNORMAL LOW (ref 13.0–17.0)
Immature Granulocytes: 0 %
Lymphocytes Relative: 21 %
Lymphs Abs: 1.6 10*3/uL (ref 0.7–4.0)
MCH: 30.5 pg (ref 26.0–34.0)
MCHC: 33.4 g/dL (ref 30.0–36.0)
MCV: 91.2 fL (ref 80.0–100.0)
Monocytes Absolute: 0.7 10*3/uL (ref 0.1–1.0)
Monocytes Relative: 9 %
Neutro Abs: 4.9 10*3/uL (ref 1.7–7.7)
Neutrophils Relative %: 67 %
Platelets: 228 10*3/uL (ref 150–400)
RBC: 3.87 MIL/uL — ABNORMAL LOW (ref 4.22–5.81)
RDW: 13.8 % (ref 11.5–15.5)
WBC: 7.4 10*3/uL (ref 4.0–10.5)
nRBC: 0 % (ref 0.0–0.2)

## 2019-09-29 LAB — BASIC METABOLIC PANEL
Anion gap: 12 (ref 5–15)
BUN: 84 mg/dL — ABNORMAL HIGH (ref 6–20)
CO2: 23 mmol/L (ref 22–32)
Calcium: 9.1 mg/dL (ref 8.9–10.3)
Chloride: 100 mmol/L (ref 98–111)
Creatinine, Ser: 3.88 mg/dL — ABNORMAL HIGH (ref 0.61–1.24)
GFR calc Af Amer: 19 mL/min — ABNORMAL LOW (ref >60)
GFR calc non Af Amer: 16 mL/min — ABNORMAL LOW (ref >60)
Glucose, Bld: 191 mg/dL — ABNORMAL HIGH (ref 70–99)
Potassium: 5.1 mmol/L (ref 3.5–5.1)
Sodium: 135 mmol/L (ref 135–145)

## 2019-09-30 LAB — SARS CORONAVIRUS 2 (TAT 6-24 HRS): SARS Coronavirus 2: NEGATIVE

## 2019-10-03 DIAGNOSIS — I2510 Atherosclerotic heart disease of native coronary artery without angina pectoris: Secondary | ICD-10-CM | POA: Diagnosis not present

## 2019-10-03 DIAGNOSIS — N183 Chronic kidney disease, stage 3 unspecified: Secondary | ICD-10-CM | POA: Diagnosis not present

## 2019-10-03 DIAGNOSIS — I5022 Chronic systolic (congestive) heart failure: Secondary | ICD-10-CM | POA: Diagnosis not present

## 2019-10-03 DIAGNOSIS — Z794 Long term (current) use of insulin: Secondary | ICD-10-CM | POA: Diagnosis not present

## 2019-10-03 DIAGNOSIS — E1142 Type 2 diabetes mellitus with diabetic polyneuropathy: Secondary | ICD-10-CM | POA: Diagnosis not present

## 2019-10-03 DIAGNOSIS — I255 Ischemic cardiomyopathy: Secondary | ICD-10-CM | POA: Diagnosis not present

## 2019-10-03 DIAGNOSIS — I130 Hypertensive heart and chronic kidney disease with heart failure and stage 1 through stage 4 chronic kidney disease, or unspecified chronic kidney disease: Secondary | ICD-10-CM | POA: Diagnosis not present

## 2019-10-03 DIAGNOSIS — E1122 Type 2 diabetes mellitus with diabetic chronic kidney disease: Secondary | ICD-10-CM | POA: Diagnosis not present

## 2019-10-03 DIAGNOSIS — E1151 Type 2 diabetes mellitus with diabetic peripheral angiopathy without gangrene: Secondary | ICD-10-CM | POA: Diagnosis not present

## 2019-10-03 DIAGNOSIS — N184 Chronic kidney disease, stage 4 (severe): Secondary | ICD-10-CM | POA: Diagnosis not present

## 2019-10-03 DIAGNOSIS — Z23 Encounter for immunization: Secondary | ICD-10-CM | POA: Diagnosis not present

## 2019-10-04 ENCOUNTER — Ambulatory Visit (HOSPITAL_COMMUNITY)
Admission: RE | Disposition: A | Discharge status: Home / Self Care | Payer: Self-pay | Source: Home / Self Care | Attending: Internal Medicine

## 2019-10-04 ENCOUNTER — Ambulatory Visit (HOSPITAL_COMMUNITY): Payer: Medicare HMO

## 2019-10-04 ENCOUNTER — Other Ambulatory Visit: Payer: Self-pay

## 2019-10-04 ENCOUNTER — Ambulatory Visit (HOSPITAL_COMMUNITY)
Admission: RE | Admit: 2019-10-04 | Discharge: 2019-10-04 | Disposition: A | Discharge status: Home / Self Care | Payer: Medicare HMO | Attending: Internal Medicine | Admitting: Internal Medicine

## 2019-10-04 DIAGNOSIS — I5022 Chronic systolic (congestive) heart failure: Secondary | ICD-10-CM | POA: Insufficient documentation

## 2019-10-04 DIAGNOSIS — I255 Ischemic cardiomyopathy: Secondary | ICD-10-CM | POA: Diagnosis not present

## 2019-10-04 DIAGNOSIS — I130 Hypertensive heart and chronic kidney disease with heart failure and stage 1 through stage 4 chronic kidney disease, or unspecified chronic kidney disease: Secondary | ICD-10-CM | POA: Insufficient documentation

## 2019-10-04 DIAGNOSIS — I2510 Atherosclerotic heart disease of native coronary artery without angina pectoris: Secondary | ICD-10-CM | POA: Diagnosis not present

## 2019-10-04 DIAGNOSIS — I517 Cardiomegaly: Secondary | ICD-10-CM | POA: Diagnosis not present

## 2019-10-04 DIAGNOSIS — Z951 Presence of aortocoronary bypass graft: Secondary | ICD-10-CM | POA: Diagnosis not present

## 2019-10-04 DIAGNOSIS — Z7982 Long term (current) use of aspirin: Secondary | ICD-10-CM | POA: Diagnosis not present

## 2019-10-04 DIAGNOSIS — I252 Old myocardial infarction: Secondary | ICD-10-CM | POA: Insufficient documentation

## 2019-10-04 DIAGNOSIS — N179 Acute kidney failure, unspecified: Secondary | ICD-10-CM | POA: Diagnosis not present

## 2019-10-04 DIAGNOSIS — Z992 Dependence on renal dialysis: Secondary | ICD-10-CM | POA: Diagnosis not present

## 2019-10-04 DIAGNOSIS — I429 Cardiomyopathy, unspecified: Secondary | ICD-10-CM | POA: Diagnosis not present

## 2019-10-04 DIAGNOSIS — Z006 Encounter for examination for normal comparison and control in clinical research program: Secondary | ICD-10-CM | POA: Insufficient documentation

## 2019-10-04 DIAGNOSIS — E1122 Type 2 diabetes mellitus with diabetic chronic kidney disease: Secondary | ICD-10-CM | POA: Insufficient documentation

## 2019-10-04 DIAGNOSIS — Z880 Allergy status to penicillin: Secondary | ICD-10-CM | POA: Insufficient documentation

## 2019-10-04 DIAGNOSIS — Z79899 Other long term (current) drug therapy: Secondary | ICD-10-CM | POA: Insufficient documentation

## 2019-10-04 DIAGNOSIS — Z8614 Personal history of Methicillin resistant Staphylococcus aureus infection: Secondary | ICD-10-CM | POA: Diagnosis not present

## 2019-10-04 DIAGNOSIS — E7800 Pure hypercholesterolemia, unspecified: Secondary | ICD-10-CM | POA: Insufficient documentation

## 2019-10-04 DIAGNOSIS — I951 Orthostatic hypotension: Secondary | ICD-10-CM | POA: Diagnosis not present

## 2019-10-04 DIAGNOSIS — N183 Chronic kidney disease, stage 3 unspecified: Secondary | ICD-10-CM | POA: Insufficient documentation

## 2019-10-04 DIAGNOSIS — Z95810 Presence of automatic (implantable) cardiac defibrillator: Secondary | ICD-10-CM

## 2019-10-04 DIAGNOSIS — I472 Ventricular tachycardia: Secondary | ICD-10-CM | POA: Insufficient documentation

## 2019-10-04 DIAGNOSIS — Z794 Long term (current) use of insulin: Secondary | ICD-10-CM | POA: Diagnosis not present

## 2019-10-04 DIAGNOSIS — Z89422 Acquired absence of other left toe(s): Secondary | ICD-10-CM | POA: Diagnosis not present

## 2019-10-04 DIAGNOSIS — E1151 Type 2 diabetes mellitus with diabetic peripheral angiopathy without gangrene: Secondary | ICD-10-CM | POA: Insufficient documentation

## 2019-10-04 HISTORY — PX: ICD IMPLANT: EP1208

## 2019-10-04 LAB — SURGICAL PCR SCREEN
MRSA, PCR: NEGATIVE
Staphylococcus aureus: POSITIVE — AB

## 2019-10-04 LAB — GLUCOSE, CAPILLARY: Glucose-Capillary: 174 mg/dL — ABNORMAL HIGH (ref 70–99)

## 2019-10-04 SURGERY — ICD IMPLANT

## 2019-10-04 MED ORDER — FENTANYL CITRATE (PF) 100 MCG/2ML IJ SOLN
INTRAMUSCULAR | Status: DC | PRN
  Administered 2019-10-04: 25 ug via INTRAVENOUS
  Administered 2019-10-04: 50 ug via INTRAVENOUS
  Administered 2019-10-04: 25 ug via INTRAVENOUS

## 2019-10-04 MED ORDER — LIDOCAINE HCL (PF) 1 % IJ SOLN
INTRAMUSCULAR | Status: DC | PRN
  Administered 2019-10-04: 60 mL

## 2019-10-04 MED ORDER — ONDANSETRON HCL 4 MG/2ML IJ SOLN: 4.0000 mg | Freq: Four times a day (QID) | INTRAMUSCULAR | Status: DC | PRN

## 2019-10-04 MED ORDER — FENTANYL CITRATE (PF) 100 MCG/2ML IJ SOLN
INTRAMUSCULAR | Status: AC
  Filled 2019-10-04: qty 2

## 2019-10-04 MED ORDER — SODIUM CHLORIDE 0.9 % IV SOLN
INTRAVENOUS | Status: DC
  Administered 2019-10-04: 07:00:00 via INTRAVENOUS

## 2019-10-04 MED ORDER — MIDAZOLAM HCL 5 MG/5ML IJ SOLN
INTRAMUSCULAR | Status: AC
  Filled 2019-10-04: qty 5

## 2019-10-04 MED ORDER — VANCOMYCIN HCL 10 G IV SOLR
1500.0000 mg | INTRAVENOUS | Status: AC
  Administered 2019-10-04: 08:00:00 1500 mg via INTRAVENOUS
  Filled 2019-10-04: qty 1500

## 2019-10-04 MED ORDER — HEPARIN (PORCINE) IN NACL 1000-0.9 UT/500ML-% IV SOLN
INTRAVENOUS | Status: DC | PRN
  Administered 2019-10-04: 500 mL

## 2019-10-04 MED ORDER — ACETAMINOPHEN 325 MG PO TABS: 325.0000 mg | ORAL_TABLET | ORAL | Status: DC | PRN

## 2019-10-04 MED ORDER — SODIUM CHLORIDE 0.9 % IV SOLN: 80.0000 mg | INTRAVENOUS | Status: DC

## 2019-10-04 MED ORDER — MUPIROCIN 2 % EX OINT
1.0000 "application " | TOPICAL_OINTMENT | Freq: Once | CUTANEOUS | Status: AC
  Administered 2019-10-04: 1 via TOPICAL
  Filled 2019-10-04: qty 22

## 2019-10-04 MED ORDER — SODIUM CHLORIDE 0.9 % IV SOLN
INTRAVENOUS | Status: AC
  Filled 2019-10-04: qty 2

## 2019-10-04 MED ORDER — MIDAZOLAM HCL 5 MG/5ML IJ SOLN
INTRAMUSCULAR | Status: DC | PRN
  Administered 2019-10-04 (×2): 1 mg via INTRAVENOUS
  Administered 2019-10-04: 2 mg via INTRAVENOUS

## 2019-10-04 MED ORDER — LIDOCAINE HCL 1 % IJ SOLN
INTRAMUSCULAR | Status: AC
  Filled 2019-10-04: qty 60

## 2019-10-04 SURGICAL SUPPLY — 7 items
CABLE SURGICAL S-101-97-12 (CABLE) ×3 IMPLANT
HEMOSTAT SURGICEL 2X4 FIBR (HEMOSTASIS) ×3 IMPLANT
ICD GALLANT VR CDVRA500Q (ICD Generator) ×3 IMPLANT
LEAD DURATA 7122Q-65CM (Lead) ×3 IMPLANT
PAD PRO RADIOLUCENT 2001M-C (PAD) ×3 IMPLANT
SHEATH 8FR PRELUDE SNAP 13 (SHEATH) ×3 IMPLANT
TRAY PACEMAKER INSERTION (PACKS) ×3 IMPLANT

## 2019-10-04 NOTE — Discharge Instructions (Signed)
After Your ICD (Implantable Cardiac Defibrillator)    You have a St. Jude ICD   Do not lift your arm above shoulder height for 1 week after your procedure. After 7 days, you may progress as below.     Wednesday October 11, 2019  Thursday October 12, 2019 Friday October 13, 2019 Saturday October 14, 2019    Do not lift, push, pull, or carry anything over 10 pounds with the affected arm until 6 weeks (Wednesday November 15, 2019 ) after your procedure.    Do not drive until you have been seen for your wound check, or as long as instructed by your healthcare provider.    Monitor your defibrillator site for redness, swelling, and drainage. Call the device clinic at [phone] if you experience these symptoms or fever/chills.   If your incision is sealed with Steri-strips or staples, you may shower 7 days after your procedure. Do not remove the steri-strips or let the shower hit directly on your site. You may wash around your site with soap and water. If your incision is closed with Dermabond/Surgical glue. You may shower 1 day after your pacemaker implant and wash around the site with soap and water. Avoid lotions, ointments, or perfumes over your incision until it is well-healed.   You may use a hot tub or a pool AFTER your wound check appointment if the incision is completely closed.   Your ICD  may be MRI compatible. This will be discussed at your next office visit/wound check.    Your ICD is designed to protect you from life threatening heart rhythms. Because of this, you may receive a shock.   o 1 shock with no symptoms:  Call the office during business hours. o 1 shock with symptoms (chest pain, chest pressure, dizziness, lightheadedness, shortness of breath, overall feeling unwell):  Call 911. o If you experience 2 or more shocks in 24 hours:  Call 911. o If you receive a shock, you should not drive for 6 months per the [place] DMV IF you receive appropriate therapy from your  ICD.    ICD Alerts:  Some alerts are vibratory and others beep. These are NOT emergencies. Please call our office to let us know. If this occurs at night or on weekends, it can wait until the next business day. Send a remote transmission.   If your device is capable of reading fluid status (for heart failure), you will be offered monthly monitoring to review this with you.    Remote monitoring is used to monitor your ICD from home. This monitoring is scheduled every 91 days by our office. It allows [place] to keep an eye on the functioning of your device to ensure it is working properly. You will routinely see your Electrophysiologist annually (more often if necessary).    Cardioverter Defibrillator Implantation, Care After This sheet gives you information about how to care for yourself after your procedure. Your health care provider may also give you more specific instructions. If you have problems or questions, contact your health care provider. What can I expect after the procedure? After the procedure, it is common to have:  Some pain. It may last a few days.  A slight bump over the skin where the device was placed. Sometimes, it is possible to feel the device under the skin. This is normal.  During the months and years after your procedure, your health care provider will check the device, the leads, and the battery every few months.  Eventually, when the battery is low, the device will be replaced.  You should receive your defibrillator ID card for your new device in the next 4-8 weeks.  Follow these instructions at home: Medicines  Take over-the-counter and prescription medicines only as told by your health care provider.  If you were prescribed an antibiotic medicine, take it as told by your health care provider. Do not stop taking the antibiotic even if you start to feel better. Incision care        Follow instructions from your health care provider about how to take care of  your incision area. Make sure you: ? Leave stitches (sutures), skin glue, or adhesive strips in place. These skin closures may need to stay in place for 2 weeks or longer. If adhesive strip edges start to loosen and curl up, you may trim the loose edges. Do not remove adhesive strips completely unless your health care provider tells you to do that.  Check your incision area every day for signs of infection. Check for: ? More redness, swelling, or pain. ? More fluid or blood. ? Warmth. ? Pus or a bad smell.  Do not use lotions or ointments near the incision area unless told by your health care provider.  Keep the incision area clean and dry for 7 days after the procedure or for as long as told by your health care provider. It takes several weeks for the incision site to heal completely.  Do not take baths, swim, or use a hot tub until your health care provider approves. Activity  Try to walk a little every day. Exercising is important after this procedure. Also, use your shoulder on the side of the defibrillator in daily tasks that do not require a lot of motion.  For at least 1 week: ? Do not lift your upper arm above your shoulders. This means no tennis, golf, or swimming for this period of time. If you tend to sleep with your arm above your head, use a restraint to prevent this during sleep.  For at least 6 weeks: ? Avoid sudden jerking, pulling, or chopping movements that pull your upper arm far away from your body.  Ask your health care provider when you may go back to work.  Check with your health care provider before you start to drive or play sports. Electric and magnetic fields  Tell all health care providers that you have a defibrillator. This may prevent them from giving you an MRI scan because strong magnets are used for that test.  If you must pass through a metal detector, quickly walk through it. Do not stop under the detector, and do not stand near it.  Avoid places or  objects that have a strong electric or magnetic field, including: ? Airport Herbalist. At the airport, let officials know that you have a defibrillator. Your defibrillator ID card will let you be checked in a way that is safe for you and will not damage your defibrillator. Also, do not let a security person wave a magnetic wand near your defibrillator. That can make it stop working. ? Power plants. ? Large electrical generators. ? Anti-theft systems or electronic article surveillance (EAS). ? Radiofrequency transmission towers, such as cell phone and radio towers.  Do not use amateur (ham) radio equipment or electric (arc) welding torches. Some devices are safe to use if held at least 12 inches (30 cm) from your defibrillator. These include power tools, lawn mowers, and speakers. If you  are unsure if something is safe to use, ask your health care provider.  Do not use MP3 player headphones. They have magnets.  You may safely use electric blankets, heating pads, computers, and microwave ovens.  When using your cell phone, hold it to the ear that is on the opposite side from the defibrillator. Do not leave your cell phone in a pocket over the defibrillator. General instructions  Follow diet instructions from your health care provider, if this applies.  Always keep your defibrillator ID card with you. The card should list the implant date, device model, and manufacturer. Consider wearing a medical alert bracelet or necklace.  Have your defibrillator checked every 3-6 months or as often as told by your health care provider. Most defibrillators last for 4-8 years.  Keep all follow-up visits as told by your health care provider. This is important for your health care provider to make sure your chest is healing the way it should. Ask your health care provider when you should come back to have your stitches or staples taken out. Contact a health care provider if:  You gain weight  suddenly.  Your legs or feet swell more than they have before.  It feels like your heart is fluttering or skipping beats (heart palpitations).  You have more redness, swelling, or pain around your incision.  You have more fluid or blood coming from your incision.  Your incision feels warm to the touch.  You have pus or a bad smell coming from your incision.  You have a fever. Get help right away if:  You have chest pain.  You feel more than one shock.  You feel more short of breath than you have felt before.  You feel more light-headed than you have felt before.  Your incision starts to open up. This information is not intended to replace advice given to you by your health care provider. Make sure you discuss any questions you have with your health care provider.

## 2019-10-04 NOTE — Interval H&P Note (Signed)
History and Physical Interval Note:  10/04/2019 8:40 AM  [name] [name]  has presented today for surgery, with the diagnosis of cardiomyopathy - heart failure.  The various methods of treatment have been discussed with the patient and family. After consideration of risks, benefits and other options for treatment, the patient has consented to  Procedure(s): ICD IMPLANT (N/A) as a surgical intervention.  The patient's history has been reviewed, patient examined, no change in status, stable for surgery.  I have reviewed the patient's chart and labs.  Questions were answered to the patient's satisfaction.     Virl Axe  Discussed orthostasis, physiology and management Have reviewed the potential benefits and risks of ICD implantation including but not limited to death, perforation of heart or lung, lead dislodgement, infection,  device malfunction and inappropriate shocks.  The express understanding  and are willing to proceed.

## 2019-10-05 ENCOUNTER — Encounter (HOSPITAL_COMMUNITY): Payer: Self-pay | Admitting: Internal Medicine

## 2019-10-05 ENCOUNTER — Telehealth: Payer: Self-pay

## 2019-10-05 ENCOUNTER — Telehealth: Payer: Self-pay | Admitting: Emergency Medicine

## 2019-10-05 MED FILL — Lidocaine HCl Local Inj 1%: INTRAMUSCULAR | Qty: 60 | Status: AC

## 2019-10-05 MED FILL — Gentamicin Sulfate Inj 40 MG/ML: INTRAMUSCULAR | Qty: 80 | Status: AC

## 2019-10-05 NOTE — Telephone Encounter (Signed)
Patient asked that I speak with his wife, [name] [name]. She stated his heart rate has com back down into the 70's. He is reported as not feeling well due to BP issues that involve orthostatic hypotension. Patient missed meds yesterday when he was at the hospital to have ICD implant. Medication taken at scheduled today and at time call made to clinic HR was 120-130's before he received medications. Remote transmission show 1:1 A/V rates in 120's. No episodes of VT or treatment by device. Wife reports patient is back to his baseline now iwith HR in 70's. No CP and no SOB. Instructed to contact Dr Candis Musa concerning BP issues.

## 2019-10-05 NOTE — Telephone Encounter (Signed)
Pt wife states the pt heart rate is higher than normal. The pt was just implanted yesterday. The best number to reach the pt is [phone]. I told her the nurse will give them a call back soon. The pt did send a transmission with his monitor.

## 2019-10-06 DIAGNOSIS — I5023 Acute on chronic systolic (congestive) heart failure: Secondary | ICD-10-CM | POA: Diagnosis not present

## 2019-10-06 DIAGNOSIS — I2510 Atherosclerotic heart disease of native coronary artery without angina pectoris: Secondary | ICD-10-CM | POA: Diagnosis not present

## 2019-10-06 DIAGNOSIS — N184 Chronic kidney disease, stage 4 (severe): Secondary | ICD-10-CM | POA: Diagnosis not present

## 2019-10-06 DIAGNOSIS — I255 Ischemic cardiomyopathy: Secondary | ICD-10-CM | POA: Diagnosis not present

## 2019-10-06 DIAGNOSIS — E1122 Type 2 diabetes mellitus with diabetic chronic kidney disease: Secondary | ICD-10-CM | POA: Diagnosis not present

## 2019-10-06 DIAGNOSIS — I130 Hypertensive heart and chronic kidney disease with heart failure and stage 1 through stage 4 chronic kidney disease, or unspecified chronic kidney disease: Secondary | ICD-10-CM | POA: Diagnosis not present

## 2019-10-06 DIAGNOSIS — D631 Anemia in chronic kidney disease: Secondary | ICD-10-CM | POA: Diagnosis not present

## 2019-10-06 DIAGNOSIS — E1142 Type 2 diabetes mellitus with diabetic polyneuropathy: Secondary | ICD-10-CM | POA: Diagnosis not present

## 2019-10-06 DIAGNOSIS — E1151 Type 2 diabetes mellitus with diabetic peripheral angiopathy without gangrene: Secondary | ICD-10-CM | POA: Diagnosis not present

## 2019-10-09 ENCOUNTER — Ambulatory Visit: Payer: Medicare HMO | Admitting: Cardiovascular Disease

## 2019-10-09 DIAGNOSIS — I2510 Atherosclerotic heart disease of native coronary artery without angina pectoris: Secondary | ICD-10-CM | POA: Diagnosis not present

## 2019-10-09 DIAGNOSIS — I255 Ischemic cardiomyopathy: Secondary | ICD-10-CM | POA: Diagnosis not present

## 2019-10-09 DIAGNOSIS — D631 Anemia in chronic kidney disease: Secondary | ICD-10-CM | POA: Diagnosis not present

## 2019-10-09 DIAGNOSIS — N184 Chronic kidney disease, stage 4 (severe): Secondary | ICD-10-CM | POA: Diagnosis not present

## 2019-10-09 DIAGNOSIS — E1151 Type 2 diabetes mellitus with diabetic peripheral angiopathy without gangrene: Secondary | ICD-10-CM | POA: Diagnosis not present

## 2019-10-09 DIAGNOSIS — E1122 Type 2 diabetes mellitus with diabetic chronic kidney disease: Secondary | ICD-10-CM | POA: Diagnosis not present

## 2019-10-09 DIAGNOSIS — I130 Hypertensive heart and chronic kidney disease with heart failure and stage 1 through stage 4 chronic kidney disease, or unspecified chronic kidney disease: Secondary | ICD-10-CM | POA: Diagnosis not present

## 2019-10-09 DIAGNOSIS — E1142 Type 2 diabetes mellitus with diabetic polyneuropathy: Secondary | ICD-10-CM | POA: Diagnosis not present

## 2019-10-09 DIAGNOSIS — I5023 Acute on chronic systolic (congestive) heart failure: Secondary | ICD-10-CM | POA: Diagnosis not present

## 2019-10-16 DIAGNOSIS — E1142 Type 2 diabetes mellitus with diabetic polyneuropathy: Secondary | ICD-10-CM | POA: Diagnosis not present

## 2019-10-16 DIAGNOSIS — I951 Orthostatic hypotension: Secondary | ICD-10-CM | POA: Diagnosis not present

## 2019-10-16 DIAGNOSIS — E1151 Type 2 diabetes mellitus with diabetic peripheral angiopathy without gangrene: Secondary | ICD-10-CM | POA: Diagnosis not present

## 2019-10-16 DIAGNOSIS — D631 Anemia in chronic kidney disease: Secondary | ICD-10-CM | POA: Diagnosis not present

## 2019-10-16 DIAGNOSIS — I130 Hypertensive heart and chronic kidney disease with heart failure and stage 1 through stage 4 chronic kidney disease, or unspecified chronic kidney disease: Secondary | ICD-10-CM | POA: Diagnosis not present

## 2019-10-16 DIAGNOSIS — I5023 Acute on chronic systolic (congestive) heart failure: Secondary | ICD-10-CM | POA: Diagnosis not present

## 2019-10-16 DIAGNOSIS — E1122 Type 2 diabetes mellitus with diabetic chronic kidney disease: Secondary | ICD-10-CM | POA: Diagnosis not present

## 2019-10-16 DIAGNOSIS — N184 Chronic kidney disease, stage 4 (severe): Secondary | ICD-10-CM | POA: Diagnosis not present

## 2019-10-16 DIAGNOSIS — I2510 Atherosclerotic heart disease of native coronary artery without angina pectoris: Secondary | ICD-10-CM | POA: Diagnosis not present

## 2019-10-17 DIAGNOSIS — E1151 Type 2 diabetes mellitus with diabetic peripheral angiopathy without gangrene: Secondary | ICD-10-CM | POA: Diagnosis not present

## 2019-10-17 DIAGNOSIS — I5023 Acute on chronic systolic (congestive) heart failure: Secondary | ICD-10-CM | POA: Diagnosis not present

## 2019-10-17 DIAGNOSIS — I130 Hypertensive heart and chronic kidney disease with heart failure and stage 1 through stage 4 chronic kidney disease, or unspecified chronic kidney disease: Secondary | ICD-10-CM | POA: Diagnosis not present

## 2019-10-17 DIAGNOSIS — E1142 Type 2 diabetes mellitus with diabetic polyneuropathy: Secondary | ICD-10-CM | POA: Diagnosis not present

## 2019-10-17 DIAGNOSIS — E1122 Type 2 diabetes mellitus with diabetic chronic kidney disease: Secondary | ICD-10-CM | POA: Diagnosis not present

## 2019-10-17 DIAGNOSIS — I951 Orthostatic hypotension: Secondary | ICD-10-CM | POA: Diagnosis not present

## 2019-10-17 DIAGNOSIS — I2510 Atherosclerotic heart disease of native coronary artery without angina pectoris: Secondary | ICD-10-CM | POA: Diagnosis not present

## 2019-10-17 DIAGNOSIS — N184 Chronic kidney disease, stage 4 (severe): Secondary | ICD-10-CM | POA: Diagnosis not present

## 2019-10-17 DIAGNOSIS — D631 Anemia in chronic kidney disease: Secondary | ICD-10-CM | POA: Diagnosis not present

## 2019-10-18 ENCOUNTER — Other Ambulatory Visit: Payer: Self-pay

## 2019-10-18 ENCOUNTER — Ambulatory Visit (INDEPENDENT_AMBULATORY_CARE_PROVIDER_SITE_OTHER): Payer: Medicare HMO | Admitting: *Deleted

## 2019-10-18 DIAGNOSIS — I472 Ventricular tachycardia: Secondary | ICD-10-CM

## 2019-10-18 DIAGNOSIS — I4729 Other ventricular tachycardia: Secondary | ICD-10-CM

## 2019-10-18 DIAGNOSIS — I5022 Chronic systolic (congestive) heart failure: Secondary | ICD-10-CM | POA: Diagnosis not present

## 2019-10-18 LAB — CUP PACEART INCLINIC DEVICE CHECK
Brady Statistic RV Percent Paced: 1 %
Date Time Interrogation Session: 20201202103835
Implantable Lead Implant Date: 20201118
Implantable Lead Location: 753860
Implantable Pulse Generator Implant Date: 20201118
Lead Channel Pacing Threshold Amplitude: 0.5 V
Lead Channel Pacing Threshold Pulse Width: 0.5 ms
Lead Channel Sensing Intrinsic Amplitude: 11.4 mV
Pulse Gen Serial Number: 111013097

## 2019-10-18 NOTE — Progress Notes (Signed)
Wound check appointment. Steri-strips removed. Wound without redness or edema. Incision edges approximated, wound well healed. Normal device function. Thresholds, sensing, and impedances consistent with implant measurements. Device programmed at 3.5V for extra safety margin until 3 month visit. Histogram distribution appropriate for patient and level of activity. 1 ventricular arrhythmias noted that appears to be NSVT, 14 cycles in length. Patient educated about wound care, arm mobility, lifting restrictions, shock plan. Next remote transmission  scheduled for 01/04/20. ROV in with Dr Caryl Comes 01/09/20.

## 2019-10-18 NOTE — Patient Instructions (Signed)
Call office for redness, discharge, or increased swelling at incision site.

## 2019-10-19 DIAGNOSIS — E1122 Type 2 diabetes mellitus with diabetic chronic kidney disease: Secondary | ICD-10-CM | POA: Diagnosis not present

## 2019-10-19 DIAGNOSIS — I2510 Atherosclerotic heart disease of native coronary artery without angina pectoris: Secondary | ICD-10-CM | POA: Diagnosis not present

## 2019-10-19 DIAGNOSIS — D631 Anemia in chronic kidney disease: Secondary | ICD-10-CM | POA: Diagnosis not present

## 2019-10-19 DIAGNOSIS — I951 Orthostatic hypotension: Secondary | ICD-10-CM | POA: Diagnosis not present

## 2019-10-19 DIAGNOSIS — N184 Chronic kidney disease, stage 4 (severe): Secondary | ICD-10-CM | POA: Diagnosis not present

## 2019-10-19 DIAGNOSIS — I5023 Acute on chronic systolic (congestive) heart failure: Secondary | ICD-10-CM | POA: Diagnosis not present

## 2019-10-19 DIAGNOSIS — I130 Hypertensive heart and chronic kidney disease with heart failure and stage 1 through stage 4 chronic kidney disease, or unspecified chronic kidney disease: Secondary | ICD-10-CM | POA: Diagnosis not present

## 2019-10-24 DIAGNOSIS — N184 Chronic kidney disease, stage 4 (severe): Secondary | ICD-10-CM | POA: Diagnosis not present

## 2019-10-24 DIAGNOSIS — I951 Orthostatic hypotension: Secondary | ICD-10-CM | POA: Diagnosis not present

## 2019-10-24 DIAGNOSIS — D631 Anemia in chronic kidney disease: Secondary | ICD-10-CM | POA: Diagnosis not present

## 2019-10-24 DIAGNOSIS — E1151 Type 2 diabetes mellitus with diabetic peripheral angiopathy without gangrene: Secondary | ICD-10-CM | POA: Diagnosis not present

## 2019-10-24 DIAGNOSIS — E1122 Type 2 diabetes mellitus with diabetic chronic kidney disease: Secondary | ICD-10-CM | POA: Diagnosis not present

## 2019-10-24 DIAGNOSIS — I5023 Acute on chronic systolic (congestive) heart failure: Secondary | ICD-10-CM | POA: Diagnosis not present

## 2019-10-24 DIAGNOSIS — E1142 Type 2 diabetes mellitus with diabetic polyneuropathy: Secondary | ICD-10-CM | POA: Diagnosis not present

## 2019-10-24 DIAGNOSIS — I2510 Atherosclerotic heart disease of native coronary artery without angina pectoris: Secondary | ICD-10-CM | POA: Diagnosis not present

## 2019-10-24 DIAGNOSIS — I130 Hypertensive heart and chronic kidney disease with heart failure and stage 1 through stage 4 chronic kidney disease, or unspecified chronic kidney disease: Secondary | ICD-10-CM | POA: Diagnosis not present

## 2019-10-26 DIAGNOSIS — D631 Anemia in chronic kidney disease: Secondary | ICD-10-CM | POA: Diagnosis not present

## 2019-10-26 DIAGNOSIS — E1122 Type 2 diabetes mellitus with diabetic chronic kidney disease: Secondary | ICD-10-CM | POA: Diagnosis not present

## 2019-10-26 DIAGNOSIS — I951 Orthostatic hypotension: Secondary | ICD-10-CM | POA: Diagnosis not present

## 2019-10-26 DIAGNOSIS — I2510 Atherosclerotic heart disease of native coronary artery without angina pectoris: Secondary | ICD-10-CM | POA: Diagnosis not present

## 2019-10-26 DIAGNOSIS — I130 Hypertensive heart and chronic kidney disease with heart failure and stage 1 through stage 4 chronic kidney disease, or unspecified chronic kidney disease: Secondary | ICD-10-CM | POA: Diagnosis not present

## 2019-10-26 DIAGNOSIS — E1151 Type 2 diabetes mellitus with diabetic peripheral angiopathy without gangrene: Secondary | ICD-10-CM | POA: Diagnosis not present

## 2019-10-26 DIAGNOSIS — I5023 Acute on chronic systolic (congestive) heart failure: Secondary | ICD-10-CM | POA: Diagnosis not present

## 2019-10-26 DIAGNOSIS — E1142 Type 2 diabetes mellitus with diabetic polyneuropathy: Secondary | ICD-10-CM | POA: Diagnosis not present

## 2019-10-26 DIAGNOSIS — N184 Chronic kidney disease, stage 4 (severe): Secondary | ICD-10-CM | POA: Diagnosis not present

## 2019-10-30 ENCOUNTER — Ambulatory Visit: Payer: Medicare HMO | Admitting: Oncology

## 2019-10-30 ENCOUNTER — Other Ambulatory Visit: Payer: Medicare HMO

## 2019-10-30 DIAGNOSIS — I2510 Atherosclerotic heart disease of native coronary artery without angina pectoris: Secondary | ICD-10-CM | POA: Diagnosis not present

## 2019-10-30 DIAGNOSIS — E1122 Type 2 diabetes mellitus with diabetic chronic kidney disease: Secondary | ICD-10-CM | POA: Diagnosis not present

## 2019-10-30 DIAGNOSIS — D631 Anemia in chronic kidney disease: Secondary | ICD-10-CM | POA: Diagnosis not present

## 2019-10-30 DIAGNOSIS — I951 Orthostatic hypotension: Secondary | ICD-10-CM | POA: Diagnosis not present

## 2019-10-30 DIAGNOSIS — I5023 Acute on chronic systolic (congestive) heart failure: Secondary | ICD-10-CM | POA: Diagnosis not present

## 2019-10-30 DIAGNOSIS — E1151 Type 2 diabetes mellitus with diabetic peripheral angiopathy without gangrene: Secondary | ICD-10-CM | POA: Diagnosis not present

## 2019-10-30 DIAGNOSIS — I130 Hypertensive heart and chronic kidney disease with heart failure and stage 1 through stage 4 chronic kidney disease, or unspecified chronic kidney disease: Secondary | ICD-10-CM | POA: Diagnosis not present

## 2019-10-30 DIAGNOSIS — N184 Chronic kidney disease, stage 4 (severe): Secondary | ICD-10-CM | POA: Diagnosis not present

## 2019-10-30 DIAGNOSIS — E1142 Type 2 diabetes mellitus with diabetic polyneuropathy: Secondary | ICD-10-CM | POA: Diagnosis not present

## 2019-10-31 DIAGNOSIS — I130 Hypertensive heart and chronic kidney disease with heart failure and stage 1 through stage 4 chronic kidney disease, or unspecified chronic kidney disease: Secondary | ICD-10-CM | POA: Diagnosis not present

## 2019-10-31 DIAGNOSIS — I951 Orthostatic hypotension: Secondary | ICD-10-CM | POA: Diagnosis not present

## 2019-10-31 DIAGNOSIS — E1122 Type 2 diabetes mellitus with diabetic chronic kidney disease: Secondary | ICD-10-CM | POA: Diagnosis not present

## 2019-10-31 DIAGNOSIS — D631 Anemia in chronic kidney disease: Secondary | ICD-10-CM | POA: Diagnosis not present

## 2019-10-31 DIAGNOSIS — E1151 Type 2 diabetes mellitus with diabetic peripheral angiopathy without gangrene: Secondary | ICD-10-CM | POA: Diagnosis not present

## 2019-10-31 DIAGNOSIS — N184 Chronic kidney disease, stage 4 (severe): Secondary | ICD-10-CM | POA: Diagnosis not present

## 2019-10-31 DIAGNOSIS — I2510 Atherosclerotic heart disease of native coronary artery without angina pectoris: Secondary | ICD-10-CM | POA: Diagnosis not present

## 2019-10-31 DIAGNOSIS — E1142 Type 2 diabetes mellitus with diabetic polyneuropathy: Secondary | ICD-10-CM | POA: Diagnosis not present

## 2019-10-31 DIAGNOSIS — I5023 Acute on chronic systolic (congestive) heart failure: Secondary | ICD-10-CM | POA: Diagnosis not present

## 2019-11-20 DIAGNOSIS — N184 Chronic kidney disease, stage 4 (severe): Secondary | ICD-10-CM | POA: Diagnosis not present

## 2019-11-23 DIAGNOSIS — I10 Essential (primary) hypertension: Secondary | ICD-10-CM | POA: Diagnosis not present

## 2019-11-23 DIAGNOSIS — N2581 Secondary hyperparathyroidism of renal origin: Secondary | ICD-10-CM | POA: Diagnosis not present

## 2019-11-23 DIAGNOSIS — E790 Hyperuricemia without signs of inflammatory arthritis and tophaceous disease: Secondary | ICD-10-CM | POA: Diagnosis not present

## 2019-11-23 DIAGNOSIS — E1129 Type 2 diabetes mellitus with other diabetic kidney complication: Secondary | ICD-10-CM | POA: Diagnosis not present

## 2019-11-23 DIAGNOSIS — N184 Chronic kidney disease, stage 4 (severe): Secondary | ICD-10-CM | POA: Diagnosis not present

## 2019-11-23 DIAGNOSIS — R808 Other proteinuria: Secondary | ICD-10-CM | POA: Diagnosis not present

## 2020-01-04 ENCOUNTER — Ambulatory Visit (INDEPENDENT_AMBULATORY_CARE_PROVIDER_SITE_OTHER): Payer: Medicare HMO | Admitting: *Deleted

## 2020-01-04 DIAGNOSIS — I5022 Chronic systolic (congestive) heart failure: Secondary | ICD-10-CM | POA: Diagnosis not present

## 2020-01-04 LAB — CUP PACEART REMOTE DEVICE CHECK
Battery Remaining Longevity: 98 mo
Battery Remaining Percentage: 95.5 %
Battery Voltage: 3.05 V
Brady Statistic RV Percent Paced: 0 %
Date Time Interrogation Session: 20210218063309
HighPow Impedance: 71 Ohm
Implantable Lead Implant Date: 20201118
Implantable Lead Location: 753860
Implantable Pulse Generator Implant Date: 20201118
Lead Channel Impedance Value: 440 Ohm
Lead Channel Pacing Threshold Amplitude: 0.5 V
Lead Channel Pacing Threshold Pulse Width: 0.5 ms
Lead Channel Sensing Intrinsic Amplitude: 11.4 mV
Lead Channel Setting Pacing Amplitude: 3.5 V
Lead Channel Setting Pacing Pulse Width: 0.5 ms
Lead Channel Setting Sensing Sensitivity: 0.5 mV
Pulse Gen Serial Number: 111013097

## 2020-01-04 NOTE — Progress Notes (Signed)
ICD Remote  

## 2020-01-09 ENCOUNTER — Other Ambulatory Visit: Payer: Self-pay

## 2020-01-09 ENCOUNTER — Encounter: Payer: Self-pay | Admitting: Internal Medicine

## 2020-01-09 ENCOUNTER — Ambulatory Visit: Payer: Medicare HMO | Admitting: Internal Medicine

## 2020-01-09 VITALS — BP 130/82 | HR 80 | Ht 73.0 in | Wt 254.5 lb

## 2020-01-09 DIAGNOSIS — I951 Orthostatic hypotension: Secondary | ICD-10-CM | POA: Diagnosis not present

## 2020-01-09 DIAGNOSIS — I255 Ischemic cardiomyopathy: Secondary | ICD-10-CM | POA: Diagnosis not present

## 2020-01-09 DIAGNOSIS — I5022 Chronic systolic (congestive) heart failure: Secondary | ICD-10-CM | POA: Diagnosis not present

## 2020-01-09 DIAGNOSIS — I472 Ventricular tachycardia: Secondary | ICD-10-CM

## 2020-01-09 DIAGNOSIS — I4729 Other ventricular tachycardia: Secondary | ICD-10-CM

## 2020-01-09 DIAGNOSIS — Z95810 Presence of automatic (implantable) cardiac defibrillator: Secondary | ICD-10-CM | POA: Diagnosis not present

## 2020-01-09 LAB — CUP PACEART INCLINIC DEVICE CHECK
Battery Remaining Longevity: 104 mo
Brady Statistic RV Percent Paced: 0 %
Date Time Interrogation Session: 20210223103716
HighPow Impedance: 78 Ohm
Implantable Lead Implant Date: 20201118
Implantable Lead Location: 753860
Implantable Pulse Generator Implant Date: 20201118
Lead Channel Impedance Value: 462.5 Ohm
Lead Channel Pacing Threshold Amplitude: 0.5 V
Lead Channel Pacing Threshold Pulse Width: 0.5 ms
Lead Channel Sensing Intrinsic Amplitude: 12 mV
Lead Channel Setting Pacing Amplitude: 2.5 V
Lead Channel Setting Pacing Pulse Width: 0.5 ms
Lead Channel Setting Sensing Sensitivity: 0.5 mV
Pulse Gen Serial Number: 111013097

## 2020-01-09 NOTE — Patient Instructions (Addendum)
Medication Instructions:  - Your physician recommends that you continue on your current medications as directed. Please refer to the Current Medication list given to you today.  *If you need a refill on your cardiac medications before your next appointment, please call your pharmacy*  Lab Work: - none ordered  If you have labs (blood work) drawn today and your tests are completely normal, you will receive your results only by: Marland Kitchen MyChart Message (if you have MyChart) OR . A paper copy in the mail If you have any lab test that is abnormal or we need to change your treatment, we will call you to review the results.  Testing/Procedures: - none ordered  Follow-Up: At [facility], you and your health needs are our priority.  As part of our continuing mission to provide you with exceptional heart care, we have created designated Provider Care Teams.  These Care Teams include your primary Cardiologist (physician) and Advanced Practice Providers (APPs -  Physician Assistants and Nurse Practitioners) who all work together to provide you with the care you need, when you need it.  Your next appointment:   9 month(s)  The format for your next appointment:   In Person  Provider:   Virl Axe, MD  Other Instructions Please come back on Tuesday 01/30/20 at 9:00 am to meet with the [facility]. Jude rep

## 2020-01-09 NOTE — Progress Notes (Signed)
Patient Care Team: Tracie Harrier, MD as PCP - General (Internal Medicine) Minna Merritts, MD as PCP - Cardiology (Cardiology) Deboraha Sprang, MD as PCP - Electrophysiology (Cardiology) Alisa Graff, FNP as Nurse Practitioner (Family Medicine)   HPI  [name] [name] is a 59 y.o. male seen early in March prior to Covid for consideration of an ICD for primary prevention for ICM - prior CABG/CHF .  With his inclination to defer a decision and hope that his heart muscle function will continue to improve.  There was no interval improvement and he underwent ICD implantation 11/20.  History of MRSA infection complicated by shock and renal failure.  under       DATE TEST EF   5/15 LHC  40%  3V CAD>> CABG  7/18 Echo   40-45 %   11/19 Echo   30-35 %   11/19 MYOVIEW 36 Inferior Scar Min ischemia  8/20 Echo  30-*35%     Date Cr K Hgb  12/19 2.92 5.1 11.2  2/20  2.9 5.0 11.4  10/20 3.74 5.0 11.1   Intercurrently his wife has been diagnosed with breast cancer stage III.  He is not smoking.  Couple of hours not awake with his mind racing.  Dyspnea is stable.  No chest pain.  No edema.   Records and Results Reviewed   Past Medical History:  Diagnosis Date  . CAD (coronary artery disease)    a. 04/2014 CABG x 4 (LIMA->LAD, VG->Diag, VG->OM, VG->PDA).  . Chronic systolic CHF (congestive heart failure) ([place])    a. 07/2014 Echo: EF 30-35%.  . CKD (chronic kidney disease), stage III    a. 12/2015 Creat 1.8.  . Diabetic foot ulcers ([place])    a. left foot 2nd digit ant 5 th digit 05/03/14; b. 08/2014 s/p amputation of toes on L foot.  . Hypercholesterolemia    a. 12/2015 TC 116, TG 154, HDL 24, LDL 61-->Atorvastatin 40.  Marland Kitchen Hypertension   . Hypertensive heart disease   . Ischemic cardiomyopathy    a. 07/2013 Echo: EF 20%; b. 07/2014 Echo: EF 30-35%, diff HK, Gr1 DD, mildly dil LA, nl PASP.  Marland Kitchen Myocardial infarction ([place])   . Neuropathy in diabetes ([place])   . Open  wound    foot  . Orthostatic hypotension   . Peripheral vascular disease ([place])   . Type II diabetes mellitus ([place])    a. 12/2015 HbA1c = 13.9.    Past Surgical History:  Procedure Laterality Date  . ACHILLES TENDON SURGERY Right 01/22/2017   Procedure: ACHILLES LENGTHENING/KIDNER/TAL/Teno achilles lengthening;  Surgeon: Samara Deist, DPM;  Location: [facility] ORS;  Service: Podiatry;  Laterality: Right;  . CARDIAC CATHETERIZATION     03/2014  . CATARACT EXTRACTION    . CORONARY ARTERY BYPASS GRAFT N/A 05/07/2014   Procedure: CORONARY ARTERY BYPASS GRAFTING (CABG);  Surgeon: Gaye Pollack, MD;  Location: [place];  Service: Open Heart Surgery;  Laterality: N/A;  Times 4 using left internal mammary artery and endoscopically harvested right saphenous vein  . DIALYSIS/PERMA CATHETER INSERTION N/A 08/04/2019   Procedure: DIALYSIS/PERMA CATHETER INSERTION;  Surgeon: Katha Cabal, MD;  Location: [place] [facility];  Service: Cardiovascular;  Laterality: N/A;  . DIALYSIS/PERMA CATHETER REMOVAL N/A 08/08/2019   Procedure: DIALYSIS/PERMA CATHETER REMOVAL;  Surgeon: Katha Cabal, MD;  Location: [place] [facility];  Service: Cardiovascular;  Laterality: N/A;  . ICD IMPLANT N/A 10/04/2019   Procedure: ICD IMPLANT;  Surgeon: Deboraha Sprang, MD;  Location: [place] [facility];  Service: Cardiovascular;  Laterality: N/A;  . INTRAOPERATIVE TRANSESOPHAGEAL ECHOCARDIOGRAM N/A 05/07/2014   Procedure: INTRAOPERATIVE TRANSESOPHAGEAL ECHOCARDIOGRAM;  Surgeon: Gaye Pollack, MD;  Location: [place] [facility];  Service: Open Heart Surgery;  Laterality: N/A;  . left foot osteomyelitis and wound after surgery    . TOE AMPUTATION     Left 3 and 4 toes  . TONSILECTOMY/ADENOIDECTOMY WITH MYRINGOTOMY      Current Meds  Medication Sig  . acetaminophen (TYLENOL) 325 MG tablet Take by mouth every 6 (six) hours as needed for mild pain or moderate pain.  Marland Kitchen allopurinol (ZYLOPRIM) 100 MG tablet Take 100 mg by mouth daily.  Marland Kitchen  aspirin EC 81 MG tablet Take 81 mg by mouth daily.  Marland Kitchen atorvastatin (LIPITOR) 40 MG tablet Take 1 tablet (40 mg total) by mouth at bedtime.  . carvedilol (COREG) 6.25 MG tablet Take 1 tablet (6.25 mg) by mouth twice daily (Patient taking differently: Take 6.25 mg by mouth 2 (two) times daily with a meal. )  . Cyanocobalamin (VITAMIN B 12 PO) Take 1,000 mcg by mouth at bedtime.   . ferrous sulfate 325 (65 FE) MG tablet Take 325 mg by mouth daily.  . insulin aspart (NOVOLOG) 100 UNIT/ML injection Inject 0-9 Units into the skin 3 (three) times daily with meals. (Patient taking differently: Inject 0-9 Units into the skin 3 (three) times daily with meals. Sliding Scale)  . insulin detemir (LEVEMIR) 100 UNIT/ML injection Inject 0.15 mLs (15 Units total) into the skin daily. (Patient taking differently: Inject 40 Units into the skin daily. )  . isosorbide mononitrate (IMDUR) 30 MG 24 hr tablet Take 1 tablet (30 mg) by mouth once daily (Patient taking differently: Take 30 mg by mouth daily. )  . Multiple Vitamin (MULTIVITAMIN) capsule Take 1 capsule by mouth daily.  . nitroGLYCERIN (NITROSTAT) 0.4 MG SL tablet Place 1 tablet (0.4 mg total) under the tongue every 5 (five) minutes as needed for chest pain.  . sodium bicarbonate 650 MG tablet Take 1,300 mg by mouth 2 (two) times daily.   Marland Kitchen torsemide (DEMADEX) 20 MG tablet Take 2 tablets (40 mg total) by mouth daily. (Patient taking differently: Take 20 mg by mouth daily. )  . [DISCONTINUED] multivitamin (RENA-VIT) TABS tablet Take 1 tablet by mouth at bedtime.    Allergies  Allergen Reactions  . 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 Doesn't believe he has an allergy to PCN as he has taken it previously, according to wife.  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 Doesn't believe he has an allergy to PCN as he has  taken it previously, according to wife.      Review of Systems negative except from HPI and PMH  Physical Exam BP 130/82 (BP Location: Left Arm, Patient Position: Sitting, Cuff Size: Normal)   Pulse 80   Ht 6\' 1"  (1.854 m)   Wt 254 lb 8 oz (115.4 kg)   SpO2 99%   BMI 33.58 kg/m  Well developed and well nourished in no acute distress HENT normal Neck supple with JVP-flat Clear Device pocket well healed; without hematoma or erythema.  There is no tethering  Regular rate and rhythm, no  gallop  murmur Abd-soft with active BS No Clubbing cyanosis  edema Skin-warm and dry A & Oriented  Grossly normal sensory and motor function  ECG sinus at 80 Interval 17/10/37 Nonspecific ST-T changes inferolaterally Anterior septal infarction     Assessment and  Plan Ischemic cardiomyopathy  Congestive heart failure-chronic-systolic-class IIb  Ventricular tachycardia-nonsustained  Implantable defibrillator-[employer] (DOI 11/20)  The patient's device was interrogated.  The information was reviewed. No changes were made in the programming.     Renal insufficiency grade 4  Syncope   Sleep disturbance question depression  Orthostatic Hypotension  Without symptoms of ischemia Euvolemic continue current meds  Sleep disturbance with a.m. awakening certainly suggest depression.  Enough stresses in life to think of that as a secondary event.  Have asked him to reach out to his primary care physician  Nonsustained VT

## 2020-01-16 DIAGNOSIS — N184 Chronic kidney disease, stage 4 (severe): Secondary | ICD-10-CM | POA: Diagnosis not present

## 2020-01-22 DIAGNOSIS — I10 Essential (primary) hypertension: Secondary | ICD-10-CM | POA: Diagnosis not present

## 2020-01-22 DIAGNOSIS — E790 Hyperuricemia without signs of inflammatory arthritis and tophaceous disease: Secondary | ICD-10-CM | POA: Diagnosis not present

## 2020-01-22 DIAGNOSIS — R808 Other proteinuria: Secondary | ICD-10-CM | POA: Diagnosis not present

## 2020-01-22 DIAGNOSIS — N2581 Secondary hyperparathyroidism of renal origin: Secondary | ICD-10-CM | POA: Diagnosis not present

## 2020-01-22 DIAGNOSIS — I951 Orthostatic hypotension: Secondary | ICD-10-CM | POA: Diagnosis not present

## 2020-01-22 DIAGNOSIS — E1129 Type 2 diabetes mellitus with other diabetic kidney complication: Secondary | ICD-10-CM | POA: Diagnosis not present

## 2020-01-22 DIAGNOSIS — N184 Chronic kidney disease, stage 4 (severe): Secondary | ICD-10-CM | POA: Diagnosis not present

## 2020-01-29 ENCOUNTER — Ambulatory Visit: Payer: Medicare HMO

## 2020-01-30 ENCOUNTER — Other Ambulatory Visit: Payer: Self-pay

## 2020-01-30 ENCOUNTER — Ambulatory Visit (INDEPENDENT_AMBULATORY_CARE_PROVIDER_SITE_OTHER): Payer: Medicare HMO | Admitting: *Deleted

## 2020-01-30 DIAGNOSIS — Z95810 Presence of automatic (implantable) cardiac defibrillator: Secondary | ICD-10-CM

## 2020-01-30 DIAGNOSIS — I255 Ischemic cardiomyopathy: Secondary | ICD-10-CM

## 2020-02-02 ENCOUNTER — Telehealth: Payer: Self-pay | Admitting: *Deleted

## 2020-02-02 LAB — CUP PACEART INCLINIC DEVICE CHECK
Battery Remaining Longevity: 104 mo
Brady Statistic RV Percent Paced: 0 %
Date Time Interrogation Session: 20210316082700
HighPow Impedance: 73.125
Implantable Lead Implant Date: 20201118
Implantable Lead Location: 753860
Implantable Pulse Generator Implant Date: 20201118
Lead Channel Impedance Value: 462.5 Ohm
Lead Channel Pacing Threshold Amplitude: 0.5 V
Lead Channel Pacing Threshold Amplitude: 0.5 V
Lead Channel Pacing Threshold Pulse Width: 0.3 ms
Lead Channel Pacing Threshold Pulse Width: 0.3 ms
Lead Channel Sensing Intrinsic Amplitude: 12 mV
Lead Channel Setting Pacing Amplitude: 2.5 V
Lead Channel Setting Pacing Pulse Width: 0.3 ms
Lead Channel Setting Sensing Sensitivity: 0.5 mV
Pulse Gen Serial Number: 111013097

## 2020-02-02 NOTE — Telephone Encounter (Signed)
Spoke with patient to advise that new Merlin app is up to date. Pt verbalizes understanding and denies questions or concerns at this time.

## 2020-02-02 NOTE — Progress Notes (Signed)
ICD check in [facility] by [employer] representative. No episodes. RV PW decreased to 0.41ms. Battery longevity estimated at 8.7-9 years. New Merlin app/monitor paired and given to patient today due to connectivity issues with personal cell phone. Merlin on 04/04/20 and ROV with Dr. Caryl Comes in [place] in 09/2020.

## 2020-02-05 ENCOUNTER — Ambulatory Visit: Payer: Medicare HMO | Attending: Internal Medicine

## 2020-02-05 DIAGNOSIS — Z23 Encounter for immunization: Secondary | ICD-10-CM

## 2020-02-05 NOTE — Progress Notes (Signed)
   Covid-19 Vaccination Clinic  Name:  [name] [name]    MRN: [id number] DOB: 1961-08-15  02/05/2020  [name] was observed post Covid-19 immunization for 15 minutes without incident. He was provided with Vaccine Information Sheet and instruction to access the V-Safe system.   [name] was instructed to call 911 with any severe reactions post vaccine: Marland Kitchen Difficulty breathing  . Swelling of face and throat  . A fast heartbeat  . A bad rash all over body  . Dizziness and weakness   Immunizations Administered    Name Date Dose VIS Date Route   Pfizer COVID-19 Vaccine 02/05/2020 10:53 AM 0.3 mL 10/27/2019 Intramuscular   Manufacturer: [place]   Lot: [id number]   [place]: [id number]

## 2020-02-19 ENCOUNTER — Other Ambulatory Visit: Payer: Self-pay | Admitting: Internal Medicine

## 2020-02-19 ENCOUNTER — Other Ambulatory Visit: Payer: Self-pay | Admitting: Cardiovascular Disease

## 2020-02-20 NOTE — Telephone Encounter (Signed)
Please schedule 6 month F/U with Dr. Rockey Situ. Thank you!

## 2020-02-28 DIAGNOSIS — I739 Peripheral vascular disease, unspecified: Secondary | ICD-10-CM | POA: Diagnosis not present

## 2020-02-28 DIAGNOSIS — E1122 Type 2 diabetes mellitus with diabetic chronic kidney disease: Secondary | ICD-10-CM | POA: Diagnosis not present

## 2020-02-28 DIAGNOSIS — D649 Anemia, unspecified: Secondary | ICD-10-CM | POA: Diagnosis not present

## 2020-02-28 DIAGNOSIS — N183 Chronic kidney disease, stage 3 unspecified: Secondary | ICD-10-CM | POA: Diagnosis not present

## 2020-02-28 DIAGNOSIS — I255 Ischemic cardiomyopathy: Secondary | ICD-10-CM | POA: Diagnosis not present

## 2020-02-28 DIAGNOSIS — I25810 Atherosclerosis of coronary artery bypass graft(s) without angina pectoris: Secondary | ICD-10-CM | POA: Diagnosis not present

## 2020-02-28 DIAGNOSIS — I5022 Chronic systolic (congestive) heart failure: Secondary | ICD-10-CM | POA: Diagnosis not present

## 2020-02-28 DIAGNOSIS — Z794 Long term (current) use of insulin: Secondary | ICD-10-CM | POA: Diagnosis not present

## 2020-02-28 DIAGNOSIS — N184 Chronic kidney disease, stage 4 (severe): Secondary | ICD-10-CM | POA: Diagnosis not present

## 2020-03-05 ENCOUNTER — Ambulatory Visit: Payer: Medicare HMO | Attending: Internal Medicine

## 2020-03-05 DIAGNOSIS — Z23 Encounter for immunization: Secondary | ICD-10-CM

## 2020-03-05 NOTE — Progress Notes (Signed)
   Covid-19 Vaccination Clinic  Name:  [name] [name]    MRN: [id number] DOB: 27-Mar-1961  03/05/2020  [name] was observed post Covid-19 immunization for 15 minutes without incident. He was provided with Vaccine Information Sheet and instruction to access the V-Safe system.   [name] was instructed to call 911 with any severe reactions post vaccine: Marland Kitchen Difficulty breathing  . Swelling of face and throat  . A fast heartbeat  . A bad rash all over body  . Dizziness and weakness   Immunizations Administered    Name Date Dose VIS Date Route   Pfizer COVID-19 Vaccine 03/05/2020 10:29 AM 0.3 mL 01/10/2019 Intramuscular   Manufacturer: [employer], [employer]   Lot: [id number]   [place]: [id number]

## 2020-03-07 DIAGNOSIS — Z0000 Encounter for general adult medical examination without abnormal findings: Secondary | ICD-10-CM | POA: Diagnosis not present

## 2020-03-07 DIAGNOSIS — Z89422 Acquired absence of other left toe(s): Secondary | ICD-10-CM | POA: Diagnosis not present

## 2020-03-07 DIAGNOSIS — Z794 Long term (current) use of insulin: Secondary | ICD-10-CM | POA: Diagnosis not present

## 2020-03-07 DIAGNOSIS — Z89412 Acquired absence of left great toe: Secondary | ICD-10-CM | POA: Diagnosis not present

## 2020-03-07 DIAGNOSIS — N184 Chronic kidney disease, stage 4 (severe): Secondary | ICD-10-CM | POA: Diagnosis not present

## 2020-03-07 DIAGNOSIS — I129 Hypertensive chronic kidney disease with stage 1 through stage 4 chronic kidney disease, or unspecified chronic kidney disease: Secondary | ICD-10-CM | POA: Diagnosis not present

## 2020-03-07 DIAGNOSIS — I255 Ischemic cardiomyopathy: Secondary | ICD-10-CM | POA: Diagnosis not present

## 2020-03-07 DIAGNOSIS — E1122 Type 2 diabetes mellitus with diabetic chronic kidney disease: Secondary | ICD-10-CM | POA: Diagnosis not present

## 2020-03-07 DIAGNOSIS — I2510 Atherosclerotic heart disease of native coronary artery without angina pectoris: Secondary | ICD-10-CM | POA: Diagnosis not present

## 2020-03-07 DIAGNOSIS — D649 Anemia, unspecified: Secondary | ICD-10-CM | POA: Diagnosis not present

## 2020-03-11 DIAGNOSIS — N2581 Secondary hyperparathyroidism of renal origin: Secondary | ICD-10-CM | POA: Diagnosis not present

## 2020-03-11 DIAGNOSIS — R808 Other proteinuria: Secondary | ICD-10-CM | POA: Diagnosis not present

## 2020-03-11 DIAGNOSIS — E1129 Type 2 diabetes mellitus with other diabetic kidney complication: Secondary | ICD-10-CM | POA: Diagnosis not present

## 2020-03-11 DIAGNOSIS — I10 Essential (primary) hypertension: Secondary | ICD-10-CM | POA: Diagnosis not present

## 2020-03-11 DIAGNOSIS — E790 Hyperuricemia without signs of inflammatory arthritis and tophaceous disease: Secondary | ICD-10-CM | POA: Diagnosis not present

## 2020-03-11 DIAGNOSIS — N184 Chronic kidney disease, stage 4 (severe): Secondary | ICD-10-CM | POA: Diagnosis not present

## 2020-03-11 DIAGNOSIS — I951 Orthostatic hypotension: Secondary | ICD-10-CM | POA: Diagnosis not present

## 2020-03-18 NOTE — Progress Notes (Signed)
Date:  03/19/2020   ID:  [name], DOB 1961/10/29, MRN [id number]  Patient Location:  [place] [address]   Provider location:   [facility], [place] office  PCP:  Tracie Harrier, MD  Cardiologist:  Patsy Baltimore  Chief Complaint  Patient presents with  . other    6 month follow up. "doing well." Meds reviewed by the pt. verbally.     History of Present Illness:    [name] [name] is a 59 y.o. male  past medical history of diabetes, poorly controlled with poor diet, finances limiting medication compliance hyperlipidemia,  coronary artery disease with bypass surgery x4 at the end of June 2015 with Dr. Cyndia Bent, orthostatic hypotension  EF at first <20%, Hospitalization November 2019 for acute on chronic systolic CHF ejection fraction 30 to 35%, nonsustained VT on monitors  Syncope,  EF 30 to 35% in 06/2019 ICD for VT who presents for routine follow-up Of his coronary artery disease, history of bypass  Feels well, no SOB Working in the yard No chest pain No palpitations, no ICD shock Breaking imdur in 1/2 daily  Labs reviewed CR 3.88 HBA1C 9.5, (was off diabetes meds) HCT 43 Total chol 128, LDL 73  Weight up 20 pounds in 6 months Poor diet  EKG personally reviewed by myself on todays visit NSR old anterior MI, pvc, old inferior Mi  Past medical history reviewed  hospitalization October 2020, syncope Hypoxia on arrival in the setting of CHF  concern for sustained VT on telemetry EP unable to evaluate patient on an inpatient basis -LifeVest has been placed  Ejection fraction 30 to 35% previous MI, known ischemic   Had significant diuresis during his hospital course -Recommend torsemide 40 daily 3 pound weight gain with take torsemide 40 twice daily  Medication changes madehydralazine up to 50mg  3 times daily, isosorbide 30 twice daily, continue carvedilol  On todays visit, feels good Sugars high, 174 and  higher, usually 200s levimer 20 units yesterday  Weight down to 240 pounds  changed diet Cr 3.25 up to 4.29, repeat down to 3.5 with nephrology  With fluid out, arrhythmia/palpitations are better since he has been home  Vitals today BP today 123/78, pulse 78 bpm  Echo 06/29/2019 EF of 30-35%.  Echo  10/05/2018  EF of 30 to 35%, diffuse hypokinesis   nuclear stress test on 10/07/2018 that showed a large region of fixed inferior and inferior lateral wall perfusion defect consistent with previous MI , EF was estimated at 36%.   Event Monitor Normal sinus rhythm avg HR of 81 bpm. ---8 Ventricular Tachycardia runs occurred, the run with the fastest interval lasting 4 beats with a max rate of 200 bpm,  the longest lasting 17 beats with an avg rate of 120 bpm.  ----7 Supraventricular Tachycardia runs occurred,  the run with the fastest interval lasting 5 beats with a max rate of 148 bpm,  the longest lasting 13 beats with an avg rate of 110 bpm.   history in October 2014 of MRSA, requiring amputation of several of his toes, severe renal dysfunction at that time felt secondary to antibiotics with subsequent improvement of his renal function. Prior echocardiogram September 2014 showing ejection fraction less than 20% ejection fraction was 30-35% by echo 08/02/2014  cardiac catheterization may 2015 showing 70% left main disease, 70% diagonal #2 disease, 80% proximal circumflex disease, 80% mid circumflex disease, 99% proximal RCA disease, 99%  PL branch disease, ejection fraction 40%   Past Medical History:  Diagnosis Date  . CAD (coronary artery disease)    a. 04/2014 CABG x 4 (LIMA->LAD, VG->Diag, VG->OM, VG->PDA).  . Chronic systolic CHF (congestive heart failure) ([place])    a. 07/2014 Echo: EF 30-35%.  . CKD (chronic kidney disease), stage III    a. 12/2015 Creat 1.8.  . Diabetic foot ulcers ([place])    a. left foot 2nd digit ant 5 th digit 05/03/14; b. 08/2014 s/p amputation of toes  on L foot.  . Hypercholesterolemia    a. 12/2015 TC 116, TG 154, HDL 24, LDL 61-->Atorvastatin 40.  Marland Kitchen Hypertension   . Hypertensive heart disease   . Ischemic cardiomyopathy    a. 07/2013 Echo: EF 20%; b. 07/2014 Echo: EF 30-35%, diff HK, Gr1 DD, mildly dil LA, nl PASP.  Marland Kitchen Myocardial infarction ([place])   . Neuropathy in diabetes ([place])   . Open wound    foot  . Orthostatic hypotension   . Peripheral vascular disease ([place])   . Type II diabetes mellitus ([place])    a. 12/2015 HbA1c = 13.9.   Past Surgical History:  Procedure Laterality Date  . ACHILLES TENDON SURGERY Right 01/22/2017   Procedure: ACHILLES LENGTHENING/KIDNER/TAL/Teno achilles lengthening;  Surgeon: Samara Deist, DPM;  Location: [facility];  Service: Podiatry;  Laterality: Right;  . CARDIAC CATHETERIZATION     03/2014  . CATARACT EXTRACTION    . CORONARY ARTERY BYPASS GRAFT N/A 05/07/2014   Procedure: CORONARY ARTERY BYPASS GRAFTING (CABG);  Surgeon: Gaye Pollack, MD;  Location: [facility];  Service: Open Heart Surgery;  Laterality: N/A;  Times 4 using left internal mammary artery and endoscopically harvested right saphenous vein  . DIALYSIS/PERMA CATHETER INSERTION N/A 08/04/2019   Procedure: DIALYSIS/PERMA CATHETER INSERTION;  Surgeon: Katha Cabal, MD;  Location: [facility];  Service: Cardiovascular;  Laterality: N/A;  . DIALYSIS/PERMA CATHETER REMOVAL N/A 08/08/2019   Procedure: DIALYSIS/PERMA CATHETER REMOVAL;  Surgeon: Katha Cabal, MD;  Location: [facility] [facility];  Service: Cardiovascular;  Laterality: N/A;  . ICD IMPLANT N/A 10/04/2019   Procedure: ICD IMPLANT;  Surgeon: Deboraha Sprang, MD;  Location: [facility];  Service: Cardiovascular;  Laterality: N/A;  . INTRAOPERATIVE TRANSESOPHAGEAL ECHOCARDIOGRAM N/A 05/07/2014   Procedure: INTRAOPERATIVE TRANSESOPHAGEAL ECHOCARDIOGRAM;  Surgeon: Gaye Pollack, MD;  Location: [facility];  Service: Open Heart Surgery;  Laterality: N/A;  . left foot osteomyelitis  and wound after surgery    . TOE AMPUTATION     Left 3 and 4 toes  . TONSILECTOMY/ADENOIDECTOMY WITH MYRINGOTOMY        Allergies:   Unasyn [ampicillin-sulbactam sodium] and Zosyn [piperacillin sod-tazobactam so]   Social History   Tobacco Use  . Smoking status: Never Smoker  . Smokeless tobacco: Never Used  Substance Use Topics  . Alcohol use: No  . Drug use: No     Current Outpatient Medications on File Prior to Visit  Medication Sig Dispense Refill  . acetaminophen (TYLENOL) 325 MG tablet Take by mouth every 6 (six) hours as needed for mild pain or moderate pain.    Marland Kitchen allopurinol (ZYLOPRIM) 100 MG tablet Take 100 mg by mouth daily.    Marland Kitchen amLODipine (NORVASC) 2.5 MG tablet Take 2.5 mg by mouth daily.     Marland Kitchen aspirin EC 81 MG tablet Take 81 mg by mouth daily.    Marland Kitchen atorvastatin (LIPITOR) 40 MG tablet TAKE 1 TABLET AT BEDTIME 90 tablet  0  . carvedilol (COREG) 6.25 MG tablet TAKE 1 TABLET TWICE DAILY 180 tablet 3  . Cyanocobalamin (VITAMIN B 12 PO) Take 1,000 mcg by mouth at bedtime.     . ferrous sulfate 325 (65 FE) MG tablet Take 325 mg by mouth daily.    Marland Kitchen glimepiride (AMARYL) 2 MG tablet Take 2 mg by mouth daily with breakfast.     . insulin aspart (NOVOLOG) 100 UNIT/ML injection Inject 0-9 Units into the skin 3 (three) times daily with meals. (Patient taking differently: Inject 0-9 Units into the skin 3 (three) times daily with meals. Sliding Scale) 10 mL 11  . isosorbide mononitrate (IMDUR) 30 MG 24 hr tablet Take 1 tablet (30 mg) by mouth once daily (Patient taking differently: Take 30 mg by mouth daily. )    . Multiple Vitamin (MULTIVITAMIN) capsule Take 1 capsule by mouth daily.    . nitroGLYCERIN (NITROSTAT) 0.4 MG SL tablet Place 1 tablet (0.4 mg total) under the tongue every 5 (five) minutes as needed for chest pain. 30 tablet 1  . sodium bicarbonate 650 MG tablet Take 1,300 mg by mouth 2 (two) times daily.     Marland Kitchen torsemide (DEMADEX) 20 MG tablet Take 40 mg by mouth daily.      Marland Kitchen LEVEMIR FLEXTOUCH 100 UNIT/ML FlexPen      No current facility-administered medications on file prior to visit.     Family Hx: The patient's family history includes Diabetes type II in an other family member; Heart disease in his father; Hypertension in an other family member; Kidney disease in an other family member; Ovarian cancer in his mother.  ROS:   Please see the history of present illness.    Review of Systems  Constitutional: Negative.        Weight gain  HENT: Negative.   Respiratory: Negative.   Cardiovascular: Negative.   Gastrointestinal: Negative.   Musculoskeletal: Negative.   Neurological: Negative.   Psychiatric/Behavioral: Negative.   All other systems reviewed and are negative.    Labs/Other Tests and Data Reviewed:    Recent Labs: 08/14/2019: B Natriuretic Peptide 2,227.0; Magnesium 2.2 08/21/2019: ALT 24; TSH 1.813 09/29/2019: BUN 84; Creatinine, Ser 3.88; Hemoglobin 11.8; Platelets 228; Potassium 5.1; Sodium 135   Recent Lipid Panel No results found for: CHOL, TRIG, HDL, CHOLHDL, LDLCALC, LDLDIRECT  Wt Readings from Last 3 Encounters:  03/19/20 264 lb 8 oz (120 kg)  01/09/20 254 lb 8 oz (115.4 kg)  10/04/19 241 lb (109.3 kg)     Exam:    Vital Signs: Vital signs may also be detailed in the HPI BP 130/80 (BP Location: Left Arm, Patient Position: Sitting, Cuff Size: Normal)   Pulse 73   Ht 6\' 1"  (1.854 m)   Wt 264 lb 8 oz (120 kg)   SpO2 98%   BMI 34.90 kg/m   Constitutional:  oriented to person, place, and time. No distress.  HENT:  Head: Grossly normal Eyes:  no discharge. No scleral icterus.  Neck: No JVD, no carotid bruits  Cardiovascular: Regular rate and rhythm, no murmurs appreciated Pulmonary/Chest: Clear to auscultation bilaterally, no wheezes or rails Abdominal: Soft.  no distension.  no tenderness.  Musculoskeletal: Normal range of motion Neurological:  normal muscle tone. Coordination normal. No atrophy Skin: Skin warm and  dry Psychiatric: normal affect, pleasant  ASSESSMENT & PLAN:    Problem List Items Addressed This Visit      Cardiology Problems   NSVT (nonsustained ventricular tachycardia) (HCC)  Relevant Medications   amLODipine (NORVASC) 2.5 MG tablet   torsemide (DEMADEX) 20 MG tablet   Chronic systolic heart failure (HCC) (Chronic)   Relevant Medications   amLODipine (NORVASC) 2.5 MG tablet   torsemide (DEMADEX) 20 MG tablet   CAD (coronary artery disease) - Primary   Relevant Medications   amLODipine (NORVASC) 2.5 MG tablet   torsemide (DEMADEX) 20 MG tablet   Other Relevant Orders   EKG 12-Lead   Ischemic cardiomyopathy   Relevant Medications   amLODipine (NORVASC) 2.5 MG tablet   torsemide (DEMADEX) 20 MG tablet   Other Relevant Orders   EKG 12-Lead   Orthostatic hypotension   Relevant Medications   amLODipine (NORVASC) 2.5 MG tablet   torsemide (DEMADEX) 20 MG tablet    Other Visit Diagnoses    ICD (implantable cardioverter-defibrillator) in place       Relevant Orders   EKG 12-Lead     Ischemic cardiomyopathy coronary disease, CABG, large region of infarct noted on prior stress testing, Long history nonsustained VT Has ICD Select medications held such as ACE inhibitor, ARB, Entresto, spironolactone given renal failure creatinine 3.5 up to 4 --Recommend he hold amlodipine, increase isosorbide back to 30 mg daily (currently taking only 15 mg)  Poor control diabetes type 2 with complications Recommended strict diet He has had 20 pound weight gain in the past 6 months  Ventricular tachycardia Has ICD, followed by EP Denies any recent events  Disposition: Follow-up in 6 months   Signed, Ida Rogue, MD  [place] [facility] [address] [place] [address], [place], [place] [address]

## 2020-03-19 ENCOUNTER — Telehealth: Payer: Self-pay | Admitting: Cardiovascular Disease

## 2020-03-19 ENCOUNTER — Other Ambulatory Visit: Payer: Self-pay

## 2020-03-19 ENCOUNTER — Ambulatory Visit: Payer: Medicare HMO | Admitting: Cardiovascular Disease

## 2020-03-19 ENCOUNTER — Encounter: Payer: Self-pay | Admitting: Cardiovascular Disease

## 2020-03-19 VITALS — BP 130/80 | HR 73 | Ht 73.0 in | Wt 264.5 lb

## 2020-03-19 DIAGNOSIS — I255 Ischemic cardiomyopathy: Secondary | ICD-10-CM | POA: Diagnosis not present

## 2020-03-19 DIAGNOSIS — I951 Orthostatic hypotension: Secondary | ICD-10-CM | POA: Diagnosis not present

## 2020-03-19 DIAGNOSIS — I25118 Atherosclerotic heart disease of native coronary artery with other forms of angina pectoris: Secondary | ICD-10-CM

## 2020-03-19 DIAGNOSIS — I472 Ventricular tachycardia: Secondary | ICD-10-CM

## 2020-03-19 DIAGNOSIS — I4729 Other ventricular tachycardia: Secondary | ICD-10-CM

## 2020-03-19 DIAGNOSIS — I5022 Chronic systolic (congestive) heart failure: Secondary | ICD-10-CM

## 2020-03-19 DIAGNOSIS — Z95810 Presence of automatic (implantable) cardiac defibrillator: Secondary | ICD-10-CM

## 2020-03-19 MED ORDER — ISOSORBIDE MONONITRATE ER 60 MG PO TB24
60.0000 mg | ORAL_TABLET | Freq: Every day | ORAL | 3 refills | Status: DC
Start: 2020-03-19 — End: 2020-10-01

## 2020-03-19 MED ORDER — ISOSORBIDE MONONITRATE ER 30 MG PO TB24: 30.0000 mg | ORAL_TABLET | Freq: Every day | ORAL | 3 refills | Status: DC

## 2020-03-19 NOTE — Telephone Encounter (Signed)
Spoke with patient and he got home and needed clarification. He was on isosorbide mononitrate 60 mg taking 1/2 tablet (30 mg) once daily. He wanted to clarify if he wanted him to increase to 60 mg once daily. Advised that I would check with provider and give him a call back. He verbalized understanding with no further questions at this time.

## 2020-03-19 NOTE — Telephone Encounter (Signed)
Patient calling back to clarify the correct dosage of isosorbide mononitrate. Patient is currently taking 30 mg (1/2 of the 60 mg pill). Patient wants clarification from discussion with [facility] regarding taking 60 mg vs 30 mg

## 2020-03-19 NOTE — Telephone Encounter (Signed)
Clarified with Dr. Rockey Situ and he recommended that he increase to isosorbide mononitrate 60 mg once daily. New prescription sent into pharmacy and he verbalized understanding with no further questions at this time.

## 2020-03-19 NOTE — Patient Instructions (Addendum)
Medication Instructions:  Stop the amlodipine Increase the imdur/isosorbide up to 60 mg daily (from a half)  If you need a refill on your cardiac medications before your next appointment, please call your pharmacy.   Lab work: No new labs needed   If you have labs (blood work) drawn today and your tests are completely normal, you will receive your results only by: Marland Kitchen MyChart Message (if you have MyChart) OR . A paper copy in the mail If you have any lab test that is abnormal or we need to change your treatment, we will call you to review the results.   Testing/Procedures: No new testing needed   Follow-Up: At [facility], you and your health needs are our priority.  As part of our continuing mission to provide you with exceptional heart care, we have created designated Provider Care Teams.  These Care Teams include your primary Cardiologist (physician) and Advanced Practice Providers (APPs -  Physician Assistants and Nurse Practitioners) who all work together to provide you with the care you need, when you need it.  . You will need a follow up appointment in 6 months   . Providers on your designated Care Team:   . Murray Hodgkins, NP . Christell Faith, PA-C . Marrianne Mood, PA-C  Any Other Special Instructions Will Be Listed Below (If Applicable).  For educational health videos Log in to : [email or web address] Or : [email or web address], password : [email or web address]

## 2020-04-04 ENCOUNTER — Ambulatory Visit (INDEPENDENT_AMBULATORY_CARE_PROVIDER_SITE_OTHER): Payer: Medicare HMO | Admitting: *Deleted

## 2020-04-04 DIAGNOSIS — I255 Ischemic cardiomyopathy: Secondary | ICD-10-CM

## 2020-04-04 DIAGNOSIS — Z794 Long term (current) use of insulin: Secondary | ICD-10-CM | POA: Diagnosis not present

## 2020-04-04 DIAGNOSIS — E1140 Type 2 diabetes mellitus with diabetic neuropathy, unspecified: Secondary | ICD-10-CM | POA: Diagnosis not present

## 2020-04-04 DIAGNOSIS — E1165 Type 2 diabetes mellitus with hyperglycemia: Secondary | ICD-10-CM | POA: Diagnosis not present

## 2020-04-04 DIAGNOSIS — L97511 Non-pressure chronic ulcer of other part of right foot limited to breakdown of skin: Secondary | ICD-10-CM | POA: Diagnosis not present

## 2020-04-04 DIAGNOSIS — E11621 Type 2 diabetes mellitus with foot ulcer: Secondary | ICD-10-CM | POA: Diagnosis not present

## 2020-04-04 DIAGNOSIS — L97509 Non-pressure chronic ulcer of other part of unspecified foot with unspecified severity: Secondary | ICD-10-CM | POA: Diagnosis not present

## 2020-04-04 DIAGNOSIS — Z89432 Acquired absence of left foot: Secondary | ICD-10-CM | POA: Diagnosis not present

## 2020-04-04 LAB — CUP PACEART REMOTE DEVICE CHECK
Battery Remaining Longevity: 105 mo
Battery Remaining Percentage: 94 %
Battery Voltage: 3.01 V
Brady Statistic RV Percent Paced: 0 %
Date Time Interrogation Session: 20210520021100
HighPow Impedance: 79 Ohm
Implantable Lead Implant Date: 20201118
Implantable Lead Location: 753860
Implantable Pulse Generator Implant Date: 20201118
Lead Channel Impedance Value: 440 Ohm
Lead Channel Pacing Threshold Amplitude: 0.5 V
Lead Channel Pacing Threshold Pulse Width: 0.3 ms
Lead Channel Sensing Intrinsic Amplitude: 11.4 mV
Lead Channel Setting Pacing Amplitude: 2.5 V
Lead Channel Setting Pacing Pulse Width: 0.3 ms
Lead Channel Setting Sensing Sensitivity: 0.5 mV
Pulse Gen Serial Number: 111013097

## 2020-04-08 NOTE — Progress Notes (Signed)
Remote ICD transmission.   

## 2020-05-01 ENCOUNTER — Other Ambulatory Visit: Payer: Self-pay | Admitting: *Deleted

## 2020-05-01 MED ORDER — ATORVASTATIN CALCIUM 40 MG PO TABS: 40.0000 mg | ORAL_TABLET | Freq: Every day | ORAL | 0 refills | Status: DC

## 2020-05-23 ENCOUNTER — Telehealth: Payer: Self-pay

## 2020-05-24 ENCOUNTER — Other Ambulatory Visit: Payer: Self-pay

## 2020-05-24 NOTE — Patient Outreach (Signed)
[facility]) Care Management  05/24/2020  [name] [name] Nov 11, 1961 [id number]   Referral Date: 05/24/20 Referral Source:EMMI Join Campaign Referral Reason: Join EMMI   Outreach Attempt: Telephone call to patient for introductory call.  HIPAA compliant message left.     Plan: RN CM will attempt again in the month of July and send letter.   [name], RN, MSN [facility] Care Management Care Management Coordinator Direct Line [phone] Toll Free: [phone]  Fax: [phone]

## 2020-06-05 ENCOUNTER — Other Ambulatory Visit: Payer: Self-pay

## 2020-06-05 NOTE — Patient Outreach (Signed)
[facility]) Care Management  06/05/2020  SIGFREDO SCHREIER [birth year] [id number]   Referral Date: 05/24/20 Referral Source:EMMI Join Campaign Referral Reason: Join EMMI   Outreach Attempt: Telephone call to patient for introductory call.  HIPAA compliant message left.     Plan: RN CM will attempt again in the month of July.  [name], RN, MSN [place] Management Care Management Coordinator Direct Line [phone] Cell [phone] Toll Free: [phone]  Fax: [phone]

## 2020-06-06 ENCOUNTER — Other Ambulatory Visit: Payer: Self-pay

## 2020-06-06 NOTE — Patient Outreach (Signed)
[facility]) Care Management  06/06/2020  RUBERT FREDIANI 1961/07/07 [id number]   Referral Date:05/24/20 Referral Source:EMMI Join Campaign Referral Reason:Join EMMI   Outreach Attempt:Telephone call to patient for introductory call. HIPAA compliant message left.    Plan:RN CM will attempt again for 4th attempt.  [name], RN, MSN [place] Management Care Management Coordinator Direct Line [phone] Cell [phone] Toll Free: [phone]  Fax: [phone]

## 2020-06-12 DIAGNOSIS — N184 Chronic kidney disease, stage 4 (severe): Secondary | ICD-10-CM | POA: Diagnosis not present

## 2020-06-13 ENCOUNTER — Other Ambulatory Visit: Payer: Self-pay

## 2020-06-13 NOTE — Patient Outreach (Signed)
[facility]) Care Management  06/13/2020  [name] [name] Jan 28, 1961 [id number]   Referral Date:05/24/20 Referral Source:EMMI Join Campaign Referral Reason:Join EMMI   Outreach Attempt:Telephone call to patient for introductory call. HIPAA compliant message left.    Plan:RN CM will close case.  [name], RN, MSN [place] Management Care Management Coordinator Direct Line [phone] Cell [phone] Toll Free: [phone]  Fax: [phone]

## 2020-06-18 DIAGNOSIS — I255 Ischemic cardiomyopathy: Secondary | ICD-10-CM | POA: Diagnosis not present

## 2020-06-18 DIAGNOSIS — Z89422 Acquired absence of other left toe(s): Secondary | ICD-10-CM | POA: Diagnosis not present

## 2020-06-18 DIAGNOSIS — I120 Hypertensive chronic kidney disease with stage 5 chronic kidney disease or end stage renal disease: Secondary | ICD-10-CM | POA: Diagnosis not present

## 2020-06-18 DIAGNOSIS — I25810 Atherosclerosis of coronary artery bypass graft(s) without angina pectoris: Secondary | ICD-10-CM | POA: Diagnosis not present

## 2020-06-18 DIAGNOSIS — N185 Chronic kidney disease, stage 5: Secondary | ICD-10-CM | POA: Diagnosis not present

## 2020-06-18 DIAGNOSIS — Z6834 Body mass index (BMI) 34.0-34.9, adult: Secondary | ICD-10-CM | POA: Diagnosis not present

## 2020-06-18 DIAGNOSIS — Z794 Long term (current) use of insulin: Secondary | ICD-10-CM | POA: Diagnosis not present

## 2020-06-18 DIAGNOSIS — Z951 Presence of aortocoronary bypass graft: Secondary | ICD-10-CM | POA: Diagnosis not present

## 2020-06-18 DIAGNOSIS — E1122 Type 2 diabetes mellitus with diabetic chronic kidney disease: Secondary | ICD-10-CM | POA: Diagnosis not present

## 2020-06-20 DIAGNOSIS — R808 Other proteinuria: Secondary | ICD-10-CM | POA: Diagnosis not present

## 2020-06-20 DIAGNOSIS — N2581 Secondary hyperparathyroidism of renal origin: Secondary | ICD-10-CM | POA: Diagnosis not present

## 2020-06-20 DIAGNOSIS — E790 Hyperuricemia without signs of inflammatory arthritis and tophaceous disease: Secondary | ICD-10-CM | POA: Diagnosis not present

## 2020-06-20 DIAGNOSIS — N184 Chronic kidney disease, stage 4 (severe): Secondary | ICD-10-CM | POA: Diagnosis not present

## 2020-06-20 DIAGNOSIS — I10 Essential (primary) hypertension: Secondary | ICD-10-CM | POA: Diagnosis not present

## 2020-06-20 DIAGNOSIS — E1129 Type 2 diabetes mellitus with other diabetic kidney complication: Secondary | ICD-10-CM | POA: Diagnosis not present

## 2020-06-20 DIAGNOSIS — I951 Orthostatic hypotension: Secondary | ICD-10-CM | POA: Diagnosis not present

## 2020-07-04 ENCOUNTER — Ambulatory Visit (INDEPENDENT_AMBULATORY_CARE_PROVIDER_SITE_OTHER): Payer: Medicare HMO | Admitting: *Deleted

## 2020-07-04 DIAGNOSIS — I255 Ischemic cardiomyopathy: Secondary | ICD-10-CM

## 2020-07-04 LAB — CUP PACEART REMOTE DEVICE CHECK
Battery Remaining Longevity: 101 mo
Battery Remaining Percentage: 91 %
Battery Voltage: 3.01 V
Brady Statistic RV Percent Paced: 0 %
Date Time Interrogation Session: 20210819020259
HighPow Impedance: 75 Ohm
Implantable Lead Implant Date: 20201118
Implantable Lead Location: 753860
Implantable Pulse Generator Implant Date: 20201118
Lead Channel Impedance Value: 410 Ohm
Lead Channel Pacing Threshold Amplitude: 0.5 V
Lead Channel Pacing Threshold Pulse Width: 0.3 ms
Lead Channel Sensing Intrinsic Amplitude: 11.4 mV
Lead Channel Setting Pacing Amplitude: 2.5 V
Lead Channel Setting Pacing Pulse Width: 0.3 ms
Lead Channel Setting Sensing Sensitivity: 0.5 mV
Pulse Gen Serial Number: 111013097

## 2020-07-08 NOTE — Progress Notes (Signed)
Remote ICD transmission.   

## 2020-07-11 DIAGNOSIS — E113513 Type 2 diabetes mellitus with proliferative diabetic retinopathy with macular edema, bilateral: Secondary | ICD-10-CM | POA: Diagnosis not present

## 2020-07-11 DIAGNOSIS — E1139 Type 2 diabetes mellitus with other diabetic ophthalmic complication: Secondary | ICD-10-CM | POA: Diagnosis not present

## 2020-07-12 DIAGNOSIS — I10 Essential (primary) hypertension: Secondary | ICD-10-CM | POA: Diagnosis not present

## 2020-07-12 DIAGNOSIS — E1165 Type 2 diabetes mellitus with hyperglycemia: Secondary | ICD-10-CM | POA: Diagnosis not present

## 2020-07-12 DIAGNOSIS — E782 Mixed hyperlipidemia: Secondary | ICD-10-CM | POA: Diagnosis not present

## 2020-07-12 DIAGNOSIS — N185 Chronic kidney disease, stage 5: Secondary | ICD-10-CM | POA: Diagnosis not present

## 2020-07-12 DIAGNOSIS — Z794 Long term (current) use of insulin: Secondary | ICD-10-CM | POA: Diagnosis not present

## 2020-07-12 DIAGNOSIS — E1140 Type 2 diabetes mellitus with diabetic neuropathy, unspecified: Secondary | ICD-10-CM | POA: Diagnosis not present

## 2020-07-12 DIAGNOSIS — E1122 Type 2 diabetes mellitus with diabetic chronic kidney disease: Secondary | ICD-10-CM | POA: Diagnosis not present

## 2020-07-17 DIAGNOSIS — E113513 Type 2 diabetes mellitus with proliferative diabetic retinopathy with macular edema, bilateral: Secondary | ICD-10-CM | POA: Diagnosis not present

## 2020-07-23 DIAGNOSIS — E1165 Type 2 diabetes mellitus with hyperglycemia: Secondary | ICD-10-CM | POA: Diagnosis not present

## 2020-07-23 DIAGNOSIS — Z794 Long term (current) use of insulin: Secondary | ICD-10-CM | POA: Diagnosis not present

## 2020-07-23 DIAGNOSIS — Z79899 Other long term (current) drug therapy: Secondary | ICD-10-CM | POA: Diagnosis not present

## 2020-07-30 DIAGNOSIS — N184 Chronic kidney disease, stage 4 (severe): Secondary | ICD-10-CM | POA: Diagnosis not present

## 2020-07-30 DIAGNOSIS — E1129 Type 2 diabetes mellitus with other diabetic kidney complication: Secondary | ICD-10-CM | POA: Diagnosis not present

## 2020-07-31 ENCOUNTER — Other Ambulatory Visit: Payer: Self-pay | Admitting: Cardiovascular Disease

## 2020-08-01 DIAGNOSIS — E113513 Type 2 diabetes mellitus with proliferative diabetic retinopathy with macular edema, bilateral: Secondary | ICD-10-CM | POA: Diagnosis not present

## 2020-08-06 ENCOUNTER — Other Ambulatory Visit: Payer: Self-pay | Admitting: Nephrology

## 2020-08-06 DIAGNOSIS — I10 Essential (primary) hypertension: Secondary | ICD-10-CM | POA: Diagnosis not present

## 2020-08-06 DIAGNOSIS — N2581 Secondary hyperparathyroidism of renal origin: Secondary | ICD-10-CM | POA: Diagnosis not present

## 2020-08-06 DIAGNOSIS — E790 Hyperuricemia without signs of inflammatory arthritis and tophaceous disease: Secondary | ICD-10-CM | POA: Diagnosis not present

## 2020-08-06 DIAGNOSIS — N184 Chronic kidney disease, stage 4 (severe): Secondary | ICD-10-CM | POA: Diagnosis not present

## 2020-08-06 DIAGNOSIS — R808 Other proteinuria: Secondary | ICD-10-CM | POA: Diagnosis not present

## 2020-08-06 DIAGNOSIS — E1129 Type 2 diabetes mellitus with other diabetic kidney complication: Secondary | ICD-10-CM | POA: Diagnosis not present

## 2020-08-06 DIAGNOSIS — N185 Chronic kidney disease, stage 5: Secondary | ICD-10-CM

## 2020-08-06 DIAGNOSIS — I951 Orthostatic hypotension: Secondary | ICD-10-CM | POA: Diagnosis not present

## 2020-08-06 DIAGNOSIS — E875 Hyperkalemia: Secondary | ICD-10-CM | POA: Diagnosis not present

## 2020-08-09 ENCOUNTER — Ambulatory Visit
Admission: RE | Admit: 2020-08-09 | Discharge: 2020-08-09 | Disposition: A | Discharge status: Home / Self Care | Payer: Medicare HMO | Source: Ambulatory Visit | Attending: Nephrology | Admitting: Nephrology

## 2020-08-09 ENCOUNTER — Ambulatory Visit
Admission: RE | Admit: 2020-08-09 | Discharge: 2020-08-09 | Disposition: A | Discharge status: Home / Self Care | Payer: Medicare HMO | Attending: Nephrology | Admitting: Nephrology

## 2020-08-09 DIAGNOSIS — N189 Chronic kidney disease, unspecified: Secondary | ICD-10-CM | POA: Diagnosis not present

## 2020-08-09 DIAGNOSIS — N185 Chronic kidney disease, stage 5: Secondary | ICD-10-CM

## 2020-08-09 DIAGNOSIS — N19 Unspecified kidney failure: Secondary | ICD-10-CM | POA: Diagnosis not present

## 2020-08-14 DIAGNOSIS — E113513 Type 2 diabetes mellitus with proliferative diabetic retinopathy with macular edema, bilateral: Secondary | ICD-10-CM | POA: Diagnosis not present

## 2020-08-16 ENCOUNTER — Other Ambulatory Visit: Payer: Self-pay

## 2020-08-16 DIAGNOSIS — E1169 Type 2 diabetes mellitus with other specified complication: Principal | ICD-10-CM | POA: Diagnosis present

## 2020-08-16 DIAGNOSIS — I5023 Acute on chronic systolic (congestive) heart failure: Secondary | ICD-10-CM | POA: Diagnosis not present

## 2020-08-16 DIAGNOSIS — E118 Type 2 diabetes mellitus with unspecified complications: Secondary | ICD-10-CM | POA: Diagnosis not present

## 2020-08-16 DIAGNOSIS — I5022 Chronic systolic (congestive) heart failure: Secondary | ICD-10-CM | POA: Diagnosis present

## 2020-08-16 DIAGNOSIS — E1140 Type 2 diabetes mellitus with diabetic neuropathy, unspecified: Secondary | ICD-10-CM | POA: Diagnosis not present

## 2020-08-16 DIAGNOSIS — E11621 Type 2 diabetes mellitus with foot ulcer: Secondary | ICD-10-CM | POA: Diagnosis present

## 2020-08-16 DIAGNOSIS — E1142 Type 2 diabetes mellitus with diabetic polyneuropathy: Secondary | ICD-10-CM | POA: Diagnosis present

## 2020-08-16 DIAGNOSIS — N189 Chronic kidney disease, unspecified: Secondary | ICD-10-CM | POA: Diagnosis not present

## 2020-08-16 DIAGNOSIS — N184 Chronic kidney disease, stage 4 (severe): Secondary | ICD-10-CM | POA: Diagnosis present

## 2020-08-16 DIAGNOSIS — E785 Hyperlipidemia, unspecified: Secondary | ICD-10-CM | POA: Diagnosis present

## 2020-08-16 DIAGNOSIS — E7800 Pure hypercholesterolemia, unspecified: Secondary | ICD-10-CM | POA: Diagnosis present

## 2020-08-16 DIAGNOSIS — Z7682 Awaiting organ transplant status: Secondary | ICD-10-CM

## 2020-08-16 DIAGNOSIS — L03115 Cellulitis of right lower limb: Secondary | ICD-10-CM | POA: Diagnosis present

## 2020-08-16 DIAGNOSIS — L84 Corns and callosities: Secondary | ICD-10-CM | POA: Diagnosis not present

## 2020-08-16 DIAGNOSIS — Z20822 Contact with and (suspected) exposure to covid-19: Secondary | ICD-10-CM | POA: Diagnosis present

## 2020-08-16 DIAGNOSIS — I255 Ischemic cardiomyopathy: Secondary | ICD-10-CM | POA: Diagnosis present

## 2020-08-16 DIAGNOSIS — M2041 Other hammer toe(s) (acquired), right foot: Secondary | ICD-10-CM | POA: Diagnosis not present

## 2020-08-16 DIAGNOSIS — Z9119 Patient's noncompliance with other medical treatment and regimen: Secondary | ICD-10-CM | POA: Diagnosis not present

## 2020-08-16 DIAGNOSIS — S98131A Complete traumatic amputation of one right lesser toe, initial encounter: Secondary | ICD-10-CM | POA: Diagnosis not present

## 2020-08-16 DIAGNOSIS — N179 Acute kidney failure, unspecified: Secondary | ICD-10-CM | POA: Diagnosis present

## 2020-08-16 DIAGNOSIS — E1165 Type 2 diabetes mellitus with hyperglycemia: Secondary | ICD-10-CM | POA: Diagnosis present

## 2020-08-16 DIAGNOSIS — E1122 Type 2 diabetes mellitus with diabetic chronic kidney disease: Secondary | ICD-10-CM | POA: Diagnosis present

## 2020-08-16 DIAGNOSIS — L97519 Non-pressure chronic ulcer of other part of right foot with unspecified severity: Secondary | ICD-10-CM | POA: Diagnosis present

## 2020-08-16 DIAGNOSIS — I252 Old myocardial infarction: Secondary | ICD-10-CM | POA: Diagnosis not present

## 2020-08-16 DIAGNOSIS — L97514 Non-pressure chronic ulcer of other part of right foot with necrosis of bone: Secondary | ICD-10-CM | POA: Diagnosis not present

## 2020-08-16 DIAGNOSIS — M86171 Other acute osteomyelitis, right ankle and foot: Secondary | ICD-10-CM | POA: Diagnosis present

## 2020-08-16 DIAGNOSIS — I739 Peripheral vascular disease, unspecified: Secondary | ICD-10-CM | POA: Diagnosis present

## 2020-08-16 DIAGNOSIS — I2510 Atherosclerotic heart disease of native coronary artery without angina pectoris: Secondary | ICD-10-CM | POA: Diagnosis present

## 2020-08-16 DIAGNOSIS — R600 Localized edema: Secondary | ICD-10-CM | POA: Diagnosis not present

## 2020-08-16 DIAGNOSIS — M25474 Effusion, right foot: Secondary | ICD-10-CM | POA: Diagnosis not present

## 2020-08-16 DIAGNOSIS — M87874 Other osteonecrosis, right foot: Secondary | ICD-10-CM | POA: Diagnosis not present

## 2020-08-16 DIAGNOSIS — D631 Anemia in chronic kidney disease: Secondary | ICD-10-CM | POA: Diagnosis present

## 2020-08-16 DIAGNOSIS — Z950 Presence of cardiac pacemaker: Secondary | ICD-10-CM

## 2020-08-16 DIAGNOSIS — I130 Hypertensive heart and chronic kidney disease with heart failure and stage 1 through stage 4 chronic kidney disease, or unspecified chronic kidney disease: Secondary | ICD-10-CM | POA: Diagnosis present

## 2020-08-16 DIAGNOSIS — I10 Essential (primary) hypertension: Secondary | ICD-10-CM | POA: Diagnosis not present

## 2020-08-16 DIAGNOSIS — Z79899 Other long term (current) drug therapy: Secondary | ICD-10-CM

## 2020-08-16 DIAGNOSIS — Z794 Long term (current) use of insulin: Secondary | ICD-10-CM | POA: Diagnosis not present

## 2020-08-16 DIAGNOSIS — Z951 Presence of aortocoronary bypass graft: Secondary | ICD-10-CM | POA: Diagnosis not present

## 2020-08-16 DIAGNOSIS — Z7982 Long term (current) use of aspirin: Secondary | ICD-10-CM | POA: Diagnosis not present

## 2020-08-16 DIAGNOSIS — I5043 Acute on chronic combined systolic (congestive) and diastolic (congestive) heart failure: Secondary | ICD-10-CM | POA: Diagnosis not present

## 2020-08-16 DIAGNOSIS — E1151 Type 2 diabetes mellitus with diabetic peripheral angiopathy without gangrene: Secondary | ICD-10-CM | POA: Diagnosis present

## 2020-08-16 LAB — COMPREHENSIVE METABOLIC PANEL
ALT: 17 U/L (ref 0–44)
AST: 21 U/L (ref 15–41)
Albumin: 3.8 g/dL (ref 3.5–5.0)
Alkaline Phosphatase: 64 U/L (ref 38–126)
Anion gap: 13 (ref 5–15)
BUN: 83 mg/dL — ABNORMAL HIGH (ref 6–20)
CO2: 23 mmol/L (ref 22–32)
Calcium: 8.7 mg/dL — ABNORMAL LOW (ref 8.9–10.3)
Chloride: 102 mmol/L (ref 98–111)
Creatinine, Ser: 3.63 mg/dL — ABNORMAL HIGH (ref 0.61–1.24)
GFR calc Af Amer: 20 mL/min — ABNORMAL LOW (ref >60)
GFR calc non Af Amer: 18 mL/min — ABNORMAL LOW (ref >60)
Glucose, Bld: 194 mg/dL — ABNORMAL HIGH (ref 70–99)
Potassium: 5 mmol/L (ref 3.5–5.1)
Sodium: 138 mmol/L (ref 135–145)
Total Bilirubin: 0.8 mg/dL (ref 0.3–1.2)
Total Protein: 7.9 g/dL (ref 6.5–8.1)

## 2020-08-16 LAB — CBC WITH DIFFERENTIAL/PLATELET
Abs Immature Granulocytes: 0.04 10*3/uL (ref 0.00–0.07)
Basophils Absolute: 0.1 10*3/uL (ref 0.0–0.1)
Basophils Relative: 1 %
Eosinophils Absolute: 0.2 10*3/uL (ref 0.0–0.5)
Eosinophils Relative: 2 %
HCT: 36.2 % — ABNORMAL LOW (ref 39.0–52.0)
Hemoglobin: 11.9 g/dL — ABNORMAL LOW (ref 13.0–17.0)
Immature Granulocytes: 0 %
Lymphocytes Relative: 16 %
Lymphs Abs: 1.8 10*3/uL (ref 0.7–4.0)
MCH: 31.6 pg (ref 26.0–34.0)
MCHC: 32.9 g/dL (ref 30.0–36.0)
MCV: 96.3 fL (ref 80.0–100.0)
Monocytes Absolute: 0.9 10*3/uL (ref 0.1–1.0)
Monocytes Relative: 9 %
Neutro Abs: 7.8 10*3/uL — ABNORMAL HIGH (ref 1.7–7.7)
Neutrophils Relative %: 72 %
Platelets: 194 10*3/uL (ref 150–400)
RBC: 3.76 MIL/uL — ABNORMAL LOW (ref 4.22–5.81)
RDW: 13.3 % (ref 11.5–15.5)
WBC: 10.8 10*3/uL — ABNORMAL HIGH (ref 4.0–10.5)
nRBC: 0 % (ref 0.0–0.2)

## 2020-08-16 NOTE — ED Triage Notes (Addendum)
Pt states being sent in from [facility] to be admitted for IV antibiotics for an infection to the right little toe from an ulcer. Pt states he is supposed to get surgery as well this weekend.  Pt states they tried to direct admit him, but were unable to. Pt also stated he just started sulfameth/trimethoprim 800/160mg 

## 2020-08-17 ENCOUNTER — Inpatient Hospital Stay
Admission: EM | Admit: 2020-08-17 | Discharge: 2020-08-21 | DRG: 617 | Disposition: A | Discharge status: Home / Self Care | Payer: Medicare HMO | Attending: Internal Medicine | Admitting: Internal Medicine

## 2020-08-17 ENCOUNTER — Inpatient Hospital Stay: Payer: Medicare HMO

## 2020-08-17 DIAGNOSIS — E1169 Type 2 diabetes mellitus with other specified complication: Secondary | ICD-10-CM | POA: Diagnosis present

## 2020-08-17 DIAGNOSIS — E1142 Type 2 diabetes mellitus with diabetic polyneuropathy: Secondary | ICD-10-CM | POA: Diagnosis present

## 2020-08-17 DIAGNOSIS — Z950 Presence of cardiac pacemaker: Secondary | ICD-10-CM | POA: Diagnosis not present

## 2020-08-17 DIAGNOSIS — Z20822 Contact with and (suspected) exposure to covid-19: Secondary | ICD-10-CM | POA: Diagnosis present

## 2020-08-17 DIAGNOSIS — I255 Ischemic cardiomyopathy: Secondary | ICD-10-CM | POA: Diagnosis present

## 2020-08-17 DIAGNOSIS — I252 Old myocardial infarction: Secondary | ICD-10-CM | POA: Diagnosis not present

## 2020-08-17 DIAGNOSIS — E1122 Type 2 diabetes mellitus with diabetic chronic kidney disease: Secondary | ICD-10-CM | POA: Diagnosis present

## 2020-08-17 DIAGNOSIS — Z7982 Long term (current) use of aspirin: Secondary | ICD-10-CM | POA: Diagnosis not present

## 2020-08-17 DIAGNOSIS — L97519 Non-pressure chronic ulcer of other part of right foot with unspecified severity: Secondary | ICD-10-CM | POA: Diagnosis present

## 2020-08-17 DIAGNOSIS — Z951 Presence of aortocoronary bypass graft: Secondary | ICD-10-CM | POA: Diagnosis not present

## 2020-08-17 DIAGNOSIS — S91309A Unspecified open wound, unspecified foot, initial encounter: Secondary | ICD-10-CM

## 2020-08-17 DIAGNOSIS — I130 Hypertensive heart and chronic kidney disease with heart failure and stage 1 through stage 4 chronic kidney disease, or unspecified chronic kidney disease: Secondary | ICD-10-CM | POA: Diagnosis present

## 2020-08-17 DIAGNOSIS — Z79899 Other long term (current) drug therapy: Secondary | ICD-10-CM | POA: Diagnosis not present

## 2020-08-17 DIAGNOSIS — Z9119 Patient's noncompliance with other medical treatment and regimen: Secondary | ICD-10-CM | POA: Diagnosis not present

## 2020-08-17 DIAGNOSIS — N184 Chronic kidney disease, stage 4 (severe): Secondary | ICD-10-CM | POA: Diagnosis not present

## 2020-08-17 DIAGNOSIS — E7800 Pure hypercholesterolemia, unspecified: Secondary | ICD-10-CM | POA: Diagnosis present

## 2020-08-17 DIAGNOSIS — N189 Chronic kidney disease, unspecified: Secondary | ICD-10-CM | POA: Diagnosis not present

## 2020-08-17 DIAGNOSIS — M86171 Other acute osteomyelitis, right ankle and foot: Secondary | ICD-10-CM | POA: Diagnosis present

## 2020-08-17 DIAGNOSIS — E118 Type 2 diabetes mellitus with unspecified complications: Secondary | ICD-10-CM | POA: Diagnosis present

## 2020-08-17 DIAGNOSIS — E785 Hyperlipidemia, unspecified: Secondary | ICD-10-CM | POA: Diagnosis present

## 2020-08-17 DIAGNOSIS — N179 Acute kidney failure, unspecified: Secondary | ICD-10-CM | POA: Diagnosis present

## 2020-08-17 DIAGNOSIS — I5022 Chronic systolic (congestive) heart failure: Secondary | ICD-10-CM | POA: Diagnosis present

## 2020-08-17 DIAGNOSIS — I739 Peripheral vascular disease, unspecified: Secondary | ICD-10-CM | POA: Diagnosis present

## 2020-08-17 DIAGNOSIS — Z09 Encounter for follow-up examination after completed treatment for conditions other than malignant neoplasm: Secondary | ICD-10-CM

## 2020-08-17 DIAGNOSIS — M869 Osteomyelitis, unspecified: Secondary | ICD-10-CM

## 2020-08-17 DIAGNOSIS — I5043 Acute on chronic combined systolic (congestive) and diastolic (congestive) heart failure: Secondary | ICD-10-CM | POA: Diagnosis not present

## 2020-08-17 DIAGNOSIS — I10 Essential (primary) hypertension: Secondary | ICD-10-CM | POA: Diagnosis not present

## 2020-08-17 DIAGNOSIS — E11621 Type 2 diabetes mellitus with foot ulcer: Secondary | ICD-10-CM | POA: Diagnosis present

## 2020-08-17 DIAGNOSIS — D631 Anemia in chronic kidney disease: Secondary | ICD-10-CM | POA: Diagnosis present

## 2020-08-17 DIAGNOSIS — Z794 Long term (current) use of insulin: Secondary | ICD-10-CM | POA: Diagnosis not present

## 2020-08-17 DIAGNOSIS — I2510 Atherosclerotic heart disease of native coronary artery without angina pectoris: Secondary | ICD-10-CM | POA: Diagnosis present

## 2020-08-17 DIAGNOSIS — L03115 Cellulitis of right lower limb: Secondary | ICD-10-CM | POA: Diagnosis present

## 2020-08-17 LAB — BRAIN NATRIURETIC PEPTIDE: B Natriuretic Peptide: 187.8 pg/mL — ABNORMAL HIGH (ref 0.0–100.0)

## 2020-08-17 LAB — RESPIRATORY PANEL BY RT PCR (FLU A&B, COVID)
Influenza A by PCR: NEGATIVE
Influenza B by PCR: NEGATIVE
SARS Coronavirus 2 by RT PCR: NEGATIVE

## 2020-08-17 LAB — GLUCOSE, CAPILLARY
Glucose-Capillary: 145 mg/dL — ABNORMAL HIGH (ref 70–99)
Glucose-Capillary: 221 mg/dL — ABNORMAL HIGH (ref 70–99)
Glucose-Capillary: 150 mg/dL — ABNORMAL HIGH (ref 70–99)
Glucose-Capillary: 152 mg/dL — ABNORMAL HIGH (ref 70–99)

## 2020-08-17 LAB — HEMOGLOBIN A1C
Hgb A1c MFr Bld: 8.7 % — ABNORMAL HIGH (ref 4.8–5.6)
Mean Plasma Glucose: 202.99 mg/dL

## 2020-08-17 LAB — LACTIC ACID, PLASMA: Lactic Acid, Venous: 1.7 mmol/L (ref 0.5–1.9)

## 2020-08-17 LAB — HIV ANTIBODY (ROUTINE TESTING W REFLEX): HIV Screen 4th Generation wRfx: NONREACTIVE

## 2020-08-17 MED ORDER — INSULIN ASPART 100 UNIT/ML ~~LOC~~ SOLN
0.0000 [IU] | Freq: Three times a day (TID) | SUBCUTANEOUS | Status: DC
  Administered 2020-08-17: 3 [IU] via SUBCUTANEOUS

## 2020-08-17 MED ORDER — VANCOMYCIN HCL IN DEXTROSE 1-5 GM/200ML-% IV SOLN: 1000.0000 mg | Freq: Once | INTRAVENOUS | Status: DC

## 2020-08-17 MED ORDER — VANCOMYCIN VARIABLE DOSE PER UNSTABLE RENAL FUNCTION (PHARMACIST DOSING): Status: DC

## 2020-08-17 MED ORDER — CARVEDILOL 3.125 MG PO TABS
3.1250 mg | ORAL_TABLET | Freq: Two times a day (BID) | ORAL | Status: DC
  Administered 2020-08-17 – 2020-08-21 (×8): 3.125 mg via ORAL
  Filled 2020-08-17: qty 0.5
  Filled 2020-08-17 (×4): qty 1
  Filled 2020-08-17: qty 0.5
  Filled 2020-08-17 (×7): qty 1

## 2020-08-17 MED ORDER — VANCOMYCIN HCL IN DEXTROSE 1-5 GM/200ML-% IV SOLN
1000.0000 mg | Freq: Once | INTRAVENOUS | Status: AC
  Administered 2020-08-17: 1000 mg via INTRAVENOUS
  Filled 2020-08-17: qty 200

## 2020-08-17 MED ORDER — TRAZODONE HCL 50 MG PO TABS: 25.0000 mg | ORAL_TABLET | Freq: Every evening | ORAL | Status: DC | PRN

## 2020-08-17 MED ORDER — HYDROCODONE-ACETAMINOPHEN 5-325 MG PO TABS
1.0000 | ORAL_TABLET | ORAL | Status: DC | PRN
  Administered 2020-08-20 – 2020-08-21 (×4): 1 via ORAL
  Filled 2020-08-17 (×4): qty 1

## 2020-08-17 MED ORDER — ALLOPURINOL 100 MG PO TABS
100.0000 mg | ORAL_TABLET | Freq: Every day | ORAL | Status: DC
  Administered 2020-08-17 – 2020-08-21 (×4): 100 mg via ORAL
  Filled 2020-08-17 (×4): qty 1

## 2020-08-17 MED ORDER — HEPARIN SODIUM (PORCINE) 5000 UNIT/ML IJ SOLN
5000.0000 [IU] | Freq: Three times a day (TID) | INTRAMUSCULAR | Status: DC
  Administered 2020-08-17 (×2): 5000 [IU] via SUBCUTANEOUS
  Filled 2020-08-17 (×3): qty 1

## 2020-08-17 MED ORDER — INSULIN DETEMIR 100 UNIT/ML ~~LOC~~ SOLN
10.0000 [IU] | Freq: Two times a day (BID) | SUBCUTANEOUS | Status: DC
  Administered 2020-08-17 (×2): 10 [IU] via SUBCUTANEOUS
  Filled 2020-08-17 (×5): qty 0.1

## 2020-08-17 MED ORDER — INSULIN ASPART 100 UNIT/ML ~~LOC~~ SOLN
0.0000 [IU] | Freq: Three times a day (TID) | SUBCUTANEOUS | Status: DC
  Administered 2020-08-17: 3 [IU] via SUBCUTANEOUS
  Administered 2020-08-17: 7 [IU] via SUBCUTANEOUS
  Administered 2020-08-18: 3 [IU] via SUBCUTANEOUS
  Administered 2020-08-18 – 2020-08-20 (×2): 4 [IU] via SUBCUTANEOUS
  Administered 2020-08-20: 7 [IU] via SUBCUTANEOUS
  Administered 2020-08-20 – 2020-08-21 (×3): 3 [IU] via SUBCUTANEOUS
  Filled 2020-08-17 (×10): qty 1

## 2020-08-17 MED ORDER — SODIUM CHLORIDE 0.9 % IV SOLN
2.0000 g | Freq: Once | INTRAVENOUS | Status: AC
  Administered 2020-08-17: 2 g via INTRAVENOUS
  Filled 2020-08-17: qty 2

## 2020-08-17 MED ORDER — SODIUM CHLORIDE 0.9 % IV SOLN
2.0000 g | Freq: Two times a day (BID) | INTRAVENOUS | Status: DC
  Administered 2020-08-17 – 2020-08-19 (×4): 2 g via INTRAVENOUS
  Filled 2020-08-17 (×6): qty 2

## 2020-08-17 MED ORDER — ATORVASTATIN CALCIUM 20 MG PO TABS
40.0000 mg | ORAL_TABLET | Freq: Every day | ORAL | Status: DC
  Administered 2020-08-17 – 2020-08-20 (×4): 40 mg via ORAL
  Filled 2020-08-17 (×4): qty 2

## 2020-08-17 MED ORDER — ACETAMINOPHEN 325 MG PO TABS: 650.0000 mg | ORAL_TABLET | Freq: Four times a day (QID) | ORAL | Status: DC | PRN

## 2020-08-17 MED ORDER — ISOSORBIDE MONONITRATE ER 30 MG PO TB24
60.0000 mg | ORAL_TABLET | Freq: Every day | ORAL | Status: DC
  Administered 2020-08-17 – 2020-08-21 (×4): 60 mg via ORAL
  Filled 2020-08-17: qty 1
  Filled 2020-08-17 (×2): qty 2
  Filled 2020-08-17: qty 1

## 2020-08-17 MED ORDER — ACETAMINOPHEN 650 MG RE SUPP: 650.0000 mg | Freq: Four times a day (QID) | RECTAL | Status: DC | PRN

## 2020-08-17 MED ORDER — ZOLPIDEM TARTRATE 5 MG PO TABS: 5.0000 mg | ORAL_TABLET | Freq: Every evening | ORAL | Status: DC | PRN

## 2020-08-17 MED ORDER — MAGNESIUM HYDROXIDE 400 MG/5ML PO SUSP: 30.0000 mL | Freq: Every day | ORAL | Status: DC | PRN

## 2020-08-17 MED ORDER — AMLODIPINE BESYLATE 5 MG PO TABS
2.5000 mg | ORAL_TABLET | Freq: Every day | ORAL | Status: DC
  Administered 2020-08-17 – 2020-08-21 (×5): 2.5 mg via ORAL
  Filled 2020-08-17 (×5): qty 1

## 2020-08-17 MED ORDER — INSULIN NPH (HUMAN) (ISOPHANE) 100 UNIT/ML ~~LOC~~ SUSP: 20.0000 [IU] | Freq: Two times a day (BID) | SUBCUTANEOUS | Status: DC

## 2020-08-17 MED ORDER — ONDANSETRON HCL 4 MG PO TABS: 4.0000 mg | ORAL_TABLET | Freq: Four times a day (QID) | ORAL | Status: DC | PRN

## 2020-08-17 MED ORDER — FERROUS SULFATE 325 (65 FE) MG PO TABS
325.0000 mg | ORAL_TABLET | Freq: Every day | ORAL | Status: DC
  Administered 2020-08-17 – 2020-08-21 (×4): 325 mg via ORAL
  Filled 2020-08-17 (×4): qty 1

## 2020-08-17 MED ORDER — MORPHINE SULFATE (PF) 2 MG/ML IV SOLN: 2.0000 mg | INTRAVENOUS | Status: DC | PRN

## 2020-08-17 MED ORDER — ADULT MULTIVITAMIN W/MINERALS CH
1.0000 | ORAL_TABLET | Freq: Every day | ORAL | Status: DC
  Administered 2020-08-17 – 2020-08-21 (×4): 1 via ORAL
  Filled 2020-08-17 (×4): qty 1

## 2020-08-17 MED ORDER — VANCOMYCIN HCL 1750 MG/350ML IV SOLN
1750.0000 mg | INTRAVENOUS | Status: DC
  Filled 2020-08-17: qty 350

## 2020-08-17 MED ORDER — SODIUM CHLORIDE 0.9 % IV SOLN
1.0000 g | Freq: Once | INTRAVENOUS | Status: AC
  Administered 2020-08-17: 1 g via INTRAVENOUS
  Filled 2020-08-17: qty 10

## 2020-08-17 MED ORDER — SODIUM CHLORIDE 0.9 % IV SOLN: INTRAVENOUS | Status: DC

## 2020-08-17 MED ORDER — NITROGLYCERIN 0.4 MG SL SUBL: 0.4000 mg | SUBLINGUAL_TABLET | SUBLINGUAL | Status: DC | PRN

## 2020-08-17 MED ORDER — TORSEMIDE 20 MG PO TABS
40.0000 mg | ORAL_TABLET | Freq: Every day | ORAL | Status: DC
  Administered 2020-08-17 – 2020-08-18 (×2): 40 mg via ORAL
  Filled 2020-08-17 (×3): qty 2

## 2020-08-17 MED ORDER — VITAMIN B-12 1000 MCG PO TABS
1000.0000 ug | ORAL_TABLET | Freq: Every day | ORAL | Status: DC
  Administered 2020-08-17 – 2020-08-20 (×4): 1000 ug via ORAL
  Filled 2020-08-17 (×6): qty 1

## 2020-08-17 MED ORDER — ONDANSETRON HCL 4 MG/2ML IJ SOLN: 4.0000 mg | Freq: Four times a day (QID) | INTRAMUSCULAR | Status: DC | PRN

## 2020-08-17 NOTE — Progress Notes (Signed)
Pharmacy Antibiotic Note  [name] [name] is a 59 y.o. male admitted on 08/17/2020 with osteomyelitis.  Pharmacy has been consulted for Vancomycin and Cefepime dosing.  Patient has CKD3 and stable renal function at their baseline per MD assessment. Vanc dosing calculated using normalized CrCl for patient since TBW>100kg.  Plan: Cefepime 2gm IV q12hrs Vancomycin 1750 mg IV q48h Monitor clinical picture, renal function, vanc levels if needed F/U C&S, abx de-escalation, LOT  Height: 6\' 1"  (185.4 cm) Weight: 122.5 kg (270 lb) IBW/kg (Calculated) : 79.9  Temp (24hrs), Avg:98.8 F (37.1 C), Min:98.4 F (36.9 C), Max:99.3 F (37.4 C)  Recent Labs  Lab 08/16/20 1945 08/17/20 0041  WBC 10.8*  --   CREATININE 3.63*  --   LATICACIDVEN  --  1.7    Estimated Creatinine Clearance: 30.8 mL/min (A) (by C-G formula based on SCr of 3.63 mg/dL (H)).    Allergies  Allergen Reactions  . 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 Doesn't believe he has an allergy to PCN as he has taken it previously, according to wife.  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 Doesn't believe he has an allergy to PCN as he has taken it previously, according to wife.    Antimicrobials this admission: CTX 10/2 x1 Vanc 10/2 >>  Cefepime 10/2  >>   Microbiology results: 10/2 BCx: ng<12h    Thank you for allowing pharmacy to be a part of this patient's care.  Mills Koller 08/17/2020 11:53 AM

## 2020-08-17 NOTE — H&P (Signed)
History and Physical    [name] [name] YQM:[id number] DOB: March 13, 1961 DOA: 08/17/2020  PCP: Tracie Harrier, MD (Confirm with patient/family/NH records and if not entered, this has to be entered at [facility] point of entry) Patient coming from: home  I have personally briefly reviewed patient's old medical records in [place]  Chief Complaint: osteomyelitis right foot  HPI: [name] [name] is a 59 y.o. male with medical history significant of CAD s/p CABG, chronic systolic HF, CKD III, HTN, ischemic cardiomyopathy w/ EF 30-35% Sept '15 sent in by his podiatrist for osteomyelitis.  According to the patient has been dealing with this wound for about a year.  Went to see his podiatrist today and had an x-ray consistent with acute osteomyelitis of the right metatarsal phalangeal joint.  Patient also has had worsening erythema and warmth over the last few days.  He was put on Bactrim by his podiatrist and sent over here for admission for IV antibiotics and surgical debridement today.  Patient denies fever or chills.  Does have neuropathy and therefore does not have any pain in his foot.  No nausea or vomiting.  ED Course: Tmax 98.9 155/76  HR 98. Per EDP exam nl except for wound right foot. Lab revealed Glucose 194, WBC 10.8, Hgb 11.9, Lactic acid 1.7. Covid negative. He was administered Cefipime and Vancomycin. TR[facility] is called to admit for continue abx therapy and surgical consult.   Review of Systems: As per HPI otherwise 10 point review of systems negative.    Past Medical History:  Diagnosis Date  . CAD (coronary artery disease)    a. 04/2014 CABG x 4 (LIMA->LAD, VG->Diag, VG->OM, VG->PDA).  . Chronic systolic CHF (congestive heart failure) ([place])    a. 07/2014 Echo: EF 30-35%.  . CKD (chronic kidney disease), stage III    a. 12/2015 Creat 1.8.  . Diabetic foot ulcers ([place])    a. left foot 2nd digit ant 5 th digit 05/03/14; b. 08/2014 s/p amputation of toes on L foot.  .  Hypercholesterolemia    a. 12/2015 TC 116, TG 154, HDL 24, LDL 61-->Atorvastatin 40.  Marland Kitchen Hypertension   . Hypertensive heart disease   . Ischemic cardiomyopathy    a. 07/2013 Echo: EF 20%; b. 07/2014 Echo: EF 30-35%, diff HK, Gr1 DD, mildly dil LA, nl PASP.  Marland Kitchen Myocardial infarction ([place])   . Neuropathy in diabetes ([place])   . Open wound    foot  . Orthostatic hypotension   . Peripheral vascular disease ([place])   . Type II diabetes mellitus ([place])    a. 12/2015 HbA1c = 13.9.    Past Surgical History:  Procedure Laterality Date  . ACHILLES TENDON SURGERY Right 01/22/2017   Procedure: ACHILLES LENGTHENING/KIDNER/TAL/Teno achilles lengthening;  Surgeon: Samara Deist, DPM;  Location: [facility];  Service: Podiatry;  Laterality: Right;  . CARDIAC CATHETERIZATION     03/2014  . CATARACT EXTRACTION    . CORONARY ARTERY BYPASS GRAFT N/A 05/07/2014   Procedure: CORONARY ARTERY BYPASS GRAFTING (CABG);  Surgeon: Gaye Pollack, MD;  Location: [place];  Service: Open Heart Surgery;  Laterality: N/A;  Times 4 using left internal mammary artery and endoscopically harvested right saphenous vein  . DIALYSIS/PERMA CATHETER INSERTION N/A 08/04/2019   Procedure: DIALYSIS/PERMA CATHETER INSERTION;  Surgeon: Katha Cabal, MD;  Location: [place] [facility];  Service: Cardiovascular;  Laterality: N/A;  . DIALYSIS/PERMA CATHETER REMOVAL N/A 08/08/2019   Procedure: DIALYSIS/PERMA CATHETER REMOVAL;  Surgeon: Katha Cabal,  MD;  Location: [place] [facility];  Service: Cardiovascular;  Laterality: N/A;  . ICD IMPLANT N/A 10/04/2019   Procedure: ICD IMPLANT;  Surgeon: Deboraha Sprang, MD;  Location: [place] [facility];  Service: Cardiovascular;  Laterality: N/A;  . INTRAOPERATIVE TRANSESOPHAGEAL ECHOCARDIOGRAM N/A 05/07/2014   Procedure: INTRAOPERATIVE TRANSESOPHAGEAL ECHOCARDIOGRAM;  Surgeon: Gaye Pollack, MD;  Location: [place];  Service: Open Heart Surgery;  Laterality: N/A;  . left foot osteomyelitis and wound  after surgery    . TOE AMPUTATION     Left 3 and 4 toes  . TONSILECTOMY/ADENOIDECTOMY WITH MYRINGOTOMY     Soc Hx - marriage # 1 - 8 years, #2 in 8th year. Has one daughter - 110 y/o. Disabled but worked in [employer], [employer]. Lives with wife, who is currently battling breast cancer.     reports that he has never smoked. He has never used smokeless tobacco. He reports that he does not drink alcohol and does not use drugs.  Allergies  Allergen Reactions  . 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 Doesn't believe he has an allergy to PCN as he has taken it previously, according to wife.  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 Doesn't believe he has an allergy to PCN as he has taken it previously, according to wife.    Family History  Problem Relation Age of Onset  . Heart disease Father        CABG in his 65's  . Diabetes type II Other   . Kidney disease Other   . Hypertension Other   . Ovarian cancer Mother      Prior to Admission medications   Medication Sig Start Date End Date Taking? Authorizing Provider  acetaminophen (TYLENOL) 325 MG tablet Take by mouth every 6 (six) hours as needed for mild pain or moderate pain.   Yes [provider]  allopurinol (ZYLOPRIM) 100 MG tablet Take 100 mg by mouth daily. 11/24/19  Yes [provider]  amLODipine (NORVASC) 2.5 MG tablet Take 2.5 mg by mouth daily. 06/18/20 06/18/21 Yes [provider]  aspirin EC 81 MG tablet Take 81 mg by mouth daily.   Yes [provider]  atorvastatin (LIPITOR) 40 MG tablet TAKE 1 TABLET (40 MG TOTAL) BY MOUTH AT BEDTIME. 08/01/20  Yes Gollan, Kathlene November, MD  carvedilol (COREG) 6.25 MG tablet TAKE 1 TABLET TWICE DAILY 02/20/20  Yes Deboraha Sprang, MD  Cyanocobalamin (VITAMIN B 12 PO) Take 1,000 mcg by mouth at bedtime.    Yes  [provider]  ferrous sulfate 325 (65 FE) MG tablet Take 325 mg by mouth daily.   Yes [provider]  glimepiride (AMARYL) 4 MG tablet Take 4 mg by mouth in the morning and at bedtime. 06/24/20 12/21/20 Yes [provider]  HYDROcodone-acetaminophen (NORCO/VICODIN) 5-325 MG tablet Take 1 tablet by mouth daily as needed for pain. 06/18/20  Yes [provider]  insulin aspart (NOVOLOG) 100 UNIT/ML injection Inject 0-9 Units into the skin 3 (three) times daily with meals. Patient taking differently: Inject 0-9 Units into the skin 3 (three) times daily with meals. Sliding Scale 08/08/19  Yes Fritzi Mandes, MD  insulin NPH Human (NOVOLIN N) 100 UNIT/ML injection Inject 20 Units into the skin in the morning and at bedtime. 07/19/20  Yes [provider]  isosorbide mononitrate (IMDUR) 60 MG 24 hr tablet  Take 1 tablet (60 mg total) by mouth daily. 03/19/20 08/17/20 Yes Gollan, Kathlene November, MD  Multiple Vitamin (MULTIVITAMIN) capsule Take 1 capsule by mouth daily.   Yes [provider]  nitroGLYCERIN (NITROSTAT) 0.4 MG SL tablet Place 1 tablet (0.4 mg total) under the tongue every 5 (five) minutes as needed for chest pain. 12/24/15  Yes Theora Gianotti, NP  sodium bicarbonate 650 MG tablet Take 1,300 mg by mouth 2 (two) times daily.  03/01/19  Yes [provider]  sulfamethoxazole-trimethoprim (BACTRIM DS) 800-160 MG tablet Take 1 tablet by mouth in the morning and at bedtime. 08/16/20 08/23/20 Yes [provider]  torsemide (DEMADEX) 20 MG tablet Take 40 mg by mouth daily.   Yes [provider]  zolpidem (AMBIEN) 5 MG tablet Take 5 mg by mouth at bedtime as needed for sleep. 07/30/20 08/29/20 Yes [provider]    Physical Exam: Vitals:   08/17/20 0010 08/17/20 0406 08/17/20 0430 08/17/20 0535  BP: 131/69 (!) 149/79 (!) 173/91 (!) 155/76  Pulse: 83 82 78 80  Resp: 18 18  17   Temp: 98.4 F (36.9 C)     TempSrc: Oral      SpO2: 96% 99% 99% 99%  Weight:      Height:         Vitals:   08/17/20 0010 08/17/20 0406 08/17/20 0430 08/17/20 0535  BP: 131/69 (!) 149/79 (!) 173/91 (!) 155/76  Pulse: 83 82 78 80  Resp: 18 18  17   Temp: 98.4 F (36.9 C)     TempSrc: Oral     SpO2: 96% 99% 99% 99%  Weight:      Height:       General: - obese man in no distress Eyes: PERRL, lids and conjunctivae normal ENMT: Mucous membranes are moist. Neck: obese, normal, supple, no masses, no thyromegaly Respiratory: clear to auscultation bilaterally, no wheezing, no crackles. Normal respiratory effort. No accessory muscle use.  Cardiovascular: Regular rate and rhythm, no murmurs / rubs / gallops. No extremity edema. 2+ pedal pulses. No carotid bruits.  Abdomen: obese, no tenderness. Exam hindered by girth but no masses palpated. No hepatosplenomegaly. Bowel sounds positive.  Musculoskeletal: no clubbing / cyanosis. Missing toes left foot.  Good ROM, no contractures. Normal muscle tone.  Skin: no rashes, lesions. Shallow ulcer base 5th metatarsal right foot with clear drainage.  Induration noted right foot distal lateral aspect Neurologic: CN 2-12 grossly intact. Sensation very diminished LE. Strength 5/5 in all 4.  Psychiatric: Normal judgment and insight. Alert and oriented x 3. Normal mood.     Labs on Admission: I have personally reviewed following labs and imaging studies  CBC: Recent Labs  Lab 08/16/20 1945  WBC 10.8*  NEUTROABS 7.8*  HGB 11.9*  HCT 36.2*  MCV 96.3  PLT 664   Basic Metabolic Panel: Recent Labs  Lab 08/16/20 1945  NA 138  K 5.0  CL 102  CO2 23  GLUCOSE 194*  BUN 83*  CREATININE 3.63*  CALCIUM 8.7*   GFR: Estimated Creatinine Clearance: 30.8 mL/min (A) (by C-G formula based on SCr of 3.63 mg/dL (H)). Liver Function Tests: Recent Labs  Lab 08/16/20 1945  AST 21  ALT 17  ALKPHOS 64  BILITOT 0.8  PROT 7.9  ALBUMIN 3.8   No results for input(s): LIPASE, AMYLASE in the last  168 hours. No results for input(s): AMMONIA in the last 168 hours. Coagulation Profile: No results for input(s): INR, PROTIME in the  last 168 hours. Cardiac Enzymes: No results for input(s): CKTOTAL, CKMB, CKMBINDEX, TROPONINI in the last 168 hours. BNP (last 3 results) No results for input(s): PROBNP in the last 8760 hours. HbA1C: No results for input(s): HGBA1C in the last 72 hours. CBG: No results for input(s): GLUCAP in the last 168 hours. Lipid Profile: No results for input(s): CHOL, HDL, LDLCALC, TRIG, CHOLHDL, LDLDIRECT in the last 72 hours. Thyroid Function Tests: No results for input(s): TSH, T4TOTAL, FREET4, T3FREE, THYROIDAB in the last 72 hours. Anemia Panel: No results for input(s): VITAMINB12, FOLATE, FERRITIN, TIBC, IRON, RETICCTPCT in the last 72 hours. Urine analysis:    Component Value Date/Time   COLORURINE YELLOW (A) 08/14/2019 1724   APPEARANCEUR CLEAR (A) 08/14/2019 1724   APPEARANCEUR Clear 08/08/2013 1439   LABSPEC 1.011 08/14/2019 1724   LABSPEC 1.006 08/08/2013 1439   PHURINE 7.0 08/14/2019 1724   GLUCOSEU >=500 (A) 08/14/2019 1724   GLUCOSEU Negative 08/08/2013 1439   HGBUR NEGATIVE 08/14/2019 1724   BILIRUBINUR NEGATIVE 08/14/2019 1724   BILIRUBINUR Negative 08/08/2013 1439   KETONESUR NEGATIVE 08/14/2019 1724   PROTEINUR 100 (A) 08/14/2019 1724   UROBILINOGEN 1.0 05/18/2014 0659   NITRITE NEGATIVE 08/14/2019 1724   LEUKOCYTESUR NEGATIVE 08/14/2019 1724   LEUKOCYTESUR Negative 08/08/2013 1439    Radiological Exams on Admission: No results found.  EKG: Independently reviewed. No EKG done in ED  Assessment/Plan Active Problems:   Acute osteomyelitis of right foot (HCC)   Diabetes mellitus type 2 with complications (HCC)   Chronic systolic heart failure (HCC)   HTN (hypertension)   Hypercholesterolemia     1. Osteomyelitis right 5 th metatarsal - per podiatry. Plan IV abx with cefipime and Vanc  Podiatry to follow for probable ray  amputation  2. Chronic systolic heart failure - no evidence of decompensation. Last Echo Aug '20 w/ EF 30%, hypokinesis, LVH, psuedonormalization EF.  Plan Continue home meds  Care with hydration - IVF @ 50 cc/hr  Add BNP to lab for baseline   3. DM - last A1C 07/28/19  7.2% Plan A1C]   Basal insulin during acute inpatient stay  Sliding scale insulin  4. HLD - continue home meds  5. HTN- stable. Continue home meds  6. CKD III - patient with stable elevated creatinine and GFR @ 18. No evidence of fluid overload.    DVT prophylaxis: heparin  Code Status: full  Family Communication: spoke with Andrey Spearman, spouse, answered her concerns about close monitoring of his co-morbidities.  Disposition Plan: TBD  Consults called: Podiatry-[facility]  Admission status: Inpatient   Adella Hare MD [facility] Pager [phone]  If 7PM-7AM, please contact night-coverage [email or web address] Password [facility]  08/17/2020, 8:43 AM

## 2020-08-17 NOTE — ED Notes (Addendum)
Pt to ultrasound

## 2020-08-17 NOTE — ED Provider Notes (Signed)
[facility] Emergency Department Provider Note  ____________________________________________  Time seen: Approximately 3:34 AM  I have reviewed the triage vital signs and the nursing notes.   HISTORY  Chief Complaint Toe Injury   HPI [name] [name] is a 59 y.o. male with several chronic comorbidities as listed below including diabetes was sent in by his podiatrist for osteomyelitis.   According to the patient has been dealing with this wound for about a year.  Went to see his podiatrist today and had an x-ray consistent with acute osteomyelitis of the right metatarsal phalangeal joint.  Patient also has had worsening erythema and warmth over the last few days.  He was put on Bactrim by his podiatrist and sent over here for admission for IV antibiotics and surgical debridement today.  Patient denies fever or chills.  Does have neuropathy and therefore does not have any pain in his foot.  No nausea or vomiting.  Past Medical History:  Diagnosis Date  . CAD (coronary artery disease)    a. 04/2014 CABG x 4 (LIMA->LAD, VG->Diag, VG->OM, VG->PDA).  . Chronic systolic CHF (congestive heart failure) ([place])    a. 07/2014 Echo: EF 30-35%.  . CKD (chronic kidney disease), stage III    a. 12/2015 Creat 1.8.  . Diabetic foot ulcers ([place])    a. left foot 2nd digit ant 5 th digit 05/03/14; b. 08/2014 s/p amputation of toes on L foot.  . Hypercholesterolemia    a. 12/2015 TC 116, TG 154, HDL 24, LDL 61-->Atorvastatin 40.  Marland Kitchen Hypertension   . Hypertensive heart disease   . Ischemic cardiomyopathy    a. 07/2013 Echo: EF 20%; b. 07/2014 Echo: EF 30-35%, diff HK, Gr1 DD, mildly dil LA, nl PASP.  Marland Kitchen Myocardial infarction ([place])   . Neuropathy in diabetes ([place])   . Open wound    foot  . Orthostatic hypotension   . Peripheral vascular disease ([place])   . Type II diabetes mellitus ([place])    a. 12/2015 HbA1c = 13.9.    Patient Active Problem List   Diagnosis Date Noted  . CAD  (coronary artery disease), native coronary artery 08/18/2019  . Syncope and collapse   . Acute on chronic systolic CHF (congestive heart failure) ([place]) 08/14/2019  . Acute midline low back pain without sciatica   . Acute on chronic respiratory failure with hypoxemia ([place]) 07/28/2019  . Acute renal failure (ARF) ([place]) 07/12/2019  . Acute renal failure superimposed on stage 4 chronic kidney disease ([place]) 07/10/2019  . NSVT (nonsustained ventricular tachycardia) ([place]) 11/22/2018  . Chronic systolic heart failure ([place]) 10/12/2018  . HTN (hypertension) 10/12/2018  . Preop cardiovascular exam 12/10/2016  . Hypertensive heart disease   . Ischemic cardiomyopathy   . Hypercholesterolemia   . CKD (chronic kidney disease), stage III ([place])   . Acute on chronic combined systolic and diastolic CHF (congestive heart failure) ([place]) 09/19/2014  . Hyperlipidemia 07/31/2014  . Orthostatic hypotension 06/15/2014  . Chronic kidney disease 05/21/2014  . Chronic osteomyelitis of toe of left foot ([place]) 05/21/2014  . Diabetic ulcer of left foot ([place]) 05/21/2014  . Physical deconditioning 05/15/2014  . S/P CABG x 4 05/13/2014  . Diabetes mellitus type 2 with complications ([place]) 41/28/7867  . CAD (coronary artery disease)     Past Surgical History:  Procedure Laterality Date  . ACHILLES TENDON SURGERY Right 01/22/2017   Procedure: ACHILLES LENGTHENING/KIDNER/TAL/[place] achilles lengthening;  Surgeon: Samara Deist, DPM;  Location: [facility] ORS;  Service: Podiatry;  Laterality: Right;  . CARDIAC CATHETERIZATION     03/2014  . CATARACT EXTRACTION    . CORONARY ARTERY BYPASS GRAFT N/A 05/07/2014   Procedure: CORONARY ARTERY BYPASS GRAFTING (CABG);  Surgeon: Gaye Pollack, MD;  Location: [facility] [place];  Service: Open Heart Surgery;  Laterality: N/A;  Times 4 using left internal mammary artery and endoscopically harvested right saphenous vein  . DIALYSIS/PERMA CATHETER INSERTION N/A 08/04/2019   Procedure: DIALYSIS/PERMA  CATHETER INSERTION;  Surgeon: Katha Cabal, MD;  Location: [place] [facility];  Service: Cardiovascular;  Laterality: N/A;  . DIALYSIS/PERMA CATHETER REMOVAL N/A 08/08/2019   Procedure: DIALYSIS/PERMA CATHETER REMOVAL;  Surgeon: Katha Cabal, MD;  Location: [place] [facility];  Service: Cardiovascular;  Laterality: N/A;  . ICD IMPLANT N/A 10/04/2019   Procedure: ICD IMPLANT;  Surgeon: Deboraha Sprang, MD;  Location: [place] [facility];  Service: Cardiovascular;  Laterality: N/A;  . INTRAOPERATIVE TRANSESOPHAGEAL ECHOCARDIOGRAM N/A 05/07/2014   Procedure: INTRAOPERATIVE TRANSESOPHAGEAL ECHOCARDIOGRAM;  Surgeon: Gaye Pollack, MD;  Location: [place] [facility];  Service: Open Heart Surgery;  Laterality: N/A;  . left foot osteomyelitis and wound after surgery    . TOE AMPUTATION     Left 3 and 4 toes  . TONSILECTOMY/ADENOIDECTOMY WITH MYRINGOTOMY      Prior to Admission medications   Medication Sig Start Date End Date Taking? Authorizing Provider  acetaminophen (TYLENOL) 325 MG tablet Take by mouth every 6 (six) hours as needed for mild pain or moderate pain.    [provider]  allopurinol (ZYLOPRIM) 100 MG tablet Take 100 mg by mouth daily. 11/24/19   [provider]  aspirin EC 81 MG tablet Take 81 mg by mouth daily.    [provider]  atorvastatin (LIPITOR) 40 MG tablet TAKE 1 TABLET (40 MG TOTAL) BY MOUTH AT BEDTIME. 08/01/20   Minna Merritts, MD  carvedilol (COREG) 6.25 MG tablet TAKE 1 TABLET TWICE DAILY 02/20/20   Deboraha Sprang, MD  Cyanocobalamin (VITAMIN B 12 PO) Take 1,000 mcg by mouth at bedtime.     [provider]  ferrous sulfate 325 (65 FE) MG tablet Take 325 mg by mouth daily.    [provider]  glimepiride (AMARYL) 2 MG tablet Take 2 mg by mouth daily with breakfast.  03/07/20   [provider]  insulin aspart (NOVOLOG) 100 UNIT/ML injection Inject 0-9 Units into the skin 3 (three) times daily with meals. Patient taking  differently: Inject 0-9 Units into the skin 3 (three) times daily with meals. Sliding Scale 08/08/19   Fritzi Mandes, MD  isosorbide mononitrate (IMDUR) 60 MG 24 hr tablet Take 1 tablet (60 mg total) by mouth daily. 03/19/20 06/17/20  Minna Merritts, MD  LEVEMIR FLEXTOUCH 100 UNIT/ML FlexPen  01/03/20   [provider]  Multiple Vitamin (MULTIVITAMIN) capsule Take 1 capsule by mouth daily.    [provider]  nitroGLYCERIN (NITROSTAT) 0.4 MG SL tablet Place 1 tablet (0.4 mg total) under the tongue every 5 (five) minutes as needed for chest pain. 12/24/15   Theora Gianotti, NP  sodium bicarbonate 650 MG tablet Take 1,300 mg by mouth 2 (two) times daily.  03/01/19   [provider]  torsemide (DEMADEX) 20 MG tablet Take 40 mg by mouth daily.    [provider]    Allergies Unasyn [ampicillin-sulbactam sodium] and Zosyn [piperacillin sod-tazobactam so]  Family History  Problem Relation Age of Onset  . Heart disease Father  CABG in his 51's  . Diabetes type II Other   . Kidney disease Other   . Hypertension Other   . Ovarian cancer Mother     Social History Social History   Tobacco Use  . Smoking status: Never Smoker  . Smokeless tobacco: Never Used  Vaping Use  . Vaping Use: Never used  Substance Use Topics  . Alcohol use: No  . Drug use: No    Review of Systems  Constitutional: Negative for fever. Eyes: Negative for visual changes. ENT: Negative for sore throat. Neck: No neck pain  Cardiovascular: Negative for chest pain. Respiratory: Negative for shortness of breath. Gastrointestinal: Negative for abdominal pain, vomiting or diarrhea. Genitourinary: Negative for dysuria. Musculoskeletal: Negative for back pain. + R foot wound Skin: Negative for rash. Neurological: Negative for headaches, weakness or numbness. Psych: No SI or HI  ____________________________________________   PHYSICAL EXAM:  VITAL SIGNS: ED Triage  Vitals  Enc Vitals Group     BP 08/16/20 1943 (!) 181/78     Pulse Rate 08/16/20 1943 95     Resp 08/16/20 1943 18     Temp 08/16/20 1943 99.3 F (37.4 C)     Temp Source 08/16/20 1943 Oral     SpO2 08/16/20 1943 98 %     Weight 08/16/20 1939 270 lb (122.5 kg)     Height 08/16/20 1939 6\' 1"  (1.854 m)     Head Circumference --      Peak Flow --      Pain Score 08/16/20 1938 3     Pain Loc --      Pain Edu? --      Excl. in [place]? --     Constitutional: Alert and oriented. Well appearing and in no apparent distress. HEENT:      Head: Normocephalic and atraumatic.         Eyes: Conjunctivae are normal. Sclera is non-icteric.       Mouth/Throat: Mucous membranes are moist.       Neck: Supple with no signs of meningismus. Cardiovascular: Regular rate and rhythm. No murmurs, gallops, or rubs. 2+ symmetrical distal pulses are present in all extremities. No JVD. Respiratory: Normal respiratory effort. Lungs are clear to auscultation bilaterally. Gastrointestinal: Soft, non tender. Musculoskeletal: There is a small open wound to the sole of the R foot with surrounding warmth and erythema. Intact DP and PT pulses, no crepitus or bullae.  Neurologic: Normal speech and language. Face is symmetric. Moving all extremities. No gross focal neurologic deficits are appreciated. Skin: Skin is warm, dry and intact. No rash noted. Psychiatric: Mood and affect are normal. Speech and behavior are normal.  ____________________________________________   LABS (all labs ordered are listed, but only abnormal results are displayed)  Labs Reviewed  COMPREHENSIVE METABOLIC PANEL - Abnormal; Notable for the following components:      Result Value   Glucose, Bld 194 (*)    BUN 83 (*)    Creatinine, Ser 3.63 (*)    Calcium 8.7 (*)    GFR calc non Af Amer 18 (*)    GFR calc Af Amer 20 (*)    All other components within normal limits  CBC WITH DIFFERENTIAL/PLATELET - Abnormal; Notable for the following  components:   WBC 10.8 (*)    RBC 3.76 (*)    Hemoglobin 11.9 (*)    HCT 36.2 (*)    Neutro Abs 7.8 (*)    All other components within normal limits  CULTURE, BLOOD (ROUTINE X 2)  CULTURE, BLOOD (ROUTINE X 2)  RESPIRATORY PANEL BY RT PCR (FLU A&B, COVID)  LACTIC ACID, PLASMA   ____________________________________________  EKG  none  ____________________________________________  RADIOLOGY  none  ____________________________________________   PROCEDURES  Procedure(s) performed:yes .1-3 Lead EKG Interpretation Performed by: Rudene Re, MD Authorized by: Rudene Re, MD     Interpretation: non-specific     ECG rate assessment: normal     Rhythm: sinus rhythm     Ectopy: none     Critical Care performed:  None ____________________________________________   INITIAL IMPRESSION / ASSESSMENT AND PLAN / ED COURSE  59 y.o. male with several chronic comorbidities as listed below including diabetes was sent in by his podiatrist for acute osteomyelitis with superimposed cellulitis of the right fifth metatarsophalangeal joint.  Exam as listed below.  No signs of sepsis with no tachycardia, fever, or systemic symptoms.  Mild leukocytosis with white count of 10.8 and left shift.  Stable kidney function, mildly elevated blood glucose with no signs of DKA.  Lactic acid is normal.  I have visualized and reviewed the x-ray done by the podiatrist consistent with osteomyelitis, confirmed by radiology.  Will start patient on IV Rocephin and vancomycin and get him admitted to the hospitalist service.  Old medical records reviewed.  Patient placed on telemetry for close monitoring.      _____________________________________________ Please note:  Patient was evaluated in Emergency Department today for the symptoms described in the history of present illness. Patient was evaluated in the context of the global COVID-19 pandemic, which necessitated consideration that the patient  might be at risk for infection with the SARS-CoV-2 virus that causes COVID-19. Institutional protocols and algorithms that pertain to the evaluation of patients at risk for COVID-19 are in a state of rapid change based on information released by regulatory bodies including the CDC and federal and state organizations. These policies and algorithms were followed during the patient's care in the ED.  Some ED evaluations and interventions may be delayed as a result of limited staffing during the pandemic.    Controlled Substance Database was reviewed by me. ____________________________________________   FINAL CLINICAL IMPRESSION(S) / ED DIAGNOSES   Final diagnoses:  Osteomyelitis of right foot, unspecified type ([place])      NEW MEDICATIONS STARTED DURING THIS VISIT:  ED Discharge Orders    None       Note:  This document was prepared using Dragon voice recognition software and may include unintentional dictation errors.    Rudene Re, MD 08/17/20 [phone]

## 2020-08-17 NOTE — Consult Note (Addendum)
PODIATRY / FOOT AND ANKLE SURGERY CONSULTATION NOTE  Requesting Physician: Eugenie Norrie MD  Reason for consult: R 5th MTPJ ulceration, cellulitis, osteomyelitis  Chief Complaint: R foot infection   HPI: [name] [name] is a 59 y.o. male who presented yesterday to clinic in the late afternoon with a recurrent ulceration to the R 5th MTPJ.  He states that it has been open for a few weeks now and just within the past week or so it has become more painful, red, and swollen.  He and his wife had noticed some purulent drainage and odor coming from the wound yesterday so they made an appointment to be seen around 5pm on 08/16/20.  The wound probed to bone and patient had obvious cellulitis at that time.  X-rays were taken showing osteomyelitic changes to the L 5th MTPJ concerning for septic joint as well.  Pt denied n/v/f/c.  Pt was placed on oral Bactrim and a betadine wet to dry dressing was placed.  He was instructed to go to the ED at [facility] for admission since currently they are not taking direct admits.  Pt will need IV Abx while inpatient and partial R 5th ray amputation which will likely take place on Monday 08/19/20 in the PM.  Pt currently is in the ED and podiatry team has been consulted for further eval.  Pt is regularly seen by Dr. Vickki Muff and has hx of noncompliance.  His last documented A1c was around 10%.  He does note numbness to both feet.  He has a history of L TMA.  PMHx:  Past Medical History:  Diagnosis Date  . CAD (coronary artery disease)    a. 04/2014 CABG x 4 (LIMA->LAD, VG->Diag, VG->OM, VG->PDA).  . Chronic systolic CHF (congestive heart failure) ([place])    a. 07/2014 Echo: EF 30-35%.  . CKD (chronic kidney disease), stage III    a. 12/2015 Creat 1.8.  . Diabetic foot ulcers ([place])    a. left foot 2nd digit ant 5 th digit 05/03/14; b. 08/2014 s/p amputation of toes on L foot.  . Hypercholesterolemia    a. 12/2015 TC 116, TG 154, HDL 24, LDL 61-->Atorvastatin 40.  Marland Kitchen Hypertension   .  Hypertensive heart disease   . Ischemic cardiomyopathy    a. 07/2013 Echo: EF 20%; b. 07/2014 Echo: EF 30-35%, diff HK, Gr1 DD, mildly dil LA, nl PASP.  Marland Kitchen Myocardial infarction ([place])   . Neuropathy in diabetes ([place])   . Open wound    foot  . Orthostatic hypotension   . Peripheral vascular disease ([place])   . Type II diabetes mellitus ([place])    a. 12/2015 HbA1c = 13.9.    Surgical Hx:  Past Surgical History:  Procedure Laterality Date  . ACHILLES TENDON SURGERY Right 01/22/2017   Procedure: ACHILLES LENGTHENING/KIDNER/TAL/Teno achilles lengthening;  Surgeon: Samara Deist, DPM;  Location: [facility] OR[facility];  Service: Podiatry;  Laterality: Right;  . CARDIAC CATHETERIZATION     03/2014  . CATARACT EXTRACTION    . CORONARY ARTERY BYPASS GRAFT N/A 05/07/2014   Procedure: CORONARY ARTERY BYPASS GRAFTING (CABG);  Surgeon: Gaye Pollack, MD;  Location: [place];  Service: Open Heart Surgery;  Laterality: N/A;  Times 4 using left internal mammary artery and endoscopically harvested right saphenous vein  . DIALYSIS/PERMA CATHETER INSERTION N/A 08/04/2019   Procedure: DIALYSIS/PERMA CATHETER INSERTION;  Surgeon: Katha Cabal, MD;  Location: [place] [facility];  Service: Cardiovascular;  Laterality: N/A;  . DIALYSIS/PERMA CATHETER REMOVAL N/A 08/08/2019  Procedure: DIALYSIS/PERMA CATHETER REMOVAL;  Surgeon: Katha Cabal, MD;  Location: [place] [facility];  Service: Cardiovascular;  Laterality: N/A;  . ICD IMPLANT N/A 10/04/2019   Procedure: ICD IMPLANT;  Surgeon: Deboraha Sprang, MD;  Location: [place] [facility];  Service: Cardiovascular;  Laterality: N/A;  . INTRAOPERATIVE TRANSESOPHAGEAL ECHOCARDIOGRAM N/A 05/07/2014   Procedure: INTRAOPERATIVE TRANSESOPHAGEAL ECHOCARDIOGRAM;  Surgeon: Gaye Pollack, MD;  Location: [place] [facility];  Service: Open Heart Surgery;  Laterality: N/A;  . left foot osteomyelitis and wound after surgery    . TOE AMPUTATION     Left 3 and 4 toes  . TONSILECTOMY/ADENOIDECTOMY WITH  MYRINGOTOMY      FHx:  Family History  Problem Relation Age of Onset  . Heart disease Father        CABG in his 38's  . Diabetes type II Other   . Kidney disease Other   . Hypertension Other   . Ovarian cancer Mother     Social History:  reports that he has never smoked. He has never used smokeless tobacco. He reports that he does not drink alcohol and does not use drugs.  Allergies:  Allergies  Allergen Reactions  . 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 Doesn't believe he has an allergy to PCN as he has taken it previously, according to wife.  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 Doesn't believe he has an allergy to PCN as he has taken it previously, according to wife.    Review of Systems: General ROS: negative Respiratory ROS: no cough, shortness of breath, or wheezing Cardiovascular ROS: no chest pain or dyspnea on exertion Gastrointestinal ROS: no abdominal pain, change in bowel habits, or black or bloody stools Musculoskeletal ROS: positive for - joint pain, joint stiffness and joint swelling Neurological ROS: positive for - numbness/tingling Dermatological ROS: positive for R 5th MTPJ ulceration, redness, swelling, drainage  (Not in a hospital admission)   Physical Exam: General: Alert and oriented.  No apparent distress.  Vascular: DP/PT pulses palpable bil.  CFT intact to digits both feet.  Minimal to no hair growth to digits both feet.  Feet feel warm to touch bil.   Neuro: Light touch sensation absent to both feet.  Derm:  Open ulceration to the R plantar 5th MTPJ that probes to bone, expressible joint fluid noted through wound, mild purulent drainage, minimal to no fluctuance noted, associated erythema and edema to the area that extended to the midshaft lateral mets dorsally, mild odor.  MSK:  L TMA.  POP R  forefoot laterally  Results for orders placed or performed during the hospital encounter of 08/17/20 (from the past 48 hour(s))  Comprehensive metabolic panel     Status: Abnormal   Collection Time: 08/16/20  7:45 PM  Result Value Ref Range   Sodium 138 135 - 145 mmol/L   Potassium 5.0 3.5 - 5.1 mmol/L   Chloride 102 98 - 111 mmol/L   CO2 23 22 - 32 mmol/L   Glucose, Bld 194 (H) 70 - 99 mg/dL    Comment: Glucose reference range applies only to samples taken after fasting for at least 8 hours.   BUN 83 (H) 6 - 20 mg/dL   Creatinine, Ser 3.63 (H) 0.61 - 1.24 mg/dL   Calcium 8.7 (L) 8.9 - 10.3 mg/dL   Total Protein 7.9 6.5 - 8.1 g/dL   Albumin 3.8  3.5 - 5.0 g/dL   AST 21 15 - 41 U/L   ALT 17 0 - 44 U/L   Alkaline Phosphatase 64 38 - 126 U/L   Total Bilirubin 0.8 0.3 - 1.2 mg/dL   GFR calc non Af Amer 18 (L) >60 mL/min   GFR calc Af Amer 20 (L) >60 mL/min   Anion gap 13 5 - 15    Comment: Performed at [facility], [address] [place], [place] [address]  CBC with Differential     Status: Abnormal   Collection Time: 08/16/20  7:45 PM  Result Value Ref Range   WBC 10.8 (H) 4.0 - 10.5 K/uL   RBC 3.76 (L) 4.22 - 5.81 MIL/uL   Hemoglobin 11.9 (L) 13.0 - 17.0 g/dL   HCT 36.2 (L) 39 - 52 %   MCV 96.3 80.0 - 100.0 fL   MCH 31.6 26.0 - 34.0 pg   MCHC 32.9 30.0 - 36.0 g/dL   RDW 13.3 11.5 - 15.5 %   Platelets 194 150 - 400 K/uL   nRBC 0.0 0.0 - 0.2 %   Neutrophils Relative % 72 %   Neutro Abs 7.8 (H) 1.7 - 7.7 K/uL   Lymphocytes Relative 16 %   Lymphs Abs 1.8 0.7 - 4.0 K/uL   Monocytes Relative 9 %   Monocytes Absolute 0.9 0 - 1 K/uL   Eosinophils Relative 2 %   Eosinophils Absolute 0.2 0 - 0 K/uL   Basophils Relative 1 %   Basophils Absolute 0.1 0 - 0 K/uL   Immature Granulocytes 0 %   Abs Immature Granulocytes 0.04 0.00 - 0.07 K/uL    Comment: Performed at [facility], [address] [place], [place] [address]  Culture, blood (routine x 2)     Status:  None (Preliminary result)   Collection Time: 08/17/20 12:41 AM   Specimen: Right Antecubital; Blood  Result Value Ref Range   Specimen Description RIGHT ANTECUBITAL    Special Requests      BOTTLES DRAWN AEROBIC AND ANAEROBIC Blood Culture adequate volume   Culture      NO GROWTH < 12 HOURS Performed at [facility], [address], [place], [place] [address]    Report Status PENDING   Culture, blood (routine x 2)     Status: None (Preliminary result)   Collection Time: 08/17/20 12:41 AM   Specimen: BLOOD LEFT HAND  Result Value Ref Range   Specimen Description BLOOD LEFT HAND    Special Requests      BOTTLES DRAWN AEROBIC AND ANAEROBIC Blood Culture results may not be optimal due to an excessive volume of blood received in culture bottles   Culture      NO GROWTH < 12 HOURS Performed at [facility], [address] [place], [place] [address]    Report Status PENDING   Lactic acid, plasma     Status: None   Collection Time: 08/17/20 12:41 AM  Result Value Ref Range   Lactic Acid, Venous 1.7 0.5 - 1.9 mmol/L    Comment: Performed at [facility], [address], [place], [place] [address]  Respiratory Panel by RT PCR (Flu A&B, Covid) - Nasopharyngeal Swab     Status: None   Collection Time: 08/17/20  3:39 AM   Specimen: Nasopharyngeal Swab  Result Value Ref Range   SARS Coronavirus 2 by RT PCR NEGATIVE NEGATIVE    Comment: (NOTE) SARS-CoV-2 target nucleic acids are NOT DETECTED.  The SARS-CoV-2 RNA is generally detectable in upper respiratoy specimens during the acute phase of infection. The lowest concentration of SARS-CoV-2 viral copies this assay can detect is 131 copies/mL. A negative result does not preclude SARS-Cov-2 infection and should not be used as the sole basis for treatment or other patient management decisions. A negative result may occur with  improper specimen collection/handling, submission of specimen other than  nasopharyngeal swab, presence of viral mutation(s) within the areas targeted by this assay, and inadequate number of viral copies (<131 copies/mL). A negative result must be combined with clinical observations, patient history, and epidemiological information. The expected result is Negative.  Fact Sheet for Patients:  PinkCheek.be  Fact Sheet for Healthcare Providers:  GravelBags.it  This test is no t yet approved or cleared by the [place] FDA and  has been authorized for detection and/or diagnosis of SARS-CoV-2 by FDA under an Emergency Use Authorization (EUA). This EUA will remain  in effect (meaning this test can be used) for the duration of the COVID-19 declaration under Section 564(b)(1) of the Act, 21 U.S.C. section 360bbb-3(b)(1), unless the authorization is terminated or revoked sooner.     Influenza A by PCR NEGATIVE NEGATIVE   Influenza B by PCR NEGATIVE NEGATIVE    Comment: (NOTE) The Xpert Xpress SARS-CoV-2/FLU/RSV assay is intended as an aid in  the diagnosis of influenza from Nasopharyngeal swab specimens and  should not be used as a sole basis for treatment. Nasal washings and  aspirates are unacceptable for Xpert Xpress SARS-CoV-2/FLU/RSV  testing.  Fact Sheet for Patients: PinkCheek.be  Fact Sheet for Healthcare Providers: GravelBags.it  This test is not yet approved or cleared by the [place] FDA and  has been authorized for detection and/or diagnosis of SARS-CoV-2 by  FDA under an Emergency Use Authorization (EUA). This EUA will remain  in effect (meaning this test can be used) for the duration of the  Covid-19 declaration under Section 564(b)(1) of the Act, 21  U.S.C. section 360bbb-3(b)(1), unless the authorization is  terminated or revoked. Performed at [facility], [address] [place], [place] [address]    No  results found.  Blood pressure (!) 155/76, pulse 80, temperature 98.4 F (36.9 C), temperature source Oral, resp. rate 17, height 6\' 1"  (1.854 m), weight 122.5 kg, SpO2 99 %.  Assessment 1. 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 DM2, uncontrolled with polyneuropathy 3. PVD 4. Hx L TMA  Plan -Pt seen and examined yesterday 08/16/20 late afternoon/evening in clinic. -Wound probed to bone at the R 5th MTPJ and x-rays revealed evidence of OM of the 5th MTPJ with associated ulceration, no gas in tissues. -Pt will be admitted for IV Abx and subsequent partial R 5th ray amputation.  Will plan on this tentatively for Monday 08/19/20 afternoon.  Likely NPO after breakfast that morning. -Will re-eval patient tomorrow and discuss surgical intervention in detail again as was discussed in clinic. -Appreciate recs for IV Abx per medicine.  Culture was taken in clinic yesterday.  Will plan to take another intraop culture and path specimen.  WBC around 10, pt AFVSS, no signs of sepsis based on labs or previous clinical appearance. -Ordered arterial dopplers.  If abnormal will consult vascular.  Pt has palpable pulses, CFT intact, no signs of claudication, feet are warm to touch.  Need baseline studies. -Pt cannot undergo MRI due to pacemaker placement.  Caroline More, DPM

## 2020-08-17 NOTE — ED Notes (Signed)
Hand hygiene performed - Pt given breakfast tray 

## 2020-08-17 NOTE — Progress Notes (Signed)
Pharmacy Antibiotic Note  [name] [name] is a 59 y.o. male admitted on 08/17/2020 with osteomyelitis.  Pharmacy has been consulted for Vancomycin and Cefepime dosing.  Plan: Cefepime 2gm IV q12hrs Vancomycin variable dosing due to unstable renal fxn, elevated SCr  Height: 6\' 1"  (185.4 cm) Weight: 122.5 kg (270 lb) IBW/kg (Calculated) : 79.9  Temp (24hrs), Avg:98.8 F (37.1 C), Min:98.4 F (36.9 C), Max:99.3 F (37.4 C)  Recent Labs  Lab 08/16/20 1945 08/17/20 0041  WBC 10.8*  --   CREATININE 3.63*  --   LATICACIDVEN  --  1.7    Estimated Creatinine Clearance: 30.8 mL/min (A) (by C-G formula based on SCr of 3.63 mg/dL (H)).    Allergies  Allergen Reactions  . 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 Doesn't believe he has an allergy to PCN as he has taken it previously, according to wife.  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 Doesn't believe he has an allergy to PCN as he has taken it previously, according to wife.    Antimicrobials this admission:   >>    >>   Dose adjustments this admission:   Microbiology results:  BCx:   UCx:    Sputum:    MRSA PCR:   Thank you for allowing pharmacy to be a part of this patient's care.  Hart Robinsons A 08/17/2020 6:44 AM

## 2020-08-17 NOTE — ED Notes (Signed)
Pt assisted to toilet 

## 2020-08-17 NOTE — ED Notes (Signed)
Pt received meal tray. 

## 2020-08-17 NOTE — ED Notes (Signed)
Pt did not receive meal tray- called dietary, they state they will bring new meal tray.

## 2020-08-17 NOTE — ED Notes (Signed)
Pt up independently to restroom

## 2020-08-18 ENCOUNTER — Encounter: Payer: Self-pay | Admitting: Family Medicine

## 2020-08-18 DIAGNOSIS — E7800 Pure hypercholesterolemia, unspecified: Secondary | ICD-10-CM

## 2020-08-18 LAB — BASIC METABOLIC PANEL
Anion gap: 8 (ref 5–15)
BUN: 71 mg/dL — ABNORMAL HIGH (ref 6–20)
CO2: 21 mmol/L — ABNORMAL LOW (ref 22–32)
Calcium: 7.8 mg/dL — ABNORMAL LOW (ref 8.9–10.3)
Chloride: 111 mmol/L (ref 98–111)
Creatinine, Ser: 3.55 mg/dL — ABNORMAL HIGH (ref 0.61–1.24)
GFR calc Af Amer: 21 mL/min — ABNORMAL LOW (ref >60)
GFR calc non Af Amer: 18 mL/min — ABNORMAL LOW (ref >60)
Glucose, Bld: 88 mg/dL (ref 70–99)
Potassium: 4.2 mmol/L (ref 3.5–5.1)
Sodium: 140 mmol/L (ref 135–145)

## 2020-08-18 LAB — CBC
HCT: 31.8 % — ABNORMAL LOW (ref 39.0–52.0)
Hemoglobin: 10.5 g/dL — ABNORMAL LOW (ref 13.0–17.0)
MCH: 31.4 pg (ref 26.0–34.0)
MCHC: 33 g/dL (ref 30.0–36.0)
MCV: 95.2 fL (ref 80.0–100.0)
Platelets: 150 10*3/uL (ref 150–400)
RBC: 3.34 MIL/uL — ABNORMAL LOW (ref 4.22–5.81)
RDW: 13.2 % (ref 11.5–15.5)
WBC: 6.8 10*3/uL (ref 4.0–10.5)
nRBC: 0 % (ref 0.0–0.2)

## 2020-08-18 LAB — GLUCOSE, CAPILLARY
Glucose-Capillary: 88 mg/dL (ref 70–99)
Glucose-Capillary: 159 mg/dL — ABNORMAL HIGH (ref 70–99)
Glucose-Capillary: 143 mg/dL — ABNORMAL HIGH (ref 70–99)
Glucose-Capillary: 193 mg/dL — ABNORMAL HIGH (ref 70–99)

## 2020-08-18 MED ORDER — SODIUM CHLORIDE 0.9 % IV SOLN
INTRAVENOUS | Status: DC | PRN
  Administered 2020-08-18: 250 mL via INTRAVENOUS

## 2020-08-18 MED ORDER — SODIUM BICARBONATE 650 MG PO TABS
1300.0000 mg | ORAL_TABLET | Freq: Two times a day (BID) | ORAL | Status: DC
  Administered 2020-08-18 – 2020-08-21 (×6): 1300 mg via ORAL
  Filled 2020-08-18 (×7): qty 2

## 2020-08-18 MED ORDER — VANCOMYCIN HCL 1500 MG/300ML IV SOLN
1500.0000 mg | INTRAVENOUS | Status: DC
  Administered 2020-08-18: 1500 mg via INTRAVENOUS
  Filled 2020-08-18 (×2): qty 300

## 2020-08-18 MED ORDER — DEXTROSE-NACL 5-0.45 % IV SOLN: INTRAVENOUS | Status: DC

## 2020-08-18 NOTE — Progress Notes (Signed)
PROGRESS NOTE    BYAN POPLASKI  HEN:[id number] DOB: 30-Apr-1961 DOA: 08/17/2020 PCP: Tracie Harrier, MD    Brief Narrative:  AMAURIS [name] is a 59 y.o. male with medical history significant of CAD s/p CABG, chronic systolic HF, CKD III, HTN, ischemic cardiomyopathy w/ EF 30-35% Sept '15 sent in by his podiatrist for osteomyelitis.According to the patient has been dealing with this wound for about a year. Went to see his podiatrist today and had an x-ray consistent with acute osteomyelitis of the right metatarsal phalangeal joint. Patient also has had worsening erythema and warmth over the last few days. He was put on Bactrim by his podiatrist and sent over here for admission for IV antibiotics and surgical debridement    Consultants:   Podiatry  Procedures:  Arterial ultrasound No evidence of hemodynamically significant lower extremity arterial occlusive disease at rest. ABIs may be artificially elevated in diabetics secondary to incompressible vessel calcifications (medial arterial sclerosis of Monckeberg).    Antimicrobials:   Cefepime and vancomycin   Subjective: Patient has no complaints.  Wife at bedside.  Objective: Vitals:   08/17/20 1214 08/17/20 1753 08/17/20 1900 08/18/20 0519  BP:  140/82  (!) 157/59  Pulse:  83 86 85  Resp: 16 18 17 16   Temp:    97.9 F (36.6 C)  TempSrc:    Oral  SpO2:  98% 99% 99%  Weight:      Height:       No intake or output data in the 24 hours ending 08/18/20 0903 Filed Weights   08/16/20 1939  Weight: 122.5 kg    Examination:  General exam: Appears calm and comfortable  Respiratory system: Clear to auscultation. Respiratory effort normal. Cardiovascular system: S1 & S2 heard, RRR. No JVD, murmurs, rubs, gallops or clicks.  Gastrointestinal system: Abdomen is nondistended, soft and nontender. Normal bowel sounds heard. Central nervous system: Alert and oriented. No focal neurological deficits. Extremities: RT  foot wrapping, did not open. No edema. Skin: warm, dry Psychiatry: Judgement and insight appear normal. Mood & affect appropriate.     Data Reviewed: I have personally reviewed following labs and imaging studies  CBC: Recent Labs  Lab 08/16/20 1945 08/18/20 0521  WBC 10.8* 6.8  NEUTROABS 7.8*  --   HGB 11.9* 10.5*  HCT 36.2* 31.8*  MCV 96.3 95.2  PLT 194 361   Basic Metabolic Panel: Recent Labs  Lab 08/16/20 1945 08/18/20 0521  NA 138 140  K 5.0 4.2  CL 102 111  CO2 23 21*  GLUCOSE 194* 88  BUN 83* 71*  CREATININE 3.63* 3.55*  CALCIUM 8.7* 7.8*   GFR: Estimated Creatinine Clearance: 31.5 mL/min (A) (by C-G formula based on SCr of 3.55 mg/dL (H)). Liver Function Tests: Recent Labs  Lab 08/16/20 1945  AST 21  ALT 17  ALKPHOS 64  BILITOT 0.8  PROT 7.9  ALBUMIN 3.8   No results for input(s): LIPASE, AMYLASE in the last 168 hours. No results for input(s): AMMONIA in the last 168 hours. Coagulation Profile: No results for input(s): INR, PROTIME in the last 168 hours. Cardiac Enzymes: No results for input(s): CKTOTAL, CKMB, CKMBINDEX, TROPONINI in the last 168 hours. BNP (last 3 results) No results for input(s): PROBNP in the last 8760 hours. HbA1C: Recent Labs    08/16/20 1945  HGBA1C 8.7*   CBG: Recent Labs  Lab 08/17/20 0908 08/17/20 1222 08/17/20 1715 08/17/20 2327 08/18/20 0755  GLUCAP 145* 221* 150* 152* 88  Lipid Profile: No results for input(s): CHOL, HDL, LDLCALC, TRIG, CHOLHDL, LDLDIRECT in the last 72 hours. Thyroid Function Tests: No results for input(s): TSH, T4TOTAL, FREET4, T3FREE, THYROIDAB in the last 72 hours. Anemia Panel: No results for input(s): VITAMINB12, FOLATE, FERRITIN, TIBC, IRON, RETICCTPCT in the last 72 hours. Sepsis Labs: Recent Labs  Lab 08/17/20 0041  LATICACIDVEN 1.7    Recent Results (from the past 240 hour(s))  Culture, blood (routine x 2)     Status: None (Preliminary result)   Collection Time:  08/17/20 12:41 AM   Specimen: Right Antecubital; Blood  Result Value Ref Range Status   Specimen Description RIGHT ANTECUBITAL  Final   Special Requests   Final    BOTTLES DRAWN AEROBIC AND ANAEROBIC Blood Culture adequate volume   Culture   Final    NO GROWTH 1 DAY Performed at [facility], [address] [place], [place] [address]    Report Status PENDING  Incomplete  Culture, blood (routine x 2)     Status: None (Preliminary result)   Collection Time: 08/17/20 12:41 AM   Specimen: BLOOD LEFT HAND  Result Value Ref Range Status   Specimen Description BLOOD LEFT HAND  Final   Special Requests   Final    BOTTLES DRAWN AEROBIC AND ANAEROBIC Blood Culture results may not be optimal due to an excessive volume of blood received in culture bottles   Culture   Final    NO GROWTH 1 DAY Performed at [facility], [address] [place], [place] [address]    Report Status PENDING  Incomplete  Respiratory Panel by RT PCR (Flu A&B, Covid) - Nasopharyngeal Swab     Status: None   Collection Time: 08/17/20  3:39 AM   Specimen: Nasopharyngeal Swab  Result Value Ref Range Status   SARS Coronavirus 2 by RT PCR NEGATIVE NEGATIVE Final    Comment: (NOTE) SARS-CoV-2 target nucleic acids are NOT DETECTED.  The SARS-CoV-2 RNA is generally detectable in upper respiratoy specimens during the acute phase of infection. The lowest concentration of SARS-CoV-2 viral copies this assay can detect is 131 copies/mL. A negative result does not preclude SARS-Cov-2 infection and should not be used as the sole basis for treatment or other patient management decisions. A negative result may occur with  improper specimen collection/handling, submission of specimen other than nasopharyngeal swab, presence of viral mutation(s) within the areas targeted by this assay, and inadequate number of viral copies (<131 copies/mL). A negative result must be combined with clinical observations, patient  history, and epidemiological information. The expected result is Negative.  Fact Sheet for Patients:  PinkCheek.be  Fact Sheet for Healthcare Providers:  GravelBags.it  This test is no t yet approved or cleared by the [place] FDA and  has been authorized for detection and/or diagnosis of SARS-CoV-2 by FDA under an Emergency Use Authorization (EUA). This EUA will remain  in effect (meaning this test can be used) for the duration of the COVID-19 declaration under Section 564(b)(1) of the Act, 21 U.S.C. section 360bbb-3(b)(1), unless the authorization is terminated or revoked sooner.     Influenza A by PCR NEGATIVE NEGATIVE Final   Influenza B by PCR NEGATIVE NEGATIVE Final    Comment: (NOTE) The Xpert Xpress SARS-CoV-2/FLU/RSV assay is intended as an aid in  the diagnosis of influenza from Nasopharyngeal swab specimens and  should not be used as a sole basis for treatment. Nasal washings and  aspirates are unacceptable for Xpert Xpress SARS-CoV-2/FLU/RSV  testing.  Fact Sheet for Patients: PinkCheek.be  Fact Sheet for Healthcare Providers: GravelBags.it  This test is not yet approved or cleared by the [place] FDA and  has been authorized for detection and/or diagnosis of SARS-CoV-2 by  FDA under an Emergency Use Authorization (EUA). This EUA will remain  in effect (meaning this test can be used) for the duration of the  Covid-19 declaration under Section 564(b)(1) of the Act, 21  U.S.C. section 360bbb-3(b)(1), unless the authorization is  terminated or revoked. Performed at [facility], [address] [place],  [address]          Radiology Studies: US ARTERIAL ABI (SCREENING LOWER EXTREMITY)  Result Date: 08/17/2020 CLINICAL DATA:  Previous left toe amputations. Diabetes, hypertension, obesity EXAM: NONINVASIVE PHYSIOLOGIC  VASCULAR STUDY OF BILATERAL LOWER EXTREMITIES TECHNIQUE: Evaluation of both lower extremities were performed at rest, including calculation of ankle-brachial indices with single level Doppler, pressure and pulse volume recording. COMPARISON:  None available FINDINGS: Right ABI:  1.16 Left ABI:  1.10 Right Lower Extremity:  Multiphasic waveforms at the ankle. Left Lower Extremity: Multiphasic distal posterior tibial arterial waveforms. IMPRESSION: No evidence of hemodynamically significant lower extremity arterial occlusive disease at rest. ABIs may be artificially elevated in diabetics secondary to incompressible vessel calcifications (medial arterial sclerosis of Monckeberg). Electronically Signed   By: Lucrezia Europe M.D.   On: 08/17/2020 11:07        Scheduled Meds: . allopurinol  100 mg Oral Daily  . amLODipine  2.5 mg Oral Daily  . atorvastatin  40 mg Oral QHS  . carvedilol  3.125 mg Oral BID WC  . ferrous sulfate  325 mg Oral Daily  . heparin  5,000 Units Subcutaneous Q8H  . insulin aspart  0-20 Units Subcutaneous TID WC  . insulin detemir  10 Units Subcutaneous BID  . isosorbide mononitrate  60 mg Oral Daily  . multivitamin with minerals  1 tablet Oral Daily  . sodium bicarbonate  1,300 mg Oral BID  . torsemide  40 mg Oral Daily  . vitamin B-12  1,000 mcg Oral QHS   Continuous Infusions: . sodium chloride 50 mL/hr at 08/18/20 0513  . ceFEPime (MAXIPIME) IV Stopped (08/18/20 3903)  . [START ON 08/19/2020] vancomycin      Assessment & Plan:   Active Problems:   Diabetes mellitus type 2 with complications (HCC)   Hypercholesterolemia   Chronic systolic heart failure (HCC)   HTN (hypertension)   Acute osteomyelitis of right foot ([place])   1. Osteomyelitis right 5 th metatarsal -present on admission Per podiatry wound probe to bone, x-ray from clinic revealed evidence of OM at fifth MTP J with associated ulceration, no gas in tissue Wound culture taken today Dressing change daily  until surgery Plan for right partial fifth ray amputation in a.m. Continue vancomycin and cefepime Arterial Doppler within normal limits, no vascular consultation needed at this time Partial weightbearing in surgical shoe with heel contact.  Order placed by podiatry Hold anticoagulation 24 hours prior to procedure and restart 24 hours after procedure per podiatry  2. Chronic systolic heart failure - no evidence of decompensation.  Last Echo 9/20  w/ EF ejection fraction of 30-35%  Continue beta blk, torsemide, Imdur   3. DM -  Check A1c RISS Hypoglycemic protocal Hold Lantus as he will be NPO at [place]                4. HLD -continue on statin  5. HTN-  stable, continue with beta blk, amlodipine, Imdur  6. CKD IV - currently close to baseline.  Avoid nephrotoxic agent Monitor closely   DVT prophylaxis: scd Code Status: Full Family Communication: Wife at bedside updated  Status is: Inpatient  Remains inpatient appropriate because:IV treatments appropriate due to intensity of illness or inability to take PO   Dispo: The patient is from: Home              Anticipated d/c is to: Home              Anticipated d/c date is: 3 days              Patient currently is not medically stable to d/c.            LOS: 1 day   Time spent: 45 min with >50% on coc   Nolberto Hanlon, MD [facility] Pager [phone]  If 7PM-7AM, please contact night-coverage [email or web address] Password [email or web address] 08/18/2020, 9:03 AM

## 2020-08-18 NOTE — Progress Notes (Signed)
Pharmacy Antibiotic Note  [name] [name] is a 59 y.o. male admitted on 08/17/2020 with osteomyelitis.  Pharmacy has been consulted for Vancomycin and Cefepime dosing.  Patient has CKD3 and stable renal function at their baseline per MD assessment. Vanc dosing calculated using normalized CrCl for patient since TBW>100kg.  Plan: Continue Cefepime 2gm IV q12hrs  Will adjust Vancomycin 1750 mg IV q48h to vancomycin 1500mg  q24hr per improved renal function and consult update to osteomyelitis  (Goal trough 15-20)  Monitor clinical picture, renal function, vanc levels if needed F/U C&S, abx de-escalation, LOT  Height: 6\' 1"  (185.4 cm) Weight: 122.5 kg (270 lb) IBW/kg (Calculated) : 79.9  Temp (24hrs), Avg:97.8 F (36.6 C), Min:97.6 F (36.4 C), Max:97.9 F (36.6 C)  Recent Labs  Lab 08/16/20 1945 08/17/20 0041 08/18/20 0521  WBC 10.8*  --  6.8  CREATININE 3.63*  --  3.55*  LATICACIDVEN  --  1.7  --     Estimated Creatinine Clearance: 31.5 mL/min (A) (by C-G formula based on SCr of 3.55 mg/dL (H)).    Allergies  Allergen Reactions  . 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 Doesn't believe he has an allergy to PCN as he has taken it previously, according to wife.  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 Doesn't believe he has an allergy to PCN as he has taken it previously, according to wife.    Antimicrobials this admission: CTX 10/2 x1 Vanc 10/2 >>  Cefepime 10/2  >>   Microbiology results: 10/2 BCx: ng<12h    Thank you for allowing pharmacy to be a part of this patient's care.  Lu Duffel, PharmD, BCPS Clinical Pharmacist 08/18/2020 5:14 PM

## 2020-08-18 NOTE — Progress Notes (Addendum)
PODIATRY / FOOT AND ANKLE SURGERY PROGRESS NOTE  Requesting Physician: Eugenie Norrie MD  Reason for consult: R 5th MTPJ ulceration, cellulitis, osteomyelitis  Chief Complaint: R foot infection   HPI: [name] [name] is a 59 y.o. male who presents resting in bed comfortably in the emergency room.  Patient notes that the redness and swelling as well as pain to the right foot has gone down since his admission and since receiving IV antibiotics.  Patient presents today without wearing a dressing on his right foot.  Patient does have a surgical shoe on.  PMHx:  Past Medical History:  Diagnosis Date  . CAD (coronary artery disease)    a. 04/2014 CABG x 4 (LIMA->LAD, VG->Diag, VG->OM, VG->PDA).  . Chronic systolic CHF (congestive heart failure) ([place])    a. 07/2014 Echo: EF 30-35%.  . CKD (chronic kidney disease), stage III    a. 12/2015 Creat 1.8.  . Diabetic foot ulcers ([place])    a. left foot 2nd digit ant 5 th digit 05/03/14; b. 08/2014 s/p amputation of toes on L foot.  . Hypercholesterolemia    a. 12/2015 TC 116, TG 154, HDL 24, LDL 61-->Atorvastatin 40.  Marland Kitchen Hypertension   . Hypertensive heart disease   . Ischemic cardiomyopathy    a. 07/2013 Echo: EF 20%; b. 07/2014 Echo: EF 30-35%, diff HK, Gr1 DD, mildly dil LA, nl PASP.  Marland Kitchen Myocardial infarction ([place])   . Neuropathy in diabetes ([place])   . Open wound    foot  . Orthostatic hypotension   . Peripheral vascular disease ([place])   . Type II diabetes mellitus ([place])    a. 12/2015 HbA1c = 13.9.    Surgical Hx:  Past Surgical History:  Procedure Laterality Date  . ACHILLES TENDON SURGERY Right 01/22/2017   Procedure: ACHILLES LENGTHENING/KIDNER/TAL/Teno achilles lengthening;  Surgeon: Samara Deist, DPM;  Location: [facility];  Service: Podiatry;  Laterality: Right;  . CARDIAC CATHETERIZATION     03/2014  . CATARACT EXTRACTION    . CORONARY ARTERY BYPASS GRAFT N/A 05/07/2014   Procedure: CORONARY ARTERY BYPASS GRAFTING (CABG);  Surgeon: Gaye Pollack,  MD;  Location: [place];  Service: Open Heart Surgery;  Laterality: N/A;  Times 4 using left internal mammary artery and endoscopically harvested right saphenous vein  . DIALYSIS/PERMA CATHETER INSERTION N/A 08/04/2019   Procedure: DIALYSIS/PERMA CATHETER INSERTION;  Surgeon: Katha Cabal, MD;  Location: [place] [facility];  Service: Cardiovascular;  Laterality: N/A;  . DIALYSIS/PERMA CATHETER REMOVAL N/A 08/08/2019   Procedure: DIALYSIS/PERMA CATHETER REMOVAL;  Surgeon: Katha Cabal, MD;  Location: [place] [facility];  Service: Cardiovascular;  Laterality: N/A;  . ICD IMPLANT N/A 10/04/2019   Procedure: ICD IMPLANT;  Surgeon: Deboraha Sprang, MD;  Location: [place] [facility];  Service: Cardiovascular;  Laterality: N/A;  . INTRAOPERATIVE TRANSESOPHAGEAL ECHOCARDIOGRAM N/A 05/07/2014   Procedure: INTRAOPERATIVE TRANSESOPHAGEAL ECHOCARDIOGRAM;  Surgeon: Gaye Pollack, MD;  Location: [place] [facility];  Service: Open Heart Surgery;  Laterality: N/A;  . left foot osteomyelitis and wound after surgery    . TOE AMPUTATION     Left 3 and 4 toes  . TONSILECTOMY/ADENOIDECTOMY WITH MYRINGOTOMY      FHx:  Family History  Problem Relation Age of Onset  . Heart disease Father        CABG in his 35's  . Diabetes type II Other   . Kidney disease Other   . Hypertension Other   . Ovarian cancer Mother  Social History:  reports that he has never smoked. He has never used smokeless tobacco. He reports that he does not drink alcohol and does not use drugs.  Allergies:  Allergies  Allergen Reactions  . 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 Doesn't believe he has an allergy to PCN as he has taken it previously, according to wife.  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 Doesn't believe he has an allergy to PCN as he has taken it previously, according to wife.     Review of Systems: General ROS: negative Respiratory ROS: no cough, shortness of breath, or wheezing Cardiovascular ROS: no chest pain or dyspnea on exertion Gastrointestinal ROS: no abdominal pain, change in bowel habits, or black or bloody stools Musculoskeletal ROS: positive for - joint pain, joint stiffness and joint swelling Neurological ROS: positive for - numbness/tingling Dermatological ROS: positive for R 5th MTPJ ulceration, redness, swelling, drainage  (Not in a hospital admission)   Physical Exam: General: Alert and oriented.  No apparent distress.  Vascular: DP/PT pulses palpable bil.  CFT intact to digits both feet.  Minimal to no hair growth to digits both feet.  Feet feel warm to touch bil.   Neuro: Light touch sensation absent to both feet.  Derm:  Open ulceration to the R plantar 5th MTPJ that probes to bone, expressible joint fluid noted through wound, mild purulent drainage, minimal to no fluctuance noted, associated erythema and edema to the area that extended to the midshaft lateral mets dorsally appears to be improved greatly today compared to clinical visit, mild odor.  MSK:  L TMA.  POP R forefoot laterally  Results for orders placed or performed during the hospital encounter of 08/17/20 (from the past 48 hour(s))  Comprehensive metabolic panel     Status: Abnormal   Collection Time: 08/16/20  7:45 PM  Result Value Ref Range   Sodium 138 135 - 145 mmol/L   Potassium 5.0 3.5 - 5.1 mmol/L   Chloride 102 98 - 111 mmol/L   CO2 23 22 - 32 mmol/L   Glucose, Bld 194 (H) 70 - 99 mg/dL    Comment: Glucose reference range applies only to samples taken after fasting for at least 8 hours.   BUN 83 (H) 6 - 20 mg/dL   Creatinine, Ser 3.63 (H) 0.61 - 1.24 mg/dL   Calcium 8.7 (L) 8.9 - 10.3 mg/dL   Total Protein 7.9 6.5 - 8.1 g/dL   Albumin 3.8 3.5 - 5.0 g/dL   AST 21 15 - 41 U/L   ALT 17 0 - 44 U/L   Alkaline Phosphatase 64 38 - 126 U/L   Total Bilirubin 0.8  0.3 - 1.2 mg/dL   GFR calc non Af Amer 18 (L) >60 mL/min   GFR calc Af Amer 20 (L) >60 mL/min   Anion gap 13 5 - 15    Comment: Performed at [facility], [address] [place], [place] [address]  CBC with Differential     Status: Abnormal   Collection Time: 08/16/20  7:45 PM  Result Value Ref Range   WBC 10.8 (H) 4.0 - 10.5 K/uL   RBC 3.76 (L) 4.22 - 5.81 MIL/uL   Hemoglobin 11.9 (L) 13.0 - 17.0 g/dL   HCT 36.2 (L) 39 - 52 %   MCV 96.3 80.0 - 100.0 fL   MCH 31.6 26.0 - 34.0 pg  MCHC 32.9 30.0 - 36.0 g/dL   RDW 13.3 11.5 - 15.5 %   Platelets 194 150 - 400 K/uL   nRBC 0.0 0.0 - 0.2 %   Neutrophils Relative % 72 %   Neutro Abs 7.8 (H) 1.7 - 7.7 K/uL   Lymphocytes Relative 16 %   Lymphs Abs 1.8 0.7 - 4.0 K/uL   Monocytes Relative 9 %   Monocytes Absolute 0.9 0 - 1 K/uL   Eosinophils Relative 2 %   Eosinophils Absolute 0.2 0 - 0 K/uL   Basophils Relative 1 %   Basophils Absolute 0.1 0 - 0 K/uL   Immature Granulocytes 0 %   Abs Immature Granulocytes 0.04 0.00 - 0.07 K/uL    Comment: Performed at [facility], [address] [place], [place] [address]  Hemoglobin A1c     Status: Abnormal   Collection Time: 08/16/20  7:45 PM  Result Value Ref Range   Hgb A1c MFr Bld 8.7 (H) 4.8 - 5.6 %    Comment: (NOTE) Pre diabetes:          5.7%-6.4%  Diabetes:              >6.4%  Glycemic control for   <7.0% adults with diabetes    Mean Plasma Glucose 202.99 mg/dL    Comment: Performed at [facility], [place] [address], [place], [place] [address]  Culture, blood (routine x 2)     Status: None (Preliminary result)   Collection Time: 08/17/20 12:41 AM   Specimen: Right Antecubital; Blood  Result Value Ref Range   Specimen Description RIGHT ANTECUBITAL    Special Requests      BOTTLES DRAWN AEROBIC AND ANAEROBIC Blood Culture adequate volume   Culture      NO GROWTH 1 DAY Performed at [facility], [address] [place], [place] [address]     Report Status PENDING   Culture, blood (routine x 2)     Status: None (Preliminary result)   Collection Time: 08/17/20 12:41 AM   Specimen: BLOOD LEFT HAND  Result Value Ref Range   Specimen Description BLOOD LEFT HAND    Special Requests      BOTTLES DRAWN AEROBIC AND ANAEROBIC Blood Culture results may not be optimal due to an excessive volume of blood received in culture bottles   Culture      NO GROWTH 1 DAY Performed at [facility], [address], [place], [place] [address]    Report Status PENDING   Lactic acid, plasma     Status: None   Collection Time: 08/17/20 12:41 AM  Result Value Ref Range   Lactic Acid, Venous 1.7 0.5 - 1.9 mmol/L    Comment: Performed at [facility], [address], [place], [place] [address]  Respiratory Panel by RT PCR (Flu A&B, Covid) - Nasopharyngeal Swab     Status: None   Collection Time: 08/17/20  3:39 AM   Specimen: Nasopharyngeal Swab  Result Value Ref Range   SARS Coronavirus 2 by RT PCR NEGATIVE NEGATIVE    Comment: (NOTE) SARS-CoV-2 target nucleic acids are NOT DETECTED.  The SARS-CoV-2 RNA is generally detectable in upper respiratoy specimens during the acute phase of infection. The lowest concentration of SARS-CoV-2 viral copies this assay can detect is 131 copies/mL. A negative result does not preclude SARS-Cov-2 infection and should not be used as the sole basis for treatment or other patient management decisions. A negative result may occur with  improper specimen collection/handling, submission of specimen other than nasopharyngeal swab, presence of viral mutation(s) within the areas targeted by this assay, and inadequate number of viral copies (<131 copies/mL). A negative result must be combined with clinical observations, patient history, and epidemiological information. The expected result is Negative.  Fact Sheet for Patients:  PinkCheek.be  Fact Sheet for Healthcare  Providers:  GravelBags.it  This test is no t yet approved or cleared by the [place] FDA and  has been authorized for detection and/or diagnosis of SARS-CoV-2 by FDA under an Emergency Use Authorization (EUA). This EUA will remain  in effect (meaning this test can be used) for the duration of the COVID-19 declaration under Section 564(b)(1) of the Act, 21 U.S.C. section 360bbb-3(b)(1), unless the authorization is terminated or revoked sooner.     Influenza A by PCR NEGATIVE NEGATIVE   Influenza B by PCR NEGATIVE NEGATIVE    Comment: (NOTE) The Xpert Xpress SARS-CoV-2/FLU/RSV assay is intended as an aid in  the diagnosis of influenza from Nasopharyngeal swab specimens and  should not be used as a sole basis for treatment. Nasal washings and  aspirates are unacceptable for Xpert Xpress SARS-CoV-2/FLU/RSV  testing.  Fact Sheet for Patients: PinkCheek.be  Fact Sheet for Healthcare Providers: GravelBags.it  This test is not yet approved or cleared by the [place] FDA and  has been authorized for detection and/or diagnosis of SARS-CoV-2 by  FDA under an Emergency Use Authorization (EUA). This EUA will remain  in effect (meaning this test can be used) for the duration of the  Covid-19 declaration under Section 564(b)(1) of the Act, 21  U.S.C. section 360bbb-3(b)(1), unless the authorization is  terminated or revoked. Performed at [facility], [address] [place], [place] [address]   HIV Antibody (routine testing w rflx)     Status: None   Collection Time: 08/17/20  6:07 AM  Result Value Ref Range   HIV Screen 4th Generation wRfx Non Reactive Non Reactive    Comment: Performed at [facility], [place] [address], [place], [place] [address]  Glucose, capillary     Status: Abnormal   Collection Time: 08/17/20  9:08 AM  Result Value Ref Range   Glucose-Capillary 145 (H) 70  - 99 mg/dL    Comment: Glucose reference range applies only to samples taken after fasting for at least 8 hours.  Brain natriuretic peptide     Status: Abnormal   Collection Time: 08/17/20 11:04 AM  Result Value Ref Range   B Natriuretic Peptide 187.8 (H) 0.0 - 100.0 pg/mL    Comment: Performed at [facility], [address] [place], [place] [address]  Glucose, capillary     Status: Abnormal   Collection Time: 08/17/20 12:22 PM  Result Value Ref Range   Glucose-Capillary 221 (H) 70 - 99 mg/dL    Comment: Glucose reference range applies only to samples taken after fasting for at least 8 hours.  Glucose, capillary     Status: Abnormal   Collection Time: 08/17/20  5:15 PM  Result Value Ref Range   Glucose-Capillary 150 (H) 70 - 99 mg/dL    Comment: Glucose reference range applies only to samples taken after fasting for at least 8 hours.  Glucose, capillary     Status: Abnormal   Collection Time: 08/17/20 11:27 PM  Result Value Ref Range   Glucose-Capillary 152 (H) 70 - 99 mg/dL    Comment: Glucose reference range applies only to samples taken after  fasting for at least 8 hours.  Basic metabolic panel     Status: Abnormal   Collection Time: 08/18/20  5:21 AM  Result Value Ref Range   Sodium 140 135 - 145 mmol/L   Potassium 4.2 3.5 - 5.1 mmol/L   Chloride 111 98 - 111 mmol/L   CO2 21 (L) 22 - 32 mmol/L   Glucose, Bld 88 70 - 99 mg/dL    Comment: Glucose reference range applies only to samples taken after fasting for at least 8 hours.   BUN 71 (H) 6 - 20 mg/dL   Creatinine, Ser 3.55 (H) 0.61 - 1.24 mg/dL   Calcium 7.8 (L) 8.9 - 10.3 mg/dL   GFR calc non Af Amer 18 (L) >60 mL/min   GFR calc Af Amer 21 (L) >60 mL/min   Anion gap 8 5 - 15    Comment: Performed at [facility], [address] [place], [place] [address]  CBC     Status: Abnormal   Collection Time: 08/18/20  5:21 AM  Result Value Ref Range   WBC 6.8 4.0 - 10.5 K/uL   RBC 3.34 (L) 4.22 - 5.81  MIL/uL   Hemoglobin 10.5 (L) 13.0 - 17.0 g/dL   HCT 31.8 (L) 39 - 52 %   MCV 95.2 80.0 - 100.0 fL   MCH 31.4 26.0 - 34.0 pg   MCHC 33.0 30.0 - 36.0 g/dL   RDW 13.2 11.5 - 15.5 %   Platelets 150 150 - 400 K/uL   nRBC 0.0 0.0 - 0.2 %    Comment: Performed at [facility], [address] [place], [place] [address]  Glucose, capillary     Status: None   Collection Time: 08/18/20  7:55 AM  Result Value Ref Range   Glucose-Capillary 88 70 - 99 mg/dL    Comment: Glucose reference range applies only to samples taken after fasting for at least 8 hours.   US ARTERIAL ABI (SCREENING LOWER EXTREMITY)  Result Date: 08/17/2020 CLINICAL DATA:  Previous left toe amputations. Diabetes, hypertension, obesity EXAM: NONINVASIVE PHYSIOLOGIC VASCULAR STUDY OF BILATERAL LOWER EXTREMITIES TECHNIQUE: Evaluation of both lower extremities were performed at rest, including calculation of ankle-brachial indices with single level Doppler, pressure and pulse volume recording. COMPARISON:  None available FINDINGS: Right ABI:  1.16 Left ABI:  1.10 Right Lower Extremity:  Multiphasic waveforms at the ankle. Left Lower Extremity: Multiphasic distal posterior tibial arterial waveforms. IMPRESSION: No evidence of hemodynamically significant lower extremity arterial occlusive disease at rest. ABIs may be artificially elevated in diabetics secondary to incompressible vessel calcifications (medial arterial sclerosis of [place]). Electronically Signed   By: Lucrezia Europe M.D.   On: 08/17/2020 11:07    Blood pressure (!) 157/59, pulse 85, temperature 97.9 F (36.6 C), temperature source Oral, resp. rate 16, height 6\' 1"  (1.854 m), weight 122.5 kg, SpO2 99 %.  Assessment 1. 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 DM2, uncontrolled with polyneuropathy 3. PVD 4. Hx L TMA  Plan -Patient seen and examined today. -Wound probes to bone at the R 5th MTPJ and x-rays from clinic revealed evidence of  OM of the 5th MTPJ with associated ulceration, no gas in tissues. -Wound culture taken today and to be sent off by nursing staff. -Applied Betadine wet-to-dry dressing.  Dressing changes ordered daily until surgery. -Discussed all treatment options with the patient both conservative and surgical attempts at correction include potential risks and complications.  At this time patient is  elected for surgery consisting of right partial fifth ray amputation. -Patient have surgery tomorrow 08/19/2020 in the late afternoon.  Patient to be n.p.o. at 8 AM on 08/19/2020 for surgery. -Appreciate recs for IV Abx per medicine.  Will plan to take another intraop culture and path specimen.  WBC 6.8 today WNL, pt AFVSS, no signs of sepsis based on labs or previous clinical appearance.  Improved cellulitis to foot today. -Reviewed arterial Dopplers which appeared to be within normal limits.  Patient does have some calcification of arteries to indicate some mild degree of peripheral vascular disease.  Patient has no signs of claudication and has palpable pulses.  Do not believe vascular consultation is needed at this time. -Patient to be partial weightbearing in the surgical shoe with heel contact.  Order placed. -Hold anticoags 24hr prior to procedure and restart 24hr after procedure.  Caroline More, DPM

## 2020-08-18 NOTE — ED Notes (Signed)
Patient is resting comfortably. Breathing even and unlabored. Will continue to monitor. 

## 2020-08-19 ENCOUNTER — Inpatient Hospital Stay: Payer: Medicare HMO

## 2020-08-19 ENCOUNTER — Inpatient Hospital Stay: Payer: Medicare HMO | Admitting: Anesthesiology

## 2020-08-19 ENCOUNTER — Encounter
Admission: EM | Disposition: A | Discharge status: Home / Self Care | Payer: Self-pay | Source: Home / Self Care | Attending: Internal Medicine

## 2020-08-19 ENCOUNTER — Other Ambulatory Visit: Payer: Self-pay

## 2020-08-19 DIAGNOSIS — N189 Chronic kidney disease, unspecified: Secondary | ICD-10-CM

## 2020-08-19 DIAGNOSIS — N179 Acute kidney failure, unspecified: Secondary | ICD-10-CM

## 2020-08-19 HISTORY — PX: AMPUTATION: SHX166

## 2020-08-19 LAB — BASIC METABOLIC PANEL
Anion gap: 11 (ref 5–15)
BUN: 81 mg/dL — ABNORMAL HIGH (ref 6–20)
CO2: 22 mmol/L (ref 22–32)
Calcium: 8.8 mg/dL — ABNORMAL LOW (ref 8.9–10.3)
Chloride: 105 mmol/L (ref 98–111)
Creatinine, Ser: 4.21 mg/dL — ABNORMAL HIGH (ref 0.61–1.24)
GFR calc Af Amer: 17 mL/min — ABNORMAL LOW (ref >60)
GFR calc non Af Amer: 15 mL/min — ABNORMAL LOW (ref >60)
Glucose, Bld: 141 mg/dL — ABNORMAL HIGH (ref 70–99)
Potassium: 4.7 mmol/L (ref 3.5–5.1)
Sodium: 138 mmol/L (ref 135–145)

## 2020-08-19 LAB — GLUCOSE, CAPILLARY
Glucose-Capillary: 137 mg/dL — ABNORMAL HIGH (ref 70–99)
Glucose-Capillary: 159 mg/dL — ABNORMAL HIGH (ref 70–99)
Glucose-Capillary: 157 mg/dL — ABNORMAL HIGH (ref 70–99)
Glucose-Capillary: 117 mg/dL — ABNORMAL HIGH (ref 70–99)
Glucose-Capillary: 196 mg/dL — ABNORMAL HIGH (ref 70–99)

## 2020-08-19 LAB — MRSA PCR SCREENING: MRSA by PCR: NEGATIVE

## 2020-08-19 SURGERY — AMPUTATION, FOOT, RAY
Anesthesia: General | Site: Foot | Laterality: Right

## 2020-08-19 MED ORDER — LIDOCAINE HCL (CARDIAC) PF 100 MG/5ML IV SOSY
PREFILLED_SYRINGE | INTRAVENOUS | Status: DC | PRN
  Administered 2020-08-19: 100 mg via INTRAVENOUS

## 2020-08-19 MED ORDER — MIDAZOLAM HCL 2 MG/2ML IJ SOLN
INTRAMUSCULAR | Status: DC | PRN
  Administered 2020-08-19: 2 mg via INTRAVENOUS

## 2020-08-19 MED ORDER — FENTANYL CITRATE (PF) 100 MCG/2ML IJ SOLN
INTRAMUSCULAR | Status: AC
  Filled 2020-08-19: qty 2

## 2020-08-19 MED ORDER — VANCOMYCIN HCL 1000 MG IV SOLR
INTRAVENOUS | Status: AC
  Filled 2020-08-19: qty 1000

## 2020-08-19 MED ORDER — PROPOFOL 10 MG/ML IV BOLUS
INTRAVENOUS | Status: DC | PRN
  Administered 2020-08-19: 150 mg via INTRAVENOUS

## 2020-08-19 MED ORDER — BUPIVACAINE HCL (PF) 0.5 % IJ SOLN
INTRAMUSCULAR | Status: AC
  Filled 2020-08-19: qty 30

## 2020-08-19 MED ORDER — BUPIVACAINE HCL 0.5 % IJ SOLN
INTRAMUSCULAR | Status: DC | PRN
  Administered 2020-08-19: 20 mL
  Administered 2020-08-19: 10 mL

## 2020-08-19 MED ORDER — SODIUM CHLORIDE 0.9 % IV SOLN: INTRAVENOUS | Status: DC

## 2020-08-19 MED ORDER — DEXAMETHASONE SODIUM PHOSPHATE 10 MG/ML IJ SOLN
INTRAMUSCULAR | Status: AC
  Filled 2020-08-19: qty 1

## 2020-08-19 MED ORDER — ONDANSETRON HCL 4 MG/2ML IJ SOLN
INTRAMUSCULAR | Status: DC | PRN
  Administered 2020-08-19: 4 mg via INTRAVENOUS

## 2020-08-19 MED ORDER — EPHEDRINE SULFATE 50 MG/ML IJ SOLN
INTRAMUSCULAR | Status: DC | PRN
  Administered 2020-08-19 (×4): 5 mg via INTRAVENOUS

## 2020-08-19 MED ORDER — FENTANYL CITRATE (PF) 100 MCG/2ML IJ SOLN
INTRAMUSCULAR | Status: DC | PRN
Start: 2020-08-19 — End: 2020-08-19
  Administered 2020-08-19 (×2): 25 ug via INTRAVENOUS

## 2020-08-19 MED ORDER — ONDANSETRON HCL 4 MG/2ML IJ SOLN
INTRAMUSCULAR | Status: AC
  Filled 2020-08-19: qty 2

## 2020-08-19 MED ORDER — FENTANYL CITRATE (PF) 100 MCG/2ML IJ SOLN: 25.0000 ug | INTRAMUSCULAR | Status: DC | PRN

## 2020-08-19 MED ORDER — OXYCODONE HCL 5 MG PO TABS: 5.0000 mg | ORAL_TABLET | Freq: Once | ORAL | Status: DC | PRN

## 2020-08-19 MED ORDER — ONDANSETRON HCL 4 MG/2ML IJ SOLN: 4.0000 mg | Freq: Once | INTRAMUSCULAR | Status: DC | PRN

## 2020-08-19 MED ORDER — PHENYLEPHRINE HCL (PRESSORS) 10 MG/ML IV SOLN
INTRAVENOUS | Status: DC | PRN
  Administered 2020-08-19 (×5): 100 ug via INTRAVENOUS

## 2020-08-19 MED ORDER — NEOMYCIN-POLYMYXIN B GU 40-200000 IR SOLN
Status: AC
  Filled 2020-08-19: qty 2

## 2020-08-19 MED ORDER — OXYCODONE HCL 5 MG/5ML PO SOLN: 5.0000 mg | Freq: Once | ORAL | Status: DC | PRN

## 2020-08-19 MED ORDER — MIDAZOLAM HCL 2 MG/2ML IJ SOLN
INTRAMUSCULAR | Status: AC
  Filled 2020-08-19: qty 2

## 2020-08-19 MED ORDER — CHLORHEXIDINE GLUCONATE 4 % EX LIQD: 60.0000 mL | Freq: Once | CUTANEOUS | Status: DC

## 2020-08-19 MED ORDER — ENSURE PRE-SURGERY PO LIQD
296.0000 mL | Freq: Once | ORAL | Status: DC
  Filled 2020-08-19: qty 296

## 2020-08-19 MED ORDER — TORSEMIDE 20 MG PO TABS: 40.0000 mg | ORAL_TABLET | Freq: Every day | ORAL | Status: DC

## 2020-08-19 MED ORDER — SODIUM CHLORIDE 0.9 % IV SOLN
2.0000 g | INTRAVENOUS | Status: DC
  Filled 2020-08-19: qty 2

## 2020-08-19 MED ORDER — PROPOFOL 10 MG/ML IV BOLUS
INTRAVENOUS | Status: AC
  Filled 2020-08-19: qty 20

## 2020-08-19 MED ORDER — VANCOMYCIN HCL 1750 MG/350ML IV SOLN
1750.0000 mg | INTRAVENOUS | Status: DC
  Filled 2020-08-19: qty 350

## 2020-08-19 MED ORDER — NEOMYCIN-POLYMYXIN B GU 40-200000 IR SOLN
Status: DC | PRN
  Administered 2020-08-19: 2 mL

## 2020-08-19 MED ORDER — LIDOCAINE HCL (PF) 2 % IJ SOLN
INTRAMUSCULAR | Status: AC
  Filled 2020-08-19: qty 5

## 2020-08-19 SURGICAL SUPPLY — 48 items
BLADE MED AGGRESSIVE (BLADE) ×3 IMPLANT
BLADE OSC/SAGITTAL MD 5.5X18 (BLADE) ×2 IMPLANT
BLADE SURG 15 STRL LF DISP TIS (BLADE) IMPLANT
BLADE SURG 15 STRL SS (BLADE) ×4
BLADE SURG MINI STRL (BLADE) IMPLANT
BNDG CONFORM 2 STRL LF (GAUZE/BANDAGES/DRESSINGS) IMPLANT
BNDG ELASTIC 4X5.8 VLCR STR LF (GAUZE/BANDAGES/DRESSINGS) ×3 IMPLANT
BNDG ESMARK 4X12 TAN STRL LF (GAUZE/BANDAGES/DRESSINGS) ×3 IMPLANT
BNDG GAUZE 4.5X4.1 6PLY STRL (MISCELLANEOUS) ×3 IMPLANT
CANISTER SUCT 1200ML W/VALVE (MISCELLANEOUS) ×3 IMPLANT
CNTNR SPEC 2.5X3XGRAD LEK (MISCELLANEOUS) ×1
CONT SPEC 4OZ STER OR WHT (MISCELLANEOUS) ×2
CONTAINER SPEC 2.5X3XGRAD LEK (MISCELLANEOUS) ×1 IMPLANT
COVER WAND RF STERILE (DRAPES) ×3 IMPLANT
CUFF TOURN DUAL PL 12 NO SLV (MISCELLANEOUS) IMPLANT
CUFF TOURN SGL QUICK 18X4 (TOURNIQUET CUFF) ×2 IMPLANT
DRAPE FLUOR MINI C-ARM 54X84 (DRAPES) IMPLANT
DURAPREP 26ML APPLICATOR (WOUND CARE) ×5 IMPLANT
ELECT REM PT RETURN 9FT ADLT (ELECTROSURGICAL) ×3
ELECTRODE REM PT RTRN 9FT ADLT (ELECTROSURGICAL) ×1 IMPLANT
GAUZE SPONGE 4X4 12PLY STRL (GAUZE/BANDAGES/DRESSINGS) ×6 IMPLANT
GAUZE XEROFORM 1X8 LF (GAUZE/BANDAGES/DRESSINGS) ×3 IMPLANT
GLOVE BIO SURGEON STRL SZ7 (GLOVE) ×3 IMPLANT
GLOVE INDICATOR 7.0 STRL GRN (GLOVE) ×3 IMPLANT
GOWN STRL REUS W/ TWL LRG LVL3 (GOWN DISPOSABLE) ×2 IMPLANT
GOWN STRL REUS W/TWL LRG LVL3 (GOWN DISPOSABLE) ×4
HANDPIECE VERSAJET DEBRIDEMENT (MISCELLANEOUS) IMPLANT
KIT TURNOVER KIT A (KITS) ×3 IMPLANT
LABEL OR SOLS (LABEL) ×3 IMPLANT
NDL FILTER BLUNT 18X1 1/2 (NEEDLE) ×1 IMPLANT
NDL HYPO 25X1 1.5 SAFETY (NEEDLE) ×2 IMPLANT
NEEDLE FILTER BLUNT 18X 1/2SAF (NEEDLE) ×4
NEEDLE FILTER BLUNT 18X1 1/2 (NEEDLE) ×2 IMPLANT
NEEDLE HYPO 25X1 1.5 SAFETY (NEEDLE) ×6 IMPLANT
NS IRRIG 500ML POUR BTL (IV SOLUTION) ×3 IMPLANT
PACK EXTREMITY (MISCELLANEOUS) ×3 IMPLANT
SOL .9 NS 3000ML IRR  AL (IV SOLUTION)
SOL .9 NS 3000ML IRR UROMATIC (IV SOLUTION) IMPLANT
SOL PREP PVP 2OZ (MISCELLANEOUS) ×3
SOLUTION PREP PVP 2OZ (MISCELLANEOUS) ×1 IMPLANT
STOCKINETTE STRL 6IN 960660 (GAUZE/BANDAGES/DRESSINGS) ×3 IMPLANT
SUT ETHILON 3-0 FS-10 30 BLK (SUTURE) ×6
SUT VIC AB 3-0 SH 27 (SUTURE) ×2
SUT VIC AB 3-0 SH 27X BRD (SUTURE) ×1 IMPLANT
SUTURE EHLN 3-0 FS-10 30 BLK (SUTURE) ×2 IMPLANT
SWAB CULTURE AMIES ANAERIB BLU (MISCELLANEOUS) IMPLANT
SYR 10ML LL (SYRINGE) ×3 IMPLANT
SYR BULB IRRIG 60ML STRL (SYRINGE) ×2 IMPLANT

## 2020-08-19 NOTE — Anesthesia Preprocedure Evaluation (Addendum)
Anesthesia Evaluation  Patient identified by MRN, date of birth, ID band Patient awake    Reviewed: Allergy & Precautions, H&P , NPO status , Patient's Chart, lab work & pertinent test results  History of Anesthesia Complications Negative for: history of anesthetic complications  Airway Mallampati: III  TM Distance: >3 FB Neck ROM: full   Comment: Large neck Dental  (+) Chipped   Pulmonary neg pulmonary ROS, Sleep apnea: never been tested. , neg COPD,    breath sounds clear to auscultation       Cardiovascular hypertension, (-) angina+ CAD, + Past MI, + CABG, + Peripheral Vascular Disease and +CHF  + dysrhythmias (reports his ICD was placed due to suspected arrhythmia)  Rhythm:regular Rate:Normal  Echo 06/30/19: 1. The left ventricle has moderate-severely reduced systolic function,  with an ejection fraction of 30-35%. The cavity size was normal. There is  mildly increased left ventricular wall thickness. Left ventricular  diastolic Doppler parameters are  consistent with pseudonormalization. Left ventricular diffuse hypokinesis.  2. The right ventricle has normal systolic function. The cavity was  normal. There is no increase in right ventricular wall thickness. Unable  to estimate RVSP.  3. Left atrial size was moderately dilated.    Neuro/Psych negative neurological ROS  negative psych ROS   GI/Hepatic negative GI ROS, Neg liver ROS,   Endo/Other  diabetes  Renal/GU Renal disease (CKD)     Musculoskeletal   Abdominal   Peds  Hematology negative hematology ROS (+)   Anesthesia Other Findings Osteomyelitis  Past Medical History: No date: CAD (coronary artery disease)     Comment:  a. 04/2014 CABG x 4 (LIMA->LAD, VG->Diag, VG->OM,               VG->PDA). No date: Chronic systolic CHF (congestive heart failure) ([place])     Comment:  a. 07/2014 Echo: EF 30-35%. No date: CKD (chronic kidney disease), stage III  ([place])     Comment:  a. 12/2015 Creat 1.8. No date: Diabetic foot ulcers ([place])     Comment:  a. left foot 2nd digit ant 5 th digit 05/03/14; b.               08/2014 s/p amputation of toes on L foot. No date: Hypercholesterolemia     Comment:  a. 12/2015 TC 116, TG 154, HDL 24, LDL 61-->Atorvastatin               40. No date: Hypertension No date: Hypertensive heart disease No date: Ischemic cardiomyopathy     Comment:  a. 07/2013 Echo: EF 20%; b. 07/2014 Echo: EF 30-35%, diff               HK, Gr1 DD, mildly dil LA, nl PASP. No date: Myocardial infarction [facility]) No date: Neuropathy in diabetes ([place]) No date: Open wound     Comment:  foot No date: Orthostatic hypotension No date: Peripheral vascular disease ([place]) No date: Type II diabetes mellitus ([place])     Comment:  a. 12/2015 HbA1c = 13.9.  Past Surgical History: 01/22/2017: ACHILLES TENDON SURGERY; Right     Comment:  Procedure: ACHILLES LENGTHENING/KIDNER/TAL/Teno achilles              lengthening;  Surgeon: Samara Deist, DPM;  Location:               [facility];  Service: Podiatry;  Laterality: Right; No date: CARDIAC CATHETERIZATION     Comment:  03/2014 No date: CATARACT EXTRACTION 05/07/2014: CORONARY  ARTERY BYPASS GRAFT; N/A     Comment:  Procedure: CORONARY ARTERY BYPASS GRAFTING (CABG);                Surgeon: Gaye Pollack, MD;  Location: [place];  Service:               Open Heart Surgery;  Laterality: N/A;  Times 4 using left              internal mammary artery and endoscopically harvested               right saphenous vein 08/04/2019: DIALYSIS/PERMA CATHETER INSERTION; N/A     Comment:  Procedure: DIALYSIS/PERMA CATHETER INSERTION;  Surgeon:               Katha Cabal, MD;  Location: [place] [facility];               Service: Cardiovascular;  Laterality: N/A; 08/08/2019: DIALYSIS/PERMA CATHETER REMOVAL; N/A     Comment:  Procedure: DIALYSIS/PERMA CATHETER REMOVAL;  Surgeon:               Katha Cabal, MD;   Location: [place] [facility];               Service: Cardiovascular;  Laterality: N/A; 10/04/2019: ICD IMPLANT; N/A     Comment:  Procedure: ICD IMPLANT;  Surgeon: Deboraha Sprang, MD;                Location: [place] [facility];  Service: Cardiovascular;                Laterality: N/A; 05/07/2014: INTRAOPERATIVE TRANSESOPHAGEAL ECHOCARDIOGRAM; N/A     Comment:  Procedure: INTRAOPERATIVE TRANSESOPHAGEAL               ECHOCARDIOGRAM;  Surgeon: Gaye Pollack, MD;  Location:               [place] [facility];  Service: Open Heart Surgery;  Laterality: N/A; No date: left foot osteomyelitis and wound after surgery No date: TOE AMPUTATION     Comment:  Left 3 and 4 toes No date: TONSILECTOMY/ADENOIDECTOMY WITH MYRINGOTOMY  BMI    Body Mass Index: 35.62 kg/m      Reproductive/Obstetrics negative OB ROS                            Anesthesia Physical Anesthesia Plan  ASA: III  Anesthesia Plan: General LMA   Post-op Pain Management:    Induction:   PONV Risk Score and Plan: Dexamethasone, Ondansetron, Midazolam and Treatment may vary due to age or medical condition  Airway Management Planned:   Additional Equipment:   Intra-op Plan:   Post-operative Plan:   Informed Consent: I have reviewed the patients History and Physical, chart, labs and discussed the procedure including the risks, benefits and alternatives for the proposed anesthesia with the patient or authorized representative who has indicated his/her understanding and acceptance.     Dental Advisory Given  Plan Discussed with: Anesthesiologist, CRNA and Surgeon  Anesthesia Plan Comments:         Anesthesia Quick Evaluation

## 2020-08-19 NOTE — Op Note (Signed)
PODIATRY / FOOT AND ANKLE SURGERY OPERATIVE REPORT    SURGEON: Caroline More, DPM  PRE-OPERATIVE DIAGNOSIS:  1.  Right fifth metatarsal phalangeal joint osteomyelitis 2.  Cellulitis right foot 3.  Diabetes type 2 polyneuropathy, uncontrolled 4.  PVD  POST-OPERATIVE DIAGNOSIS: Same  PROCEDURE(S): 1. Right partial fifth ray amputation  HEMOSTASIS: Right ankle tourniquet  ANESTHESIA: MAC  ESTIMATED BLOOD LOSS: 10 cc  FINDING(S): 1.  Obliterated joint at the right first metatarsal phalangeal joint consistent with osteomyelitic changes  PATHOLOGY/SPECIMEN(S): Right partial fifth ray amputation site bone culture, path specimen with proximal margin marked in purple ink  INDICATIONS:   [name] [name] is a 59 y.o. male who presents with a nonhealing ulceration to the plantar aspect of the right foot fifth metatarsal phalangeal joint.  Patient was seen in clinic this past Friday about 2 to 3 days ago after he had redness and swelling to the lateral aspect of the right foot.  Patient also had associated plantar ulceration which probes to bone.  X-rays were taken in clinic showing osteomyelitic changes to the fifth metatarsal phalangeal joint.  Due to concern for infection that could potentially get worse patient was sent to the emergency room and admitted to [facility] for further care and evaluation.  Patient presents today for surgical intervention consisting of right partial fifth ray amputation.  ABIs on admission showed relatively normal blood flow overall with slight elevation likely due to calcinosis of arteries, waveforms appear to be within normal limits.  DESCRIPTION: After obtaining full informed written consent, the patient was brought back to the operating room and placed supine upon the operating table.  A pneumatic ankle tourniquet was applied about the right ankle.  The patient received IV antibiotics prior to induction.  After obtaining adequate anesthesia,  a preoperative block was performed with 10 cc of half percent Marcaine plain.  The patient was prepped and draped in the standard fashion.  An Esmarch bandage was used to exsanguinate the right lower extremity and the pneumatic ankle tourniquet was inflated.  Attention was then directed to the right fifth metatarsal phalangeal joint area where a fishmouth or racquet type of incision was made over the fifth metatarsal phalangeal joint and extended up the fifth metatarsal shaft to the midshaft portion.  The incision was made straight to bone.  The extensor tendon as well as dorsal joint capsule and all capsular tissues were incised and released at the fifth metatarsal phalangeal joint along with any attachment to the plantar plate and flexor tendons.  The fifth toe was disarticulated and passed off the operative site.  Bone culture was taken from the fifth metatarsal phalangeal joint passed off the operative site and sent off his bone culture.  The fifth metatarsal phalangeal joint appeared to be grossly infected overall with multiple bony fragments present consistent with osteomyelitic changes.  The bone appeared to be very soft in nature at this level as well also consistent with osteomyelitis.  Minimal to no purulence that was able to be expressed from the site.  Circumferential dissection was then performed around the fifth metatarsal head and distal shaft.  A sagittal bone saw was then used to resect the fifth metatarsal at the level of the midshaft with the appropriate beveling.  The distal fragments were then removed and passed off the operative site.  The proximal margin of the most proximal area of the bone that was resected was marked in purple ink and the pathology was passed off the  operative site and sent off his right partial fifth ray amputation with proximal margin marked in purple ink.  The plantar plate was resected and passed off from the operative site as well as any tendinous debris to the area.   The surgical site was flushed with copious amounts normal sterile saline.  The deep structures then reapproximated well coapted with 3-0 Vicryl.  The skin was then reapproximated well coapted with 3-0 nylon in a combination of simple and horizontal mattress type stitching.  The pneumatic ankle tourniquet was deflated and a prompt hyperemic response was noted all digits of the right foot.  A postoperative block was performed with 10 cc of half percent Marcaine plain.  A postoperative dressing was applied consisting of Xeroform followed by 4 x 4 gauze, Kerlix, Ace wrap.  The patient tolerated the procedure and anesthesia well was transferred to recovery room vital signs stable vascular status intact all toes the right foot.  Patient will be discharged back to the inpatient room with the appropriate orders.  We will reassess patient's incision site again tomorrow.  Patient will likely be set up for discharge on Wednesday if stable.  COMPLICATIONS: none  CONDITION: Good, stable  Caroline More, DPM

## 2020-08-19 NOTE — H&P (Signed)
HISTORY AND PHYSICAL INTERVAL NOTE:  08/19/2020  4:37 PM  [name] [name]  has presented today for surgery, with the diagnosis of R 5TH MTPJ OSTEOMYELITIS WITH ASSOCIATED CELLULITIS.  The various methods of treatment have been discussed with the patient.  No guarantees were given.  After consideration of risks, benefits and other options for treatment, the patient has consented to surgery.  I have reviewed the patients' chart and labs.    PROCEDURE: RIGHT PARTIAL 5TH RAY AMPUTATION  A history and physical examination was performed in hospital/office.  The patient was reexamined.  There have been no changes to this history and physical examination.  Caroline More, DPM

## 2020-08-19 NOTE — Progress Notes (Signed)
[facility]  ROUNDING NOTE   Subjective:   [name] [name] is 59 years old gentleman, with past medical history of CAD, hypertension, ICM with EF 30 to 35%, CKD stage III, came in to the hospital for IV antibiotics and possible surgical debridement of his right metatarsal phalangeal joint which has osteomyelitis.  Patient is consulted to nephrology for AKI. Patient lying in bed, in no acute distress.  Right foot with dressing intact.  He denies worsening shortness of breath, nausea, vomiting,or dizziness.   Objective:  Vital signs in last 24 hours:  Temp:  [97.5 F (36.4 C)-98.1 F (36.7 C)] 97.7 F (36.5 C) (10/04 0752) Pulse Rate:  [75-87] 75 (10/04 0752) Resp:  [16-20] 17 (10/04 0752) BP: (135-177)/(64-94) 135/64 (10/04 0752) SpO2:  [97 %-100 %] 98 % (10/04 0752)  Weight change:  Filed Weights   08/16/20 1939  Weight: 122.5 kg    Intake/Output: I/O last 3 completed shifts: In: 422.9 [I.V.:12.9; IV Piggyback:410] Out: 500 [Urine:500]   Intake/Output this shift:  No intake/output data recorded.  Physical Exam: General:  Resting in bed, in no acute distress  Head: Normocephalic, atraumatic. Moist oral mucosal membranes  Eyes: Anicteric  Neck: Supple, trachea at the midline  Lungs:   Normal and symmetric respiratory effort, lungs clear but diminished at the bases  Heart:  S1S2 regular rate and rhythm  Abdomen:  Soft, nontender, nondistended  Extremities:  No peripheral edema  Neurologic:  Awake, alert, oriented  Skin:  Right foot with dressing intact    Basic Metabolic Panel: Recent Labs  Lab 08/16/20 1945 08/18/20 0521 08/19/20 0552  NA 138 140 138  K 5.0 4.2 4.7  CL 102 111 105  CO2 23 21* 22  GLUCOSE 194* 88 141*  BUN 83* 71* 81*  CREATININE 3.63* 3.55* 4.21*  CALCIUM 8.7* 7.8* 8.8*    Liver Function Tests: Recent Labs  Lab 08/16/20 1945  AST 21  ALT 17  ALKPHOS 64  BILITOT 0.8  PROT 7.9  ALBUMIN 3.8   No results for input(s):  LIPASE, AMYLASE in the last 168 hours. No results for input(s): AMMONIA in the last 168 hours.  CBC: Recent Labs  Lab 08/16/20 1945 08/18/20 0521  WBC 10.8* 6.8  NEUTROABS 7.8*  --   HGB 11.9* 10.5*  HCT 36.2* 31.8*  MCV 96.3 95.2  PLT 194 150    Cardiac Enzymes: No results for input(s): CKTOTAL, CKMB, CKMBINDEX, TROPONINI in the last 168 hours.  BNP: Invalid input(s): POCBNP  CBG: Recent Labs  Lab 08/18/20 0755 08/18/20 1336 08/18/20 1652 08/18/20 2154 08/19/20 0747  GLUCAP 88 159* 143* 193* 137*    Microbiology: Results for orders placed or performed during the hospital encounter of 08/17/20  Culture, blood (routine x 2)     Status: None (Preliminary result)   Collection Time: 08/17/20 12:41 AM   Specimen: Right Antecubital; Blood  Result Value Ref Range Status   Specimen Description RIGHT ANTECUBITAL  Final   Special Requests   Final    BOTTLES DRAWN AEROBIC AND ANAEROBIC Blood Culture adequate volume   Culture   Final    NO GROWTH 2 DAYS Performed at [facility], [address] [place], [place] [address]    Report Status PENDING  Incomplete  Culture, blood (routine x 2)     Status: None (Preliminary result)   Collection Time: 08/17/20 12:41 AM   Specimen: BLOOD LEFT HAND  Result Value Ref Range Status   Specimen Description BLOOD  LEFT HAND  Final   Special Requests   Final    BOTTLES DRAWN AEROBIC AND ANAEROBIC Blood Culture results may not be optimal due to an excessive volume of blood received in culture bottles   Culture   Final    NO GROWTH 2 DAYS Performed at [facility], [address], [place], [place] [address]    Report Status PENDING  Incomplete  Respiratory Panel by RT PCR (Flu A&B, Covid) - Nasopharyngeal Swab     Status: None   Collection Time: 08/17/20  3:39 AM   Specimen: Nasopharyngeal Swab  Result Value Ref Range Status   SARS Coronavirus 2 by RT PCR NEGATIVE NEGATIVE Final    Comment: (NOTE) SARS-CoV-2  target nucleic acids are NOT DETECTED.  The SARS-CoV-2 RNA is generally detectable in upper respiratoy specimens during the acute phase of infection. The lowest concentration of SARS-CoV-2 viral copies this assay can detect is 131 copies/mL. A negative result does not preclude SARS-Cov-2 infection and should not be used as the sole basis for treatment or other patient management decisions. A negative result may occur with  improper specimen collection/handling, submission of specimen other than nasopharyngeal swab, presence of viral mutation(s) within the areas targeted by this assay, and inadequate number of viral copies (<131 copies/mL). A negative result must be combined with clinical observations, patient history, and epidemiological information. The expected result is Negative.  Fact Sheet for Patients:  PinkCheek.be  Fact Sheet for Healthcare Providers:  GravelBags.it  This test is no t yet approved or cleared by the [place] FDA and  has been authorized for detection and/or diagnosis of SARS-CoV-2 by FDA under an Emergency Use Authorization (EUA). This EUA will remain  in effect (meaning this test can be used) for the duration of the COVID-19 declaration under Section 564(b)(1) of the Act, 21 U.S.C. section 360bbb-3(b)(1), unless the authorization is terminated or revoked sooner.     Influenza A by PCR NEGATIVE NEGATIVE Final   Influenza B by PCR NEGATIVE NEGATIVE Final    Comment: (NOTE) The Xpert Xpress SARS-CoV-2/FLU/RSV assay is intended as an aid in  the diagnosis of influenza from Nasopharyngeal swab specimens and  should not be used as a sole basis for treatment. Nasal washings and  aspirates are unacceptable for Xpert Xpress SARS-CoV-2/FLU/RSV  testing.  Fact Sheet for Patients: PinkCheek.be  Fact Sheet for Healthcare  Providers: GravelBags.it  This test is not yet approved or cleared by the [place] FDA and  has been authorized for detection and/or diagnosis of SARS-CoV-2 by  FDA under an Emergency Use Authorization (EUA). This EUA will remain  in effect (meaning this test can be used) for the duration of the  Covid-19 declaration under Section 564(b)(1) of the Act, 21  U.S.C. section 360bbb-3(b)(1), unless the authorization is  terminated or revoked. Performed at [facility], [address] [place], [place] [address]   Aerobic/Anaerobic Culture (surgical/deep wound)     Status: None (Preliminary result)   Collection Time: 08/18/20  9:38 AM   Specimen: Wound  Result Value Ref Range Status   Specimen Description   Final    WOUND Performed at [facility], [address], [place], [place] [address]    Special Requests   Final    NONE Performed at [facility], [address] [place], [place] [address]    Gram Stain   Final    RARE WBC PRESENT,BOTH PMN AND MONONUCLEAR NO ORGANISMS SEEN  Culture   Final    NORMAL SKIN FLORA Performed at [facility], [address], [place], [place] [address]    Report Status PENDING  Incomplete  MRSA PCR Screening     Status: None   Collection Time: 08/19/20  6:15 AM   Specimen: Nasal Mucosa; Nasopharyngeal  Result Value Ref Range Status   MRSA by PCR NEGATIVE NEGATIVE Final    Comment:        The GeneXpert MRSA Assay (FDA approved for NASAL specimens only), is one component of a comprehensive MRSA colonization surveillance program. It is not intended to diagnose MRSA infection nor to guide or monitor treatment for MRSA infections. Performed at [facility], [address] [place], [place] [address]     Coagulation Studies: No results for input(s): LABPROT, INR in the last 72 hours.  Urinalysis: No results for input(s): COLORURINE, LABSPEC, PHURINE, GLUCOSEU,  HGBUR, BILIRUBINUR, KETONESUR, PROTEINUR, UROBILINOGEN, NITRITE, LEUKOCYTESUR in the last 72 hours.  Invalid input(s): APPERANCEUR    Imaging: No results found.   Medications:   . sodium chloride Stopped (08/19/20 0015)  . [START ON 08/20/2020] ceFEPime (MAXIPIME) IV    . dextrose 5 % and 0.45% NaCl 50 mL/hr at 08/19/20 0913  . [START ON 08/20/2020] vancomycin     . allopurinol  100 mg Oral Daily  . amLODipine  2.5 mg Oral Daily  . atorvastatin  40 mg Oral QHS  . carvedilol  3.125 mg Oral BID WC  . ferrous sulfate  325 mg Oral Daily  . insulin aspart  0-20 Units Subcutaneous TID WC  . isosorbide mononitrate  60 mg Oral Daily  . multivitamin with minerals  1 tablet Oral Daily  . sodium bicarbonate  1,300 mg Oral BID  . [START ON 08/20/2020] torsemide  40 mg Oral Daily  . vitamin B-12  1,000 mcg Oral QHS   sodium chloride, acetaminophen **OR** acetaminophen, HYDROcodone-acetaminophen, magnesium hydroxide, morphine injection, nitroGLYCERIN, ondansetron **OR** ondansetron (ZOFRAN) IV, traZODone, zolpidem  Assessment/ Plan:  [name] [name] is a 59 y.o.  male old gentleman, with past medical history of CAD, hypertension, ICM with EF 30 to 35%, CKD stage III, came in to the hospital for IV antibiotics and possible surgical debridement of his right metatarsal phalangeal joint which has osteomyelitis.  Patient is consulted to nephrology for AKI.  # Acute kidney injury with CKD stage III  Lab Results  Component Value Date   CREATININE 4.21 (H) 08/19/2020   CREATININE 3.55 (H) 08/18/2020   CREATININE 3.63 (H) 08/16/2020   Baseline creatinine 3.25 with GFR 20 on 08/18/19 Creatinine today is 4.21, possibly from active infection and dehydration Continue hydration with IV fluids  Avoid nephrotoxic agents Will monitor closely  #DM type II HbA1c 8.7 on 08/16/2020 Patient is on long-acting and short-acting insulins at home Antihyperglycemic medications per medical  team  #Osteomyelitis of the right fifth metatarsal phalangeal joint Wound culture pending Patient is on vancomycin and cefepime Plan for amputation Management per podiatric team  #Hypertension  Blood pressure readings slightly elevated than the goal Continue on current antihypertensive regimen     LOS: 2 [name] [name] 10/4/202111:32 AM

## 2020-08-19 NOTE — Anesthesia Postprocedure Evaluation (Signed)
Anesthesia Post Note  Patient: [name] [name]  Procedure(s) Performed: AMPUTATION RAY-Right Partial 5th Ray (Right Foot)  Patient location during evaluation: PACU Anesthesia Type: General Level of consciousness: awake and alert Pain management: pain level controlled Vital Signs Assessment: post-procedure vital signs reviewed and stable Respiratory status: spontaneous breathing, nonlabored ventilation and respiratory function stable Cardiovascular status: blood pressure returned to baseline and stable Postop Assessment: no apparent nausea or vomiting Anesthetic complications: no   No complications documented.   Last Vitals:  Vitals:   08/19/20 1815 08/19/20 2031  BP: (!) 152/80 (!) 156/81  Pulse: 76 76  Resp: 11 18  Temp: (!) 36.1 C (!) 35.9 C  SpO2: 97% 99%    Last Pain:  Vitals:   08/19/20 1815  TempSrc:   PainSc: 0-No pain                 Brett Canales Yuvonne Lanahan

## 2020-08-19 NOTE — Anesthesia Procedure Notes (Signed)
Procedure Name: LMA Insertion Date/Time: 08/19/2020 4:55 PM Performed by: Hedda Slade, CRNA Pre-anesthesia Checklist: Patient identified, Patient being monitored, Timeout performed, Emergency Drugs available and Suction available Patient Re-evaluated:Patient Re-evaluated prior to induction Oxygen Delivery Method: Circle system utilized Preoxygenation: Pre-oxygenation with 100% oxygen Induction Type: IV induction Ventilation: Mask ventilation without difficulty LMA: LMA inserted LMA Size: 4.5 Tube type: Oral Number of attempts: 1 Placement Confirmation: positive ETCO2 and breath sounds checked- equal and bilateral Tube secured with: Tape Dental Injury: Teeth and Oropharynx as per pre-operative assessment

## 2020-08-19 NOTE — Transfer of Care (Signed)
Immediate Anesthesia Transfer of Care Note  Patient: BARNET BENAVIDES  Procedure(s) Performed: AMPUTATION RAY-Right Partial 5th Ray (Right Foot)  Patient Location: PACU  Anesthesia Type:General  Level of Consciousness: sedated  Airway & Oxygen Therapy: Patient Spontanous Breathing and Patient connected to face mask oxygen  Post-op Assessment: Report given to RN and Post -op Vital signs reviewed and stable  Post vital signs: Reviewed and stable  Last Vitals:  Vitals Value Taken Time  BP 151/81 08/19/20 1746  Temp 36.3 C 08/19/20 1746  Pulse 76 08/19/20 1746  Resp 14 08/19/20 1746  SpO2 100 % 08/19/20 1746  Vitals shown include unvalidated device data.  Last Pain:  Vitals:   08/19/20 1746  TempSrc:   PainSc: 0-No pain         Complications: No complications documented.

## 2020-08-19 NOTE — Progress Notes (Addendum)
PROGRESS NOTE    [name] [name]  YHC:[id number] DOB: 1961-04-16 DOA: 08/17/2020 PCP: Tracie Harrier, MD    Brief Narrative:  [name] [name] is a 59 y.o. male with medical history significant of CAD s/p CABG, chronic systolic HF, CKD III, HTN, ischemic cardiomyopathy w/ EF 30-35% Sept '15 sent in by his podiatrist for osteomyelitis.According to the patient has been dealing with this wound for about a year. Went to see his podiatrist today and had an x-ray consistent with acute osteomyelitis of the right metatarsal phalangeal joint. Patient also has had worsening erythema and warmth over the last few days. He was put on Bactrim by his podiatrist and sent over here for admission for IV antibiotics and surgical debridement    Consultants:   Podiatry, nephrology  Procedures:  Arterial ultrasound No evidence of hemodynamically significant lower extremity arterial occlusive disease at rest. ABIs may be artificially elevated in diabetics secondary to incompressible vessel calcifications (medial arterial sclerosis of Monckeberg).    Antimicrobials:   Cefepime and vancomycin   Subjective: Has no complaints this AM.  Awaiting surgery today.  Objective: Vitals:   08/18/20 2008 08/18/20 2339 08/19/20 0556 08/19/20 0752  BP: (!) 163/81 (!) 177/94 (!) 157/79 135/64  Pulse: 79 81 76 75  Resp: 20 20 20 17   Temp: 98.1 F (36.7 C) (!) 97.5 F (36.4 C) 97.9 F (36.6 C) 97.7 F (36.5 C)  TempSrc: Oral Oral Oral Oral  SpO2: 97% 99% 100% 98%  Weight:      Height:        Intake/Output Summary (Last 24 hours) at 08/19/2020 0853 Last data filed at 08/19/2020 0340 Gross per 24 hour  Intake 422.92 ml  Output --  Net 422.92 ml   Filed Weights   08/16/20 1939  Weight: 122.5 kg    Examination:  Comfortable, calm, NAD CTA, no wheeze rales rhonchi's Regular S1-S2 no murmurs rubs gallops Soft nontender nondistended positive bowel sounds Extremities no edema, right lower  extremity wrapped in dressing Alert oriented x3 grossly intact Mood and affect appropriate in current setting   Data Reviewed: I have personally reviewed following labs and imaging studies  CBC: Recent Labs  Lab 08/16/20 1945 08/18/20 0521  WBC 10.8* 6.8  NEUTROABS 7.8*  --   HGB 11.9* 10.5*  HCT 36.2* 31.8*  MCV 96.3 95.2  PLT 194 517   Basic Metabolic Panel: Recent Labs  Lab 08/16/20 1945 08/18/20 0521 08/19/20 0552  NA 138 140 138  K 5.0 4.2 4.7  CL 102 111 105  CO2 23 21* 22  GLUCOSE 194* 88 141*  BUN 83* 71* 81*  CREATININE 3.63* 3.55* 4.21*  CALCIUM 8.7* 7.8* 8.8*   GFR: Estimated Creatinine Clearance: 26.5 mL/min (A) (by C-G formula based on SCr of 4.21 mg/dL (H)). Liver Function Tests: Recent Labs  Lab 08/16/20 1945  AST 21  ALT 17  ALKPHOS 64  BILITOT 0.8  PROT 7.9  ALBUMIN 3.8   No results for input(s): LIPASE, AMYLASE in the last 168 hours. No results for input(s): AMMONIA in the last 168 hours. Coagulation Profile: No results for input(s): INR, PROTIME in the last 168 hours. Cardiac Enzymes: No results for input(s): CKTOTAL, CKMB, CKMBINDEX, TROPONINI in the last 168 hours. BNP (last 3 results) No results for input(s): PROBNP in the last 8760 hours. HbA1C: Recent Labs    08/16/20 1945  HGBA1C 8.7*   CBG: Recent Labs  Lab 08/18/20 0755 08/18/20 1336 08/18/20 1652 08/18/20 2154 08/19/20 0747  GLUCAP 88 159* 143* 193* 137*   Lipid Profile: No results for input(s): CHOL, HDL, LDLCALC, TRIG, CHOLHDL, LDLDIRECT in the last 72 hours. Thyroid Function Tests: No results for input(s): TSH, T4TOTAL, FREET4, T3FREE, THYROIDAB in the last 72 hours. Anemia Panel: No results for input(s): VITAMINB12, FOLATE, FERRITIN, TIBC, IRON, RETICCTPCT in the last 72 hours. Sepsis Labs: Recent Labs  Lab 08/17/20 0041  LATICACIDVEN 1.7    Recent Results (from the past 240 hour(s))  Culture, blood (routine x 2)     Status: None (Preliminary result)    Collection Time: 08/17/20 12:41 AM   Specimen: Right Antecubital; Blood  Result Value Ref Range Status   Specimen Description RIGHT ANTECUBITAL  Final   Special Requests   Final    BOTTLES DRAWN AEROBIC AND ANAEROBIC Blood Culture adequate volume   Culture   Final    NO GROWTH 2 DAYS Performed at [facility], [address] [place], [place] [address]    Report Status PENDING  Incomplete  Culture, blood (routine x 2)     Status: None (Preliminary result)   Collection Time: 08/17/20 12:41 AM   Specimen: BLOOD LEFT HAND  Result Value Ref Range Status   Specimen Description BLOOD LEFT HAND  Final   Special Requests   Final    BOTTLES DRAWN AEROBIC AND ANAEROBIC Blood Culture results may not be optimal due to an excessive volume of blood received in culture bottles   Culture   Final    NO GROWTH 2 DAYS Performed at [facility], [address], [place],  [address]    Report Status PENDING  Incomplete  Respiratory Panel by RT PCR (Flu A&B, Covid) - Nasopharyngeal Swab     Status: None   Collection Time: 08/17/20  3:39 AM   Specimen: Nasopharyngeal Swab  Result Value Ref Range Status   SARS Coronavirus 2 by RT PCR NEGATIVE NEGATIVE Final    Comment: (NOTE) SARS-CoV-2 target nucleic acids are NOT DETECTED.  The SARS-CoV-2 RNA is generally detectable in upper respiratoy specimens during the acute phase of infection. The lowest concentration of SARS-CoV-2 viral copies this assay can detect is 131 copies/mL. A negative result does not preclude SARS-Cov-2 infection and should not be used as the sole basis for treatment or other patient management decisions. A negative result may occur with  improper specimen collection/handling, submission of specimen other than nasopharyngeal swab, presence of viral mutation(s) within the areas targeted by this assay, and inadequate number of viral copies (<131 copies/mL). A negative result must be combined with  clinical observations, patient history, and epidemiological information. The expected result is Negative.  Fact Sheet for Patients:  PinkCheek.be  Fact Sheet for Healthcare Providers:  GravelBags.it  This test is no t yet approved or cleared by the [place] FDA and  has been authorized for detection and/or diagnosis of SARS-CoV-2 by FDA under an Emergency Use Authorization (EUA). This EUA will remain  in effect (meaning this test can be used) for the duration of the COVID-19 declaration under Section 564(b)(1) of the Act, 21 U.S.C. section 360bbb-3(b)(1), unless the authorization is terminated or revoked sooner.     Influenza A by PCR NEGATIVE NEGATIVE Final   Influenza B by PCR NEGATIVE NEGATIVE Final    Comment: (NOTE) The Xpert Xpress SARS-CoV-2/FLU/RSV assay is intended as an aid in  the diagnosis of influenza from Nasopharyngeal swab specimens and  should not be used as a sole basis for treatment. Nasal washings and  aspirates are unacceptable for Xpert Xpress SARS-CoV-2/FLU/RSV  testing.  Fact Sheet for Patients: PinkCheek.be  Fact Sheet for Healthcare Providers: GravelBags.it  This test is not yet approved or cleared by the [place] FDA and  has been authorized for detection and/or diagnosis of SARS-CoV-2 by  FDA under an Emergency Use Authorization (EUA). This EUA will remain  in effect (meaning this test can be used) for the duration of the  Covid-19 declaration under Section 564(b)(1) of the Act, 21  U.S.C. section 360bbb-3(b)(1), unless the authorization is  terminated or revoked. Performed at [facility], [address] [place], [place] [address]   Aerobic/Anaerobic Culture (surgical/deep wound)     Status: None (Preliminary result)   Collection Time: 08/18/20  9:38 AM   Specimen: Wound  Result Value Ref Range Status    Specimen Description   Final    WOUND Performed at [facility], [address] [place], [place] [address]    Special Requests   Final    NONE Performed at [facility], [address] [place], [place] [address]    Gram Stain   Final    RARE WBC PRESENT,BOTH PMN AND MONONUCLEAR NO ORGANISMS SEEN Performed at [facility], [place] [address] [place], [place] [address]    Culture PENDING  Incomplete   Report Status PENDING  Incomplete  MRSA PCR Screening     Status: None   Collection Time: 08/19/20  6:15 AM   Specimen: Nasal Mucosa; Nasopharyngeal  Result Value Ref Range Status   MRSA by PCR NEGATIVE NEGATIVE Final    Comment:        The GeneXpert MRSA Assay (FDA approved for NASAL specimens only), is one component of a comprehensive MRSA colonization surveillance program. It is not intended to diagnose MRSA infection nor to guide or monitor treatment for MRSA infections. Performed at [facility], [address], [place], [place] [address]          Radiology Studies: US ARTERIAL ABI (SCREENING LOWER EXTREMITY)  Result Date: 08/17/2020 CLINICAL DATA:  Previous left toe amputations. Diabetes, hypertension, obesity EXAM: NONINVASIVE PHYSIOLOGIC VASCULAR STUDY OF BILATERAL LOWER EXTREMITIES TECHNIQUE: Evaluation of both lower extremities were performed at rest, including calculation of ankle-brachial indices with single level Doppler, pressure and pulse volume recording. COMPARISON:  None available FINDINGS: Right ABI:  1.16 Left ABI:  1.10 Right Lower Extremity:  Multiphasic waveforms at the ankle. Left Lower Extremity: Multiphasic distal posterior tibial arterial waveforms. IMPRESSION: No evidence of hemodynamically significant lower extremity arterial occlusive disease at rest. ABIs may be artificially elevated in diabetics secondary to incompressible vessel calcifications (medial arterial sclerosis of Monckeberg). Electronically Signed   By:  Lucrezia Europe M.D.   On: 08/17/2020 11:07        Scheduled Meds: . allopurinol  100 mg Oral Daily  . amLODipine  2.5 mg Oral Daily  . atorvastatin  40 mg Oral QHS  . carvedilol  3.125 mg Oral BID WC  . ferrous sulfate  325 mg Oral Daily  . insulin aspart  0-20 Units Subcutaneous TID WC  . isosorbide mononitrate  60 mg Oral Daily  . multivitamin with minerals  1 tablet Oral Daily  . sodium bicarbonate  1,300 mg Oral BID  . torsemide  40 mg Oral Daily  . vitamin B-12  1,000 mcg Oral QHS   Continuous Infusions: . sodium chloride Stopped (08/19/20 0015)  . [START ON 08/20/2020] ceFEPime (MAXIPIME) IV    .  dextrose 5 % and 0.45% NaCl    . vancomycin Stopped (08/19/20 0013)    Assessment & Plan:   Active Problems:   Diabetes mellitus type 2 with complications (HCC)   Hypercholesterolemia   Chronic systolic heart failure (HCC)   HTN (hypertension)   Acute osteomyelitis of right foot ([place])   1. Osteomyelitis right 5 th metatarsal -present on admission Per podiatry wound probe to bone, x-ray from clinic revealed evidence of OM at fifth MTP J with associated ulceration, no gas in tissue Wound culture taken today Dressing change daily until surgery 10/4-plan for right partial fifth ray amputation today  Continue vancomycin and cefepime  Arterial Dopplers within normal limits, no vascular consultation needed at this time  Partial weightbearing in surgical shoe with heel contact order placed by podiatry anticoagulation held 24 hours prior to procedure and will restart 24 hours after procedure per podiatry Will consult ID for treatment management   2. Chronic systolic heart failure - no evidence of decompensation.  Last Echo 9/20  w/ EF ejection fraction of 30-35%  10/4-continue with Imdur, amlodipine, and carvedilol Hold torsemide today  3. DM -  A1c 8.7 Continue R ISS Hypoglycemia protocol Resume Lantus when able to eat RISS Hypoglycemic protocal             4. HLD -  continue on statin  5. HTN- stable , continue on beta-blockers, amlodipine, Imdur    6. Acute on CKD IV - creatinine mildly elevated.  Will hold diuretic today as he is going to OR Avoid nephrotoxic agents .  Nephrology following   DVT prophylaxis: scd Code Status: Full Family Communication: Wife at bedside updated  Status is: Inpatient  Remains inpatient appropriate because:IV treatments appropriate due to intensity of illness or inability to take PO   Dispo: The patient is from: Home              Anticipated d/c is to: Home              Anticipated d/c date is: 3 days              Patient currently is not medically stable to d/c.  Going to the OR today            LOS: 2 days   Time spent: 45 min with >50% on coc   Nolberto Hanlon, MD [facility] Pager [phone]  If 7PM-7AM, please contact night-coverage [email or web address] Password [email or web address] 08/19/2020, 8:53 AM

## 2020-08-19 NOTE — Progress Notes (Signed)
Pharmacy Antibiotic Note  [name] [name] is a 59 y.o. male admitted on 08/17/2020 with osteomyelitis.  Pharmacy has been consulted for Vancomycin and Cefepime dosing.  Today, 08/19/20  --Scr worse, possibly secondary to dehydration (torsemide held today) --Tentative plan for OR today for partial fifth ray amputation with podiatry  Plan: Adjust Cefepime to 2g IV q24h  Adjust Vancomycin to 1750 mg IV q48h --Goal trough 15-20 mcg/mL for osteomyelitis  Monitor clinical picture, renal function, vanc levels if needed F/U C&S, abx de-escalation, LOT  Height: 6\' 1"  (185.4 cm) Weight: 122.5 kg (270 lb) IBW/kg (Calculated) : 79.9  Temp (24hrs), Avg:97.8 F (36.6 C), Min:97.5 F (36.4 C), Max:98.1 F (36.7 C)  Recent Labs  Lab 08/16/20 1945 08/17/20 0041 08/18/20 0521 08/19/20 0552  WBC 10.8*  --  6.8  --   CREATININE 3.63*  --  3.55* 4.21*  LATICACIDVEN  --  1.7  --   --     Estimated Creatinine Clearance: 26.5 mL/min (A) (by C-G formula based on SCr of 4.21 mg/dL (H)).    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 Doesn't believe he has an allergy to PCN as he has taken it previously, according to wife.   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 Doesn't believe he has an allergy to PCN as he has taken it previously, according to wife.    Antimicrobials this admission: CTX 10/2 x1 Vancomycin 10/2 >>  Cefepime 10/2  >>   Microbiology results: 10/3 Wound Cx: Normal skin flora, pending 10/2 BCx: NGTD   Thank you for allowing pharmacy to be a part of this patients care.  Benita Gutter 08/19/2020 2:02 PM

## 2020-08-19 NOTE — Progress Notes (Signed)
Mobility Specialist - Progress Note   08/19/20 1130  Mobility  Activity Ambulated in room;Ambulated in hall  Level of Assistance Modified independent, requires aide device or extra time  Assistive Device Other (Comment) (pushed IV pole)  Distance Ambulated (ft) 190 ft  Mobility Response Tolerated well  Mobility performed by Mobility specialist  $Mobility charge 1 Mobility    Pt laying in bed upon arrival. Pt agreed to session. Pt independent w/ bed mobility and S2S. Pt ambulated ~190' total in room and hallway mod. Independently. CGA utilized for safety. No LOB noted. No c/o pain or SOB. Pt had a steady gait t/o session, required extra time for safety d/t wearing R surgical shoe. Overall, pt tolerated session well. Pt states he "feels good" after session. Pt left laying in bed w/ all needs placed in reach.    Letha Mirabal Mobility Specialist  08/19/20, 11:33 AM

## 2020-08-20 ENCOUNTER — Encounter: Payer: Self-pay | Admitting: Podiatry

## 2020-08-20 DIAGNOSIS — I5043 Acute on chronic combined systolic (congestive) and diastolic (congestive) heart failure: Secondary | ICD-10-CM

## 2020-08-20 DIAGNOSIS — E118 Type 2 diabetes mellitus with unspecified complications: Secondary | ICD-10-CM

## 2020-08-20 DIAGNOSIS — I10 Essential (primary) hypertension: Secondary | ICD-10-CM

## 2020-08-20 DIAGNOSIS — M86171 Other acute osteomyelitis, right ankle and foot: Secondary | ICD-10-CM

## 2020-08-20 LAB — CBC
HCT: 31.2 % — ABNORMAL LOW (ref 39.0–52.0)
Hemoglobin: 10.8 g/dL — ABNORMAL LOW (ref 13.0–17.0)
MCH: 32 pg (ref 26.0–34.0)
MCHC: 34.6 g/dL (ref 30.0–36.0)
MCV: 92.6 fL (ref 80.0–100.0)
Platelets: 146 10*3/uL — ABNORMAL LOW (ref 150–400)
RBC: 3.37 MIL/uL — ABNORMAL LOW (ref 4.22–5.81)
RDW: 13.2 % (ref 11.5–15.5)
WBC: 8.2 10*3/uL (ref 4.0–10.5)
nRBC: 0 % (ref 0.0–0.2)

## 2020-08-20 LAB — GLUCOSE, CAPILLARY
Glucose-Capillary: 125 mg/dL — ABNORMAL HIGH (ref 70–99)
Glucose-Capillary: 166 mg/dL — ABNORMAL HIGH (ref 70–99)
Glucose-Capillary: 214 mg/dL — ABNORMAL HIGH (ref 70–99)
Glucose-Capillary: 125 mg/dL — ABNORMAL HIGH (ref 70–99)

## 2020-08-20 LAB — BASIC METABOLIC PANEL
Anion gap: 8 (ref 5–15)
BUN: 68 mg/dL — ABNORMAL HIGH (ref 6–20)
CO2: 23 mmol/L (ref 22–32)
Calcium: 8.2 mg/dL — ABNORMAL LOW (ref 8.9–10.3)
Chloride: 109 mmol/L (ref 98–111)
Creatinine, Ser: 3.42 mg/dL — ABNORMAL HIGH (ref 0.61–1.24)
GFR calc Af Amer: 22 mL/min — ABNORMAL LOW (ref >60)
GFR calc non Af Amer: 19 mL/min — ABNORMAL LOW (ref >60)
Glucose, Bld: 131 mg/dL — ABNORMAL HIGH (ref 70–99)
Potassium: 4.8 mmol/L (ref 3.5–5.1)
Sodium: 140 mmol/L (ref 135–145)

## 2020-08-20 MED ORDER — SODIUM CHLORIDE 0.9 % IV SOLN
1.0000 g | Freq: Two times a day (BID) | INTRAVENOUS | Status: DC
  Administered 2020-08-20 – 2020-08-21 (×2): 1 g via INTRAVENOUS
  Filled 2020-08-20 (×4): qty 1

## 2020-08-20 MED ORDER — SODIUM CHLORIDE 0.9 % IV SOLN
2.0000 g | INTRAVENOUS | Status: DC
  Filled 2020-08-20: qty 2

## 2020-08-20 MED ORDER — SODIUM CHLORIDE 0.9 % IV SOLN
2.0000 g | Freq: Two times a day (BID) | INTRAVENOUS | Status: DC
  Administered 2020-08-20: 2 g via INTRAVENOUS
  Filled 2020-08-20 (×3): qty 2

## 2020-08-20 MED ORDER — LINEZOLID 600 MG PO TABS
600.0000 mg | ORAL_TABLET | Freq: Two times a day (BID) | ORAL | Status: DC
  Administered 2020-08-20 – 2020-08-21 (×2): 600 mg via ORAL
  Filled 2020-08-20 (×4): qty 1

## 2020-08-20 MED ORDER — ENOXAPARIN SODIUM 40 MG/0.4ML ~~LOC~~ SOLN
40.0000 mg | SUBCUTANEOUS | Status: DC
  Administered 2020-08-20: 40 mg via SUBCUTANEOUS
  Filled 2020-08-20: qty 0.4

## 2020-08-20 NOTE — Progress Notes (Addendum)
[facility]  ROUNDING NOTE   Subjective:   [name] [name] is 59 years old gentleman, with past medical history of CAD, hypertension, ICM with EF 30 to 35%, CKD stage III, came in to the hospital for IV antibiotics and possible surgical debridement of his right metatarsal phalangeal joint which has osteomyelitis.  Patient is consulted to nephrology for AKI. Right partial fifth ray amputation done yesterday.  Patient reports minimal pain, controlled with as needed medications.  He denies worsening shortness of breath, nausea or vomiting.  He still has IV fluid on flow.  Documented urine output for the preceding 24 hours is 800 mL.   Objective:  Vital signs in last 24 hours:  Temp:  [96.6 F (35.9 C)-97.7 F (36.5 C)] 97.7 F (36.5 C) (10/05 1104) Pulse Rate:  [73-79] 74 (10/05 1104) Resp:  [11-18] 14 (10/05 1104) BP: (136-181)/(70-86) 136/81 (10/05 1104) SpO2:  [96 %-100 %] 98 % (10/05 1104)  Weight change:  Filed Weights   08/16/20 1939  Weight: 122.5 kg    Intake/Output: I/O last 3 completed shifts: In: 1882.5 [P.O.:480; I.V.:992.5; IV Piggyback:410] Out: 800 [Urine:800]   Intake/Output this shift:  Total I/O In: 120 [P.O.:120] Out: 700 [Urine:700]  Physical Exam: General:  Comfortable lying in bed, in no distress  Head: Normocephalic, atraumatic. Moist oral mucosal membranes  Eyes: Anicteric  Neck: Supple, trachea at the midline  Lungs:   Lungs clear bilaterally, normal and symmetrical respiratory effort  Heart:  Regular rate and rhythm  Abdomen:  Soft, nontender, nondistended  Extremities:  No peripheral edema, right foot with dressing  Neurologic: Oriented x 3  Skin:  No acute lesions or rashes noted    Basic Metabolic Panel: Recent Labs  Lab 08/16/20 1945 08/16/20 1945 08/18/20 0521 08/19/20 0552 08/20/20 0453  NA 138  --  140 138 140  K 5.0  --  4.2 4.7 4.8  CL 102  --  111 105 109  CO2 23  --  21* 22 23  GLUCOSE 194*  --  88 141* 131*  BUN  83*  --  71* 81* 68*  CREATININE 3.63*  --  3.55* 4.21* 3.42*  CALCIUM 8.7*   < > 7.8* 8.8* 8.2*   < > = values in this interval not displayed.    Liver Function Tests: Recent Labs  Lab 08/16/20 1945  AST 21  ALT 17  ALKPHOS 64  BILITOT 0.8  PROT 7.9  ALBUMIN 3.8   No results for input(s): LIPASE, AMYLASE in the last 168 hours. No results for input(s): AMMONIA in the last 168 hours.  CBC: Recent Labs  Lab 08/16/20 1945 08/18/20 0521 08/20/20 0453  WBC 10.8* 6.8 8.2  NEUTROABS 7.8*  --   --   HGB 11.9* 10.5* 10.8*  HCT 36.2* 31.8* 31.2*  MCV 96.3 95.2 92.6  PLT 194 150 146*    Cardiac Enzymes: No results for input(s): CKTOTAL, CKMB, CKMBINDEX, TROPONINI in the last 168 hours.  BNP: Invalid input(s): POCBNP  CBG: Recent Labs  Lab 08/19/20 1554 08/19/20 1750 08/19/20 2123 08/20/20 0724 08/20/20 1105  GLUCAP 157* 117* 196* 125* 166*    Microbiology: Results for orders placed or performed during the hospital encounter of 08/17/20  Culture, blood (routine x 2)     Status: None (Preliminary result)   Collection Time: 08/17/20 12:41 AM   Specimen: Right Antecubital; Blood  Result Value Ref Range Status   Specimen Description RIGHT ANTECUBITAL  Final   Special Requests  Final    BOTTLES DRAWN AEROBIC AND ANAEROBIC Blood Culture adequate volume   Culture   Final    NO GROWTH 3 DAYS Performed at [facility], [address] [place], [place] [address]    Report Status PENDING  Incomplete  Culture, blood (routine x 2)     Status: None (Preliminary result)   Collection Time: 08/17/20 12:41 AM   Specimen: BLOOD LEFT HAND  Result Value Ref Range Status   Specimen Description BLOOD LEFT HAND  Final   Special Requests   Final    BOTTLES DRAWN AEROBIC AND ANAEROBIC Blood Culture results may not be optimal due to an excessive volume of blood received in culture bottles   Culture   Final    NO GROWTH 3 DAYS Performed at [facility] [address], [place], [place] [address]    Report Status PENDING  Incomplete  Respiratory Panel by RT PCR (Flu A&B, Covid) - Nasopharyngeal Swab     Status: None   Collection Time: 08/17/20  3:39 AM   Specimen: Nasopharyngeal Swab  Result Value Ref Range Status   SARS Coronavirus 2 by RT PCR NEGATIVE NEGATIVE Final    Comment: (NOTE) SARS-CoV-2 target nucleic acids are NOT DETECTED.  The SARS-CoV-2 RNA is generally detectable in upper respiratoy specimens during the acute phase of infection. The lowest concentration of SARS-CoV-2 viral copies this assay can detect is 131 copies/mL. A negative result does not preclude SARS-Cov-2 infection and should not be used as the sole basis for treatment or other patient management decisions. A negative result may occur with  improper specimen collection/handling, submission of specimen other than nasopharyngeal swab, presence of viral mutation(s) within the areas targeted by this assay, and inadequate number of viral copies (<131 copies/mL). A negative result must be combined with clinical observations, patient history, and epidemiological information. The expected result is Negative.  Fact Sheet for Patients:  PinkCheek.be  Fact Sheet for Healthcare Providers:  GravelBags.it  This test is no t yet approved or cleared by the [place] FDA and  has been authorized for detection and/or diagnosis of SARS-CoV-2 by FDA under an Emergency Use Authorization (EUA). This EUA will remain  in effect (meaning this test can be used) for the duration of the COVID-19 declaration under Section 564(b)(1) of the Act, 21 U.S.C. section 360bbb-3(b)(1), unless the authorization is terminated or revoked sooner.     Influenza A by PCR NEGATIVE NEGATIVE Final   Influenza B by PCR NEGATIVE NEGATIVE Final    Comment: (NOTE) The Xpert Xpress SARS-CoV-2/FLU/RSV assay is intended as an aid in  the  diagnosis of influenza from Nasopharyngeal swab specimens and  should not be used as a sole basis for treatment. Nasal washings and  aspirates are unacceptable for Xpert Xpress SARS-CoV-2/FLU/RSV  testing.  Fact Sheet for Patients: PinkCheek.be  Fact Sheet for Healthcare Providers: GravelBags.it  This test is not yet approved or cleared by the [place] FDA and  has been authorized for detection and/or diagnosis of SARS-CoV-2 by  FDA under an Emergency Use Authorization (EUA). This EUA will remain  in effect (meaning this test can be used) for the duration of the  Covid-19 declaration under Section 564(b)(1) of the Act, 21  U.S.C. section 360bbb-3(b)(1), unless the authorization is  terminated or revoked. Performed at [facility], [address] [place], [place] [address]   Aerobic/Anaerobic Culture (surgical/deep wound)     Status: None (Preliminary result)   Collection Time:  08/18/20  9:38 AM   Specimen: Wound  Result Value Ref Range Status   Specimen Description   Final    WOUND Performed at [facility], [address] [place], [place] [address]    Special Requests   Final    NONE Performed at [facility], [address] [place], [place] [address]    Gram Stain   Final    RARE WBC PRESENT,BOTH PMN AND MONONUCLEAR NO ORGANISMS SEEN Performed at [facility], [place] [address] [place], [place] [address]    Culture   Final    NORMAL SKIN FLORA NO ANAEROBES ISOLATED; CULTURE IN PROGRESS FOR 5 DAYS    Report Status PENDING  Incomplete  MRSA PCR Screening     Status: None   Collection Time: 08/19/20  6:15 AM   Specimen: Nasal Mucosa; Nasopharyngeal  Result Value Ref Range Status   MRSA by PCR NEGATIVE NEGATIVE Final    Comment:        The GeneXpert MRSA Assay (FDA approved for NASAL specimens only), is one component of a comprehensive MRSA colonization surveillance  program. It is not intended to diagnose MRSA infection nor to guide or monitor treatment for MRSA infections. Performed at [facility], [address] [place], [place] [address]   Aerobic/Anaerobic Culture (surgical/deep wound)     Status: None (Preliminary result)   Collection Time: 08/19/20  5:18 PM   Specimen: PATH Benign ortho; Tissue  Result Value Ref Range Status   Specimen Description   Final    BONE RIGHT 5TH BONE CULTURE Performed at [facility], [address], [place], [place] [address]    Special Requests   Final    NONE Performed at [facility], [address] [place], [place] [address]    Gram Stain   Final    FEW WBC PRESENT,BOTH PMN AND MONONUCLEAR RARE GRAM POSITIVE COCCI    Culture   Final    NO GROWTH < 12 HOURS Performed at [facility], [place] [address], [place], [place] [address]    Report Status PENDING  Incomplete    Coagulation Studies: No results for input(s): LABPROT, INR in the last 72 hours.  Urinalysis: No results for input(s): COLORURINE, LABSPEC, PHURINE, GLUCOSEU, HGBUR, BILIRUBINUR, KETONESUR, PROTEINUR, UROBILINOGEN, NITRITE, LEUKOCYTESUR in the last 72 hours.  Invalid input(s): APPERANCEUR    Imaging: DG Foot Complete Right  Result Date: 08/19/2020 CLINICAL DATA:  Postop. EXAM: RIGHT FOOT COMPLETE - 3+ VIEW COMPARISON:  None. FINDINGS: Transmetatarsal amputation of the fifth ray. Resection margin is smooth. Expected postsurgical change in the adjacent soft tissues with air and edema. Overlying dressing in place. There are hammertoe deformity of the digits and mild degenerative change of the first metatarsal phalangeal joint. IMPRESSION: Transmetatarsal amputation of the fifth ray. No immediate postoperative complication. Electronically Signed   By: Keith Rake M.D.   On: 08/19/2020 19:21     Medications:    sodium chloride 0 mL/hr at 08/19/20 0015   sodium chloride 50 mL/hr at 08/20/20  0444   ceFEPime (MAXIPIME) IV 2 g (08/20/20 0834)   vancomycin      allopurinol  100 mg Oral Daily   amLODipine  2.5 mg Oral Daily   atorvastatin  40 mg Oral QHS   carvedilol  3.125 mg Oral BID WC   ferrous sulfate  325 mg Oral Daily   insulin aspart  0-20 Units Subcutaneous TID WC   isosorbide mononitrate  60 mg Oral Daily   multivitamin with minerals  1 tablet Oral Daily   sodium bicarbonate  1,300 mg Oral BID   vitamin B-12  1,000 mcg Oral QHS   sodium chloride, acetaminophen **OR** acetaminophen, HYDROcodone-acetaminophen, magnesium hydroxide, morphine injection, nitroGLYCERIN, ondansetron **OR** ondansetron (ZOFRAN) IV, traZODone, zolpidem  Assessment/ Plan:  [name] [name] is a 59 y.o.  male old gentleman, with past medical history of CAD, hypertension, ICM with EF 30 to 35%, CKD stage III, came in to the hospital for IV antibiotics and possible surgical debridement of his right metatarsal phalangeal joint which has osteomyelitis.  Patient is consulted to nephrology for AKI.  # Acute kidney injury with CKD stage IV  Lab Results  Component Value Date   CREATININE 3.42 (H) 08/20/2020   CREATININE 4.21 (H) 08/19/2020   CREATININE 3.55 (H) 08/18/2020   Baseline creatinine 3.25 with GFR 20 on 08/18/19 Creatinine trending down to 3.42 today Continue hydration with IV fluids   We will continue monitoring closely   #DM type II with CKD.  HbA1c 8.7 on 08/16/2020 Blood sugar readings within acceptable range with current antihyperglycemic regimen  #Osteomyelitis of the right fifth metatarsal phalangeal joint Partial amputation done yesterday On antibiotics cefepime and vancomycin Wound culture pending Podiatric team managing   #Hypertension  Blood pressure readings within acceptable range today Continue on current antihypertensive regimen of Coreg and amlodipine     LOS: 3 Princy Raju 10/5/202112:30 PM

## 2020-08-20 NOTE — Progress Notes (Signed)
Daily Progress Note   Subjective  - 1 Day Post-Op  F/u right 5th ray amputation  Objective Vitals:   08/20/20 0442 08/20/20 0832 08/20/20 1104 08/20/20 1619  BP: 138/72 (!) 155/70 136/81 (!) 142/72  Pulse: 73 77 74 75  Resp:  18 14 18   Temp: (!) 97.4 F (36.3 C) (!) 97.4 F (36.3 C) 97.7 F (36.5 C) 98 F (36.7 C)  TempSrc: Oral Oral Oral Oral  SpO2: 98% 98% 98% 99%  Weight:      Height:        Physical Exam: Wound completely coapted.  Healed nicely.  No erythema.    Laboratory CBC    Component Value Date/Time   WBC 8.2 08/20/2020 0453   HGB 10.8 (L) 08/20/2020 0453   HGB 6.6 (L) 08/28/2014 0507   HCT 31.2 (L) 08/20/2020 0453   HCT 20.3 (L) 08/28/2014 0507   PLT 146 (L) 08/20/2020 0453   PLT 225 08/28/2014 0507    BMET    Component Value Date/Time   NA 140 08/20/2020 0453   NA 136 08/28/2014 0507   K 4.8 08/20/2020 0453   K 4.1 08/28/2014 0507   CL 109 08/20/2020 0453   CL 106 08/28/2014 0507   CO2 23 08/20/2020 0453   CO2 22 08/28/2014 0507   GLUCOSE 131 (H) 08/20/2020 0453   GLUCOSE 145 (H) 08/28/2014 0507   BUN 68 (H) 08/20/2020 0453   BUN 25 (H) 08/28/2014 0507   CREATININE 3.42 (H) 08/20/2020 0453   CREATININE 1.57 (H) 08/28/2014 0507   CALCIUM 8.2 (L) 08/20/2020 0453   CALCIUM 7.6 (L) 08/28/2014 0507   GFRNONAA 19 (L) 08/20/2020 0453   GFRNONAA 50 (L) 08/28/2014 0507   GFRNONAA 35 (L) 09/19/2013 1338   GFRAA 22 (L) 08/20/2020 0453   GFRAA >60 08/28/2014 0507   GFRAA 41 (L) 09/19/2013 1338    Assessment/Planning: S/p 5th ray amputation for osteo   Dressing changed. No need for further dressings.  Heel WB only for now.  Wedge shoe ordered.  OK from podiatry standpoint for d/c.  F/u with Dr. Luana Shu in 1 week.  ABx per ID.   Samara Deist A  08/20/2020, 5:59 PM

## 2020-08-20 NOTE — Progress Notes (Signed)
PROGRESS NOTE    [name] [name]  QPY:[id number] DOB: 09-21-1961 DOA: 08/17/2020 PCP: Tracie Harrier, MD    Brief Narrative:  [name] Mestre Murphyis a 59 y.o.malewith medical history significant ofCAD s/p CABG, chronic systolic HF, CKD III, HTN, ischemic cardiomyopathy w/ EF 30-35% Sept '15sent in by his podiatrist for osteomyelitis.  Admitted to the hospital service.  Podiatry was consulted.  X-ray consistent with acute osteomyelitis of the right metatarsophalangeal joint.  Had worsening erythema and warmth over the last few days.  He was admitted by podiatry for IV antibiotics and surgical debridement. He is status post Right partial fifth ray amputation by Dr. Luana Shu on 10/4   Consultants:   Podiatry, infectious disease, nephrology  Procedures: Status post right partial fifth ray amputation by Dr. Luana Shu on 10/4  Antimicrobials:   Cefepime and vancomycin   Subjective: Patient has no complaints today.  Pain controlled.  No shortness of breath, dizziness or abdominal pain  Objective: Vitals:   08/19/20 2031 08/20/20 0022 08/20/20 0442 08/20/20 0832  BP: (!) 156/81 (!) 154/70 138/72 (!) 155/70  Pulse: 76 79 73 77  Resp: 18 18  18   Temp: (!) 96.6 F (35.9 C) (!) 97.5 F (36.4 C) (!) 97.4 F (36.3 C) (!) 97.4 F (36.3 C)  TempSrc: Axillary Oral Oral Oral  SpO2: 99% 99% 98% 98%  Weight:      Height:        Intake/Output Summary (Last 24 hours) at 08/20/2020 0848 Last data filed at 08/20/2020 0710 Gross per 24 hour  Intake 1459.54 ml  Output 1500 ml  Net -40.46 ml   Filed Weights   08/16/20 1939  Weight: 122.5 kg    Examination:  General exam: Appears calm and comfortable  Respiratory system: Clear to auscultation. Respiratory effort normal. Cardiovascular system: S1 & S2 heard, RRR. No JVD, murmurs, rubs, gallops or clicks. Gastrointestinal system: Abdomen is nondistended, soft and nontender.  Normal bowel sounds heard. Central nervous system: Alert and  oriented. No focal neurological deficits. Extremities: Trace bilateral edema, place did not open Skin: Warm and dry Psychiatry: Judgement and insight appear normal. Mood & affect appropriate.     Data Reviewed: I have personally reviewed following labs and imaging studies  CBC: Recent Labs  Lab 08/16/20 1945 08/18/20 0521 08/20/20 0453  WBC 10.8* 6.8 8.2  NEUTROABS 7.8*  --   --   HGB 11.9* 10.5* 10.8*  HCT 36.2* 31.8* 31.2*  MCV 96.3 95.2 92.6  PLT 194 150 124*   Basic Metabolic Panel: Recent Labs  Lab 08/16/20 1945 08/18/20 0521 08/19/20 0552 08/20/20 0453  NA 138 140 138 140  K 5.0 4.2 4.7 4.8  CL 102 111 105 109  CO2 23 21* 22 23  GLUCOSE 194* 88 141* 131*  BUN 83* 71* 81* 68*  CREATININE 3.63* 3.55* 4.21* 3.42*  CALCIUM 8.7* 7.8* 8.8* 8.2*   GFR: Estimated Creatinine Clearance: 32.7 mL/min (A) (by C-G formula based on SCr of 3.42 mg/dL (H)). Liver Function Tests: Recent Labs  Lab 08/16/20 1945  AST 21  ALT 17  ALKPHOS 64  BILITOT 0.8  PROT 7.9  ALBUMIN 3.8   No results for input(s): LIPASE, AMYLASE in the last 168 hours. No results for input(s): AMMONIA in the last 168 hours. Coagulation Profile: No results for input(s): INR, PROTIME in the last 168 hours. Cardiac Enzymes: No results for input(s): CKTOTAL, CKMB, CKMBINDEX, TROPONINI in the last 168 hours. BNP (last 3 results) No results for input(s): PROBNP  in the last 8760 hours. HbA1C: No results for input(s): HGBA1C in the last 72 hours. CBG: Recent Labs  Lab 08/19/20 1146 08/19/20 1554 08/19/20 1750 08/19/20 2123 08/20/20 0724  GLUCAP 159* 157* 117* 196* 125*   Lipid Profile: No results for input(s): CHOL, HDL, LDLCALC, TRIG, CHOLHDL, LDLDIRECT in the last 72 hours. Thyroid Function Tests: No results for input(s): TSH, T4TOTAL, FREET4, T3FREE, THYROIDAB in the last 72 hours. Anemia Panel: No results for input(s): VITAMINB12, FOLATE, FERRITIN, TIBC, IRON, RETICCTPCT in the last 72  hours. Sepsis Labs: Recent Labs  Lab 08/17/20 0041  LATICACIDVEN 1.7    Recent Results (from the past 240 hour(s))  Culture, blood (routine x 2)     Status: None (Preliminary result)   Collection Time: 08/17/20 12:41 AM   Specimen: Right Antecubital; Blood  Result Value Ref Range Status   Specimen Description RIGHT ANTECUBITAL  Final   Special Requests   Final    BOTTLES DRAWN AEROBIC AND ANAEROBIC Blood Culture adequate volume   Culture   Final    NO GROWTH 3 DAYS Performed at [facility], [address] [place], [place] [address]    Report Status PENDING  Incomplete  Culture, blood (routine x 2)     Status: None (Preliminary result)   Collection Time: 08/17/20 12:41 AM   Specimen: BLOOD LEFT HAND  Result Value Ref Range Status   Specimen Description BLOOD LEFT HAND  Final   Special Requests   Final    BOTTLES DRAWN AEROBIC AND ANAEROBIC Blood Culture results may not be optimal due to an excessive volume of blood received in culture bottles   Culture   Final    NO GROWTH 3 DAYS Performed at [facility], [address], [place], [place] [address]    Report Status PENDING  Incomplete  Respiratory Panel by RT PCR (Flu A&B, Covid) - Nasopharyngeal Swab     Status: None   Collection Time: 08/17/20  3:39 AM   Specimen: Nasopharyngeal Swab  Result Value Ref Range Status   SARS Coronavirus 2 by RT PCR NEGATIVE NEGATIVE Final    Comment: (NOTE) SARS-CoV-2 target nucleic acids are NOT DETECTED.  The SARS-CoV-2 RNA is generally detectable in upper respiratoy specimens during the acute phase of infection. The lowest concentration of SARS-CoV-2 viral copies this assay can detect is 131 copies/mL. A negative result does not preclude SARS-Cov-2 infection and should not be used as the sole basis for treatment or other patient management decisions. A negative result may occur with  improper specimen collection/handling, submission of specimen other than  nasopharyngeal swab, presence of viral mutation(s) within the areas targeted by this assay, and inadequate number of viral copies (<131 copies/mL). A negative result must be combined with clinical observations, patient history, and epidemiological information. The expected result is Negative.  Fact Sheet for Patients:  PinkCheek.be  Fact Sheet for Healthcare Providers:  GravelBags.it  This test is no t yet approved or cleared by the [place] FDA and  has been authorized for detection and/or diagnosis of SARS-CoV-2 by FDA under an Emergency Use Authorization (EUA). This EUA will remain  in effect (meaning this test can be used) for the duration of the COVID-19 declaration under Section 564(b)(1) of the Act, 21 U.S.C. section 360bbb-3(b)(1), unless the authorization is terminated or revoked sooner.     Influenza A by PCR NEGATIVE NEGATIVE Final   Influenza B by PCR NEGATIVE NEGATIVE Final    Comment: (NOTE) The Xpert Xpress  SARS-CoV-2/FLU/RSV assay is intended as an aid in  the diagnosis of influenza from Nasopharyngeal swab specimens and  should not be used as a sole basis for treatment. Nasal washings and  aspirates are unacceptable for Xpert Xpress SARS-CoV-2/FLU/RSV  testing.  Fact Sheet for Patients: PinkCheek.be  Fact Sheet for Healthcare Providers: GravelBags.it  This test is not yet approved or cleared by the [place] FDA and  has been authorized for detection and/or diagnosis of SARS-CoV-2 by  FDA under an Emergency Use Authorization (EUA). This EUA will remain  in effect (meaning this test can be used) for the duration of the  Covid-19 declaration under Section 564(b)(1) of the Act, 21  U.S.C. section 360bbb-3(b)(1), unless the authorization is  terminated or revoked. Performed at [facility], [address] [place], [place]  [address]   Aerobic/Anaerobic Culture (surgical/deep wound)     Status: None (Preliminary result)   Collection Time: 08/18/20  9:38 AM   Specimen: Wound  Result Value Ref Range Status   Specimen Description   Final    WOUND Performed at [facility], [address], [place], [place] [address]    Special Requests   Final    NONE Performed at [facility], [address] [place], [place] [address]    Gram Stain   Final    RARE WBC PRESENT,BOTH PMN AND MONONUCLEAR NO ORGANISMS SEEN    Culture   Final    NORMAL SKIN FLORA Performed at [facility], [place] [address] [place], [place] [address]    Report Status PENDING  Incomplete  MRSA PCR Screening     Status: None   Collection Time: 08/19/20  6:15 AM   Specimen: Nasal Mucosa; Nasopharyngeal  Result Value Ref Range Status   MRSA by PCR NEGATIVE NEGATIVE Final    Comment:        The GeneXpert MRSA Assay (FDA approved for NASAL specimens only), is one component of a comprehensive MRSA colonization surveillance program. It is not intended to diagnose MRSA infection nor to guide or monitor treatment for MRSA infections. Performed at [facility], [address] [place], [place] [address]   Aerobic/Anaerobic Culture (surgical/deep wound)     Status: None (Preliminary result)   Collection Time: 08/19/20  5:18 PM   Specimen: PATH Benign ortho; Tissue  Result Value Ref Range Status   Specimen Description   Final    BONE RIGHT 5TH BONE CULTURE Performed at [facility], [address], [place], [place] [address]    Special Requests   Final    NONE Performed at [facility], [address] [place], [place] [address]    Gram Stain   Final    FEW WBC PRESENT,BOTH PMN AND MONONUCLEAR RARE GRAM POSITIVE COCCI Performed at [facility], [place] [address] [place],  [address]    Culture PENDING  Incomplete   Report Status PENDING  Incomplete         Radiology  Studies: DG Foot Complete Right  Result Date: 08/19/2020 CLINICAL DATA:  Postop. EXAM: RIGHT FOOT COMPLETE - 3+ VIEW COMPARISON:  None. FINDINGS: Transmetatarsal amputation of the fifth ray. Resection margin is smooth. Expected postsurgical change in the adjacent soft tissues with air and edema. Overlying dressing in place. There are hammertoe deformity of the digits and mild degenerative change of the first metatarsal phalangeal joint. IMPRESSION: Transmetatarsal amputation of the fifth ray. No immediate postoperative complication. Electronically Signed   By:  Keith Rake M.D.   On: 08/19/2020 19:21        Scheduled Meds: . allopurinol  100 mg Oral Daily  . amLODipine  2.5 mg Oral Daily  . atorvastatin  40 mg Oral QHS  . carvedilol  3.125 mg Oral BID WC  . ferrous sulfate  325 mg Oral Daily  . insulin aspart  0-20 Units Subcutaneous TID WC  . isosorbide mononitrate  60 mg Oral Daily  . multivitamin with minerals  1 tablet Oral Daily  . sodium bicarbonate  1,300 mg Oral BID  . vitamin B-12  1,000 mcg Oral QHS   Continuous Infusions: . sodium chloride 0 mL/hr at 08/19/20 0015  . sodium chloride 50 mL/hr at 08/20/20 0444  . ceFEPime (MAXIPIME) IV 2 g (08/20/20 0834)  . vancomycin      Assessment & Plan:   Active Problems:   Diabetes mellitus type 2 with complications (HCC)   Hypercholesterolemia   Chronic systolic heart failure (HCC)   HTN (hypertension)   Acute osteomyelitis of right foot ([place])   1. Osteomyelitis right 5 th metatarsal -present on admission Per podiatry wound probe to bone, x-ray from clinic revealed evidence of OM at fifth MTP J with associated ulceration, no gas in tissue Arterial Dopplers within normal limits, no vascular consultation needed at this time  Wound culture pending Dressing change daily until surgery 10/5- minimal pain.  Awaiting further input from podiatry Will continue on vancomycin and cefepime Partial weightbearing in surgical shoe  with heel contact order placed by podiatry.  Patient waiting for the boot. Per Dr. Luana Shu via chat, ok to start dvt ppx with lovenox, monitor cbc ID was consulted and input pending   2. Chronic systolic heart failure - no evidence of decompensation.  Last Echo 9/20  w/ EF ejection fraction of 30-35%  10/5-currently euvolemic.  Will DC IV fluids Monitor I's and O's, volume status.  Continue Imdur, amlodipine, carvedilol Torsemide was held yesterday for surgery as patient was on the dry side.   3. DM -  A1c 8.7 Blood glucose levels stable here  Continue R ISS, hypoglycemic protocol  With good p.o. intake and sugar start increasing    4. HLD -on statin  5. HTN- stable , continue on beta-blockers, amlodipine, Imdur    6. Acute on CKD IV - creatinine was mildly elevated.  At 4.21 Torsemide was held 4 OR yesterday Received gentle hydration with IV fluids Creatinine close to baseline at 3.42 Nephrology following Encourage p.o. intake Continue to hold diuretic Avoid nephrotoxic agents   DVT prophylaxis: Lovenox Code Status: Full Family Communication: None at bedside  Status is: Inpatient  Remains inpatient appropriate because:IV treatments appropriate due to intensity of illness or inability to take PO   Dispo: The patient is from: Home              Anticipated d/c is to: Home              Anticipated d/c date is: 1 day              Patient currently is not medically stable to d/c.on iv abx, not cleared by podiatry. ID consulted and pending.             LOS: 3 days   Time spent: 35 min with >50% on coc    Nolberto Hanlon, MD [facility] Pager [phone]  If 7PM-7AM, please contact night-coverage [email or web address] Password [facility] 08/20/2020, 8:48 AM

## 2020-08-20 NOTE — Evaluation (Signed)
Physical Therapy Evaluation Patient Details Name: [name] [name] MRN: [id number] DOB: [birth year] Today's Date: 08/20/2020   History of Present Illness  [name] [name] is a 59 y/o male who was send from podiatrist for worsening edema and warmth x few days from wound that pt has been dealing with for a year. Pt underwent R partial 5th ray amputation on 10/4. PMH includes: CAD, s/p CABG, chronic systolic heart failure, CKD III, HTN, ischemic cardiomyopathy w/ EF 30-35% Sept 2015, neuropathy, orthostatic hypotension, and PVD.  Clinical Impression  Pt lying in bed upon arrival to room and agreeable to participating in PT evaluation. Pt performed bed mobility with mod I utilizing momentum to bring trunk into upright sitting.Pt then able to swivel hips to sitting edge of bed. Pt able to don both tennis and surgical shoe while sitting edge of bed and with noted good sitting balance. Increased height of bed slightly for pt to perform sit to stand transfer to RW. Min A for boosting to stand. Verbal cues for pt to place RLE anterior to body in order to assure PWB through heel only. Once in standing, pt required CGA for steadying before ambulating 15 feet within room with step to pattern due to anterior RLE placement. Verbal cues to utilize BUE on RW to offset weight in order to maintain PWB. Pt returned to recliner chair and required min A for eccentric control on descent with verbal cues to reach back to armrests to assist with controlled lowering. Pt then performed therex seated in recliner chair. Pt presents with deficits in strength, balance, functional mobility, and functional activity tolerance. Pt would benefit from skilled PT during acute stay to address current deficits and HHPT at discharge to optimize return to PLOF and maximize functional mobility, safety, and independence.    Follow Up Recommendations Home health PT    Equipment Recommendations  None recommended by PT;Other (comment) (pt reports  having RW and knee scooter)    Recommendations for Other Services       Precautions / Restrictions Precautions Precautions: Fall Required Braces or Orthoses: Other Brace (surgical shoe) Restrictions Weight Bearing Restrictions: Yes RLE Weight Bearing: Partial weight bearing Other Position/Activity Restrictions: weightbearing through heel only      Mobility  Bed Mobility Overal bed mobility: Modified Independent             General bed mobility comments: throws BLE to gain momentum to elevate trunk and swivel hips to sit edge of bed  Transfers Overall transfer level: Needs assistance Equipment used: Rolling walker (2 wheeled) Transfers: Sit to/from Stand Sit to Stand: Min assist;From elevated surface         General transfer comment: Lifted bed height several inches; pt required min A for sit <> stand transfer for boost into standing, balance, and eccentric control on descent; pt donned shoes bilaterally with sx shoe on R foot with R foot placed anterior to body for PWB through heel only  Ambulation/Gait Ambulation/Gait assistance: Min guard Gait Distance (Feet): 15 Feet Assistive device: Rolling walker (2 wheeled) Gait Pattern/deviations: Decreased weight shift to right;Step-to pattern Gait velocity: decreased   General Gait Details: Pt instructed to place RLE anterior to body for PWB through heel only; step-to pattern observed with heavy BUE usage to maintain PWB on RLE; CGA for steadying and safety; verbal cues for WBing precaution and positioning of RLE to ensure heel WBing only  Science writer  Modified Rankin (Stroke Patients Only)       Balance Overall balance assessment: Needs assistance Sitting-balance support: Feet supported Sitting balance-Leahy Scale: Good Sitting balance - Comments: no overt LOB while sitting EOB; pt able to don both tennis and surgical shoes sitting EOB   Standing balance support: Bilateral upper  extremity supported Standing balance-Leahy Scale: Fair Standing balance comment: pt required CGA for steadying with BUE on RW                             Pertinent Vitals/Pain Pain Assessment: No/denies pain    Home Living Family/patient expects to be discharged to:: Private residence Living Arrangements: Spouse/significant other Available Help at Discharge: Family;Available PRN/intermittently Type of Home: House Home Access: Stairs to enter Entrance Stairs-Rails: Psychiatric nurse of Steps: 4-5 Home Layout: One level Home Equipment: Walker - 2 wheels;Cane - single point;Other (comment) (knee scooter) Additional Comments: Pt reports he will most likely use the knee scooter; states his wife is undergoing radiation so she is in and out of the hospital herself but can assist with him PRN    Prior Function Level of Independence: Independent         Comments: Pt reports independence with ambulation and mod I for transfers (for use of arms) prior to hospitalization; pt reports being very active and was playing golf the week before his hospitalization; pt reports he fell once at the golf course tripping over an uneven surface     Hand Dominance        Extremity/Trunk Assessment   Upper Extremity Assessment Upper Extremity Assessment: Generalized weakness (decreased R shoulder range since CABG)    Lower Extremity Assessment Lower Extremity Assessment: Generalized weakness (grossly 3+ to 4-/5 bilaterally )       Communication   Communication: No difficulties  Cognition Arousal/Alertness: Awake/alert Behavior During Therapy: WFL for tasks assessed/performed Overall Cognitive Status: Within Functional Limits for tasks assessed                                 General Comments: Pt A&O x 4      General Comments      Exercises Other Exercises Other Exercises: in sitting: pt performed LAQ, alternating marches, and SLR x 10  bilaterally   Assessment/Plan    PT Assessment Patient needs continued PT services  PT Problem List Decreased strength;Decreased range of motion;Decreased activity tolerance;Decreased balance;Decreased mobility       PT Treatment Interventions DME instruction;Gait training;Stair training;Functional mobility training;Therapeutic activities;Therapeutic exercise;Balance training;Patient/family education    PT Goals (Current goals can be found in the Care Plan section)  Acute Rehab PT Goals Patient Stated Goal: to go home PT Goal Formulation: With patient Time For Goal Achievement: 09/03/20 Potential to Achieve Goals: Good    Frequency Min 2X/week   Barriers to discharge        Co-evaluation               AM-PAC PT "6 Clicks" Mobility  Outcome Measure Help needed turning from your back to your side while in a flat bed without using bedrails?: None Help needed moving from lying on your back to sitting on the side of a flat bed without using bedrails?: A Little Help needed moving to and from a bed to a chair (including a wheelchair)?: A Little Help needed standing up from a chair using your arms (  e.g., wheelchair or bedside chair)?: A Little Help needed to walk in hospital room?: A Little Help needed climbing 3-5 steps with a railing? : A Lot 6 Click Score: 18    End of Session Equipment Utilized During Treatment: Gait belt Activity Tolerance: Patient limited by fatigue Patient left: in chair;with call bell/phone within reach;with chair alarm set Nurse Communication: Mobility status PT Visit Diagnosis: Unsteadiness on feet (R26.81);Other abnormalities of gait and mobility (R26.89);Muscle weakness (generalized) (M62.81);History of falling (Z91.81)    Time: 1947-1252 PT Time Calculation (min) (ACUTE ONLY): 22 min   Charges:              Vale Haven, SPT  Vale Haven 08/20/2020, 1:43 PM

## 2020-08-20 NOTE — Consult Note (Signed)
NAME: [name] [name]  DOB: 1961-06-22  MRN: [id number]  Date/Time: 08/20/2020 3:53 PM  REQUESTING PROVIDER: Kurtis Bushman Subjective:  REASON FOR CONSULT: osteomyelitis 5th toe - rt foot ? [name] [name] is a 59 y.o. male with a history of diabetes mellitus, coronary artery disease, status post CABG, congestive heart failure cardiomyopathy, ICD, peripheral vascular disease, AIN due to beta-lactam, left TMA for diabetic foot ulcer was admitted on 08/16/20 for rt toe ulcer.pt had a callus on the 5th toe and was being followed by podiatrist with frequent debridement . He last saw them in May 2021 and was lost to follow up. He presented on 10/1 to their office with an ulcer that was present on a few days and noted redness, purulent drainage and odor. Xray taken by podiatrist showed osteomyelitis- he was started on bactrim and next day was sent to ED for admission. admission labs were wbc 10.8, Hb 11.9, PLT 194 , cr 363 He was started on vanco and cefepime and underwent ray excision of 5th toe on 08/19/20. I am asked to see the patient for further management Culture pending, pathology pending. Pt says he is feeling better  Past Medical History:  Diagnosis Date  . CAD (coronary artery disease)    a. 04/2014 CABG x 4 (LIMA->LAD, VG->Diag, VG->OM, VG->PDA).  . Chronic systolic CHF (congestive heart failure) ([place])    a. 07/2014 Echo: EF 30-35%.  . CKD (chronic kidney disease), stage III ([place])    a. 12/2015 Creat 1.8.  . Diabetic foot ulcers ([place])    a. left foot 2nd digit ant 5 th digit 05/03/14; b. 08/2014 s/p amputation of toes on L foot.  . Hypercholesterolemia    a. 12/2015 TC 116, TG 154, HDL 24, LDL 61-->Atorvastatin 40.  Marland Kitchen Hypertension   . Hypertensive heart disease   . Ischemic cardiomyopathy    a. 07/2013 Echo: EF 20%; b. 07/2014 Echo: EF 30-35%, diff HK, Gr1 DD, mildly dil LA, nl PASP.  Marland Kitchen Myocardial infarction ([place])   . Neuropathy in diabetes ([place])   . Open wound    foot  . Orthostatic hypotension    . Peripheral vascular disease ([place])   . Type II diabetes mellitus ([place])    a. 12/2015 HbA1c = 13.9.    Past Surgical History:  Procedure Laterality Date  . ACHILLES TENDON SURGERY Right 01/22/2017   Procedure: ACHILLES LENGTHENING/KIDNER/TAL/Teno achilles lengthening;  Surgeon: Samara Deist, DPM;  Location: [facility];  Service: Podiatry;  Laterality: Right;  . AMPUTATION Right 08/19/2020   Procedure: AMPUTATION RAY-Right Partial 5th Ray;  Surgeon: Caroline More, DPM;  Location: [facility];  Service: Podiatry;  Laterality: Right;  . CARDIAC CATHETERIZATION     03/2014  . CATARACT EXTRACTION    . CORONARY ARTERY BYPASS GRAFT N/A 05/07/2014   Procedure: CORONARY ARTERY BYPASS GRAFTING (CABG);  Surgeon: Gaye Pollack, MD;  Location: [facility];  Service: Open Heart Surgery;  Laterality: N/A;  Times 4 using left internal mammary artery and endoscopically harvested right saphenous vein  . DIALYSIS/PERMA CATHETER INSERTION N/A 08/04/2019   Procedure: DIALYSIS/PERMA CATHETER INSERTION;  Surgeon: Katha Cabal, MD;  Location: [facility];  Service: Cardiovascular;  Laterality: N/A;  . DIALYSIS/PERMA CATHETER REMOVAL N/A 08/08/2019   Procedure: DIALYSIS/PERMA CATHETER REMOVAL;  Surgeon: Katha Cabal, MD;  Location: [facility];  Service: Cardiovascular;  Laterality: N/A;  . ICD IMPLANT N/A 10/04/2019   Procedure: ICD IMPLANT;  Surgeon: Deboraha Sprang, MD;  Location: [facility];  Service: Cardiovascular;  Laterality: N/A;  . INTRAOPERATIVE TRANSESOPHAGEAL ECHOCARDIOGRAM N/A 05/07/2014   Procedure: INTRAOPERATIVE TRANSESOPHAGEAL ECHOCARDIOGRAM;  Surgeon: Gaye Pollack, MD;  Location: [facility];  Service: Open Heart Surgery;  Laterality: N/A;  . left foot osteomyelitis and wound after surgery    . TOE AMPUTATION     Left 3 and 4 toes  . TONSILECTOMY/ADENOIDECTOMY WITH MYRINGOTOMY      Social History   Socioeconomic History  . Marital status: Married    Spouse name: Not on  file  . Number of children: Not on file  . Years of education: Not on file  . Highest education level: Not on file  Occupational History  . Not on file  Tobacco Use  . Smoking status: Never Smoker  . Smokeless tobacco: Never Used  Vaping Use  . Vaping Use: Never used  Substance and Sexual Activity  . Alcohol use: No  . Drug use: No  . Sexual activity: Not on file  Other Topics Concern  . Not on file  Social History Narrative  . Not on file   Social Determinants of Health   Financial Resource Strain:   . Difficulty of Paying Living Expenses: Not on file  Food Insecurity:   . Worried About Charity fundraiser in the Last Year: Not on file  . Ran Out of Food in the Last Year: Not on file  Transportation Needs:   . Lack of Transportation (Medical): Not on file  . Lack of Transportation (Non-Medical): Not on file  Physical Activity:   . Days of Exercise per Week: Not on file  . Minutes of Exercise per Session: Not on file  Stress:   . Feeling of Stress : Not on file  Social Connections:   . Frequency of Communication with Friends and Family: Not on file  . Frequency of Social Gatherings with Friends and Family: Not on file  . Attends Religious Services: Not on file  . Active Member of Clubs or Organizations: Not on file  . Attends Archivist Meetings: Not on file  . Marital Status: Not on file  Intimate Partner Violence:   . Fear of Current or Ex-Partner: Not on file  . Emotionally Abused: Not on file  . Physically Abused: Not on file  . Sexually Abused: Not on file    Family History  Problem Relation Age of Onset  . Heart disease Father        CABG in his 44's  . Diabetes type II Other   . Kidney disease Other   . Hypertension Other   . Ovarian cancer Mother    Allergies  Allergen Reactions  . 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 Doesn't believe he has an allergy to PCN as he has  taken it previously, according to wife.  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 Doesn't believe he has an allergy to PCN as he has taken it previously, according to wife.    ? Current Facility-Administered Medications  Medication Dose Route Frequency Provider Last Rate Last Admin  . 0.9 %  sodium chloride infusion   Intravenous PRN Caroline More, DPM 0 mL/hr at 08/19/20 0015 Restarted at 08/19/20 1746  . acetaminophen (TYLENOL) tablet 650 mg  650 mg Oral Q6H PRN Caroline More, DPM       Or  . acetaminophen (TYLENOL) suppository 650 mg  650  mg Rectal Q6H PRN Caroline More, DPM      . allopurinol (ZYLOPRIM) tablet 100 mg  100 mg Oral Daily Caroline More, DPM   100 mg at 08/20/20 1029  . amLODipine (NORVASC) tablet 2.5 mg  2.5 mg Oral Daily Caroline More, DPM   2.5 mg at 08/20/20 1029  . atorvastatin (LIPITOR) tablet 40 mg  40 mg Oral QHS Caroline More, DPM   40 mg at 08/19/20 2027  . carvedilol (COREG) tablet 3.125 mg  3.125 mg Oral BID WC Caroline More, DPM   3.125 mg at 08/20/20 4332  . ceFEPIme (MAXIPIME) 2 g in sodium chloride 0.9 % 100 mL IVPB  2 g Intravenous Q12H Benita Gutter, RPH 200 mL/hr at 08/20/20 0834 2 g at 08/20/20 0834  . enoxaparin (LOVENOX) injection 40 mg  40 mg Subcutaneous Q24H Nolberto Hanlon, MD      . ferrous sulfate tablet 325 mg  325 mg Oral Daily Caroline More, DPM   325 mg at 08/20/20 1029  . HYDROcodone-acetaminophen (NORCO/VICODIN) 5-325 MG per tablet 1 tablet  1 tablet Oral Q4H PRN Caroline More, DPM   1 tablet at 08/20/20 1537  . insulin aspart (novoLOG) injection 0-20 Units  0-20 Units Subcutaneous TID WC Caroline More, DPM   4 Units at 08/20/20 1250  . isosorbide mononitrate (IMDUR) 24 hr tablet 60 mg  60 mg Oral Daily Caroline More, DPM   60 mg at 08/20/20 1029  . magnesium hydroxide (MILK OF MAGNESIA) suspension 30 mL  30 mL Oral Daily PRN Caroline More, DPM      . morphine 2 MG/ML  injection 2 mg  2 mg Intravenous Q4H PRN Caroline More, DPM      . multivitamin with minerals tablet 1 tablet  1 tablet Oral Daily Caroline More, DPM   1 tablet at 08/20/20 1029  . nitroGLYCERIN (NITROSTAT) SL tablet 0.4 mg  0.4 mg Sublingual Q5 min PRN Caroline More, DPM      . ondansetron [facility]) tablet 4 mg  4 mg Oral Q6H PRN Caroline More, DPM       Or  . ondansetron (ZOFRAN) injection 4 mg  4 mg Intravenous Q6H PRN Caroline More, DPM      . sodium bicarbonate tablet 1,300 mg  1,300 mg Oral BID Caroline More, DPM   1,300 mg at 08/20/20 1028  . traZODone (DESYREL) tablet 25 mg  25 mg Oral QHS PRN Caroline More, DPM      . vancomycin (VANCOREADY) IVPB 1750 mg/350 mL  1,750 mg Intravenous Q48H Baker, Mitzi Hansen, DPM      . vitamin B-12 (CYANOCOBALAMIN) tablet 1,000 mcg  1,000 mcg Oral QHS Caroline More, DPM   1,000 mcg at 08/19/20 2027  . zolpidem (AMBIEN) tablet 5 mg  5 mg Oral QHS PRN Caroline More, DPM         Abtx:  Anti-infectives (From admission, onward)   Start     Dose/Rate Route Frequency Ordered Stop   08/20/20 2158  vancomycin (VANCOREADY) IVPB 1750 mg/350 mL        1,750 mg 175 mL/hr over 120 Minutes Intravenous Every 48 hours 08/19/20 0858     08/20/20 0800  ceFEPIme (MAXIPIME) 2 g in sodium chloride 0.9 % 100 mL IVPB  Status:  Discontinued        2 g 200 mL/hr over 30 Minutes Intravenous Every 24 hours 08/19/20 0845 08/20/20 0740   08/20/20 0800  ceFEPIme (MAXIPIME) 2 g in sodium chloride 0.9 % 100  mL IVPB        2 g 200 mL/hr over 30 Minutes Intravenous Every 12 hours 08/20/20 0740     08/19/20 0600  vancomycin (VANCOREADY) IVPB 1750 mg/350 mL  Status:  Discontinued        1,750 mg 175 mL/hr over 120 Minutes Intravenous Every 48 hours 08/17/20 1216 08/18/20 1718   08/18/20 2200  vancomycin (VANCOREADY) IVPB 1500 mg/300 mL  Status:  Discontinued        1,500 mg 150 mL/hr over 120 Minutes Intravenous Every 24 hours 08/18/20 1718 08/19/20 0858   08/17/20 1800  ceFEPIme  (MAXIPIME) 2 g in sodium chloride 0.9 % 100 mL IVPB  Status:  Discontinued        2 g 200 mL/hr over 30 Minutes Intravenous Every 12 hours 08/17/20 0643 08/19/20 0845   08/17/20 0643  vancomycin variable dose per unstable renal function (pharmacist dosing)  Status:  Discontinued         Does not apply See admin instructions 08/17/20 0643 08/17/20 1216   08/17/20 0630  vancomycin (VANCOCIN) IVPB 1000 mg/200 mL premix        1,000 mg 200 mL/hr over 60 Minutes Intravenous  Once 08/17/20 0608 08/17/20 0749   08/17/20 0600  vancomycin (VANCOCIN) IVPB 1000 mg/200 mL premix  Status:  Discontinued        1,000 mg 200 mL/hr over 60 Minutes Intravenous  Once 08/17/20 0551 08/17/20 0603   08/17/20 0600  ceFEPIme (MAXIPIME) 2 g in sodium chloride 0.9 % 100 mL IVPB        2 g 200 mL/hr over 30 Minutes Intravenous  Once 08/17/20 0551 08/17/20 0644   08/17/20 0315  cefTRIAXone (ROCEPHIN) 1 g in sodium chloride 0.9 % 100 mL IVPB        1 g 200 mL/hr over 30 Minutes Intravenous  Once 08/17/20 0302 08/17/20 0406   08/17/20 0315  vancomycin (VANCOCIN) IVPB 1000 mg/200 mL premix        1,000 mg 200 mL/hr over 60 Minutes Intravenous  Once 08/17/20 0302 08/17/20 0535      REVIEW OF SYSTEMS:  Const: negative fever, negative chills, negative weight loss Eyes: negative diplopia or visual changes, negative eye pain ENT: negative coryza, negative sore throat Resp: negative cough, hemoptysis, dyspnea Cards: negative for chest pain, palpitations, lower extremity edema GU: negative for frequency, dysuria and hematuria GI: Negative for abdominal pain, diarrhea, bleeding, constipation Skin: negative for rash and pruritus Heme: negative for easy bruising and gum/nose bleeding MS: negative for myalgias, arthralgias, back pain and muscle weakness Neurolo:negative for headaches, dizziness, vertigo, memory problems  Psych: negative for feelings of anxiety, depression  Endocrine: poorly controlled  DM Allergy/Immunology- Interstitial nephritis to unasyn Objective:  VITALS:  BP 136/81 (BP Location: Right Arm)   Pulse 74   Temp 97.7 F (36.5 C) (Oral)   Resp 14   Ht 6\' 1"  (1.854 m)   Wt 122.5 kg   SpO2 98%   BMI 35.62 kg/m  PHYSICAL EXAM:  General: Alert, cooperative, no distress, appears stated age.  Head: Normocephalic, without obvious abnormality, atraumatic. Eyes: Conjunctivae clear, anicteric sclerae. Pupils are equal ENT Nares normal. No drainage or sinus tenderness. Lips, mucosa, and tongue normal. No Thrush Neck: Supple, symmetrical, no adenopathy, thyroid: non tender no carotid bruit and no JVD. Back: No CVA tenderness. Lungs: Clear to auscultation bilaterally. No Wheezing or Rhonchi. No rales. Heart: Regular rate and rhythm, no murmur, rub or gallop. ICD site okay Abdomen:  Soft, non-tender,not distended. Bowel sounds normal. No masses Extremities: left TMA Rt foot dressing not removed  Skin: No rashes or lesions. Or bruising Lymph: Cervical, supraclavicular normal. Neurologic: Grossly non-focal Pertinent Labs Lab Results CBC    Component Value Date/Time   WBC 8.2 08/20/2020 0453   RBC 3.37 (L) 08/20/2020 0453   HGB 10.8 (L) 08/20/2020 0453   HGB 6.6 (L) 08/28/2014 0507   HCT 31.2 (L) 08/20/2020 0453   HCT 20.3 (L) 08/28/2014 0507   PLT 146 (L) 08/20/2020 0453   PLT 225 08/28/2014 0507   MCV 92.6 08/20/2020 0453   MCV 89 08/28/2014 0507   MCH 32.0 08/20/2020 0453   MCHC 34.6 08/20/2020 0453   RDW 13.2 08/20/2020 0453   RDW 13.3 08/28/2014 0507   LYMPHSABS 1.8 08/16/2020 1945   LYMPHSABS 1.6 08/28/2014 0507   MONOABS 0.9 08/16/2020 1945   MONOABS 0.9 08/28/2014 0507   EOSABS 0.2 08/16/2020 1945   EOSABS 0.2 08/28/2014 0507   BASOSABS 0.1 08/16/2020 1945   BASOSABS 0.0 08/28/2014 0507    CMP Latest Ref Rng & Units 08/20/2020 08/19/2020 08/18/2020  Glucose 70 - 99 mg/dL 131(H) 141(H) 88  BUN 6 - 20 mg/dL 68(H) 81(H) 71(H)  Creatinine 0.61 - 1.24  mg/dL 3.42(H) 4.21(H) 3.55(H)  Sodium 135 - 145 mmol/L 140 138 140  Potassium 3.5 - 5.1 mmol/L 4.8 4.7 4.2  Chloride 98 - 111 mmol/L 109 105 111  CO2 22 - 32 mmol/L 23 22 21(L)  Calcium 8.9 - 10.3 mg/dL 8.2(L) 8.8(L) 7.8(L)  Total Protein 6.5 - 8.1 g/dL - - -  Total Bilirubin 0.3 - 1.2 mg/dL - - -  Alkaline Phos 38 - 126 U/L - - -  AST 15 - 41 U/L - - -  ALT 0 - 44 U/L - - -      Microbiology: Recent Results (from the past 240 hour(s))  Culture, blood (routine x 2)     Status: None (Preliminary result)   Collection Time: 08/17/20 12:41 AM   Specimen: Right Antecubital; Blood  Result Value Ref Range Status   Specimen Description RIGHT ANTECUBITAL  Final   Special Requests   Final    BOTTLES DRAWN AEROBIC AND ANAEROBIC Blood Culture adequate volume   Culture   Final    NO GROWTH 3 DAYS Performed at [facility], [address] [place], [place] [address]    Report Status PENDING  Incomplete  Culture, blood (routine x 2)     Status: None (Preliminary result)   Collection Time: 08/17/20 12:41 AM   Specimen: BLOOD LEFT HAND  Result Value Ref Range Status   Specimen Description BLOOD LEFT HAND  Final   Special Requests   Final    BOTTLES DRAWN AEROBIC AND ANAEROBIC Blood Culture results may not be optimal due to an excessive volume of blood received in culture bottles   Culture   Final    NO GROWTH 3 DAYS Performed at [facility], [address] [place], [place] [address]    Report Status PENDING  Incomplete  Respiratory Panel by RT PCR (Flu A&B, Covid) - Nasopharyngeal Swab     Status: None   Collection Time: 08/17/20  3:39 AM   Specimen: Nasopharyngeal Swab  Result Value Ref Range Status   SARS Coronavirus 2 by RT PCR NEGATIVE NEGATIVE Final    Comment: (NOTE) SARS-CoV-2 target nucleic acids are NOT DETECTED.  The SARS-CoV-2 RNA is generally detectable in upper respiratoy specimens during the  acute phase of infection. The lowest concentration of  SARS-CoV-2 viral copies this assay can detect is 131 copies/mL. A negative result does not preclude SARS-Cov-2 infection and should not be used as the sole basis for treatment or other patient management decisions. A negative result may occur with  improper specimen collection/handling, submission of specimen other than nasopharyngeal swab, presence of viral mutation(s) within the areas targeted by this assay, and inadequate number of viral copies (<131 copies/mL). A negative result must be combined with clinical observations, patient history, and epidemiological information. The expected result is Negative.  Fact Sheet for Patients:  PinkCheek.be  Fact Sheet for Healthcare Providers:  GravelBags.it  This test is no t yet approved or cleared by the [place] FDA and  has been authorized for detection and/or diagnosis of SARS-CoV-2 by FDA under an Emergency Use Authorization (EUA). This EUA will remain  in effect (meaning this test can be used) for the duration of the COVID-19 declaration under Section 564(b)(1) of the Act, 21 U.S.C. section 360bbb-3(b)(1), unless the authorization is terminated or revoked sooner.     Influenza A by PCR NEGATIVE NEGATIVE Final   Influenza B by PCR NEGATIVE NEGATIVE Final    Comment: (NOTE) The Xpert Xpress SARS-CoV-2/FLU/RSV assay is intended as an aid in  the diagnosis of influenza from Nasopharyngeal swab specimens and  should not be used as a sole basis for treatment. Nasal washings and  aspirates are unacceptable for Xpert Xpress SARS-CoV-2/FLU/RSV  testing.  Fact Sheet for Patients: PinkCheek.be  Fact Sheet for Healthcare Providers: GravelBags.it  This test is not yet approved or cleared by the [place] FDA and  has been authorized for detection and/or diagnosis of SARS-CoV-2 by  FDA under an Emergency Use  Authorization (EUA). This EUA will remain  in effect (meaning this test can be used) for the duration of the  Covid-19 declaration under Section 564(b)(1) of the Act, 21  U.S.C. section 360bbb-3(b)(1), unless the authorization is  terminated or revoked. Performed at [facility], [address] [place], [place] [address]   Aerobic/Anaerobic Culture (surgical/deep wound)     Status: None (Preliminary result)   Collection Time: 08/18/20  9:38 AM   Specimen: Wound  Result Value Ref Range Status   Specimen Description   Final    WOUND Performed at [facility], [address] [place], [place] [address]    Special Requests   Final    NONE Performed at [facility], [address] [place], [place] [address]    Gram Stain   Final    RARE WBC PRESENT,BOTH PMN AND MONONUCLEAR NO ORGANISMS SEEN Performed at [facility], [place] [address], [place], [place] [address]    Culture   Final    NORMAL SKIN FLORA NO ANAEROBES ISOLATED; CULTURE IN PROGRESS FOR 5 DAYS    Report Status PENDING  Incomplete  MRSA PCR Screening     Status: None   Collection Time: 08/19/20  6:15 AM   Specimen: Nasal Mucosa; Nasopharyngeal  Result Value Ref Range Status   MRSA by PCR NEGATIVE NEGATIVE Final    Comment:        The GeneXpert MRSA Assay (FDA approved for NASAL specimens only), is one component of a comprehensive MRSA colonization surveillance program. It is not intended to diagnose MRSA infection nor to guide or monitor treatment for MRSA infections. Performed at [facility], [address], [place], [place] [address]   Aerobic/Anaerobic Culture (surgical/deep  wound)     Status: None (Preliminary result)   Collection Time: 08/19/20  5:18 PM   Specimen: PATH Benign ortho; Tissue  Result Value Ref Range Status   Specimen Description   Final    BONE RIGHT 5TH BONE CULTURE Performed at [facility], [address] [place], [place]  [address]    Special Requests   Final    NONE Performed at [facility], [address] [place], [place] [address]    Gram Stain   Final    FEW WBC PRESENT,BOTH PMN AND MONONUCLEAR RARE GRAM POSITIVE COCCI    Culture   Final    NO GROWTH < 12 HOURS Performed at [facility], [place] [address], [place], [place] [address]    Report Status PENDING  Incomplete    ? Rt 5th toe ray amputaion Impression/Recommendation ?Diabetic foot infection- 5th toe acute wound and osteomyelitis- s/p ray resection Awaiting culture and pathology If this is curative surgery he will not need long term antibiotics Currently on vanco and cefepime- will DC vanco because of CKD Will do linezolid instead- reduce dose of cefepime to 1 gram IV q 12  DM- last Hba1c was 10.4- not well controlled- currently on insulin  CKD_ secondary to AIN from ampicillin and diabetets says he is the transplant list  Anemia due to CKD  Cardiomyopathy- ICD, on carvedilol CAD s/p CABG- on lipitor, ismo, amlodipine  H/o left TMA ? ___________________________________________________ Discussed with patient, requesting provider

## 2020-08-20 NOTE — Care Management Important Message (Signed)
Important Message  Patient Details  Name: [name] [name] MRN: [id number] Date of Birth: [birth year]   Medicare Important Message Given:  Yes     Dannette Barbara 08/20/2020, 11:03 AM

## 2020-08-20 NOTE — Progress Notes (Signed)
PHARMACIST - PHYSICIAN COMMUNICATION  CONCERNING:  Enoxaparin (Lovenox) for DVT Prophylaxis    RECOMMENDATION: Patient was prescribed enoxaparin 30 mg q24 hours for VTE prophylaxis.   Filed Weights   08/16/20 1939  Weight: 122.5 kg (270 lb)    Body mass index is 35.62 kg/m.  Estimated Creatinine Clearance: 32.7 mL/min (A) (by C-G formula based on SCr of 3.42 mg/dL (H)).   Based on [place] patient is candidate for enoxaparin 40 mg every 24 hours based on CrCl > 69ml/min  DESCRIPTION: Pharmacy has adjusted enoxaparin dose per [facility] policy.  Patient is now receiving enoxaparin 40 mg every 24 hours    Tawnya Crook, PharmD Clinical Pharmacist  08/20/2020 2:31 PM

## 2020-08-20 NOTE — Progress Notes (Signed)
Pharmacy Antibiotic Note  [name] [name] is a 59 y.o. male admitted on 08/17/2020 with osteomyelitis.  Pharmacy has been consulted for Vancomycin and Cefepime dosing.  Today, 08/20/20  --Scr improved back to baseline --POD # 1 for partial fifth ray amputation with podiatry --Bone cultures with rare GPC so far, ID pending  Plan: Adjust Cefepime to 2g IV q12h  Continue Vancomycin to 1750 mg IV q48h --Goal trough 15-20 mcg/mL for osteomyelitis  Monitor clinical picture, renal function, vanc levels if needed F/U C&S, abx de-escalation, LOT  Height: 6\' 1"  (185.4 cm) Weight: 122.5 kg (270 lb) IBW/kg (Calculated) : 79.9  Temp (24hrs), Avg:97.3 F (36.3 C), Min:96.6 F (35.9 C), Max:97.8 F (36.6 C)  Recent Labs  Lab 08/16/20 1945 08/17/20 0041 08/18/20 0521 08/19/20 0552 08/20/20 0453  WBC 10.8*  --  6.8  --  8.2  CREATININE 3.63*  --  3.55* 4.21* 3.42*  LATICACIDVEN  --  1.7  --   --   --     Estimated Creatinine Clearance: 32.7 mL/min (A) (by C-G formula based on SCr of 3.42 mg/dL (H)).    Allergies  Allergen Reactions  . 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 Doesn't believe he has an allergy to PCN as he has taken it previously, according to wife.  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 Doesn't believe he has an allergy to PCN as he has taken it previously, according to wife.    Antimicrobials this admission: CTX 10/2 x1 Vancomycin 10/2 >>  Cefepime 10/2  >>   Microbiology results: 10/4 Bone culture: Rare GPC, pending 10/3 Wound Cx: Normal skin flora, pending 10/2 BCx: NGTD   Thank you for allowing pharmacy to be a part of this patient's care.  Benita Gutter 08/20/2020 7:43 AM

## 2020-08-20 NOTE — Progress Notes (Signed)
Mobility Specialist - Progress Note   08/20/20 1500  Mobility  Activity Ambulated in room  Level of Assistance Modified independent, requires aide device or extra time  Assistive Device Front wheel walker  RLE Weight Bearing PWB  Distance Ambulated (ft) 20 ft  Mobility Response Tolerated well  Mobility performed by Mobility specialist  $Mobility charge 1 Mobility    Pre-mobility: 83 HR, 99% SpO2 Post-mobility: 79 HR, 98% SpO2   Pt was lying in bed upon arrival with wife present in room. Pt agreed to session. Pt was modI in bed mobility. Pt was able to stand to RW with minA. Pt ambulated 20' in room with LOB x1. Pt was able to manage PWB on RLE with no VC necessary. Further mobility was limited d/t pt c/o pain in R foot "5/10". Overall, pt tolerated session well. Pt was left in bed with all needs in reach. Nurse was notified.    [name] [name] Mobility Specialist 08/20/20, 3:46 PM

## 2020-08-21 LAB — BASIC METABOLIC PANEL
Anion gap: 7 (ref 5–15)
BUN: 68 mg/dL — ABNORMAL HIGH (ref 6–20)
CO2: 22 mmol/L (ref 22–32)
Calcium: 8.1 mg/dL — ABNORMAL LOW (ref 8.9–10.3)
Chloride: 108 mmol/L (ref 98–111)
Creatinine, Ser: 3.21 mg/dL — ABNORMAL HIGH (ref 0.61–1.24)
GFR calc non Af Amer: 20 mL/min — ABNORMAL LOW (ref >60)
Glucose, Bld: 170 mg/dL — ABNORMAL HIGH (ref 70–99)
Potassium: 5 mmol/L (ref 3.5–5.1)
Sodium: 137 mmol/L (ref 135–145)

## 2020-08-21 LAB — GLUCOSE, CAPILLARY
Glucose-Capillary: 132 mg/dL — ABNORMAL HIGH (ref 70–99)
Glucose-Capillary: 148 mg/dL — ABNORMAL HIGH (ref 70–99)

## 2020-08-21 LAB — CBC
HCT: 30.6 % — ABNORMAL LOW (ref 39.0–52.0)
Hemoglobin: 10 g/dL — ABNORMAL LOW (ref 13.0–17.0)
MCH: 31.2 pg (ref 26.0–34.0)
MCHC: 32.7 g/dL (ref 30.0–36.0)
MCV: 95.3 fL (ref 80.0–100.0)
Platelets: 132 10*3/uL — ABNORMAL LOW (ref 150–400)
RBC: 3.21 MIL/uL — ABNORMAL LOW (ref 4.22–5.81)
RDW: 13.1 % (ref 11.5–15.5)
WBC: 6.7 10*3/uL (ref 4.0–10.5)
nRBC: 0 % (ref 0.0–0.2)

## 2020-08-21 MED ORDER — LINEZOLID 600 MG PO TABS
600.0000 mg | ORAL_TABLET | Freq: Two times a day (BID) | ORAL | 0 refills | Status: AC
Start: 2020-08-21 — End: 2020-08-28

## 2020-08-21 MED ORDER — HYDROCODONE-ACETAMINOPHEN 5-325 MG PO TABS
1.0000 | ORAL_TABLET | ORAL | 0 refills | Status: AC | PRN
Start: 2020-08-21 — End: 2020-08-26

## 2020-08-21 NOTE — Progress Notes (Signed)
Orthopedic Tech Progress Note Patient Details:  [name] [name] 1961/02/20 [id number] Called in order to [facility] for a STAT [name] Patient ID: [name] [name], male   DOB: February 23, 1961, 59 y.o.   MRN: [id number]   [name] 08/21/2020, 2:06 PM

## 2020-08-21 NOTE — TOC Transition Note (Signed)
Transition of Care [facility]) - CM/SW Discharge Note   Patient Details  Name: [name] [name] MRN: [id number] Date of Birth: 1961-07-27  Transition of Care [facility] CM/SW Contact:  Candie Chroman, LCSW Phone Number: 08/21/2020, 1:24 PM   Clinical Narrative:  CSW met with patient. Wife at bedside. CSW introduced role and explained that PT recommendations would be discussed. Patient and his wife declined home health at this time. They believe his physical therapy needs are no so much strength concerns but just learning how to walk with his new boot. CSW encouraged them to reach out to his PCP if they change their minds once he gets home. Patient's wife asked about financial assistance with hospital bill and copays for outpatient appointments. She said they had qualified for assistance at [facility]. CSW sent information to financial counselor. No further concerns. Patient has orders to discharge home today. CSW signing off.   Final next level of care: Home/Self Care Barriers to Discharge: No Barriers Identified   Patient Goals and CMS Choice        Discharge Placement                       Discharge Plan and Services     Post Acute Care Choice: NA                               Social Determinants of Health (SDOH) Interventions     Readmission Risk Interventions Readmission Risk Prevention Plan 08/01/2019  Transportation Screening Complete  PCP or Specialist Appt within 3-5 Days Complete  HRI or [place] Complete  Social Work Consult for [place] Planning/Counseling Complete  Palliative Care Screening Not Applicable  Medication Review [employer]) Complete  Some recent data might be hidden

## 2020-08-21 NOTE — Hospital Course (Signed)
Summary from Dr. Kurtis Bushman 10/5: "[name] [name] is a 59 y.o. male with medical history significant of CAD s/p CABG, chronic systolic HF, CKD III, HTN, ischemic cardiomyopathy w/ EF 30-35% Sept '15 sent in by his podiatrist for osteomyelitis.  Admitted to the hospital service.  Podiatry was consulted.  X-ray consistent with acute osteomyelitis of the right metatarsophalangeal joint.  Had worsening erythema and warmth over the last few days.  He was admitted by podiatry for IV antibiotics and surgical debridement. He is status post Right partial fifth ray amputation by Dr. Luana Shu on 10/4"

## 2020-08-21 NOTE — Discharge Summary (Signed)
Physician Discharge Summary  [name] [name] LOV:[id number] DOB: March 04, 1961 DOA: 08/17/2020  PCP: Tracie Harrier, MD  Admit date: 08/17/2020 Discharge date: 08/21/2020  Admitted From: home Disposition:  home  Recommendations for Outpatient Follow-up:  1. Follow up with PCP in 1-2 weeks 2. Please obtain BMP/CBC in one week 3. Please follow up with podiatry, Dr. Luana Shu in 1 week  Home Health: No  Equipment/Devices: Wedge shoe   Discharge Condition: Stable  CODE STATUS: Full  Diet recommendation: Heart Healthy / Carb Modified   Discharge Diagnoses: Active Problems:   Diabetes mellitus type 2 with complications (HCC)   Hypercholesterolemia   Chronic systolic heart failure (HCC)   HTN (hypertension)   Acute osteomyelitis of right foot ([place])    Summary of HPI and Hospital Course:  Summary from Dr. Kurtis Bushman 10/5: "[name] [name] is a 59 y.o. male with medical history significant of CAD s/p CABG, chronic systolic HF, CKD III, HTN, ischemic cardiomyopathy w/ EF 30-35% Sept '15 sent in by his podiatrist for osteomyelitis.  Admitted to the hospital service.  Podiatry was consulted.  X-ray consistent with acute osteomyelitis of the right metatarsophalangeal joint.  Had worsening erythema and warmth over the last few days.  He was admitted by podiatry for IV antibiotics and surgical debridement. He is status post Right partial fifth ray amputation by Dr. Luana Shu on 10/4"    Osteomyelitis right 5 th metatarsal -present on admission Per podiatry wound probe to bone, x-ray from clinic revealed evidence of OM at fifth MTP J with associated ulceration, no gas in tissue.  Arterial Dopplers within normal limits, no vascular consultation was required. Initially treated with empiric vancomycin and cefepime. Underwent amputation 10/4.  Operative cultures obtained.  ID was consulted.  Patient is to remain partial weightbearing in wedge shoe for ambulation.   Discharged with 7 days linezolid to complete  course of antibiotics.     Discharge Instructions   Discharge Instructions    Call MD for:  extreme fatigue   Complete by: As directed    Call MD for:  persistant dizziness or light-headedness   Complete by: As directed    Call MD for:  severe uncontrolled pain   Complete by: As directed    Call MD for:  temperature >100.4   Complete by: As directed    Diet - low sodium heart healthy   Complete by: As directed    Discharge instructions   Complete by: As directed    Take linezolid (antibiotic) exactly as prescribed for 7 days, until completely gone.  Weight-bearing only on the right heel.  Follow up with Dr. Luana Shu, podiatry, in 1 week.  Use stool softeners with pain medications for prevent constipation.   Increase activity slowly   Complete by: As directed    No dressing needed   Complete by: As directed      Allergies as of 08/21/2020      Reactions   Unasyn [ampicillin-sulbactam Sodium] Anaphylaxis   Allergic interstitial nephritis to Unasyn/Zosyn in 2014. Doesn't believe he has an allergy to PCN as he has taken it previously, according to wife.   Zosyn [piperacillin Sod-tazobactam So] Anaphylaxis   Allergic interstitial nephritis to Unasyn/Zosyn in 2014. Doesn't believe he has an allergy to PCN as he has taken it previously, according to wife.      Medication List    STOP taking these medications   sulfamethoxazole-trimethoprim 800-160 MG tablet Commonly known as: BACTRIM DS     TAKE these medications  acetaminophen 325 MG tablet Commonly known as: TYLENOL Take by mouth every 6 (six) hours as needed for mild pain or moderate pain.   allopurinol 100 MG tablet Commonly known as: ZYLOPRIM Take 100 mg by mouth daily.   amLODipine 2.5 MG tablet Commonly known as: NORVASC Take 2.5 mg by mouth daily.   aspirin EC 81 MG tablet Take 81 mg by mouth daily.   atorvastatin 40 MG tablet Commonly known as: LIPITOR TAKE 1 TABLET (40 MG TOTAL) BY MOUTH AT  BEDTIME.   carvedilol 6.25 MG tablet Commonly known as: COREG TAKE 1 TABLET TWICE DAILY   ferrous sulfate 325 (65 FE) MG tablet Take 325 mg by mouth daily.   glimepiride 4 MG tablet Commonly known as: AMARYL Take 4 mg by mouth in the morning and at bedtime.   HYDROcodone-acetaminophen 5-325 MG tablet Commonly known as: NORCO/VICODIN Take 1 tablet by mouth every 4 (four) hours as needed for up to 5 days for moderate pain or severe pain. What changed:   when to take this  reasons to take this   insulin aspart 100 UNIT/ML injection Commonly known as: novoLOG Inject 0-9 Units into the skin 3 (three) times daily with meals. What changed: additional instructions   isosorbide mononitrate 60 MG 24 hr tablet Commonly known as: IMDUR Take 1 tablet (60 mg total) by mouth daily.   linezolid 600 MG tablet Commonly known as: ZYVOX Take 1 tablet (600 mg total) by mouth every 12 (twelve) hours for 7 days.   multivitamin capsule Take 1 capsule by mouth daily.   nitroGLYCERIN 0.4 MG SL tablet Commonly known as: Nitrostat Place 1 tablet (0.4 mg total) under the tongue every 5 (five) minutes as needed for chest pain.   NovoLIN N 100 UNIT/ML injection Generic drug: insulin NPH Human Inject 20 Units into the skin in the morning and at bedtime.   sodium bicarbonate 650 MG tablet Take 1,300 mg by mouth 2 (two) times daily.   torsemide 20 MG tablet Commonly known as: DEMADEX Take 40 mg by mouth daily.   VITAMIN B 12 PO Take 1,000 mcg by mouth at bedtime.   zolpidem 5 MG tablet Commonly known as: AMBIEN Take 5 mg by mouth at bedtime as needed for sleep.            Discharge Care Instructions  (From admission, onward)         Start     Ordered   08/21/20 0000  No dressing needed        08/21/20 1237          Allergies  Allergen Reactions  . 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 Doesn't believe  he has an allergy to PCN as he has taken it previously, according to wife.  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 Doesn't believe he has an allergy to PCN as he has taken it previously, according to wife.    Consultations:  Podiatry  Infectious Disease    Procedures/Studies: DG Chest 2 View  Result Date: 08/09/2020 CLINICAL DATA:  Chronic renal failure. Pre assessment for renal transplant EXAM: CHEST - 2 VIEW COMPARISON:  October 04, 2019 FINDINGS: Lungs are clear. Heart is slightly enlarged. Pacemaker lead attached to right ventricle. Status post coronary artery bypass grafting. No adenopathy. No bone lesions. IMPRESSION: Lungs clear. Heart is slightly enlarged. Pacemaker lead attached to right ventricle. Status post  coronary artery bypass grafting. Electronically Signed   By: Lowella Grip III M.D.   On: 08/09/2020 10:54   US ARTERIAL ABI (SCREENING LOWER EXTREMITY)  Result Date: 08/17/2020 CLINICAL DATA:  Previous left toe amputations. Diabetes, hypertension, obesity EXAM: NONINVASIVE PHYSIOLOGIC VASCULAR STUDY OF BILATERAL LOWER EXTREMITIES TECHNIQUE: Evaluation of both lower extremities were performed at rest, including calculation of ankle-brachial indices with single level Doppler, pressure and pulse volume recording. COMPARISON:  None available FINDINGS: Right ABI:  1.16 Left ABI:  1.10 Right Lower Extremity:  Multiphasic waveforms at the ankle. Left Lower Extremity: Multiphasic distal posterior tibial arterial waveforms. IMPRESSION: No evidence of hemodynamically significant lower extremity arterial occlusive disease at rest. ABIs may be artificially elevated in diabetics secondary to incompressible vessel calcifications (medial arterial sclerosis of [place]). Electronically Signed   By: Lucrezia Europe M.D.   On: 08/17/2020 11:07   DG Foot Complete Right  Result Date: 08/19/2020 CLINICAL DATA:  Postop.  EXAM: RIGHT FOOT COMPLETE - 3+ VIEW COMPARISON:  None. FINDINGS: Transmetatarsal amputation of the fifth ray. Resection margin is smooth. Expected postsurgical change in the adjacent soft tissues with air and edema. Overlying dressing in place. There are hammertoe deformity of the digits and mild degenerative change of the first metatarsal phalangeal joint. IMPRESSION: Transmetatarsal amputation of the fifth ray. No immediate postoperative complication. Electronically Signed   By: Keith Rake M.D.   On: 08/19/2020 19:21       Subjective: Pt seen with wife at bedside today.  Reports good pain control.  No fever/chills or other acute complaints.     Discharge Exam: Vitals:   08/21/20 0732 08/21/20 1200  BP: (!) 155/76 (!) 162/80  Pulse: 78 75  Resp: 18 16  Temp: 98.6 F (37 C) 97.8 F (36.6 C)  SpO2: 97% 98%   Vitals:   08/21/20 0017 08/21/20 0331 08/21/20 0732 08/21/20 1200  BP: 129/67 (!) 142/74 (!) 155/76 (!) 162/80  Pulse: 87 79 78 75  Resp: 18 18 18 16   Temp: 98 F (36.7 C) 98 F (36.7 C) 98.6 F (37 C) 97.8 F (36.6 C)  TempSrc: Oral Oral Oral Oral  SpO2: 99% 98% 97% 98%  Weight:      Height:        General: Pt is alert, awake, not in acute distress Cardiovascular: RRR, S1/S2 +, no rubs, no gallops Respiratory: CTA bilaterally, no wheezing, no rhonchi Abdominal: Soft, NT, ND, bowel sounds + Extremities: no edema, no cyanosis, R foot incision clean and healing well    The results of significant diagnostics from this hospitalization (including imaging, microbiology, ancillary and laboratory) are listed below for reference.     Microbiology: Recent Results (from the past 240 hour(s))  Culture, blood (routine x 2)     Status: None (Preliminary result)   Collection Time: 08/17/20 12:41 AM   Specimen: Right Antecubital; Blood  Result Value Ref Range Status   Specimen Description RIGHT ANTECUBITAL  Final   Special Requests   Final    BOTTLES DRAWN AEROBIC AND  ANAEROBIC Blood Culture adequate volume   Culture   Final    NO GROWTH 4 DAYS Performed at [facility], [address] [place], [place] [address]    Report Status PENDING  Incomplete  Culture, blood (routine x 2)     Status: None (Preliminary result)   Collection Time: 08/17/20 12:41 AM   Specimen: BLOOD LEFT HAND  Result Value Ref Range Status   Specimen Description BLOOD LEFT HAND  Final   Special Requests   Final    BOTTLES DRAWN AEROBIC AND ANAEROBIC Blood Culture results may not be optimal due to an excessive volume of blood received in culture bottles   Culture   Final    NO GROWTH 4 DAYS Performed at [facility], [address], [place], [place] [address]    Report Status PENDING  Incomplete  Respiratory Panel by RT PCR (Flu A&B, Covid) - Nasopharyngeal Swab     Status: None   Collection Time: 08/17/20  3:39 AM   Specimen: Nasopharyngeal Swab  Result Value Ref Range Status   SARS Coronavirus 2 by RT PCR NEGATIVE NEGATIVE Final    Comment: (NOTE) SARS-CoV-2 target nucleic acids are NOT DETECTED.  The SARS-CoV-2 RNA is generally detectable in upper respiratoy specimens during the acute phase of infection. The lowest concentration of SARS-CoV-2 viral copies this assay can detect is 131 copies/mL. A negative result does not preclude SARS-Cov-2 infection and should not be used as the sole basis for treatment or other patient management decisions. A negative result may occur with  improper specimen collection/handling, submission of specimen other than nasopharyngeal swab, presence of viral mutation(s) within the areas targeted by this assay, and inadequate number of viral copies (<131 copies/mL). A negative result must be combined with clinical observations, patient history, and epidemiological information. The expected result is Negative.  Fact Sheet for Patients:  PinkCheek.be  Fact Sheet for Healthcare Providers:   GravelBags.it  This test is no t yet approved or cleared by the [place] FDA and  has been authorized for detection and/or diagnosis of SARS-CoV-2 by FDA under an Emergency Use Authorization (EUA). This EUA will remain  in effect (meaning this test can be used) for the duration of the COVID-19 declaration under Section 564(b)(1) of the Act, 21 U.S.C. section 360bbb-3(b)(1), unless the authorization is terminated or revoked sooner.     Influenza A by PCR NEGATIVE NEGATIVE Final   Influenza B by PCR NEGATIVE NEGATIVE Final    Comment: (NOTE) The Xpert Xpress SARS-CoV-2/FLU/RSV assay is intended as an aid in  the diagnosis of influenza from Nasopharyngeal swab specimens and  should not be used as a sole basis for treatment. Nasal washings and  aspirates are unacceptable for Xpert Xpress SARS-CoV-2/FLU/RSV  testing.  Fact Sheet for Patients: PinkCheek.be  Fact Sheet for Healthcare Providers: GravelBags.it  This test is not yet approved or cleared by the [place] FDA and  has been authorized for detection and/or diagnosis of SARS-CoV-2 by  FDA under an Emergency Use Authorization (EUA). This EUA will remain  in effect (meaning this test can be used) for the duration of the  Covid-19 declaration under Section 564(b)(1) of the Act, 21  U.S.C. section 360bbb-3(b)(1), unless the authorization is  terminated or revoked. Performed at [facility], [address] [place], [place] [address]   Aerobic/Anaerobic Culture (surgical/deep wound)     Status: None (Preliminary result)   Collection Time: 08/18/20  9:38 AM   Specimen: Wound  Result Value Ref Range Status   Specimen Description   Final    WOUND Performed at [facility], [address] [place], [place] [address]    Special Requests   Final    NONE Performed at [facility], [address]  [place],  [address]    Gram Stain   Final    RARE WBC PRESENT,BOTH PMN AND MONONUCLEAR NO ORGANISMS SEEN Performed at [facility],  [address] [place], [place] [address]    Culture   Final    NORMAL SKIN FLORA NO ANAEROBES ISOLATED; CULTURE IN PROGRESS FOR 5 DAYS    Report Status PENDING  Incomplete  MRSA PCR Screening     Status: None   Collection Time: 08/19/20  6:15 AM   Specimen: Nasal Mucosa; Nasopharyngeal  Result Value Ref Range Status   MRSA by PCR NEGATIVE NEGATIVE Final    Comment:        The GeneXpert MRSA Assay (FDA approved for NASAL specimens only), is one component of a comprehensive MRSA colonization surveillance program. It is not intended to diagnose MRSA infection nor to guide or monitor treatment for MRSA infections. Performed at [facility], [address] [place], [place] [address]   Aerobic/Anaerobic Culture (surgical/deep wound)     Status: None (Preliminary result)   Collection Time: 08/19/20  5:18 PM   Specimen: PATH Benign ortho; Tissue  Result Value Ref Range Status   Specimen Description   Final    BONE RIGHT 5TH BONE CULTURE Performed at [facility], [address] [place], [place] [address]    Special Requests   Final    NONE Performed at [facility], [address] [place], [place] [address]    Gram Stain   Final    FEW WBC PRESENT,BOTH PMN AND MONONUCLEAR RARE GRAM POSITIVE COCCI    Culture   Final    NO GROWTH 2 DAYS NO ANAEROBES ISOLATED; CULTURE IN PROGRESS FOR 5 DAYS Performed at [facility] [address], [place], [place] [address]    Report Status PENDING  Incomplete     Labs: BNP (last 3 results) Recent Labs    08/17/20 1104  BNP 419.3*   Basic Metabolic Panel: Recent Labs  Lab 08/16/20 1945 08/18/20 0521 08/19/20 0552 08/20/20 0453 08/21/20 0415  NA 138 140 138 140 137  K 5.0 4.2 4.7 4.8 5.0  CL 102 111 105 109 108  CO2 23 21* 22 23 22   GLUCOSE 194*  88 141* 131* 170*  BUN 83* 71* 81* 68* 68*  CREATININE 3.63* 3.55* 4.21* 3.42* 3.21*  CALCIUM 8.7* 7.8* 8.8* 8.2* 8.1*   Liver Function Tests: Recent Labs  Lab 08/16/20 1945  AST 21  ALT 17  ALKPHOS 64  BILITOT 0.8  PROT 7.9  ALBUMIN 3.8   No results for input(s): LIPASE, AMYLASE in the last 168 hours. No results for input(s): AMMONIA in the last 168 hours. CBC: Recent Labs  Lab 08/16/20 1945 08/18/20 0521 08/20/20 0453 08/21/20 0415  WBC 10.8* 6.8 8.2 6.7  NEUTROABS 7.8*  --   --   --   HGB 11.9* 10.5* 10.8* 10.0*  HCT 36.2* 31.8* 31.2* 30.6*  MCV 96.3 95.2 92.6 95.3  PLT 194 150 146* 132*   Cardiac Enzymes: No results for input(s): CKTOTAL, CKMB, CKMBINDEX, TROPONINI in the last 168 hours. BNP: Invalid input(s): POCBNP CBG: Recent Labs  Lab 08/20/20 1105 08/20/20 1618 08/20/20 2120 08/21/20 0754 08/21/20 1157  GLUCAP 166* 214* 125* 132* 148*   D-Dimer No results for input(s): DDIMER in the last 72 hours. Hgb A1c No results for input(s): HGBA1C in the last 72 hours. Lipid Profile No results for input(s): CHOL, HDL, LDLCALC, TRIG, CHOLHDL, LDLDIRECT in the last 72 hours. Thyroid function studies No results for input(s): TSH, T4TOTAL, T3FREE, THYROIDAB in the last 72 hours.  Invalid input(s): FREET3 Anemia work up No results for input(s):  VITAMINB12, FOLATE, FERRITIN, TIBC, IRON, RETICCTPCT in the last 72 hours. Urinalysis    Component Value Date/Time   COLORURINE YELLOW (A) 08/14/2019 1724   APPEARANCEUR CLEAR (A) 08/14/2019 1724   APPEARANCEUR Clear 08/08/2013 1439   LABSPEC 1.011 08/14/2019 1724   LABSPEC 1.006 08/08/2013 1439   PHURINE 7.0 08/14/2019 1724   GLUCOSEU >=500 (A) 08/14/2019 1724   GLUCOSEU Negative 08/08/2013 1439   HGBUR NEGATIVE 08/14/2019 1724   BILIRUBINUR NEGATIVE 08/14/2019 1724   BILIRUBINUR Negative 08/08/2013 1439   KETONESUR NEGATIVE 08/14/2019 1724   PROTEINUR 100 (A) 08/14/2019 1724   UROBILINOGEN 1.0 05/18/2014  0659   NITRITE NEGATIVE 08/14/2019 1724   LEUKOCYTESUR NEGATIVE 08/14/2019 1724   LEUKOCYTESUR Negative 08/08/2013 1439   Sepsis Labs Invalid input(s): PROCALCITONIN,  WBC,  LACTICIDVEN Microbiology Recent Results (from the past 240 hour(s))  Culture, blood (routine x 2)     Status: None (Preliminary result)   Collection Time: 08/17/20 12:41 AM   Specimen: Right Antecubital; Blood  Result Value Ref Range Status   Specimen Description RIGHT ANTECUBITAL  Final   Special Requests   Final    BOTTLES DRAWN AEROBIC AND ANAEROBIC Blood Culture adequate volume   Culture   Final    NO GROWTH 4 DAYS Performed at [facility], [address], [place], [place] [address]    Report Status PENDING  Incomplete  Culture, blood (routine x 2)     Status: None (Preliminary result)   Collection Time: 08/17/20 12:41 AM   Specimen: BLOOD LEFT HAND  Result Value Ref Range Status   Specimen Description BLOOD LEFT HAND  Final   Special Requests   Final    BOTTLES DRAWN AEROBIC AND ANAEROBIC Blood Culture results may not be optimal due to an excessive volume of blood received in culture bottles   Culture   Final    NO GROWTH 4 DAYS Performed at [facility], [address] [place], [place] [address]    Report Status PENDING  Incomplete  Respiratory Panel by RT PCR (Flu A&B, Covid) - Nasopharyngeal Swab     Status: None   Collection Time: 08/17/20  3:39 AM   Specimen: Nasopharyngeal Swab  Result Value Ref Range Status   SARS Coronavirus 2 by RT PCR NEGATIVE NEGATIVE Final    Comment: (NOTE) SARS-CoV-2 target nucleic acids are NOT DETECTED.  The SARS-CoV-2 RNA is generally detectable in upper respiratoy specimens during the acute phase of infection. The lowest concentration of SARS-CoV-2 viral copies this assay can detect is 131 copies/mL. A negative result does not preclude SARS-Cov-2 infection and should not be used as the sole basis for treatment or other patient management  decisions. A negative result may occur with  improper specimen collection/handling, submission of specimen other than nasopharyngeal swab, presence of viral mutation(s) within the areas targeted by this assay, and inadequate number of viral copies (<131 copies/mL). A negative result must be combined with clinical observations, patient history, and epidemiological information. The expected result is Negative.  Fact Sheet for Patients:  PinkCheek.be  Fact Sheet for Healthcare Providers:  GravelBags.it  This test is no t yet approved or cleared by the [place] FDA and  has been authorized for detection and/or diagnosis of SARS-CoV-2 by FDA under an Emergency Use Authorization (EUA). This EUA will remain  in effect (meaning this test can be used) for the duration of the COVID-19 declaration under Section 564(b)(1) of the Act, 21 U.S.C. section 360bbb-3(b)(1), unless the authorization is terminated  or revoked sooner.     Influenza A by PCR NEGATIVE NEGATIVE Final   Influenza B by PCR NEGATIVE NEGATIVE Final    Comment: (NOTE) The Xpert Xpress SARS-CoV-2/FLU/RSV assay is intended as an aid in  the diagnosis of influenza from Nasopharyngeal swab specimens and  should not be used as a sole basis for treatment. Nasal washings and  aspirates are unacceptable for Xpert Xpress SARS-CoV-2/FLU/RSV  testing.  Fact Sheet for Patients: PinkCheek.be  Fact Sheet for Healthcare Providers: GravelBags.it  This test is not yet approved or cleared by the [place] FDA and  has been authorized for detection and/or diagnosis of SARS-CoV-2 by  FDA under an Emergency Use Authorization (EUA). This EUA will remain  in effect (meaning this test can be used) for the duration of the  Covid-19 declaration under Section 564(b)(1) of the Act, 21  U.S.C. section 360bbb-3(b)(1), unless the  authorization is  terminated or revoked. Performed at [facility], [address] [place], [place] [address]   Aerobic/Anaerobic Culture (surgical/deep wound)     Status: None (Preliminary result)   Collection Time: 08/18/20  9:38 AM   Specimen: Wound  Result Value Ref Range Status   Specimen Description   Final    WOUND Performed at [facility], [address] [place], [place] [address]    Special Requests   Final    NONE Performed at [facility], [address] [place], [place] [address]    Gram Stain   Final    RARE WBC PRESENT,BOTH PMN AND MONONUCLEAR NO ORGANISMS SEEN Performed at [facility], [place] [address] [place], [place] [address]    Culture   Final    NORMAL SKIN FLORA NO ANAEROBES ISOLATED; CULTURE IN PROGRESS FOR 5 DAYS    Report Status PENDING  Incomplete  MRSA PCR Screening     Status: None   Collection Time: 08/19/20  6:15 AM   Specimen: Nasal Mucosa; Nasopharyngeal  Result Value Ref Range Status   MRSA by PCR NEGATIVE NEGATIVE Final    Comment:        The GeneXpert MRSA Assay (FDA approved for NASAL specimens only), is one component of a comprehensive MRSA colonization surveillance program. It is not intended to diagnose MRSA infection nor to guide or monitor treatment for MRSA infections. Performed at [facility], [address] [place], [place] [address]   Aerobic/Anaerobic Culture (surgical/deep wound)     Status: None (Preliminary result)   Collection Time: 08/19/20  5:18 PM   Specimen: PATH Benign ortho; Tissue  Result Value Ref Range Status   Specimen Description   Final    BONE RIGHT 5TH BONE CULTURE Performed at [facility], [address] [place], [place] [address]    Special Requests   Final    NONE Performed at [facility], [address] [place], [place] [address]    Gram Stain   Final    FEW WBC PRESENT,BOTH PMN AND MONONUCLEAR RARE GRAM POSITIVE  COCCI    Culture   Final    NO GROWTH 2 DAYS NO ANAEROBES ISOLATED; CULTURE IN PROGRESS FOR 5 DAYS Performed at [facility] [address], [place], [place] [address]    Report Status PENDING  Incomplete     Time coordinating discharge: Over 30 minutes  SIGNED:   Ezekiel Slocumb, DO [facility] 08/21/2020, 12:37 PM   If 7PM-7AM, please contact night-coverage [email or web address]

## 2020-08-21 NOTE — Progress Notes (Signed)
Pharmacy - Antimicrobial Stewardship (Benefit check and patient education)  Diabetic foot infection s/p ray resection. Per ID rec, linezolid 600mg  PO BID at discharge.  Benefit check revealed copay is $11.82. Called is [facility] in [place], they have in stock.  Patient and wife educated on how to take antibiotic, described possible food interactions and side effects. Questions answered  Doreene Eland, PharmD, BCPS.   Work Cell: [phone] 08/21/2020 12:50 PM

## 2020-08-21 NOTE — Progress Notes (Signed)
Pharmacy Antibiotic Note  [name] [name] is a 59 y.o. male admitted on 08/17/2020 with osteomyelitis.  Pharmacy has been consulted Cefepime dosing. ID has been consulted and antibiotic regimen has been converted from vancomycin + cefepime to linezolid + cefepime.   Today, 08/21/20  --Scr slightly improved today --POD # 2 for partial fifth ray amputation with podiatry --Bone cultures with rare GPC so far, ID pending  Plan: Contine Cefepime to 1g IV q12h per ID   Continue Linezolid 600 mg PO BID per ID  Monitor clinical picture, renal function, platelets while on linezolid F/U C&S, abx de-escalation, LOT  Height: 6\' 1"  (185.4 cm) Weight: 122.5 kg (270 lb) IBW/kg (Calculated) : 79.9  Temp (24hrs), Avg:98.1 F (36.7 C), Min:97.7 F (36.5 C), Max:98.6 F (37 C)  Recent Labs  Lab 08/16/20 1945 08/17/20 0041 08/18/20 0521 08/19/20 0552 08/20/20 0453 08/21/20 0415  WBC 10.8*  --  6.8  --  8.2 6.7  CREATININE 3.63*  --  3.55* 4.21* 3.42* 3.21*  LATICACIDVEN  --  1.7  --   --   --   --     Estimated Creatinine Clearance: 34.8 mL/min (A) (by C-G formula based on SCr of 3.21 mg/dL (H)).    Allergies  Allergen Reactions  . 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 Doesn't believe he has an allergy to PCN as he has taken it previously, according to wife.  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 Doesn't believe he has an allergy to PCN as he has taken it previously, according to wife.    Antimicrobials this admission: CTX 10/2 x1 Vancomycin 10/2 >> 10/5 Cefepime 10/2  >>  Linezolid 10/5 >>  Microbiology results: 10/4 Bone culture: Rare GPC, pending 10/3 Wound Cx: Normal skin flora, pending 10/2 BCx: NGTD   Thank you for allowing pharmacy to be a part of this patient's care.  Benita Gutter 08/21/2020 8:33 AM

## 2020-08-21 NOTE — Clinical Social Work Note (Signed)
Went by room to discuss PT recommendations. Patient on phone and said his wife would be here later so he asked CSW to follow up then.  Dayton Scrape, [place]

## 2020-08-21 NOTE — Progress Notes (Signed)
[facility]  ROUNDING NOTE   Subjective:   [name] [name] is 59 years old gentleman, with past medical history of CAD, hypertension, ICM with EF 30 to 35%, CKD stage III, came in to the hospital for IV antibiotics and possible surgical debridement of his right metatarsal phalangeal joint which has osteomyelitis.  Patient is consulted to nephrology for AKI. Patient progressing well after his surgery, pain is controlled with current medications.  Renal function is getting better.  He denies shortness of breath nausea or vomiting.   Objective:  Vital signs in last 24 hours:  Temp:  [97.8 F (36.6 C)-98.6 F (37 C)] 97.8 F (36.6 C) (10/06 1200) Pulse Rate:  [75-87] 75 (10/06 1200) Resp:  [16-18] 16 (10/06 1200) BP: (129-162)/(67-80) 162/80 (10/06 1200) SpO2:  [97 %-100 %] 98 % (10/06 1200)  Weight change:  Filed Weights   08/16/20 1939  Weight: 122.5 kg    Intake/Output: I/O last 3 completed shifts: In: 2167.9 [P.O.:1080; I.V.:887.9; IV Piggyback:200] Out: 2700 [Urine:2700]   Intake/Output this shift:  Total I/O In: 120 [P.O.:120] Out: 600 [Urine:600]  Physical Exam: General:  Resting in bed. in no acute distress  Head: Normocephalic, atraumatic. Moist oral mucosal membranes  Eyes: Anicteric  Neck: Supple, trachea at the midline  Lungs:   Respirations even and unlabored, lungs clear  Heart:  S1S2 no rubs or gallops  Abdomen:  Soft, nontender  Extremities:  No peripheral edema  Neurologic:  Awake, alert, oriented x 3  Skin:  No acute lesions or rashes noted, right foot with dressing    Basic Metabolic Panel: Recent Labs  Lab 08/16/20 1945 08/16/20 1945 08/18/20 0521 08/18/20 0521 08/19/20 0552 08/20/20 0453 08/21/20 0415  NA 138  --  140  --  138 140 137  K 5.0  --  4.2  --  4.7 4.8 5.0  CL 102  --  111  --  105 109 108  CO2 23  --  21*  --  22 23 22   GLUCOSE 194*  --  88  --  141* 131* 170*  BUN 83*  --  71*  --  81* 68* 68*  CREATININE 3.63*   --  3.55*  --  4.21* 3.42* 3.21*  CALCIUM 8.7*   < > 7.8*   < > 8.8* 8.2* 8.1*   < > = values in this interval not displayed.    Liver Function Tests: Recent Labs  Lab 08/16/20 1945  AST 21  ALT 17  ALKPHOS 64  BILITOT 0.8  PROT 7.9  ALBUMIN 3.8   No results for input(s): LIPASE, AMYLASE in the last 168 hours. No results for input(s): AMMONIA in the last 168 hours.  CBC: Recent Labs  Lab 08/16/20 1945 08/18/20 0521 08/20/20 0453 08/21/20 0415  WBC 10.8* 6.8 8.2 6.7  NEUTROABS 7.8*  --   --   --   HGB 11.9* 10.5* 10.8* 10.0*  HCT 36.2* 31.8* 31.2* 30.6*  MCV 96.3 95.2 92.6 95.3  PLT 194 150 146* 132*    Cardiac Enzymes: No results for input(s): CKTOTAL, CKMB, CKMBINDEX, TROPONINI in the last 168 hours.  BNP: Invalid input(s): POCBNP  CBG: Recent Labs  Lab 08/20/20 1105 08/20/20 1618 08/20/20 2120 08/21/20 0754 08/21/20 1157  GLUCAP 166* 214* 125* 132* 148*    Microbiology: Results for orders placed or performed during the hospital encounter of 08/17/20  Culture, blood (routine x 2)     Status: None (Preliminary result)   Collection Time:  08/17/20 12:41 AM   Specimen: Right Antecubital; Blood  Result Value Ref Range Status   Specimen Description RIGHT ANTECUBITAL  Final   Special Requests   Final    BOTTLES DRAWN AEROBIC AND ANAEROBIC Blood Culture adequate volume   Culture   Final    NO GROWTH 4 DAYS Performed at [facility], [address], [place], [place] [address]    Report Status PENDING  Incomplete  Culture, blood (routine x 2)     Status: None (Preliminary result)   Collection Time: 08/17/20 12:41 AM   Specimen: BLOOD LEFT HAND  Result Value Ref Range Status   Specimen Description BLOOD LEFT HAND  Final   Special Requests   Final    BOTTLES DRAWN AEROBIC AND ANAEROBIC Blood Culture results may not be optimal due to an excessive volume of blood received in culture bottles   Culture   Final    NO GROWTH 4 DAYS Performed at  [facility], [address], [place], [place] [address]    Report Status PENDING  Incomplete  Respiratory Panel by RT PCR (Flu A&B, Covid) - Nasopharyngeal Swab     Status: None   Collection Time: 08/17/20  3:39 AM   Specimen: Nasopharyngeal Swab  Result Value Ref Range Status   SARS Coronavirus 2 by RT PCR NEGATIVE NEGATIVE Final    Comment: (NOTE) SARS-CoV-2 target nucleic acids are NOT DETECTED.  The SARS-CoV-2 RNA is generally detectable in upper respiratoy specimens during the acute phase of infection. The lowest concentration of SARS-CoV-2 viral copies this assay can detect is 131 copies/mL. A negative result does not preclude SARS-Cov-2 infection and should not be used as the sole basis for treatment or other patient management decisions. A negative result may occur with  improper specimen collection/handling, submission of specimen other than nasopharyngeal swab, presence of viral mutation(s) within the areas targeted by this assay, and inadequate number of viral copies (<131 copies/mL). A negative result must be combined with clinical observations, patient history, and epidemiological information. The expected result is Negative.  Fact Sheet for Patients:  PinkCheek.be  Fact Sheet for Healthcare Providers:  GravelBags.it  This test is no t yet approved or cleared by the [place] FDA and  has been authorized for detection and/or diagnosis of SARS-CoV-2 by FDA under an Emergency Use Authorization (EUA). This EUA will remain  in effect (meaning this test can be used) for the duration of the COVID-19 declaration under Section 564(b)(1) of the Act, 21 U.S.C. section 360bbb-3(b)(1), unless the authorization is terminated or revoked sooner.     Influenza A by PCR NEGATIVE NEGATIVE Final   Influenza B by PCR NEGATIVE NEGATIVE Final    Comment: (NOTE) The Xpert Xpress SARS-CoV-2/FLU/RSV assay is  intended as an aid in  the diagnosis of influenza from Nasopharyngeal swab specimens and  should not be used as a sole basis for treatment. Nasal washings and  aspirates are unacceptable for Xpert Xpress SARS-CoV-2/FLU/RSV  testing.  Fact Sheet for Patients: PinkCheek.be  Fact Sheet for Healthcare Providers: GravelBags.it  This test is not yet approved or cleared by the [place] FDA and  has been authorized for detection and/or diagnosis of SARS-CoV-2 by  FDA under an Emergency Use Authorization (EUA). This EUA will remain  in effect (meaning this test can be used) for the duration of the  Covid-19 declaration under Section 564(b)(1) of the Act, 21  U.S.C. section 360bbb-3(b)(1), unless the authorization is  terminated or revoked. Performed  at [facility], [address] [place], [place] [address]   Aerobic/Anaerobic Culture (surgical/deep wound)     Status: None (Preliminary result)   Collection Time: 08/18/20  9:38 AM   Specimen: Wound  Result Value Ref Range Status   Specimen Description   Final    WOUND Performed at [facility], [address] [place], [place] [address]    Special Requests   Final    NONE Performed at [facility], [address] [place], [place] [address]    Gram Stain   Final    RARE WBC PRESENT,BOTH PMN AND MONONUCLEAR NO ORGANISMS SEEN Performed at [facility], [place], [place], [place] [address]    Culture   Final    NORMAL SKIN FLORA NO ANAEROBES ISOLATED; CULTURE IN PROGRESS FOR 5 DAYS    Report Status PENDING  Incomplete  MRSA PCR Screening     Status: None   Collection Time: 08/19/20  6:15 AM   Specimen: Nasal Mucosa; Nasopharyngeal  Result Value Ref Range Status   MRSA by PCR NEGATIVE NEGATIVE Final    Comment:        The GeneXpert MRSA Assay (FDA approved for NASAL specimens only), is one component of a comprehensive MRSA  colonization surveillance program. It is not intended to diagnose MRSA infection nor to guide or monitor treatment for MRSA infections. Performed at [facility], [address] [place], [place] [address]   Aerobic/Anaerobic Culture (surgical/deep wound)     Status: None (Preliminary result)   Collection Time: 08/19/20  5:18 PM   Specimen: PATH Benign ortho; Tissue  Result Value Ref Range Status   Specimen Description   Final    BONE RIGHT 5TH BONE CULTURE Performed at [facility], [address] [place], [place] [address]    Special Requests   Final    NONE Performed at [facility], [address] [place], [place] [address]    Gram Stain   Final    FEW WBC PRESENT,BOTH PMN AND MONONUCLEAR RARE GRAM POSITIVE COCCI    Culture   Final    NO GROWTH 2 DAYS NO ANAEROBES ISOLATED; CULTURE IN PROGRESS FOR 5 DAYS Performed at [facility] [address], [place], [place] [address]    Report Status PENDING  Incomplete    Coagulation Studies: No results for input(s): LABPROT, INR in the last 72 hours.  Urinalysis: No results for input(s): COLORURINE, LABSPEC, PHURINE, GLUCOSEU, HGBUR, BILIRUBINUR, KETONESUR, PROTEINUR, UROBILINOGEN, NITRITE, LEUKOCYTESUR in the last 72 hours.  Invalid input(s): APPERANCEUR    Imaging: DG Foot Complete Right  Result Date: 08/19/2020 CLINICAL DATA:  Postop. EXAM: RIGHT FOOT COMPLETE - 3+ VIEW COMPARISON:  None. FINDINGS: Transmetatarsal amputation of the fifth ray. Resection margin is smooth. Expected postsurgical change in the adjacent soft tissues with air and edema. Overlying dressing in place. There are hammertoe deformity of the digits and mild degenerative change of the first metatarsal phalangeal joint. IMPRESSION: Transmetatarsal amputation of the fifth ray. No immediate postoperative complication. Electronically Signed   By: Keith Rake M.D.   On: 08/19/2020 19:21     Medications:   . sodium  chloride 0 mL/hr at 08/19/20 0015   . allopurinol  100 mg Oral Daily  . amLODipine  2.5 mg Oral Daily  . atorvastatin  40 mg Oral QHS  . carvedilol  3.125 mg Oral BID WC  . enoxaparin (LOVENOX) injection  40 mg Subcutaneous Q24H  .  ferrous sulfate  325 mg Oral Daily  . insulin aspart  0-20 Units Subcutaneous TID WC  . isosorbide mononitrate  60 mg Oral Daily  . linezolid  600 mg Oral Q12H  . multivitamin with minerals  1 tablet Oral Daily  . sodium bicarbonate  1,300 mg Oral BID  . vitamin B-12  1,000 mcg Oral QHS   sodium chloride, acetaminophen **OR** acetaminophen, HYDROcodone-acetaminophen, magnesium hydroxide, morphine injection, nitroGLYCERIN, ondansetron **OR** ondansetron (ZOFRAN) IV, zolpidem  Assessment/ Plan:  [name] [name] is a 59 y.o.  male old gentleman, with past medical history of CAD, hypertension, ICM with EF 30 to 35%, CKD stage III, came in to the hospital for IV antibiotics and possible surgical debridement of his right metatarsal phalangeal joint which has osteomyelitis.  Patient is consulted to nephrology for AKI.  # Acute kidney injury with CKD stage IV  Lab Results  Component Value Date   CREATININE 3.21 (H) 08/21/2020   CREATININE 3.42 (H) 08/20/2020   CREATININE 4.21 (H) 08/19/2020   Baseline creatinine 3.25 with GFR 20 on 08/18/19 Creatinine getting better progressively, 3.21 today We will follow-up as outpatient if getting discharged   #DM type II with CKD.  HbA1c 8.7 on 08/16/2020 Currently on sliding scale Insulin  #Osteomyelitis of the right fifth metatarsal phalangeal joint Partial amputation done on 08/19/20 Patient progressing well,pain well controlled Infectious disease and Podiatry teams managing antibiotic treatment   #Hypertension Blood pressure readings not at the goal today Current antihypertensive regimen includes Coreg,Imdur  and amlodipine Will continue monitoring closely     LOS: [place] 10/6/20211:36 PM

## 2020-08-21 NOTE — Progress Notes (Signed)
Date of Admission:  08/17/2020      ID: [name] [name] is a 59 y.o. male  Active Problems:   Diabetes mellitus type 2 with complications ([place])   Hypercholesterolemia   Chronic systolic heart failure (HCC)   HTN (hypertension)   Acute osteomyelitis of right foot (HCC)    Subjective: Doing well No pain No fever  Medications:  . allopurinol  100 mg Oral Daily  . amLODipine  2.5 mg Oral Daily  . atorvastatin  40 mg Oral QHS  . carvedilol  3.125 mg Oral BID WC  . enoxaparin (LOVENOX) injection  40 mg Subcutaneous Q24H  . ferrous sulfate  325 mg Oral Daily  . insulin aspart  0-20 Units Subcutaneous TID WC  . isosorbide mononitrate  60 mg Oral Daily  . linezolid  600 mg Oral Q12H  . multivitamin with minerals  1 tablet Oral Daily  . sodium bicarbonate  1,300 mg Oral BID  . vitamin B-12  1,000 mcg Oral QHS    Objective: Vital signs in last 24 hours: Temp:  [97.7 F (36.5 C)-98.6 F (37 C)] 98.6 F (37 C) (10/06 0732) Pulse Rate:  [74-87] 78 (10/06 0732) Resp:  [14-18] 18 (10/06 0732) BP: (129-155)/(67-81) 155/76 (10/06 0732) SpO2:  [97 %-100 %] 97 % (10/06 0732)  PHYSICAL EXAM:  General: Alert, cooperative, no distress, appears stated age.  Head: Normocephalic, without obvious abnormality, atraumatic. Eyes: Conjunctivae clear, anicteric sclerae. Pupils are equal ENT Nares normal. No drainage or sinus tenderness. Lips, mucosa, and tongue normal. No Thrush Neck: Supple, symmetrical, no adenopathy, thyroid: non tender no carotid bruit and no JVD. Back: No CVA tenderness. Lungs: Clear to auscultation bilaterally. No Wheezing or Rhonchi. No rales. Heart: Regular rate and rhythm, no murmur, rub or gallop. Abdomen: Soft, non-tender,not distended. Bowel sounds normal. No masses Extremities:    Surgical site on rt foot looks clean , with no erythema Left Foot- TMA Skin: No rashes or lesions. Or bruising Lymph: Cervical, supraclavicular normal. Neurologic: Grossly  non-focal  Lab Results Recent Labs    08/20/20 0453 08/21/20 0415  WBC 8.2 6.7  HGB 10.8* 10.0*  HCT 31.2* 30.6*  NA 140 137  K 4.8 5.0  CL 109 108  CO2 23 22  BUN 68* 68*  CREATININE 3.42* 3.21*   Liver Panel No results for input(s): PROT, ALBUMIN, AST, ALT, ALKPHOS, BILITOT, BILIDIR, IBILI in the last 72 hours. Sedimentation Rate No results for input(s): ESRSEDRATE in the last 72 hours. C-Reactive Protein No results for input(s): CRP in the last 72 hours.  Microbiology:  Studies/Results: DG Foot Complete Right  Result Date: 08/19/2020 CLINICAL DATA:  Postop. EXAM: RIGHT FOOT COMPLETE - 3+ VIEW COMPARISON:  None. FINDINGS: Transmetatarsal amputation of the fifth ray. Resection margin is smooth. Expected postsurgical change in the adjacent soft tissues with air and edema. Overlying dressing in place. There are hammertoe deformity of the digits and mild degenerative change of the first metatarsal phalangeal joint. IMPRESSION: Transmetatarsal amputation of the fifth ray. No immediate postoperative complication. Electronically Signed   By: Keith Rake M.D.   On: 08/19/2020 19:21     Assessment/Plan: Diabetic foot infection- 5th toe  wound and acute  osteomyelitis- s/p ray resection Surgical wound looks great, likely a curative amputaion Culture so far neg Currently on linezolid and cefepime- DC cefepime Will DC home on linezolid for 7 days- follow up next week as OP   DM- last Hba1c was 10.4- not well controlled- currently on  insulin  CKD_ secondary to AIN from ampicillin and diabetets says he is the transplant list  Anemia due to CKD  Cardiomyopathy- ICD, on carvedilol CAD s/p CABG- on lipitor, ismo, amlodipine  H/o left TMA ?  Discussed the management with patient and his wife- Discussed side effects of linezolid and food and drinks to avoid

## 2020-08-22 LAB — CULTURE, BLOOD (ROUTINE X 2)
Culture: NO GROWTH
Culture: NO GROWTH
Special Requests: ADEQUATE

## 2020-08-22 LAB — SURGICAL PATHOLOGY

## 2020-08-23 LAB — AEROBIC/ANAEROBIC CULTURE W GRAM STAIN (SURGICAL/DEEP WOUND): Culture: NORMAL

## 2020-08-25 LAB — AEROBIC/ANAEROBIC CULTURE W GRAM STAIN (SURGICAL/DEEP WOUND): Culture: NO GROWTH

## 2020-08-27 ENCOUNTER — Ambulatory Visit: Payer: Medicare HMO | Attending: Infectious Diseases | Admitting: Infectious Diseases

## 2020-08-27 ENCOUNTER — Other Ambulatory Visit: Payer: Self-pay

## 2020-08-27 ENCOUNTER — Encounter: Payer: Self-pay | Admitting: Infectious Diseases

## 2020-08-27 VITALS — BP 167/100 | HR 85 | Temp 97.6°F | Resp 16 | Ht 73.0 in | Wt 268.0 lb

## 2020-08-27 DIAGNOSIS — Z79899 Other long term (current) drug therapy: Secondary | ICD-10-CM | POA: Diagnosis not present

## 2020-08-27 DIAGNOSIS — E1122 Type 2 diabetes mellitus with diabetic chronic kidney disease: Secondary | ICD-10-CM | POA: Diagnosis not present

## 2020-08-27 DIAGNOSIS — N183 Chronic kidney disease, stage 3 unspecified: Secondary | ICD-10-CM | POA: Diagnosis not present

## 2020-08-27 DIAGNOSIS — M879 Osteonecrosis, unspecified: Secondary | ICD-10-CM | POA: Diagnosis not present

## 2020-08-27 DIAGNOSIS — I739 Peripheral vascular disease, unspecified: Secondary | ICD-10-CM

## 2020-08-27 DIAGNOSIS — E1140 Type 2 diabetes mellitus with diabetic neuropathy, unspecified: Secondary | ICD-10-CM | POA: Diagnosis not present

## 2020-08-27 DIAGNOSIS — Z7982 Long term (current) use of aspirin: Secondary | ICD-10-CM | POA: Diagnosis not present

## 2020-08-27 DIAGNOSIS — I252 Old myocardial infarction: Secondary | ICD-10-CM | POA: Insufficient documentation

## 2020-08-27 DIAGNOSIS — I2510 Atherosclerotic heart disease of native coronary artery without angina pectoris: Secondary | ICD-10-CM | POA: Diagnosis not present

## 2020-08-27 DIAGNOSIS — E7800 Pure hypercholesterolemia, unspecified: Secondary | ICD-10-CM | POA: Insufficient documentation

## 2020-08-27 DIAGNOSIS — L089 Local infection of the skin and subcutaneous tissue, unspecified: Secondary | ICD-10-CM

## 2020-08-27 DIAGNOSIS — I429 Cardiomyopathy, unspecified: Secondary | ICD-10-CM | POA: Insufficient documentation

## 2020-08-27 DIAGNOSIS — I130 Hypertensive heart and chronic kidney disease with heart failure and stage 1 through stage 4 chronic kidney disease, or unspecified chronic kidney disease: Secondary | ICD-10-CM | POA: Insufficient documentation

## 2020-08-27 DIAGNOSIS — E119 Type 2 diabetes mellitus without complications: Secondary | ICD-10-CM

## 2020-08-27 DIAGNOSIS — Z7901 Long term (current) use of anticoagulants: Secondary | ICD-10-CM | POA: Insufficient documentation

## 2020-08-27 DIAGNOSIS — I5022 Chronic systolic (congestive) heart failure: Secondary | ICD-10-CM | POA: Insufficient documentation

## 2020-08-27 DIAGNOSIS — Z794 Long term (current) use of insulin: Secondary | ICD-10-CM | POA: Diagnosis not present

## 2020-08-27 DIAGNOSIS — I10 Essential (primary) hypertension: Secondary | ICD-10-CM

## 2020-08-27 DIAGNOSIS — E11628 Type 2 diabetes mellitus with other skin complications: Secondary | ICD-10-CM | POA: Diagnosis not present

## 2020-08-27 NOTE — Progress Notes (Signed)
NAME: [name] [name]  DOB: 08-Apr-1961  MRN: [id number]  Date/Time: 08/27/2020 12:25 PM  REQUESTING PROVIDER Subjective:  REASON FOR CONSULT:  Patient is here with his wife for follow-up. ? [name] [name] is a 59 y.o. male with a history of diabetes mellitus, CAD status post CABG, CHF cardiomyopathy, ICD, peripheral vascular disease, AIN due to beta-lactam, left TMA for diabetic foot ulcer was recently at [facility] between for right fifth toe ulceration and infection.  He underwent ray excision of the fifth toe on 08/19/2020.  He was sent home on p.o. linezolid to complete 7 days.  He is here for follow-up.  He is doing well.  Overall but last night he had an episode of sweating and flushed feeling and his blood sugar was 50 and he had a take a snack.  He did not sleep well because of that and this morning he had to take his wife for her appointment for breast cancer.  Patient is stressed today.  He also complains of having indigestion-like symptoms and burping for the past 2 days and thinks it could be the antibiotic. He does not complaining of any palpitation or sweating or shortness of breath or pain in the chest. On his discharge on 08/21/2020 his the cultures and pathology was still pending.   Past Medical History:  Diagnosis Date  . CAD (coronary artery disease)    a. 04/2014 CABG x 4 (LIMA->LAD, VG->Diag, VG->OM, VG->PDA).  . Chronic systolic CHF (congestive heart failure) ([place])    a. 07/2014 Echo: EF 30-35%.  . CKD (chronic kidney disease), stage III ([place])    a. 12/2015 Creat 1.8.  . Diabetic foot ulcers ([place])    a. left foot 2nd digit ant 5 th digit 05/03/14; b. 08/2014 s/p amputation of toes on L foot.  . Hypercholesterolemia    a. 12/2015 TC 116, TG 154, HDL 24, LDL 61-->Atorvastatin 40.  Marland Kitchen Hypertension   . Hypertensive heart disease   . Ischemic cardiomyopathy    a. 07/2013 Echo: EF 20%; b. 07/2014 Echo: EF 30-35%, diff HK, Gr1 DD, mildly dil LA, nl PASP.  Marland Kitchen Myocardial infarction ([place])   .  Neuropathy in diabetes ([place])   . Open wound    foot  . Orthostatic hypotension   . Peripheral vascular disease ([place])   . Type II diabetes mellitus ([place])    a. 12/2015 HbA1c = 13.9.    Past Surgical History:  Procedure Laterality Date  . ACHILLES TENDON SURGERY Right 01/22/2017   Procedure: ACHILLES LENGTHENING/KIDNER/TAL/Teno achilles lengthening;  Surgeon: Samara Deist, DPM;  Location: [facility];  Service: Podiatry;  Laterality: Right;  . AMPUTATION Right 08/19/2020   Procedure: AMPUTATION RAY-Right Partial 5th Ray;  Surgeon: Caroline More, DPM;  Location: [facility];  Service: Podiatry;  Laterality: Right;  . CARDIAC CATHETERIZATION     03/2014  . CATARACT EXTRACTION    . CORONARY ARTERY BYPASS GRAFT N/A 05/07/2014   Procedure: CORONARY ARTERY BYPASS GRAFTING (CABG);  Surgeon: Gaye Pollack, MD;  Location: [place];  Service: Open Heart Surgery;  Laterality: N/A;  Times 4 using left internal mammary artery and endoscopically harvested right saphenous vein  . DIALYSIS/PERMA CATHETER INSERTION N/A 08/04/2019   Procedure: DIALYSIS/PERMA CATHETER INSERTION;  Surgeon: Katha Cabal, MD;  Location: [place] [facility];  Service: Cardiovascular;  Laterality: N/A;  . DIALYSIS/PERMA CATHETER REMOVAL N/A 08/08/2019   Procedure: DIALYSIS/PERMA CATHETER REMOVAL;  Surgeon: Katha Cabal, MD;  Location: [place] [facility];  Service: Cardiovascular;  Laterality: N/A;  . ICD IMPLANT N/A 10/04/2019   Procedure: ICD IMPLANT;  Surgeon: Deboraha Sprang, MD;  Location: [place] [facility];  Service: Cardiovascular;  Laterality: N/A;  . INTRAOPERATIVE TRANSESOPHAGEAL ECHOCARDIOGRAM N/A 05/07/2014   Procedure: INTRAOPERATIVE TRANSESOPHAGEAL ECHOCARDIOGRAM;  Surgeon: Gaye Pollack, MD;  Location: [place] [facility];  Service: Open Heart Surgery;  Laterality: N/A;  . left foot osteomyelitis and wound after surgery    . TOE AMPUTATION     Left 3 and 4 toes  . TONSILECTOMY/ADENOIDECTOMY WITH MYRINGOTOMY      Social  History   Socioeconomic History  . Marital status: Married    Spouse name: Not on file  . Number of children: Not on file  . Years of education: Not on file  . Highest education level: Not on file  Occupational History  . Not on file  Tobacco Use  . Smoking status: Never Smoker  . Smokeless tobacco: Never Used  Vaping Use  . Vaping Use: Never used  Substance and Sexual Activity  . Alcohol use: No  . Drug use: No  . Sexual activity: Not on file  Other Topics Concern  . Not on file  Social History Narrative  . Not on file   Social Determinants of Health   Financial Resource Strain:   . Difficulty of Paying Living Expenses: Not on file  Food Insecurity:   . Worried About Charity fundraiser in the Last Year: Not on file  . Ran Out of Food in the Last Year: Not on file  Transportation Needs:   . Lack of Transportation (Medical): Not on file  . Lack of Transportation (Non-Medical): Not on file  Physical Activity:   . Days of Exercise per Week: Not on file  . Minutes of Exercise per Session: Not on file  Stress:   . Feeling of Stress : Not on file  Social Connections:   . Frequency of Communication with Friends and Family: Not on file  . Frequency of Social Gatherings with Friends and Family: Not on file  . Attends Religious Services: Not on file  . Active Member of Clubs or Organizations: Not on file  . Attends Archivist Meetings: Not on file  . Marital Status: Not on file  Intimate Partner Violence:   . Fear of Current or Ex-Partner: Not on file  . Emotionally Abused: Not on file  . Physically Abused: Not on file  . Sexually Abused: Not on file    Family History  Problem Relation Age of Onset  . Heart disease Father        CABG in his 70's  . Diabetes type II Other   . Kidney disease Other   . Hypertension Other   . Ovarian cancer Mother    Allergies  Allergen Reactions  . Unasyn [Ampicillin-Sulbactam Sodium] Anaphylaxis    Allergic interstitial  nephritis to Unasyn/Zosyn in 2014. Doesn't believe he has an allergy to PCN as he has taken it previously, according to wife.  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 Doesn't believe he has an allergy to PCN as he has taken it previously, according to wife.    ? Current Outpatient Medications  Medication Sig Dispense Refill  . acetaminophen (TYLENOL) 325 MG tablet Take by mouth every 6 (six) hours as needed for mild pain or moderate pain.    Marland Kitchen allopurinol (ZYLOPRIM) 100 MG tablet Take 100 mg by mouth daily.    Marland Kitchen  amLODipine (NORVASC) 2.5 MG tablet Take 2.5 mg by mouth daily.    Marland Kitchen aspirin EC 81 MG tablet Take 81 mg by mouth daily.    Marland Kitchen atorvastatin (LIPITOR) 40 MG tablet TAKE 1 TABLET (40 MG TOTAL) BY MOUTH AT BEDTIME. 90 tablet 0  . carvedilol (COREG) 6.25 MG tablet TAKE 1 TABLET TWICE DAILY 180 tablet 3  . cephALEXin (KEFLEX) 500 MG capsule Take 500 mg by mouth 3 (three) times daily.    . Cyanocobalamin (VITAMIN B 12 PO) Take 1,000 mcg by mouth at bedtime.     . ferrous sulfate 325 (65 FE) MG tablet Take 325 mg by mouth daily.    Marland Kitchen glimepiride (AMARYL) 4 MG tablet Take 4 mg by mouth in the morning and at bedtime.    . insulin aspart (NOVOLOG) 100 UNIT/ML injection Inject 0-9 Units into the skin 3 (three) times daily with meals. (Patient taking differently: Inject 0-9 Units into the skin 3 (three) times daily with meals. Sliding Scale) 10 mL 11  . insulin NPH Human (NOVOLIN N) 100 UNIT/ML injection Inject 20 Units into the skin in the morning and at bedtime.    Marland Kitchen linezolid (ZYVOX) 600 MG tablet Take 1 tablet (600 mg total) by mouth every 12 (twelve) hours for 7 days. 14 tablet 0  . Multiple Vitamin (MULTIVITAMIN) capsule Take 1 capsule by mouth daily.    . nitroGLYCERIN (NITROSTAT) 0.4 MG SL tablet Place 1 tablet (0.4 mg total) under the tongue every 5 (five) minutes as needed for chest pain. 30 tablet 1  . sodium  bicarbonate 650 MG tablet Take 1,300 mg by mouth 2 (two) times daily.     Marland Kitchen torsemide (DEMADEX) 20 MG tablet Take 40 mg by mouth daily.    Marland Kitchen zolpidem (AMBIEN) 5 MG tablet Take 5 mg by mouth at bedtime as needed for sleep.    . isosorbide mononitrate (IMDUR) 60 MG 24 hr tablet Take 1 tablet (60 mg total) by mouth daily. 90 tablet 3   No current facility-administered medications for this visit.     Abtx:  Anti-infectives (From admission, onward)   None      REVIEW OF SYSTEMS:  Const: negative fever, negative chills, negative weight loss Eyes: negative diplopia or visual changes, negative eye pain ENT: negative coryza, negative sore throat Resp: negative cough, hemoptysis, dyspnea Cards: negative for chest pain, palpitations, lower extremity edema GU: negative for frequency, dysuria and hematuria GI: Complains of indigestion, acid reflux-like symptoms. Skin: negative for rash and pruritus Heme: negative for easy bruising and gum/nose bleeding MS: negative for myalgias, arthralgias, back pain and muscle weakness Neurolo:negative for headaches, dizziness, vertigo, memory problems  Psych: negative for feelings of anxiety, depression  Endocrine: Has diabetes Allergy/Immunology-beta-lactam caused AIN.: Objective:  VITALS:  BP (!) 167/100   Pulse 85   Temp 97.6 F (36.4 C)   Resp 16   Ht 6\' 1"  (1.854 m)   Wt 268 lb (121.6 kg)   SpO2 98%   BMI 35.36 kg/m   REPEAT BP 136/84 PHYSICAL EXAM:  General: Alert, cooperative, no distress, appears stated age.  Head: Normocephalic, without obvious abnormality, atraumatic. Eyes: Conjunctivae clear, anicteric sclerae. Pupils are equal ENT Nares normal. No drainage or sinus tenderness. Lips, mucosa, and tongue normal. No Thrush Neck: Supple, symmetrical, no adenopathy, thyroid: non tender no carotid bruit and no JVD. Back: No CVA tenderness. Lungs: Clear to auscultation bilaterally. No Wheezing or Rhonchi. No rales. Heart: Regular rate and  rhythm, no murmur,  rub or gallop. ICD site fine Sternal scar Abdomen: Soft, non-tender,not distended. Bowel sounds normal. No masses Extremities: Surgical site on the right foot looks very clean.  No erythema or discharge.    hes or lesions. Or bruising Lymph: Cervical, supraclavicular normal. Neurologic: Grossly non-focal Pertinent Labs Lab Results CBC    Component Value Date/Time   WBC 6.7 08/21/2020 0415   RBC 3.21 (L) 08/21/2020 0415   HGB 10.0 (L) 08/21/2020 0415   HGB 6.6 (L) 08/28/2014 0507   HCT 30.6 (L) 08/21/2020 0415   HCT 20.3 (L) 08/28/2014 0507   PLT 132 (L) 08/21/2020 0415   PLT 225 08/28/2014 0507   MCV 95.3 08/21/2020 0415   MCV 89 08/28/2014 0507   MCH 31.2 08/21/2020 0415   MCHC 32.7 08/21/2020 0415   RDW 13.1 08/21/2020 0415   RDW 13.3 08/28/2014 0507   LYMPHSABS 1.8 08/16/2020 1945   LYMPHSABS 1.6 08/28/2014 0507   MONOABS 0.9 08/16/2020 1945   MONOABS 0.9 08/28/2014 0507   EOSABS 0.2 08/16/2020 1945   EOSABS 0.2 08/28/2014 0507   BASOSABS 0.1 08/16/2020 1945   BASOSABS 0.0 08/28/2014 0507    CMP Latest Ref Rng & Units 08/21/2020 08/20/2020 08/19/2020  Glucose 70 - 99 mg/dL 170(H) 131(H) 141(H)  BUN 6 - 20 mg/dL 68(H) 68(H) 81(H)  Creatinine 0.61 - 1.24 mg/dL 3.21(H) 3.42(H) 4.21(H)  Sodium 135 - 145 mmol/L 137 140 138  Potassium 3.5 - 5.1 mmol/L 5.0 4.8 4.7  Chloride 98 - 111 mmol/L 108 109 105  CO2 22 - 32 mmol/L 22 23 22   Calcium 8.9 - 10.3 mg/dL 8.1(L) 8.2(L) 8.8(L)  Total Protein 6.5 - 8.1 g/dL - - -  Total Bilirubin 0.3 - 1.2 mg/dL - - -  Alkaline Phos 38 - 126 U/L - - -  AST 15 - 41 U/L - - -  ALT 0 - 44 U/L - - -      Microbiology: Recent Results (from the past 240 hour(s))  Aerobic/Anaerobic Culture (surgical/deep wound)     Status: None   Collection Time: 08/18/20  9:38 AM   Specimen: Wound  Result Value Ref Range Status   Specimen Description   Final    WOUND Performed at [facility], [address]  [place], [place] [address]    Special Requests   Final    NONE Performed at [facility], [address] [place], [place] [address]    Gram Stain   Final    RARE WBC PRESENT,BOTH PMN AND MONONUCLEAR NO ORGANISMS SEEN    Culture   Final    RARE NORMAL SKIN FLORA NO GROUP A STREP (S.PYOGENES) ISOLATED NO STAPHYLOCOCCUS AUREUS ISOLATED NO ANAEROBES ISOLATED Performed at [facility], [place] [address] [place], [place] [address]    Report Status 08/23/2020 FINAL  Final  MRSA PCR Screening     Status: None   Collection Time: 08/19/20  6:15 AM   Specimen: Nasal Mucosa; Nasopharyngeal  Result Value Ref Range Status   MRSA by PCR NEGATIVE NEGATIVE Final    Comment:        The GeneXpert MRSA Assay (FDA approved for NASAL specimens only), is one component of a comprehensive MRSA colonization surveillance program. It is not intended to diagnose MRSA infection nor to guide or monitor treatment for MRSA infections. Performed at [facility], [address] [place], [place] [address]   Aerobic/Anaerobic Culture (surgical/deep wound)     Status: None   Collection  Time: 08/19/20  5:18 PM   Specimen: PATH Benign ortho; Tissue  Result Value Ref Range Status   Specimen Description   Final    BONE RIGHT 5TH BONE CULTURE Performed at [facility], [address], [place], [place] [address]    Special Requests   Final    NONE Performed at [facility], [address] [place], [place] [address]    Gram Stain   Final    FEW WBC PRESENT,BOTH PMN AND MONONUCLEAR RARE GRAM POSITIVE COCCI    Culture   Final    No growth aerobically or anaerobically. Performed at [facility], [place] [address], [place], [place] [address]    Report Status 08/25/2020 FINAL  Final   08/19/2020 wound culture no growth aerobically or anaerobically.  Gram stain had rare gram-positive cocci  Pathology of the bone shows digit with osteonecrosis.  Negative for active  inflammation.  Necrotic bone is focally present at the inked resection margin.  Soft tissue margins are viable. Impression/Recommendation ? Impression/recommendation Diabetic foot disease with osteonecrosis of the fifth toe on the right foot in a patient with underlying peripheral artery disease and diabetic neuropathy..  He was treated like infection.  Underwent ray excision of the fifth toe.  Bone pathology showed no active inflammation.  No acute osteomyelitis Culture was negative. Patient has completed 6 days of linezolid.  As it is giving him some heartburn would stop it as there is no need for further antibiotics.  ?  Hypertension on presentation.  BP was 160/100 but on repeat it was 134/ 86.  ?  Heartburn/GERD-like symptoms. Patient thinks it could be related to the antibiotic linezolid. He has history of CAD/cardiomyopathy and has AICD. He did not have any palpitations or shortness of breath or sweating. He said he was going to try PPI and if no improvement will contact his PCP. If he has  symptoms i like shortness of breath or chest tightness or palpitations I asked him to  go to the emergency department and contact his cardiologist. Discussed with his wife who was with him in the visit.  Diabetes mellitus. On insulin Last night he had an episode of hypoglycemia with blood sugar reading 50. He was symptomatic and took snacks. He is doing fine this morning.  He has a follow-up appointment with Dr. Luana Shu on Friday. He is discharged from my clinic. ___________________________________________________ Discussed with patient, requesting provider Note:  This document was prepared using Dragon voice recognition software and may include unintentional dictation errors.

## 2020-08-27 NOTE — Patient Instructions (Addendum)
You are here for follow up of the rt foot infection- you had 5th toe ray excison- the culture was negative and the bone pathology showed no infection but only necrosis  You can stop linezolid ( day 6) as it seems to be giving your indigestion . If say you have heart burn and if it does not get better with Over the counter medicine like PPI please let your cardiac or PCP now.  Your BP was 160/100  And then was 134/86, HR 85 Lungs clear Foot looks good

## 2020-08-30 DIAGNOSIS — L97512 Non-pressure chronic ulcer of other part of right foot with fat layer exposed: Secondary | ICD-10-CM | POA: Diagnosis not present

## 2020-08-30 DIAGNOSIS — E118 Type 2 diabetes mellitus with unspecified complications: Secondary | ICD-10-CM | POA: Diagnosis not present

## 2020-09-11 DIAGNOSIS — E1129 Type 2 diabetes mellitus with other diabetic kidney complication: Secondary | ICD-10-CM | POA: Diagnosis not present

## 2020-09-11 DIAGNOSIS — N2581 Secondary hyperparathyroidism of renal origin: Secondary | ICD-10-CM | POA: Diagnosis not present

## 2020-09-11 DIAGNOSIS — N184 Chronic kidney disease, stage 4 (severe): Secondary | ICD-10-CM | POA: Diagnosis not present

## 2020-09-16 DIAGNOSIS — I951 Orthostatic hypotension: Secondary | ICD-10-CM | POA: Diagnosis not present

## 2020-09-16 DIAGNOSIS — R808 Other proteinuria: Secondary | ICD-10-CM | POA: Diagnosis not present

## 2020-09-16 DIAGNOSIS — I10 Essential (primary) hypertension: Secondary | ICD-10-CM | POA: Diagnosis not present

## 2020-09-16 DIAGNOSIS — N184 Chronic kidney disease, stage 4 (severe): Secondary | ICD-10-CM | POA: Diagnosis not present

## 2020-09-16 DIAGNOSIS — E1129 Type 2 diabetes mellitus with other diabetic kidney complication: Secondary | ICD-10-CM | POA: Diagnosis not present

## 2020-09-16 DIAGNOSIS — N2581 Secondary hyperparathyroidism of renal origin: Secondary | ICD-10-CM | POA: Diagnosis not present

## 2020-09-16 DIAGNOSIS — E790 Hyperuricemia without signs of inflammatory arthritis and tophaceous disease: Secondary | ICD-10-CM | POA: Diagnosis not present

## 2020-09-16 DIAGNOSIS — E875 Hyperkalemia: Secondary | ICD-10-CM | POA: Diagnosis not present

## 2020-09-25 DIAGNOSIS — E113513 Type 2 diabetes mellitus with proliferative diabetic retinopathy with macular edema, bilateral: Secondary | ICD-10-CM | POA: Diagnosis not present

## 2020-09-30 NOTE — Progress Notes (Signed)
Date:  10/01/2020   ID:  [name] [name], DOB [birth year], MRN [id number]  Patient Location:  [place] [id number]   Provider location:   [facility], [place] office  PCP:  Tracie Harrier, MD  Cardiologist:  Patsy Baltimore  Chief Complaint  Patient presents with   Other    6 month follow up. Meds reviewed verbally with patient.     History of Present Illness:    [name] [name] is a 59 y.o. male  past medical history of diabetes, poorly controlled with poor diet, finances limiting medications hyperlipidemia,  coronary artery disease with bypass surgery x4 at the end of June 2015 with Dr. Cyndia Bent, orthostatic hypotension  EF at first <20%, Hospitalization November 2019 for acute on chronic systolic CHF ejection fraction 30 to 35%, nonsustained VT on monitors  Syncope,  EF 30 to 35% in 06/2019 ICD for VT who presents for routine follow-up Of his coronary artery disease, history of bypass  Weight up 10 pounds Eating 2 meals a day Not exercising  In sept 2021, was walking 2 miles Less since foot surgery Foot healed Started walking again  Stress, wife with cancer No SOB,  Playing golf tomorrow  No palpitations, no ICD shock  Takes torsemide 40 alternating with 20   Sees nephrology, Q6 weeks CR 3.44, BUN 62  Labs reviewed HBA1C 8.7 HCT 43 Total chol 128, LDL 73  EKG personally reviewed by myself on todays visit NSR old anterior MI, pvc, old inferior Mi, rate 72 bpm  Past medical history reviewed  hospitalization October 2020, syncope Hypoxia on arrival in the setting of CHF  concern for sustained VT on telemetry EP unable to evaluate patient on an inpatient basis -LifeVest has been placed  Ejection fraction 30 to 35% previous MI, known ischemic   Had significant diuresis during his hospital course -Recommend torsemide 40 daily 3 pound weight gain with take torsemide 40 twice daily  Medication changes  madehydralazine up to 50mg  3 times daily, isosorbide 30 twice daily, continue carvedilol  On todays visit, feels good Sugars high, 174 and higher, usually 200s levimer 20 units yesterday  Weight down to 240 pounds  changed diet Cr 3.25 up to 4.29, repeat down to 3.5 with nephrology  With fluid out, arrhythmia/palpitations are better since he has been home  Vitals today BP today 123/78, pulse 78 bpm  Echo 06/29/2019 EF of 30-35%.  Echo  10/05/2018  EF of 30 to 35%, diffuse hypokinesis   nuclear stress test on 10/07/2018 that showed a large region of fixed inferior and inferior lateral wall perfusion defect consistent with previous MI , EF was estimated at 36%.   Event Monitor Normal sinus rhythm avg HR of 81 bpm. ---8 Ventricular Tachycardia runs occurred, the run with the fastest interval lasting 4 beats with a max rate of 200 bpm,  the longest lasting 17 beats with an avg rate of 120 bpm.  ----7 Supraventricular Tachycardia runs occurred,  the run with the fastest interval lasting 5 beats with a max rate of 148 bpm,  the longest lasting 13 beats with an avg rate of 110 bpm.   history in October 2014 of MRSA, requiring amputation of several of his toes, severe renal dysfunction at that time felt secondary to antibiotics with subsequent improvement of his renal function. Prior echocardiogram September 2014 showing ejection fraction less than 20% ejection fraction was 30-35% by echo 08/02/2014  cardiac catheterization  may 2015 showing 70% left main disease, 70% diagonal #2 disease, 80% proximal circumflex disease, 80% mid circumflex disease, 99% proximal RCA disease, 99% PL branch disease, ejection fraction 40%   Past Medical History:  Diagnosis Date   CAD (coronary artery disease)    a. 04/2014 CABG x 4 (LIMA->LAD, VG->Diag, VG->OM, VG->PDA).   Chronic systolic CHF (congestive heart failure) ([place])    a. 07/2014 Echo: EF 30-35%.   CKD (chronic kidney disease), stage  III ([place])    a. 12/2015 Creat 1.8.   Diabetic foot ulcers ([place])    a. left foot 2nd digit ant 5 th digit 05/03/14; b. 08/2014 s/p amputation of toes on L foot.   Hypercholesterolemia    a. 12/2015 TC 116, TG 154, HDL 24, LDL 61-->Atorvastatin 40.   Hypertension    Hypertensive heart disease    Ischemic cardiomyopathy    a. 07/2013 Echo: EF 20%; b. 07/2014 Echo: EF 30-35%, diff HK, Gr1 DD, mildly dil LA, nl PASP.   Myocardial infarction ([place])    Neuropathy in diabetes [facility])    Open wound    foot   Orthostatic hypotension    Peripheral vascular disease ([place])    Type II diabetes mellitus ([place])    a. 12/2015 HbA1c = 13.9.   Past Surgical History:  Procedure Laterality Date   ACHILLES TENDON SURGERY Right 01/22/2017   Procedure: ACHILLES LENGTHENING/KIDNER/TAL/Teno achilles lengthening;  Surgeon: Samara Deist, DPM;  Location: [facility];  Service: Podiatry;  Laterality: Right;   AMPUTATION Right 08/19/2020   Procedure: AMPUTATION RAY-Right Partial 5th Ray;  Surgeon: Caroline More, DPM;  Location: [facility];  Service: Podiatry;  Laterality: Right;   CARDIAC CATHETERIZATION     03/2014   CATARACT EXTRACTION     CORONARY ARTERY BYPASS GRAFT N/A 05/07/2014   Procedure: CORONARY ARTERY BYPASS GRAFTING (CABG);  Surgeon: Gaye Pollack, MD;  Location: [place];  Service: Open Heart Surgery;  Laterality: N/A;  Times 4 using left internal mammary artery and endoscopically harvested right saphenous vein   DIALYSIS/PERMA CATHETER INSERTION N/A 08/04/2019   Procedure: DIALYSIS/PERMA CATHETER INSERTION;  Surgeon: Katha Cabal, MD;  Location: [place] CV [facility];  Service: Cardiovascular;  Laterality: N/A;   DIALYSIS/PERMA CATHETER REMOVAL N/A 08/08/2019   Procedure: DIALYSIS/PERMA CATHETER REMOVAL;  Surgeon: Katha Cabal, MD;  Location: [place] CV [facility];  Service: Cardiovascular;  Laterality: N/A;   ICD IMPLANT N/A 10/04/2019   Procedure: ICD IMPLANT;  Surgeon: Deboraha Sprang, MD;   Location: [place] [facility];  Service: Cardiovascular;  Laterality: N/A;   INTRAOPERATIVE TRANSESOPHAGEAL ECHOCARDIOGRAM N/A 05/07/2014   Procedure: INTRAOPERATIVE TRANSESOPHAGEAL ECHOCARDIOGRAM;  Surgeon: Gaye Pollack, MD;  Location: [place] OR;  Service: Open Heart Surgery;  Laterality: N/A;   left foot osteomyelitis and wound after surgery     TOE AMPUTATION     Left 3 and 4 toes   TONSILECTOMY/ADENOIDECTOMY WITH MYRINGOTOMY        Allergies:   Unasyn [ampicillin-sulbactam sodium] and Zosyn [piperacillin sod-tazobactam so]   Social History   Tobacco Use   Smoking status: Never Smoker   Smokeless tobacco: Never Used  Vaping Use   Vaping Use: Never used  Substance Use Topics   Alcohol use: No   Drug use: No     Current Outpatient Medications on File Prior to Visit  Medication Sig Dispense Refill   acetaminophen (TYLENOL) 325 MG tablet Take by mouth every 6 (six) hours as needed for mild pain or moderate pain.  allopurinol (ZYLOPRIM) 100 MG tablet Take 100 mg by mouth daily.     aspirin EC 81 MG tablet Take 81 mg by mouth daily.     atorvastatin (LIPITOR) 40 MG tablet TAKE 1 TABLET (40 MG TOTAL) BY MOUTH AT BEDTIME. 90 tablet 0   carvedilol (COREG) 6.25 MG tablet TAKE 1 TABLET TWICE DAILY 180 tablet 3   Cyanocobalamin (VITAMIN B 12 PO) Take 1,000 mcg by mouth at bedtime.      ferrous sulfate 325 (65 FE) MG tablet Take 325 mg by mouth daily.     glimepiride (AMARYL) 4 MG tablet Take 4 mg by mouth in the morning and at bedtime.     insulin aspart (NOVOLOG) 100 UNIT/ML injection Inject 0-9 Units into the skin 3 (three) times daily with meals. (Patient taking differently: Inject 0-9 Units into the skin 3 (three) times daily with meals. Sliding Scale) 10 mL 11   insulin NPH Human (NOVOLIN N) 100 UNIT/ML injection Inject 20 Units into the skin in the morning and at bedtime.     Multiple Vitamin (MULTIVITAMIN) capsule Take 1 capsule by mouth daily.     sodium  bicarbonate 650 MG tablet Take 1,300 mg by mouth 2 (two) times daily.      torsemide (DEMADEX) 20 MG tablet Take 40 mg by mouth daily.     No current facility-administered medications on file prior to visit.     Family Hx: The patient's family history includes Diabetes type II in an other family member; Heart disease in his father; Hypertension in an other family member; Kidney disease in an other family member; Ovarian cancer in his mother.  ROS:   Please see the history of present illness.    Review of Systems  Constitutional: Negative.        Weight gain  HENT: Negative.   Respiratory: Negative.   Cardiovascular: Negative.   Gastrointestinal: Negative.   Musculoskeletal: Negative.   Neurological: Negative.   Psychiatric/Behavioral: Negative.   All other systems reviewed and are negative.    Labs/Other Tests and Data Reviewed:    Recent Labs: 08/16/2020: ALT 17 08/17/2020: B Natriuretic Peptide 187.8 08/21/2020: BUN 68; Creatinine, Ser 3.21; Hemoglobin 10.0; Platelets 132; Potassium 5.0; Sodium 137   Recent Lipid Panel No results found for: CHOL, TRIG, HDL, CHOLHDL, LDLCALC, LDLDIRECT  Wt Readings from Last 3 Encounters:  10/01/20 273 lb (123.8 kg)  08/27/20 268 lb (121.6 kg)  08/16/20 270 lb (122.5 kg)     Exam:    Vital Signs: Vital signs may also be detailed in the HPI BP 120/68 (BP Location: Left Arm, Patient Position: Sitting, Cuff Size: Large)    Pulse 72    Ht 6\' 1"  (1.854 m)    Wt 273 lb (123.8 kg)    SpO2 97%    BMI 36.02 kg/m   Constitutional:  oriented to person, place, and time. No distress.  HENT:  Head: Grossly normal Eyes:  no discharge. No scleral icterus.  Neck: No JVD, no carotid bruits  Cardiovascular: Regular rate and rhythm, no murmurs appreciated Pulmonary/Chest: Clear to auscultation bilaterally, no wheezes or rails Abdominal: Soft.  no distension.  no tenderness.  Musculoskeletal: Normal range of motion Neurological:  normal muscle tone.  Coordination normal. No atrophy Skin: Skin warm and dry Psychiatric: normal affect, pleasant  ASSESSMENT & PLAN:    Problem List Items Addressed This Visit      Cardiology Problems   Chronic systolic heart failure (HCC) (Chronic)   Relevant  Medications   amLODipine (NORVASC) 5 MG tablet   nitroGLYCERIN (NITROSTAT) 0.4 MG SL tablet   isosorbide mononitrate (IMDUR) 60 MG 24 hr tablet   NSVT (nonsustained ventricular tachycardia) (HCC)   Relevant Medications   amLODipine (NORVASC) 5 MG tablet   nitroGLYCERIN (NITROSTAT) 0.4 MG SL tablet   isosorbide mononitrate (IMDUR) 60 MG 24 hr tablet   CAD (coronary artery disease)   Relevant Medications   amLODipine (NORVASC) 5 MG tablet   nitroGLYCERIN (NITROSTAT) 0.4 MG SL tablet   isosorbide mononitrate (IMDUR) 60 MG 24 hr tablet   Other Relevant Orders   EKG 12-Lead   Ischemic cardiomyopathy - Primary   Relevant Medications   amLODipine (NORVASC) 5 MG tablet   nitroGLYCERIN (NITROSTAT) 0.4 MG SL tablet   isosorbide mononitrate (IMDUR) 60 MG 24 hr tablet   Other Relevant Orders   EKG 12-Lead   Orthostatic hypotension   Relevant Medications   amLODipine (NORVASC) 5 MG tablet   nitroGLYCERIN (NITROSTAT) 0.4 MG SL tablet   isosorbide mononitrate (IMDUR) 60 MG 24 hr tablet    Other Visit Diagnoses    ICD (implantable cardioverter-defibrillator) in place       CKD (chronic kidney disease), stage IV (HCC)         Ischemic cardiomyopathy coronary disease, CABG, large region of infarct  Long history nonsustained VT Has ICD, downloads reviewed, no significant arrhythmia noted -Reports he is on isosorbide 60 daily, Coreg, amlodipine 5 daily (this was held previously in the setting of cardiomyopathy),  Unable to take ACE inhibitor, ARB, Entresto secondary to renal failure -Given he is asymptomatic, feels well, blood pressure well controlled, will continue current medications  Poor control diabetes type 2 with complications Working with  endocrine A1c 8.7 Weight running higher, 10 pounds, recommend he restart his walking program  Ventricular tachycardia Has ICD, followed by EP No recent events   Total encounter time more than 25 minutes  Greater than 50% was spent in counseling and coordination of care with the patient   Signed, [name] Rogue, MD  [facility] [address], [place], [place] [address]

## 2020-10-01 ENCOUNTER — Ambulatory Visit (INDEPENDENT_AMBULATORY_CARE_PROVIDER_SITE_OTHER): Payer: Medicare HMO | Admitting: Cardiovascular Disease

## 2020-10-01 ENCOUNTER — Encounter: Payer: Self-pay | Admitting: Cardiovascular Disease

## 2020-10-01 ENCOUNTER — Other Ambulatory Visit: Payer: Self-pay

## 2020-10-01 VITALS — BP 120/68 | HR 72 | Ht 73.0 in | Wt 273.0 lb

## 2020-10-01 DIAGNOSIS — I5022 Chronic systolic (congestive) heart failure: Secondary | ICD-10-CM | POA: Diagnosis not present

## 2020-10-01 DIAGNOSIS — I25118 Atherosclerotic heart disease of native coronary artery with other forms of angina pectoris: Secondary | ICD-10-CM | POA: Diagnosis not present

## 2020-10-01 DIAGNOSIS — I4729 Other ventricular tachycardia: Secondary | ICD-10-CM

## 2020-10-01 DIAGNOSIS — N184 Chronic kidney disease, stage 4 (severe): Secondary | ICD-10-CM | POA: Diagnosis not present

## 2020-10-01 DIAGNOSIS — I255 Ischemic cardiomyopathy: Secondary | ICD-10-CM

## 2020-10-01 DIAGNOSIS — Z95810 Presence of automatic (implantable) cardiac defibrillator: Secondary | ICD-10-CM

## 2020-10-01 DIAGNOSIS — I2510 Atherosclerotic heart disease of native coronary artery without angina pectoris: Secondary | ICD-10-CM

## 2020-10-01 DIAGNOSIS — I472 Ventricular tachycardia: Secondary | ICD-10-CM | POA: Diagnosis not present

## 2020-10-01 DIAGNOSIS — I951 Orthostatic hypotension: Secondary | ICD-10-CM | POA: Diagnosis not present

## 2020-10-01 MED ORDER — NITROGLYCERIN 0.4 MG SL SUBL: 0.4000 mg | SUBLINGUAL_TABLET | SUBLINGUAL | 1 refills | Status: AC | PRN

## 2020-10-01 MED ORDER — AMLODIPINE BESYLATE 5 MG PO TABS
5.0000 mg | ORAL_TABLET | Freq: Every day | ORAL | 3 refills | Status: DC
Start: 2020-10-01 — End: 2021-07-29

## 2020-10-01 MED ORDER — ISOSORBIDE MONONITRATE ER 60 MG PO TB24
60.0000 mg | ORAL_TABLET | Freq: Every day | ORAL | 3 refills | Status: DC
Start: 2020-10-01 — End: 2021-07-29

## 2020-10-01 NOTE — Patient Instructions (Signed)
Medication Instructions:  No changes  If you need a refill on your cardiac medications before your next appointment, please call your pharmacy.    Lab work: No new labs needed   If you have labs (blood work) drawn today and your tests are completely normal, you will receive your results only by: . MyChart Message (if you have MyChart) OR . A paper copy in the mail If you have any lab test that is abnormal or we need to change your treatment, we will call you to review the results.   Testing/Procedures: No new testing needed   Follow-Up: At [facility], you and your health needs are our priority.  As part of our continuing mission to provide you with exceptional heart care, we have created designated Provider Care Teams.  These Care Teams include your primary Cardiologist (physician) and Advanced Practice Providers (APPs -  Physician Assistants and Nurse Practitioners) who all work together to provide you with the care you need, when you need it.  . You will need a follow up appointment in 12 months  . Providers on your designated Care Team:   . Christopher Berge, NP . Ryan Dunn, PA-C . Jacquelyn Visser, PA-C  Any Other Special Instructions Will Be Listed Below (If Applicable).  COVID-19 Vaccine Information can be found at: [email or web address] For questions related to vaccine distribution or appointments, please email [email or web address] or call [phone].     

## 2020-10-03 ENCOUNTER — Ambulatory Visit (INDEPENDENT_AMBULATORY_CARE_PROVIDER_SITE_OTHER): Payer: Medicare HMO

## 2020-10-03 DIAGNOSIS — I255 Ischemic cardiomyopathy: Secondary | ICD-10-CM

## 2020-10-05 LAB — CUP PACEART REMOTE DEVICE CHECK
Battery Remaining Longevity: 100 mo
Battery Remaining Percentage: 89 %
Battery Voltage: 2.99 V
Brady Statistic RV Percent Paced: 0 %
Date Time Interrogation Session: 20211119145632
HighPow Impedance: 78 Ohm
Implantable Lead Implant Date: 20201118
Implantable Lead Location: 753860
Implantable Pulse Generator Implant Date: 20201118
Lead Channel Impedance Value: 390 Ohm
Lead Channel Pacing Threshold Amplitude: 0.5 V
Lead Channel Pacing Threshold Pulse Width: 0.3 ms
Lead Channel Sensing Intrinsic Amplitude: 11.4 mV
Lead Channel Setting Pacing Amplitude: 2.5 V
Lead Channel Setting Pacing Pulse Width: 0.3 ms
Lead Channel Setting Sensing Sensitivity: 0.5 mV
Pulse Gen Serial Number: 111013097

## 2020-10-07 NOTE — Progress Notes (Signed)
Remote ICD transmission.   

## 2020-10-08 DIAGNOSIS — E786 Lipoprotein deficiency: Secondary | ICD-10-CM | POA: Diagnosis not present

## 2020-10-08 DIAGNOSIS — E1122 Type 2 diabetes mellitus with diabetic chronic kidney disease: Secondary | ICD-10-CM | POA: Diagnosis not present

## 2020-10-08 DIAGNOSIS — I255 Ischemic cardiomyopathy: Secondary | ICD-10-CM | POA: Diagnosis not present

## 2020-10-08 DIAGNOSIS — I10 Essential (primary) hypertension: Secondary | ICD-10-CM | POA: Diagnosis not present

## 2020-10-08 DIAGNOSIS — Z794 Long term (current) use of insulin: Secondary | ICD-10-CM | POA: Diagnosis not present

## 2020-10-08 DIAGNOSIS — Z951 Presence of aortocoronary bypass graft: Secondary | ICD-10-CM | POA: Diagnosis not present

## 2020-10-08 DIAGNOSIS — E875 Hyperkalemia: Secondary | ICD-10-CM | POA: Diagnosis not present

## 2020-10-08 DIAGNOSIS — Z125 Encounter for screening for malignant neoplasm of prostate: Secondary | ICD-10-CM | POA: Diagnosis not present

## 2020-10-08 DIAGNOSIS — N184 Chronic kidney disease, stage 4 (severe): Secondary | ICD-10-CM | POA: Diagnosis not present

## 2020-10-15 DIAGNOSIS — E663 Overweight: Secondary | ICD-10-CM | POA: Diagnosis not present

## 2020-10-15 DIAGNOSIS — I120 Hypertensive chronic kidney disease with stage 5 chronic kidney disease or end stage renal disease: Secondary | ICD-10-CM | POA: Diagnosis not present

## 2020-10-15 DIAGNOSIS — Z89422 Acquired absence of other left toe(s): Secondary | ICD-10-CM | POA: Diagnosis not present

## 2020-10-15 DIAGNOSIS — E1142 Type 2 diabetes mellitus with diabetic polyneuropathy: Secondary | ICD-10-CM | POA: Diagnosis not present

## 2020-10-15 DIAGNOSIS — N185 Chronic kidney disease, stage 5: Secondary | ICD-10-CM | POA: Diagnosis not present

## 2020-10-15 DIAGNOSIS — Z89421 Acquired absence of other right toe(s): Secondary | ICD-10-CM | POA: Diagnosis not present

## 2020-10-15 DIAGNOSIS — E1122 Type 2 diabetes mellitus with diabetic chronic kidney disease: Secondary | ICD-10-CM | POA: Diagnosis not present

## 2020-10-15 DIAGNOSIS — Z794 Long term (current) use of insulin: Secondary | ICD-10-CM | POA: Diagnosis not present

## 2020-10-15 DIAGNOSIS — I2510 Atherosclerotic heart disease of native coronary artery without angina pectoris: Secondary | ICD-10-CM | POA: Diagnosis not present

## 2020-10-22 DIAGNOSIS — E875 Hyperkalemia: Secondary | ICD-10-CM | POA: Diagnosis not present

## 2020-10-23 DIAGNOSIS — E113513 Type 2 diabetes mellitus with proliferative diabetic retinopathy with macular edema, bilateral: Secondary | ICD-10-CM | POA: Diagnosis not present

## 2020-11-13 ENCOUNTER — Other Ambulatory Visit: Payer: Self-pay | Admitting: Cardiovascular Disease

## 2020-12-04 DIAGNOSIS — E113513 Type 2 diabetes mellitus with proliferative diabetic retinopathy with macular edema, bilateral: Secondary | ICD-10-CM | POA: Diagnosis not present

## 2020-12-04 DIAGNOSIS — N184 Chronic kidney disease, stage 4 (severe): Secondary | ICD-10-CM | POA: Diagnosis not present

## 2020-12-09 DIAGNOSIS — E875 Hyperkalemia: Secondary | ICD-10-CM | POA: Diagnosis not present

## 2020-12-09 DIAGNOSIS — N184 Chronic kidney disease, stage 4 (severe): Secondary | ICD-10-CM | POA: Diagnosis not present

## 2020-12-09 DIAGNOSIS — I951 Orthostatic hypotension: Secondary | ICD-10-CM | POA: Diagnosis not present

## 2020-12-09 DIAGNOSIS — N2581 Secondary hyperparathyroidism of renal origin: Secondary | ICD-10-CM | POA: Diagnosis not present

## 2020-12-09 DIAGNOSIS — I10 Essential (primary) hypertension: Secondary | ICD-10-CM | POA: Diagnosis not present

## 2020-12-09 DIAGNOSIS — E790 Hyperuricemia without signs of inflammatory arthritis and tophaceous disease: Secondary | ICD-10-CM | POA: Diagnosis not present

## 2020-12-09 DIAGNOSIS — E1129 Type 2 diabetes mellitus with other diabetic kidney complication: Secondary | ICD-10-CM | POA: Diagnosis not present

## 2020-12-09 DIAGNOSIS — R808 Other proteinuria: Secondary | ICD-10-CM | POA: Diagnosis not present

## 2020-12-13 DIAGNOSIS — E1142 Type 2 diabetes mellitus with diabetic polyneuropathy: Secondary | ICD-10-CM | POA: Diagnosis not present

## 2020-12-13 DIAGNOSIS — B351 Tinea unguium: Secondary | ICD-10-CM | POA: Diagnosis not present

## 2020-12-30 ENCOUNTER — Other Ambulatory Visit: Payer: Self-pay | Admitting: Internal Medicine

## 2021-01-01 DIAGNOSIS — E113513 Type 2 diabetes mellitus with proliferative diabetic retinopathy with macular edema, bilateral: Secondary | ICD-10-CM | POA: Diagnosis not present

## 2021-01-02 ENCOUNTER — Ambulatory Visit (INDEPENDENT_AMBULATORY_CARE_PROVIDER_SITE_OTHER): Payer: Medicare HMO

## 2021-01-02 DIAGNOSIS — I255 Ischemic cardiomyopathy: Secondary | ICD-10-CM

## 2021-01-03 LAB — CUP PACEART REMOTE DEVICE CHECK
Battery Remaining Longevity: 98 mo
Battery Remaining Percentage: 88 %
Battery Voltage: 2.99 V
Brady Statistic RV Percent Paced: 1 %
Date Time Interrogation Session: 20220217010006
HighPow Impedance: 66 Ohm
Implantable Lead Implant Date: 20201118
Implantable Lead Location: 753860
Implantable Pulse Generator Implant Date: 20201118
Lead Channel Impedance Value: 390 Ohm
Lead Channel Pacing Threshold Amplitude: 0.5 V
Lead Channel Pacing Threshold Pulse Width: 0.3 ms
Lead Channel Sensing Intrinsic Amplitude: 11.4 mV
Lead Channel Setting Pacing Amplitude: 2.5 V
Lead Channel Setting Pacing Pulse Width: 0.3 ms
Lead Channel Setting Sensing Sensitivity: 0.5 mV
Pulse Gen Serial Number: 111013097

## 2021-01-08 NOTE — Progress Notes (Signed)
Remote ICD transmission.   

## 2021-02-10 DIAGNOSIS — Z794 Long term (current) use of insulin: Secondary | ICD-10-CM | POA: Diagnosis not present

## 2021-02-10 DIAGNOSIS — E1122 Type 2 diabetes mellitus with diabetic chronic kidney disease: Secondary | ICD-10-CM | POA: Diagnosis not present

## 2021-02-10 DIAGNOSIS — Z79899 Other long term (current) drug therapy: Secondary | ICD-10-CM | POA: Diagnosis not present

## 2021-02-10 DIAGNOSIS — E7800 Pure hypercholesterolemia, unspecified: Secondary | ICD-10-CM | POA: Diagnosis not present

## 2021-02-10 DIAGNOSIS — N184 Chronic kidney disease, stage 4 (severe): Secondary | ICD-10-CM | POA: Diagnosis not present

## 2021-02-10 DIAGNOSIS — I25810 Atherosclerosis of coronary artery bypass graft(s) without angina pectoris: Secondary | ICD-10-CM | POA: Diagnosis not present

## 2021-02-10 DIAGNOSIS — E1165 Type 2 diabetes mellitus with hyperglycemia: Secondary | ICD-10-CM | POA: Diagnosis not present

## 2021-02-12 DIAGNOSIS — E113513 Type 2 diabetes mellitus with proliferative diabetic retinopathy with macular edema, bilateral: Secondary | ICD-10-CM | POA: Diagnosis not present

## 2021-02-17 DIAGNOSIS — Z951 Presence of aortocoronary bypass graft: Secondary | ICD-10-CM | POA: Diagnosis not present

## 2021-02-17 DIAGNOSIS — Z0000 Encounter for general adult medical examination without abnormal findings: Secondary | ICD-10-CM | POA: Diagnosis not present

## 2021-02-17 DIAGNOSIS — N185 Chronic kidney disease, stage 5: Secondary | ICD-10-CM | POA: Diagnosis not present

## 2021-02-17 DIAGNOSIS — Z794 Long term (current) use of insulin: Secondary | ICD-10-CM | POA: Diagnosis not present

## 2021-02-17 DIAGNOSIS — E1122 Type 2 diabetes mellitus with diabetic chronic kidney disease: Secondary | ICD-10-CM | POA: Diagnosis not present

## 2021-02-17 DIAGNOSIS — I2510 Atherosclerotic heart disease of native coronary artery without angina pectoris: Secondary | ICD-10-CM | POA: Diagnosis not present

## 2021-02-17 DIAGNOSIS — D649 Anemia, unspecified: Secondary | ICD-10-CM | POA: Diagnosis not present

## 2021-02-17 DIAGNOSIS — Z89422 Acquired absence of other left toe(s): Secondary | ICD-10-CM | POA: Diagnosis not present

## 2021-03-11 DIAGNOSIS — Z6835 Body mass index (BMI) 35.0-35.9, adult: Secondary | ICD-10-CM | POA: Diagnosis not present

## 2021-03-11 DIAGNOSIS — Z1211 Encounter for screening for malignant neoplasm of colon: Secondary | ICD-10-CM | POA: Diagnosis not present

## 2021-03-11 DIAGNOSIS — Z95810 Presence of automatic (implantable) cardiac defibrillator: Secondary | ICD-10-CM | POA: Diagnosis not present

## 2021-03-11 DIAGNOSIS — I25810 Atherosclerosis of coronary artery bypass graft(s) without angina pectoris: Secondary | ICD-10-CM | POA: Diagnosis not present

## 2021-03-11 DIAGNOSIS — K5900 Constipation, unspecified: Secondary | ICD-10-CM | POA: Diagnosis not present

## 2021-03-11 DIAGNOSIS — Z951 Presence of aortocoronary bypass graft: Secondary | ICD-10-CM | POA: Diagnosis not present

## 2021-03-11 DIAGNOSIS — I5022 Chronic systolic (congestive) heart failure: Secondary | ICD-10-CM | POA: Diagnosis not present

## 2021-03-19 DIAGNOSIS — E790 Hyperuricemia without signs of inflammatory arthritis and tophaceous disease: Secondary | ICD-10-CM | POA: Diagnosis not present

## 2021-03-19 DIAGNOSIS — N184 Chronic kidney disease, stage 4 (severe): Secondary | ICD-10-CM | POA: Diagnosis not present

## 2021-03-19 DIAGNOSIS — E113513 Type 2 diabetes mellitus with proliferative diabetic retinopathy with macular edema, bilateral: Secondary | ICD-10-CM | POA: Diagnosis not present

## 2021-03-24 DIAGNOSIS — E1129 Type 2 diabetes mellitus with other diabetic kidney complication: Secondary | ICD-10-CM | POA: Diagnosis not present

## 2021-03-24 DIAGNOSIS — N2581 Secondary hyperparathyroidism of renal origin: Secondary | ICD-10-CM | POA: Diagnosis not present

## 2021-03-24 DIAGNOSIS — R808 Other proteinuria: Secondary | ICD-10-CM | POA: Diagnosis not present

## 2021-03-24 DIAGNOSIS — N184 Chronic kidney disease, stage 4 (severe): Secondary | ICD-10-CM | POA: Diagnosis not present

## 2021-03-24 DIAGNOSIS — I10 Essential (primary) hypertension: Secondary | ICD-10-CM | POA: Diagnosis not present

## 2021-03-24 DIAGNOSIS — I951 Orthostatic hypotension: Secondary | ICD-10-CM | POA: Diagnosis not present

## 2021-04-03 ENCOUNTER — Ambulatory Visit (INDEPENDENT_AMBULATORY_CARE_PROVIDER_SITE_OTHER): Payer: Medicare HMO

## 2021-04-03 DIAGNOSIS — I255 Ischemic cardiomyopathy: Secondary | ICD-10-CM | POA: Diagnosis not present

## 2021-04-03 LAB — CUP PACEART REMOTE DEVICE CHECK
Battery Remaining Longevity: 106 mo
Battery Remaining Percentage: 85 %
Battery Voltage: 2.99 V
Brady Statistic RV Percent Paced: 1 %
Date Time Interrogation Session: 20220519041755
HighPow Impedance: 69 Ohm
Implantable Lead Implant Date: 20201118
Implantable Lead Location: 753860
Implantable Pulse Generator Implant Date: 20201118
Lead Channel Impedance Value: 390 Ohm
Lead Channel Pacing Threshold Amplitude: 0.5 V
Lead Channel Pacing Threshold Pulse Width: 0.3 ms
Lead Channel Sensing Intrinsic Amplitude: 11.4 mV
Lead Channel Setting Pacing Amplitude: 2.5 V
Lead Channel Setting Pacing Pulse Width: 0.3 ms
Lead Channel Setting Sensing Sensitivity: 0.5 mV
Pulse Gen Serial Number: 111013097

## 2021-04-25 NOTE — Progress Notes (Signed)
Remote ICD transmission.   

## 2021-04-30 DIAGNOSIS — E113513 Type 2 diabetes mellitus with proliferative diabetic retinopathy with macular edema, bilateral: Secondary | ICD-10-CM | POA: Diagnosis not present

## 2021-05-26 ENCOUNTER — Other Ambulatory Visit: Payer: Self-pay | Admitting: Cardiovascular Disease

## 2021-06-18 DIAGNOSIS — E113513 Type 2 diabetes mellitus with proliferative diabetic retinopathy with macular edema, bilateral: Secondary | ICD-10-CM | POA: Diagnosis not present

## 2021-06-19 DIAGNOSIS — I739 Peripheral vascular disease, unspecified: Secondary | ICD-10-CM | POA: Diagnosis not present

## 2021-06-19 DIAGNOSIS — Z951 Presence of aortocoronary bypass graft: Secondary | ICD-10-CM | POA: Diagnosis not present

## 2021-06-19 DIAGNOSIS — I255 Ischemic cardiomyopathy: Secondary | ICD-10-CM | POA: Diagnosis not present

## 2021-06-19 DIAGNOSIS — Z794 Long term (current) use of insulin: Secondary | ICD-10-CM | POA: Diagnosis not present

## 2021-06-19 DIAGNOSIS — N184 Chronic kidney disease, stage 4 (severe): Secondary | ICD-10-CM | POA: Diagnosis not present

## 2021-06-19 DIAGNOSIS — E7800 Pure hypercholesterolemia, unspecified: Secondary | ICD-10-CM | POA: Diagnosis not present

## 2021-06-19 DIAGNOSIS — I25810 Atherosclerosis of coronary artery bypass graft(s) without angina pectoris: Secondary | ICD-10-CM | POA: Diagnosis not present

## 2021-06-19 DIAGNOSIS — E1122 Type 2 diabetes mellitus with diabetic chronic kidney disease: Secondary | ICD-10-CM | POA: Diagnosis not present

## 2021-06-19 DIAGNOSIS — Z6835 Body mass index (BMI) 35.0-35.9, adult: Secondary | ICD-10-CM | POA: Diagnosis not present

## 2021-06-19 DIAGNOSIS — R8290 Unspecified abnormal findings in urine: Secondary | ICD-10-CM | POA: Diagnosis not present

## 2021-06-27 DIAGNOSIS — R635 Abnormal weight gain: Secondary | ICD-10-CM | POA: Diagnosis not present

## 2021-06-27 DIAGNOSIS — Z23 Encounter for immunization: Secondary | ICD-10-CM | POA: Diagnosis not present

## 2021-06-27 DIAGNOSIS — I129 Hypertensive chronic kidney disease with stage 1 through stage 4 chronic kidney disease, or unspecified chronic kidney disease: Secondary | ICD-10-CM | POA: Diagnosis not present

## 2021-06-27 DIAGNOSIS — E1122 Type 2 diabetes mellitus with diabetic chronic kidney disease: Secondary | ICD-10-CM | POA: Diagnosis not present

## 2021-06-27 DIAGNOSIS — Z794 Long term (current) use of insulin: Secondary | ICD-10-CM | POA: Diagnosis not present

## 2021-06-27 DIAGNOSIS — Z95810 Presence of automatic (implantable) cardiac defibrillator: Secondary | ICD-10-CM | POA: Diagnosis not present

## 2021-06-27 DIAGNOSIS — I25810 Atherosclerosis of coronary artery bypass graft(s) without angina pectoris: Secondary | ICD-10-CM | POA: Diagnosis not present

## 2021-06-27 DIAGNOSIS — N185 Chronic kidney disease, stage 5: Secondary | ICD-10-CM | POA: Diagnosis not present

## 2021-06-27 DIAGNOSIS — Z6837 Body mass index (BMI) 37.0-37.9, adult: Secondary | ICD-10-CM | POA: Diagnosis not present

## 2021-07-02 DIAGNOSIS — N184 Chronic kidney disease, stage 4 (severe): Secondary | ICD-10-CM | POA: Diagnosis not present

## 2021-07-02 DIAGNOSIS — E1129 Type 2 diabetes mellitus with other diabetic kidney complication: Secondary | ICD-10-CM | POA: Diagnosis not present

## 2021-07-03 ENCOUNTER — Ambulatory Visit (INDEPENDENT_AMBULATORY_CARE_PROVIDER_SITE_OTHER): Payer: Medicare HMO

## 2021-07-03 DIAGNOSIS — I255 Ischemic cardiomyopathy: Secondary | ICD-10-CM

## 2021-07-03 LAB — CUP PACEART REMOTE DEVICE CHECK
Battery Remaining Longevity: 103 mo
Battery Remaining Percentage: 84 %
Battery Voltage: 2.99 V
Brady Statistic RV Percent Paced: 1 %
Date Time Interrogation Session: 20220818021209
HighPow Impedance: 68 Ohm
Implantable Lead Implant Date: 20201118
Implantable Lead Location: 753860
Implantable Pulse Generator Implant Date: 20201118
Lead Channel Impedance Value: 380 Ohm
Lead Channel Pacing Threshold Amplitude: 0.5 V
Lead Channel Pacing Threshold Pulse Width: 0.3 ms
Lead Channel Sensing Intrinsic Amplitude: 11.4 mV
Lead Channel Setting Pacing Amplitude: 2.5 V
Lead Channel Setting Pacing Pulse Width: 0.3 ms
Lead Channel Setting Sensing Sensitivity: 0.5 mV
Pulse Gen Serial Number: 111013097

## 2021-07-08 ENCOUNTER — Encounter: Payer: Self-pay | Admitting: Internal Medicine

## 2021-07-09 ENCOUNTER — Ambulatory Visit: Payer: Medicare HMO | Admitting: Certified Registered Nurse Anesthetist

## 2021-07-09 ENCOUNTER — Encounter: Payer: Self-pay | Admitting: Internal Medicine

## 2021-07-09 ENCOUNTER — Ambulatory Visit
Admission: RE | Admit: 2021-07-09 | Discharge: 2021-07-09 | Disposition: A | Discharge status: Home / Self Care | Payer: Medicare HMO | Attending: Internal Medicine | Admitting: Internal Medicine

## 2021-07-09 ENCOUNTER — Encounter
Admission: RE | Disposition: A | Discharge status: Home / Self Care | Payer: Self-pay | Source: Home / Self Care | Attending: Internal Medicine

## 2021-07-09 DIAGNOSIS — Z880 Allergy status to penicillin: Secondary | ICD-10-CM | POA: Insufficient documentation

## 2021-07-09 DIAGNOSIS — K640 First degree hemorrhoids: Secondary | ICD-10-CM | POA: Insufficient documentation

## 2021-07-09 DIAGNOSIS — N184 Chronic kidney disease, stage 4 (severe): Secondary | ICD-10-CM | POA: Diagnosis not present

## 2021-07-09 DIAGNOSIS — Z7982 Long term (current) use of aspirin: Secondary | ICD-10-CM | POA: Diagnosis not present

## 2021-07-09 DIAGNOSIS — I130 Hypertensive heart and chronic kidney disease with heart failure and stage 1 through stage 4 chronic kidney disease, or unspecified chronic kidney disease: Secondary | ICD-10-CM | POA: Insufficient documentation

## 2021-07-09 DIAGNOSIS — Z951 Presence of aortocoronary bypass graft: Secondary | ICD-10-CM | POA: Insufficient documentation

## 2021-07-09 DIAGNOSIS — I5022 Chronic systolic (congestive) heart failure: Secondary | ICD-10-CM | POA: Diagnosis not present

## 2021-07-09 DIAGNOSIS — Z79899 Other long term (current) drug therapy: Secondary | ICD-10-CM | POA: Insufficient documentation

## 2021-07-09 DIAGNOSIS — Z794 Long term (current) use of insulin: Secondary | ICD-10-CM | POA: Insufficient documentation

## 2021-07-09 DIAGNOSIS — E1122 Type 2 diabetes mellitus with diabetic chronic kidney disease: Secondary | ICD-10-CM | POA: Insufficient documentation

## 2021-07-09 DIAGNOSIS — D120 Benign neoplasm of cecum: Secondary | ICD-10-CM | POA: Diagnosis not present

## 2021-07-09 DIAGNOSIS — Z888 Allergy status to other drugs, medicaments and biological substances status: Secondary | ICD-10-CM | POA: Diagnosis not present

## 2021-07-09 DIAGNOSIS — K635 Polyp of colon: Secondary | ICD-10-CM | POA: Diagnosis not present

## 2021-07-09 DIAGNOSIS — Z7682 Awaiting organ transplant status: Secondary | ICD-10-CM | POA: Insufficient documentation

## 2021-07-09 DIAGNOSIS — K5730 Diverticulosis of large intestine without perforation or abscess without bleeding: Secondary | ICD-10-CM | POA: Insufficient documentation

## 2021-07-09 DIAGNOSIS — Z1211 Encounter for screening for malignant neoplasm of colon: Secondary | ICD-10-CM | POA: Diagnosis not present

## 2021-07-09 DIAGNOSIS — I2510 Atherosclerotic heart disease of native coronary artery without angina pectoris: Secondary | ICD-10-CM | POA: Diagnosis not present

## 2021-07-09 DIAGNOSIS — K648 Other hemorrhoids: Secondary | ICD-10-CM | POA: Diagnosis not present

## 2021-07-09 DIAGNOSIS — E1151 Type 2 diabetes mellitus with diabetic peripheral angiopathy without gangrene: Secondary | ICD-10-CM | POA: Insufficient documentation

## 2021-07-09 DIAGNOSIS — E7800 Pure hypercholesterolemia, unspecified: Secondary | ICD-10-CM | POA: Diagnosis not present

## 2021-07-09 HISTORY — DX: Cardiac arrhythmia, unspecified: I49.9

## 2021-07-09 HISTORY — PX: COLONOSCOPY WITH PROPOFOL: SHX5780

## 2021-07-09 HISTORY — DX: Anemia, unspecified: D64.9

## 2021-07-09 HISTORY — DX: Peripheral vascular disease, unspecified: I73.9

## 2021-07-09 LAB — GLUCOSE, CAPILLARY: Glucose-Capillary: 143 mg/dL — ABNORMAL HIGH (ref 70–99)

## 2021-07-09 SURGERY — COLONOSCOPY WITH PROPOFOL
Anesthesia: General

## 2021-07-09 MED ORDER — PROPOFOL 10 MG/ML IV BOLUS
INTRAVENOUS | Status: DC | PRN
  Administered 2021-07-09: 60 mg via INTRAVENOUS
  Administered 2021-07-09: 10 mg via INTRAVENOUS

## 2021-07-09 MED ORDER — PHENYLEPHRINE HCL (PRESSORS) 10 MG/ML IV SOLN
INTRAVENOUS | Status: AC
  Filled 2021-07-09: qty 1

## 2021-07-09 MED ORDER — PROPOFOL 500 MG/50ML IV EMUL
INTRAVENOUS | Status: DC | PRN
  Administered 2021-07-09: 120 ug/kg/min via INTRAVENOUS

## 2021-07-09 MED ORDER — PROPOFOL 500 MG/50ML IV EMUL
INTRAVENOUS | Status: AC
  Filled 2021-07-09: qty 200

## 2021-07-09 MED ORDER — SODIUM CHLORIDE 0.9 % IV SOLN
INTRAVENOUS | Status: DC
  Administered 2021-07-09: 1000 mL via INTRAVENOUS

## 2021-07-09 MED ORDER — GLYCOPYRROLATE 0.2 MG/ML IJ SOLN
INTRAMUSCULAR | Status: AC
  Filled 2021-07-09: qty 1

## 2021-07-09 MED ORDER — LIDOCAINE HCL (PF) 2 % IJ SOLN
INTRAMUSCULAR | Status: AC
  Filled 2021-07-09: qty 5

## 2021-07-09 NOTE — Anesthesia Preprocedure Evaluation (Signed)
Anesthesia Evaluation  Patient identified by MRN, date of birth, ID band Patient awake    Reviewed: Allergy & Precautions, H&P , NPO status , Patient's Chart, lab work & pertinent test results, reviewed documented beta blocker date and time   History of Anesthesia Complications Negative for: history of anesthetic complications  Airway Mallampati: III  TM Distance: >3 FB Neck ROM: full   Comment: Large neck Dental  (+) Chipped   Pulmonary neg pulmonary ROS, Sleep apnea: never been tested. , neg COPD,    breath sounds clear to auscultation       Cardiovascular hypertension, (-) angina+ CAD, + Past MI, + CABG, + Peripheral Vascular Disease and +CHF  + dysrhythmias (reports his ICD was placed due to suspected arrhythmia)  Rhythm:regular Rate:Normal  Echo 06/30/19: 1. The left ventricle has moderate-severely reduced systolic function,  with an ejection fraction of 30-35%. The cavity size was normal. There is  mildly increased left ventricular wall thickness. Left ventricular  diastolic Doppler parameters are  consistent with pseudonormalization. Left ventricular diffuse hypokinesis.  2. The right ventricle has normal systolic function. The cavity was  normal. There is no increase in right ventricular wall thickness. Unable  to estimate RVSP.  3. Left atrial size was moderately dilated.    Neuro/Psych negative neurological ROS  negative psych ROS   GI/Hepatic negative GI ROS, Neg liver ROS,   Endo/Other  diabetes, Well Controlled, Type 2, Insulin Dependent, Oral Hypoglycemic Agents  Renal/GU CRFRenal disease (CKD)     Musculoskeletal  (+) Arthritis ,   Abdominal   Peds  Hematology  (+) anemia ,   Anesthesia Other Findings Osteomyelitis  Past Medical History: No date: CAD (coronary artery disease)     Comment:  a. 04/2014 CABG x 4 (LIMA->LAD, VG->Diag, VG->OM,               VG->PDA). No date: Chronic systolic CHF  (congestive heart failure) ([place])     Comment:  a. 07/2014 Echo: EF 30-35%. No date: CKD (chronic kidney disease), stage III ([place])     Comment:  a. 12/2015 Creat 1.8. No date: Diabetic foot ulcers ([place])     Comment:  a. left foot 2nd digit ant 5 th digit 05/03/14; b.               08/2014 s/p amputation of toes on L foot. No date: Hypercholesterolemia     Comment:  a. 12/2015 TC 116, TG 154, HDL 24, LDL 61-->Atorvastatin               40. No date: Hypertension No date: Hypertensive heart disease No date: Ischemic cardiomyopathy     Comment:  a. 07/2013 Echo: EF 20%; b. 07/2014 Echo: EF 30-35%, diff               HK, Gr1 DD, mildly dil LA, nl PASP. No date: Myocardial infarction [facility]) No date: Neuropathy in diabetes ([place]) No date: Open wound     Comment:  foot No date: Orthostatic hypotension No date: Peripheral vascular disease ([place]) No date: Type II diabetes mellitus ([place])     Comment:  a. 12/2015 HbA1c = 13.9.  Past Surgical History: 01/22/2017: ACHILLES TENDON SURGERY; Right     Comment:  Procedure: ACHILLES LENGTHENING/KIDNER/TAL/Teno achilles              lengthening;  Surgeon: Samara Deist, DPM;  Location:               [facility] OR[facility];  Service:  Podiatry;  Laterality: Right; No date: CARDIAC CATHETERIZATION     Comment:  03/2014 No date: CATARACT EXTRACTION 05/07/2014: CORONARY ARTERY BYPASS GRAFT; N/A     Comment:  Procedure: CORONARY ARTERY BYPASS GRAFTING (CABG);                Surgeon: Gaye Pollack, MD;  Location: [place];  Service:               Open Heart Surgery;  Laterality: N/A;  Times 4 using left              internal mammary artery and endoscopically harvested               right saphenous vein 08/04/2019: DIALYSIS/PERMA CATHETER INSERTION; N/A     Comment:  Procedure: DIALYSIS/PERMA CATHETER INSERTION;  Surgeon:               Katha Cabal, MD;  Location: [place] CV [facility];               Service: Cardiovascular;  Laterality: N/A; 08/08/2019: DIALYSIS/PERMA CATHETER  REMOVAL; N/A     Comment:  Procedure: DIALYSIS/PERMA CATHETER REMOVAL;  Surgeon:               Katha Cabal, MD;  Location: [place] CV LAB;               Service: Cardiovascular;  Laterality: N/A; 10/04/2019: ICD IMPLANT; N/A     Comment:  Procedure: ICD IMPLANT;  Surgeon: Deboraha Sprang, MD;                Location: [place] CV [facility];  Service: Cardiovascular;                Laterality: N/A; 05/07/2014: INTRAOPERATIVE TRANSESOPHAGEAL ECHOCARDIOGRAM; N/A     Comment:  Procedure: INTRAOPERATIVE TRANSESOPHAGEAL               ECHOCARDIOGRAM;  Surgeon: Gaye Pollack, MD;  Location:               [place] OR;  Service: Open Heart Surgery;  Laterality: N/A; No date: left foot osteomyelitis and wound after surgery No date: TOE AMPUTATION     Comment:  Left 3 and 4 toes No date: TONSILECTOMY/ADENOIDECTOMY WITH MYRINGOTOMY  BMI    Body Mass Index: 35.62 kg/m      Reproductive/Obstetrics negative OB ROS                             Anesthesia Physical  Anesthesia Plan  ASA: III  Anesthesia Plan: General LMA and General   Post-op Pain Management:    Induction: Intravenous  PONV Risk Score and Plan: TIVA and Treatment may vary due to age or medical condition  Airway Management Planned: Simple Face Mask  Additional Equipment:   Intra-op Plan:   Post-operative Plan:   Informed Consent: I have reviewed the patients History and Physical, chart, labs and discussed the procedure including the risks, benefits and alternatives for the proposed anesthesia with the patient or authorized representative who has indicated his/her understanding and acceptance.     Dental Advisory Given  Plan Discussed with: Anesthesiologist, CRNA and Surgeon  Anesthesia Plan Comments:         Anesthesia Quick Evaluation

## 2021-07-09 NOTE — Transfer of Care (Signed)
Immediate Anesthesia Transfer of Care Note  Patient: [name] [name]  Procedure(s) Performed: COLONOSCOPY WITH PROPOFOL  Patient Location: PACU  Anesthesia Type:General  Level of Consciousness: drowsy  Airway & Oxygen Therapy: Patient Spontanous Breathing and Patient connected to face mask oxygen  Post-op Assessment: Report given to RN and Post -op Vital signs reviewed and stable  Post vital signs: Reviewed and stable  Last Vitals:  Vitals Value Taken Time  BP 118/70 07/09/21 0849  Temp    Pulse 70 07/09/21 0851  Resp 14 07/09/21 0851  SpO2 97 % 07/09/21 0851  Vitals shown include unvalidated device data.  Last Pain:  Vitals:   07/09/21 0757  TempSrc: Temporal  PainSc: 0-No pain         Complications: No notable events documented.

## 2021-07-09 NOTE — H&P (Signed)
Outpatient short stay form Pre-procedure 07/09/2021 8:18 AM Zoey Gilkeson K. Alice Reichert, M.D.  Primary Physician: Vishwanath HandeMD  Reason for visit:  Colon cancer screening  History of present illness: 60 year old male with history of stage IV chronic kidney disease presents for pretransplant evaluation and screening colonoscopy.  Patient also has history of coronary artery disease status post four-vessel bypass surgery.  He is on no blood thinners.Patient denies change in bowel habits, rectal bleeding, weight loss or abdominal pain.      Current Facility-Administered Medications:    0.9 %  sodium chloride infusion, , Intravenous, Continuous, [place], Benay Pike, MD, Last Rate: 20 mL/hr at 07/09/21 0817, 1,000 mL at 07/09/21 0817  Medications Prior to Admission  Medication Sig Dispense Refill Last Dose   allopurinol (ZYLOPRIM) 100 MG tablet Take 100 mg by mouth daily.   Past Week   amLODipine (NORVASC) 5 MG tablet Take 1 tablet (5 mg total) by mouth daily. 90 tablet 3 07/09/2021 at 0600   aspirin EC 81 MG tablet Take 81 mg by mouth daily.   Past Week   atorvastatin (LIPITOR) 40 MG tablet TAKE 1 TABLET AT BEDTIME 90 tablet 1 Past Week   carvedilol (COREG) 6.25 MG tablet TAKE 1 TABLET TWICE DAILY 180 tablet 2 07/09/2021 at 0600   Cyanocobalamin (VITAMIN B 12 PO) Take 1,000 mcg by mouth at bedtime.    Past Week   ferrous sulfate 325 (65 FE) MG tablet Take 325 mg by mouth daily.   Past Week   HYDROcodone-acetaminophen (NORCO/VICODIN) 5-325 MG tablet Take 1 tablet by mouth daily as needed for moderate pain.   Past Week   insulin aspart (NOVOLOG) 100 UNIT/ML injection Inject 0-9 Units into the skin 3 (three) times daily with meals. (Patient taking differently: Inject 0-9 Units into the skin 3 (three) times daily with meals. Sliding Scale) 10 mL 11 Past Week   insulin NPH Human (NOVOLIN N) 100 UNIT/ML injection Inject 20 Units into the skin in the morning and at bedtime.   Past Week   isosorbide mononitrate  (IMDUR) 60 MG 24 hr tablet Take 1 tablet (60 mg total) by mouth daily. 90 tablet 3 07/09/2021 at 0600   Multiple Vitamin (MULTIVITAMIN) capsule Take 1 capsule by mouth daily.   Past Week   sodium bicarbonate 650 MG tablet Take 1,300 mg by mouth 2 (two) times daily.    07/08/2021   torsemide (DEMADEX) 20 MG tablet Take 40 mg by mouth daily.   Past Week   acetaminophen (TYLENOL) 325 MG tablet Take by mouth every 6 (six) hours as needed for mild pain or moderate pain.    at prn   nitroGLYCERIN (NITROSTAT) 0.4 MG SL tablet Place 1 tablet (0.4 mg total) under the tongue every 5 (five) minutes as needed for chest pain. 25 tablet 1  at prn     Allergies  Allergen Reactions   Unasyn [Ampicillin-Sulbactam Sodium] Anaphylaxis    Allergic interstitial nephritis to Unasyn/Zosyn in 2014. Doesn't believe he has an allergy to PCN as he has taken it previously, according to wife.   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 Doesn't believe he has an allergy to PCN as he has taken it previously, according to wife.     Past Medical History:  Diagnosis Date   Anemia    CAD (coronary artery disease)    a. 04/2014 CABG x 4 (LIMA->LAD, VG->Diag, VG->OM, VG->PDA).   Chronic systolic CHF (congestive heart failure) (  [place])    a. 07/2014 Echo: EF 30-35%.   CKD (chronic kidney disease), stage III ([place])    a. 12/2015 Creat 1.8.   Diabetic foot ulcers ([place])    a. left foot 2nd digit ant 5 th digit 05/03/14; b. 08/2014 s/p amputation of toes on L foot.   Dysrhythmia    Hypercholesterolemia    a. 12/2015 TC 116, TG 154, HDL 24, LDL 61-->Atorvastatin 40.   Hypertension    Hypertensive heart disease    Ischemic cardiomyopathy    a. 07/2013 Echo: EF 20%; b. 07/2014 Echo: EF 30-35%, diff HK, Gr1 DD, mildly dil LA, nl PASP.   Myocardial infarction ([place])    Neuropathy in diabetes ([place])    Open wound    foot   Orthostatic hypotension    PAD (peripheral  artery disease) ([place])    Peripheral vascular disease ([facility])    Type II diabetes mellitus ([place])    a. 12/2015 HbA1c = 13.9.    Review of systems:  Otherwise negative.    Physical Exam  Gen: Alert, oriented. Appears stated age.  HEENT: [place]/AT. PERRLA. Lungs: CTA, no wheezes. CV: RR nl S1, S2. Abd: soft, benign, no masses. BS+ Ext: No edema. Pulses 2+    Planned procedures: Proceed with colonoscopy. The patient understands the nature of the planned procedure, indications, risks, alternatives and potential complications including but not limited to bleeding, infection, perforation, damage to internal organs and possible oversedation/side effects from anesthesia. The patient agrees and gives consent to proceed.  Please refer to procedure notes for findings, recommendations and patient disposition/instructions.     Tasman Zapata K. Alice Reichert, M.D. Gastroenterology 07/09/2021  8:18 AM

## 2021-07-09 NOTE — Anesthesia Procedure Notes (Signed)
Date/Time: 07/09/2021 8:37 AM Performed by: Demetrius Charity, CRNA Pre-anesthesia Checklist: Patient identified, Patient being monitored, Timeout performed, Emergency Drugs available and Suction available Patient Re-evaluated:Patient Re-evaluated prior to induction Oxygen Delivery Method: Nasal cannula Preoxygenation: Pre-oxygenation with 100% oxygen Placement Confirmation: CO2 detector and positive ETCO2

## 2021-07-09 NOTE — Op Note (Signed)
[facility] Patient Name: [name] [name] Procedure Date: 07/09/2021 7:01 AM MRN: [id number] Account #: [id number] Date of Birth: [birth year] Admit Type: Outpatient Age: 60 Room: [facility] ENDO ROOM 2 Gender: Male Note Status: Finalized Procedure:             Colonoscopy Indications:           Screening for colorectal malignant neoplasm Providers:             Benay Pike. Delbert Darley MD, MD Medicines:             Propofol per Anesthesia Complications:         No immediate complications. Procedure:             Pre-Anesthesia Assessment:                        - The risks and benefits of the procedure and the                         sedation options and risks were discussed with the                         patient. All questions were answered and informed                         consent was obtained.                        - Patient identification and proposed procedure were                         verified prior to the procedure by the nurse. The                         procedure was verified in the procedure room.                        - ASA Grade Assessment: III - A patient with severe                         systemic disease.                        - After reviewing the risks and benefits, the patient                         was deemed in satisfactory condition to undergo the                         procedure.                        After obtaining informed consent, the colonoscope was                         passed under direct vision. Throughout the procedure,                         the patient's blood pressure, pulse, and oxygen  saturations were monitored continuously. The                         Colonoscope was introduced through the anus and                         advanced to the the cecum, identified by appendiceal                         orifice and ileocecal valve. The colonoscopy was                         performed  without difficulty. The patient tolerated                         the procedure well. The quality of the bowel                         preparation was adequate. The ileocecal valve,                         appendiceal orifice, and rectum were photographed. Findings:      The perianal and digital rectal examinations were normal. Pertinent       negatives include normal sphincter tone and no palpable rectal lesions.      Non-bleeding internal hemorrhoids were found during retroflexion. The       hemorrhoids were Grade I (internal hemorrhoids that do not prolapse).      A few small-mouthed diverticula were found in the sigmoid colon.      A 5 mm polyp was found in the cecum. The polyp was semi-pedunculated.       The polyp was removed with a jumbo cold forceps. Resection and retrieval       were complete.      The exam was otherwise without abnormality. Impression:            - Non-bleeding internal hemorrhoids.                        - Diverticulosis in the sigmoid colon.                        - One 5 mm polyp in the cecum, removed with a jumbo                         cold forceps. Resected and retrieved.                        - The examination was otherwise normal. Recommendation:        - Patient has a contact number available for                         emergencies. The signs and symptoms of potential                         delayed complications were discussed with the patient.                         Return to normal activities tomorrow. Written  discharge instructions were provided to the patient.                        - Resume previous diet.                        - Continue present medications.                        - Repeat colonoscopy is recommended for surveillance.                         The colonoscopy date will be determined after                         pathology results from today's exam become available                         for review.                         - May proceed with renal transplantation listing from                         a GI standpoint.                        - Return to GI office PRN.                        - The findings and recommendations were discussed with                         the patient. Procedure Code(s):     --- Professional ---                        [id number], Colonoscopy, flexible; with biopsy, single or                         multiple Diagnosis Code(s):     --- Professional ---                        K57.30, Diverticulosis of large intestine without                         perforation or abscess without bleeding                        K63.5, Polyp of colon                        K64.0, First degree hemorrhoids                        Z12.11, Encounter for screening for malignant neoplasm                         of colon CPT copyright 2019 [employer]. All rights reserved. The codes documented in this report are preliminary and upon coder review may  be revised to meet current compliance requirements. Efrain Sella MD, MD  07/09/2021 8:48:32 AM This report has been signed electronically. Number of Addenda: 0 Note Initiated On: 07/09/2021 7:01 AM Scope Withdrawal Time: 0 hours 6 minutes 8 seconds  Total Procedure Duration: 0 hours 10 minutes 38 seconds  Estimated Blood Loss:  Estimated blood loss: none.      [facility]

## 2021-07-09 NOTE — Interval H&P Note (Signed)
History and Physical Interval Note:  07/09/2021 8:22 AM  [name] [name]  has presented today for surgery, with the diagnosis of COLON CANCER SCREENING.  The various methods of treatment have been discussed with the patient and family. After consideration of risks, benefits and other options for treatment, the patient has consented to  Procedure(s) with comments: COLONOSCOPY WITH PROPOFOL (N/A) - IDDM as a surgical intervention.  The patient's history has been reviewed, patient examined, no change in status, stable for surgery.  I have reviewed the patient's chart and labs.  Questions were answered to the patient's satisfaction.     [place], [place]

## 2021-07-10 ENCOUNTER — Encounter: Payer: Self-pay | Admitting: Internal Medicine

## 2021-07-10 LAB — SURGICAL PATHOLOGY

## 2021-07-10 NOTE — Anesthesia Postprocedure Evaluation (Signed)
Anesthesia Post Note  Patient: [name] [name]  Procedure(s) Performed: COLONOSCOPY WITH PROPOFOL  Patient location during evaluation: Endoscopy Anesthesia Type: General Level of consciousness: awake and alert Pain management: pain level controlled Vital Signs Assessment: post-procedure vital signs reviewed and stable Respiratory status: spontaneous breathing, nonlabored ventilation and respiratory function stable Cardiovascular status: blood pressure returned to baseline and stable Postop Assessment: no apparent nausea or vomiting Anesthetic complications: no   No notable events documented.   Last Vitals:  Vitals:   07/09/21 0859 07/09/21 0909  BP: 138/77 (!) 143/78  Pulse: 74 71  Resp: 13 13  Temp:    SpO2: 96% 98%    Last Pain:  Vitals:   07/09/21 0909  TempSrc:   PainSc: 0-No pain                 [place] Ouch

## 2021-07-16 DIAGNOSIS — E113513 Type 2 diabetes mellitus with proliferative diabetic retinopathy with macular edema, bilateral: Secondary | ICD-10-CM | POA: Diagnosis not present

## 2021-07-23 NOTE — Progress Notes (Signed)
Remote ICD transmission.   

## 2021-07-29 ENCOUNTER — Other Ambulatory Visit: Payer: Self-pay | Admitting: Internal Medicine

## 2021-07-29 ENCOUNTER — Other Ambulatory Visit: Payer: Self-pay | Admitting: Cardiovascular Disease

## 2021-07-29 NOTE — Telephone Encounter (Signed)
Please schedule overdue F/U appointment with Dr. Caryl Comes. Thank you!

## 2021-07-29 NOTE — Telephone Encounter (Signed)
LMOV to schedule follow up 

## 2021-08-27 DIAGNOSIS — E113513 Type 2 diabetes mellitus with proliferative diabetic retinopathy with macular edema, bilateral: Secondary | ICD-10-CM | POA: Diagnosis not present

## 2021-09-29 DIAGNOSIS — N184 Chronic kidney disease, stage 4 (severe): Secondary | ICD-10-CM | POA: Diagnosis not present

## 2021-09-29 DIAGNOSIS — E1129 Type 2 diabetes mellitus with other diabetic kidney complication: Secondary | ICD-10-CM | POA: Diagnosis not present

## 2021-10-02 ENCOUNTER — Ambulatory Visit (INDEPENDENT_AMBULATORY_CARE_PROVIDER_SITE_OTHER): Payer: Medicare HMO

## 2021-10-02 DIAGNOSIS — R808 Other proteinuria: Secondary | ICD-10-CM | POA: Diagnosis not present

## 2021-10-02 DIAGNOSIS — N184 Chronic kidney disease, stage 4 (severe): Secondary | ICD-10-CM | POA: Diagnosis not present

## 2021-10-02 DIAGNOSIS — I10 Essential (primary) hypertension: Secondary | ICD-10-CM | POA: Diagnosis not present

## 2021-10-02 DIAGNOSIS — E1129 Type 2 diabetes mellitus with other diabetic kidney complication: Secondary | ICD-10-CM | POA: Diagnosis not present

## 2021-10-02 DIAGNOSIS — N2581 Secondary hyperparathyroidism of renal origin: Secondary | ICD-10-CM | POA: Diagnosis not present

## 2021-10-02 DIAGNOSIS — I255 Ischemic cardiomyopathy: Secondary | ICD-10-CM

## 2021-10-02 LAB — CUP PACEART REMOTE DEVICE CHECK
Battery Remaining Longevity: 101 mo
Battery Remaining Percentage: 82 %
Battery Voltage: 2.99 V
Brady Statistic RV Percent Paced: 1 %
Date Time Interrogation Session: 20221117010240
HighPow Impedance: 69 Ohm
Implantable Lead Implant Date: 20201118
Implantable Lead Location: 753860
Implantable Pulse Generator Implant Date: 20201118
Lead Channel Impedance Value: 380 Ohm
Lead Channel Pacing Threshold Amplitude: 0.5 V
Lead Channel Pacing Threshold Pulse Width: 0.3 ms
Lead Channel Sensing Intrinsic Amplitude: 11.4 mV
Lead Channel Setting Pacing Amplitude: 2.5 V
Lead Channel Setting Pacing Pulse Width: 0.3 ms
Lead Channel Setting Sensing Sensitivity: 0.5 mV
Pulse Gen Serial Number: 111013097

## 2021-10-05 NOTE — Progress Notes (Signed)
Date:  10/06/2021   ID:  [name], DOB [birth year], MRN [id number]  Patient Location:  [place] [address]   Provider location:   [facility], [place] office  PCP:  Tracie Harrier, MD  Cardiologist:  Patsy Baltimore  Chief Complaint  Patient presents with   12 month follow up     Patient recently at [facility], was told needs to have a Cardiac Cath/Stent placement before getting set up for dialysis and would need to have a kidney transplant. Medications reviewed by the patient verbally.     History of Present Illness:    [name] [name] is a 60 y.o. male  past medical history of diabetes, poorly controlled with poor diet, finances limiting medications hyperlipidemia,  coronary artery disease with bypass surgery x4 at the end of June 2015 with Dr. Cyndia Bent, orthostatic hypotension  EF at first <20%, Hospitalization November 2019 for acute on chronic systolic CHF ejection fraction 30 to 35%, nonsustained VT on monitors  Syncope,  EF 30 to 35% in 06/2019 ICD for VT who presents for routine follow-up Of his coronary artery disease, history of bypass  Wife with breast cancer, complication in recovery, required surgery Has MRSA  Not on HD yet, Denies angina, active  No regular exercise  On ozympic, weight down "20 pounds" Stopped torsemide, in setting of diarrhea ("could have been from ozympic) Used to be on  torsemide 40 alternating with 20  Having dizziness from low pressures, better now Off amlodipine  No palpitations, no ICD shock  Sees nephrology, Q6 weeks CR 5  Stress test from [facility] reviewed, large region of fixed defect, no change from 2019, minimal ischemia  EKG personally reviewed by myself on todays visit NSr rate 84 bpm, old anterior MI, unchanged  Labs reviewed HBA1C 8.7 HCT 43 Total chol 128, LDL 73  EKG personally reviewed by myself on todays visit NSR old anterior MI, pvc, old inferior Mi, rate 72  bpm  Past medical history reviewed  hospitalization October 2020, syncope Hypoxia on arrival in the setting of CHF  concern for sustained VT on telemetry EP unable to evaluate patient on an inpatient basis -LifeVest has been placed  Ejection fraction 30 to 35% previous MI, known ischemic   Had significant diuresis during his hospital course -Recommend torsemide 40 daily 3 pound weight gain with take torsemide 40 twice daily  Medication changes madehydralazine up to 50mg  3 times daily, isosorbide 30 twice daily, continue carvedilol   Echo 06/29/2019 EF of 30-35%.   Echo  10/05/2018  EF of 30 to 35%, diffuse hypokinesis   nuclear stress test on 10/07/2018 that showed a large region of fixed inferior and inferior lateral wall perfusion defect consistent with previous MI , EF was estimated at 36%.    Event Monitor Normal sinus rhythm avg HR of 81 bpm. ---8 Ventricular Tachycardia runs occurred, the run with the fastest interval lasting 4 beats with a max rate of 200 bpm,  the longest lasting 17 beats with an avg rate of 120 bpm.  ----7 Supraventricular Tachycardia runs occurred,  the run with the fastest interval lasting 5 beats with a max rate of 148 bpm,  the longest lasting 13 beats with an avg rate of 110 bpm.     history in October 2014 of MRSA, requiring amputation of several of his toes, severe renal dysfunction at that time felt secondary to antibiotics with subsequent improvement of his renal  function. Prior echocardiogram September 2014 showing ejection fraction less than 20% ejection fraction was 30-35% by echo 08/02/2014   cardiac catheterization may 2015 showing 70% left main disease, 70% diagonal #2 disease, 80% proximal circumflex disease, 80% mid circumflex disease, 99% proximal RCA disease, 99% PL branch disease, ejection fraction 40%   Past Medical History:  Diagnosis Date   Anemia    CAD (coronary artery disease)    a. 04/2014 CABG x 4 (LIMA->LAD, VG->Diag,  VG->OM, VG->PDA).   Chronic systolic CHF (congestive heart failure) ([place])    a. 07/2014 Echo: EF 30-35%.   CKD (chronic kidney disease), stage III ([place])    a. 12/2015 Creat 1.8.   Diabetic foot ulcers ([place])    a. left foot 2nd digit ant 5 th digit 05/03/14; b. 08/2014 s/p amputation of toes on L foot.   Dysrhythmia    Hypercholesterolemia    a. 12/2015 TC 116, TG 154, HDL 24, LDL 61-->Atorvastatin 40.   Hypertension    Hypertensive heart disease    Ischemic cardiomyopathy    a. 07/2013 Echo: EF 20%; b. 07/2014 Echo: EF 30-35%, diff HK, Gr1 DD, mildly dil LA, nl PASP.   Myocardial infarction ([place])    Neuropathy in diabetes ([place])    Open wound    foot   Orthostatic hypotension    PAD (peripheral artery disease) ([place])    Peripheral vascular disease (HCC)    Type II diabetes mellitus ([place])    a. 12/2015 HbA1c = 13.9.   Past Surgical History:  Procedure Laterality Date   ACHILLES TENDON SURGERY Right 01/22/2017   Procedure: ACHILLES LENGTHENING/KIDNER/TAL/Teno achilles lengthening;  Surgeon: Samara Deist, DPM;  Location: [facility];  Service: Podiatry;  Laterality: Right;   AMPUTATION Right 08/19/2020   Procedure: AMPUTATION RAY-Right Partial 5th Ray;  Surgeon: Caroline More, DPM;  Location: [facility];  Service: Podiatry;  Laterality: Right;   CARDIAC CATHETERIZATION     03/2014   CATARACT EXTRACTION     COLONOSCOPY WITH PROPOFOL N/A 07/09/2021   Procedure: COLONOSCOPY WITH PROPOFOL;  Surgeon: Toledo, Benay Pike, MD;  Location: [facility];  Service: Gastroenterology;  Laterality: N/A;  IDDM   CORONARY ARTERY BYPASS GRAFT N/A 05/07/2014   Procedure: CORONARY ARTERY BYPASS GRAFTING (CABG);  Surgeon: Gaye Pollack, MD;  Location: [place];  Service: Open Heart Surgery;  Laterality: N/A;  Times 4 using left internal mammary artery and endoscopically harvested right saphenous vein   DIALYSIS/PERMA CATHETER INSERTION N/A 08/04/2019   Procedure: DIALYSIS/PERMA CATHETER INSERTION;  Surgeon: Katha Cabal, MD;  Location: [place] [facility];  Service: Cardiovascular;  Laterality: N/A;   DIALYSIS/PERMA CATHETER REMOVAL N/A 08/08/2019   Procedure: DIALYSIS/PERMA CATHETER REMOVAL;  Surgeon: Katha Cabal, MD;  Location: [place] [facility];  Service: Cardiovascular;  Laterality: N/A;   EYE SURGERY     ICD IMPLANT N/A 10/04/2019   Procedure: ICD IMPLANT;  Surgeon: Deboraha Sprang, MD;  Location: [place] [facility];  Service: Cardiovascular;  Laterality: N/A;   INTRAOPERATIVE TRANSESOPHAGEAL ECHOCARDIOGRAM N/A 05/07/2014   Procedure: INTRAOPERATIVE TRANSESOPHAGEAL ECHOCARDIOGRAM;  Surgeon: Gaye Pollack, MD;  Location: [place] [facility];  Service: Open Heart Surgery;  Laterality: N/A;   left foot osteomyelitis and wound after surgery     TOE AMPUTATION     Left 3 and 4 toes   TONSILECTOMY/ADENOIDECTOMY WITH MYRINGOTOMY        Allergies:   Ampicillin-sulbactam sodium, Other, and Zosyn [piperacillin sod-tazobactam so]   Social History   Tobacco  Use   Smoking status: Never   Smokeless tobacco: Never  Vaping Use   Vaping Use: Never used  Substance Use Topics   Alcohol use: No   Drug use: No     Current Outpatient Medications on File Prior to Visit  Medication Sig Dispense Refill   acetaminophen (TYLENOL) 325 MG tablet Take by mouth every 6 (six) hours as needed for mild pain or moderate pain.     allopurinol (ZYLOPRIM) 100 MG tablet Take 100 mg by mouth daily.     aspirin EC 81 MG tablet Take 81 mg by mouth daily.     atorvastatin (LIPITOR) 40 MG tablet TAKE 1 TABLET AT BEDTIME 90 tablet 1   carvedilol (COREG) 6.25 MG tablet TAKE 1 TABLET TWICE DAILY PLEASE CALL TO SCHEDULE OFFICE VISIT FOR FURTHER REFILLS. THANK YOU! 180 tablet 0   Cyanocobalamin (VITAMIN B 12 PO) Take 1,000 mcg by mouth at bedtime.      ferrous sulfate 325 (65 FE) MG tablet Take 325 mg by mouth daily.     glimepiride (AMARYL) 4 MG tablet Take 1 tablet by mouth 2 (two) times daily.     HYDROcodone-acetaminophen  (NORCO/VICODIN) 5-325 MG tablet Take 1 tablet by mouth daily as needed for moderate pain.     insulin aspart (NOVOLOG) 100 UNIT/ML injection Inject 0-9 Units into the skin 3 (three) times daily with meals. (Patient taking differently: Inject 0-9 Units into the skin 3 (three) times daily with meals. Sliding Scale) 10 mL 11   insulin NPH Human (NOVOLIN N) 100 UNIT/ML injection Inject 20 Units into the skin in the morning and at bedtime.     isosorbide mononitrate (IMDUR) 60 MG 24 hr tablet Take 1 tablet (60 mg total) by mouth daily. PLEASE SCHEDULE OFFICE VISIT FOR FURTHER REFILLS. THANK YOU! 90 tablet 0   Multiple Vitamin (MULTIVITAMIN) capsule Take 1 capsule by mouth daily.     nitroGLYCERIN (NITROSTAT) 0.4 MG SL tablet Place 1 tablet (0.4 mg total) under the tongue every 5 (five) minutes as needed for chest pain. 25 tablet 1   OZEMPIC, 0.25 OR 0.5 MG/DOSE, 2 MG/1.5ML SOPN Inject 0.5 mg into the skin.     sodium bicarbonate 650 MG tablet Take 1,300 mg by mouth 2 (two) times daily.      torsemide (DEMADEX) 20 MG tablet Take 20 mg by mouth daily.     zolpidem (AMBIEN) 5 MG tablet TAKE 1 TABLET NIGHTLY AS NEEDED FOR SLEEP AS DIRECTED     No current facility-administered medications on file prior to visit.     Family Hx: The patient's family history includes Diabetes type II in an other family member; Heart disease in his father; Hypertension in an other family member; Kidney disease in an other family member; Ovarian cancer in his mother.  ROS:   Please see the history of present illness.    Review of Systems  Constitutional: Negative.        Weight gain  HENT: Negative.    Respiratory: Negative.    Cardiovascular: Negative.   Gastrointestinal: Negative.   Musculoskeletal: Negative.   Neurological: Negative.   Psychiatric/Behavioral: Negative.    All other systems reviewed and are negative.   Labs/Other Tests and Data Reviewed:    Recent Labs: No results found for requested labs within  last 8760 hours.   Recent Lipid Panel No results found for: CHOL, TRIG, HDL, CHOLHDL, LDLCALC, LDLDIRECT  Wt Readings from Last 3 Encounters:  10/06/21 263 lb 2 oz (  119.4 kg)  07/09/21 275 lb 9.2 oz (125 kg)  10/01/20 273 lb (123.8 kg)     Exam:    Vital Signs: Vital signs may also be detailed in the HPI BP 120/82 (BP Location: Left Arm, Patient Position: Sitting, Cuff Size: Normal)   Pulse 84   Ht 6\' 1"  (1.854 m)   Wt 263 lb 2 oz (119.4 kg)   SpO2 98%   BMI 34.72 kg/m   Constitutional:  oriented to person, place, and time. No distress.  HENT:  Head: Grossly normal Eyes:  no discharge. No scleral icterus.  Neck: No JVD, no carotid bruits  Cardiovascular: Regular rate and rhythm, no murmurs appreciated Pulmonary/Chest: Clear to auscultation bilaterally, no wheezes or rails Abdominal: Soft.  no distension.  no tenderness.  Musculoskeletal: Normal range of motion Neurological:  normal muscle tone. Coordination normal. No atrophy Skin: Skin warm and dry Psychiatric: normal affect, pleasant  ASSESSMENT & PLAN:    Problem List Items Addressed This Visit       Cardiology Problems   Chronic systolic heart failure (HCC) (Chronic)   Relevant Medications   torsemide (DEMADEX) 20 MG tablet   NSVT (nonsustained ventricular tachycardia)   Relevant Medications   torsemide (DEMADEX) 20 MG tablet   CAD (coronary artery disease)   Relevant Medications   torsemide (DEMADEX) 20 MG tablet   Ischemic cardiomyopathy - Primary   Relevant Medications   torsemide (DEMADEX) 20 MG tablet   Orthostatic hypotension   Relevant Medications   torsemide (DEMADEX) 20 MG tablet   Other Visit Diagnoses     ICD (implantable cardioverter-defibrillator) in place       CKD (chronic kidney disease), stage IV (HCC)         Ischemic cardiomyopathy coronary disease, CABG, large region of infarct seen on stress test 2019, and again recently at [place] history nonsustained VT Has ICD, downloads  reviewed, no significant arrhythmia noted on isosorbide 60 daily, Coreg,  For low pressures, may need to cut imdur in 1/2 daily (recent near syncope episodes in setting of weight loss) -No strong indication for cardiac catheterization unless mandated by [facility] for transplant listing.  He is not having anginal symptoms, stress test unchanged from 2019 with no significant ischemia  CAD with stable angin Stress test at [facility], unchanged from testing here 2019 No anginal sx No strong indication for cardiac cath, will talk with [facility] transplant team  Poor control diabetes type 2 with complications No longer working with endocrine A1c 8.3 Weight down  Ventricular tachycardia Has ICD, followed by EP Rare epsiodes, short On coreg 6.25 BID  ESRD Not on HD yet   Total encounter time more than 35 minutes  Greater than 50% was spent in counseling and coordination of care with the patient   Signed, Ida Rogue, [id number], [place], [place] [address]

## 2021-10-06 ENCOUNTER — Other Ambulatory Visit: Payer: Self-pay

## 2021-10-06 ENCOUNTER — Ambulatory Visit: Payer: Medicare HMO | Admitting: Cardiovascular Disease

## 2021-10-06 ENCOUNTER — Encounter: Payer: Self-pay | Admitting: Cardiovascular Disease

## 2021-10-06 VITALS — BP 120/82 | HR 84 | Ht 73.0 in | Wt 263.1 lb

## 2021-10-06 DIAGNOSIS — N186 End stage renal disease: Secondary | ICD-10-CM | POA: Diagnosis not present

## 2021-10-06 DIAGNOSIS — I639 Cerebral infarction, unspecified: Secondary | ICD-10-CM | POA: Diagnosis not present

## 2021-10-06 DIAGNOSIS — I25118 Atherosclerotic heart disease of native coronary artery with other forms of angina pectoris: Secondary | ICD-10-CM

## 2021-10-06 DIAGNOSIS — N184 Chronic kidney disease, stage 4 (severe): Secondary | ICD-10-CM

## 2021-10-06 DIAGNOSIS — Z95810 Presence of automatic (implantable) cardiac defibrillator: Secondary | ICD-10-CM | POA: Diagnosis not present

## 2021-10-06 DIAGNOSIS — I2510 Atherosclerotic heart disease of native coronary artery without angina pectoris: Secondary | ICD-10-CM

## 2021-10-06 DIAGNOSIS — I255 Ischemic cardiomyopathy: Secondary | ICD-10-CM | POA: Diagnosis not present

## 2021-10-06 DIAGNOSIS — I951 Orthostatic hypotension: Secondary | ICD-10-CM

## 2021-10-06 DIAGNOSIS — E1122 Type 2 diabetes mellitus with diabetic chronic kidney disease: Secondary | ICD-10-CM

## 2021-10-06 DIAGNOSIS — Z955 Presence of coronary angioplasty implant and graft: Secondary | ICD-10-CM

## 2021-10-06 DIAGNOSIS — I5022 Chronic systolic (congestive) heart failure: Secondary | ICD-10-CM

## 2021-10-06 DIAGNOSIS — I4729 Other ventricular tachycardia: Secondary | ICD-10-CM

## 2021-10-06 DIAGNOSIS — I472 Ventricular tachycardia, unspecified: Secondary | ICD-10-CM

## 2021-10-06 DIAGNOSIS — I429 Cardiomyopathy, unspecified: Secondary | ICD-10-CM

## 2021-10-06 MED ORDER — TORSEMIDE 20 MG PO TABS: 20.0000 mg | ORAL_TABLET | Freq: Every day | ORAL | 3 refills | Status: DC

## 2021-10-06 MED ORDER — ATORVASTATIN CALCIUM 40 MG PO TABS: 40.0000 mg | ORAL_TABLET | Freq: Every day | ORAL | 3 refills | Status: DC

## 2021-10-06 MED ORDER — ISOSORBIDE MONONITRATE ER 60 MG PO TB24: 60.0000 mg | ORAL_TABLET | Freq: Every day | ORAL | 3 refills | Status: DC

## 2021-10-06 MED ORDER — CARVEDILOL 6.25 MG PO TABS: 6.2500 mg | ORAL_TABLET | Freq: Two times a day (BID) | ORAL | 3 refills | Status: DC

## 2021-10-06 NOTE — Patient Instructions (Addendum)
Medication Instructions:  No changes  If you need a refill on your cardiac medications before your next appointment, please call your pharmacy.    Lab work: No new labs needed   Testing/Procedures: No new testing needed   Follow-Up: At [facility], you and your health needs are our priority.  As part of our continuing mission to provide you with exceptional heart care, we have created designated Provider Care Teams.  These Care Teams include your primary Cardiologist (physician) and Advanced Practice Providers (APPs -  Physician Assistants and Nurse Practitioners) who all work together to provide you with the care you need, when you need it.  You will need a follow up appointment in 6 months  Providers on your designated Care Team:   Christopher Berge, NP Ryan Dunn, PA-C Cadence Furth, PA-C  COVID-19 Vaccine Information can be found at: [email or web address] For questions related to vaccine distribution or appointments, please email [email or web address] or call [phone].   

## 2021-10-08 DIAGNOSIS — E113513 Type 2 diabetes mellitus with proliferative diabetic retinopathy with macular edema, bilateral: Secondary | ICD-10-CM | POA: Diagnosis not present

## 2021-10-08 DIAGNOSIS — E113511 Type 2 diabetes mellitus with proliferative diabetic retinopathy with macular edema, right eye: Secondary | ICD-10-CM | POA: Diagnosis not present

## 2021-10-13 NOTE — Progress Notes (Signed)
Remote ICD transmission.   

## 2021-10-21 DIAGNOSIS — I10 Essential (primary) hypertension: Secondary | ICD-10-CM | POA: Diagnosis not present

## 2021-10-21 DIAGNOSIS — N184 Chronic kidney disease, stage 4 (severe): Secondary | ICD-10-CM | POA: Diagnosis not present

## 2021-10-21 DIAGNOSIS — Z95810 Presence of automatic (implantable) cardiac defibrillator: Secondary | ICD-10-CM | POA: Diagnosis not present

## 2021-10-21 DIAGNOSIS — E1122 Type 2 diabetes mellitus with diabetic chronic kidney disease: Secondary | ICD-10-CM | POA: Diagnosis not present

## 2021-10-21 DIAGNOSIS — I25810 Atherosclerosis of coronary artery bypass graft(s) without angina pectoris: Secondary | ICD-10-CM | POA: Diagnosis not present

## 2021-10-21 DIAGNOSIS — Z794 Long term (current) use of insulin: Secondary | ICD-10-CM | POA: Diagnosis not present

## 2021-10-21 DIAGNOSIS — R635 Abnormal weight gain: Secondary | ICD-10-CM | POA: Diagnosis not present

## 2021-10-21 DIAGNOSIS — E7800 Pure hypercholesterolemia, unspecified: Secondary | ICD-10-CM | POA: Diagnosis not present

## 2021-10-21 DIAGNOSIS — Z6837 Body mass index (BMI) 37.0-37.9, adult: Secondary | ICD-10-CM | POA: Diagnosis not present

## 2021-10-28 ENCOUNTER — Encounter: Payer: Self-pay | Admitting: Internal Medicine

## 2021-10-28 ENCOUNTER — Other Ambulatory Visit: Payer: Self-pay

## 2021-10-28 ENCOUNTER — Ambulatory Visit: Payer: Medicare HMO | Admitting: Internal Medicine

## 2021-10-28 VITALS — BP 100/66 | HR 81 | Ht 73.0 in | Wt 255.0 lb

## 2021-10-28 DIAGNOSIS — I2510 Atherosclerotic heart disease of native coronary artery without angina pectoris: Secondary | ICD-10-CM | POA: Diagnosis not present

## 2021-10-28 DIAGNOSIS — I5022 Chronic systolic (congestive) heart failure: Secondary | ICD-10-CM | POA: Diagnosis not present

## 2021-10-28 DIAGNOSIS — I951 Orthostatic hypotension: Secondary | ICD-10-CM

## 2021-10-28 DIAGNOSIS — Z95810 Presence of automatic (implantable) cardiac defibrillator: Secondary | ICD-10-CM | POA: Diagnosis not present

## 2021-10-28 DIAGNOSIS — Z89421 Acquired absence of other right toe(s): Secondary | ICD-10-CM | POA: Diagnosis not present

## 2021-10-28 DIAGNOSIS — I255 Ischemic cardiomyopathy: Secondary | ICD-10-CM

## 2021-10-28 DIAGNOSIS — N185 Chronic kidney disease, stage 5: Secondary | ICD-10-CM | POA: Diagnosis not present

## 2021-10-28 DIAGNOSIS — Z951 Presence of aortocoronary bypass graft: Secondary | ICD-10-CM | POA: Diagnosis not present

## 2021-10-28 DIAGNOSIS — Z89422 Acquired absence of other left toe(s): Secondary | ICD-10-CM | POA: Diagnosis not present

## 2021-10-28 DIAGNOSIS — Z6833 Body mass index (BMI) 33.0-33.9, adult: Secondary | ICD-10-CM | POA: Diagnosis not present

## 2021-10-28 DIAGNOSIS — I4729 Other ventricular tachycardia: Secondary | ICD-10-CM | POA: Diagnosis not present

## 2021-10-28 DIAGNOSIS — Z794 Long term (current) use of insulin: Secondary | ICD-10-CM | POA: Diagnosis not present

## 2021-10-28 DIAGNOSIS — E1122 Type 2 diabetes mellitus with diabetic chronic kidney disease: Secondary | ICD-10-CM | POA: Diagnosis not present

## 2021-10-28 MED ORDER — ISOSORBIDE MONONITRATE ER 30 MG PO TB24: ORAL_TABLET | ORAL | 3 refills | Status: DC

## 2021-10-28 MED ORDER — METOPROLOL SUCCINATE ER 25 MG PO TB24: ORAL_TABLET | ORAL | 3 refills | Status: DC

## 2021-10-28 NOTE — Progress Notes (Signed)
Patient Care Team: Tracie Harrier, MD as PCP - General (Internal Medicine) Minna Merritts, MD as PCP - Cardiology (Cardiology) Deboraha Sprang, MD as PCP - Electrophysiology (Cardiology) Alisa Graff, FNP as Nurse Practitioner (Family Medicine)   HPI  [name] [name] is a 60 y.o. male seen early in March prior to Covid for consideration of an ICD for primary prevention for ICM - prior CABG/CHF .  With his inclination to defer a decision and hope that his heart muscle function will continue to improve.  There was no interval improvement and he underwent ICD implantation 11/20.  History of MRSA infection complicated by shock and renal failure.  His wife currently is at [facility] and has been for the last 30 days with a MRSA infection following surgery  His renal function somewhat better GFR about 18.  He is on the transplant list at [facility] but inactive because cardiology there thinks he needs a catheterization and he is reluctant because of the risks of precipitating dialysis  The patient denies chest pain, shortness of breath, nocturnal dyspnea, orthopnea or peripheral edem.  There have been no palpitations, or syncope.  Complains of orthostatic lightheadedness which is intermittent.  Has not responded in the past to compression of his abdomen or thigh-high stockings.   DATE TEST EF    5/15 LHC  40%  3V CAD>> CABG  7/18 Echo   40-45 %    11/19 Echo   30-35 %    11/19 MYOVIEW 36 Inferior Scar Min ischemia   8/20 Echo  30-*35%        Date Cr K Hgb  12/19 2.92 5.1 11.2  2/20  2.9 5.0 11.4  10/20 3.74 5.0 11.1  10/21 3.21 5.0 10  12/22 3.5 4.8 13.5        Records and Results Reviewed   Past Medical History:  Diagnosis Date   Anemia    CAD (coronary artery disease)    a. 04/2014 CABG x 4 (LIMA->LAD, VG->Diag, VG->OM, VG->PDA).   Chronic systolic CHF (congestive heart failure) ([place])    a. 07/2014 Echo: EF 30-35%.   CKD (chronic kidney disease), stage III ([place])    a.  12/2015 Creat 1.8.   Diabetic foot ulcers ([place])    a. left foot 2nd digit ant 5 th digit 05/03/14; b. 08/2014 s/p amputation of toes on L foot.   Dysrhythmia    Hypercholesterolemia    a. 12/2015 TC 116, TG 154, HDL 24, LDL 61-->Atorvastatin 40.   Hypertension    Hypertensive heart disease    Ischemic cardiomyopathy    a. 07/2013 Echo: EF 20%; b. 07/2014 Echo: EF 30-35%, diff HK, Gr1 DD, mildly dil LA, nl PASP.   Myocardial infarction ([place])    Neuropathy in diabetes ([place])    Open wound    foot   Orthostatic hypotension    PAD (peripheral artery disease) ([place])    Peripheral vascular disease (HCC)    Type II diabetes mellitus ([place])    a. 12/2015 HbA1c = 13.9.    Past Surgical History:  Procedure Laterality Date   ACHILLES TENDON SURGERY Right 01/22/2017   Procedure: ACHILLES LENGTHENING/KIDNER/TAL/Teno achilles lengthening;  Surgeon: Samara Deist, DPM;  Location: [facility];  Service: Podiatry;  Laterality: Right;   AMPUTATION Right 08/19/2020   Procedure: AMPUTATION RAY-Right Partial 5th Ray;  Surgeon: Caroline More, DPM;  Location: [facility];  Service: Podiatry;  Laterality: Right;   CARDIAC CATHETERIZATION  03/2014   CATARACT EXTRACTION     COLONOSCOPY WITH PROPOFOL N/A 07/09/2021   Procedure: COLONOSCOPY WITH PROPOFOL;  Surgeon: Toledo, Benay Pike, MD;  Location: [facility];  Service: Gastroenterology;  Laterality: N/A;  IDDM   CORONARY ARTERY BYPASS GRAFT N/A 05/07/2014   Procedure: CORONARY ARTERY BYPASS GRAFTING (CABG);  Surgeon: Gaye Pollack, MD;  Location: [place];  Service: Open Heart Surgery;  Laterality: N/A;  Times 4 using left internal mammary artery and endoscopically harvested right saphenous vein   DIALYSIS/PERMA CATHETER INSERTION N/A 08/04/2019   Procedure: DIALYSIS/PERMA CATHETER INSERTION;  Surgeon: Katha Cabal, MD;  Location: [place] [facility];  Service: Cardiovascular;  Laterality: N/A;   DIALYSIS/PERMA CATHETER REMOVAL N/A 08/08/2019   Procedure:  DIALYSIS/PERMA CATHETER REMOVAL;  Surgeon: Katha Cabal, MD;  Location: [place] [facility];  Service: Cardiovascular;  Laterality: N/A;   EYE SURGERY     ICD IMPLANT N/A 10/04/2019   Procedure: ICD IMPLANT;  Surgeon: Deboraha Sprang, MD;  Location: [place] [place] [place] [facility];  Service: Cardiovascular;  Laterality: N/A;   INTRAOPERATIVE TRANSESOPHAGEAL ECHOCARDIOGRAM N/A 05/07/2014   Procedure: INTRAOPERATIVE TRANSESOPHAGEAL ECHOCARDIOGRAM;  Surgeon: Gaye Pollack, MD;  Location: [place] [facility];  Service: Open Heart Surgery;  Laterality: N/A;   left foot osteomyelitis and wound after surgery     TOE AMPUTATION     Left 3 and 4 toes   TONSILECTOMY/ADENOIDECTOMY WITH MYRINGOTOMY      Current Meds  Medication Sig   acetaminophen (TYLENOL) 325 MG tablet Take by mouth every 6 (six) hours as needed for mild pain or moderate pain.   allopurinol (ZYLOPRIM) 100 MG tablet Take 100 mg by mouth daily.   aspirin EC 81 MG tablet Take 81 mg by mouth daily.   atorvastatin (LIPITOR) 40 MG tablet Take 1 tablet (40 mg total) by mouth at bedtime.   carvedilol (COREG) 6.25 MG tablet Take 1 tablet (6.25 mg total) by mouth 2 (two) times daily.   Cyanocobalamin (VITAMIN B 12 PO) Take 1,000 mcg by mouth at bedtime.    ferrous sulfate 325 (65 FE) MG tablet Take 325 mg by mouth daily.   glimepiride (AMARYL) 4 MG tablet Take 1 tablet by mouth 2 (two) times daily.   HYDROcodone-acetaminophen (NORCO/VICODIN) 5-325 MG tablet Take 1 tablet by mouth daily as needed for moderate pain.   insulin aspart (NOVOLOG) 100 UNIT/ML injection Inject 0-9 Units into the skin 3 (three) times daily with meals. (Patient taking differently: Inject 0-9 Units into the skin 3 (three) times daily with meals. Sliding Scale)   insulin NPH Human (NOVOLIN N) 100 UNIT/ML injection Inject 20 Units into the skin in the morning and at bedtime.   isosorbide mononitrate (IMDUR) 60 MG 24 hr tablet Take 1 tablet (60 mg total) by mouth daily. (Patient taking  differently: Take 30 mg by mouth daily.)   Multiple Vitamin (MULTIVITAMIN) capsule Take 1 capsule by mouth daily.   nitroGLYCERIN (NITROSTAT) 0.4 MG SL tablet Place 1 tablet (0.4 mg total) under the tongue every 5 (five) minutes as needed for chest pain.   OZEMPIC, 0.25 OR 0.5 MG/DOSE, 2 MG/1.5ML SOPN Inject 0.5 mg into the skin.   sodium bicarbonate 650 MG tablet Take 1,300 mg by mouth 2 (two) times daily.    torsemide (DEMADEX) 20 MG tablet Take 1 tablet (20 mg total) by mouth daily.   zolpidem (AMBIEN) 5 MG tablet TAKE 1 TABLET NIGHTLY AS NEEDED FOR SLEEP AS DIRECTED    Allergies  Allergen  Reactions   Ampicillin-Sulbactam Sodium Anaphylaxis    Allergic interstitial nephritis to Unasyn/Zosyn in 2014. Doesn't believe he has an allergy to PCN as he has taken it previously, according to wife. Allergic interstitial nephritis to Unasyn/Zosyn in 2014. Doesn't believe he has an allergy to PCN as he has taken it previously, according to wife. Allergic interstitial nephritis to Unasyn/Zosyn in 2014. Doesn't believe he has an allergy to PCN as he has taken it previously, according to wife.   Other Anaphylaxis and Other (See Comments)    Pt. And wife state that he had a reaction to "some" antibiotic but they do not know what the name of it was.They are are very apprehensive about him receiving any antibiotic. Zosyn, Clindamycin, & Vancomycin (all started together unsure which caused reaction 07/18/2013) 07/30/19- Allergic interstitial nephritis to Unasyn/zosyn in 2014 needing renal biopsy  Pt. And wife state that he had a reaction to "some" antibiotic but they do not know what the name of it was.They are are very apprehensive about him receiving any antibiotic. Piperacillin Sod-tazobactam So Other reaction(s): Kidney Disorder Pt. And wife state that he had a reaction to "some" antibiotic but they do not know what the name of it was.They are are very apprehensive about him receiving any antibiotic.  Pt.  And wife state that he had a reaction to "some" antibiotic but they do not know what the name of it was.They are are very apprehensive about him receiving any antibiotic. Piperacillin Sod-tazobactam So Other reaction(s): Kidney Disorder Pt. And wife state that he had a reaction to "some" antibiotic but they do not know what the name of it was.They are are very apprehensive about him receiving any antibiotic.   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 Doesn't believe he has an allergy to PCN as he has taken it previously, according to wife.      Review of Systems negative except from HPI and PMH  Physical Exam BP 100/66 (BP Location: Right Arm, Patient Position: Sitting, Cuff Size: Normal)    Pulse 81    Ht 6\' 1"  (1.854 m)    Wt 255 lb (115.7 kg)    SpO2 95%    BMI 33.64 kg/m  Well developed and well nourished in no acute distress HENT normal Neck supple with JVP-flat Clear Device pocket well healed; without hematoma or erythema.  There is no tethering  Regular rate and rhythm, no  gallop No  murmur Abd-soft with active BS No Clubbing cyanosis  edema Skin-warm and dry A & Oriented  Grossly normal sensory and motor function  ECG sinus at 81 Interval 17/10/41 ST-T changes inferolaterally Unchanged since at least 2/21     Assessment and  Plan Ischemic cardiomyopathy   Congestive heart failure-chronic-systolic-class IIb   Ventricular tachycardia-nonsustained  Implantable defibrillator-[employer] Jude (DOI 11/20)      Renal insufficiency grade 4  Syncope   Sleep disturbance question depression  Orthostatic Hypotension  No interval ventricular tachycardia.  We will continue beta-blocker; however, because of orthostasis and twice daily carvedilol we will switch him to daily metoprolol succinate and have him take it at nighttime  For his cardiomyopathy, we will have him take his Imdur also at  nighttime  Orthostatic hypotension is an issue.  He has not responded to compressive strategies.  Does not have systemic symptoms to suggest autonomic failure although the lack of response of his heart rate is concerning in this regard.  Likely related  to diabetic neuropathy but  Device function is normal

## 2021-10-28 NOTE — Patient Instructions (Addendum)
Medication Instructions:  - Your physician has recommended you make the following change in your medication:   1) STOP coreg (carvedilol)  2) START Toprol (metoprolol succinate) 25 mg: - take 1 tablet by mouth ONCE daily at BEDTIME  3) CHANGE Imdur (isosorbide) 30 mg: - take 1 tablet by mouth ONCE daily at BEDTIME  *If you need a refill on your cardiac medications before your next appointment, please call your pharmacy*   Lab Work: - none ordered  If you have labs (blood work) drawn today and your tests are completely normal, you will receive your results only by: MyChart Message (if you have MyChart) OR A paper copy in the mail If you have any lab test that is abnormal or we need to change your treatment, we will call you to review the results.   Testing/Procedures: - none ordered   Follow-Up: At [facility], you and your health needs are our priority.  As part of our continuing mission to provide you with exceptional heart care, we have created designated Provider Care Teams.  These Care Teams include your primary Cardiologist (physician) and Advanced Practice Providers (APPs -  Physician Assistants and Nurse Practitioners) who all work together to provide you with the care you need, when you need it.  We recommend signing up for the patient portal called "MyChart".  Sign up information is provided on this After Visit Summary.  MyChart is used to connect with patients for Virtual Visits (Telemedicine).  Patients are able to view lab/test results, encounter notes, upcoming appointments, etc.  Non-urgent messages can be sent to your provider as well.   To learn more about what you can do with MyChart, go to [email or web address].    Your next appointment:   1 year(s)  The format for your next appointment:   In Person  Provider:   Virl Axe, MD    Other Instructions N/a

## 2021-11-24 DIAGNOSIS — E113513 Type 2 diabetes mellitus with proliferative diabetic retinopathy with macular edema, bilateral: Secondary | ICD-10-CM | POA: Diagnosis not present

## 2021-11-28 DIAGNOSIS — H3589 Other specified retinal disorders: Secondary | ICD-10-CM | POA: Diagnosis not present

## 2021-11-28 DIAGNOSIS — E119 Type 2 diabetes mellitus without complications: Secondary | ICD-10-CM | POA: Diagnosis not present

## 2021-11-28 DIAGNOSIS — E113511 Type 2 diabetes mellitus with proliferative diabetic retinopathy with macular edema, right eye: Secondary | ICD-10-CM | POA: Diagnosis not present

## 2021-12-17 DIAGNOSIS — B351 Tinea unguium: Secondary | ICD-10-CM | POA: Diagnosis not present

## 2021-12-17 DIAGNOSIS — L851 Acquired keratosis [keratoderma] palmaris et plantaris: Secondary | ICD-10-CM | POA: Diagnosis not present

## 2021-12-17 DIAGNOSIS — E1142 Type 2 diabetes mellitus with diabetic polyneuropathy: Secondary | ICD-10-CM | POA: Diagnosis not present

## 2022-01-01 ENCOUNTER — Ambulatory Visit (INDEPENDENT_AMBULATORY_CARE_PROVIDER_SITE_OTHER): Payer: Medicare HMO

## 2022-01-01 DIAGNOSIS — I255 Ischemic cardiomyopathy: Secondary | ICD-10-CM | POA: Diagnosis not present

## 2022-01-01 DIAGNOSIS — I5022 Chronic systolic (congestive) heart failure: Secondary | ICD-10-CM

## 2022-01-01 LAB — CUP PACEART REMOTE DEVICE CHECK
Battery Remaining Longevity: 97 mo
Battery Remaining Percentage: 79 %
Battery Voltage: 2.99 V
Brady Statistic RV Percent Paced: 0 %
Date Time Interrogation Session: 20230216020145
HighPow Impedance: 65 Ohm
Implantable Lead Implant Date: 20201118
Implantable Lead Location: 753860
Implantable Pulse Generator Implant Date: 20201118
Lead Channel Impedance Value: 350 Ohm
Lead Channel Pacing Threshold Amplitude: 0.75 V
Lead Channel Pacing Threshold Pulse Width: 0.3 ms
Lead Channel Sensing Intrinsic Amplitude: 11.4 mV
Lead Channel Setting Pacing Amplitude: 2.5 V
Lead Channel Setting Pacing Pulse Width: 0.3 ms
Lead Channel Setting Sensing Sensitivity: 0.5 mV
Pulse Gen Serial Number: 111013097

## 2022-01-06 NOTE — Progress Notes (Signed)
Remote ICD transmission.   

## 2022-01-09 DIAGNOSIS — H35371 Puckering of macula, right eye: Secondary | ICD-10-CM | POA: Diagnosis not present

## 2022-01-26 DIAGNOSIS — E113513 Type 2 diabetes mellitus with proliferative diabetic retinopathy with macular edema, bilateral: Secondary | ICD-10-CM | POA: Diagnosis not present

## 2022-02-04 DIAGNOSIS — E113591 Type 2 diabetes mellitus with proliferative diabetic retinopathy without macular edema, right eye: Secondary | ICD-10-CM | POA: Diagnosis not present

## 2022-02-04 DIAGNOSIS — I132 Hypertensive heart and chronic kidney disease with heart failure and with stage 5 chronic kidney disease, or end stage renal disease: Secondary | ICD-10-CM | POA: Diagnosis not present

## 2022-02-04 DIAGNOSIS — E1122 Type 2 diabetes mellitus with diabetic chronic kidney disease: Secondary | ICD-10-CM | POA: Diagnosis not present

## 2022-02-04 DIAGNOSIS — I509 Heart failure, unspecified: Secondary | ICD-10-CM | POA: Diagnosis not present

## 2022-02-04 DIAGNOSIS — I255 Ischemic cardiomyopathy: Secondary | ICD-10-CM | POA: Diagnosis not present

## 2022-02-04 DIAGNOSIS — I2510 Atherosclerotic heart disease of native coronary artery without angina pectoris: Secondary | ICD-10-CM | POA: Diagnosis not present

## 2022-02-04 DIAGNOSIS — I252 Old myocardial infarction: Secondary | ICD-10-CM | POA: Diagnosis not present

## 2022-02-04 DIAGNOSIS — N186 End stage renal disease: Secondary | ICD-10-CM | POA: Diagnosis not present

## 2022-02-04 DIAGNOSIS — H35371 Puckering of macula, right eye: Secondary | ICD-10-CM | POA: Diagnosis not present

## 2022-02-19 ENCOUNTER — Other Ambulatory Visit: Payer: Self-pay | Admitting: Cardiovascular Disease

## 2022-02-19 DIAGNOSIS — N184 Chronic kidney disease, stage 4 (severe): Secondary | ICD-10-CM | POA: Diagnosis not present

## 2022-02-19 DIAGNOSIS — Z89422 Acquired absence of other left toe(s): Secondary | ICD-10-CM | POA: Diagnosis not present

## 2022-02-19 DIAGNOSIS — E1122 Type 2 diabetes mellitus with diabetic chronic kidney disease: Secondary | ICD-10-CM | POA: Diagnosis not present

## 2022-02-19 DIAGNOSIS — I25810 Atherosclerosis of coronary artery bypass graft(s) without angina pectoris: Secondary | ICD-10-CM | POA: Diagnosis not present

## 2022-02-19 DIAGNOSIS — I5022 Chronic systolic (congestive) heart failure: Secondary | ICD-10-CM | POA: Diagnosis not present

## 2022-02-19 DIAGNOSIS — I2510 Atherosclerotic heart disease of native coronary artery without angina pectoris: Secondary | ICD-10-CM | POA: Diagnosis not present

## 2022-02-19 DIAGNOSIS — Z6833 Body mass index (BMI) 33.0-33.9, adult: Secondary | ICD-10-CM | POA: Diagnosis not present

## 2022-02-19 DIAGNOSIS — Z951 Presence of aortocoronary bypass graft: Secondary | ICD-10-CM | POA: Diagnosis not present

## 2022-02-19 DIAGNOSIS — Z794 Long term (current) use of insulin: Secondary | ICD-10-CM | POA: Diagnosis not present

## 2022-02-26 DIAGNOSIS — Z794 Long term (current) use of insulin: Secondary | ICD-10-CM | POA: Diagnosis not present

## 2022-02-26 DIAGNOSIS — E1122 Type 2 diabetes mellitus with diabetic chronic kidney disease: Secondary | ICD-10-CM | POA: Diagnosis not present

## 2022-02-26 DIAGNOSIS — Z951 Presence of aortocoronary bypass graft: Secondary | ICD-10-CM | POA: Diagnosis not present

## 2022-02-26 DIAGNOSIS — I120 Hypertensive chronic kidney disease with stage 5 chronic kidney disease or end stage renal disease: Secondary | ICD-10-CM | POA: Diagnosis not present

## 2022-02-26 DIAGNOSIS — Z0000 Encounter for general adult medical examination without abnormal findings: Secondary | ICD-10-CM | POA: Diagnosis not present

## 2022-02-26 DIAGNOSIS — N185 Chronic kidney disease, stage 5: Secondary | ICD-10-CM | POA: Diagnosis not present

## 2022-02-26 DIAGNOSIS — Z89422 Acquired absence of other left toe(s): Secondary | ICD-10-CM | POA: Diagnosis not present

## 2022-02-26 DIAGNOSIS — I25810 Atherosclerosis of coronary artery bypass graft(s) without angina pectoris: Secondary | ICD-10-CM | POA: Diagnosis not present

## 2022-02-26 DIAGNOSIS — Z1389 Encounter for screening for other disorder: Secondary | ICD-10-CM | POA: Diagnosis not present

## 2022-03-17 DIAGNOSIS — E113213 Type 2 diabetes mellitus with mild nonproliferative diabetic retinopathy with macular edema, bilateral: Secondary | ICD-10-CM | POA: Diagnosis not present

## 2022-04-02 ENCOUNTER — Ambulatory Visit (INDEPENDENT_AMBULATORY_CARE_PROVIDER_SITE_OTHER): Payer: Medicare HMO

## 2022-04-02 DIAGNOSIS — I255 Ischemic cardiomyopathy: Secondary | ICD-10-CM

## 2022-04-02 LAB — CUP PACEART REMOTE DEVICE CHECK
Battery Remaining Longevity: 95 mo
Battery Remaining Percentage: 77 %
Battery Voltage: 2.99 V
Brady Statistic RV Percent Paced: 0 %
Date Time Interrogation Session: 20230518030025
HighPow Impedance: 75 Ohm
Implantable Lead Implant Date: 20201118
Implantable Lead Location: 753860
Implantable Pulse Generator Implant Date: 20201118
Lead Channel Impedance Value: 390 Ohm
Lead Channel Pacing Threshold Amplitude: 0.75 V
Lead Channel Pacing Threshold Pulse Width: 0.3 ms
Lead Channel Sensing Intrinsic Amplitude: 11.4 mV
Lead Channel Setting Pacing Amplitude: 2.5 V
Lead Channel Setting Pacing Pulse Width: 0.3 ms
Lead Channel Setting Sensing Sensitivity: 0.5 mV
Pulse Gen Serial Number: 111013097

## 2022-04-03 NOTE — Progress Notes (Signed)
Date:  04/06/2022   ID:  [name] [name], DOB [birth year], MRN [id number]  Patient Location:  [place] [address]   Provider location:   [facility], [place] office  PCP:  Tracie Harrier, MD  Cardiologist:  Patsy Baltimore  Chief Complaint  Patient presents with   6 month follow up     "Doing well." Medications reviewed by the patient verbally.     History of Present Illness:    [name] [name] is a 62 y.o. male  past medical history of diabetes, poorly controlled with poor diet, finances limiting medications hyperlipidemia,  coronary artery disease with bypass surgery x4 at the end of June 2015 with Dr. Cyndia Bent, orthostatic hypotension  EF at first <20%, Hospitalization November 2019 for acute on chronic systolic CHF ejection fraction 30 to 35%, nonsustained VT on monitors  Syncope,  EF 30 to 35% in 06/2019 ICD for VT Ejection fraction 40 to 45% in June 2022 who presents for routine follow-up Of his coronary artery disease, history of bypass  LOV 11/22 In follow-up today reports his blood pressure has been running high restart amlodipine 5 BID on his own, seemed better controlled  Recent testing reviewed from [facility] Had echocardiogram performed LVEF is visually estimated at 40-45% on 05/12/21  Myoview at [facility] 10/22 Nuclear findings: There is a large, moderately severe, minimally  reversible perfusion defect involving the apical inferior, mid inferior,  basal inferior, apical lateral and mid inferolateral segments. This is  consistent with scar and possible subtle ischemia.  no change from 2019, minimal ischemia  Other issues to discuss Leg weakness, can not walk far, leg get tired  Stress at home, wife with medical issues, recovering from breast cancer,required surgery  Not on HD yet, cr 4.3, GFR 14, reports he was little bit dehydrated that day  On ozempic, poor diet Labs reviewed HBA1C 9.2, reports some dietary  noncompliance HCT 41 Total chol 112, LDL 67  Denies angina, active  No regular exercise  No palpitations, no ICD shock Downloads reviewed, rare short run nonsustained VT  EKG personally reviewed by myself on todays visit NSr rate 83 bpm, consider old anterior MI,old inferior mi  Past medical history reviewed  hospitalization October 2020, syncope Hypoxia on arrival in the setting of CHF  concern for sustained VT on telemetry EP unable to evaluate patient on an inpatient basis -LifeVest has been placed  Ejection fraction 30 to 35% previous MI, known ischemic   Had significant diuresis during his hospital course -Recommend torsemide 40 daily 3 pound weight gain with take torsemide 40 twice daily  Medication changes madehydralazine up to '50mg'$  3 times daily, isosorbide 30 twice daily, continue carvedilol   Echo 06/29/2019 EF of 30-35%.   Echo  10/05/2018  EF of 30 to 35%, diffuse hypokinesis   nuclear stress test on 10/07/2018 that showed a large region of fixed inferior and inferior lateral wall perfusion defect consistent with previous MI , EF was estimated at 36%.    Event Monitor Normal sinus rhythm avg HR of 81 bpm. ---8 Ventricular Tachycardia runs occurred, the run with the fastest interval lasting 4 beats with a max rate of 200 bpm,  the longest lasting 17 beats with an avg rate of 120 bpm.  ----7 Supraventricular Tachycardia runs occurred,  the run with the fastest interval lasting 5 beats with a max rate of 148 bpm,  the longest lasting 13 beats with an avg  rate of 110 bpm.     history in October 2014 of MRSA, requiring amputation of several of his toes, severe renal dysfunction at that time felt secondary to antibiotics with subsequent improvement of his renal function. Prior echocardiogram September 2014 showing ejection fraction less than 20% ejection fraction was 30-35% by echo 08/02/2014   cardiac catheterization may 2015 showing 70% left main disease, 70%  diagonal #2 disease, 80% proximal circumflex disease, 80% mid circumflex disease, 99% proximal RCA disease, 99% PL branch disease, ejection fraction 40%   Past Medical History:  Diagnosis Date   Anemia    CAD (coronary artery disease)    a. 04/2014 CABG x 4 (LIMA->LAD, VG->Diag, VG->OM, VG->PDA).   Chronic systolic CHF (congestive heart failure) ([place])    a. 07/2014 Echo: EF 30-35%.   CKD (chronic kidney disease), stage III ([place])    a. 12/2015 Creat 1.8.   Diabetic foot ulcers ([place])    a. left foot 2nd digit ant 5 th digit 05/03/14; b. 08/2014 s/p amputation of toes on L foot.   Dysrhythmia    Hypercholesterolemia    a. 12/2015 TC 116, TG 154, HDL 24, LDL 61-->Atorvastatin 40.   Hypertension    Hypertensive heart disease    Ischemic cardiomyopathy    a. 07/2013 Echo: EF 20%; b. 07/2014 Echo: EF 30-35%, diff HK, Gr1 DD, mildly dil LA, nl PASP.   Myocardial infarction ([place])    Neuropathy in diabetes ([place])    Open wound    foot   Orthostatic hypotension    PAD (peripheral artery disease) ([place])    Peripheral vascular disease (HCC)    Type II diabetes mellitus ([place])    a. 12/2015 HbA1c = 13.9.   Past Surgical History:  Procedure Laterality Date   ACHILLES TENDON SURGERY Right 01/22/2017   Procedure: ACHILLES LENGTHENING/KIDNER/TAL/Teno achilles lengthening;  Surgeon: Samara Deist, DPM;  Location: [facility] ORS;  Service: Podiatry;  Laterality: Right;   AMPUTATION Right 08/19/2020   Procedure: AMPUTATION RAY-Right Partial 5th Ray;  Surgeon: Caroline More, DPM;  Location: [facility] ORS;  Service: Podiatry;  Laterality: Right;   CARDIAC CATHETERIZATION     03/2014   CATARACT EXTRACTION     COLONOSCOPY WITH PROPOFOL N/A 07/09/2021   Procedure: COLONOSCOPY WITH PROPOFOL;  Surgeon: Toledo, Benay Pike, MD;  Location: [facility] ENDOSCOPY;  Service: Gastroenterology;  Laterality: N/A;  IDDM   CORONARY ARTERY BYPASS GRAFT N/A 05/07/2014   Procedure: CORONARY ARTERY BYPASS GRAFTING (CABG);  Surgeon: Gaye Pollack,  MD;  Location: [place];  Service: Open Heart Surgery;  Laterality: N/A;  Times 4 using left internal mammary artery and endoscopically harvested right saphenous vein   DIALYSIS/PERMA CATHETER INSERTION N/A 08/04/2019   Procedure: DIALYSIS/PERMA CATHETER INSERTION;  Surgeon: Katha Cabal, MD;  Location: [place] CV LAB;  Service: Cardiovascular;  Laterality: N/A;   DIALYSIS/PERMA CATHETER REMOVAL N/A 08/08/2019   Procedure: DIALYSIS/PERMA CATHETER REMOVAL;  Surgeon: Katha Cabal, MD;  Location: [place] CV LAB;  Service: Cardiovascular;  Laterality: N/A;   EYE SURGERY     ICD IMPLANT N/A 10/04/2019   Procedure: ICD IMPLANT;  Surgeon: Deboraha Sprang, MD;  Location: [place] CV LAB;  Service: Cardiovascular;  Laterality: N/A;   INTRAOPERATIVE TRANSESOPHAGEAL ECHOCARDIOGRAM N/A 05/07/2014   Procedure: INTRAOPERATIVE TRANSESOPHAGEAL ECHOCARDIOGRAM;  Surgeon: Gaye Pollack, MD;  Location: [place] OR;  Service: Open Heart Surgery;  Laterality: N/A;   left foot osteomyelitis and wound after surgery     TOE AMPUTATION  Left 3 and 4 toes   TONSILECTOMY/ADENOIDECTOMY WITH MYRINGOTOMY        Allergies:   Ampicillin-sulbactam sodium, Other, and Zosyn [piperacillin sod-tazobactam so]   Social History   Tobacco Use   Smoking status: Never   Smokeless tobacco: Never  Vaping Use   Vaping Use: Never used  Substance Use Topics   Alcohol use: No   Drug use: No     Current Outpatient Medications on File Prior to Visit  Medication Sig Dispense Refill   acetaminophen (TYLENOL) 325 MG tablet Take by mouth every 6 (six) hours as needed for mild pain or moderate pain.     allopurinol (ZYLOPRIM) 100 MG tablet Take 100 mg by mouth daily.     aspirin EC 81 MG tablet Take 81 mg by mouth daily.     atorvastatin (LIPITOR) 40 MG tablet TAKE 1 TABLET AT BEDTIME 90 tablet 0   Cyanocobalamin (VITAMIN B 12 PO) Take 1,000 mcg by mouth at bedtime.      ferrous sulfate 325 (65 FE) MG tablet Take  325 mg by mouth daily.     glimepiride (AMARYL) 4 MG tablet Take 1 tablet by mouth 2 (two) times daily.     HYDROcodone-acetaminophen (NORCO/VICODIN) 5-325 MG tablet Take 1 tablet by mouth daily as needed for moderate pain.     insulin aspart (NOVOLOG) 100 UNIT/ML injection Inject 0-9 Units into the skin 3 (three) times daily with meals. (Patient taking differently: Inject 0-9 Units into the skin 3 (three) times daily with meals. Sliding Scale) 10 mL 11   insulin NPH Human (NOVOLIN N) 100 UNIT/ML injection Inject 20 Units into the skin in the morning and at bedtime.     isosorbide mononitrate (IMDUR) 30 MG 24 hr tablet Take 1 tablet (30 mg) by mouth once daily at bedtime 90 tablet 3   metoprolol succinate (TOPROL XL) 25 MG 24 hr tablet Take 1 tablet (25 mg) by mouth once daily at bedtime 90 tablet 3   Multiple Vitamin (MULTIVITAMIN) capsule Take 1 capsule by mouth daily.     nitroGLYCERIN (NITROSTAT) 0.4 MG SL tablet Place 1 tablet (0.4 mg total) under the tongue every 5 (five) minutes as needed for chest pain. 25 tablet 1   OZEMPIC, 0.25 OR 0.5 MG/DOSE, 2 MG/1.5ML SOPN Inject 0.5 mg into the skin.     sodium bicarbonate 650 MG tablet Take 1,300 mg by mouth 2 (two) times daily.      torsemide (DEMADEX) 20 MG tablet Take 1 tablet (20 mg total) by mouth daily. 90 tablet 3   zolpidem (AMBIEN) 5 MG tablet TAKE 1 TABLET NIGHTLY AS NEEDED FOR SLEEP AS DIRECTED     No current facility-administered medications on file prior to visit.     Family Hx: The patient's family history includes Diabetes type II in an other family member; Heart disease in his father; Hypertension in an other family member; Kidney disease in an other family member; Ovarian cancer in his mother.  ROS:   Please see the history of present illness.    Review of Systems  Constitutional: Negative.        Weight gain  HENT: Negative.    Respiratory: Negative.    Cardiovascular: Negative.   Gastrointestinal: Negative.    Musculoskeletal: Negative.   Neurological: Negative.   Psychiatric/Behavioral: Negative.    All other systems reviewed and are negative.   Labs/Other Tests and Data Reviewed:    Recent Labs: No results found for requested labs within  last 8760 hours.   Recent Lipid Panel No results found for: CHOL, TRIG, HDL, CHOLHDL, LDLCALC, LDLDIRECT  Wt Readings from Last 3 Encounters:  04/06/22 259 lb 2 oz (117.5 kg)  10/28/21 255 lb (115.7 kg)  10/06/21 263 lb 2 oz (119.4 kg)     Exam:    Vital Signs: Vital signs may also be detailed in the HPI BP (!) 142/74 (BP Location: Left Arm, Patient Position: Sitting, Cuff Size: Normal)   Pulse 83   Ht '6\' 1"'$  (1.854 m)   Wt 259 lb 2 oz (117.5 kg)   SpO2 99%   BMI 34.19 kg/m   Constitutional:  oriented to person, place, and time. No distress.  HENT:  Head: Grossly normal Eyes:  no discharge. No scleral icterus.  Neck: No JVD, no carotid bruits  Cardiovascular: Regular rate and rhythm, no murmurs appreciated Pulmonary/Chest: Clear to auscultation bilaterally, no wheezes or rails Abdominal: Soft.  no distension.  no tenderness.  Musculoskeletal: Normal range of motion Neurological:  normal muscle tone. Coordination normal. No atrophy Skin: Skin warm and dry Psychiatric: normal affect, pleasant   ASSESSMENT & PLAN:    Problem List Items Addressed This Visit       Cardiology Problems   Chronic systolic heart failure (HCC) (Chronic)   Relevant Orders   EKG 12-Lead   NSVT (nonsustained ventricular tachycardia) (HCC)   Relevant Orders   EKG 12-Lead   CAD (coronary artery disease)   Ischemic cardiomyopathy - Primary   Relevant Orders   EKG 12-Lead   Hypertensive heart disease   Orthostatic hypotension   Hyperlipidemia   Other Visit Diagnoses     ICD (implantable cardioverter-defibrillator) in place       Relevant Orders   EKG 12-Lead   CKD (chronic kidney disease), stage IV (HCC)       Claudication in peripheral vascular  disease ([place])       Relevant Orders   VAS [place] ABI WITH/WO TBI     Ischemic cardiomyopathy coronary disease, CABG, large region of infarct seen on stress test 2019, and again in 2022 at [place] history nonsustained VT Denies anginal symptoms For hypertension recommend he increase isosorbide back to 30 mg daily as he was only taking 15 mg, also increase metoprolol succinate up to 50 daily  CAD with stable angin Stress test at [facility], unchanged from testing here 2019 Currently with no symptoms of angina. No further workup at this time. Continue current medication regimen.  Poor control diabetes type 2 with complications No longer working with endocrine A1c within 9 Back on Ozempic, previously had good success with weight loss  Ventricular tachycardia Has ICD, followed by EP Rare epsiodes, short Increase metoprolol succinate up to 50 daily given hypertension  ESRD Not on HD yet, GFR 14  Claudication High risk of PAD, reports having fatigue in his legs when he is walking, some discomfort We will order lower extremity arterial Doppler   Total encounter time more than 30 minutes  Greater than 50% was spent in counseling and coordination of care with the patient   Signed, Ida Rogue, MD  [facility] [address], [place], [place] [address]

## 2022-04-06 ENCOUNTER — Ambulatory Visit: Payer: Medicare HMO | Admitting: Cardiovascular Disease

## 2022-04-06 ENCOUNTER — Encounter: Payer: Self-pay | Admitting: Cardiovascular Disease

## 2022-04-06 VITALS — BP 142/74 | HR 83 | Ht 73.0 in | Wt 259.1 lb

## 2022-04-06 DIAGNOSIS — I255 Ischemic cardiomyopathy: Secondary | ICD-10-CM | POA: Diagnosis not present

## 2022-04-06 DIAGNOSIS — N184 Chronic kidney disease, stage 4 (severe): Secondary | ICD-10-CM

## 2022-04-06 DIAGNOSIS — I5022 Chronic systolic (congestive) heart failure: Secondary | ICD-10-CM | POA: Diagnosis not present

## 2022-04-06 DIAGNOSIS — I4729 Other ventricular tachycardia: Secondary | ICD-10-CM | POA: Diagnosis not present

## 2022-04-06 DIAGNOSIS — E782 Mixed hyperlipidemia: Secondary | ICD-10-CM

## 2022-04-06 DIAGNOSIS — I110 Hypertensive heart disease with heart failure: Secondary | ICD-10-CM

## 2022-04-06 DIAGNOSIS — I951 Orthostatic hypotension: Secondary | ICD-10-CM | POA: Diagnosis not present

## 2022-04-06 DIAGNOSIS — Z95810 Presence of automatic (implantable) cardiac defibrillator: Secondary | ICD-10-CM

## 2022-04-06 DIAGNOSIS — I739 Peripheral vascular disease, unspecified: Secondary | ICD-10-CM

## 2022-04-06 DIAGNOSIS — I25118 Atherosclerotic heart disease of native coronary artery with other forms of angina pectoris: Secondary | ICD-10-CM

## 2022-04-06 NOTE — Patient Instructions (Addendum)
Medication Instructions:  STOP the amlodipine  Please increase the metoprolol succinate up to 50 mg daily Please increase the isosorbide up to 30 mg daily  If you need a refill on your cardiac medications before your next appointment, please call your pharmacy.   Lab work: No new labs needed  Testing/Procedures:  Your physician has requested that you have an ankle brachial index (ABI). During this test an ultrasound and blood pressure cuff are used to evaluate the arteries that supply the arms and legs with blood. Allow thirty minutes for this exam. There are no restrictions or special instructions.   Follow-Up: At [facility], you and your health needs are our priority.  As part of our continuing mission to provide you with exceptional heart care, we have created designated Provider Care Teams.  These Care Teams include your primary Cardiologist (physician) and Advanced Practice Providers (APPs -  Physician Assistants and Nurse Practitioners) who all work together to provide you with the care you need, when you need it.  You will need a follow up appointment in 6 months  Providers on your designated Care Team:   Murray Hodgkins, NP Christell Faith, PA-C [place] Kathlen Mody, [place]  COVID-19 Vaccine Information can be found at: [email or web address].uk For questions related to vaccine distribution or appointments, please email [email or web address] or call [phone].

## 2022-04-07 DIAGNOSIS — D225 Melanocytic nevi of trunk: Secondary | ICD-10-CM | POA: Diagnosis not present

## 2022-04-07 DIAGNOSIS — D485 Neoplasm of uncertain behavior of skin: Secondary | ICD-10-CM | POA: Diagnosis not present

## 2022-04-07 DIAGNOSIS — L821 Other seborrheic keratosis: Secondary | ICD-10-CM | POA: Diagnosis not present

## 2022-04-07 DIAGNOSIS — C44329 Squamous cell carcinoma of skin of other parts of face: Secondary | ICD-10-CM | POA: Diagnosis not present

## 2022-04-14 NOTE — Progress Notes (Signed)
Remote ICD transmission.   

## 2022-04-28 DIAGNOSIS — E113513 Type 2 diabetes mellitus with proliferative diabetic retinopathy with macular edema, bilateral: Secondary | ICD-10-CM | POA: Diagnosis not present

## 2022-05-06 DIAGNOSIS — I120 Hypertensive chronic kidney disease with stage 5 chronic kidney disease or end stage renal disease: Secondary | ICD-10-CM | POA: Diagnosis not present

## 2022-05-06 DIAGNOSIS — I739 Peripheral vascular disease, unspecified: Secondary | ICD-10-CM | POA: Diagnosis not present

## 2022-05-06 DIAGNOSIS — N184 Chronic kidney disease, stage 4 (severe): Secondary | ICD-10-CM | POA: Diagnosis not present

## 2022-05-06 DIAGNOSIS — I4729 Other ventricular tachycardia: Secondary | ICD-10-CM | POA: Diagnosis not present

## 2022-05-06 DIAGNOSIS — I5022 Chronic systolic (congestive) heart failure: Secondary | ICD-10-CM | POA: Diagnosis not present

## 2022-05-06 DIAGNOSIS — I25810 Atherosclerosis of coronary artery bypass graft(s) without angina pectoris: Secondary | ICD-10-CM | POA: Diagnosis not present

## 2022-05-06 DIAGNOSIS — E1122 Type 2 diabetes mellitus with diabetic chronic kidney disease: Secondary | ICD-10-CM | POA: Diagnosis not present

## 2022-05-12 ENCOUNTER — Other Ambulatory Visit: Payer: Self-pay | Admitting: Cardiovascular Disease

## 2022-05-12 DIAGNOSIS — I739 Peripheral vascular disease, unspecified: Secondary | ICD-10-CM

## 2022-05-13 DIAGNOSIS — C44329 Squamous cell carcinoma of skin of other parts of face: Secondary | ICD-10-CM | POA: Diagnosis not present

## 2022-05-28 DIAGNOSIS — N185 Chronic kidney disease, stage 5: Secondary | ICD-10-CM | POA: Diagnosis not present

## 2022-05-28 DIAGNOSIS — E1022 Type 1 diabetes mellitus with diabetic chronic kidney disease: Secondary | ICD-10-CM | POA: Diagnosis not present

## 2022-05-28 DIAGNOSIS — I248 Other forms of acute ischemic heart disease: Secondary | ICD-10-CM | POA: Diagnosis not present

## 2022-05-28 DIAGNOSIS — N179 Acute kidney failure, unspecified: Secondary | ICD-10-CM | POA: Diagnosis not present

## 2022-05-28 DIAGNOSIS — I25810 Atherosclerosis of coronary artery bypass graft(s) without angina pectoris: Secondary | ICD-10-CM | POA: Diagnosis not present

## 2022-05-28 DIAGNOSIS — I255 Ischemic cardiomyopathy: Secondary | ICD-10-CM | POA: Diagnosis not present

## 2022-05-28 DIAGNOSIS — D631 Anemia in chronic kidney disease: Secondary | ICD-10-CM | POA: Diagnosis not present

## 2022-05-28 DIAGNOSIS — I132 Hypertensive heart and chronic kidney disease with heart failure and with stage 5 chronic kidney disease, or end stage renal disease: Secondary | ICD-10-CM | POA: Diagnosis not present

## 2022-05-28 DIAGNOSIS — I2510 Atherosclerotic heart disease of native coronary artery without angina pectoris: Secondary | ICD-10-CM | POA: Diagnosis not present

## 2022-05-28 DIAGNOSIS — Z452 Encounter for adjustment and management of vascular access device: Secondary | ICD-10-CM | POA: Diagnosis not present

## 2022-05-28 DIAGNOSIS — I472 Ventricular tachycardia, unspecified: Secondary | ICD-10-CM | POA: Diagnosis not present

## 2022-05-28 DIAGNOSIS — R0989 Other specified symptoms and signs involving the circulatory and respiratory systems: Secondary | ICD-10-CM | POA: Diagnosis not present

## 2022-05-28 DIAGNOSIS — Z01810 Encounter for preprocedural cardiovascular examination: Secondary | ICD-10-CM | POA: Diagnosis not present

## 2022-05-28 DIAGNOSIS — E11319 Type 2 diabetes mellitus with unspecified diabetic retinopathy without macular edema: Secondary | ICD-10-CM | POA: Diagnosis not present

## 2022-05-28 DIAGNOSIS — I5043 Acute on chronic combined systolic (congestive) and diastolic (congestive) heart failure: Secondary | ICD-10-CM | POA: Diagnosis not present

## 2022-05-28 DIAGNOSIS — R0602 Shortness of breath: Secondary | ICD-10-CM | POA: Diagnosis not present

## 2022-05-28 DIAGNOSIS — N186 End stage renal disease: Secondary | ICD-10-CM | POA: Diagnosis not present

## 2022-05-28 DIAGNOSIS — I5023 Acute on chronic systolic (congestive) heart failure: Secondary | ICD-10-CM | POA: Diagnosis not present

## 2022-05-28 DIAGNOSIS — I493 Ventricular premature depolarization: Secondary | ICD-10-CM | POA: Diagnosis not present

## 2022-05-28 DIAGNOSIS — I491 Atrial premature depolarization: Secondary | ICD-10-CM | POA: Diagnosis not present

## 2022-05-28 DIAGNOSIS — E8770 Fluid overload, unspecified: Secondary | ICD-10-CM | POA: Diagnosis not present

## 2022-05-28 DIAGNOSIS — I130 Hypertensive heart and chronic kidney disease with heart failure and stage 1 through stage 4 chronic kidney disease, or unspecified chronic kidney disease: Secondary | ICD-10-CM | POA: Diagnosis not present

## 2022-05-28 DIAGNOSIS — Z951 Presence of aortocoronary bypass graft: Secondary | ICD-10-CM | POA: Diagnosis not present

## 2022-06-01 ENCOUNTER — Encounter: Payer: Self-pay | Admitting: Internal Medicine

## 2022-06-02 DIAGNOSIS — N185 Chronic kidney disease, stage 5: Secondary | ICD-10-CM | POA: Diagnosis not present

## 2022-06-05 DIAGNOSIS — Z992 Dependence on renal dialysis: Secondary | ICD-10-CM | POA: Diagnosis not present

## 2022-06-05 DIAGNOSIS — N2581 Secondary hyperparathyroidism of renal origin: Secondary | ICD-10-CM | POA: Diagnosis not present

## 2022-06-05 DIAGNOSIS — N186 End stage renal disease: Secondary | ICD-10-CM | POA: Diagnosis not present

## 2022-06-08 DIAGNOSIS — N186 End stage renal disease: Secondary | ICD-10-CM | POA: Diagnosis not present

## 2022-06-08 DIAGNOSIS — Z992 Dependence on renal dialysis: Secondary | ICD-10-CM | POA: Diagnosis not present

## 2022-06-08 DIAGNOSIS — N2581 Secondary hyperparathyroidism of renal origin: Secondary | ICD-10-CM | POA: Diagnosis not present

## 2022-06-10 DIAGNOSIS — N2581 Secondary hyperparathyroidism of renal origin: Secondary | ICD-10-CM | POA: Diagnosis not present

## 2022-06-10 DIAGNOSIS — Z992 Dependence on renal dialysis: Secondary | ICD-10-CM | POA: Diagnosis not present

## 2022-06-10 DIAGNOSIS — N186 End stage renal disease: Secondary | ICD-10-CM | POA: Diagnosis not present

## 2022-06-12 DIAGNOSIS — Z992 Dependence on renal dialysis: Secondary | ICD-10-CM | POA: Diagnosis not present

## 2022-06-12 DIAGNOSIS — N186 End stage renal disease: Secondary | ICD-10-CM | POA: Diagnosis not present

## 2022-06-12 DIAGNOSIS — N2581 Secondary hyperparathyroidism of renal origin: Secondary | ICD-10-CM | POA: Diagnosis not present

## 2022-06-15 DIAGNOSIS — Z992 Dependence on renal dialysis: Secondary | ICD-10-CM | POA: Diagnosis not present

## 2022-06-15 DIAGNOSIS — N186 End stage renal disease: Secondary | ICD-10-CM | POA: Diagnosis not present

## 2022-06-15 DIAGNOSIS — N2581 Secondary hyperparathyroidism of renal origin: Secondary | ICD-10-CM | POA: Diagnosis not present

## 2022-07-27 ENCOUNTER — Ambulatory Visit (INDEPENDENT_AMBULATORY_CARE_PROVIDER_SITE_OTHER): Payer: Medicare PPO

## 2022-07-27 DIAGNOSIS — I255 Ischemic cardiomyopathy: Secondary | ICD-10-CM

## 2022-07-28 LAB — CUP PACEART REMOTE DEVICE CHECK
Battery Remaining Longevity: 92 mo
Battery Remaining Percentage: 75 %
Battery Voltage: 2.98 V
Brady Statistic RV Percent Paced: 1 %
Date Time Interrogation Session: 20230910200654
HighPow Impedance: 74 Ohm
Implantable Lead Implant Date: 20201118
Implantable Lead Location: 753860
Implantable Pulse Generator Implant Date: 20201118
Lead Channel Impedance Value: 390 Ohm
Lead Channel Pacing Threshold Amplitude: 0.75 V
Lead Channel Pacing Threshold Pulse Width: 0.3 ms
Lead Channel Sensing Intrinsic Amplitude: 11.4 mV
Lead Channel Setting Pacing Amplitude: 2.5 V
Lead Channel Setting Pacing Pulse Width: 0.3 ms
Lead Channel Setting Sensing Sensitivity: 0.5 mV
Pulse Gen Serial Number: 111013097

## 2022-08-13 NOTE — Progress Notes (Signed)
Remote ICD transmission.   

## 2022-08-17 HISTORY — PX: CORONARY ANGIOPLASTY WITH STENT PLACEMENT: SHX49

## 2022-10-07 ENCOUNTER — Ambulatory Visit: Payer: Medicare HMO | Admitting: Nurse Practitioner

## 2022-10-26 ENCOUNTER — Ambulatory Visit (INDEPENDENT_AMBULATORY_CARE_PROVIDER_SITE_OTHER): Payer: Medicare PPO

## 2022-10-26 DIAGNOSIS — I255 Ischemic cardiomyopathy: Secondary | ICD-10-CM | POA: Diagnosis not present

## 2022-10-27 LAB — CUP PACEART REMOTE DEVICE CHECK
Battery Remaining Longevity: 89 mo
Battery Remaining Percentage: 73 %
Battery Voltage: 2.99 V
Brady Statistic RV Percent Paced: 1 %
Date Time Interrogation Session: 20231211010048
HighPow Impedance: 66 Ohm
Implantable Lead Connection Status: 753985
Implantable Lead Implant Date: 20201118
Implantable Lead Location: 753860
Implantable Pulse Generator Implant Date: 20201118
Lead Channel Impedance Value: 380 Ohm
Lead Channel Pacing Threshold Amplitude: 0.75 V
Lead Channel Pacing Threshold Pulse Width: 0.3 ms
Lead Channel Sensing Intrinsic Amplitude: 11.4 mV
Lead Channel Setting Pacing Amplitude: 2.5 V
Lead Channel Setting Pacing Pulse Width: 0.3 ms
Lead Channel Setting Sensing Sensitivity: 0.5 mV
Pulse Gen Serial Number: 111013097
Zone Setting Status: 755011

## 2022-12-03 NOTE — Progress Notes (Signed)
Remote ICD transmission.   

## 2023-01-06 ENCOUNTER — Other Ambulatory Visit: Payer: Self-pay | Admitting: Cardiovascular Disease

## 2023-01-07 ENCOUNTER — Telehealth: Payer: Self-pay | Admitting: *Deleted

## 2023-01-07 NOTE — Patient Outreach (Signed)
  Care Coordination   01/07/2023 Name: [name] [name] MRN: [id number] DOB: [birth year]   Care Coordination Outreach Attempts:  An unsuccessful telephone outreach was attempted today to offer the patient information about available care coordination services as a benefit of their health plan.   Follow Up Plan:  Additional outreach attempts will be made to offer the patient care coordination information and services.   Encounter Outcome:  No Answer   Care Coordination Interventions:  No, not indicated    Valente David, RN, MSN, [facility] Care Management Care Management Coordinator [phone]

## 2023-01-11 ENCOUNTER — Telehealth: Payer: Self-pay | Admitting: *Deleted

## 2023-01-11 NOTE — Patient Outreach (Signed)
  Care Coordination   Initial Visit Note   01/11/2023 Name: [name] [name] MRN: [id number] DOB: 18-Sep-1961  [name] [name] is a 62 y.o. year old male who sees Tracie Harrier, MD for primary care. I spoke with  [name] [name] by phone today.  What matters to the patients health and wellness today?  State he is currently on HD, request call back.     SDOH assessments and interventions completed:  No     Care Coordination Interventions:  No, not indicated   Follow up plan: Follow up call scheduled for in the next 3 days    Encounter Outcome:  Pt. Request to Call [place], RN, MSN, [place] Management Care Management Coordinator [phone]

## 2023-01-14 ENCOUNTER — Telehealth: Payer: Self-pay | Admitting: *Deleted

## 2023-01-14 NOTE — Patient Outreach (Signed)
  Care Coordination   01/14/2023 Name: JOVANIE GUEBARA MRN: [id number] DOB: [birth year]   Care Coordination Outreach Attempts:  A third unsuccessful outreach was attempted today to offer the patient with information about available care coordination services as a benefit of their health plan.   Follow Up Plan:  No further outreach attempts will be made at this time. We have been unable to contact the patient to offer or enroll patient in care coordination services  Encounter Outcome:  No Answer   Care Coordination Interventions:  No, not indicated    Valente David, RN, MSN, [facility] Care Management Care Management Coordinator [phone]

## 2023-01-25 ENCOUNTER — Ambulatory Visit (INDEPENDENT_AMBULATORY_CARE_PROVIDER_SITE_OTHER): Payer: Medicare HMO

## 2023-01-25 DIAGNOSIS — I255 Ischemic cardiomyopathy: Secondary | ICD-10-CM

## 2023-01-25 DIAGNOSIS — I5022 Chronic systolic (congestive) heart failure: Secondary | ICD-10-CM

## 2023-01-27 LAB — CUP PACEART REMOTE DEVICE CHECK
Battery Remaining Longevity: 86 mo
Battery Remaining Percentage: 71 %
Battery Voltage: 2.98 V
Brady Statistic RV Percent Paced: 1 %
Date Time Interrogation Session: 20240311030042
HighPow Impedance: 63 Ohm
Implantable Lead Connection Status: 753985
Implantable Lead Implant Date: 20201118
Implantable Lead Location: 753860
Implantable Pulse Generator Implant Date: 20201118
Lead Channel Impedance Value: 350 Ohm
Lead Channel Pacing Threshold Amplitude: 0.75 V
Lead Channel Pacing Threshold Pulse Width: 0.3 ms
Lead Channel Sensing Intrinsic Amplitude: 11.4 mV
Lead Channel Setting Pacing Amplitude: 2.5 V
Lead Channel Setting Pacing Pulse Width: 0.3 ms
Lead Channel Setting Sensing Sensitivity: 0.5 mV
Pulse Gen Serial Number: 111013097
Zone Setting Status: 755011

## 2023-02-17 ENCOUNTER — Telehealth: Payer: Self-pay | Admitting: Cardiovascular Disease

## 2023-02-17 ENCOUNTER — Other Ambulatory Visit: Payer: Self-pay | Admitting: *Deleted

## 2023-02-17 ENCOUNTER — Telehealth: Payer: Self-pay | Admitting: *Deleted

## 2023-02-17 NOTE — Telephone Encounter (Signed)
   Pre-operative Risk Assessment    Patient Name: [name] [name]  DOB: 1961-08-30 MRN: [id number]      Request for Surgical Clearance    Procedure:   Vitrectomy (Retina) Surgery  Date of Surgery:  Clearance 04/15/23                                 Surgeon:  Dr. Onalee Hua Surgeon's Group or Practice Name:  [facility] Phone number:  [phone] Fax number:  [phone]   Type of Clearance Requested:   - Medical  - pharmacy - device integration   Type of Anesthesia:   Nerve block and versed   Additional requests/questions:   PA Eliezer Lofts called stating they need clearance on blood thinners that the pt is currently taking, but she wants clearance on medical and his device as well.  PA Eliezer Lofts did state that this surgery is very time sensitive.   Signed, Verlin Dike   02/17/2023, 2:37 PM

## 2023-02-17 NOTE — Telephone Encounter (Signed)
   Name: [name] [name]  DOB: 26-Dec-1960  MRN: [id number]  Primary Cardiologist: Ida Rogue, MD   Preoperative team, please contact this patient and set up a phone call appointment for further preoperative risk assessment. Please obtain consent and complete medication review. Thank you for your help.  I confirm that guidance regarding antiplatelet and oral anticoagulation therapy has been completed and, if necessary, noted below.  Regarding ASA therapy, we recommend continuation of ASA throughout the perioperative period.  However, if the surgeon feels that cessation of ASA is required in the perioperative period, it may be stopped 5-7 days prior to surgery with a plan to resume it as soon as felt to be feasible from a surgical standpoint in the post-operative period.   Patient will also need device interrogation for scheduled procedure on 04/15/2023  Thank you  Mable Fill, Marissa Nestle, NP 02/17/2023, 3:25 PM [place]

## 2023-02-17 NOTE — Telephone Encounter (Signed)
Pt has been scheduled for tele pre op appt 03/16/23 @ 10 am. Med rec and consent are done. When reviewing medications with the pt, he tells me that he is was prescribed Praulent 02/15/23 by the [place]. I have added this to his medication list. Pt tells me that by the time he has his eye procedure he will not be on Praulent. Pt thanked me for the call and the help.

## 2023-02-17 NOTE — Telephone Encounter (Signed)
Pt has been scheduled for tele pre op appt 03/16/23 @ 10 am. Med rec and consent are done. When reviewing medications with the pt, he tells me that he is was prescribed Praulent 02/15/23 by the [place]. I have added this to his medication list. Pt tells me that by the time he has his eye procedure he will not be on Praulent. Pt thanked me for the call and the help.     Patient Consent for Virtual Visit        [name] [name] has provided verbal consent on 02/17/2023 for a virtual visit (video or telephone).   CONSENT FOR VIRTUAL VISIT FOR:  [name] [name]  By participating in this virtual visit I agree to the following:  I hereby voluntarily request, consent and authorize [facility] and its employed or contracted physicians, physician assistants, nurse practitioners or other licensed health care professionals (the Practitioner), to provide me with telemedicine health care services (the "Services") as deemed necessary by the treating Practitioner. I acknowledge and consent to receive the Services by the Practitioner via telemedicine. I understand that the telemedicine visit will involve communicating with the Practitioner through live audiovisual communication technology and the disclosure of certain medical information by electronic transmission. I acknowledge that I have been given the opportunity to request an in-person assessment or other available alternative prior to the telemedicine visit and am voluntarily participating in the telemedicine visit.  I understand that I have the right to withhold or withdraw my consent to the use of telemedicine in the course of my care at any time, without affecting my right to future care or treatment, and that the Practitioner or I may terminate the telemedicine visit at any time. I understand that I have the right to inspect all information obtained and/or recorded in the course of the telemedicine visit and may receive copies of available information for a  reasonable fee.  I understand that some of the potential risks of receiving the Services via telemedicine include:  Delay or interruption in medical evaluation due to technological equipment failure or disruption; Information transmitted may not be sufficient (e.g. poor resolution of images) to allow for appropriate medical decision making by the Practitioner; and/or  In rare instances, security protocols could fail, causing a breach of personal health information.  Furthermore, I acknowledge that it is my responsibility to provide information about my medical history, conditions and care that is complete and accurate to the best of my ability. I acknowledge that Practitioner's advice, recommendations, and/or decision may be based on factors not within their control, such as incomplete or inaccurate data provided by me or distortions of diagnostic images or specimens that may result from electronic transmissions. I understand that the practice of medicine is not an exact science and that Practitioner makes no warranties or guarantees regarding treatment outcomes. I acknowledge that a copy of this consent can be made available to me via my patient portal ([place]), or I can request a printed copy by calling the office of [facility].    I understand that my insurance will be billed for this visit.   I have read or had this consent read to me. I understand the contents of this consent, which adequately explains the benefits and risks of the Services being provided via telemedicine.  I have been provided ample opportunity to ask questions regarding this consent and the Services and have had my questions answered to my satisfaction. I give my informed  consent for the services to be provided through the use of telemedicine in my medical care

## 2023-02-18 NOTE — Telephone Encounter (Signed)
Patient is overdue and needs apt so we can complete device clearance.

## 2023-02-24 ENCOUNTER — Encounter: Payer: Self-pay | Admitting: Cardiology

## 2023-02-24 ENCOUNTER — Ambulatory Visit: Payer: Medicare HMO | Attending: Cardiology | Admitting: Cardiology

## 2023-02-24 ENCOUNTER — Other Ambulatory Visit: Payer: Self-pay | Admitting: Cardiology

## 2023-02-24 VITALS — BP 122/60 | HR 87 | Ht 73.0 in | Wt 250.0 lb

## 2023-02-24 DIAGNOSIS — Z95810 Presence of automatic (implantable) cardiac defibrillator: Secondary | ICD-10-CM | POA: Diagnosis not present

## 2023-02-24 DIAGNOSIS — I5022 Chronic systolic (congestive) heart failure: Secondary | ICD-10-CM | POA: Diagnosis not present

## 2023-02-24 DIAGNOSIS — I4729 Other ventricular tachycardia: Secondary | ICD-10-CM

## 2023-02-24 MED ORDER — METOPROLOL SUCCINATE ER 25 MG PO TB24: 25.0000 mg | ORAL_TABLET | Freq: Every evening | ORAL | 1 refills | Status: AC

## 2023-02-24 NOTE — Telephone Encounter (Signed)
Pt was seen in the office today for device check with Sherie Don, NP. It was discovered that the pt is actually being followed by cardiology with Dr. Bethanie Dicker with [facility]. Per NP today the cardiac clearance needs to come from Dr. Marina Goodell office. NP will provide the device clearance but will not provide the cardiac clearance as surgeon's office needs to reach out to [facility]. Pt is aware that we have cancelled the tele visit that we had in place for 03/16/23 and clearance will need to come from [facility].

## 2023-02-24 NOTE — Progress Notes (Signed)
Cardiology Office Note Date:  02/24/2023  Patient ID:  [name], [name] 1961/08/22, MRN [id number] PCP:  System, Provider Not In  Cardiologist:  [name] Nordmann, MD Electrophysiologist: [name] Manges, MD   Chief Complaint: 1 year follow-up ICD, past due  History of Present Illness: [name] [name] is a 62 y.o. male with PMH notable for CAD s/p CABG (2016), HFrecEF, though still mid-range, ICM, NSVT, s/p ICD, ESRD on HD, T2DM, PAD; seen today for [name] Manges, MD for routine electrophysiology followup.  Last saw Dr. Graciela Husbands 10/2021, was having some orthostatic hypotension, coreg > toprol nightly.  He has been seen extensively by [facility] cardiology, most recently 09/2022 in clinic. S/p PCI to LCx lesion 08/2022 - no ischemic symptoms, pos stress test as part of kidney tx eval. Since starting HD, has been unable to tolerate GDMT d/t hypotension. Was euvolemic at appt, planning to repeat echo in 6 mon to further eval for kidney tx.  Lately, he has tolerated HD much better, has full sessions. Continues to have some orthostatic hypotension, but resolves much quicker and far less often than when he was starting HD.  He does continue to make urine.   Patient is planning to have retina surgery at [facility], req device clearance   he denies chest pain, palpitations, dyspnea, PND, orthopnea, nausea, vomiting, dizziness, syncope, edema, weight gain, or early satiety.    Device Information: St. Jude single chamber ICD, imp 09/2019; dx ICM, NSVT   Past Medical History:  Diagnosis Date   Anemia    CAD (coronary artery disease)    a. 04/2014 CABG x 4 (LIMA->LAD, VG->Diag, VG->OM, VG->PDA).   Chronic systolic CHF (congestive heart failure)    a. 07/2014 Echo: EF 30-35%.   CKD (chronic kidney disease), stage III    a. 12/2015 Creat 1.8.   Diabetic foot ulcers    a. left foot 2nd digit ant 5 th digit 05/03/14; b. 08/2014 s/p amputation of toes on L foot.   Dysrhythmia     Hypercholesterolemia    a. 12/2015 TC 116, TG 154, HDL 24, LDL 61-->Atorvastatin 40.   Hypertension    Hypertensive heart disease    Ischemic cardiomyopathy    a. 07/2013 Echo: EF 20%; b. 07/2014 Echo: EF 30-35%, diff HK, Gr1 DD, mildly dil [name], nl PASP.   Myocardial infarction    Neuropathy in diabetes    Open wound    foot   Orthostatic hypotension    PAD (peripheral artery disease)    Peripheral vascular disease    Type II diabetes mellitus    a. 12/2015 HbA1c = 13.9.    Past Surgical History:  Procedure Laterality Date   ACHILLES TENDON SURGERY Right 01/22/2017   Procedure: ACHILLES LENGTHENING/KIDNER/TAL/Teno achilles lengthening;  Surgeon: Gwyneth Revels, DPM;  Location: [facility] ORS;  Service: Podiatry;  Laterality: Right;   AMPUTATION Right 08/19/2020   Procedure: AMPUTATION RAY-Right Partial 5th Ray;  Surgeon: Rosetta Posner, DPM;  Location: [facility] ORS;  Service: Podiatry;  Laterality: Right;   CARDIAC CATHETERIZATION     03/2014   CATARACT EXTRACTION     COLONOSCOPY WITH PROPOFOL N/A 07/09/2021   Procedure: COLONOSCOPY WITH PROPOFOL;  Surgeon: Toledo, Boykin Nearing, MD;  Location: [facility] ENDOSCOPY;  Service: Gastroenterology;  Laterality: N/A;  IDDM   CORONARY ANGIOPLASTY WITH STENT PLACEMENT  08/17/2022   CORONARY ARTERY BYPASS GRAFT N/A 05/07/2014   Procedure: CORONARY ARTERY BYPASS GRAFTING (CABG);  Surgeon: Alleen Borne, MD;  Location: [facility] OR;  Service:  Open Heart Surgery;  Laterality: N/A;  Times 4 using left internal mammary artery and endoscopically harvested right saphenous vein   DIALYSIS/PERMA CATHETER INSERTION N/A 08/04/2019   Procedure: DIALYSIS/PERMA CATHETER INSERTION;  Surgeon: Renford DillsSchnier, Gregory G, MD;  Location: [facility] INVASIVE CV LAB;  Service: Cardiovascular;  Laterality: N/A;   DIALYSIS/PERMA CATHETER REMOVAL N/A 08/08/2019   Procedure: DIALYSIS/PERMA CATHETER REMOVAL;  Surgeon: Renford DillsSchnier, Gregory G, MD;  Location: [facility] INVASIVE CV LAB;  Service: Cardiovascular;  Laterality:  N/A;   EYE SURGERY     ICD IMPLANT N/A 10/04/2019   Procedure: ICD IMPLANT;  Surgeon: Duke SalviaKlein, Steven C, MD;  [facility];  Service: Cardiovascular;  Laterality: N/A;   INTRAOPERATIVE TRANSESOPHAGEAL ECHOCARDIOGRAM N/A 05/07/2014   Procedure: INTRAOPERATIVE TRANSESOPHAGEAL ECHOCARDIOGRAM;  Surgeon: Alleen BorneBryan K Bartle, MD;  Location: [facility] OR;  Service: Open Heart Surgery;  Laterality: N/A;   left foot osteomyelitis and wound after surgery     TOE AMPUTATION     Left 3 and 4 toes   TONSILECTOMY/ADENOIDECTOMY WITH MYRINGOTOMY      Current Outpatient Medications  Medication Instructions   acetaminophen (TYLENOL) 325 MG tablet Oral, Every 6 hours PRN   allopurinol (ZYLOPRIM) 100 mg, Oral, Daily   aspirin EC 81 mg, Oral, Daily   atorvastatin (LIPITOR) 40 MG tablet TAKE 1 TABLET AT BEDTIME   furosemide (LASIX) 80 mg, Oral, 2 times daily   HYDROcodone-acetaminophen (NORCO/VICODIN) 5-325 MG tablet 1 tablet, Daily PRN   insulin aspart (NOVOLOG) 0-9 Units, Subcutaneous, 3 times daily with meals   insulin NPH Human (NOVOLIN N) 30 Units, Subcutaneous, 2 times daily   metoprolol succinate (TOPROL XL) 25 mg, Oral, Nightly   Multiple Vitamin (MULTIVITAMIN) capsule 1 capsule, Oral, Daily   nitroGLYCERIN (NITROSTAT) 0.4 mg, Sublingual, Every 5 min PRN   prasugrel (EFFIENT) 10 mg, Oral, Daily   zolpidem (AMBIEN) 5 MG tablet TAKE 1 TABLET NIGHTLY AS NEEDED FOR SLEEP AS DIRECTED    Social History:  The patient  reports that he has never smoked. He has never used smokeless tobacco. He reports that he does not drink alcohol and does not use drugs.   Family History:   The patient's family history includes Diabetes type II in an other family member; Heart disease in his father; Hypertension in an other family member; Kidney disease in an other family member; Ovarian cancer in his mother.  ROS:  Please see the history of present illness. All other systems are reviewed and otherwise negative.    PHYSICAL EXAM:  VS:  BP 122/60 (BP Location: Right Arm, Patient Position: Sitting, Cuff Size: Normal)   Pulse 87   Ht 6\' 1"  (1.854 m)   Wt 250 lb (113.4 kg)   SpO2 98%   BMI 32.98 kg/m  BMI: Body mass index is 32.98 kg/m.  GEN- The patient is well appearing, alert and oriented x 3 today.   Lungs- Clear to ausculation bilaterally, normal work of breathing.  Heart- Regular rate and rhythm, no murmurs, rubs or gallops Extremities- No peripheral edema, warm, dry Skin-  device pocket well-healed   Device interrogation done today and reviewed by myself:  Battery good Lead threshold, impedence, sensing stable  Occasional NSVT episodes All episodes less than 11 seconds No changes made today  EKG is ordered. Personal review of EKG from today shows:  NSR w PVC, rate 87bpm  Recent Labs: No results found for requested labs within last 365 days.  No results found for requested labs within last 365 days.  CrCl cannot be calculated (Patient's most recent lab result is older than the maximum 21 days allowed.).   Wt Readings from Last 3 Encounters:  02/24/23 250 lb (113.4 kg)  04/06/22 259 lb 2 oz (117.5 kg)  10/28/21 255 lb (115.7 kg)     Additional studies reviewed include: Previous EP, cardiology notes, external notes, procedure notes.   TTE, 09/17/2022 ([facility] in [place])   1. The left ventricle is normal in size with normal wall thickness.    2. The left ventricular systolic function is mildly decreased, LVEF is  visually estimated at 45-50%.    3. There is thinning and akinesis involving the basal inferior segment(s).    4. The right ventricle is normal in size, with low normal systolic function.   PCI, 08/20/2022 [facility] in [place]) - PCI was performed of the 90% LCx lesion using Xience (drug eluting stent) [2.5x28, post-dilated to 3.44mm]. Excellent angiographic result with TIMI 3 flow.   LHC, 07/14/2022 ([facility] in [place]) CONCLUSIONS:  - Significant 3-vessel coronary artery disease with  patent LIMA-LAD and  SVG-RCA. Occluded SVG-D1, SVG-OM  RECOMMENDATIONS:  - TR band protocol  - Optimize medical Rx  - Follow up in outpatient cardiology clinic to determine plan for  revascularization versus medical therapy prior to kidney transplantation   ASSESSMENT AND PLAN:  #) HFmrEF #) ESRD on HD Previously EF as low as 20%, most recent echo at [facility] shows improvement to 45-50%. Euvolemic on exam today Volume by HD, though still makes urine Tolerating HD sessions better Planning for repeat echo at [facility] Ongoing kidney tx evaluation per [facility]   #) NSVT s/p ICD in situ #) PVC Device functioning well, see paceart for details Brief VT episodes by device Asymptomatic, no palpitations Will re-trial toprol 25mg  nightly --Continue to monitor hypotension, if hypotension worsens, stop BB   #) surgical clearance Cleared from a device perspective for upcoming retina surgery   Current medicines are reviewed at length with the patient today.   The patient does not have concerns regarding his medicines.  The following changes were made today:   START toprol 25mg  nightly  Labs/ tests ordered today include:  Orders Placed This Encounter  Procedures   EKG 12-Lead     Disposition: Follow up with Dr. Graciela Husbands in in 12 months Discussed transferring/consolidating EP care to group closer to home or [facility]. Patient will consider this and let us know if he wishes to transfer care  Signed, Sherie Don, NP  02/24/23  3:49 PM  Electrophysiology [facility]

## 2023-02-24 NOTE — Patient Instructions (Signed)
Medication Instructions:  RESTART metoprolol succinate 25 mg by mouth at night  *If you need a refill on your cardiac medications before your next appointment, please call your pharmacy*   Lab Work: No labs ordered  If you have labs (blood work) drawn today and your tests are completely normal, you will receive your results only by: MyChart Message (if you have MyChart) OR A paper copy in the mail If you have any lab test that is abnormal or we need to change your treatment, we will call you to review the results.   Testing/Procedures: No testing ordered  Follow-Up: At [facility], you and your health needs are our priority.  As part of our continuing mission to provide you with exceptional heart care, we have created designated Provider Care Teams.  These Care Teams include your primary Cardiologist (physician) and Advanced Practice Providers (APPs -  Physician Assistants and Nurse Practitioners) who all work together to provide you with the care you need, when you need it.  We recommend signing up for the patient portal called "MyChart".  Sign up information is provided on this After Visit Summary.  MyChart is used to connect with patients for Virtual Visits (Telemedicine).  Patients are able to view lab/test results, encounter notes, upcoming appointments, etc.  Non-urgent messages can be sent to your provider as well.   To learn more about what you can do with MyChart, go to [email or web address].    Your next appointment:   1 year(s)  Provider:   Sherryl Manges, MD or Sherie Don, NP

## 2023-02-25 ENCOUNTER — Encounter: Payer: Self-pay | Admitting: Internal Medicine

## 2023-02-25 LAB — CUP PACEART INCLINIC DEVICE CHECK
Battery Remaining Longevity: 102 mo
Brady Statistic RV Percent Paced: 0 %
Date Time Interrogation Session: 20221213105200
HighPow Impedance: 75.375
Implantable Lead Connection Status: 753985
Implantable Lead Implant Date: 20201118
Implantable Lead Location: 753860
Implantable Pulse Generator Implant Date: 20201118
Lead Channel Impedance Value: 437.5 Ohm
Lead Channel Pacing Threshold Amplitude: 0.75 V
Lead Channel Pacing Threshold Amplitude: 0.75 V
Lead Channel Pacing Threshold Pulse Width: 0.3 ms
Lead Channel Pacing Threshold Pulse Width: 0.3 ms
Lead Channel Sensing Intrinsic Amplitude: 12 mV
Lead Channel Setting Pacing Amplitude: 2.5 V
Lead Channel Setting Pacing Pulse Width: 0.3 ms
Lead Channel Setting Sensing Sensitivity: 0.5 mV
Pulse Gen Serial Number: 111013097
Zone Setting Status: 755011

## 2023-02-25 NOTE — Progress Notes (Signed)
PERIOPERATIVE PRESCRIPTION FOR IMPLANTED CARDIAC DEVICE PROGRAMMING  Patient Information: Name:  [name] [name]  DOB:  December 14, 1960  MRN:  [id number]   Planned Procedure:  Retina Surgery Surgeon:  Dr. Alisia Ferrari Date of Procedure: 04/15/2023  Device Information:  Clinic EP Physician:  Sherryl Manges, MD   Device Type:  Defibrillator Manufacturer and Phone #:  St. Jude/Abbott: [phone] Pacemaker Dependent?:  No. Date of Last Device Check:  02/24/2023 Normal Device Function?:  Yes.    Electrophysiologist's Recommendations:  Have magnet available. Provide continuous ECG monitoring when magnet is used or reprogramming is to be performed.  Procedure should not interfere with device function.  No device programming or magnet placement needed.  Per Device Clinic Standing Orders, Wiliam Ke, RN  1:06 PM 02/25/2023

## 2023-03-08 NOTE — Progress Notes (Signed)
Remote ICD transmission.   

## 2023-03-16 ENCOUNTER — Telehealth: Payer: Medicare HMO

## 2023-04-26 ENCOUNTER — Ambulatory Visit (INDEPENDENT_AMBULATORY_CARE_PROVIDER_SITE_OTHER): Payer: Medicare HMO

## 2023-04-26 DIAGNOSIS — I255 Ischemic cardiomyopathy: Secondary | ICD-10-CM

## 2023-04-26 LAB — CUP PACEART REMOTE DEVICE CHECK
Battery Remaining Longevity: 84 mo
Battery Remaining Percentage: 69 %
Battery Voltage: 2.98 V
Brady Statistic RV Percent Paced: 1 %
Date Time Interrogation Session: 20240610024424
HighPow Impedance: 66 Ohm
Implantable Lead Connection Status: 753985
Implantable Lead Implant Date: 20201118
Implantable Lead Location: 753860
Implantable Pulse Generator Implant Date: 20201118
Lead Channel Impedance Value: 360 Ohm
Lead Channel Pacing Threshold Amplitude: 0.75 V
Lead Channel Pacing Threshold Pulse Width: 0.3 ms
Lead Channel Sensing Intrinsic Amplitude: 11.4 mV
Lead Channel Setting Pacing Amplitude: 2.5 V
Lead Channel Setting Pacing Pulse Width: 0.3 ms
Lead Channel Setting Sensing Sensitivity: 0.5 mV
Pulse Gen Serial Number: 111013097
Zone Setting Status: 755011

## 2023-05-18 NOTE — Progress Notes (Signed)
Remote ICD transmission.   

## 2023-05-27 ENCOUNTER — Telehealth: Payer: Self-pay

## 2023-05-27 NOTE — Telephone Encounter (Signed)
Returned call to Pt's wife.  She would like Pt's remote transmission to be faxed to [facility].  Information faxed.  Wife thankful for call.

## 2023-05-27 NOTE — Telephone Encounter (Signed)
Pt wife returning nurses call.

## 2023-05-27 NOTE — Telephone Encounter (Signed)
Received Point of Service transmission from [facility] ED.  Normal device function.  VF event 7/10 @ 11:36, duration 18sec, HR 375, HV therapy delivered converting briefly to regular VS with return to VF.  Second event 11:37, duration 18sec, HR 266, ATP delivered x1 while charging with no change, HV therapy 36J, converting to regular AP Presenting regular VS.  Left message for Pt wife advising if she needed any information for [facility] regarding her husband to please call device clinic as we are happy to assist.  Per CareEverywhere Pt is currently hospitalized.

## 2024-05-16 DEATH — deceased
